# Patient Record
Sex: Male | Born: 1955 | Race: White | Hispanic: No | Marital: Single | Smoking: Current every day smoker
Health system: Southern US, Community
[De-identification: ages and names within clinical notes are randomized; demographics above are authoritative.]

## PROBLEM LIST (undated history)

## (undated) ENCOUNTER — Emergency Department: Discharge status: Absent without leave | Payer: Medicare Other

## (undated) DIAGNOSIS — I10 Essential (primary) hypertension: Secondary | ICD-10-CM

## (undated) DIAGNOSIS — Z22322 Carrier or suspected carrier of Methicillin resistant Staphylococcus aureus: Secondary | ICD-10-CM

## (undated) DIAGNOSIS — F32A Depression, unspecified: Secondary | ICD-10-CM

## (undated) DIAGNOSIS — M5030 Other cervical disc degeneration, unspecified cervical region: Secondary | ICD-10-CM

## (undated) DIAGNOSIS — K469 Unspecified abdominal hernia without obstruction or gangrene: Secondary | ICD-10-CM

## (undated) DIAGNOSIS — K219 Gastro-esophageal reflux disease without esophagitis: Secondary | ICD-10-CM

## (undated) DIAGNOSIS — E785 Hyperlipidemia, unspecified: Secondary | ICD-10-CM

## (undated) DIAGNOSIS — E119 Type 2 diabetes mellitus without complications: Secondary | ICD-10-CM

## (undated) DIAGNOSIS — R079 Chest pain, unspecified: Secondary | ICD-10-CM

## (undated) DIAGNOSIS — M6208 Separation of muscle (nontraumatic), other site: Secondary | ICD-10-CM

## (undated) DIAGNOSIS — K224 Dyskinesia of esophagus: Secondary | ICD-10-CM

## (undated) DIAGNOSIS — I2510 Atherosclerotic heart disease of native coronary artery without angina pectoris: Secondary | ICD-10-CM

## (undated) DIAGNOSIS — G629 Polyneuropathy, unspecified: Secondary | ICD-10-CM

## (undated) DIAGNOSIS — G894 Chronic pain syndrome: Secondary | ICD-10-CM

## (undated) DIAGNOSIS — I509 Heart failure, unspecified: Secondary | ICD-10-CM

## (undated) DIAGNOSIS — J449 Chronic obstructive pulmonary disease, unspecified: Secondary | ICD-10-CM

## (undated) HISTORY — DX: Heart failure, unspecified: I50.9

## (undated) HISTORY — DX: Carrier or suspected carrier of methicillin resistant Staphylococcus aureus: Z22.322

## (undated) HISTORY — DX: Polyneuropathy, unspecified: G62.9

## (undated) HISTORY — PX: TONSILLECTOMY: SUR1361

## (undated) HISTORY — DX: Other cervical disc degeneration, unspecified cervical region: M50.30

## (undated) HISTORY — DX: Gastro-esophageal reflux disease without esophagitis: K21.9

## (undated) HISTORY — DX: Chronic pain syndrome: G89.4

## (undated) HISTORY — DX: Separation of muscle (nontraumatic), other site: M62.08

## (undated) HISTORY — DX: Dyskinesia of esophagus: K22.4

## (undated) HISTORY — DX: Hyperlipidemia, unspecified: E78.5

## (undated) HISTORY — PX: FOOT SURGERY: SHX648

## (undated) HISTORY — PX: SPINE SURGERY: SHX786

## (undated) HISTORY — PX: CHOLECYSTECTOMY: SHX55

## (undated) HISTORY — PX: NECK SURGERY: SHX720

## (undated) HISTORY — DX: Unspecified abdominal hernia without obstruction or gangrene: K46.9

## (undated) HISTORY — DX: Essential (primary) hypertension: I10

## (undated) HISTORY — DX: Atherosclerotic heart disease of native coronary artery without angina pectoris: I25.10

## (undated) HISTORY — DX: Chest pain, unspecified: R07.9

---

## 2007-01-10 HISTORY — PX: CORONARY ANGIOPLASTY WITH STENT PLACEMENT: SHX49

## 2009-01-09 DIAGNOSIS — Z22322 Carrier or suspected carrier of Methicillin resistant Staphylococcus aureus: Secondary | ICD-10-CM

## 2009-01-09 HISTORY — DX: Carrier or suspected carrier of methicillin resistant Staphylococcus aureus: Z22.322

## 2009-03-16 ENCOUNTER — Inpatient Hospital Stay (HOSPITAL_COMMUNITY): Admission: EM | Admit: 2009-03-16 | Discharge: 2009-03-18 | Payer: Self-pay | Admitting: Emergency Medicine

## 2009-03-16 ENCOUNTER — Ambulatory Visit: Payer: Self-pay | Admitting: Internal Medicine

## 2009-03-17 ENCOUNTER — Ambulatory Visit: Payer: Self-pay | Admitting: Gastroenterology

## 2009-03-22 ENCOUNTER — Emergency Department (HOSPITAL_COMMUNITY): Admission: EM | Admit: 2009-03-22 | Discharge: 2009-03-23 | Payer: Self-pay | Admitting: Emergency Medicine

## 2009-03-23 ENCOUNTER — Inpatient Hospital Stay (HOSPITAL_COMMUNITY): Admission: EM | Admit: 2009-03-23 | Discharge: 2009-03-25 | Payer: Self-pay | Admitting: Emergency Medicine

## 2009-03-24 ENCOUNTER — Encounter (INDEPENDENT_AMBULATORY_CARE_PROVIDER_SITE_OTHER): Payer: Self-pay | Admitting: Internal Medicine

## 2009-03-24 ENCOUNTER — Ambulatory Visit: Payer: Self-pay | Admitting: Physical Medicine & Rehabilitation

## 2009-03-24 ENCOUNTER — Encounter: Payer: Self-pay | Admitting: Internal Medicine

## 2009-03-28 ENCOUNTER — Emergency Department (HOSPITAL_COMMUNITY): Admission: EM | Admit: 2009-03-28 | Discharge: 2009-03-29 | Payer: Self-pay | Admitting: Emergency Medicine

## 2009-03-29 ENCOUNTER — Emergency Department (HOSPITAL_COMMUNITY): Admission: EM | Admit: 2009-03-29 | Discharge: 2009-03-29 | Payer: Self-pay | Admitting: Emergency Medicine

## 2009-03-30 ENCOUNTER — Encounter: Payer: Self-pay | Admitting: Internal Medicine

## 2009-03-30 DIAGNOSIS — I2510 Atherosclerotic heart disease of native coronary artery without angina pectoris: Secondary | ICD-10-CM | POA: Insufficient documentation

## 2009-03-30 DIAGNOSIS — Z8679 Personal history of other diseases of the circulatory system: Secondary | ICD-10-CM | POA: Insufficient documentation

## 2009-03-30 DIAGNOSIS — E785 Hyperlipidemia, unspecified: Secondary | ICD-10-CM | POA: Insufficient documentation

## 2009-04-02 ENCOUNTER — Observation Stay (HOSPITAL_COMMUNITY): Admission: EM | Admit: 2009-04-02 | Discharge: 2009-04-03 | Payer: Self-pay | Admitting: Emergency Medicine

## 2009-04-08 ENCOUNTER — Encounter (INDEPENDENT_AMBULATORY_CARE_PROVIDER_SITE_OTHER): Payer: Self-pay | Admitting: *Deleted

## 2009-04-12 ENCOUNTER — Emergency Department (HOSPITAL_COMMUNITY): Admission: EM | Admit: 2009-04-12 | Discharge: 2009-04-12 | Payer: Self-pay | Admitting: Emergency Medicine

## 2009-04-13 ENCOUNTER — Emergency Department (HOSPITAL_COMMUNITY): Admission: EM | Admit: 2009-04-13 | Discharge: 2009-04-13 | Payer: Self-pay | Admitting: Emergency Medicine

## 2009-04-14 ENCOUNTER — Inpatient Hospital Stay (HOSPITAL_COMMUNITY): Admission: EM | Admit: 2009-04-14 | Discharge: 2009-04-16 | Payer: Self-pay | Admitting: Emergency Medicine

## 2009-04-16 ENCOUNTER — Emergency Department (HOSPITAL_COMMUNITY): Admission: EM | Admit: 2009-04-16 | Discharge: 2009-04-17 | Payer: Self-pay | Admitting: Emergency Medicine

## 2009-04-17 ENCOUNTER — Ambulatory Visit: Payer: Self-pay | Admitting: Psychiatry

## 2009-04-17 ENCOUNTER — Inpatient Hospital Stay (HOSPITAL_COMMUNITY)
Admission: RE | Admit: 2009-04-17 | Discharge: 2009-04-26 | Payer: Self-pay | Source: Home / Self Care | Admitting: Psychiatry

## 2009-04-17 ENCOUNTER — Emergency Department (HOSPITAL_COMMUNITY): Admission: EM | Admit: 2009-04-17 | Discharge: 2009-04-17 | Payer: Self-pay | Admitting: Emergency Medicine

## 2009-04-17 ENCOUNTER — Other Ambulatory Visit: Payer: Self-pay | Admitting: Emergency Medicine

## 2009-05-27 ENCOUNTER — Emergency Department: Payer: Self-pay | Admitting: Emergency Medicine

## 2009-08-27 ENCOUNTER — Emergency Department: Payer: Self-pay | Admitting: Emergency Medicine

## 2009-10-05 ENCOUNTER — Ambulatory Visit: Payer: Self-pay

## 2009-10-13 ENCOUNTER — Emergency Department: Payer: Self-pay | Admitting: Emergency Medicine

## 2009-10-15 ENCOUNTER — Emergency Department: Payer: Self-pay | Admitting: Emergency Medicine

## 2009-10-30 ENCOUNTER — Ambulatory Visit: Payer: Self-pay | Admitting: Cardiovascular Disease

## 2009-10-30 ENCOUNTER — Inpatient Hospital Stay: Payer: Self-pay | Admitting: Internal Medicine

## 2009-10-31 ENCOUNTER — Encounter: Payer: Self-pay | Admitting: Cardiology

## 2009-11-11 ENCOUNTER — Encounter: Payer: Self-pay | Admitting: Cardiovascular Disease

## 2009-11-24 ENCOUNTER — Ambulatory Visit: Payer: Self-pay | Admitting: Cardiovascular Disease

## 2009-11-24 DIAGNOSIS — R0989 Other specified symptoms and signs involving the circulatory and respiratory systems: Secondary | ICD-10-CM | POA: Insufficient documentation

## 2009-11-24 DIAGNOSIS — R079 Chest pain, unspecified: Secondary | ICD-10-CM | POA: Insufficient documentation

## 2009-11-25 DIAGNOSIS — F17200 Nicotine dependence, unspecified, uncomplicated: Secondary | ICD-10-CM | POA: Insufficient documentation

## 2009-12-09 ENCOUNTER — Ambulatory Visit: Payer: Self-pay

## 2009-12-09 ENCOUNTER — Encounter: Payer: Self-pay | Admitting: Cardiovascular Disease

## 2009-12-15 ENCOUNTER — Telehealth: Payer: Self-pay | Admitting: Cardiovascular Disease

## 2010-01-09 HISTORY — PX: BACK SURGERY: SHX140

## 2010-01-26 ENCOUNTER — Telehealth: Payer: Self-pay | Admitting: Cardiovascular Disease

## 2010-01-27 ENCOUNTER — Encounter: Payer: Self-pay | Admitting: Cardiovascular Disease

## 2010-02-08 NOTE — Consult Note (Signed)
Summary[facility]ist, [facility]   [facility]   Imported By: Roderic Ovens 11/05/2009 16:11:08  _____________________________________________________________________  External Attachment:    Type:   Image     Comment:   External Document

## 2010-02-08 NOTE — Letter (Signed)
Summary: Patient [name] Biopsy Results  [facility]  [address] [place], [place] [address]   Phone: [phone]  Fax: [phone]        March 30, 2009 MRN: [id number]    Gregory Hall [address] [place], [place]  [address]    Dear [name],  I am pleased to inform you that the biopsies taken during your recent endoscopic examination did not show any evidence of cancer upon pathologic examination.  No further action is needed at this time.  Please follow-up with      your primary care physician and Dr. Alycia Rossetti for your other healthcare needs.  Please call us if you are having persistent problems or have questions about your condition that have not been fully answered at this time.  Sincerely,  Iva Boop MD, [facility]  This letter has been electronically signed by your physician.  Appended Document: Patient Notice-Endo Biopsy Results letter mailed to patient's home    Appended Document: Patient Notice-Endo Biopsy Results LETTER MAILED AGAIN WITH CORRECT ZIP.

## 2010-02-08 NOTE — Procedures (Signed)
Summary: Upper Endoscopy  Patient: Gregory Hall Note: All result statuses are Final unless otherwise noted.  Tests: (1) Upper Endoscopy (EGD)   EGD Upper Endoscopy       DONE     [place] [facility]     [address]     [place], [place]  [address]           ENDOSCOPY PROCEDURE REPORT           PATIENT:  Gregory, Hall  MR#:  [id number]     BIRTHDATE:  [birth year], 54 yrs. old  GENDER:  male           ENDOSCOPIST:  Iva Boop, MD, [facility]     Referred by:  [facility]           PROCEDURE DATE:  03/24/2009     PROCEDURE:  EGD with biopsy     ASA CLASS:  Class III     INDICATIONS:  hematemesis           MEDICATIONS:   Benadryl 12.5 mg IV, Fentanyl 100 mcg IV, Versed 8     mg IV     TOPICAL ANESTHETIC:  Cetacaine Spray           DESCRIPTION OF PROCEDURE:   After the risks benefits and     alternatives of the procedure were thoroughly explained, informed     consent was obtained.  The EG-[id number] ([id number]) endoscope was     introduced through the mouth and advanced to the second portion of     the duodenum, without limitations.  The instrument was slowly     withdrawn as the mucosa was fully examined.     <<PROCEDUREIMAGES>>           Multiple erosions were found pyloric channel Erythema in pylorus     and pre-pyloric antrum also. Multiple biopsies were obtained and     sent to pathology.  Otherwise the examination was normal.     Retroflexed views revealed no abnormalities.    The scope was then     withdrawn from the patient and the procedure completed.           COMPLICATIONS:  None           ENDOSCOPIC IMPRESSION:     1) Erosions, multiple in the pyloric channel     2) Otherwise normal examination     RECOMMENDATIONS:     Would not change therapy at this time as far as erosions are     concerned.           He has 3 main pains:     1) Spastic chest pain that is wave like and could be from the     previously diagnosed nutcracker esophagus     2)  Chest, left neck and left shoulder pains associated with     upper extremity paresthesias that could be fro C-spine problems     (do not think this has been evaluated here and it is appropriate     to do so to help avoid repeated admissions)     3) abdomional wall pain from vomiting           He cannot take NSAIDs on Plavix though he has in past. ? if     topical NSAID therapy an option if he could afford.     A low-dose tricycli agent is also a possibility but follow-up     plans  would need to be in place before starting.           He is to see Dr. Alycia Rossetti at [facility] later this month and should     do so.           Iva Boop, MD, [facility]           CC:  The Patient     Beverly [name], MD [facility])           n.     Rosalie Doctor:   Iva Boop at 03/24/2009 12:19 PM           Glennon Mac, [id number]  Note: An exclamation mark (!) indicates a result that was not dispersed into the flowsheet. Document Creation Date: 03/24/2009 12:20 PM _______________________________________________________________________  (1) Order result status: Final Collection or observation date-time: 03/24/2009 11:57 Requested date-time:  Receipt date-time:  Reported date-time:  Referring Physician:   Ordering Physician: Stan Head [phone]) Specimen Source:  Source: Launa Grill Order Number: [id number] Lab site:   Appended Document: Upper Endoscopy no recall for this fax the EGD report, dc summary and path report to Dr. Alycia Rossetti at [facility]

## 2010-02-08 NOTE — Assessment & Plan Note (Signed)
Summary: [facility]   Visit Type:  Initial Consult Primary Provider:  Dr. Lacie Scotts  CC:  F/U [facility].  He went to the hospital with right leg pain with esophageal spasms and was admitted.  He has shortness of breath and  being treated for MRSA.Marland Kitchen  History of Present Illness: 55 yo male with CAD, PCI of the LAD in 2010, repeat cath in 03/2009, hyperlipidemia, HTN, psychiatric hx, long smoking hx, recent treatment for MRSA, presenting for new patient evaluation. He was seen in consultation [facility] for chest pain 10/30/1009.  His chest pain at [facility] was felt to be noncardiac. He has a h/o of significant esophageal pathology and spasm with erosive gastritis/GERD per his report. He has not had a GI workup since he moved to the area.   He denies any further chest pain. He has been having trouble with his rash and had recent biopsy.   EKG: NSR with rate of 84bpm, T wave ABN in anterolateral and inferior leads.    Current Medications (verified): 1)  Aspir-Low 81 Mg Tbec (Aspirin) .... One Tablet Once Daily 2)  Lisinopril 5 Mg Tabs (Lisinopril) .... One Tablet Q A.m. 3)  Metoprolol Tartrate 25 Mg Tabs (Metoprolol Tartrate) .... One Tablet Two Times A Day 4)  Valium 10 Mg Tabs (Diazepam) .... One Tablet Three Times A Day 5)  Zocor 20 Mg Tabs (Simvastatin) .... One Tablet At Bedtime 6)  Benadryl 25 Mg Tabs (Diphenhydramine Hcl) .... One Tablet Every 8 Hours As Needed 7)  Nexium 40 Mg Cpdr (Esomeprazole Magnesium) .... One Tablet Two Times A Day 8)  Nitrostat 0.4 Mg Subl (Nitroglycerin) .... As Needed 9)  Doxycycline Hyclate 100 Mg Tabs (Doxycycline Hyclate) .... One Tablet  Two Times A Day For 7 Days.  Was Given A Rx For 30 Day Supply. 10)  Flexeril 5 Mg Tabs (Cyclobenzaprine Hcl) .... One Tablet Two Times A Day 11)  Zyrtec Allergy 10 Mg Tabs (Cetirizine Hcl) .... One Tablet Once Daily 12)  Neurontin 300 Mg Caps (Gabapentin) .... One Tablet Three Times A Day 13)  Lasix 20 Mg Tabs (Furosemide) ....  One Tablet As Needed 14)  Trazodone Hcl 150 Mg Tabs (Trazodone Hcl) .... One Tablet At Bedtime 15)  Celexa 20 Mg Tabs (Citalopram Hydrobromide) .... One Tablet Once Daily 16)  Amitriptyline Hcl 75 Mg Tabs (Amitriptyline Hcl) .... One Tablet At Bedtime 17)  Hydroxyzine Hcl 25 Mg Tabs (Hydroxyzine Hcl) 18)  Ventolin Hfa 108 (90 Base) Mcg/act Aers (Albuterol Sulfate) .... Two Puffs Every 6 Hours As Needed 19)  Oxycodone Hcl 10 Mg Tabs (Oxycodone Hcl) .... Two Tablets At 8 A.m. One Tablet At 2 P.m. and One Tablet 8 P.m. 20)  Triamcinolone Acetonide 0.1 % Crea (Triamcinolone Acetonide) .... Two Times A Day 21)  Hydrocortisone 2.5 % Crea (Hydrocortisone) .... Two Times A Day  Allergies (verified): No Known Drug Allergies  Past History:  Family History: Last updated: 03/30/2009 Family History of Coronary Artery Disease:   Social History: Last updated: 11/24/2009 Tobacco Use - No.  Tobacco Use - Yes. 1/2 PPD. Alcohol Use - no Drug Use - no  Risk Factors: Smoking Status: quit (03/30/2009)  Past Medical History:  CONSULTATIONS:  The patient was seen in consult by Dr. Leone Payor from   [facility].  Also Ranelle Oyster, M.D. from [facility].   -------------------------------------------------------------------------------------------------------    History of coronary artery disease status post percutaneous      transluminal coronary angioplasty    History of hypertension  History of dyslipidemia    History of chronic pain syndrome    History of nutcracker esophagus    History of gastroesophageal reflux disease   History of degenerative cervical disk disease MRSA 2011. CAD; s/p stent x 2        Past Surgical History: CAD; s/p stents x 2 in 2009 in [place], [place]. Tonsillectomy Cholecystectomy Previous right foot surgery   Social History: Tobacco Use - No.  Tobacco Use - Yes. 1/2 PPD. Alcohol Use - no Drug Use - no  Review of Systems  The patient denies  fever, weight loss, weight gain, vision loss, decreased hearing, hoarseness, chest pain, syncope, dyspnea on exertion, peripheral edema, prolonged cough, abdominal pain, incontinence, muscle weakness, depression, and enlarged lymph nodes.         rash  Vital Signs:  Patient profile:   55 year old male Height:      73 inches Weight:      213 pounds BMI:     28.20 Pulse rate:   84 / minute BP sitting:   102 / 58  (left arm) Cuff size:   regular  Vitals Entered By: Bishop Dublin, CMA (November 24, 2009 11:06 AM)  Physical Exam  General:  Well developed, well nourished, in no acute distress. Head:  normocephalic and atraumatic Neck:  Neck supple, no JVD. No masses, thyromegaly or abnormal cervical nodes. Lungs:  Clear bilaterally to auscultation and percussion. Heart:  Non-displaced PMI, chest non-tender; regular rate and rhythm, S1, S2 without murmurs, rubs or gallops. Carotid upstroke normal, 1+ bruit on the right.  Pedals normal pulses. No edema, no varicosities. Abdomen:  Bowel sounds positive; abdomen soft and non-tender without masses Msk:  Back normal, normal gait. Muscle strength and tone normal. Pulses:  pulses normal in all 4 extremities Extremities:  No clubbing or cyanosis. Neurologic:  Alert and oriented x 3. Skin:  Macular diffuse rash Psych:  Normal affect.   Impression & Recommendations:  Problem # 1:  CHEST PAIN-UNSPECIFIED (ICD-786.50) Suspect his chest discomfort could be secondary to GERD/GI related.  Possible spasm as NTG seems to help. Can not exclude small vessel disease. will start low dose imdur 15 mg daily, possibly titrating to 30 mg if tolerated.    His updated medication list for this problem includes:    Aspir-low 81 Mg Tbec (Aspirin) ..... One tablet once daily    Lisinopril 5 Mg Tabs (Lisinopril) ..... One tablet q a.m.    Metoprolol Tartrate 25 Mg Tabs (Metoprolol tartrate) ..... One tablet two times a day    Nitrostat 0.4 Mg Subl  (Nitroglycerin) .Marland Kitchen... As needed    Isosorbide Mononitrate Cr 30 Mg Xr24h-tab (Isosorbide mononitrate) .Marland Kitchen... Take 1/2 tablet once daily  Problem # 2:  CAROTID BRUIT, RIGHT (ICD-785.9) Will order a carotid ultrasound for evaluation. Bruit on the right.  Orders: Carotid Duplex (Carotid Duplex)  Problem # 3:  DYSLIPIDEMIA (ICD-272.4) Continue zocor. Goal LDL 70.  His updated medication list for this problem includes:    Zocor 20 Mg Tabs (Simvastatin) ..... One tablet at bedtime  Problem # 4:  CORONARY ARTERY DISEASE (ICD-414.00) Patent stent by cardiac cath in 03/2009. medical magagement.  His updated medication list for this problem includes:    Aspir-low 81 Mg Tbec (Aspirin) ..... One tablet once daily    Lisinopril 5 Mg Tabs (Lisinopril) ..... One tablet q a.m.    Metoprolol Tartrate 25 Mg Tabs (Metoprolol tartrate) ..... One tablet two times a day    Nitrostat  0.4 Mg Subl (Nitroglycerin) .Marland Kitchen... As needed    Isosorbide Mononitrate Cr 30 Mg Xr24h-tab (Isosorbide mononitrate) .Marland Kitchen... Take 1/2 tablet once daily  Orders: EKG w/ Interpretation (93000)  Problem # 5:  SMOKER (ICD-305.1)  We have encouraged him to continue to wean down off the cigarettes.  Explained how this could exacerbate his underlying CAD.  Patient Instructions: 1)  Your physician recommends that you schedule a follow-up appointment in: 1 year 2)  Your physician has recommended you make the following change in your medication: Start taking Isosorbide Mononitrate 30mg  1/2 tablet daily. 3)  Your physician has requested that you have a carotid duplex. This test is an ultrasound of the carotid arteries in your neck. It looks at blood flow through these arteries that supply the brain with blood. Allow one hour for this exam. There are no restrictions or special instructions. In the next 1-2 months. Prescriptions: ISOSORBIDE MONONITRATE CR 30 MG XR24H-TAB (ISOSORBIDE MONONITRATE) Take 1/2 tablet once daily  #30 x 6    Entered by:   Cloyde Reams RN   Authorized by:   Dossie Arbour MD   Signed by:   Cloyde Reams RN on 11/24/2009   Method used:   Electronically to        [facility] (retail)       [address]       [place], [place]  [address]       Ph: [phone]       Fax: [phone]   RxID:   [id number]

## 2010-02-08 NOTE — Letter (Signed)
Summary: Appointment - Missed  [facility], Main Office  [address]   [place], [place] [address]   Phone: [phone]  Fax: [phone]     April 08, 2009 MRN: [id number]   Gregory Hall [address] [place], [place]  [address]   Dear Mr. [name],  Our records indicate you missed your appointment on 03/31/2009 with Dr.  Shirlee Latch. It is very important that we reach you to reschedule this appointment. We look forward to participating in your health care needs. Please contact us at the number listed above at your earliest convenience to reschedule this appointment.     Sincerely,   [facility]igdalia [facility] Scheduling Team

## 2010-02-10 NOTE — Letter (Signed)
Summary: Clearance for medication  Clearance for medication   Imported By: Harlon Flor 01/28/2010 16:22:41  _____________________________________________________________________  External Attachment:    Type:   Image     Comment:   External Document

## 2010-02-10 NOTE — Progress Notes (Signed)
Summary: pt has question re when he can have back surgery  Phone Note Call from Patient Call back at Home Phone [phone] Call back at [phone]   Caller: Patient Reason for Call: Talk to Nurse Summary of Call: pt was told by dr Eden Emms to wait 90 days before getting his back surgery done, and he forgot to ask 90 days from what date, mrsa better has one scab left, now has scabies and being treated for that now-pls advise Initial call taken by: Glynda Jaeger,  December 15, 2009 1:36 PM  Follow-up for Phone Call        spoke with pt, dr Eden Emms had seen the pt in consult at [facility] and then he followed up with dr Raynald Kemp. the pt states the last thing dr Eden Emms said before he left is the pt would need to wait 90 days before any surgery. the pt questioned if that was because of the MRSA or the plavix. will foward for dr Eden Emms review Deliah Goody, RN  December 15, 2009 3:59 PM   Additional Follow-up for Phone Call Additional follow up Details #1::        I suspect it was because of the MRSA but not sure Dr Mariah Milling follows this patient and will need to figure this out Additional Follow-up by: Colon Branch, MD, [facility],  December 16, 2009 10:42 AM    Additional Follow-up for Phone Call Additional follow up Details #2::    pt made aware Deliah Goody, RN  December 16, 2009 3:40 PM    Appended Document: pt has question re when he can have back surgery He can stop ASA 10 days before back surgery. Is he on plavix? Not on our list? If he is on plavix, this would also need to be stopped 10 day before surgery  Appended Document: pt has question re when he can have back surgery pt notified to stop ASA 10 days prior to back surgery. pt states he is not on plavix.

## 2010-02-10 NOTE — Progress Notes (Signed)
Summary: Surgical Clearance  Phone Note Call from Patient Call back at Home Phone [phone] Call back at [phone]   Caller: Self  Call For: Gregory Hall Summary of Call: Pt is having back surgery and needs surgical clearance and stating that the pt cannot have aspirin for 10 days prior to the surgery.  Please send to Dr. Gerrit Heck at [facility]. Initial call taken by: Harlon Flor,  January 26, 2010 10:29 AM  Follow-up for Phone Call        Please advise. Follow-up by: Lanny Hurst RN,  January 26, 2010 4:05 PM  Additional Follow-up for Phone Call Additional follow up Details #1::        Ok to hold asa for 10 days. Would restart when surgery complete. Can we get a clearance letter to Dr. Gerrit Heck     Appended Document: Surgical Clearance Clearance letter sent to Dr. Gerrit Heck. /MES

## 2010-02-10 NOTE — Letter (Signed)
Summary: Clearance Letter  Architectural technologist at [facility] [address]   [place], [place] [address]   Phone: [phone]  Fax: [phone]    January 27, 2010  Re:     Gregory Hall Address:   [address] [place], [place]  [address] DOB:     09-08-1955 MRN:     [id number]   Dear Dr. Gerrit Heck,    [facility] for patient to hold Aspirin for 10 days prior to surgery, and restart when surgery completed.           Sincerely,        Dossie Arbour, MD

## 2010-02-15 ENCOUNTER — Ambulatory Visit: Payer: Self-pay | Admitting: Unknown Physician Specialty

## 2010-02-22 ENCOUNTER — Ambulatory Visit: Payer: Self-pay | Admitting: Unknown Physician Specialty

## 2010-03-30 LAB — COMPREHENSIVE METABOLIC PANEL
ALT: 10 U/L (ref 0–53)
AST: 12 U/L (ref 0–37)
Albumin: 2.9 g/dL — ABNORMAL LOW (ref 3.5–5.2)
Alkaline Phosphatase: 58 U/L (ref 39–117)
BUN: 5 mg/dL — ABNORMAL LOW (ref 6–23)
CO2: 28 mEq/L (ref 19–32)
Calcium: 8.4 mg/dL (ref 8.4–10.5)
Chloride: 103 mEq/L (ref 96–112)
Creatinine, Ser: 0.84 mg/dL (ref 0.4–1.5)
GFR calc Af Amer: >60 mL/min (ref >60)
GFR calc non Af Amer: >60 mL/min (ref >60)
Glucose, Bld: 101 mg/dL — ABNORMAL HIGH (ref 70–99)
Potassium: 4 mEq/L (ref 3.5–5.1)
Sodium: 136 mEq/L (ref 135–145)
Total Bilirubin: 0.4 mg/dL (ref 0.3–1.2)
Total Protein: 5.3 g/dL — ABNORMAL LOW (ref 6.0–8.3)

## 2010-03-30 LAB — POCT I-STAT, CHEM 8
BUN: 4 mg/dL — ABNORMAL LOW (ref 6–23)
BUN: <3 mg/dL — ABNORMAL LOW (ref 6–23)
Calcium, Ion: 1.04 mmol/L — ABNORMAL LOW (ref 1.12–1.32)
Calcium, Ion: 1.14 mmol/L (ref 1.12–1.32)
Chloride: 101 mEq/L (ref 96–112)
Chloride: 106 mEq/L (ref 96–112)
Creatinine, Ser: 0.7 mg/dL (ref 0.4–1.5)
Creatinine, Ser: 0.7 mg/dL (ref 0.4–1.5)
Glucose, Bld: 126 mg/dL — ABNORMAL HIGH (ref 70–99)
Glucose, Bld: 168 mg/dL — ABNORMAL HIGH (ref 70–99)
HCT: 42 % (ref 39.0–52.0)
HCT: 45 % (ref 39.0–52.0)
Hemoglobin: 14.3 g/dL (ref 13.0–17.0)
Hemoglobin: 15.3 g/dL (ref 13.0–17.0)
Potassium: 2.9 mEq/L — ABNORMAL LOW (ref 3.5–5.1)
Potassium: 3.2 mEq/L — ABNORMAL LOW (ref 3.5–5.1)
Sodium: 140 mEq/L (ref 135–145)
Sodium: 143 mEq/L (ref 135–145)
TCO2: 26 mmol/L (ref 0–100)
TCO2: 29 mmol/L (ref 0–100)

## 2010-03-30 LAB — RAPID URINE DRUG SCREEN, HOSP PERFORMED
Amphetamines: NOT DETECTED
Barbiturates: NOT DETECTED
Benzodiazepines: POSITIVE — AB
Cocaine: NOT DETECTED
Opiates: POSITIVE — AB
Tetrahydrocannabinol: NOT DETECTED

## 2010-03-30 LAB — BASIC METABOLIC PANEL
BUN: 8 mg/dL (ref 6–23)
CO2: 28 mEq/L (ref 19–32)
Calcium: 9 mg/dL (ref 8.4–10.5)
Chloride: 100 mEq/L (ref 96–112)
Creatinine, Ser: 1.03 mg/dL (ref 0.4–1.5)
GFR calc Af Amer: >60 mL/min (ref >60)
GFR calc non Af Amer: >60 mL/min (ref >60)
Glucose, Bld: 178 mg/dL — ABNORMAL HIGH (ref 70–99)
Potassium: 4 mEq/L (ref 3.5–5.1)
Sodium: 135 mEq/L (ref 135–145)

## 2010-03-30 LAB — DIFFERENTIAL
Basophils Absolute: 0 10*3/uL (ref 0.0–0.1)
Basophils Absolute: 0.1 10*3/uL (ref 0.0–0.1)
Basophils Relative: 0 % (ref 0–1)
Basophils Relative: 1 % (ref 0–1)
Eosinophils Absolute: 0.1 10*3/uL (ref 0.0–0.7)
Eosinophils Absolute: 0.2 10*3/uL (ref 0.0–0.7)
Eosinophils Relative: 1 % (ref 0–5)
Eosinophils Relative: 2 % (ref 0–5)
Lymphocytes Relative: 14 % (ref 12–46)
Lymphocytes Relative: 21 % (ref 12–46)
Lymphs Abs: 1.2 10*3/uL (ref 0.7–4.0)
Lymphs Abs: 2.6 10*3/uL (ref 0.7–4.0)
Monocytes Absolute: 0.6 10*3/uL (ref 0.1–1.0)
Monocytes Absolute: 1.1 10*3/uL — ABNORMAL HIGH (ref 0.1–1.0)
Monocytes Relative: 8 % (ref 3–12)
Monocytes Relative: 9 % (ref 3–12)
Neutro Abs: 6.4 10*3/uL (ref 1.7–7.7)
Neutro Abs: 8.3 10*3/uL — ABNORMAL HIGH (ref 1.7–7.7)
Neutrophils Relative %: 68 % (ref 43–77)
Neutrophils Relative %: 77 % (ref 43–77)

## 2010-03-30 LAB — POCT CARDIAC MARKERS
CKMB, poc: 1.2 ng/mL (ref 1.0–8.0)
CKMB, poc: <1 ng/mL — ABNORMAL LOW (ref 1.0–8.0)
CKMB, poc: <1 ng/mL — ABNORMAL LOW (ref 1.0–8.0)
CKMB, poc: <1 ng/mL — ABNORMAL LOW (ref 1.0–8.0)
Myoglobin, poc: 40.5 ng/mL (ref 12–200)
Myoglobin, poc: 40.5 ng/mL (ref 12–200)
Myoglobin, poc: 55.2 ng/mL (ref 12–200)
Myoglobin, poc: 90.7 ng/mL (ref 12–200)
Troponin i, poc: <0.05 ng/mL (ref 0.00–0.09)
Troponin i, poc: <0.05 ng/mL (ref 0.00–0.09)
Troponin i, poc: <0.05 ng/mL (ref 0.00–0.09)
Troponin i, poc: <0.05 ng/mL (ref 0.00–0.09)

## 2010-03-30 LAB — CARDIAC PANEL(CRET KIN+CKTOT+MB+TROPI)
CK, MB: 1.3 ng/mL (ref 0.3–4.0)
CK, MB: 1.6 ng/mL (ref 0.3–4.0)
Relative Index: INVALID (ref 0.0–2.5)
Relative Index: INVALID (ref 0.0–2.5)
Total CK: 43 U/L (ref 7–232)
Total CK: 45 U/L (ref 7–232)
Troponin I: <0.01 ng/mL (ref 0.00–0.06)
Troponin I: <0.01 ng/mL (ref 0.00–0.06)

## 2010-03-30 LAB — LIPID PANEL
Cholesterol: 108 mg/dL (ref 0–200)
HDL: 25 mg/dL — ABNORMAL LOW (ref >39)
LDL Cholesterol: 65 mg/dL (ref 0–99)
Total CHOL/HDL Ratio: 4.3 RATIO
Triglycerides: 88 mg/dL (ref <150)
VLDL: 18 mg/dL (ref 0–40)

## 2010-03-30 LAB — CBC
HCT: 37.4 % — ABNORMAL LOW (ref 39.0–52.0)
HCT: 41.4 % (ref 39.0–52.0)
HCT: 42.7 % (ref 39.0–52.0)
Hemoglobin: 12.3 g/dL — ABNORMAL LOW (ref 13.0–17.0)
Hemoglobin: 13.9 g/dL (ref 13.0–17.0)
Hemoglobin: 14.6 g/dL (ref 13.0–17.0)
MCHC: 33 g/dL (ref 30.0–36.0)
MCHC: 33.6 g/dL (ref 30.0–36.0)
MCHC: 34.3 g/dL (ref 30.0–36.0)
MCV: 93 fL (ref 78.0–100.0)
MCV: 93.2 fL (ref 78.0–100.0)
MCV: 94 fL (ref 78.0–100.0)
Platelets: 201 10*3/uL (ref 150–400)
Platelets: 234 10*3/uL (ref 150–400)
Platelets: 259 10*3/uL (ref 150–400)
RBC: 3.98 MIL/uL — ABNORMAL LOW (ref 4.22–5.81)
RBC: 4.44 MIL/uL (ref 4.22–5.81)
RBC: 4.59 MIL/uL (ref 4.22–5.81)
RDW: 14 % (ref 11.5–15.5)
RDW: 14.1 % (ref 11.5–15.5)
RDW: 14.2 % (ref 11.5–15.5)
WBC: 12.3 10*3/uL — ABNORMAL HIGH (ref 4.0–10.5)
WBC: 5.3 10*3/uL (ref 4.0–10.5)
WBC: 8.3 10*3/uL (ref 4.0–10.5)

## 2010-03-30 LAB — ETHANOL: Alcohol, Ethyl (B): <5 mg/dL (ref 0–10)

## 2010-03-30 LAB — TSH: TSH: 1.855 u[IU]/mL (ref 0.350–4.500)

## 2010-03-30 LAB — T4, FREE: Free T4: 1.27 ng/dL (ref 0.80–1.80)

## 2010-04-04 LAB — BASIC METABOLIC PANEL
BUN: 5 mg/dL — ABNORMAL LOW (ref 6–23)
BUN: 5 mg/dL — ABNORMAL LOW (ref 6–23)
BUN: 5 mg/dL — ABNORMAL LOW (ref 6–23)
BUN: 8 mg/dL (ref 6–23)
CO2: 26 mEq/L (ref 19–32)
CO2: 26 mEq/L (ref 19–32)
CO2: 26 mEq/L (ref 19–32)
CO2: 29 mEq/L (ref 19–32)
Calcium: 7.9 mg/dL — ABNORMAL LOW (ref 8.4–10.5)
Calcium: 8.2 mg/dL — ABNORMAL LOW (ref 8.4–10.5)
Calcium: 8.8 mg/dL (ref 8.4–10.5)
Calcium: 9.3 mg/dL (ref 8.4–10.5)
Chloride: 104 mEq/L (ref 96–112)
Chloride: 105 mEq/L (ref 96–112)
Chloride: 107 mEq/L (ref 96–112)
Chloride: 108 mEq/L (ref 96–112)
Creatinine, Ser: 0.64 mg/dL (ref 0.4–1.5)
Creatinine, Ser: 0.68 mg/dL (ref 0.4–1.5)
Creatinine, Ser: 0.7 mg/dL (ref 0.4–1.5)
Creatinine, Ser: 0.84 mg/dL (ref 0.4–1.5)
GFR calc Af Amer: >60 mL/min (ref >60)
GFR calc Af Amer: >60 mL/min (ref >60)
GFR calc Af Amer: >60 mL/min (ref >60)
GFR calc Af Amer: >60 mL/min (ref >60)
GFR calc non Af Amer: >60 mL/min (ref >60)
GFR calc non Af Amer: >60 mL/min (ref >60)
GFR calc non Af Amer: >60 mL/min (ref >60)
GFR calc non Af Amer: >60 mL/min (ref >60)
Glucose, Bld: 130 mg/dL — ABNORMAL HIGH (ref 70–99)
Glucose, Bld: 132 mg/dL — ABNORMAL HIGH (ref 70–99)
Glucose, Bld: 144 mg/dL — ABNORMAL HIGH (ref 70–99)
Glucose, Bld: 93 mg/dL (ref 70–99)
Potassium: 3.2 mEq/L — ABNORMAL LOW (ref 3.5–5.1)
Potassium: 3.3 mEq/L — ABNORMAL LOW (ref 3.5–5.1)
Potassium: 3.4 mEq/L — ABNORMAL LOW (ref 3.5–5.1)
Potassium: 3.6 mEq/L (ref 3.5–5.1)
Sodium: 137 mEq/L (ref 135–145)
Sodium: 138 mEq/L (ref 135–145)
Sodium: 139 mEq/L (ref 135–145)
Sodium: 140 mEq/L (ref 135–145)

## 2010-04-04 LAB — CARDIAC PANEL(CRET KIN+CKTOT+MB+TROPI)
CK, MB: 2.3 ng/mL (ref 0.3–4.0)
CK, MB: 2.6 ng/mL (ref 0.3–4.0)
CK, MB: 3.1 ng/mL (ref 0.3–4.0)
CK, MB: 3.4 ng/mL (ref 0.3–4.0)
CK, MB: 4.9 ng/mL — ABNORMAL HIGH (ref 0.3–4.0)
Relative Index: 1.9 (ref 0.0–2.5)
Relative Index: 2 (ref 0.0–2.5)
Relative Index: 2.1 (ref 0.0–2.5)
Relative Index: 3.1 — ABNORMAL HIGH (ref 0.0–2.5)
Relative Index: INVALID (ref 0.0–2.5)
Total CK: 138 U/L (ref 7–232)
Total CK: 153 U/L (ref 7–232)
Total CK: 157 U/L (ref 7–232)
Total CK: 159 U/L (ref 7–232)
Total CK: 67 U/L (ref 7–232)
Troponin I: 0.01 ng/mL (ref 0.00–0.06)
Troponin I: 0.01 ng/mL (ref 0.00–0.06)
Troponin I: 0.02 ng/mL (ref 0.00–0.06)
Troponin I: 0.02 ng/mL (ref 0.00–0.06)
Troponin I: <0.01 ng/mL (ref 0.00–0.06)

## 2010-04-04 LAB — CBC
HCT: 36.4 % — ABNORMAL LOW (ref 39.0–52.0)
HCT: 37.1 % — ABNORMAL LOW (ref 39.0–52.0)
HCT: 37.8 % — ABNORMAL LOW (ref 39.0–52.0)
HCT: 38 % — ABNORMAL LOW (ref 39.0–52.0)
HCT: 39.1 % (ref 39.0–52.0)
HCT: 40 % (ref 39.0–52.0)
HCT: 40.7 % (ref 39.0–52.0)
HCT: 41 % (ref 39.0–52.0)
HCT: 42.5 % (ref 39.0–52.0)
HCT: 45.2 % (ref 39.0–52.0)
HCT: 46.4 % (ref 39.0–52.0)
HCT: 46.4 % (ref 39.0–52.0)
HCT: 49.3 % (ref 39.0–52.0)
Hemoglobin: 12.1 g/dL — ABNORMAL LOW (ref 13.0–17.0)
Hemoglobin: 12.7 g/dL — ABNORMAL LOW (ref 13.0–17.0)
Hemoglobin: 12.9 g/dL — ABNORMAL LOW (ref 13.0–17.0)
Hemoglobin: 12.9 g/dL — ABNORMAL LOW (ref 13.0–17.0)
Hemoglobin: 13.3 g/dL (ref 13.0–17.0)
Hemoglobin: 13.4 g/dL (ref 13.0–17.0)
Hemoglobin: 13.6 g/dL (ref 13.0–17.0)
Hemoglobin: 13.8 g/dL (ref 13.0–17.0)
Hemoglobin: 14.2 g/dL (ref 13.0–17.0)
Hemoglobin: 15.3 g/dL (ref 13.0–17.0)
Hemoglobin: 15.4 g/dL (ref 13.0–17.0)
Hemoglobin: 15.7 g/dL (ref 13.0–17.0)
Hemoglobin: 15.9 g/dL (ref 13.0–17.0)
MCHC: 32.1 g/dL (ref 30.0–36.0)
MCHC: 33.1 g/dL (ref 30.0–36.0)
MCHC: 33.2 g/dL (ref 30.0–36.0)
MCHC: 33.2 g/dL (ref 30.0–36.0)
MCHC: 33.3 g/dL (ref 30.0–36.0)
MCHC: 33.6 g/dL (ref 30.0–36.0)
MCHC: 33.8 g/dL (ref 30.0–36.0)
MCHC: 33.8 g/dL (ref 30.0–36.0)
MCHC: 33.9 g/dL (ref 30.0–36.0)
MCHC: 33.9 g/dL (ref 30.0–36.0)
MCHC: 34 g/dL (ref 30.0–36.0)
MCHC: 34.2 g/dL (ref 30.0–36.0)
MCHC: 34.3 g/dL (ref 30.0–36.0)
MCV: 94.3 fL (ref 78.0–100.0)
MCV: 94.6 fL (ref 78.0–100.0)
MCV: 94.9 fL (ref 78.0–100.0)
MCV: 95 fL (ref 78.0–100.0)
MCV: 95.1 fL (ref 78.0–100.0)
MCV: 95.2 fL (ref 78.0–100.0)
MCV: 95.3 fL (ref 78.0–100.0)
MCV: 95.4 fL (ref 78.0–100.0)
MCV: 95.5 fL (ref 78.0–100.0)
MCV: 95.5 fL (ref 78.0–100.0)
MCV: 95.8 fL (ref 78.0–100.0)
MCV: 95.9 fL (ref 78.0–100.0)
MCV: 96.2 fL (ref 78.0–100.0)
Platelets: 184 10*3/uL (ref 150–400)
Platelets: 211 10*3/uL (ref 150–400)
Platelets: 217 10*3/uL (ref 150–400)
Platelets: 220 10*3/uL (ref 150–400)
Platelets: 223 10*3/uL (ref 150–400)
Platelets: 226 10*3/uL (ref 150–400)
Platelets: 240 10*3/uL (ref 150–400)
Platelets: 250 10*3/uL (ref 150–400)
Platelets: 261 10*3/uL (ref 150–400)
Platelets: 266 10*3/uL (ref 150–400)
Platelets: 276 10*3/uL (ref 150–400)
Platelets: 294 10*3/uL (ref 150–400)
Platelets: 302 10*3/uL (ref 150–400)
RBC: 3.83 MIL/uL — ABNORMAL LOW (ref 4.22–5.81)
RBC: 3.9 MIL/uL — ABNORMAL LOW (ref 4.22–5.81)
RBC: 3.94 MIL/uL — ABNORMAL LOW (ref 4.22–5.81)
RBC: 3.98 MIL/uL — ABNORMAL LOW (ref 4.22–5.81)
RBC: 4.08 MIL/uL — ABNORMAL LOW (ref 4.22–5.81)
RBC: 4.16 MIL/uL — ABNORMAL LOW (ref 4.22–5.81)
RBC: 4.27 MIL/uL (ref 4.22–5.81)
RBC: 4.3 MIL/uL (ref 4.22–5.81)
RBC: 4.5 MIL/uL (ref 4.22–5.81)
RBC: 4.76 MIL/uL (ref 4.22–5.81)
RBC: 4.87 MIL/uL (ref 4.22–5.81)
RBC: 4.92 MIL/uL (ref 4.22–5.81)
RBC: 5.19 MIL/uL (ref 4.22–5.81)
RDW: 13.6 % (ref 11.5–15.5)
RDW: 13.6 % (ref 11.5–15.5)
RDW: 13.6 % (ref 11.5–15.5)
RDW: 13.6 % (ref 11.5–15.5)
RDW: 13.7 % (ref 11.5–15.5)
RDW: 13.7 % (ref 11.5–15.5)
RDW: 13.7 % (ref 11.5–15.5)
RDW: 13.7 % (ref 11.5–15.5)
RDW: 13.8 % (ref 11.5–15.5)
RDW: 13.8 % (ref 11.5–15.5)
RDW: 13.8 % (ref 11.5–15.5)
RDW: 14 % (ref 11.5–15.5)
RDW: 14.1 % (ref 11.5–15.5)
WBC: 10.1 10*3/uL (ref 4.0–10.5)
WBC: 5.9 10*3/uL (ref 4.0–10.5)
WBC: 6.4 10*3/uL (ref 4.0–10.5)
WBC: 6.4 10*3/uL (ref 4.0–10.5)
WBC: 7.4 10*3/uL (ref 4.0–10.5)
WBC: 7.4 10*3/uL (ref 4.0–10.5)
WBC: 7.5 10*3/uL (ref 4.0–10.5)
WBC: 8 10*3/uL (ref 4.0–10.5)
WBC: 8.1 10*3/uL (ref 4.0–10.5)
WBC: 8.6 10*3/uL (ref 4.0–10.5)
WBC: 9.3 10*3/uL (ref 4.0–10.5)
WBC: 9.8 10*3/uL (ref 4.0–10.5)
WBC: 9.9 10*3/uL (ref 4.0–10.5)

## 2010-04-04 LAB — LIPID PANEL
Cholesterol: 107 mg/dL (ref 0–200)
Cholesterol: 133 mg/dL (ref 0–200)
HDL: 18 mg/dL — ABNORMAL LOW (ref >39)
HDL: 23 mg/dL — ABNORMAL LOW (ref >39)
LDL Cholesterol: 75 mg/dL (ref 0–99)
LDL Cholesterol: 89 mg/dL (ref 0–99)
Total CHOL/HDL Ratio: 5.8 RATIO
Total CHOL/HDL Ratio: 5.9 RATIO
Triglycerides: 106 mg/dL (ref <150)
Triglycerides: 69 mg/dL (ref <150)
VLDL: 14 mg/dL (ref 0–40)
VLDL: 21 mg/dL (ref 0–40)

## 2010-04-04 LAB — URINALYSIS, ROUTINE W REFLEX MICROSCOPIC
Bilirubin Urine: NEGATIVE
Glucose, UA: NEGATIVE mg/dL
Hgb urine dipstick: NEGATIVE
Ketones, ur: NEGATIVE mg/dL
Nitrite: NEGATIVE
Protein, ur: NEGATIVE mg/dL
Specific Gravity, Urine: 1.003 — ABNORMAL LOW (ref 1.005–1.030)
Urobilinogen, UA: 1 mg/dL (ref 0.0–1.0)
pH: 8 (ref 5.0–8.0)

## 2010-04-04 LAB — POCT I-STAT, CHEM 8
BUN: 3 mg/dL — ABNORMAL LOW (ref 6–23)
BUN: 5 mg/dL — ABNORMAL LOW (ref 6–23)
BUN: <3 mg/dL — ABNORMAL LOW (ref 6–23)
Calcium, Ion: 1.09 mmol/L — ABNORMAL LOW (ref 1.12–1.32)
Calcium, Ion: 1.12 mmol/L (ref 1.12–1.32)
Calcium, Ion: 1.16 mmol/L (ref 1.12–1.32)
Chloride: 109 mEq/L (ref 96–112)
Chloride: 98 mEq/L (ref 96–112)
Chloride: 99 mEq/L (ref 96–112)
Creatinine, Ser: 0.9 mg/dL (ref 0.4–1.5)
Creatinine, Ser: 0.9 mg/dL (ref 0.4–1.5)
Creatinine, Ser: 0.9 mg/dL (ref 0.4–1.5)
Glucose, Bld: 102 mg/dL — ABNORMAL HIGH (ref 70–99)
Glucose, Bld: 117 mg/dL — ABNORMAL HIGH (ref 70–99)
Glucose, Bld: 159 mg/dL — ABNORMAL HIGH (ref 70–99)
HCT: 45 % (ref 39.0–52.0)
HCT: 46 % (ref 39.0–52.0)
HCT: 46 % (ref 39.0–52.0)
Hemoglobin: 15.3 g/dL (ref 13.0–17.0)
Hemoglobin: 15.6 g/dL (ref 13.0–17.0)
Hemoglobin: 15.6 g/dL (ref 13.0–17.0)
Potassium: 3.5 mEq/L (ref 3.5–5.1)
Potassium: 3.6 mEq/L (ref 3.5–5.1)
Potassium: 4.1 mEq/L (ref 3.5–5.1)
Sodium: 138 mEq/L (ref 135–145)
Sodium: 139 mEq/L (ref 135–145)
Sodium: 141 mEq/L (ref 135–145)
TCO2: 27 mmol/L (ref 0–100)
TCO2: 30 mmol/L (ref 0–100)
TCO2: 35 mmol/L (ref 0–100)

## 2010-04-04 LAB — POCT CARDIAC MARKERS
CKMB, poc: 1.2 ng/mL (ref 1.0–8.0)
CKMB, poc: 1.4 ng/mL (ref 1.0–8.0)
CKMB, poc: 1.6 ng/mL (ref 1.0–8.0)
CKMB, poc: <1 ng/mL — ABNORMAL LOW (ref 1.0–8.0)
CKMB, poc: <1 ng/mL — ABNORMAL LOW (ref 1.0–8.0)
Myoglobin, poc: 45.2 ng/mL (ref 12–200)
Myoglobin, poc: 47.5 ng/mL (ref 12–200)
Myoglobin, poc: 58.1 ng/mL (ref 12–200)
Myoglobin, poc: 78.5 ng/mL (ref 12–200)
Myoglobin, poc: 97.6 ng/mL (ref 12–200)
Troponin i, poc: <0.05 ng/mL (ref 0.00–0.09)
Troponin i, poc: <0.05 ng/mL (ref 0.00–0.09)
Troponin i, poc: <0.05 ng/mL (ref 0.00–0.09)
Troponin i, poc: <0.05 ng/mL (ref 0.00–0.09)
Troponin i, poc: <0.05 ng/mL (ref 0.00–0.09)

## 2010-04-04 LAB — URINE DRUGS OF ABUSE SCREEN W ALC, ROUTINE (REF LAB)
Amphetamine Screen, Ur: NEGATIVE
Barbiturate Quant, Ur: NEGATIVE
Benzodiazepines.: POSITIVE — AB
Cocaine Metabolites: NEGATIVE
Creatinine,U: 11.5 mg/dL
Ethyl Alcohol: <10 mg/dL (ref <10)
Marijuana Metabolite: NEGATIVE
Methadone: NEGATIVE
Opiate Screen, Urine: NEGATIVE
Phencyclidine (PCP): NEGATIVE
Propoxyphene: NEGATIVE

## 2010-04-04 LAB — COMPREHENSIVE METABOLIC PANEL
ALT: 11 U/L (ref 0–53)
ALT: 11 U/L (ref 0–53)
ALT: 12 U/L (ref 0–53)
ALT: 13 U/L (ref 0–53)
AST: 14 U/L (ref 0–37)
AST: 15 U/L (ref 0–37)
AST: 15 U/L (ref 0–37)
AST: 22 U/L (ref 0–37)
Albumin: 2.8 g/dL — ABNORMAL LOW (ref 3.5–5.2)
Albumin: 3.1 g/dL — ABNORMAL LOW (ref 3.5–5.2)
Albumin: 3.2 g/dL — ABNORMAL LOW (ref 3.5–5.2)
Albumin: 3.9 g/dL (ref 3.5–5.2)
Alkaline Phosphatase: 67 U/L (ref 39–117)
Alkaline Phosphatase: 70 U/L (ref 39–117)
Alkaline Phosphatase: 76 U/L (ref 39–117)
Alkaline Phosphatase: 93 U/L (ref 39–117)
BUN: 3 mg/dL — ABNORMAL LOW (ref 6–23)
BUN: 4 mg/dL — ABNORMAL LOW (ref 6–23)
BUN: 4 mg/dL — ABNORMAL LOW (ref 6–23)
BUN: 5 mg/dL — ABNORMAL LOW (ref 6–23)
CO2: 24 mEq/L (ref 19–32)
CO2: 26 mEq/L (ref 19–32)
CO2: 29 mEq/L (ref 19–32)
CO2: 30 mEq/L (ref 19–32)
Calcium: 8.3 mg/dL — ABNORMAL LOW (ref 8.4–10.5)
Calcium: 8.4 mg/dL (ref 8.4–10.5)
Calcium: 8.9 mg/dL (ref 8.4–10.5)
Calcium: 9.4 mg/dL (ref 8.4–10.5)
Chloride: 106 mEq/L (ref 96–112)
Chloride: 108 mEq/L (ref 96–112)
Chloride: 110 mEq/L (ref 96–112)
Chloride: 97 mEq/L (ref 96–112)
Creatinine, Ser: 0.59 mg/dL (ref 0.4–1.5)
Creatinine, Ser: 0.69 mg/dL (ref 0.4–1.5)
Creatinine, Ser: 0.72 mg/dL (ref 0.4–1.5)
Creatinine, Ser: 0.9 mg/dL (ref 0.4–1.5)
GFR calc Af Amer: >60 mL/min (ref >60)
GFR calc Af Amer: >60 mL/min (ref >60)
GFR calc Af Amer: >60 mL/min (ref >60)
GFR calc Af Amer: >60 mL/min (ref >60)
GFR calc non Af Amer: >60 mL/min (ref >60)
GFR calc non Af Amer: >60 mL/min (ref >60)
GFR calc non Af Amer: >60 mL/min (ref >60)
GFR calc non Af Amer: >60 mL/min (ref >60)
Glucose, Bld: 111 mg/dL — ABNORMAL HIGH (ref 70–99)
Glucose, Bld: 117 mg/dL — ABNORMAL HIGH (ref 70–99)
Glucose, Bld: 161 mg/dL — ABNORMAL HIGH (ref 70–99)
Glucose, Bld: 86 mg/dL (ref 70–99)
Potassium: 3.1 mEq/L — ABNORMAL LOW (ref 3.5–5.1)
Potassium: 3.2 mEq/L — ABNORMAL LOW (ref 3.5–5.1)
Potassium: 3.6 mEq/L (ref 3.5–5.1)
Potassium: 3.9 mEq/L (ref 3.5–5.1)
Sodium: 139 mEq/L (ref 135–145)
Sodium: 139 mEq/L (ref 135–145)
Sodium: 141 mEq/L (ref 135–145)
Sodium: 142 mEq/L (ref 135–145)
Total Bilirubin: 0.4 mg/dL (ref 0.3–1.2)
Total Bilirubin: 0.4 mg/dL (ref 0.3–1.2)
Total Bilirubin: 0.6 mg/dL (ref 0.3–1.2)
Total Bilirubin: 0.6 mg/dL (ref 0.3–1.2)
Total Protein: 5.5 g/dL — ABNORMAL LOW (ref 6.0–8.3)
Total Protein: 6.1 g/dL (ref 6.0–8.3)
Total Protein: 6.2 g/dL (ref 6.0–8.3)
Total Protein: 7.7 g/dL (ref 6.0–8.3)

## 2010-04-04 LAB — DIFFERENTIAL
Basophils Absolute: 0 10*3/uL (ref 0.0–0.1)
Basophils Absolute: 0 10*3/uL (ref 0.0–0.1)
Basophils Absolute: 0.1 10*3/uL (ref 0.0–0.1)
Basophils Absolute: 0.1 10*3/uL (ref 0.0–0.1)
Basophils Absolute: 0.1 10*3/uL (ref 0.0–0.1)
Basophils Relative: 0 % (ref 0–1)
Basophils Relative: 0 % (ref 0–1)
Basophils Relative: 1 % (ref 0–1)
Basophils Relative: 1 % (ref 0–1)
Basophils Relative: 1 % (ref 0–1)
Eosinophils Absolute: 0 10*3/uL (ref 0.0–0.7)
Eosinophils Absolute: 0.1 10*3/uL (ref 0.0–0.7)
Eosinophils Absolute: 0.1 10*3/uL (ref 0.0–0.7)
Eosinophils Absolute: 0.1 10*3/uL (ref 0.0–0.7)
Eosinophils Absolute: 0.2 10*3/uL (ref 0.0–0.7)
Eosinophils Relative: 1 % (ref 0–5)
Eosinophils Relative: 1 % (ref 0–5)
Eosinophils Relative: 1 % (ref 0–5)
Eosinophils Relative: 1 % (ref 0–5)
Eosinophils Relative: 3 % (ref 0–5)
Lymphocytes Relative: 15 % (ref 12–46)
Lymphocytes Relative: 15 % (ref 12–46)
Lymphocytes Relative: 21 % (ref 12–46)
Lymphocytes Relative: 24 % (ref 12–46)
Lymphocytes Relative: 25 % (ref 12–46)
Lymphs Abs: 1.2 10*3/uL (ref 0.7–4.0)
Lymphs Abs: 1.4 10*3/uL (ref 0.7–4.0)
Lymphs Abs: 1.8 10*3/uL (ref 0.7–4.0)
Lymphs Abs: 1.9 10*3/uL (ref 0.7–4.0)
Lymphs Abs: 2.3 10*3/uL (ref 0.7–4.0)
Monocytes Absolute: 0.5 10*3/uL (ref 0.1–1.0)
Monocytes Absolute: 0.5 10*3/uL (ref 0.1–1.0)
Monocytes Absolute: 0.6 10*3/uL (ref 0.1–1.0)
Monocytes Absolute: 0.6 10*3/uL (ref 0.1–1.0)
Monocytes Absolute: 0.7 10*3/uL (ref 0.1–1.0)
Monocytes Relative: 6 % (ref 3–12)
Monocytes Relative: 6 % (ref 3–12)
Monocytes Relative: 7 % (ref 3–12)
Monocytes Relative: 8 % (ref 3–12)
Monocytes Relative: 8 % (ref 3–12)
Neutro Abs: 4.7 10*3/uL (ref 1.7–7.7)
Neutro Abs: 6.1 10*3/uL (ref 1.7–7.7)
Neutro Abs: 6.2 10*3/uL (ref 1.7–7.7)
Neutro Abs: 6.8 10*3/uL (ref 1.7–7.7)
Neutro Abs: 7.5 10*3/uL (ref 1.7–7.7)
Neutrophils Relative %: 63 % (ref 43–77)
Neutrophils Relative %: 69 % (ref 43–77)
Neutrophils Relative %: 72 % (ref 43–77)
Neutrophils Relative %: 76 % (ref 43–77)
Neutrophils Relative %: 78 % — ABNORMAL HIGH (ref 43–77)

## 2010-04-04 LAB — LIPASE, BLOOD
Lipase: 17 U/L (ref 11–59)
Lipase: 30 U/L (ref 11–59)

## 2010-04-04 LAB — CK TOTAL AND CKMB (NOT AT ARMC)
CK, MB: 1.6 ng/mL (ref 0.3–4.0)
CK, MB: 2.3 ng/mL (ref 0.3–4.0)
Relative Index: INVALID (ref 0.0–2.5)
Relative Index: INVALID (ref 0.0–2.5)
Total CK: 56 U/L (ref 7–232)
Total CK: 88 U/L (ref 7–232)

## 2010-04-04 LAB — TROPONIN I
Troponin I: 0.01 ng/mL (ref 0.00–0.06)
Troponin I: 0.01 ng/mL (ref 0.00–0.06)
Troponin I: 0.02 ng/mL (ref 0.00–0.06)

## 2010-04-04 LAB — BRAIN NATRIURETIC PEPTIDE: Pro B Natriuretic peptide (BNP): <30 pg/mL (ref 0.0–100.0)

## 2010-04-04 LAB — BENZODIAZEPINE, QUANTITATIVE, URINE
Alprazolam (GC/LC/MS), ur confirm: NEGATIVE NG/ML
Nordiazepam GC/MS Conf: NEGATIVE NG/ML
Oxazepam GC/MS Conf: NEGATIVE NG/ML
Temazepam GC/MS Conf: NEGATIVE NG/ML

## 2010-04-04 LAB — MRSA PCR SCREENING: MRSA by PCR: NEGATIVE

## 2010-04-04 LAB — GLUCOSE, CAPILLARY: Glucose-Capillary: 153 mg/dL — ABNORMAL HIGH (ref 70–99)

## 2010-04-04 LAB — PROTIME-INR
INR: 1.05 (ref 0.00–1.49)
Prothrombin Time: 13.6 seconds (ref 11.6–15.2)

## 2010-04-04 LAB — TSH: TSH: 0.643 u[IU]/mL (ref 0.350–4.500)

## 2010-05-18 ENCOUNTER — Other Ambulatory Visit: Payer: Self-pay

## 2010-05-18 MED ORDER — ISOSORBIDE MONONITRATE ER 30 MG PO TB24: ORAL_TABLET | ORAL | Status: DC

## 2010-06-07 ENCOUNTER — Other Ambulatory Visit: Payer: Self-pay | Admitting: Emergency Medicine

## 2010-06-07 MED ORDER — FUROSEMIDE 20 MG PO TABS: 20.0000 mg | ORAL_TABLET | Freq: Every day | ORAL | Status: DC | PRN

## 2010-07-05 ENCOUNTER — Ambulatory Visit: Payer: Self-pay | Admitting: Unknown Physician Specialty

## 2010-07-20 ENCOUNTER — Encounter: Payer: Self-pay | Admitting: Cardiovascular Disease

## 2010-09-21 ENCOUNTER — Ambulatory Visit: Payer: Self-pay | Admitting: Unknown Physician Specialty

## 2010-09-21 DIAGNOSIS — I10 Essential (primary) hypertension: Secondary | ICD-10-CM

## 2010-10-06 ENCOUNTER — Ambulatory Visit (INDEPENDENT_AMBULATORY_CARE_PROVIDER_SITE_OTHER): Payer: Medicaid Other | Admitting: Cardiovascular Disease

## 2010-10-06 ENCOUNTER — Encounter: Payer: Self-pay | Admitting: Cardiovascular Disease

## 2010-10-06 DIAGNOSIS — R079 Chest pain, unspecified: Secondary | ICD-10-CM

## 2010-10-06 DIAGNOSIS — Z8679 Personal history of other diseases of the circulatory system: Secondary | ICD-10-CM

## 2010-10-06 DIAGNOSIS — R0989 Other specified symptoms and signs involving the circulatory and respiratory systems: Secondary | ICD-10-CM

## 2010-10-06 DIAGNOSIS — Z7189 Other specified counseling: Secondary | ICD-10-CM | POA: Insufficient documentation

## 2010-10-06 DIAGNOSIS — I2510 Atherosclerotic heart disease of native coronary artery without angina pectoris: Secondary | ICD-10-CM

## 2010-10-06 DIAGNOSIS — E785 Hyperlipidemia, unspecified: Secondary | ICD-10-CM

## 2010-10-06 DIAGNOSIS — Z01810 Encounter for preprocedural cardiovascular examination: Secondary | ICD-10-CM

## 2010-10-06 DIAGNOSIS — F17200 Nicotine dependence, unspecified, uncomplicated: Secondary | ICD-10-CM

## 2010-10-06 NOTE — Assessment & Plan Note (Signed)
Notes from Dr. Lacie Scotts indicates ejection fraction of 50%. Full echocardiogram report is not available. He denies any significant symptoms of chest pain. Lower extremity edema has improved on increased diuretic. I am concerned that some of his edema could be secondary to venous insufficiency and we will check a basic metabolic panel today to make sure that we are not over diuresing him.  He would be an acceptable risk for up coming back surgery. No further testing is needed

## 2010-10-06 NOTE — Assessment & Plan Note (Signed)
Currently with no symptoms of angina. No further workup at this time. Continue current medication regimen. 

## 2010-10-06 NOTE — Assessment & Plan Note (Signed)
Blood pressure is well controlled on today's visit. No changes made to the medications. 

## 2010-10-06 NOTE — Assessment & Plan Note (Signed)
He has started to smoke again. We have counseled him on smoking cessation.

## 2010-10-06 NOTE — Assessment & Plan Note (Signed)
We do not have his most recent lipid panel. Cholesterol from last year was well controlled.

## 2010-10-06 NOTE — Progress Notes (Signed)
Patient ID: Gregory Hall, male    DOB: 10/18/1955, 55 y.o.   MRN: [id number]  HPI Comments: 55 yo male with CAD, PCI of the LAD in 2010, repeat cath in 03/2009, hyperlipidemia, HTN, psychiatric hx, long smoking hx, recent treatment for MRSA,  seen in consultation [facility] for chest pain 10/30/1009, Who presents for routine followup and for preoperative clearance for back surgery.  He reports that he was seen by Dr. Lacie Scotts who felt he had an abnormal EKG. Echocardiogram was ordered for further cardiac evaluation. This report is not available to [place] at this time for review. He was having symptoms of worsening lower extremity edema. He was changed from Lasix to torsemide. In the last week, he reports his edema has improved significantly though he continues to have mild edema.  He has had worsening back pain. He has had workup including MRI and is scheduled for back surgery with Dr. Gerrit Heck.   He has a h/o of significant esophageal pathology and spasm with erosive gastritis/GERD per his report.   He denies any further chest pain.    EKG: NSR with rate of 83bpm, T wave ABN in anterolateral and inferior leads, IRBBB      Outpatient Encounter Prescriptions as of 10/06/2010  Medication Sig Dispense Refill  . albuterol (VENTOLIN HFA) 108 (90 BASE) MCG/ACT inhaler Inhale 2 puffs into the lungs every 6 (six) hours as needed.        Marland Kitchen amitriptyline (ELAVIL) 75 MG tablet Take 75 mg by mouth at bedtime.        Marland Kitchen aspirin 81 MG tablet Take 81 mg by mouth daily.        . cetirizine (ZYRTEC) 10 MG tablet Take 10 mg by mouth daily.        . chlorzoxazone (PARAFON) 500 MG tablet Take 500 mg by mouth 3 (three) times daily as needed.        . citalopram (CELEXA) 20 MG tablet Take 20 mg by mouth daily.        . cyclobenzaprine (FLEXERIL) 5 MG tablet Take 5 mg by mouth 2 (two) times daily.        . diazepam (VALIUM) 10 MG tablet Take 10 mg by mouth 3 (three) times daily.        . diphenhydrAMINE (SOMINEX) 25  MG tablet Take 25 mg by mouth every 8 (eight) hours as needed.        . doxycycline (VIBRAMYCIN) 100 MG capsule Take 100 mg by mouth 2 (two) times daily.        Marland Kitchen esomeprazole (NEXIUM) 40 MG capsule Take 40 mg by mouth 2 (two) times daily.        Marland Kitchen gabapentin (NEURONTIN) 300 MG capsule Take 300 mg by mouth 3 (three) times daily.        . hydrocortisone 2.5 % cream Apply 1 application topically 2 (two) times daily.        . hydrOXYzine (ATARAX) 25 MG tablet Take 25 mg by mouth.        . isosorbide mononitrate (IMDUR) 30 MG 24 hr tablet Take (1/2) tablet by mouth once daily.  15 tablet  3  . lisinopril (PRINIVIL,ZESTRIL) 5 MG tablet Take 5 mg by mouth every morning.        . metoprolol tartrate (LOPRESSOR) 25 MG tablet Take 25 mg by mouth 2 (two) times daily.        . nitroGLYCERIN (NITROSTAT) 0.4 MG SL tablet Place 0.4 mg under the tongue  as needed.        Marland Kitchen oxyCODONE (OXYCONTIN) 10 MG 12 hr tablet Take 10 mg by mouth 3 (three) times daily.        . simvastatin (ZOCOR) 20 MG tablet Take 20 mg by mouth at bedtime.        . torsemide (DEMADEX) 20 MG tablet Take 20 mg by mouth 2 (two) times daily.        . traZODone (DESYREL) 150 MG tablet Take 150 mg by mouth at bedtime.        . triamcinolone (KENALOG) 0.1 % cream Apply 1 application topically 2 (two) times daily.           Review of Systems  Constitutional: Negative.   HENT: Negative.   Eyes: Negative.   Respiratory: Negative.   Cardiovascular: Positive for leg swelling.  Gastrointestinal: Negative.   Musculoskeletal: Negative.   Skin: Negative.   Neurological: Negative.   Hematological: Negative.   Psychiatric/Behavioral: Negative.   All other systems reviewed and are negative.    BP 115/74  Pulse 85  Ht 6\' 2"  (1.88 m)  Wt 227 lb 8 oz (103.193 kg)  BMI 29.21 kg/m2  Physical Exam  Nursing note and vitals reviewed. Constitutional: He is oriented to person, place, and time. He appears well-developed and well-nourished.  HENT:    Head: Normocephalic.  Nose: Nose normal.  Mouth/Throat: Oropharynx is clear and moist.  Eyes: Conjunctivae are normal. Pupils are equal, round, and reactive to light.  Neck: Normal range of motion. Neck supple. No JVD present.  Cardiovascular: Normal rate, regular rhythm, S1 normal, S2 normal, normal heart sounds and intact distal pulses.  Exam reveals no gallop and no friction rub.   No murmur heard.      Trace to 1+ nonpitting edema bilaterally to the mid shins.  Pulmonary/Chest: Effort normal and breath sounds normal. No respiratory distress. He has no wheezes. He has no rales. He exhibits no tenderness.  Abdominal: Soft. Bowel sounds are normal. He exhibits no distension. There is no tenderness.  Musculoskeletal: Normal range of motion. He exhibits no edema and no tenderness.  Lymphadenopathy:    He has no cervical adenopathy.  Neurological: He is alert and oriented to person, place, and time. Coordination normal.  Skin: Skin is warm and dry. No rash noted. No erythema.  Psychiatric: He has a normal mood and affect. His behavior is normal. Judgment and thought content normal.           Assessment and Plan

## 2010-10-06 NOTE — Patient Instructions (Signed)
You are doing well. No medication changes were made. We will check a BMP today  Please call us if you have new issues that need to be addressed before your next appt.  We will call you for a follow up Appt. In 6 months

## 2010-10-07 LAB — BASIC METABOLIC PANEL
BUN: 11 mg/dL (ref 6–23)
CO2: 28 mEq/L (ref 19–32)
Calcium: 8.7 mg/dL (ref 8.4–10.5)
Chloride: 101 mEq/L (ref 96–112)
Creat: 1.25 mg/dL (ref 0.50–1.35)
Glucose, Bld: 161 mg/dL — ABNORMAL HIGH (ref 70–99)
Potassium: 4.6 mEq/L (ref 3.5–5.3)
Sodium: 139 mEq/L (ref 135–145)

## 2010-10-13 ENCOUNTER — Inpatient Hospital Stay: Payer: Self-pay | Admitting: Unknown Physician Specialty

## 2010-11-15 ENCOUNTER — Ambulatory Visit: Payer: Self-pay | Admitting: Emergency Medicine

## 2010-11-20 LAB — PATHOLOGY REPORT

## 2011-05-10 ENCOUNTER — Ambulatory Visit (INDEPENDENT_AMBULATORY_CARE_PROVIDER_SITE_OTHER): Payer: Medicaid Other | Admitting: Cardiovascular Disease

## 2011-05-10 ENCOUNTER — Encounter: Payer: Self-pay | Admitting: Cardiovascular Disease

## 2011-05-10 VITALS — BP 110/68 | HR 91 | Ht 74.0 in | Wt 237.5 lb

## 2011-05-10 DIAGNOSIS — R079 Chest pain, unspecified: Secondary | ICD-10-CM

## 2011-05-10 DIAGNOSIS — Z8679 Personal history of other diseases of the circulatory system: Secondary | ICD-10-CM

## 2011-05-10 DIAGNOSIS — I2510 Atherosclerotic heart disease of native coronary artery without angina pectoris: Secondary | ICD-10-CM

## 2011-05-10 DIAGNOSIS — E785 Hyperlipidemia, unspecified: Secondary | ICD-10-CM

## 2011-05-10 DIAGNOSIS — R0989 Other specified symptoms and signs involving the circulatory and respiratory systems: Secondary | ICD-10-CM

## 2011-05-10 DIAGNOSIS — R609 Edema, unspecified: Secondary | ICD-10-CM | POA: Insufficient documentation

## 2011-05-10 DIAGNOSIS — F17200 Nicotine dependence, unspecified, uncomplicated: Secondary | ICD-10-CM

## 2011-05-10 DIAGNOSIS — R0602 Shortness of breath: Secondary | ICD-10-CM

## 2011-05-10 NOTE — Progress Notes (Signed)
Patient ID: Gregory Hall, male    DOB: 11/15/1955, 56 y.o.   MRN: [id number]  HPI Comments: 56 yo male with CAD, PCI of the LAD in 2010, repeat cath in 03/2009, hyperlipidemia, HTN, psychiatric hx, long smoking hx, recent treatment for MRSA,  seen in consultation [facility] for chest pain 10/30/1009, Who presents for routine followup.  He reports that he has had 2 back surgeries since his last clinic visit. He continues to have back pain. His back surgeon has left [facility] ( Dr. Gerrit Heck). He would like pain medication. His biggest complaint is leg swelling. It was worse last week, better this week. He denies any significant abdominal swelling, chest pain, shortness of breath. He has had sores that are weeping on his legs. He spends much of his time with his legs down.    He has a h/o of significant esophageal pathology and spasm with erosive gastritis/GERD per his report.    EKG: NSR with rate of 91 bpm, T wave ABN in anterolateral and inferior leads, IRBBB      Outpatient Encounter Prescriptions as of 05/10/2011  Medication Sig Dispense Refill  . albuterol (VENTOLIN HFA) 108 (90 BASE) MCG/ACT inhaler Inhale 2 puffs into the lungs every 6 (six) hours as needed.        Marland Kitchen amitriptyline (ELAVIL) 75 MG tablet Take 75 mg by mouth at bedtime.        Marland Kitchen aspirin 81 MG tablet Take 81 mg by mouth daily.        . cetirizine (ZYRTEC) 10 MG tablet Take 10 mg by mouth daily.        . chlorzoxazone (PARAFON) 500 MG tablet Take 500 mg by mouth 3 (three) times daily as needed.        . citalopram (CELEXA) 20 MG tablet Take 40 mg by mouth daily.       . cyclobenzaprine (FLEXERIL) 5 MG tablet Take 5 mg by mouth 2 (two) times daily.        . diazepam (VALIUM) 10 MG tablet Take 10 mg by mouth 3 (three) times daily.        . diphenhydrAMINE (SOMINEX) 25 MG tablet Take 25 mg by mouth every 8 (eight) hours as needed.        Marland Kitchen esomeprazole (NEXIUM) 40 MG capsule Take 40 mg by mouth 2 (two) times daily.        .  furosemide (LASIX) 20 MG tablet Take 20 mg by mouth daily.      Marland Kitchen gabapentin (NEURONTIN) 300 MG capsule Take 300 mg by mouth 3 (three) times daily.        . hydrocortisone 2.5 % cream Apply 1 application topically 2 (two) times daily.        . hydrOXYzine (ATARAX) 25 MG tablet Take 25 mg by mouth.        . isosorbide mononitrate (IMDUR) 30 MG 24 hr tablet Take (1/2) tablet by mouth once daily.  15 tablet  3  . lubiprostone (AMITIZA) 24 MCG capsule Take 24 mcg by mouth 2 (two) times daily.      . metoprolol tartrate (LOPRESSOR) 25 MG tablet Take 25 mg by mouth 2 (two) times daily.        . mometasone (ELOCON) 0.1 % cream Apply topically daily.      . nitroGLYCERIN (NITROSTAT) 0.4 MG SL tablet Place 0.4 mg under the tongue as needed.        Marland Kitchen oxyCODONE (OXYCONTIN) 10 MG 12 hr tablet  Take 10 mg by mouth 3 (three) times daily.        . simvastatin (ZOCOR) 20 MG tablet Take 40 mg by mouth at bedtime.       . torsemide (DEMADEX) 20 MG tablet Take 20 mg by mouth 2 (two) times daily.        . traZODone (DESYREL) 150 MG tablet Take 150 mg by mouth at bedtime.        . triamcinolone (KENALOG) 0.1 % cream Apply 1 application topically 2 (two) times daily.          Review of Systems  Constitutional: Negative.   HENT: Negative.   Eyes: Negative.   Respiratory: Negative.   Cardiovascular: Positive for leg swelling.  Gastrointestinal: Negative.   Musculoskeletal: Negative.   Skin: Positive for color change.       Skin changes of the lower extremities with weeping sores  Neurological: Negative.   Hematological: Negative.   Psychiatric/Behavioral: Negative.   All other systems reviewed and are negative.    BP 110/68  Pulse 91  Ht 6\' 2"  (1.88 m)  Wt 237 lb 8 oz (107.729 kg)  BMI 30.49 kg/m2  Physical Exam  Nursing note and vitals reviewed. Constitutional: He is oriented to person, place, and time. He appears well-developed and well-nourished.  HENT:  Head: Normocephalic.  Nose: Nose normal.   Mouth/Throat: Oropharynx is clear and moist.  Eyes: Conjunctivae are normal. Pupils are equal, round, and reactive to light.  Neck: Normal range of motion. Neck supple. No JVD present.  Cardiovascular: Normal rate, regular rhythm, S1 normal, S2 normal, normal heart sounds and intact distal pulses.  Exam reveals no gallop and no friction rub.   No murmur heard.      Trace to 1+ nonpitting edema bilaterally to the mid shins.  Pulmonary/Chest: Effort normal and breath sounds normal. No respiratory distress. He has no wheezes. He has no rales. He exhibits no tenderness.  Abdominal: Soft. Bowel sounds are normal. He exhibits no distension. There is no tenderness.  Musculoskeletal: Normal range of motion. He exhibits no edema and no tenderness.  Lymphadenopathy:    He has no cervical adenopathy.  Neurological: He is alert and oriented to person, place, and time. Coordination normal.  Skin: Skin is warm and dry. No rash noted. No erythema.       Skin changes of the lower extremities noted to the mid shins or higher. Numerous sores noted.  Psychiatric: He has a normal mood and affect. His behavior is normal. Judgment and thought content normal.           Assessment and Plan

## 2011-05-10 NOTE — Assessment & Plan Note (Signed)
Currently with no symptoms of angina. No further workup at this time. Continue current medication regimen. 

## 2011-05-10 NOTE — Assessment & Plan Note (Signed)
We have encouraged him to continue to work on weaning his cigarettes and smoking cessation. He will continue to work on this and does not want any assistance with chantix.  

## 2011-05-10 NOTE — Assessment & Plan Note (Signed)
No changes to the medications were made. We suggested he stay on his statin. Cholesterol will be checked by primary care physician.

## 2011-05-10 NOTE — Assessment & Plan Note (Signed)
Carotid ultrasound in the past, 2 years ago, showing mild disease bilaterally.

## 2011-05-10 NOTE — Assessment & Plan Note (Signed)
Edema is likely secondary to chronic venous insufficiency. No signs of cellulitis. We have recommended leg elevation and compression hose. We have given him the phone number of vein and vascular.

## 2011-05-10 NOTE — Patient Instructions (Signed)
You are doing well. No medication changes were made.  Please call us if you have new issues that need to be addressed before your next appt.  Your physician wants you to follow-up in: 6 months.  You will receive a reminder letter in the mail two months in advance. If you don't receive a letter, please call our office to schedule the follow-up appointment.  The phone number for Dr. Wyn Quaker and Dr. Lorretta Harp is [phone]

## 2011-05-10 NOTE — Assessment & Plan Note (Signed)
Blood pressure is well controlled on today's visit. No changes made to the medications. 

## 2011-06-16 IMAGING — CR DG CHEST 1V PORT
2 series · 2 of 2 positions shown · non-contrast
Comparison: CT chest and chest x-ray [DATE]

CLINICAL DATA: Chest pain and shortness of breath.

PORTABLE CHEST - 1 VIEW

[view not recorded (1 of 2)]
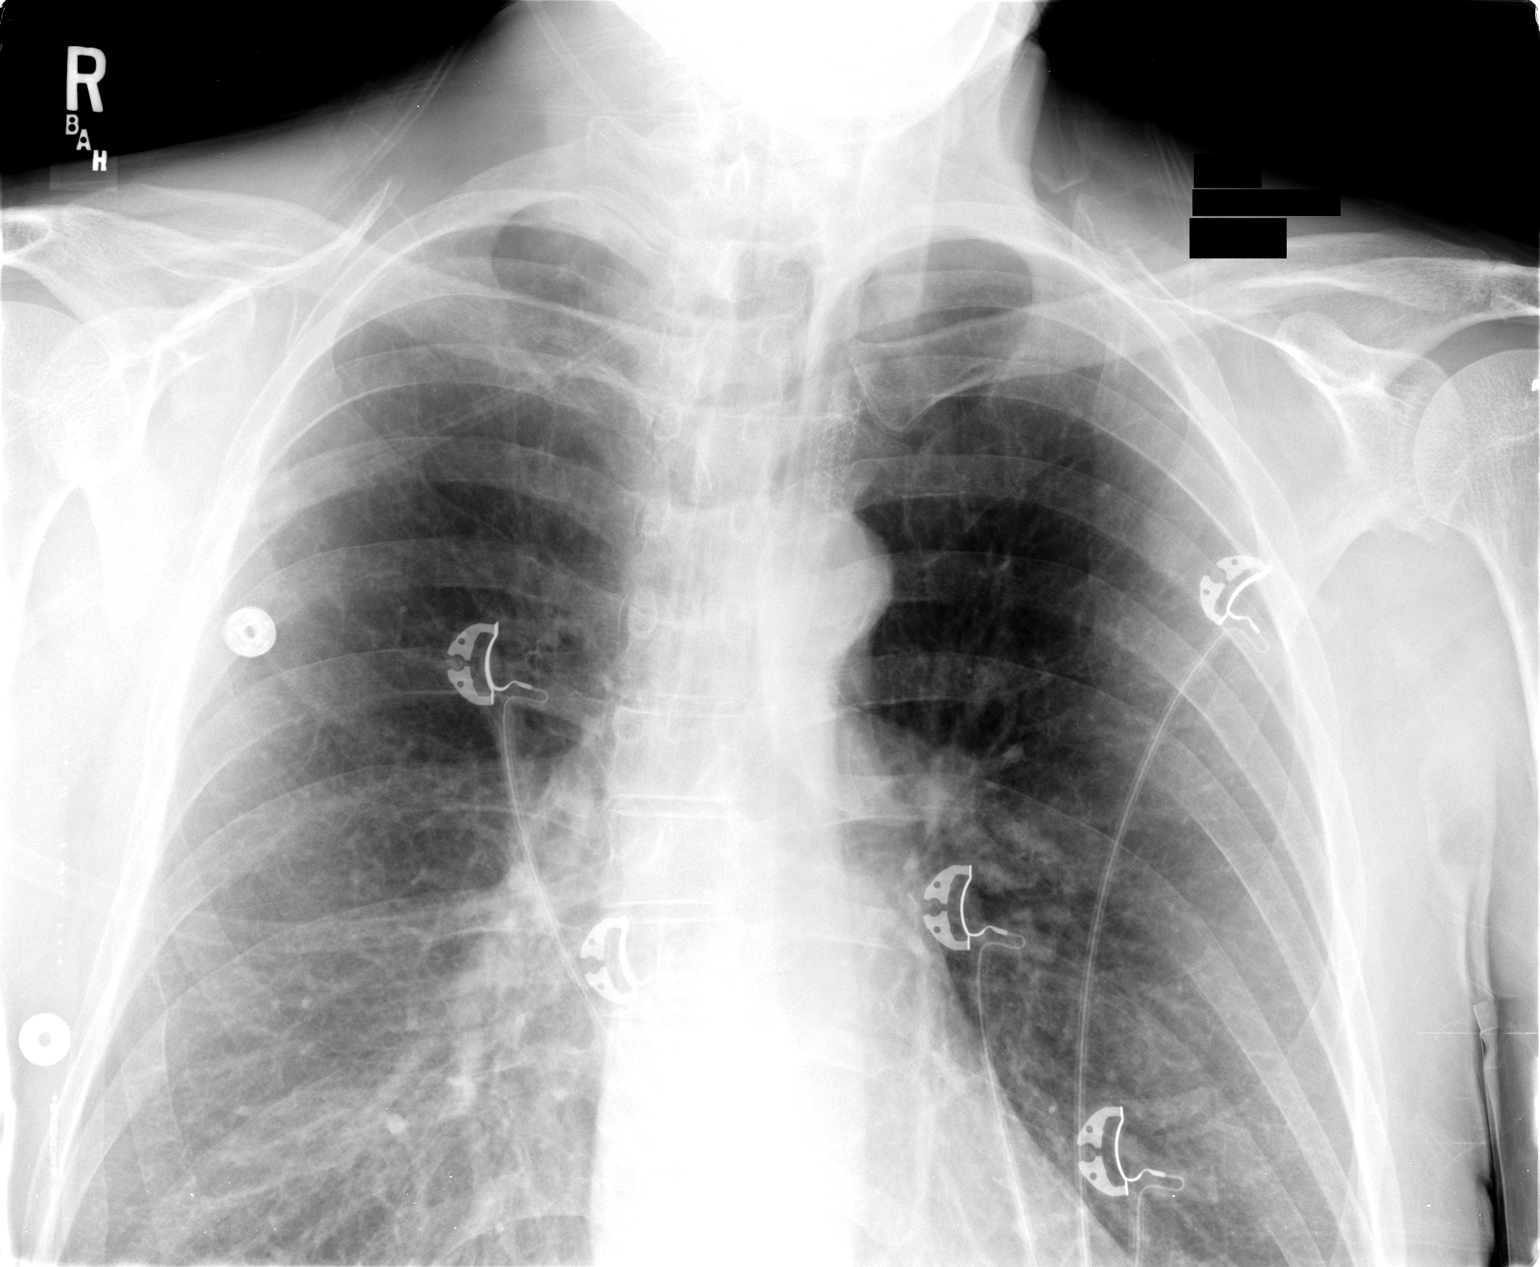

[view not recorded (2 of 2)]
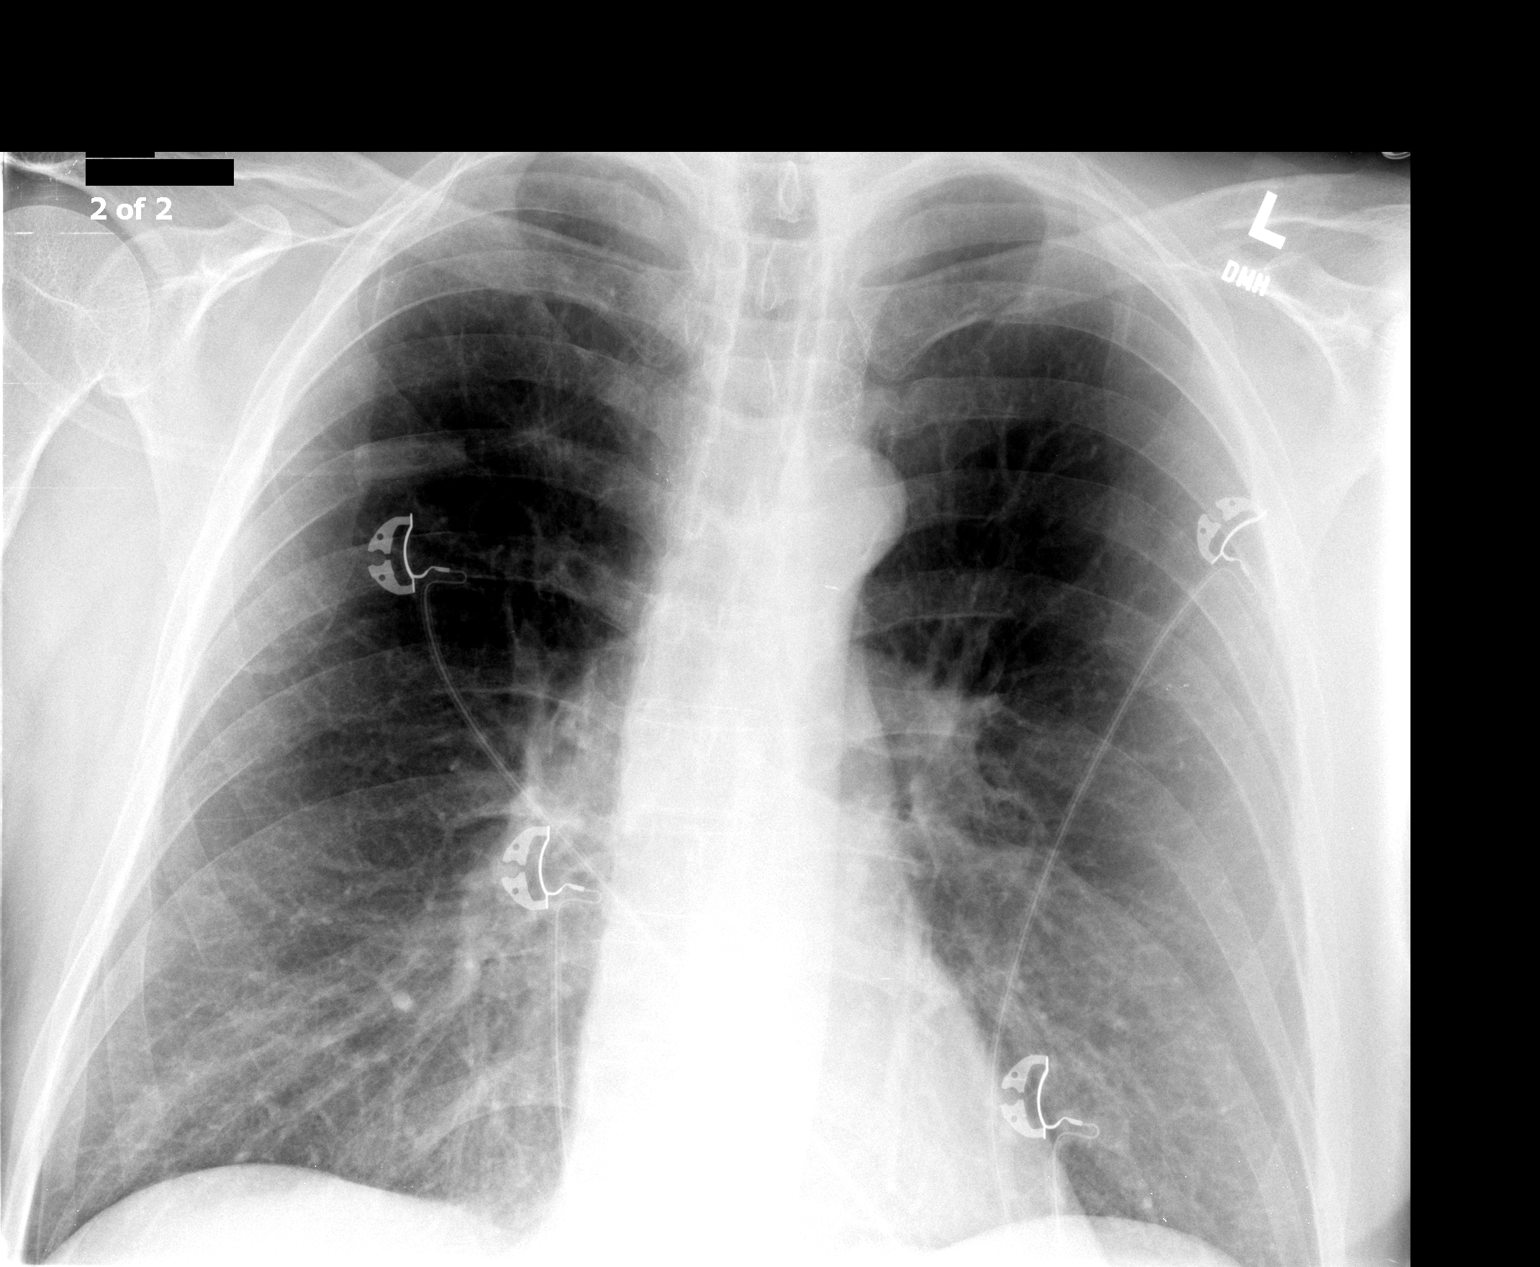

[2 of 2 positions shown; findings below may reference images not displayed]

FINDINGS: Trachea is midline.  Heart size normal.  Emphysema.
Lungs are clear.  No pleural fluid.
IMPRESSION: Emphysema.  No acute findings.

## 2011-06-22 ENCOUNTER — Ambulatory Visit: Payer: Self-pay | Admitting: Orthopedic Surgery

## 2011-06-26 IMAGING — CR DG CHEST 1V PORT
1 series · 1 of 1 positions shown · non-contrast
Comparison: 03/28/2009

CLINICAL DATA: Chest pain

PORTABLE CHEST - 1 VIEW

[AP]
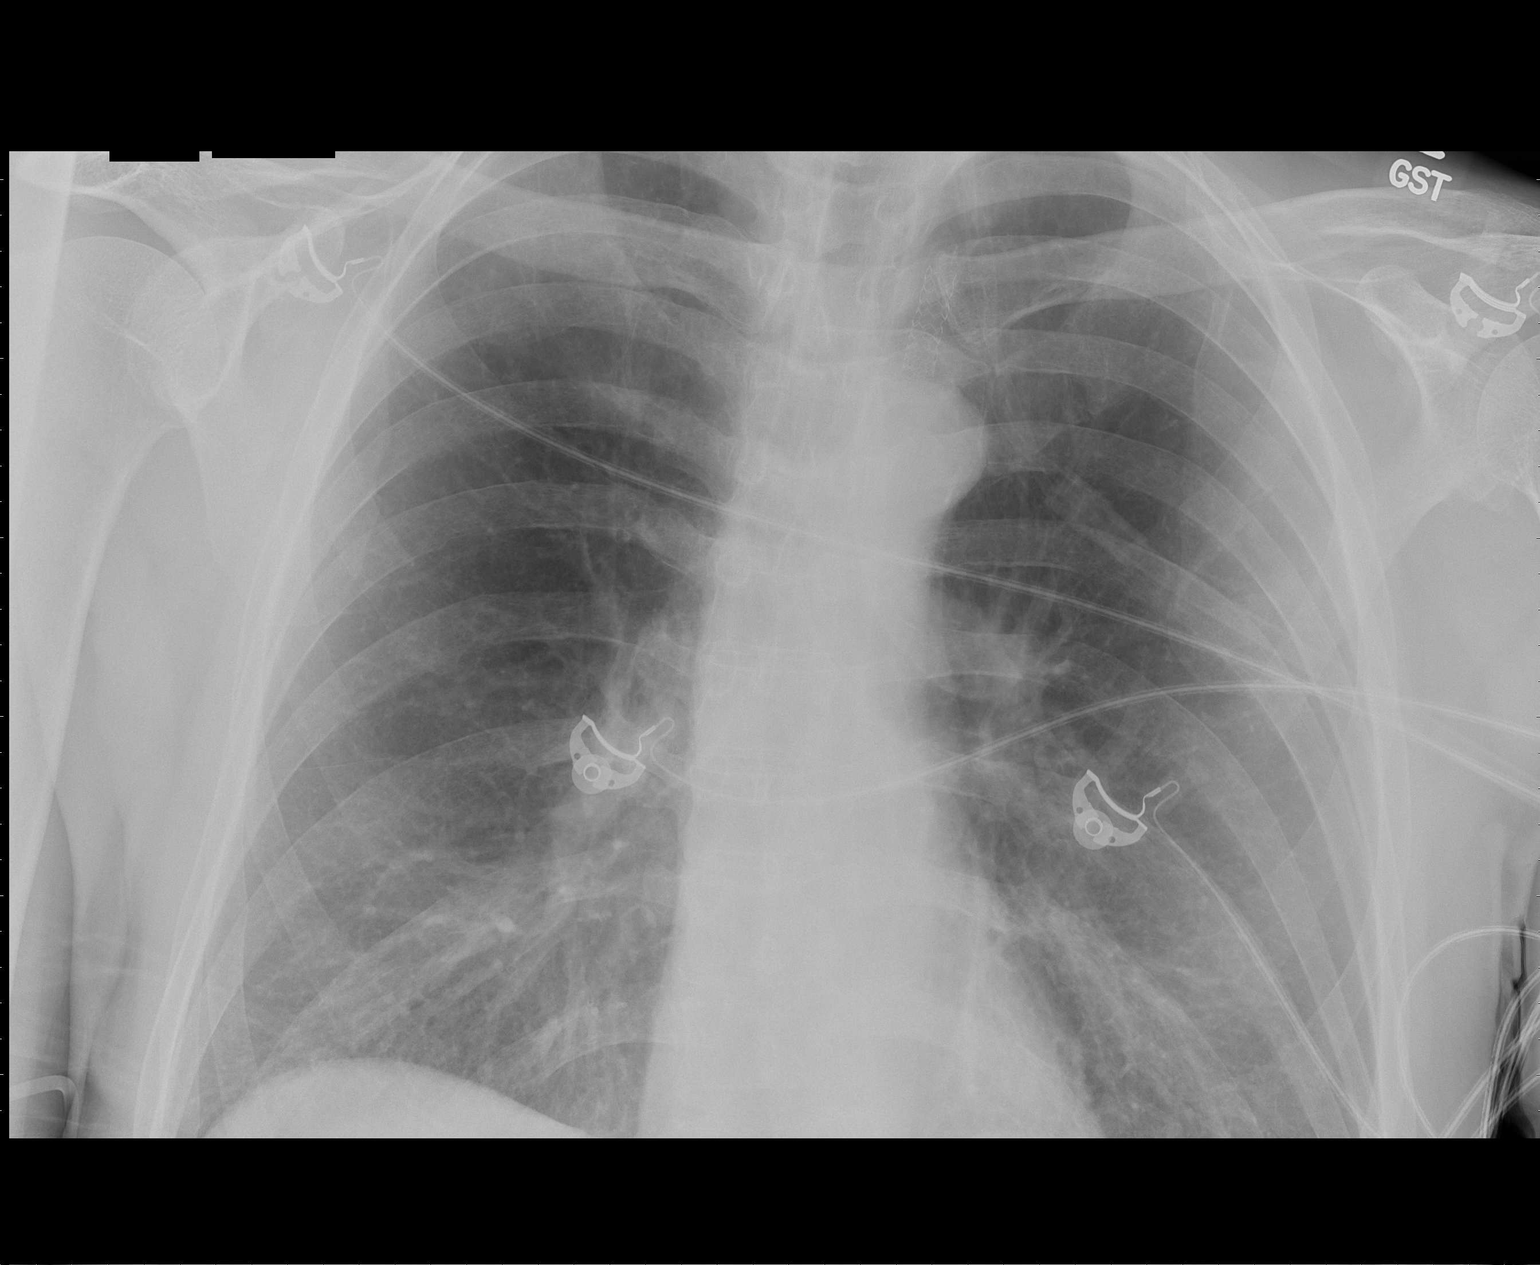

[1 of 1 positions shown; findings below may reference images not displayed]

FINDINGS: Left subclavian vascular stent noted.  Normal heart size
and vascularity.  Mild hyperinflation without focal pneumonia,
edema, effusion or pneumothorax.
IMPRESSION: Stable chest exam no acute disease.

## 2011-06-27 IMAGING — CT CT HEAD W/O CM
1 series · 16 of 30 positions shown, 20 images · non-contrast
Comparison: None.

CLINICAL DATA: Chest pain and left-sided headaches.  Dizziness.

CT HEAD WITHOUT CONTRAST
TECHNIQUE: Contiguous axial images were obtained from the base of
the skull through the vertex without contrast.

[Series 2: head routine 4.8 h37s · axial · 0.50mm/px · z∈[-140,+15]mm · 16 of 36 slices shown, 20 images]
[im 2/36  brain]
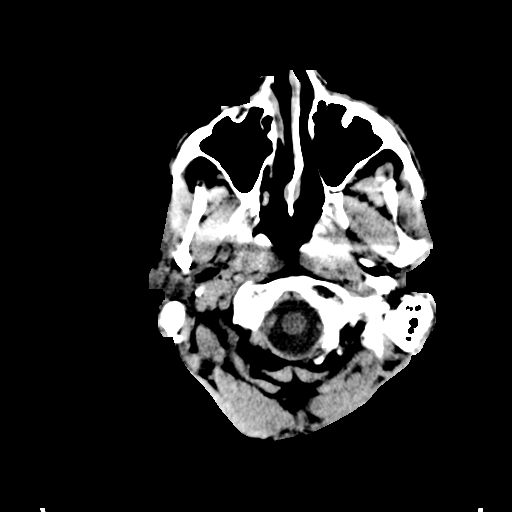
[im 2/36  bone]
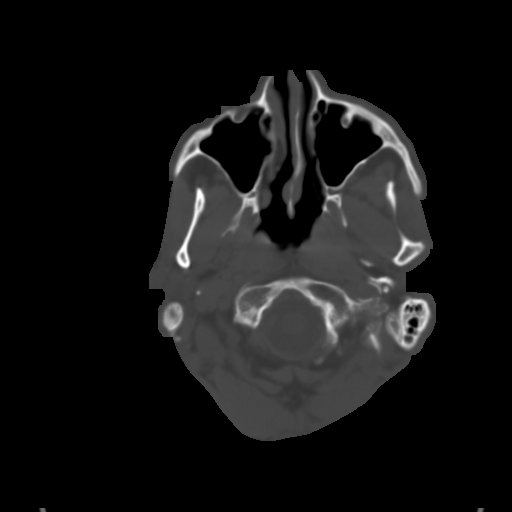
[im 4/36  brain]
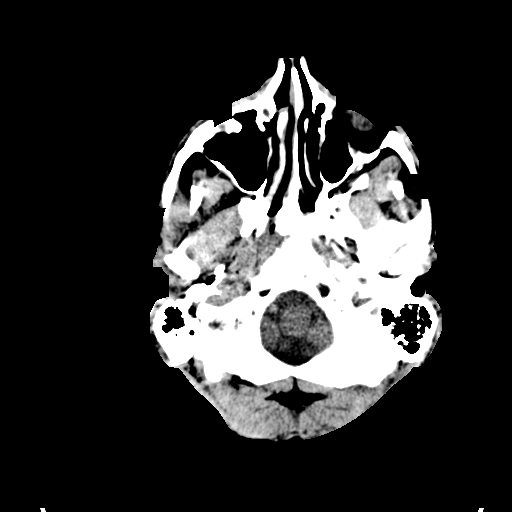
[im 7/36  brain]
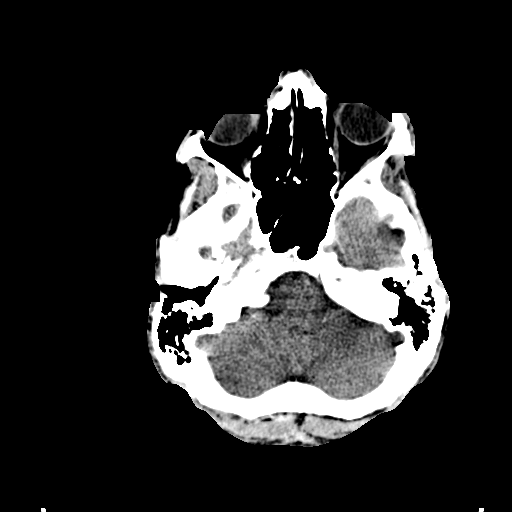
[im 9/36  brain]
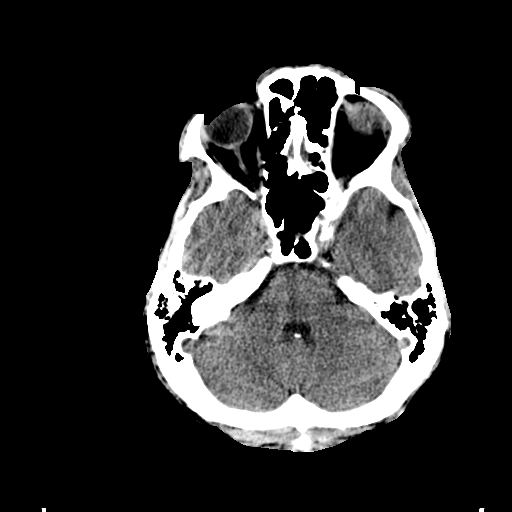
[im 10/36  brain]
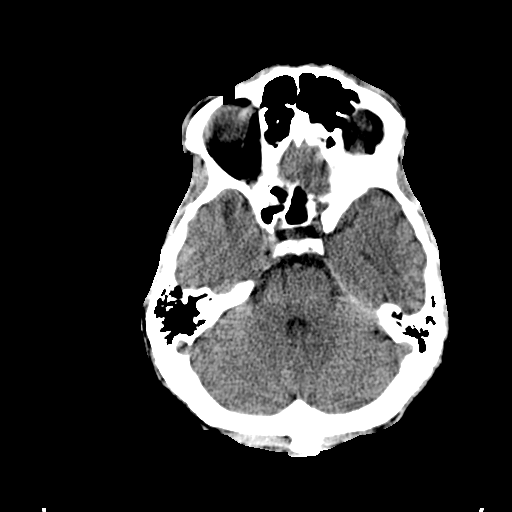
[im 10/36  bone]
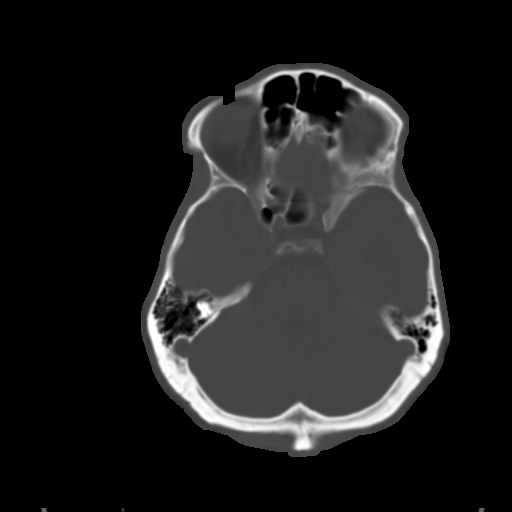
[im 13/36  brain]
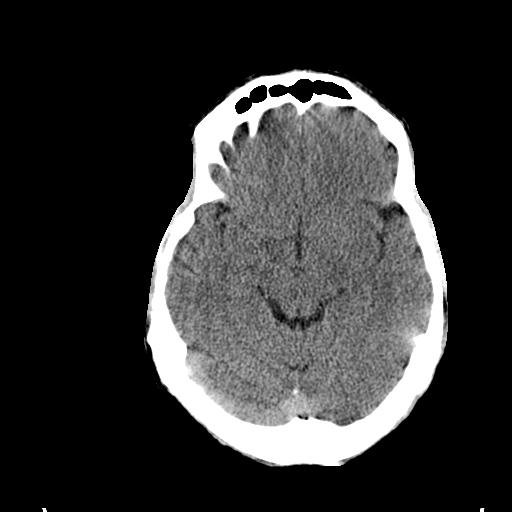
[im 15/36  brain]
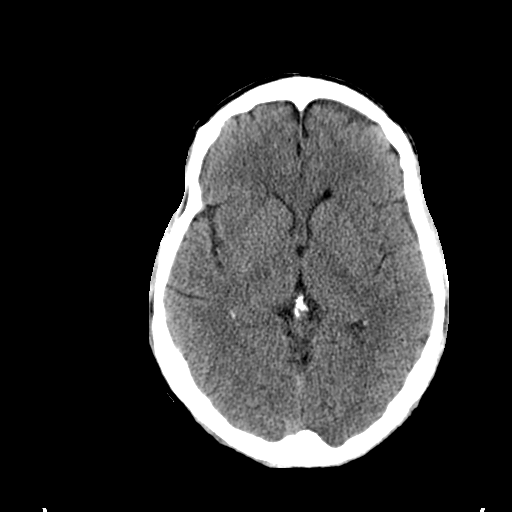
[im 17/36  brain]
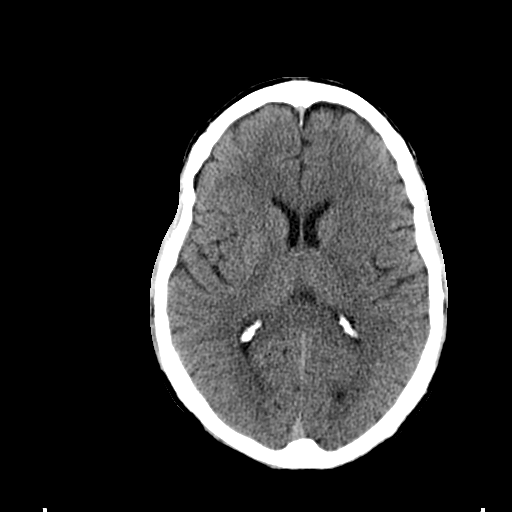
[im 19/36  brain]
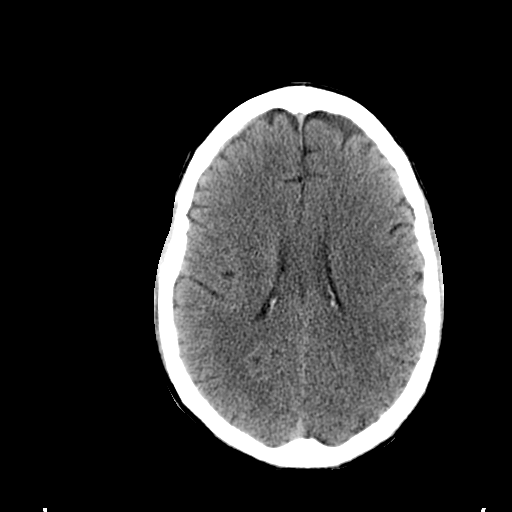
[im 19/36  bone]
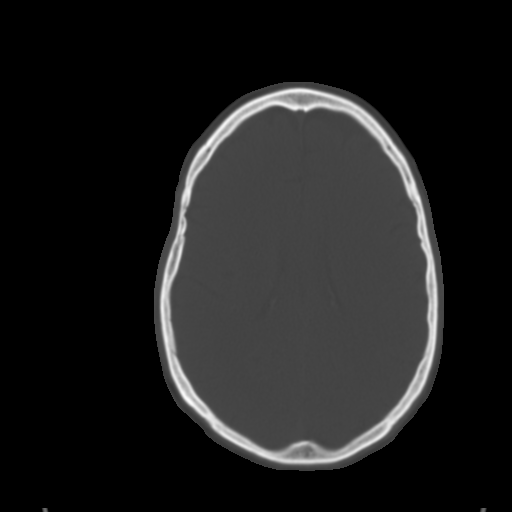
[im 21/36  brain]
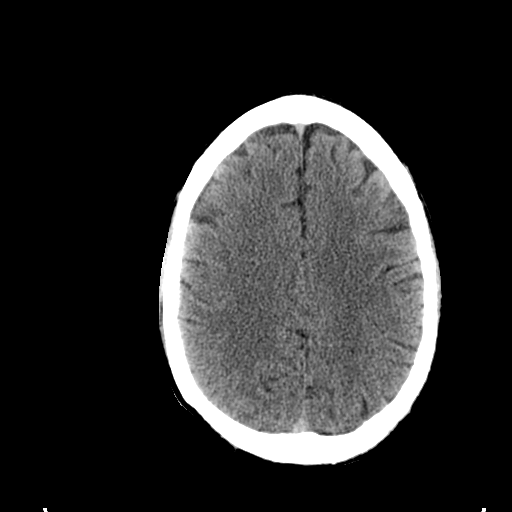
[im 23/36  brain]
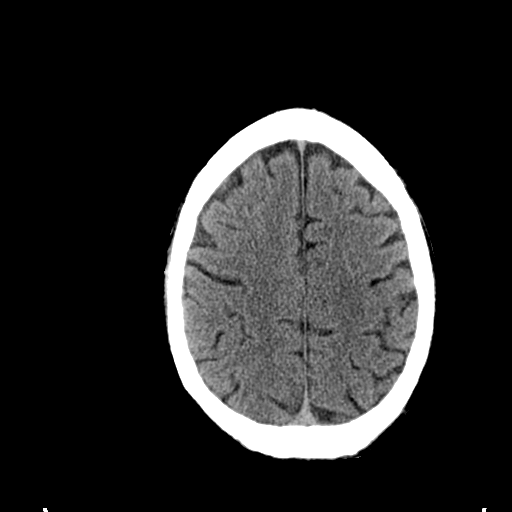
[im 26/36  brain]
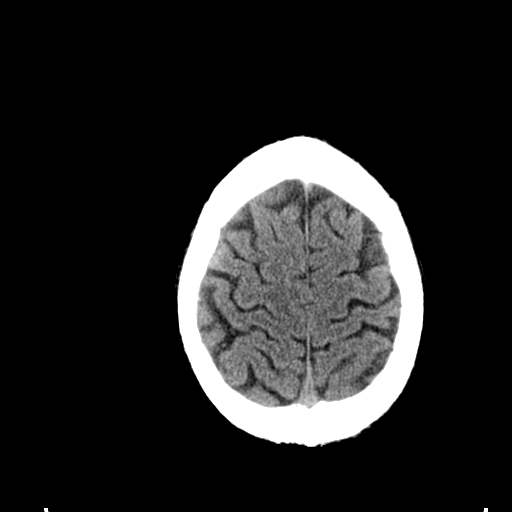
[im 27/36  brain]
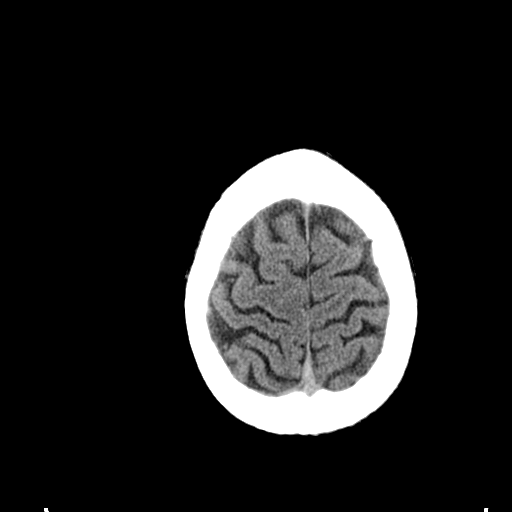
[im 27/36  bone]
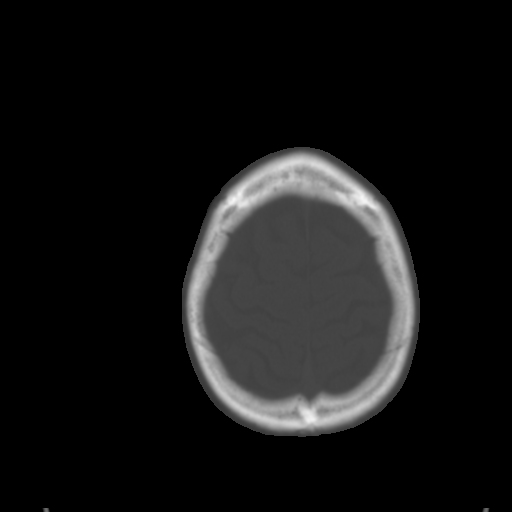
[im 29/36  brain]
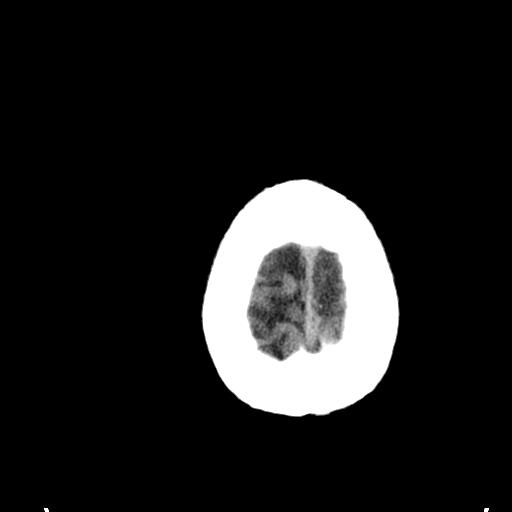
[im 32/36  brain]
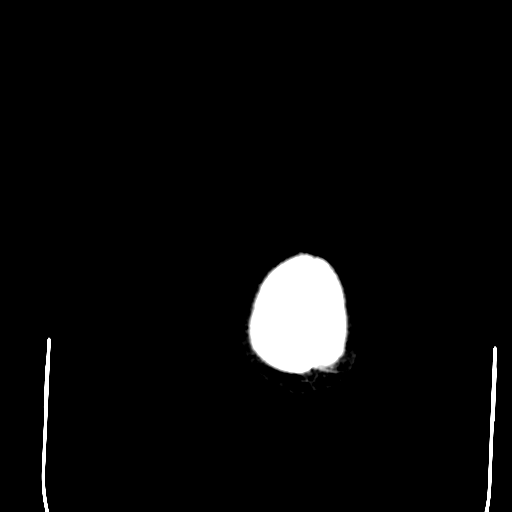
[im 34/36  brain]
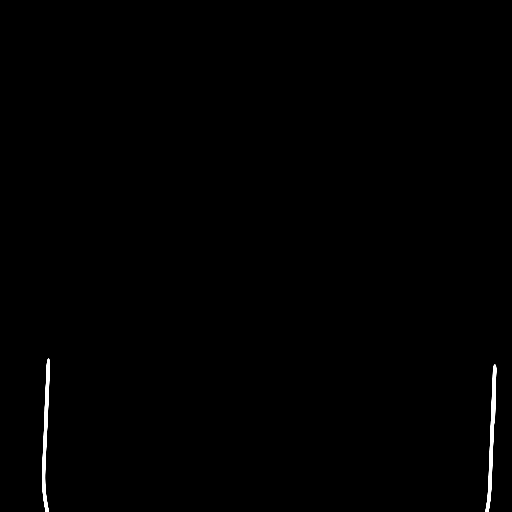

[16 of 30 positions shown; findings below may reference images not displayed]

FINDINGS: There is no evidence of acute intracranial hemorrhage,
mass lesion, brain edema or extra-axial fluid collection.  There is
a 5 mm density within the third ventricle on image 16 compatible
with a colloid cyst.  There is no associated lateral ventricular
dilatation.  There is no periventricular low density.  There is no
evidence of acute cortical infarction.

The visualized paranasal sinuses are clear.  The calvarium is
intact.
IMPRESSION: 1.  Third ventricular colloid cyst.  No associated hydrocephalus.
This is a potentially symptomatic finding, often associated with
headaches and dizziness.  Non emergent neurosurgical consultation
is recommended.
2.  No acute intracranial findings.

## 2011-07-13 LAB — COMPREHENSIVE METABOLIC PANEL
Albumin: 3.4 g/dL (ref 3.4–5.0)
Alkaline Phosphatase: 105 U/L (ref 50–136)
Anion Gap: 3 — ABNORMAL LOW (ref 7–16)
BUN: 17 mg/dL (ref 7–18)
Bilirubin,Total: 0.2 mg/dL (ref 0.2–1.0)
Calcium, Total: 8.1 mg/dL — ABNORMAL LOW (ref 8.5–10.1)
Chloride: 102 mmol/L (ref 98–107)
Co2: 33 mmol/L — ABNORMAL HIGH (ref 21–32)
Creatinine: 1.61 mg/dL — ABNORMAL HIGH (ref 0.60–1.30)
EGFR (African American): 54 — ABNORMAL LOW
EGFR (Non-African Amer.): 46 — ABNORMAL LOW
Glucose: 202 mg/dL — ABNORMAL HIGH (ref 65–99)
Osmolality: 283 (ref 275–301)
Potassium: 3.8 mmol/L (ref 3.5–5.1)
SGOT(AST): 28 U/L (ref 15–37)
SGPT (ALT): 26 U/L
Sodium: 138 mmol/L (ref 136–145)
Total Protein: 6.7 g/dL (ref 6.4–8.2)

## 2011-07-13 LAB — CBC WITH DIFFERENTIAL/PLATELET
Basophil #: 0.1 10*3/uL (ref 0.0–0.1)
Basophil %: 1.1 %
Eosinophil #: 0.2 10*3/uL (ref 0.0–0.7)
Eosinophil %: 3.6 %
HCT: 39.4 % — ABNORMAL LOW (ref 40.0–52.0)
HGB: 13.2 g/dL (ref 13.0–18.0)
Lymphocyte #: 2.2 10*3/uL (ref 1.0–3.6)
Lymphocyte %: 36.5 %
MCH: 31.9 pg (ref 26.0–34.0)
MCHC: 33.4 g/dL (ref 32.0–36.0)
MCV: 96 fL (ref 80–100)
Monocyte #: 0.7 x10 3/mm (ref 0.2–1.0)
Monocyte %: 11.3 %
Neutrophil #: 2.8 10*3/uL (ref 1.4–6.5)
Neutrophil %: 47.5 %
Platelet: 189 10*3/uL (ref 150–440)
RBC: 4.13 10*6/uL — ABNORMAL LOW (ref 4.40–5.90)
RDW: 14.7 % — ABNORMAL HIGH (ref 11.5–14.5)
WBC: 5.9 10*3/uL (ref 3.8–10.6)

## 2011-07-13 LAB — TROPONIN I: Troponin-I: <0.02 ng/mL

## 2011-07-13 LAB — URINALYSIS, COMPLETE
Bacteria: NONE SEEN
Bilirubin,UR: NEGATIVE
Blood: NEGATIVE
Glucose,UR: NEGATIVE mg/dL (ref 0–75)
Ketone: NEGATIVE
Leukocyte Esterase: NEGATIVE
Nitrite: NEGATIVE
Ph: 6 (ref 4.5–8.0)
Protein: NEGATIVE
RBC,UR: 2 /HPF (ref 0–5)
Specific Gravity: 1.013 (ref 1.003–1.030)
Squamous Epithelial: <1
WBC UR: <1 /HPF (ref 0–5)

## 2011-07-13 LAB — SEDIMENTATION RATE: Erythrocyte Sed Rate: 7 mm/hr (ref 0–20)

## 2011-07-13 LAB — CK TOTAL AND CKMB (NOT AT ARMC)
CK, Total: 200 U/L (ref 35–232)
CK-MB: 1.7 ng/mL (ref 0.5–3.6)

## 2011-07-14 ENCOUNTER — Inpatient Hospital Stay: Payer: Self-pay | Admitting: Internal Medicine

## 2011-07-14 LAB — CK TOTAL AND CKMB (NOT AT ARMC)
CK, Total: 137 U/L (ref 35–232)
CK, Total: 121 U/L (ref 35–232)
CK-MB: 1.4 ng/mL (ref 0.5–3.6)
CK-MB: 1.2 ng/mL (ref 0.5–3.6)

## 2011-07-14 LAB — TROPONIN I
Troponin-I: <0.02 ng/mL
Troponin-I: <0.02 ng/mL

## 2011-07-14 LAB — TSH: Thyroid Stimulating Horm: 1.79 u[IU]/mL

## 2011-07-15 LAB — BASIC METABOLIC PANEL
Anion Gap: 7 (ref 7–16)
BUN: 8 mg/dL (ref 7–18)
Calcium, Total: 8.2 mg/dL — ABNORMAL LOW (ref 8.5–10.1)
Chloride: 101 mmol/L (ref 98–107)
Co2: 29 mmol/L (ref 21–32)
Creatinine: 1.06 mg/dL (ref 0.60–1.30)
EGFR (African American): >60
EGFR (Non-African Amer.): >60
Glucose: 216 mg/dL — ABNORMAL HIGH (ref 65–99)
Osmolality: 279 (ref 275–301)
Potassium: 3.8 mmol/L (ref 3.5–5.1)
Sodium: 137 mmol/L (ref 136–145)

## 2011-07-15 LAB — LIPID PANEL
Cholesterol: 136 mg/dL (ref 0–200)
HDL Cholesterol: 22 mg/dL — ABNORMAL LOW (ref 40–60)
Ldl Cholesterol, Calc: 63 mg/dL (ref 0–100)
Triglycerides: 254 mg/dL — ABNORMAL HIGH (ref 0–200)
VLDL Cholesterol, Calc: 51 mg/dL — ABNORMAL HIGH (ref 5–40)

## 2011-07-15 LAB — CBC WITH DIFFERENTIAL/PLATELET
Basophil #: 0 10*3/uL (ref 0.0–0.1)
Basophil %: 1 %
Eosinophil #: 0.2 10*3/uL (ref 0.0–0.7)
Eosinophil %: 3.5 %
HCT: 37 % — ABNORMAL LOW (ref 40.0–52.0)
HGB: 12.7 g/dL — ABNORMAL LOW (ref 13.0–18.0)
Lymphocyte #: 1.7 10*3/uL (ref 1.0–3.6)
Lymphocyte %: 39 %
MCH: 32.6 pg (ref 26.0–34.0)
MCHC: 34.4 g/dL (ref 32.0–36.0)
MCV: 95 fL (ref 80–100)
Monocyte #: 0.4 x10 3/mm (ref 0.2–1.0)
Monocyte %: 9.3 %
Neutrophil #: 2.1 10*3/uL (ref 1.4–6.5)
Neutrophil %: 47.2 %
Platelet: 176 10*3/uL (ref 150–440)
RBC: 3.9 10*6/uL — ABNORMAL LOW (ref 4.40–5.90)
RDW: 15 % — ABNORMAL HIGH (ref 11.5–14.5)
WBC: 4.5 10*3/uL (ref 3.8–10.6)

## 2011-07-15 LAB — HEMOGLOBIN A1C: Hemoglobin A1C: 8.1 % — ABNORMAL HIGH (ref 4.2–6.3)

## 2011-07-19 LAB — CULTURE, BLOOD (SINGLE)

## 2011-07-28 ENCOUNTER — Telehealth: Payer: Self-pay

## 2011-07-28 NOTE — Telephone Encounter (Signed)
Pt called asking to speak with Dr. Mariah Milling.  I explained he is in with a patient at the moment. Pt says he lives in assisted living facility and is experiencing LE edema and weight gain. He says he used to weigh 215 pounds but is up to 260 pounds.  He says he would like to go to hospital and have Dr. Mariah Milling come check on him. I explained Dr. Piedad Climes not be consulted if pt gets admitted.  Instead of hospitalization, I advised pt to try taking lasix 20 mg x 2 tabs over the weekend to see if this helps edema.  He says he wants me to talk to Dr. Mariah Milling first and call him back. If we make med changes, it would need to be faxed to assisted living facility at [phone]. I will call pt back at [phone].

## 2011-07-28 NOTE — Telephone Encounter (Signed)
LMTCB

## 2011-07-28 NOTE — Telephone Encounter (Signed)
Pt called back. I told him to come in Monday 7/22 at 2 pm.  He says Lawanda handles his transportation and I would need to call her. I called Lawanda who asks that I call her back Monday am at [phone]

## 2011-07-28 NOTE — Telephone Encounter (Signed)
Discussed with Dr. Mariah Milling who advised to work pt in on Monday with Dr. Kirke Corin. He feels this is r/t venous insufficiency, therefore increasing diuretics may not help.  I will call pt to advise.

## 2011-07-31 ENCOUNTER — Ambulatory Visit: Payer: Medicaid Other | Admitting: Cardiovascular Disease

## 2011-07-31 NOTE — Telephone Encounter (Signed)
LMTCB

## 2011-07-31 NOTE — Telephone Encounter (Signed)
Pt's caregiver/driver, Rowan Blase, called to say they cannot bring pt to appt today d/t being overbooked.  She asks if this appt is necessary. I explained pt called [place] Friday with c/o worsening LE edema and weight gain and Dr. Mariah Milling felt pt should be seen.  She goes on to say pt was just seen for this same complaint in May and was brought to our office. She says MD tiold her and pt this was not "heart related".  I explained Dr. Mariah Milling is aware of the last visit and conversation held with pt but we have to listen to the pt and consider their symptoms.  She verb. Understanding and will call PCP today to see if pt needs to see us/them.  She will call me back with decision. Cancelled appt for today.

## 2011-08-02 ENCOUNTER — Telehealth: Payer: Self-pay

## 2011-08-02 NOTE — Telephone Encounter (Signed)
I received a call from pt who asks if we can refer to vein and vascular for legs.  He went to PCP for LE edema as planned and asked for referral from PCP. He says PCP refused. According to last Dr. Mariah Milling note, it appears Dr. Mariah Milling was ok with referral and gave pt Dr. Dew/Dr. Eustace Quail phone #. Pt asks that we call to make appt and then call his driver, lawanda, to confirm date/time ok. I told him I would do this.

## 2011-08-04 NOTE — Telephone Encounter (Signed)
Scheduled pt appt with [facility]. Has been scheduled with Dr. Lorretta Harp 08/14/11 at 3:15 pm. I called Lawanda at [phone] and [facility] Will fax notes to Dr. Eustace Quail office at [phone]

## 2011-08-07 NOTE — Telephone Encounter (Signed)
LMTCB

## 2011-08-08 NOTE — Telephone Encounter (Signed)
Pt calling to give another number for lawanda husband Loraine Leriche. [phone].

## 2011-08-08 NOTE — Telephone Encounter (Signed)
LMTCB on Gregory Hall's VM

## 2011-08-08 NOTE — Telephone Encounter (Signed)
LMTCB

## 2011-08-08 NOTE — Telephone Encounter (Signed)
I called pt to tell him about appt we have scheduled with Dr. Lorretta Harp. I also informed him that I have been trying to reach [place], that I have left 3 messages and cannot get her to return my calls. He gave phone to another worker at the group home and I left my name/# with her as well. She will try to get in touch with Lawanda and have her call me back

## 2011-08-09 NOTE — Telephone Encounter (Signed)
Gregory Hall left message on office VM stating she received my messages and pt's PCP did not refer pt to vein and vascular therefore she is not going to take pt to appt  I called her back and had to leave another message explaining Dr, Mariah Milling, not PCP, is referring and I need to speak with someone who can transport pt to appt.

## 2011-08-09 NOTE — Telephone Encounter (Signed)
I attempted to reach [place] again and it went to voice mail again

## 2011-08-09 NOTE — Telephone Encounter (Signed)
Lawanda called back. I explained pt needs to see vein specialist, per Dr. Windell Hummingbird request. She says PCP felt differently and they think pt is "just upset at primary care not referring him to vein specialist". I explained, per Dr. Windell Hummingbird last note, pt needs to be referred to vein specialist and she is standing in the way of this patient's care by refusing to take him to specialist. She says if we referred him in May we should have made appt. i explained Dr. Mariah Milling suggested he f/u with them in May and gave pt the #. She says her husband was with pt and this did not give him this info. I explained we gave someone the #, according to the note and Dr. Mariah Milling is now referring to vascular specialist and pt needs to go.  She says they cannot take him 8/5 so I gave her the # to call and schedule for better date that is best for transportation.She says she will do this.

## 2011-08-11 ENCOUNTER — Telehealth: Payer: Self-pay

## 2011-08-11 NOTE — Telephone Encounter (Signed)
Nikki with [facility] asks that we fax latest office note to [phone]

## 2011-08-23 ENCOUNTER — Observation Stay: Payer: Self-pay | Admitting: Internal Medicine

## 2011-08-23 LAB — COMPREHENSIVE METABOLIC PANEL
Albumin: 3.5 g/dL (ref 3.4–5.0)
Alkaline Phosphatase: 119 U/L (ref 50–136)
Anion Gap: 3 — ABNORMAL LOW (ref 7–16)
BUN: 12 mg/dL (ref 7–18)
Bilirubin,Total: 0.3 mg/dL (ref 0.2–1.0)
Calcium, Total: 8.4 mg/dL — ABNORMAL LOW (ref 8.5–10.1)
Chloride: 98 mmol/L (ref 98–107)
Co2: 35 mmol/L — ABNORMAL HIGH (ref 21–32)
Creatinine: 1.13 mg/dL (ref 0.60–1.30)
EGFR (African American): >60
EGFR (Non-African Amer.): >60
Glucose: 246 mg/dL — ABNORMAL HIGH (ref 65–99)
Osmolality: 280 (ref 275–301)
Potassium: 3.7 mmol/L (ref 3.5–5.1)
SGOT(AST): 28 U/L (ref 15–37)
SGPT (ALT): 28 U/L (ref 12–78)
Sodium: 136 mmol/L (ref 136–145)
Total Protein: 7.2 g/dL (ref 6.4–8.2)

## 2011-08-23 LAB — CBC WITH DIFFERENTIAL/PLATELET
Basophil #: 0 10*3/uL (ref 0.0–0.1)
Basophil %: 0.3 %
Eosinophil #: 0.2 10*3/uL (ref 0.0–0.7)
Eosinophil %: 3.5 %
HCT: 43.9 % (ref 40.0–52.0)
HGB: 14.9 g/dL (ref 13.0–18.0)
Lymphocyte #: 2.2 10*3/uL (ref 1.0–3.6)
Lymphocyte %: 34.7 %
MCH: 32.3 pg (ref 26.0–34.0)
MCHC: 34 g/dL (ref 32.0–36.0)
MCV: 95 fL (ref 80–100)
Monocyte #: 0.7 x10 3/mm (ref 0.2–1.0)
Monocyte %: 10.7 %
Neutrophil #: 3.3 10*3/uL (ref 1.4–6.5)
Neutrophil %: 50.8 %
Platelet: 229 10*3/uL (ref 150–440)
RBC: 4.63 10*6/uL (ref 4.40–5.90)
RDW: 13.9 % (ref 11.5–14.5)
WBC: 6.5 10*3/uL (ref 3.8–10.6)

## 2011-08-23 LAB — APTT: Activated PTT: 30.1 secs (ref 23.6–35.9)

## 2011-08-23 LAB — PROTIME-INR
INR: 0.9
Prothrombin Time: 12 secs (ref 11.5–14.7)

## 2011-08-23 LAB — CK TOTAL AND CKMB (NOT AT ARMC)
CK, Total: 151 U/L (ref 35–232)
CK-MB: 1.7 ng/mL (ref 0.5–3.6)

## 2011-08-23 LAB — TROPONIN I: Troponin-I: <0.02 ng/mL

## 2011-08-24 DIAGNOSIS — I200 Unstable angina: Secondary | ICD-10-CM

## 2011-08-24 LAB — LIPID PANEL
Cholesterol: 96 mg/dL (ref 0–200)
HDL Cholesterol: 19 mg/dL — ABNORMAL LOW (ref 40–60)
Ldl Cholesterol, Calc: 43 mg/dL (ref 0–100)
Triglycerides: 169 mg/dL (ref 0–200)
VLDL Cholesterol, Calc: 34 mg/dL (ref 5–40)

## 2011-08-24 LAB — CBC WITH DIFFERENTIAL/PLATELET
Basophil #: 0.1 10*3/uL (ref 0.0–0.1)
Basophil %: 1.2 %
Eosinophil #: 0.2 10*3/uL (ref 0.0–0.7)
Eosinophil %: 4.5 %
HCT: 40.2 % (ref 40.0–52.0)
HGB: 14.3 g/dL (ref 13.0–18.0)
Lymphocyte #: 2.5 10*3/uL (ref 1.0–3.6)
Lymphocyte %: 45.1 %
MCH: 33.2 pg (ref 26.0–34.0)
MCHC: 35.5 g/dL (ref 32.0–36.0)
MCV: 94 fL (ref 80–100)
Monocyte #: 0.6 x10 3/mm (ref 0.2–1.0)
Monocyte %: 10.6 %
Neutrophil #: 2.1 10*3/uL (ref 1.4–6.5)
Neutrophil %: 38.6 %
Platelet: 198 10*3/uL (ref 150–440)
RBC: 4.3 10*6/uL — ABNORMAL LOW (ref 4.40–5.90)
RDW: 13.6 % (ref 11.5–14.5)
WBC: 5.4 10*3/uL (ref 3.8–10.6)

## 2011-08-24 LAB — BASIC METABOLIC PANEL
Anion Gap: 5 — ABNORMAL LOW (ref 7–16)
BUN: 11 mg/dL (ref 7–18)
Calcium, Total: 8.2 mg/dL — ABNORMAL LOW (ref 8.5–10.1)
Chloride: 99 mmol/L (ref 98–107)
Co2: 34 mmol/L — ABNORMAL HIGH (ref 21–32)
Creatinine: 1.08 mg/dL (ref 0.60–1.30)
EGFR (African American): >60
EGFR (Non-African Amer.): >60
Glucose: 243 mg/dL — ABNORMAL HIGH (ref 65–99)
Osmolality: 283 (ref 275–301)
Potassium: 3.6 mmol/L (ref 3.5–5.1)
Sodium: 138 mmol/L (ref 136–145)

## 2011-08-24 LAB — CK TOTAL AND CKMB (NOT AT ARMC)
CK, Total: 118 U/L (ref 35–232)
CK, Total: 117 U/L (ref 35–232)
CK-MB: 1.3 ng/mL (ref 0.5–3.6)
CK-MB: 1.3 ng/mL (ref 0.5–3.6)

## 2011-08-24 LAB — MAGNESIUM: Magnesium: 1.8 mg/dL

## 2011-08-24 LAB — HEMOGLOBIN A1C: Hemoglobin A1C: 8.3 % — ABNORMAL HIGH (ref 4.2–6.3)

## 2011-08-24 LAB — TROPONIN I
Troponin-I: <0.02 ng/mL
Troponin-I: <0.02 ng/mL

## 2011-08-27 LAB — WOUND CULTURE

## 2011-09-17 ENCOUNTER — Emergency Department: Payer: Self-pay | Admitting: Emergency Medicine

## 2011-09-17 LAB — CBC
HCT: 43.4 % (ref 40.0–52.0)
HGB: 14.8 g/dL (ref 13.0–18.0)
MCH: 31.9 pg (ref 26.0–34.0)
MCHC: 34.1 g/dL (ref 32.0–36.0)
MCV: 94 fL (ref 80–100)
Platelet: 237 10*3/uL (ref 150–440)
RBC: 4.64 10*6/uL (ref 4.40–5.90)
RDW: 13.5 % (ref 11.5–14.5)
WBC: 7.1 10*3/uL (ref 3.8–10.6)

## 2011-09-17 LAB — BASIC METABOLIC PANEL
Anion Gap: 5 — ABNORMAL LOW (ref 7–16)
BUN: 10 mg/dL (ref 7–18)
Calcium, Total: 8.6 mg/dL (ref 8.5–10.1)
Chloride: 100 mmol/L (ref 98–107)
Co2: 33 mmol/L — ABNORMAL HIGH (ref 21–32)
Creatinine: 1.26 mg/dL (ref 0.60–1.30)
EGFR (African American): >60
EGFR (Non-African Amer.): >60
Glucose: 209 mg/dL — ABNORMAL HIGH (ref 65–99)
Osmolality: 281 (ref 275–301)
Potassium: 3.9 mmol/L (ref 3.5–5.1)
Sodium: 138 mmol/L (ref 136–145)

## 2011-09-17 LAB — TROPONIN I: Troponin-I: <0.02 ng/mL

## 2011-09-18 ENCOUNTER — Telehealth: Payer: Self-pay | Admitting: Physical Medicine & Rehabilitation

## 2011-09-18 NOTE — Telephone Encounter (Signed)
This name is not familiar.  Furthermore, why would I be seeing him for esophageal spasms?

## 2011-09-18 NOTE — Telephone Encounter (Signed)
Patient states Dr Riley Kill saw him in hospital as consult in 2012 for esophogeal spasms and  that he could call Dr for appointment if he ever needed to see him.  Patient states he has had 2 back surgeries and is in need of medication management.  Do you want to schedule?  Please advise.

## 2011-09-21 ENCOUNTER — Emergency Department: Payer: Self-pay | Admitting: Emergency Medicine

## 2011-09-27 NOTE — Telephone Encounter (Signed)
Unable to advise patient that Dr did not see in hospital x 2.  Phone account empty.

## 2012-01-10 DIAGNOSIS — K469 Unspecified abdominal hernia without obstruction or gangrene: Secondary | ICD-10-CM

## 2012-01-10 HISTORY — DX: Unspecified abdominal hernia without obstruction or gangrene: K46.9

## 2012-01-10 HISTORY — PX: COLONOSCOPY: SHX174

## 2012-01-16 ENCOUNTER — Ambulatory Visit: Payer: Self-pay | Admitting: Emergency Medicine

## 2012-01-18 LAB — PATHOLOGY REPORT

## 2012-03-20 LAB — URINALYSIS, COMPLETE
Bilirubin,UR: NEGATIVE
Glucose,UR: NEGATIVE mg/dL (ref 0–75)
Hyaline Cast: 47
Leukocyte Esterase: NEGATIVE
Nitrite: NEGATIVE
Ph: 5 (ref 4.5–8.0)
Protein: NEGATIVE
RBC,UR: 1 /HPF (ref 0–5)
Specific Gravity: 1.014 (ref 1.003–1.030)
Squamous Epithelial: 2
WBC UR: 1 /HPF (ref 0–5)

## 2012-03-20 LAB — CK TOTAL AND CKMB (NOT AT ARMC)
CK, Total: 847 U/L — ABNORMAL HIGH (ref 35–232)
CK-MB: 6.5 ng/mL — ABNORMAL HIGH (ref 0.5–3.6)

## 2012-03-20 LAB — COMPREHENSIVE METABOLIC PANEL
Albumin: 3.5 g/dL (ref 3.4–5.0)
Alkaline Phosphatase: 101 U/L (ref 50–136)
Anion Gap: 7 (ref 7–16)
BUN: 55 mg/dL — ABNORMAL HIGH (ref 7–18)
Bilirubin,Total: 0.7 mg/dL (ref 0.2–1.0)
Calcium, Total: 8.3 mg/dL — ABNORMAL LOW (ref 8.5–10.1)
Chloride: 102 mmol/L (ref 98–107)
Co2: 26 mmol/L (ref 21–32)
Creatinine: 2.88 mg/dL — ABNORMAL HIGH (ref 0.60–1.30)
EGFR (African American): 26 — ABNORMAL LOW
EGFR (Non-African Amer.): 23 — ABNORMAL LOW
Glucose: 132 mg/dL — ABNORMAL HIGH (ref 65–99)
Osmolality: 287 (ref 275–301)
Potassium: 4.9 mmol/L (ref 3.5–5.1)
SGOT(AST): 34 U/L (ref 15–37)
SGPT (ALT): 23 U/L (ref 12–78)
Sodium: 135 mmol/L — ABNORMAL LOW (ref 136–145)
Total Protein: 7.3 g/dL (ref 6.4–8.2)

## 2012-03-20 LAB — CBC
HCT: 45 % (ref 40.0–52.0)
HGB: 14.3 g/dL (ref 13.0–18.0)
MCH: 28.7 pg (ref 26.0–34.0)
MCHC: 31.7 g/dL — ABNORMAL LOW (ref 32.0–36.0)
MCV: 91 fL (ref 80–100)
Platelet: 199 10*3/uL (ref 150–440)
RBC: 4.97 10*6/uL (ref 4.40–5.90)
RDW: 18.7 % — ABNORMAL HIGH (ref 11.5–14.5)
WBC: 10.5 10*3/uL (ref 3.8–10.6)

## 2012-03-20 LAB — DRUG SCREEN, URINE
Amphetamines, Ur Screen: NEGATIVE (ref ?–1000)
Barbiturates, Ur Screen: NEGATIVE (ref ?–200)
Benzodiazepine, Ur Scrn: POSITIVE (ref ?–200)
Cannabinoid 50 Ng, Ur ~~LOC~~: NEGATIVE (ref ?–50)
Cocaine Metabolite,Ur ~~LOC~~: NEGATIVE (ref ?–300)
MDMA (Ecstasy)Ur Screen: NEGATIVE (ref ?–500)
Methadone, Ur Screen: NEGATIVE (ref ?–300)
Opiate, Ur Screen: POSITIVE (ref ?–300)
Phencyclidine (PCP) Ur S: NEGATIVE (ref ?–25)
Tricyclic, Ur Screen: NEGATIVE (ref ?–1000)

## 2012-03-20 LAB — ETHANOL
Ethanol %: <0.003 % (ref 0.000–0.080)
Ethanol: <3 mg/dL

## 2012-03-20 LAB — SALICYLATE LEVEL: Salicylates, Serum: 9.7 mg/dL — ABNORMAL HIGH

## 2012-03-20 LAB — PHOSPHORUS: Phosphorus: 3.4 mg/dL (ref 2.5–4.9)

## 2012-03-20 LAB — TROPONIN I: Troponin-I: <0.02 ng/mL

## 2012-03-20 LAB — PRO B NATRIURETIC PEPTIDE: B-Type Natriuretic Peptide: 1523 pg/mL — ABNORMAL HIGH (ref 0–125)

## 2012-03-20 LAB — ACETAMINOPHEN LEVEL: Acetaminophen: <2 ug/mL

## 2012-03-20 LAB — PROTIME-INR
INR: 1
Prothrombin Time: 13.2 secs (ref 11.5–14.7)

## 2012-03-20 LAB — MAGNESIUM: Magnesium: 2.4 mg/dL

## 2012-03-21 ENCOUNTER — Inpatient Hospital Stay: Payer: Self-pay | Admitting: Specialist

## 2012-03-21 LAB — BASIC METABOLIC PANEL
Anion Gap: 0 — ABNORMAL LOW (ref 7–16)
BUN: 45 mg/dL — ABNORMAL HIGH (ref 7–18)
Calcium, Total: 7.9 mg/dL — ABNORMAL LOW (ref 8.5–10.1)
Chloride: 106 mmol/L (ref 98–107)
Co2: 34 mmol/L — ABNORMAL HIGH (ref 21–32)
Creatinine: 2.05 mg/dL — ABNORMAL HIGH (ref 0.60–1.30)
EGFR (African American): 40 — ABNORMAL LOW
EGFR (Non-African Amer.): 34 — ABNORMAL LOW
Glucose: 124 mg/dL — ABNORMAL HIGH (ref 65–99)
Osmolality: 292 (ref 275–301)
Potassium: 5.1 mmol/L (ref 3.5–5.1)
Sodium: 140 mmol/L (ref 136–145)

## 2012-03-22 LAB — BASIC METABOLIC PANEL
Anion Gap: 1 — ABNORMAL LOW (ref 7–16)
BUN: 17 mg/dL (ref 7–18)
Calcium, Total: 7.9 mg/dL — ABNORMAL LOW (ref 8.5–10.1)
Chloride: 108 mmol/L — ABNORMAL HIGH (ref 98–107)
Co2: 32 mmol/L (ref 21–32)
Creatinine: 1.22 mg/dL (ref 0.60–1.30)
EGFR (African American): >60
EGFR (Non-African Amer.): >60
Glucose: 150 mg/dL — ABNORMAL HIGH (ref 65–99)
Osmolality: 286 (ref 275–301)
Potassium: 4.6 mmol/L (ref 3.5–5.1)
Sodium: 141 mmol/L (ref 136–145)

## 2012-03-25 LAB — CULTURE, BLOOD (SINGLE)

## 2012-03-26 LAB — CULTURE, BLOOD (SINGLE)

## 2012-03-29 ENCOUNTER — Inpatient Hospital Stay: Payer: Self-pay | Admitting: Internal Medicine

## 2012-03-29 LAB — BASIC METABOLIC PANEL
Anion Gap: 4 — ABNORMAL LOW (ref 7–16)
BUN: 51 mg/dL — ABNORMAL HIGH (ref 7–18)
Calcium, Total: 8 mg/dL — ABNORMAL LOW (ref 8.5–10.1)
Chloride: 98 mmol/L (ref 98–107)
Co2: 30 mmol/L (ref 21–32)
Creatinine: 4.13 mg/dL — ABNORMAL HIGH (ref 0.60–1.30)
EGFR (African American): 17 — ABNORMAL LOW
EGFR (Non-African Amer.): 15 — ABNORMAL LOW
Glucose: 109 mg/dL — ABNORMAL HIGH (ref 65–99)
Osmolality: 279 (ref 275–301)
Potassium: 5.6 mmol/L — ABNORMAL HIGH (ref 3.5–5.1)
Sodium: 132 mmol/L — ABNORMAL LOW (ref 136–145)

## 2012-03-29 LAB — URINALYSIS, COMPLETE
Bilirubin,UR: NEGATIVE
Glucose,UR: NEGATIVE mg/dL (ref 0–75)
Ketone: NEGATIVE
Leukocyte Esterase: NEGATIVE
Nitrite: NEGATIVE
Ph: 5 (ref 4.5–8.0)
Protein: NEGATIVE
RBC,UR: <1 /HPF (ref 0–5)
Specific Gravity: 1.012 (ref 1.003–1.030)
Squamous Epithelial: 1
WBC UR: NONE SEEN /HPF (ref 0–5)

## 2012-03-29 LAB — CBC WITH DIFFERENTIAL/PLATELET
Basophil #: 0 10*3/uL (ref 0.0–0.1)
Basophil %: 0.3 %
Eosinophil #: 0 10*3/uL (ref 0.0–0.7)
Eosinophil %: 0.2 %
HCT: 40.8 % (ref 40.0–52.0)
HGB: 13.6 g/dL (ref 13.0–18.0)
Lymphocyte #: 0.8 10*3/uL — ABNORMAL LOW (ref 1.0–3.6)
Lymphocyte %: 7.4 %
MCH: 30.7 pg (ref 26.0–34.0)
MCHC: 33.3 g/dL (ref 32.0–36.0)
MCV: 92 fL (ref 80–100)
Monocyte #: 1.3 x10 3/mm — ABNORMAL HIGH (ref 0.2–1.0)
Monocyte %: 12.2 %
Neutrophil #: 8.3 10*3/uL — ABNORMAL HIGH (ref 1.4–6.5)
Neutrophil %: 79.9 %
Platelet: 181 10*3/uL (ref 150–440)
RBC: 4.44 10*6/uL (ref 4.40–5.90)
RDW: 18.2 % — ABNORMAL HIGH (ref 11.5–14.5)
WBC: 10.4 10*3/uL (ref 3.8–10.6)

## 2012-03-29 LAB — POTASSIUM: Potassium: 4.4 mmol/L (ref 3.5–5.1)

## 2012-03-30 LAB — COMPREHENSIVE METABOLIC PANEL
Albumin: 2.5 g/dL — ABNORMAL LOW (ref 3.4–5.0)
Alkaline Phosphatase: 67 U/L (ref 50–136)
Anion Gap: 3 — ABNORMAL LOW (ref 7–16)
BUN: 29 mg/dL — ABNORMAL HIGH (ref 7–18)
Bilirubin,Total: 0.4 mg/dL (ref 0.2–1.0)
Calcium, Total: 7.8 mg/dL — ABNORMAL LOW (ref 8.5–10.1)
Chloride: 108 mmol/L — ABNORMAL HIGH (ref 98–107)
Co2: 31 mmol/L (ref 21–32)
Creatinine: 1.64 mg/dL — ABNORMAL HIGH (ref 0.60–1.30)
EGFR (African American): 52 — ABNORMAL LOW
EGFR (Non-African Amer.): 45 — ABNORMAL LOW
Glucose: 134 mg/dL — ABNORMAL HIGH (ref 65–99)
Osmolality: 291 (ref 275–301)
Potassium: 4.5 mmol/L (ref 3.5–5.1)
SGOT(AST): 28 U/L (ref 15–37)
SGPT (ALT): 14 U/L (ref 12–78)
Sodium: 142 mmol/L (ref 136–145)
Total Protein: 5.5 g/dL — ABNORMAL LOW (ref 6.4–8.2)

## 2012-03-30 LAB — CBC WITH DIFFERENTIAL/PLATELET
Basophil #: 0 10*3/uL (ref 0.0–0.1)
Basophil %: 0.5 %
Eosinophil #: 0.1 10*3/uL (ref 0.0–0.7)
Eosinophil %: 1.1 %
HCT: 36.2 % — ABNORMAL LOW (ref 40.0–52.0)
HGB: 12.7 g/dL — ABNORMAL LOW (ref 13.0–18.0)
Lymphocyte #: 1 10*3/uL (ref 1.0–3.6)
Lymphocyte %: 16 %
MCH: 32.1 pg (ref 26.0–34.0)
MCHC: 35.1 g/dL (ref 32.0–36.0)
MCV: 92 fL (ref 80–100)
Monocyte #: 0.9 x10 3/mm (ref 0.2–1.0)
Monocyte %: 14.9 %
Neutrophil #: 4.2 10*3/uL (ref 1.4–6.5)
Neutrophil %: 67.5 %
Platelet: 160 10*3/uL (ref 150–440)
RBC: 3.96 10*6/uL — ABNORMAL LOW (ref 4.40–5.90)
RDW: 18.8 % — ABNORMAL HIGH (ref 11.5–14.5)
WBC: 6.3 10*3/uL (ref 3.8–10.6)

## 2012-03-30 LAB — MAGNESIUM: Magnesium: 2.1 mg/dL

## 2012-03-31 LAB — BASIC METABOLIC PANEL
Anion Gap: 3 — ABNORMAL LOW (ref 7–16)
BUN: 14 mg/dL (ref 7–18)
Calcium, Total: 8.2 mg/dL — ABNORMAL LOW (ref 8.5–10.1)
Chloride: 108 mmol/L — ABNORMAL HIGH (ref 98–107)
Co2: 30 mmol/L (ref 21–32)
Creatinine: 1.09 mg/dL (ref 0.60–1.30)
EGFR (African American): >60
EGFR (Non-African Amer.): >60
Glucose: 120 mg/dL — ABNORMAL HIGH (ref 65–99)
Osmolality: 283 (ref 275–301)
Potassium: 4.2 mmol/L (ref 3.5–5.1)
Sodium: 141 mmol/L (ref 136–145)

## 2012-04-04 LAB — CULTURE, BLOOD (SINGLE)

## 2012-06-23 LAB — COMPREHENSIVE METABOLIC PANEL
Albumin: 3.7 g/dL (ref 3.4–5.0)
Alkaline Phosphatase: 111 U/L (ref 50–136)
Anion Gap: 3 — ABNORMAL LOW (ref 7–16)
BUN: 14 mg/dL (ref 7–18)
Bilirubin,Total: 0.3 mg/dL (ref 0.2–1.0)
Calcium, Total: 8.6 mg/dL (ref 8.5–10.1)
Chloride: 100 mmol/L (ref 98–107)
Co2: 34 mmol/L — ABNORMAL HIGH (ref 21–32)
Creatinine: 1.3 mg/dL (ref 0.60–1.30)
EGFR (African American): >60
EGFR (Non-African Amer.): 60 — ABNORMAL LOW
Glucose: 190 mg/dL — ABNORMAL HIGH (ref 65–99)
Osmolality: 279 (ref 275–301)
Potassium: 3.9 mmol/L (ref 3.5–5.1)
SGOT(AST): 18 U/L (ref 15–37)
SGPT (ALT): 18 U/L (ref 12–78)
Sodium: 137 mmol/L (ref 136–145)
Total Protein: 7.1 g/dL (ref 6.4–8.2)

## 2012-06-23 LAB — URINALYSIS, COMPLETE
Bacteria: NONE SEEN
Bilirubin,UR: NEGATIVE
Glucose,UR: NEGATIVE mg/dL (ref 0–75)
Ketone: NEGATIVE
Leukocyte Esterase: NEGATIVE
Nitrite: NEGATIVE
Ph: 7 (ref 4.5–8.0)
Protein: NEGATIVE
RBC,UR: <1 /HPF (ref 0–5)
Specific Gravity: 1.003 (ref 1.003–1.030)
Squamous Epithelial: <1
WBC UR: NONE SEEN /HPF (ref 0–5)

## 2012-06-23 LAB — CBC
HCT: 45.7 % (ref 40.0–52.0)
HGB: 15.4 g/dL (ref 13.0–18.0)
MCH: 31 pg (ref 26.0–34.0)
MCHC: 33.8 g/dL (ref 32.0–36.0)
MCV: 92 fL (ref 80–100)
Platelet: 198 10*3/uL (ref 150–440)
RBC: 4.98 10*6/uL (ref 4.40–5.90)
RDW: 15.5 % — ABNORMAL HIGH (ref 11.5–14.5)
WBC: 11.8 10*3/uL — ABNORMAL HIGH (ref 3.8–10.6)

## 2012-06-23 LAB — CK TOTAL AND CKMB (NOT AT ARMC)
CK, Total: 168 U/L (ref 35–232)
CK-MB: 2.9 ng/mL (ref 0.5–3.6)

## 2012-06-23 LAB — TROPONIN I: Troponin-I: <0.02 ng/mL

## 2012-06-23 LAB — LIPASE, BLOOD: Lipase: 149 U/L (ref 73–393)

## 2012-06-24 ENCOUNTER — Inpatient Hospital Stay: Payer: Self-pay | Admitting: Internal Medicine

## 2012-06-25 LAB — BASIC METABOLIC PANEL
Anion Gap: 4 — ABNORMAL LOW (ref 7–16)
BUN: 16 mg/dL (ref 7–18)
Calcium, Total: 9 mg/dL (ref 8.5–10.1)
Chloride: 100 mmol/L (ref 98–107)
Co2: 31 mmol/L (ref 21–32)
Creatinine: 1.11 mg/dL (ref 0.60–1.30)
EGFR (African American): >60
EGFR (Non-African Amer.): >60
Glucose: 204 mg/dL — ABNORMAL HIGH (ref 65–99)
Osmolality: 277 (ref 275–301)
Potassium: 4.6 mmol/L (ref 3.5–5.1)
Sodium: 135 mmol/L — ABNORMAL LOW (ref 136–145)

## 2012-06-25 LAB — CBC WITH DIFFERENTIAL/PLATELET
Basophil #: 0 10*3/uL (ref 0.0–0.1)
Basophil %: 0.2 %
Eosinophil #: 0 10*3/uL (ref 0.0–0.7)
Eosinophil %: 0 %
HCT: 40.9 % (ref 40.0–52.0)
HGB: 13.9 g/dL (ref 13.0–18.0)
Lymphocyte #: 1.1 10*3/uL (ref 1.0–3.6)
Lymphocyte %: 6.8 %
MCH: 31.1 pg (ref 26.0–34.0)
MCHC: 34 g/dL (ref 32.0–36.0)
MCV: 92 fL (ref 80–100)
Monocyte #: 0.9 x10 3/mm (ref 0.2–1.0)
Monocyte %: 5.2 %
Neutrophil #: 14.5 10*3/uL — ABNORMAL HIGH (ref 1.4–6.5)
Neutrophil %: 87.8 %
Platelet: 193 10*3/uL (ref 150–440)
RBC: 4.46 10*6/uL (ref 4.40–5.90)
RDW: 15.2 % — ABNORMAL HIGH (ref 11.5–14.5)
WBC: 16.5 10*3/uL — ABNORMAL HIGH (ref 3.8–10.6)

## 2012-06-25 LAB — MAGNESIUM: Magnesium: 2.4 mg/dL

## 2012-06-29 LAB — CULTURE, BLOOD (SINGLE)

## 2012-07-11 ENCOUNTER — Encounter: Payer: Self-pay | Admitting: *Deleted

## 2012-07-14 ENCOUNTER — Emergency Department: Payer: Self-pay | Admitting: Emergency Medicine

## 2012-07-14 LAB — COMPREHENSIVE METABOLIC PANEL
Albumin: 3.7 g/dL (ref 3.4–5.0)
Alkaline Phosphatase: 113 U/L (ref 50–136)
Anion Gap: 4 — ABNORMAL LOW (ref 7–16)
BUN: 10 mg/dL (ref 7–18)
Bilirubin,Total: 0.6 mg/dL (ref 0.2–1.0)
Calcium, Total: 8.6 mg/dL (ref 8.5–10.1)
Chloride: 100 mmol/L (ref 98–107)
Co2: 34 mmol/L — ABNORMAL HIGH (ref 21–32)
Creatinine: 1.46 mg/dL — ABNORMAL HIGH (ref 0.60–1.30)
EGFR (African American): >60
EGFR (Non-African Amer.): 52 — ABNORMAL LOW
Glucose: 163 mg/dL — ABNORMAL HIGH (ref 65–99)
Osmolality: 278 (ref 275–301)
Potassium: 4 mmol/L (ref 3.5–5.1)
SGOT(AST): 21 U/L (ref 15–37)
SGPT (ALT): 18 U/L (ref 12–78)
Sodium: 138 mmol/L (ref 136–145)
Total Protein: 7.1 g/dL (ref 6.4–8.2)

## 2012-07-14 LAB — CBC
HCT: 45.1 % (ref 40.0–52.0)
HGB: 15.6 g/dL (ref 13.0–18.0)
MCH: 31.7 pg (ref 26.0–34.0)
MCHC: 34.7 g/dL (ref 32.0–36.0)
MCV: 92 fL (ref 80–100)
Platelet: 210 10*3/uL (ref 150–440)
RBC: 4.92 10*6/uL (ref 4.40–5.90)
RDW: 15.9 % — ABNORMAL HIGH (ref 11.5–14.5)
WBC: 6.7 10*3/uL (ref 3.8–10.6)

## 2012-07-14 LAB — TROPONIN I: Troponin-I: <0.02 ng/mL

## 2012-07-18 ENCOUNTER — Ambulatory Visit (INDEPENDENT_AMBULATORY_CARE_PROVIDER_SITE_OTHER): Payer: Medicare Other | Admitting: General Surgery

## 2012-07-18 ENCOUNTER — Encounter: Payer: Self-pay | Admitting: General Surgery

## 2012-07-18 VITALS — BP 130/70 | HR 68 | Resp 14 | Ht 74.0 in | Wt 213.0 lb

## 2012-07-18 DIAGNOSIS — M6208 Separation of muscle (nontraumatic), other site: Secondary | ICD-10-CM

## 2012-07-18 DIAGNOSIS — M6200 Separation of muscle (nontraumatic), unspecified site: Secondary | ICD-10-CM

## 2012-07-18 DIAGNOSIS — R109 Unspecified abdominal pain: Secondary | ICD-10-CM

## 2012-07-18 DIAGNOSIS — K469 Unspecified abdominal hernia without obstruction or gangrene: Secondary | ICD-10-CM | POA: Insufficient documentation

## 2012-07-18 NOTE — Progress Notes (Signed)
Patient ID: Gregory Hall, male   DOB: [birth year], 57 y.o.   MRN: [id number]  Chief Complaint  Patient presents with  . Other    evaluation of abdominal hernia    HPI Gregory Hall is a 57 y.o. male.  Patient here today for hernia evaluation. States he has had a hernia for about 5-6 years.  But since last week he has been having abdominal pain, nausea, vomiting, and diarrhea.  States he use to use nexium but  For 9 months he has used [employer].  States he has lost 12 pounds in past week.  Just the thought or smell of food "makes me sick". [facility] resident at [facility]. A scan was done at [facility] visit June 2014. HPI  Past Medical History  Diagnosis Date  . Hypertension   . Dyslipidemia   . Chronic pain syndrome   . Nutcracker esophagus   . GERD (gastroesophageal reflux disease)   . Degenerative cervical disc   . MRSA (methicillin resistant staph aureus) culture positive 2011  . CAD (coronary artery disease)     s/p PTCA and stent x2  . Hernia 2014  . Chest pain   . Rectus diastasis 07/19/2012    Past Surgical History  Procedure Laterality Date  . Coronary angioplasty with stent placement  2009    stents x2, in [place]  . Tonsillectomy    . Cholecystectomy    . Foot surgery      Right  . Back surgery  2012  . Spine surgery  2012,2013  . Colonoscopy  Jan 2014    [name]    Family History  Problem Relation Age of Onset  . Coronary artery disease Other     Social History History  Substance Use Topics  . Smoking status: Current Every Day Smoker -- 0.50 packs/day for 30 years  . Smokeless tobacco: Not on file  . Alcohol Use: No    No Known Allergies  Current Outpatient Prescriptions  Medication Sig Dispense Refill  . aspirin 81 MG tablet Take 81 mg by mouth daily.        . benztropine (COGENTIN) 1 MG tablet Take 1 mg by mouth 2 (two) times daily as needed.      . Calcium Carbonate (CALCIUM 600 PO) Take 1 tablet by mouth 2 (two)  times daily.      . camphor-menthol (SARNA) lotion Apply topically as needed for itching.      . cetirizine (ZYRTEC) 10 MG tablet Take 10 mg by mouth daily.        . Cholecalciferol (VITAMIN D-3) 5000 UNITS TABS Take 1 tablet by mouth daily.      . citalopram (CELEXA) 20 MG tablet Take 40 mg by mouth daily.       . diazepam (VALIUM) 10 MG tablet Take 10 mg by mouth 3 (three) times daily.        Marland Kitchen docusate sodium (COLACE) 100 MG capsule Take 100 mg by mouth 2 (two) times daily.      . Fluticasone-Salmeterol (ADVAIR DISKUS) 500-50 MCG/DOSE AEPB Inhale 1 puff into the lungs every 12 (twelve) hours.      . furosemide (LASIX) 20 MG tablet Take 20 mg by mouth daily.      Marland Kitchen gabapentin (NEURONTIN) 600 MG tablet Take 600 mg by mouth 3 (three) times daily.      . hydrocortisone-pramoxine (ANALPRAM-HC) 2.5-1 % rectal cream Place 1 application rectally 4 (four) times daily.      Marland Kitchen  isosorbide mononitrate (IMDUR) 30 MG 24 hr tablet Take (1/2) tablet by mouth once daily.  15 tablet  3  . lisinopril (PRINIVIL,ZESTRIL) 5 MG tablet Take 5 mg by mouth daily.      Marland Kitchen LORazepam (ATIVAN) 0.5 MG tablet Take 0.5 mg by mouth 2 (two) times daily as needed for anxiety.      . metFORMIN (GLUCOPHAGE) 500 MG tablet Take 500 mg by mouth 2 (two) times daily with a meal.      . metoCLOPramide (REGLAN) 5 MG tablet Take 5 mg by mouth as needed.      . metoprolol succinate (TOPROL-XL) 25 MG 24 hr tablet Take 25 mg by mouth daily.      . mometasone (ELOCON) 0.1 % cream Apply topically daily.      Marland Kitchen nystatin (MYCOSTATIN/NYSTOP) 100000 UNIT/GM POWD Apply topically 3 (three) times daily.      Marland Kitchen nystatin 100000 UNITS vaginal tablet Place 1 tablet vaginally at bedtime.      . ondansetron (ZOFRAN) 4 MG tablet Take 4 mg by mouth every 12 (twelve) hours as needed for nausea.      Marland Kitchen oxyCODONE (OXYCONTIN) 10 MG 12 hr tablet Take 10 mg by mouth 3 (three) times daily.        . pantoprazole (PROTONIX) 40 MG tablet Take 40 mg by mouth daily.       . polyethylene glycol (MIRALAX / GLYCOLAX) packet Take 17 g by mouth daily.      . potassium chloride (K-DUR) 10 MEQ tablet Take 10 mEq by mouth 2 (two) times daily.      . pregabalin (LYRICA) 100 MG capsule Take 100 mg by mouth 3 (three) times daily.      . Sennosides (SENNA LAX PO) Take 1 tablet by mouth 2 (two) times daily.      . simvastatin (ZOCOR) 20 MG tablet Take 40 mg by mouth at bedtime.       Marland Kitchen tiotropium (SPIRIVA) 18 MCG inhalation capsule Place 18 mcg into inhaler and inhale daily.      Marland Kitchen torsemide (DEMADEX) 20 MG tablet Take 20 mg by mouth 2 (two) times daily.        . traZODone (DESYREL) 150 MG tablet Take 150 mg by mouth at bedtime.        . triamcinolone (KENALOG) 0.1 % cream Apply 1 application topically 2 (two) times daily.        . Vitamins A & D (VITAMIN A & D) ointment Apply topically as needed for dry skin.       No current facility-administered medications for this visit.    Review of Systems Review of Systems  Constitutional: Positive for chills.  Cardiovascular: Negative.   Gastrointestinal: Positive for nausea, abdominal pain and diarrhea.    Blood pressure 130/70, pulse 68, resp. rate 14, height 6\' 2"  (1.88 m), weight 213 lb (96.616 kg).  Physical Exam Physical Exam  Constitutional: He is oriented to person, place, and time. He appears well-developed and well-nourished.  Cardiovascular: Normal rate, regular rhythm and normal heart sounds.   Pulmonary/Chest: Breath sounds normal.  Abdominal: Soft. Bowel sounds are normal.  Neurological: He is alert and oriented to person, place, and time.  Skin: Skin is warm and dry.  No abdominal wall hernias are appreciated. No focal tenderness or mass effect. No hepatomegaly.  Data Reviewed Recent CT completed at [facility] was reviewed. No fascial defect is appreciated between the xiphoid and the umbilicus. A less than 1 cm fat field  defect is present at the umbilicus. No intra-abdominal pathology  identified.  Assessment    Diastasis recti.     Plan    There is no clear reason for the patient's report of abdominal pain or vomiting. No evidence of intestinal obstruction. The umbilical hernia reported on CT is not clinically evident with the patient examined in the standing position.        Earline Mayotte 07/19/2012, 10:21 PM

## 2012-07-18 NOTE — Patient Instructions (Signed)
Patient to return as needed. 

## 2012-07-19 ENCOUNTER — Encounter: Payer: Self-pay | Admitting: General Surgery

## 2012-07-19 DIAGNOSIS — M6208 Separation of muscle (nontraumatic), other site: Secondary | ICD-10-CM

## 2012-07-19 HISTORY — DX: Separation of muscle (nontraumatic), other site: M62.08

## 2012-11-22 ENCOUNTER — Emergency Department: Payer: Self-pay | Admitting: Emergency Medicine

## 2012-11-22 LAB — COMPREHENSIVE METABOLIC PANEL
Albumin: 4.1 g/dL (ref 3.4–5.0)
Alkaline Phosphatase: 111 U/L (ref 50–136)
Anion Gap: 5 — ABNORMAL LOW (ref 7–16)
BUN: 19 mg/dL — ABNORMAL HIGH (ref 7–18)
Bilirubin,Total: 0.4 mg/dL (ref 0.2–1.0)
Calcium, Total: 8.9 mg/dL (ref 8.5–10.1)
Chloride: 98 mmol/L (ref 98–107)
Co2: 30 mmol/L (ref 21–32)
Creatinine: 1.74 mg/dL — ABNORMAL HIGH (ref 0.60–1.30)
EGFR (African American): 49 — ABNORMAL LOW
EGFR (Non-African Amer.): 42 — ABNORMAL LOW
Glucose: 116 mg/dL — ABNORMAL HIGH (ref 65–99)
Osmolality: 270 (ref 275–301)
Potassium: 3.1 mmol/L — ABNORMAL LOW (ref 3.5–5.1)
SGOT(AST): 28 U/L (ref 15–37)
SGPT (ALT): 29 U/L (ref 12–78)
Sodium: 133 mmol/L — ABNORMAL LOW (ref 136–145)
Total Protein: 8 g/dL (ref 6.4–8.2)

## 2012-11-22 LAB — CBC
HCT: 48 % (ref 40.0–52.0)
HGB: 16.6 g/dL (ref 13.0–18.0)
MCH: 32.9 pg (ref 26.0–34.0)
MCHC: 34.6 g/dL (ref 32.0–36.0)
MCV: 95 fL (ref 80–100)
Platelet: 198 10*3/uL (ref 150–440)
RBC: 5.04 10*6/uL (ref 4.40–5.90)
RDW: 13.3 % (ref 11.5–14.5)
WBC: 9.4 10*3/uL (ref 3.8–10.6)

## 2012-11-22 LAB — TROPONIN I: Troponin-I: <0.02 ng/mL

## 2013-03-30 ENCOUNTER — Observation Stay: Payer: Self-pay | Admitting: Internal Medicine

## 2013-03-30 LAB — COMPREHENSIVE METABOLIC PANEL
Albumin: 3.2 g/dL — ABNORMAL LOW (ref 3.4–5.0)
Alkaline Phosphatase: 72 U/L
Anion Gap: 2 — ABNORMAL LOW (ref 7–16)
BUN: 8 mg/dL (ref 7–18)
Bilirubin,Total: 0.5 mg/dL (ref 0.2–1.0)
Calcium, Total: 8.2 mg/dL — ABNORMAL LOW (ref 8.5–10.1)
Chloride: 106 mmol/L (ref 98–107)
Co2: 28 mmol/L (ref 21–32)
Creatinine: 1.15 mg/dL (ref 0.60–1.30)
EGFR (African American): >60
EGFR (Non-African Amer.): >60
Glucose: 89 mg/dL (ref 65–99)
Osmolality: 270 (ref 275–301)
Potassium: 3.3 mmol/L — ABNORMAL LOW (ref 3.5–5.1)
SGOT(AST): 16 U/L (ref 15–37)
SGPT (ALT): 12 U/L (ref 12–78)
Sodium: 136 mmol/L (ref 136–145)
Total Protein: 6.5 g/dL (ref 6.4–8.2)

## 2013-03-30 LAB — APTT: Activated PTT: 31.7 secs (ref 23.6–35.9)

## 2013-03-30 LAB — CBC
HCT: 39.3 % — ABNORMAL LOW (ref 40.0–52.0)
HGB: 13.3 g/dL (ref 13.0–18.0)
MCH: 33 pg (ref 26.0–34.0)
MCHC: 34 g/dL (ref 32.0–36.0)
MCV: 97 fL (ref 80–100)
Platelet: 168 10*3/uL (ref 150–440)
RBC: 4.04 10*6/uL — ABNORMAL LOW (ref 4.40–5.90)
RDW: 13.9 % (ref 11.5–14.5)
WBC: 6.9 10*3/uL (ref 3.8–10.6)

## 2013-03-30 LAB — D-DIMER(ARMC): D-Dimer: 1141 ng/ml

## 2013-03-30 LAB — CK TOTAL AND CKMB (NOT AT ARMC)
CK, Total: 88 U/L
CK, Total: 59 U/L
CK-MB: 1.2 ng/mL (ref 0.5–3.6)
CK-MB: 1.1 ng/mL (ref 0.5–3.6)

## 2013-03-30 LAB — TROPONIN I
Troponin-I: <0.02 ng/mL
Troponin-I: <0.02 ng/mL

## 2013-03-30 LAB — PROTIME-INR
INR: 1
Prothrombin Time: 12.7 secs (ref 11.5–14.7)

## 2013-03-31 DIAGNOSIS — R079 Chest pain, unspecified: Secondary | ICD-10-CM

## 2013-03-31 DIAGNOSIS — I2510 Atherosclerotic heart disease of native coronary artery without angina pectoris: Secondary | ICD-10-CM

## 2013-03-31 DIAGNOSIS — I959 Hypotension, unspecified: Secondary | ICD-10-CM

## 2013-03-31 LAB — URINALYSIS, COMPLETE
Bacteria: NONE SEEN
Bilirubin,UR: NEGATIVE
Blood: NEGATIVE
Glucose,UR: NEGATIVE mg/dL (ref 0–75)
Ketone: NEGATIVE
Leukocyte Esterase: NEGATIVE
Nitrite: NEGATIVE
Ph: 6 (ref 4.5–8.0)
Protein: NEGATIVE
RBC,UR: <1 /HPF (ref 0–5)
Specific Gravity: 1.028 (ref 1.003–1.030)
Squamous Epithelial: 1
WBC UR: 3 /HPF (ref 0–5)

## 2013-03-31 LAB — BASIC METABOLIC PANEL
Anion Gap: 1 — ABNORMAL LOW (ref 7–16)
BUN: 7 mg/dL (ref 7–18)
Calcium, Total: 7.6 mg/dL — ABNORMAL LOW (ref 8.5–10.1)
Chloride: 111 mmol/L — ABNORMAL HIGH (ref 98–107)
Co2: 30 mmol/L (ref 21–32)
Creatinine: 1.11 mg/dL (ref 0.60–1.30)
EGFR (African American): >60
EGFR (Non-African Amer.): >60
Glucose: 72 mg/dL (ref 65–99)
Osmolality: 280 (ref 275–301)
Potassium: 3.6 mmol/L (ref 3.5–5.1)
Sodium: 142 mmol/L (ref 136–145)

## 2013-03-31 LAB — CBC WITH DIFFERENTIAL/PLATELET
Basophil #: 0.1 10*3/uL (ref 0.0–0.1)
Basophil %: 1.1 %
Eosinophil #: 0.3 10*3/uL (ref 0.0–0.7)
Eosinophil %: 4.8 %
HCT: 37.3 % — ABNORMAL LOW (ref 40.0–52.0)
HGB: 12.9 g/dL — ABNORMAL LOW (ref 13.0–18.0)
Lymphocyte #: 2.8 10*3/uL (ref 1.0–3.6)
Lymphocyte %: 48.6 %
MCH: 33.6 pg (ref 26.0–34.0)
MCHC: 34.5 g/dL (ref 32.0–36.0)
MCV: 97 fL (ref 80–100)
Monocyte #: 0.6 x10 3/mm (ref 0.2–1.0)
Monocyte %: 9.8 %
Neutrophil #: 2.1 10*3/uL (ref 1.4–6.5)
Neutrophil %: 35.7 %
Platelet: 156 10*3/uL (ref 150–440)
RBC: 3.83 10*6/uL — ABNORMAL LOW (ref 4.40–5.90)
RDW: 14 % (ref 11.5–14.5)
WBC: 5.8 10*3/uL (ref 3.8–10.6)

## 2013-03-31 LAB — CK TOTAL AND CKMB (NOT AT ARMC)
CK, Total: 58 U/L
CK-MB: 1 ng/mL (ref 0.5–3.6)

## 2013-03-31 LAB — TROPONIN I: Troponin-I: <0.02 ng/mL

## 2013-04-09 ENCOUNTER — Encounter: Payer: Self-pay | Admitting: Cardiovascular Disease

## 2013-04-09 ENCOUNTER — Encounter (INDEPENDENT_AMBULATORY_CARE_PROVIDER_SITE_OTHER): Payer: Self-pay

## 2013-04-09 ENCOUNTER — Ambulatory Visit (INDEPENDENT_AMBULATORY_CARE_PROVIDER_SITE_OTHER): Payer: Medicare Other | Admitting: Cardiovascular Disease

## 2013-04-09 VITALS — BP 108/58 | HR 95 | Ht 74.0 in | Wt 185.2 lb

## 2013-04-09 DIAGNOSIS — R609 Edema, unspecified: Secondary | ICD-10-CM

## 2013-04-09 DIAGNOSIS — Z8679 Personal history of other diseases of the circulatory system: Secondary | ICD-10-CM

## 2013-04-09 DIAGNOSIS — I2510 Atherosclerotic heart disease of native coronary artery without angina pectoris: Secondary | ICD-10-CM

## 2013-04-09 DIAGNOSIS — F17200 Nicotine dependence, unspecified, uncomplicated: Secondary | ICD-10-CM

## 2013-04-09 DIAGNOSIS — E785 Hyperlipidemia, unspecified: Secondary | ICD-10-CM

## 2013-04-09 DIAGNOSIS — R079 Chest pain, unspecified: Secondary | ICD-10-CM

## 2013-04-09 MED ORDER — NITROGLYCERIN 0.4 MG SL SUBL: 0.4000 mg | SUBLINGUAL_TABLET | SUBLINGUAL | Status: DC | PRN

## 2013-04-09 NOTE — Assessment & Plan Note (Signed)
Blood pressure is well controlled on today's visit. No changes made to the medications. 

## 2013-04-09 NOTE — Patient Instructions (Signed)
You are doing well. No medication changes were made.  Call 1-800 Quit Now for patches  Please call us if you have new issues that need to be addressed before your next appt.  Your physician wants you to follow-up in: 6 months.  You will receive a reminder letter in the mail two months in advance. If you don't receive a letter, please call our office to schedule the follow-up appointment.

## 2013-04-09 NOTE — Assessment & Plan Note (Signed)
Currently with no symptoms of angina. No further workup at this time. Continue current medication regimen. 

## 2013-04-09 NOTE — Assessment & Plan Note (Signed)
Recent chest pain and headache with hospitalization. Cardiac workup negative, atypical symptoms. No further testing at this time. He denies any further chest pain since discharge.

## 2013-04-09 NOTE — Progress Notes (Signed)
Patient ID: Gregory Hall, male    DOB: 1955/03/05, 58 y.o.   MRN: [id number]  HPI Comments: 58 yo male with CAD, PCI of the LAD in 2010, repeat cath in 03/2009, hyperlipidemia, HTN, psychiatric hx, long smoking hx who continues to smoke , previous  treatment for MRSA,  seen in consultation [facility] for chest pain 10/30/1009, recent hospital admission March 2015 for chest pain presents for post hospital followup. Prior stenting to his left subclavian artery    history  2 back surgeries. He continues to have back pain.  Previously had significant leg swelling with weeping sores.   He spends much of his time with his legs down.   Admitted to the hospital 03/30/2013 with chest pain, positive d-dimer. CT scan showed no PE, bilateral emphysema, 6 mm spiculated nodule in the right upper lobe Cardiac workup was negative with normal EKG, negative cardiac enzymes. On exam his symptoms were atypical in nature. Blood pressure was low after receiving pain medication in the hospital. This resolved by holding the pain medication. Discharged on 03/31/2013. Stress test not performed given atypical nature of his symptoms. Recommended that he stop smoking   He has a h/o of significant esophageal pathology and spasm with erosive gastritis/GERD per his report.  Recent lab work showing total cholesterol 124, LDL 65, hemoglobin A1c 6.3   EKG: NSR with rate of 95 bpm, T wave ABN in anterolateral leads, IRBBB      Outpatient Encounter Prescriptions as of 04/09/2013  Medication Sig  . aspirin 81 MG tablet Take 81 mg by mouth daily.    . benztropine (COGENTIN) 1 MG tablet Take 1 mg by mouth 2 (two) times daily as needed.  . Calcium Carbonate (CALCIUM 600 PO) Take 1 tablet by mouth 2 (two) times daily.  . camphor-menthol (SARNA) lotion Apply topically as needed for itching.  . cetirizine (ZYRTEC) 10 MG tablet Take 10 mg by mouth daily.    . Cholecalciferol (VITAMIN D-3) 5000 UNITS TABS Take 1 tablet by mouth daily.   . citalopram (CELEXA) 20 MG tablet Take 40 mg by mouth daily.   . diazepam (VALIUM) 10 MG tablet Take 10 mg by mouth 3 (three) times daily.    Marland Kitchen docusate sodium (COLACE) 100 MG capsule Take 100 mg by mouth 2 (two) times daily.  . Fluticasone-Salmeterol (ADVAIR DISKUS) 500-50 MCG/DOSE AEPB Inhale 1 puff into the lungs every 12 (twelve) hours.  . furosemide (LASIX) 20 MG tablet Take 20 mg by mouth daily.  Marland Kitchen gabapentin (NEURONTIN) 600 MG tablet Take 600 mg by mouth 3 (three) times daily.  . hydrocortisone-pramoxine (ANALPRAM-HC) 2.5-1 % rectal cream Place 1 application rectally 4 (four) times daily.  . isosorbide mononitrate (IMDUR) 30 MG 24 hr tablet Take (1/2) tablet by mouth once daily.  Marland Kitchen lisinopril (PRINIVIL,ZESTRIL) 5 MG tablet Take 5 mg by mouth daily.  . metFORMIN (GLUCOPHAGE) 500 MG tablet Take 500 mg by mouth 2 (two) times daily with a meal.  . metoCLOPramide (REGLAN) 5 MG tablet Take 5 mg by mouth as needed.  . metoprolol succinate (TOPROL-XL) 25 MG 24 hr tablet Take 25 mg by mouth daily.  . mometasone (ELOCON) 0.1 % cream Apply topically daily.  Marland Kitchen nystatin (MYCOSTATIN/NYSTOP) 100000 UNIT/GM POWD Apply topically 3 (three) times daily.  Marland Kitchen nystatin 100000 UNITS vaginal tablet Place 1 tablet vaginally at bedtime.  . ondansetron (ZOFRAN) 4 MG tablet Take 4 mg by mouth every 12 (twelve) hours as needed for nausea.  Marland Kitchen oxyCODONE (OXYCONTIN)  10 MG 12 hr tablet Take 10 mg by mouth 3 (three) times daily.    . pantoprazole (PROTONIX) 40 MG tablet Take 40 mg by mouth daily.  . polyethylene glycol (MIRALAX / GLYCOLAX) packet Take 17 g by mouth daily.  . potassium chloride (K-DUR) 10 MEQ tablet Take 10 mEq by mouth 2 (two) times daily.  . pregabalin (LYRICA) 100 MG capsule Take 100 mg by mouth 3 (three) times daily.  . Sennosides (SENNA LAX PO) Take 1 tablet by mouth 2 (two) times daily.  . simvastatin (ZOCOR) 20 MG tablet Take 40 mg by mouth at bedtime.   Marland Kitchen tiotropium (SPIRIVA) 18 MCG inhalation  capsule Place 18 mcg into inhaler and inhale daily.  Marland Kitchen torsemide (DEMADEX) 20 MG tablet Take 20 mg by mouth 2 (two) times daily.    . traZODone (DESYREL) 150 MG tablet Take 150 mg by mouth at bedtime.    . triamcinolone (KENALOG) 0.1 % cream Apply 1 application topically 2 (two) times daily.    . Vitamins A & D (VITAMIN A & D) ointment Apply topically as needed for dry skin.    Review of Systems  Constitutional: Negative.   HENT: Negative.   Eyes: Negative.   Respiratory: Negative.   Cardiovascular: Negative.   Gastrointestinal: Negative.   Endocrine: Negative.   Musculoskeletal: Negative.   Skin: Positive for color change.       Skin changes of the lower extremities with weeping sores  Allergic/Immunologic: Negative.   Neurological: Negative.   Hematological: Negative.   Psychiatric/Behavioral: Negative.   All other systems reviewed and are negative.    BP 108/58  Pulse 95  Ht 6\' 2"  (1.88 m)  Wt 185 lb 4 oz (84.029 kg)  BMI 23.77 kg/m2  Physical Exam  Nursing note and vitals reviewed. Constitutional: He is oriented to person, place, and time. He appears well-developed and well-nourished.  HENT:  Head: Normocephalic.  Nose: Nose normal.  Mouth/Throat: Oropharynx is clear and moist.  Eyes: Conjunctivae are normal. Pupils are equal, round, and reactive to light.  Neck: Normal range of motion. Neck supple. No JVD present.  Cardiovascular: Normal rate, regular rhythm, S1 normal, S2 normal, normal heart sounds and intact distal pulses.  Exam reveals no gallop and no friction rub.   No murmur heard. Trace to 1+ nonpitting edema bilaterally to the mid shins.  Pulmonary/Chest: Effort normal and breath sounds normal. No respiratory distress. He has no wheezes. He has no rales. He exhibits no tenderness.  Abdominal: Soft. Bowel sounds are normal. He exhibits no distension. There is no tenderness.  Musculoskeletal: Normal range of motion. He exhibits no edema and no tenderness.   Lymphadenopathy:    He has no cervical adenopathy.  Neurological: He is alert and oriented to person, place, and time. Coordination normal.  Skin: Skin is warm and dry. No rash noted. No erythema.  Skin changes of the lower extremities noted to the mid shins or higher. Numerous sores noted.  Psychiatric: He has a normal mood and affect. His behavior is normal. Judgment and thought content normal.      Assessment and Plan

## 2013-04-09 NOTE — Assessment & Plan Note (Signed)
We have encouraged him to continue to work on weaning his cigarettes and smoking cessation. He will continue to work on this and does not want any assistance with chantix.  

## 2013-04-09 NOTE — Assessment & Plan Note (Signed)
Edema seen previously in 2013, now resolved.

## 2013-04-22 ENCOUNTER — Ambulatory Visit: Payer: Self-pay | Admitting: Ophthalmology

## 2013-05-06 ENCOUNTER — Ambulatory Visit: Payer: Self-pay | Admitting: Ophthalmology

## 2013-10-19 ENCOUNTER — Emergency Department: Payer: Self-pay | Admitting: Emergency Medicine

## 2013-10-28 ENCOUNTER — Emergency Department: Payer: Self-pay | Admitting: Emergency Medicine

## 2013-10-28 LAB — URINALYSIS, COMPLETE
Bacteria: NONE SEEN
Bilirubin,UR: NEGATIVE
Blood: NEGATIVE
Glucose,UR: NEGATIVE mg/dL (ref 0–75)
Ketone: NEGATIVE
Leukocyte Esterase: NEGATIVE
Nitrite: NEGATIVE
Ph: 6 (ref 4.5–8.0)
Protein: NEGATIVE
RBC,UR: <1 /HPF (ref 0–5)
Specific Gravity: 1.002 (ref 1.003–1.030)
Squamous Epithelial: NONE SEEN
WBC UR: NONE SEEN /HPF (ref 0–5)

## 2014-04-28 NOTE — Discharge Summary (Signed)
PATIENT NAME:  Gregory Hall, Gregory Hall MR#:  [id number] DATE OF BIRTH:  06-02-1955  DATE OF ADMISSION:  08/23/2011 DATE OF DISCHARGE:  08/24/2011  PRIMARY CARE PHYSICIAN: Evelene Croon, MD   DISCHARGE DIAGNOSES:  1. Chest pain secondary to esophageal spasm.  2. Left forearm cellulitis secondary to tick bite.  3. Chronic pain syndrome.  4. Tobacco abuse.   CONSULTANTS: None.   LABORATORY, DIAGNOSTIC AND RADIOLOGICAL DATA: Chest x-ray showed hyperinflation consistent with chronic obstructive pulmonary disease. No acute abnormalities. Forearm x-ray showed no foreign body. A Myoview nuclear scan showed ejection fraction of 55% with no reversible ischemia.   ADMITTING HISTORY AND PHYSICAL: Please see detailed History and Physical dictated on 08/23/2011. In brief, the patient is a 59 year old Caucasian male with history of coronary artery disease, chronic obstructive pulmonary disease, esophageal spasms, presented to the Emergency Room complaining of some chest pain and left forearm tick bite which did not respond to Keflex treatment for four days. The patient was admitted for further work-up of his chest pain.   HOSPITAL COURSE:  1. Chest pain: The patient was admitted onto a telemetry floor, had three sets of cardiac enzymes were in the normal range. The patient had a nuclear scan done which showed ejection fraction of 55% with no ischemic changes. The patient did have history of esophageal spasms, and this was thought to be the cause of the patient's chest pain.  2. Tick bite: The patient did have a tick bite four weeks prior on his left forearm, was treated with Keflex for four days as outpatient but still had some erythema and tenderness and is being discharged home on Bactrim for a week for the mild cellulitis the patient has. He had an x-ray of the forearm which showed no foreign body.  3. Chronic pain syndrome: The patient was on oxycodone at home, did ask for Dilaudid during the hospital stay; but  the patient was continued on oxycodone with good control of the pain and is being discharged home without any change in his pain medications.   On the day of discharge, the patient was examined and showed vital signs with temperature of 97.6, pulse of 84, blood pressure 122/74, with cardiac examination being normal. He is being discharged to home in a stable condition.   DISCHARGE MEDICATIONS:  1. Citalopram 20 mg oral once a day.  2. Aspirin 81 mg oral once a day.  3. Simvastatin 40 mg oral once a day at bedtime.  4. Isosorbide mononitrate 30 mg oral, 1/2 tablet once a day.  5. Cymbalta 60 mg oral b.i.d.   6. Nexium 40 mg oral b.i.d. 7. Torsemide 20 mg oral b.i.d.  8. Gabapentin 600 mg oral t.i.d.  9. Diazepam 10 mg oral t.i.d.  10. Trazodone 150 mg oral once a day.  11. Benztropin 1 mg oral b.i.d.  12. Sarna lotion apply topically to affected area as needed.  13. Nitrostat 0.4 mg sublingual as needed for chest pain.  14. Ondansetron 4 mg oral tablet b.i.d. as needed.  15. Potassium chloride 10 mEq oral tablet b.i.d.  16. Lisinopril 5 mg oral once a day.  17. Metoprolol tartrate 25 mg oral once a day.  18. Zyrtec 10 mg oral once a day.  19. Bactrim DS 1 tablet orally b.i.d. for one week.   DISCHARGE INSTRUCTIONS: The patient is being discharged back to his group home. He will be on a cardiac diet with activity as tolerated with assistance. The patient is to return to the  Emergency Room or call his doctor if his cellulitis of the left forearm worsens in any way. He will follow up with his primary care physician in a week. No change in home medications except the new medication of Bactrim.   This plan was discussed with the patient, who verbalized understanding, and is okay with the plan.    TIME SPENT: Time spent on the day of discharge in coordination of care was 35 minutes.  ____________________________ Molinda Bailiff Jannie Doyle, MD srs:cbb D: 08/25/2011 12:55:18 ET T: 08/25/2011 15:06:40  ET JOB#: [id number]  cc: Wardell Heath R. Maedell Hedger, MD, <Dictator> Meindert A. Lacie Scotts, MD Orie Fisherman MD ELECTRONICALLY SIGNED 08/26/2011 12:29

## 2014-04-28 NOTE — H&P (Signed)
PATIENT NAME:  Gregory Hall, Gregory Hall MR#:  [id number] DATE OF BIRTH:  05-26-1955  DATE OF ADMISSION:  08/23/2011  ADMITTING PHYSICIAN: Dr. Enid Baas    PRIMARY MD: Dr. Lacie Scotts   CHIEF COMPLAINT: Left forearm pain and also chest pain.   HISTORY OF PRESENT ILLNESS: Gregory Hall is a 59 year old Caucasian male who is a resident of [facility] with past medical history significant for coronary artery disease, COPD, ongoing smoking, hypertension, back surgery with chronic back pain with radiculopathy on narcotic pain medications who presents with the above-mentioned complaints. The patient says he found an impacted tick on the left forearm about four or five days ago and tried to take it out with tweezers but had it broken and the other half is impacted within the hand. There is a healing ulcer with surrounding erythema on his forearm. He was treated with Keflex for four days but has increasing pain and slightly worsened swelling since yesterday so he presented to the ER. He also complains of on and off intermittent chest pains and had an episode while he was here. He says he's been seeing Dr. Mariah Milling for the same. He has not had recent stress test and he also has esophageal spasms so it really makes it hard for him to differentiate what kind of pain he has now. Each time the pain happens it lasts for 10 to 15 minutes and has been happening about four times per week. He takes nitroglycerin pills and that helps relieve the pain.   PAST MEDICAL HISTORY: 1. Hyperlipidemia.  2. Hypertension.  3. Coronary artery disease, status post two stents in 2010.  4. Chronic obstructive pulmonary disease.  5. Ongoing smoking.  6. Spinal stenosis, status post lumbar surgery. 7. Depression and anxiety.  8. Chronic pain, on narcotics.  9. Gastroesophageal reflux disease.  10. Esophageal spasms.   PAST SURGICAL HISTORY:  1. Tonsillectomy.  2. 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 3. Cholecystectomy.  4. Cardiac stents placement.  5. Lumbar surgery twice.  ALLERGIES TO MEDICATIONS: Intolerant to fentanyl as it causes nausea and vomiting.   CURRENT MEDICATIONS AT HOME:  1. A and D ointment as needed.  2. Aspirin 81 mg p.o. daily.  3. Benztropine 1 mg p.o. b.i.d.  4. Celexa 20 mg p.o. daily.  5. Cymbalta 60 mg p.o. b.i.d.  6. Diazepam 10 mg p.o. t.i.d.  7. Gabapentin 600 mg p.o. t.i.d.  8. Isosorbide mononitrate 15 mg p.o. daily.  9. Lisinopril 5 mg p.o. daily.  10. Metoprolol 25 mg p.o. daily.  11. Mometasone 0.1% topical cream as needed once a day.  12. Nexium 40 mg p.o. b.i.d.  13. Nitrostat 0.4 mg sublingual tablet as needed for chest pain.  14. Zofran 4 mg p.o. b.i.d. as needed for nausea and vomiting.  15. Potassium chloride 10 mEq p.o. b.i.d.  16. ProAir inhaler every six hours as needed.  17. Sarna lotion apply to affected area as needed for itching.  18. Simvastatin 20 mg p.o. daily.  19. Torsemide 20 p.o. b.i.d.  20. Trazodone 150 mg p.o. at bedtime.  21. Triamcinolone 0.1% topical cream as needed to back and legs twice a day.  22. Zyrtec 10 mg p.o. daily. 23. Oxycodone 20 mg in the morning and 10 mg at bedtime.   SOCIAL HISTORY: The patient is currently a resident of [facility]. Denies any alcohol use. Smokes about 5 to 6 cigarettes per day.  FAMILY HISTORY: Mom had heart disease and also used to smoke more than 5 packs per day and had emphysema. Father is 64 and has dementia.   REVIEW OF SYSTEMS: CONSTITUTIONAL: Positive for fever. No fatigue or weakness. EYES: Increased blurred vision. No glaucoma or cataracts. ENT: No tinnitus, ear pain, hearing loss, epistaxis, or discharge. RESPIRATORY: No cough. No wheezing. Positive for history of COPD. CARDIOVASCULAR: Positive for chest pain. No orthopnea, edema, arrhythmia, palpitations, or syncope. GI: No nausea, vomiting, diarrhea, abdominal pain. Positive  for constipation. GU: No dysuria, hematuria, renal calculus, frequency, or incontinence. ENDOCRINE: No polyuria, nocturia, thyroid problems, heat or cold intolerance. HEMATOLOGY: No anemia, easy bruising or bleeding. SKIN: No acne. Positive for the ulcer that I mentioned on the left forearm. MUSCULOSKELETAL: Positive for low back pain, neck pain, shoulder pain, and has arthritis. NEUROLOGIC: No numbness, weakness, CVA, TIA, or seizures. PSYCHOLOGICAL: Positive for anxiety. No insomnia or depression.   PHYSICAL EXAMINATION:   VITAL SIGNS: Temperature 97.9 degrees Fahrenheit, pulse 93, respirations 14, blood pressure 113/78, pulse oximetry 96% on room air.   GENERAL: Well built, well nourished male lying in bed not in any acute distress.   HEENT: Normocephalic, atraumatic. Pupils equal, round, reacting to light. Anicteric sclerae. Extraocular movements intact. Oropharynx clear without erythema, mass, or exudates.   NECK: Supple. No thyromegaly, JVD, or carotid bruits. No lymphadenopathy.   LUNGS: Moving air bilaterally. No wheeze or crackles. No use of accessory muscles for breathing.   CARDIOVASCULAR: S1, S2 regular rate and rhythm. No murmurs, rubs, or gallops.   ABDOMEN: Soft, nontender, nondistended. No hepatosplenomegaly. Normal bowel sounds.   EXTREMITIES: Has 3+ pitting edema which is chronic. No open ulcers seen.   NEUROLOGIC: Cranial nerves intact. No focal motor or sensory deficits.   SKIN: The patient has a half centimeter diameter punched hole in the left forearm which is healing with surrounding mild erythema and also tenderness on examination.   NEUROLOGIC: Cranial nerves intact. No focal motor or sensory deficits.   PSYCHOLOGICAL: The patient is awake, alert, oriented x3.   LABORATORY DATA: WBC 6.5, hemoglobin 14.9, hematocrit 43.9, platelet count 229, sodium 135, potassium 3.7, chloride 98, bicarb 35, BUN 12, creatinine 1.13, glucose 246, calcium 8.4, ALT 28, AST 28,  alkaline phosphatase 119, total bilirubin 0.3, albumin 3.5. Troponin less than 0.02.   Chest x-ray is pending at this time.   EKG is showing normal sinus rhythm, heart rate of 88, incomplete right bundle branch block. No acute ST-T wave abnormalities.   ASSESSMENT AND PLAN: This is a 59 year old male with history of chronic pain syndrome, CAD status post stents, hypertension, smoking, and COPD who was brought in for chest pain and also worsening left forearm pain and swelling after a tick bite.  1. Chest pain, could be angina versus esophageal spasms. We will also get a D-dimer. Will admit under Obs. Get Myoview in a.m. Dr. Mariah Milling is his regular cardiologist. Will check cardiac enzymes. Continue medications.  2. Tick bite on left forearm, partially taken out and feels like the other half is impacted inside. Was on Keflex for four days. Very concerned as had MRSA infection in the past. Will get imaging to rule out underlying soft tissue infection. Start doxycycline.  3. Chronic pain syndrome, on oxycodone. The patient is requesting Dilaudid for pain while in the hospital. Continue oxycodone for now and morphine p.r.n.  4. Coronary artery disease. Continue home medications.  5. Tobacco use disorder. The patient was counseled for about three  minutes to stop smoking. Placed on nicotine patch while in the hospital.  6. Bilateral lower extremity edema probably from venous insufficiency. Echo with normal EF per Dr. Mariah Milling as described by patient. Is on Torsemide. Will continue and also get Doppler of the lower extremities. He is following with Dr. Gilda Crease for his venous insufficiency as an outpatient.  7. GI and DVT prophylaxis. Nexium and sub-Q heparin.   CODE STATUS: FULL CODE.   TIME SPENT ON ADMISSION: 50 minutes.   ____________________________ Enid Baas, MD rk:drc D: 08/23/2011 18:55:31 ET T: 08/24/2011 05:52:26 ET JOB#: [id number]  cc: Enid Baas, MD, <Dictator> Meindert A.  Lacie Scotts, MD Antonieta Iba, MD Enid Baas MD ELECTRONICALLY SIGNED 08/27/2011 8:51

## 2014-05-01 NOTE — H&P (Signed)
PATIENT NAME:  Gregory Hall, Gregory Hall MR#:  [id number] DATE OF BIRTH:  Nov 23, 1955  DATE OF ADMISSION:  06/24/2012  PRIMARY CARE PHYSICIAN:  Dr. Evelene Croon.  REFERRING MD: Dr. Arnoldo Morale.   CHIEF COMPLAINT: Abdominal pain.   HISTORY OF PRESENT ILLNESS: The patient is a 59 year old male Caucasian male with a past medical history of esophageal spasms, hiatal hernia, coronary artery disease and multiple other medical problems, presenting to the ER with a chief complaint of epigastric abdominal pain radiating to the rest of the abdomen, associated with chills. Denies any fever. He has been experiencing this pain for the past few months intermittently. He is reporting that every month he gets this pain in the epigastrium at least 2 to 3 times. It was not investigated in the past as reported by the patient, and he thinks it needed to be investigated in the future. He also reported to the ER physician that he never had any CAT scan of the abdomen and pelvis done. The patient was hypoxemic and satting 88% on room air. On 2 liters of oxygen he is satting up to 95%. He has a chronic history of COPD, but does not leave on oxygen at home. During ER physician's examination the patient was moving air, but not wheezing, but during my examination patient was wheezing, but little air entry. CAT scan of the abdomen and pelvis has revealed possible airspace disease with some atelectasis versus a possible infiltrate. The patient has received IV Zosyn for possible developing pneumonia. He denies any chest pain. While resting he is quite comfortable with 2 liters of oxygen. Denies any dizziness or loss of consciousness.   PAST MEDICAL HISTORY: Chronic history of COPD, not oxygen-dependent; coronary artery disease, hypertension, hyperlipidemia, spinal stenosis, anxiety, depression, chronic narcotic use and benzodiazepine use, GERD, esophageal spasms, hiatal hernia, diabetes mellitus.   PAST SURGICAL HISTORY: Cardiac stent  placement, lumbar spine surgery, multiple surgeries for the foot and leg, cholecystectomy, tonsillectomy.   THE PATIENT IS ALLERGIC TO FENTANYL.   PSYCHOSOCIAL HISTORY: Resides in [facility] assisted [facility]. He used to smoke, but quit smoking suddenly since previous admission. Denies any alcohol or illicit drug usage.   FAMILY HISTORY: Mother had heart disease. Father had dementia.   REVIEW OF SYSTEMS:  CONSTITUTIONAL: Denies any fever, but complaining of hurting and weakness. Denies any weight loss or gain.  EYES: Denies any blurry vision, glaucoma, cataracts.  ENT: Denies any epistaxis, postnasal drip, difficulty in swallowing.  RESPIRATORY: Denies any cough. Has chronic history of COPD, wheezing during my examination. Denies any hemoptysis.  CARDIOVASCULAR: No chest pain, palpitations, syncope.  GASTROINTESTINAL: Denies nausea, vomiting, diarrhea. Complaining of epigastric abdominal pain radiating to the rest of the abdomen. Has a chronic history of hiatal hernia and esophageal spasms. GENITOURINARY: Denies any prostatitis, hernias.  ENDOCRINE: Denies polyuria, nocturia or thyroid problems.  HEMATOLIGIC: The patient denies anemia, easy bruising.  INTEGUMENTARY: No acne, rash, lesions.  MUSCULOSKELETAL: No joint pain in the neck, back, and shoulder. Complaining of pain in the lower extremities, in his knees and hips. Denies any gout.  NEUROLOGIC: Denies any vertigo, ataxia, weakness, dementia, dysarthria.  PSYCHIATRIC: No known history of insomnia, ADD, OCD, history of bipolar disorder.   PHYSICAL EXAMINATION: VITAL SIGNS: Temperature 98.1, pulse 72 to 95, respirations 18 to 20, blood pressure 110/60. Pulse ox is 93% on 2 liters.  GENERAL APPEARANCE: Not in acute distress. Moderately-built and moderately-nourished.  HEENT: Normocephalic, atraumatic. Pupils are equally reactive to light and accommodation. No scleral icterus. No  conjunctival injection. No sinus tenderness. No postnasal  drip. No pharyngeal exudates.  NECK: Supple. No JVD. No thyromegaly. No lymphadenopathy.  LUNGS: Minimal air entry is present, with diffuse wheezing but no accessory muscle usage. No anterior chest wall tenderness on palpation.  CARDIOVASCULAR: S1, S2 normal. Regular rate and rhythm. No murmurs. No gallop.  GASTROINTESTINAL: Soft. Bowel sounds are positive in all 4 quadrants. Epigastric tenderness is present and generalized tenderness is present, with no rebound tenderness.  NEUROLOGIC: The patient just received Dilaudid, a little lethargic, but answering all questions appropriately. Alert and oriented x 3. Cranial nerves II-XII are intact. Motor and sensory are intact. Reflexes are 2+.   EXTREMITIES: 1+ pitting edema is present. No cyanosis. No clubbing.  SKIN: With no rashes. No lesions. Normal turgor. Warm to touch.  MUSCULOSKELETAL: No joint effusion, tenderness or erythema.  INTEGUMENTARY: No acne, rash, lesions.   LABS AND IMAGING STUDIES: Glucose 190. BUN 14, creatinine 1.3, sodium 137, potassium 3.9, chloride 100, CO2 34, GFR greater than 60. Anion gap 3. Serum osmolality 279, calcium 8.6, lipase 149. LFTs are within normal range. CPK is 2.9. CK 168, troponin less than 0.02.   WBC 11.8, hemoglobin 15.4, hematocrit 45.7, platelets 198.   Urinalysis: Yellow in color, clear in appearance, nitrite- and leukocytes are negative   ABGs: PH is 7.36, pCO2 59, pO2 of 65, FiO2 21, base excess 6.0.   A 12-lead EKG has revealed a left axis deviation, normal sinus rhythm, incomplete right bundle branch block.   CAT scan of the abdomen and pelvis has revealed no acute abdominal or pelvic pathology,  bibasilar air space disease which may represent atelectasis versus pneumonia.   ASSESSMENT AND PLAN: A 59 year old male presenting to the ER with a chief complaint of epigastric abdominal pain which has been going on for the past few months intermittently and diagnosed with healthcare associated  pneumonia. Will be admitted with following assessment and plan.   1.  Pneumonia, most likely healthcare-associated: I will provide him IV Zosyn and Levaquin.  2.  Acute exacerbation of chronic obstructive pulmonary disease: Solu-Medrol will be provided. DuoNebs q. 6 hours and albuterol nebulizer treatments q.4h. p.r.n.  3.  Epigastric abdominal pain: Probably radiation from the pneumonia, with underlying history of gastroesophageal spasms and hiatal hernia: Will continue his home-dose pain medications and morphine as needed for severe pain.  4.  Coronary artery disease: Stable. Resume his home medication.  5.  Diabetes mellitus: I will start the patient on sliding-scale insulin. We will provide the patient GI prophylaxis and DVT prophylaxis.   HE IS FULL CODE.    Plan of care discussed with the patient. He is aware of the plan.   Total time spent on admission is 50 minutes.    ____________________________ Ramonita Lab, MD ag:dm D: 06/24/2012 07:05:07 ET T: 06/24/2012 07:23:58 ET JOB#: [id number]  cc: Ramonita Lab, MD, <Dictator> Ramonita Lab MD ELECTRONICALLY SIGNED 06/24/2012 23:03

## 2014-05-01 NOTE — H&P (Signed)
DATE OF BIRTH:  1955/11/09  DATE OF ADMISSION:  03/29/2012  PRIMARY CARE PHYSICIAN:  Dr. Lacie Scotts  CHIEF COMPLAINT:  Back pain, weakness.  HISTORY OF PRESENT ILLNESS: A 59 year old Caucasian male with a history of COPD, CAD, hypertension, hyperlipidemia, spinal stenosis, who was sent to ED from assisted living due to back pain and weakness. The patient is disoriented, unable to provide detailed information. According to ED physician and the nurse, the patient was sent from assisted living due to back pain and weakness. The patient was noted to have hypoxia, with O2 saturation at 70s. In addition, patient has low blood pressure in 70s. ABG showed a pO2 of 47. The patient's creatinine increased to 4.13, BUN 51. The patient has been treated with oxygen by nasal cannula and normal saline bolus. Blood pressure is stable, 80s. Actually patient was admitted for pneumonia recently, and was just discharged last week. The patient received Zosyn and vancomycin in ED.   PAST MEDICAL HISTORY: COPD, CAD, hypertension, hyperlipidemia, spinal stenosis,  anxiety, depression, chronic narcotic use and benzodiazepine use, GERD, esophageal spasm.   PAST SURGICAL HISTORY: Cardiac stent placement, lumbar spine surgery, multiple surgeries for foot and leg, cholecystectomy and tonsillectomy.   SOCIAL HISTORY: According to previous documents, patient is a resident of assisted living facility. Smokes about 5 to 6 cigarettes per day. No alcohol abuse.   FAMILY HISTORY:  Mother had heart disease. Father had dementia.   REVIEW OF SYSTEMS:  Unable to obtain due to the patient's mental status.   HOME MEDICATIONS: 1.  Aspirin 81 mg p.o. daily. 2.  Benztropine 1 mg p.o. b.i.d. p.r.n. for tremor.  3.  Cetirizine 10 mg p.o. daily. 4.  Citalopram 40 mg p.o. once a day.  5.  Diazepam 10 mg p.o. t.i.d.  6.  Diclofenac 75 mg p.o. b.i.d.  7. Gabapentin 600 mg p.o. t.i.d.  8. Imdur 30 mg p.o. tablets 0.5 tablets once a day.   9. Lisinopril 5 mg p.o. daily.  10.  Lopressor 25 mg p.o. daily.  11.  Mometasone topical 0.1% topical cream once a day.  12.  Nitrostat 0.4 mg 1 tablet sublingual every 5 minutes p.r.n. for chest pain.  13.  Omeprazole 40 mg p.o. daily.  14.  Zofran 4 mg p.o. b.i.d. p.r.n.  15.  Oxycodone 10 mg p.o. 2 tablets 4 times a day.  16.  Polyethylene glycol 3350 oral powder 17 grams p.o. daily.  17.  Potassium 10 mEq tablets b.i.d.  18.  ProAir HFA 90 mcg inhalations 2 puffs every 6 hours p.r.n. for shortness of breath.  19.  Sarna lotion apply topically to affected area once a day.  20.  Zocor 40 mg p.o. 0.5 tablets once a day at bedtime.  21.  Torsemide 20 mg p.o. b.i.d.  22.  Trazodone 150 mg p.o. at bedtime.  23.  Triamcinolone 0.1% topical cream, apply topically to affected area on legs and back twice a day p.r.n.   VITAL SIGNS: Temperature 99.5, blood pressure 71/46, pulse 82.   PHYSICAL EXAMINATION:  GENERAL: The patient is awake but confused, in no acute distress.  HEENT:  Pupils round, equal, react to light. No discharge from ear or nose. Moist oral mucosa. Clear pharynx.  NECK:  Supple. No JVD or carotid bruits. No lymphadenopathy. No thyromegaly.  CARDIOVASCULAR: S1, S2. Regular rate and rhythm. No murmurs or gallops.  PULMONARY:  Bilateral air entry. No wheezing or rales. No use of accessory muscles to breathe, but has very weak  breath sounds.  ABDOMEN:  Soft. No distention. No tenderness. No organomegaly. Bowel sounds present.  EXTREMITIES: No edema, clubbing or cyanosis. No calf tenderness. Bilateral pedal pulses present.  SKIN:  No rash or jaundice.  NEUROLOGIC: The patient is awake but confused. Follows commands. No focal deficits. DTRs 2+.   LABORATORY DATA:  ABG showed pH of 7.36, pCO2 of 53, pO2 of 43, FiO2 of 21%.  Lactic acid 0.7.   Chest x-ray:  Atelectasis versus pneumonia in the right middle and lower lobe, density in the left lower lobe has largely cleared.     Urinalysis is negative. Glucose 109, BUN 51, creatinine 4.13, sodium 132, potassium 5.6, chloride 98. WBC 10.4, hemoglobin 13.6, platelets 181.   IMPRESSIONS: 1.  Hypotension, possibly due to acute renal failure, dehydration and hypertension medication.  2.  Altered mental status, with possible metabolic encephalopathy due to hypoxia, hypotension, and acute renal failure.  3.  Acute renal failure.  4.  Hyperkalemia.  5.  Hyponatremia.  6.  Acute respiratory failure with hypoxia.  7.  Questionable residual pneumonia.  8.  Chronic obstructive pulmonary disease.  9.  Hyperlipidemia.  10.  Spinal stenosis.  11.  Chronic narcotic use.  12.  Tobacco abuse.   PLAN OF TREATMENT: 1.  The patient is admitted to CCU. We will continue O2 by nasal cannula, BiPAP p.r.n. In addition, will give nebulizer treatments.   2.  For hypotension, patient was treated with a normal saline bolus, about 2 liters n the ED. The patient received a third bag of normal saline bolus. If the patient's blood pressure is stable, we will change to normal saline 150 mL per hour. If his blood pressure is not stable, we will start Levophed drip.   3.  For acute renal failure and dehydration, we will continue normal saline IV fluid support. Hold hypertension medication, NSAIDs and narcotics. Will follow up BMP.   4.  For questionable residual pneumonia, we will continue Zosyn and vancomycin, and follow up blood culture and CBC.   5.  For hyperkalemia, patient was given Kayexalate. Follow up potassium level.   6.  GI and DVT prophylaxis.   7.  PT evaluation.  8.  Nicotine patch.   I discussed patient's critical condition and plan of treatment with the ED physician and nurse.   Time spent with critical care:   65 minutes.    ____________________________ Shaune Pollack, MD qc:mr D: 03/29/2012 18:29:21 ET T: 03/29/2012 19:46:24 ET JOB#: [id number]  cc: Shaune Pollack, MD, <Dictator> Shaune Pollack MD ELECTRONICALLY SIGNED  03/30/2012 18:12

## 2014-05-01 NOTE — Discharge Summary (Signed)
DATE OF BIRTH:  06/24/1955  DATE OF ADMISSION:  03/21/2012  DATE OF DISCHARGE:  03/22/2012  For a detailed note, please see the History and Physical done on admission by Dr. Rudene Re.  DIAGNOSES AT DISCHARGE ARE AS FOLLOWS:  Altered mental status, likely secondary to CO2 narcosis, metabolic encephalopathy secondary to CO2 narcosis, chronic pain syndrome,  chronic obstructive pulmonary disease, with ongoing tobacco abuse, hypertension, depression.   The patient is being discharged on a low sodium, low fat diet. Activity is as tolerated. Followup with Dr. Lacie Scotts in the next 1 to 2 weeks.   DISCHARGE MEDICATIONS ARE AS FOLLOWS:  Aspirin 81 mg daily, gabapentin 600 mg t.i.d., trazodone 150 mg at bedtime, triamcinolone cream to be applied to the legs and back twice daily as needed, benztropine 1 mg b.i.d. for tremors, sublingual nitroglycerin as needed, lisinopril 5 mg daily, metoprolol tartrate 25 mg daily, albuterol inhaler 2 puffs q. 6 hours as needed, Celexa 40 mg daily, Prilosec 40 mg daily, MiraLAX daily, Zyrtec 10 mg daily, Imdur 15 mg daily, Zocor 20 mg daily, Valium 10 mg t.i.d., Zofran 4 mg 1 to 2 tabs b.i.d. as needed, oxycodone 20 mg q.i.d. as needed, Levaquin 750 mg daily x 5 days.   PERTINENT STUDIES DONE DURING THE HOSPITAL COURSE ARE AS FOLLOWS:  A chest x-ray done on admission showing bibasilar atelectasis.   BRIEF HOSPITAL COURSE: This is a 59 year old male who presented to the hospital on 03/21/2012 secondary to altered mental status and lethargy. Noted to be in acute respiratory failure.   1.  Altered mental status/lethargy. The most likely cause of this was probably CO2 narcosis. The patient presented to the hospital, and was noted to have an arterial blood gas with CO2 as high as 65, with a pH of 7.29. The patient has a history of underlying COPD, plus was taking high-dose narcotics. The patient was given some Narcan and also put on BiPAP. The patient's mental status since then  has significantly improved, and currently back down to baseline.    2.  Metabolic encephalopathy. This was secondary to the CO2 narcosis, as the patient was noted to be acidotic, with an elevated CO2 on ABG. After getting some Narcan to reverse his high- dose narcotics and also on BiPAP, his encephalopathy since then has improved and now resolved.   3. Chronic pain syndrome. Due to his CO2 narcosis, patient's pain medications were held. Although since his mental status has improved, I resumed his oxycodone. I did tell him that he needs to discuss with his pain management doctor about reducing the dosage of his oxycodone, as it may be affecting his COPD.    4.  Chronic obstructive pulmonary disease, with acute on chronic respiratory failure. The patient was started empirically on some antibiotics for suspected pneumonia, based on chest x-ray findings. Although he has remained afebrile and his cultures have been negative, I am empirically discharging  him on a few more days of p.o. Levaquin. He will continue his inhalers as stated.   5. Hypertension. The patient remained hemodynamically stable. He will continue his Imdur and metoprolol as stated.   6.  Hyperlipidemia. The patient was maintained on his simvastatin, and he will resume that upon discharge, too.   7.  The patient is a FULL CODE.  He is being discharged back to his assisted living facility.   Time spent is 40 minutes.    ____________________________ Rolly Pancake. Cherlynn Kaiser, MD vjs:mr D: 03/22/2012 16:14:00 ET T: 03/23/2012 12:50:14 ET JOB#:  [id number]  cc: Rolly Pancake. Cherlynn Kaiser, MD, <Dictator> Meindert A. Lacie Scotts, MD  Houston Siren MD ELECTRONICALLY SIGNED 03/25/2012 8:16

## 2014-05-01 NOTE — Consult Note (Signed)
PATIENT NAME:  Gregory Hall, Gregory Hall MR#:  [id number] DATE OF BIRTH:  [birth year]  DATE OF CONSULTATION:  03/30/2012  REFERRING PHYSICIAN:  Dr. Shaune Pollack.  CONSULTING PHYSICIAN:  Ambera Fedele Lizabeth Leyden, MD  REASON FOR CONSULTATION:  Acute renal failure.   HISTORY OF PRESENT ILLNESS:  The patient is a 59 year old Caucasian male with past medical history of hypertension, hyperlipidemia, coronary artery disease, spinal stenosis, depression, chronic narcotic use, GERD, esophageal spasm, COPD, who presented to [facility] with generalized weakness and dizziness. He reports to me that he was found to have low blood pressure at his assisted living facility. He had altered mentation upon presentation; however, this appears to be improved at present. Upon arrival in the Emergency Department, systolic blood pressure was in the 70s. He also had hypoxemia. In addition, the patient was found to have acute renal failure with a creatinine of 4.13 and BUN of 51. The patient's baseline creatinine appears to be 1.22 from 03/22/2012. In addition, he was noted to be on several medicines that could be considered nephrotoxins in the setting of hypotension. The patient was on diclofenac, lisinopril, and torsemide. The patient was started on IV fluid hydration and has had some improvement in his renal function. Creatinine today is down to 1.64 and BUN is down to 29. He also had a renal ultrasound performed yesterday which showed a right kidney of 12.4 cm and the left kidney of 13.7 cm. There was no hydronephrosis noted.   PAST MEDICAL HISTORY: 1.  Hypertension.  2.  Hyperlipidemia.  3.  COPD.  4.  Coronary artery disease.  5.  Depression/anxiety.  6.  Chronic narcotic use.  7.  GERD.  8   Esophageal spasm.  9.  Spinal stenosis.   PAST SURGICAL HISTORY: 1.  A history of coronary disease with cardiac stent placement.  2.  Multiple lumbar spinal surgeries.  3.  Right foot surgery.  4.  Cholecystectomy.  5.   Tonsillectomy.   ALLERGIES:  INCLUDE FENTANYL.   MEDICATIONS INCLUDE:  Tylenol 650 mg p.o. every 4 hours p.r.n., albuterol 2 puffs inhaled q.4 hours while awake, aspirin 81 mg p.o. daily, benztropine 1 mg p.o. b.i.d.,  daily, citalopram 40 mg p.o. daily, Neurontin 600 mg p.o. t.i.d., heparin 5000 units subcutaneous every 12 hours, Zofran 4 mg IV every 4 hours p.r.n., pantoprazole 40 mg p.o. every 6:00 a.m., MiraLAX 17 grams p.o. daily p.r.n. constipation, Zocor 20 mg p.o. at bedtime, trazodone 150 mg p.o. at bedtime, isosorbide mononitrate 30 mg p.o. daily, metoprolol 25 mg p.o. b.i.d., nicotine patch 14 mg transdermal daily, oxycodone 10 mg p.o. q.4 to 6 hours p.r.n. pain, Zosyn 3.375 grams IV every 8 hours, tiotropium 1 capsule inhaled daily.  SOCIAL HISTORY:  The patient resides at [facility]. He states that he smokes at least 1 pack of cigarettes per day. He states he has not drank alcohol in 30 years. It appears he has history of narcotic dependency.   FAMILY HISTORY:  Mother is deceased from coronary disease. The patient states that his father is alive but has early dementia.  REVIEW OF SYSTEMS: CONSTITUTIONAL:  Denies fevers, chills, weight loss.  EYES:  Denies diplopia.  HEENT:  Denies headaches, hearing loss, tinnitus.  CARDIOVASCULAR:  Denies chest pain, palpitations.  RESPIRATORY:  Endorses cough and intermittent shortness of breath, has underlying COPD.  GASTROINTESTINAL:  Currently denies nausea, vomiting, dysphagia.  GENITOURINARY:  Denies frequency, urgency, dysuria.  MUSCULOSKELETAL:  Has chronic low back pain.  INTEGUMENTARY:  Denies skin rashes or lesions.  NEUROLOGIC:  Currently denies focal weakness or numbness though he does have some generalized weakness.  PSYCHIATRIC:  Has history of depression and anxiety.  ENDOCRINE:  Denies polyuria or polydipsia.  HEMATOLOGIC AND LYMPHATIC:  Denies easy bruisability, bleeding, or swollen lymph nodes.   ALLERGY AND IMMUNOLOGIC:  Denies seasonal allergies or history of immunodeficiency.   PHYSICAL EXAMINATION:  VITAL SIGNS:  Temperature 99.1, pulse 83, respirations 14, blood pressure 126/54, pulse ox 91% on 3 liters.  GENERAL:  A well-developed, well-nourished Caucasian male who appears older than his stated age, currently in no acute distress.  HEENT:  Normocephalic, atraumatic. Extraocular movements are intact. Pupils equal, round, and reactive to light. No scleral icterus. Conjunctivae are pink. No epistaxis noted. Gross hearing intact. Oral mucosa are dry.  NECK:  Supple without JVD or lymphadenopathy.  LUNGS:  Show a few scattered wheezes, otherwise normal respiratory effort.  HEART:  S1 and S2, regular rate and rhythm. No murmurs, rubs, or gallops appreciated.  ABDOMEN:  Soft, nontender, nondistended. Bowel sounds positive. No rebound or guarding. No gross organomegaly appreciated.  EXTREMITIES:  No clubbing, cyanosis, or edema.  NEUROLOGIC:  The patient is alert and oriented to time, person, and place. Strength is 5/5 in both upper and lower extremities. Gross sensation is intact.  MUSCULOSKELETAL:  No joint redness, swelling or tenderness appreciated.  SKIN:  Warm and dry. No rashes noted.  GENITOURINARY:  No suprapubic tenderness is noted at this time.  PSYCHIATRIC:  The patient has an appropriate affect and appears to have some insight into his current illness.   LABORATORY DATA:  CBC shows WBC 6.3, hemoglobin 12.7, hematocrit 36, platelets 160. CMP shows sodium 142, potassium 4.5, chloride 108, CO2 31, BUN 29, creatinine 1.6, glucose 134, magnesium 2.1. Renal ultrasound was normal. ABG showed pH of 7.36, pCO2 53, pO2 43, FiO2 21%. Lactic acid level was 0.7. Chest x-ray showed atelectasis versus pneumonia in the right middle lobe and lower lobes. Urinalysis was negative for protein, less than 1 RBC per high-power field, no WBCs noted. Blood cultures x 2 sets are negative.   IMPRESSION:   This is a 59 year old Caucasian male with the past medical history of hypertension, hyperlipidemia, spinal stenosis, chronic obstructive pulmonary disease, coronary artery disease, anxiety, depression, chronic narcotic and benzodiazepine dependence, gastroesophageal reflux disease, and esophageal spasm who presented to [facility] with weakness, altered mental status and hypotension. 1.  Acute renal failure. The etiology of the patient's underlying acute renal failure is likely acute tubular necrosis. The patient had hypotension and was also on lisinopril, torsemide, and diclofenac. These medications have been held. A renal ultrasound was negative for hydronephrosis. We agree with continued IV fluid hydration with 0.9 normal saline at 100 mL per hour. Would certainly avoid any further nephrotoxins and would avoid contrast at this time.  2.  Hypotension. Blood pressure was documented as low as being 78/42 in the Emergency Department. With hydration and holding several his antihypertensives, the blood pressure has risen now. Continue to monitor blood pressure closely and avoid hypotension in the recovery phase of acute tubular necrosis.  3.  Hyperkalemia: Serum potassium was high yesterday upon presentation at 5.6. With hydration, this has improved. Today's potassium is down to 4.5. We recommend continued monitoring of serum potassium levels. 4.  Altered mental status. Likely multifactorial with contributions from medications and concurrent illness. His mentation at present appears to be much improved. The patient will likely need  to be monitored for withdrawal.   ____________________________ Lennox Pippins, MD mnl:jm D: 03/30/2012 10:05:27 ET T: 03/30/2012 10:43:04 ET JOB#: [id number]  cc: Lennox Pippins, MD, <Dictator> Ria Comment Zanai Mallari MD ELECTRONICALLY SIGNED 04/12/2012 11:33

## 2014-05-01 NOTE — Discharge Summary (Signed)
PATIENT NAME:  Gregory Hall, TUCCILLO MR#:  [id number] DATE OF BIRTH:  1955/10/19  DATE OF ADMISSION:  03/29/2012 DATE OF DISCHARGE:  03/31/2012  PRIMARY CARE PHYSICIAN:  Dr. Lacie Scotts.   CONSULTATIONS: Nephrology, Dr. Cherylann Ratel.   DISCHARGE DIAGNOSES:  Altered mental status, metabolic encephalopathy, hypotension,  acute renal failure, hyperkalemia, acute respiratory failure, hypoxia, chronic obstructive pulmonary disease, spinal stenosis with back pain, chronic narcotic and alcohol abuse, hyperlipidemia   CODE STATUS: FULL CODE.   CONDITION: Stable.   HOME MEDICATIONS: Please refer to the [facility] physician discharge instructions, medication reconciliation list.   DIET: Low-sodium, low-fat, low-cholesterol diet.   ACTIVITY: As tolerated.   FOLLOWUP CARE: Follow up with PCP within 1 week. The patient also needs smoking cessation. The patient needs to avoid NSAIDs, narcotics. Hold hypertension medication for now. Monitor blood pressure. The patient may resume some hypertension medications depending on patient's blood pressure. The patient needs to follow up with PCP to adjust the hypertension medication dose.   REASON FOR ADMISSION: Back pain, weakness.   HOSPITAL COURSE: The patient is a 58 year old Caucasian male with a history of COPD, CAD, hypertension, hyperlipidemia, spinal stenosis with chronic back pain. The patient was sent from assisted living due to back pain, weakness, and disorientation. The patient was noted to have a low blood pressure in the 70s, and also saturation was at 70s. BUN was 51, creatinine 4.13. ABG showed PO2 of 47. Patient actually was just discharged from our hospital 10 days ago for pneumonia. The patient received normal saline bolus in the ED. For a detailed history and physical examination, please refer to the admission note dictated by me. On the admission date, the patient's urinalysis was negative, WBC 10.4, hemoglobin 13.6. Chest x-ray showed atelectasis versus pneumonia  in the right medial and lower lobe. The density in the left lower lobe has largely cleared. The patient was admitted for hypotension and acute respiratory failure. The patient was admitted to the CCU. After admission, the patient's hypertension medication was on hold. NSAID was on hold. The patient has been treated with IV fluid support as hypotension has improved. The patient's renal function has been improving. BUN and creatinine decreased to a normal range. Today, the patient is alert, awake, oriented. He has no complaints. The patient's Zosyn was discontinued due to no evidence of active pneumonia.   For hyperkalemia, the patient was treated Kayexalate and IV fluid support. Potassium is normal. For tobacco abuse, the patient was counseled for smoking cessation and was treated with a nicotine patch. The patient is clinically stable and will be discharged to assisted living today. I discussed the patient's discharge plan with the patient, case manager and nurse.   TIME SPENT: About 37 minutes.     ____________________________ Shaune Pollack, MD qc:dm D: 03/31/2012 13:38:00 ET T: 03/31/2012 14:58:38 ET JOB#: [id number]  cc: Shaune Pollack, MD, <Dictator> Shaune Pollack MD ELECTRONICALLY SIGNED 04/03/2012 18:15

## 2014-05-01 NOTE — H&P (Signed)
PATIENT NAME:  Gregory Hall, Gregory Hall MR#:  [id number] DATE OF BIRTH:  12/09/1955  DATE OF ADMISSION:  03/21/2012  PRIMARY CARE PHYSICIAN:  Dr. Lacie Scotts.   REFERRING PHYSICIAN:  Dr. Si Raider.   CHIEF COMPLAINT:  Decreased responsiveness and hypoxia.   HISTORY OF PRESENT ILLNESS:  The patient is a 59 year old Caucasian male with history of chronic smoking, chronic pain syndrome on chronic use of narcotics and benzodiazepines.  He is also on multiple antidepressants.  The patient indicates that he has some cough and sputum production for the last few days and increasing shortness of breath.  He became more drowsy and lethargic.  His friend or roommate called EMS and patient was transported here.  He was barely responsive at time of arrival.  He received intravenous Narcan and then he was placed on BiPAP treatment after which patient became arousable and communicating, although he remained slightly encephalopathic and does not give details or detailed history.  His arterial blood gas showed evidence of acute respiratory failure and also his blood work-up revealed rhabdomyolysis and evidence of acute renal failure.  The patient was admitted to the intensive care unit for further evaluation and treatment.   REVIEW OF SYSTEMS:  A 10 point system review is unobtainable due to patient's obtundation and encephalopathy even though he is now much better than when he arrived earlier, but remains sleepy.  He will not answer many of the questions, although he was able to recognize his name when I misspelled his name.  Again, he will answer a few of the questions, however most of the time he will stare on the ceiling and does not give adequate information.   PAST MEDICAL HISTORY:  Chronic smoker, chronic obstructive pulmonary disease, coronary artery disease, status post stent implant x 2 in 2010.  His last Myoview nuclear scan was in August 2013 showing ejection fraction of 55% and no evidence of reversible ischemia.   Systemic hypertension, hyperlipidemia, spinal stenosis status post lumbar surgery, anxiety, depression, chronic narcotic use and benzodiazepine use, gastroesophageal reflux disease, esophageal spasm.   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  Cholecystectomy and tonsillectomy.   FAMILY HISTORY:  Both parents were chronic smokers.  Old records indicates that his mother used to smoke more than 5 packs a day and she suffered from emphysema.  His father has dementia.   SOCIAL HABITS:  Chronic smoker, 1 pack per day.  He does not tell me what age he started smoking.  Denies alcohol abuse.   SOCIAL HISTORY:  He is divorced.  He apparently is living with assisted living facility with somebody else, but he does not give me any details.   ADMISSION MEDICATIONS:  Aspirin 81 mg a day, benztropine 1 mg twice a day as needed for tremors.  Citalopram 40 mg a day, gabapentin 600 mg 3 times a day, Imdur 30 mg 1/2 tablet once a day, lisinopril 5 mg a day, metoprolol 25 mg once a day, MiraLAX 17 grams once a day, Nitrostat 0.4 mg q. 5 minutes as needed, oxycodone 10 mg taking 2 tablets 4 times a day.  Prilosec 40 mg a day, ProAir HFA 2 inhalations q. 6 hours as needed, trazodone 150 mg at bedtime.  Triamcinolone 0.1% topical cream.  Valium 10 mg 3 times a day.  Zocor 40 mg 1/2 tablet at bedtime.  Zofran as needed and Zyrtec 10  mg a day.   ALLERGIES:  FENTANYL CAUSING NAUSEA, VOMITING.   PHYSICAL EXAMINATION: VITAL SIGNS:  Blood pressure 148/72, respiratory rate 20, pulse 100, temperature 99, oxygen saturation 95% while on oxygen.  His initial O2 saturation by ABG was 25%.  GENERAL APPEARANCE:  Middle-aged male lying in bed in no acute distress.  He is sleepy, but arousable and appears to be encephalopathic.  HEAD AND NECK:  No pallor.  No icterus.  No cyanosis.  EARS, NOSE, THROAT:  Ear examination  revealed normal hearing.  No discharge.  No lesions.  Nasal mucosa was normal without discharge, no bleeding.  Oropharyngeal area showed that he had lost most of his teeth.  No oral thrush.  No ulcers.  EYE:  Revealed normal eyelids and conjunctivae.  Pupils about 4 to 5 mm, round, equal and reactive to light.  NECK:  Supple.  Trachea at midline.  No thyromegaly.  No cervical lymphadenopathy, no masses.  HEART:  Normal S1, S2.  No S3, S4.  No murmur.  No gallop.  LUNGS:  Normal breathing pattern without use of accessory muscles.  No rales.  No wheezing.  ABDOMEN:  Soft without tenderness.  No hepatosplenomegaly.  No masses.  No hernias.  SKIN:  No ulcers.  No subcutaneous nodules.  MUSCULOSKELETAL:  No joint swelling.  No clubbing.  NEUROLOGIC:  Cranial nerves II through XII are intact.  No focal motor deficit.  The patient is sleepy, lethargic and encephalopathic, arousable.  He may give some of the answers accurately, but at times just stares and does not answer.  PSYCHIATRIC:  Again, patient is sleepy, drowsy and encephalopathic.   LABORATORY FINDINGS AND RADIOLOGIC DATA:  His chest x-ray showed bilateral lower lobe infiltrate versus atelectasis.  EKG showed normal sinus rhythm at rate of 100 per minute, nonspecific ST-T wave abnormalities in the lateral leads.  ABG showed a pH of 7.29, pCO2 65, pO2 was not reported.  His O2 saturation was 25% and that was on FiO2 of 36%.  Serum acetaminophen level was less than 2 and salicylate was 9.7.  Urinalysis was unremarkable.  Prothrombin time 13.  INR 1.  D-dimer was 0.8.  CBC showed white count of 10,000, hemoglobin 14, hematocrit 45, platelet count 199.  Urine drug screen was positive for benzodiazepines and opiates.  Total CPK was 847 with a troponin less than 0.02.  Normal liver function tests and liver transaminases.  Serum glucose 132.  B-type natriuretic peptide was 1523.  BUN 55, creatinine 2.8.  His baseline BUN was 10 with a creatinine of 1.2 in  September 2013.  Sodium 135, potassium 4.9.   ASSESSMENT: 1.  Acute respiratory failure and hypoventilation, likely from narcotics and benzodiazepines overdose.  2.  Bibasilar pulmonary infiltrates likely secondary to pneumonia.  3.  Encephalopathy and CO2 narcosis.  4.  Acute renal failure.  5.  Rhabdomyolysis.  6.  Chronic pain syndrome.  7.  Coronary artery disease.  8.  Chronic obstructive pulmonary disease.   9.  Depression.  10.  Systemic hypertension and hyperlipidemia.   PLAN:  We will admit the patient to the intensive care unit.  I will repeat the arterial blood gas in 30 minutes.  Continue treatment with DuoNebs.  Oxygen supplementation.  IV antibiotic using Levaquin.  IV fluids and follow up on the kidney function.  Hold oxycodone and Valium and all sedation including the Zyrtec and trazodone.  I will also hold lisinopril since he has acute renal failure.  For deep  vein thrombosis prophylaxis, I will place him on heparin subcutaneously.   TIME SPENT IN EVALUATING THIS PATIENT:  Took more than 1-1/2 hours including more than an hour on intensive care and critical care management.  Also, I reviewed all his medical records.     ____________________________ Carney Corners. Rudene Re, MD amd:ea D: 03/21/2012 01:05:04 ET T: 03/21/2012 01:56:25 ET JOB#: [id number]  cc: Carney Corners. Rudene Re, MD, <Dictator> Zollie Scale MD ELECTRONICALLY SIGNED 03/22/2012 6:17

## 2014-05-01 NOTE — Op Note (Signed)
PATIENT NAME:  Gregory Hall, Gregory Hall MR#:  [id number] DATE OF BIRTH:  10/22/1955  DATE OF PROCEDURE:  01/16/2012  CONTINUATION:  This patient had a colonoscopy performed in the past, about two years ago and had multiple polyps, for which I did a polypectomy and polyps were removed. The patient is on chronic pain medications also and he said he took the prep very well today.  During the colonoscopy he was very dirty and he was not clean all the way down to the cecum.  The small, as well as large polyps were not able to be seen because of the stool.  I was able to see one polyp in the sigmoid colon, which is removed with biopsy forceps. He was found to have multiple diverticulosis of colon. I think he will need another prep taken, so I am going to ask him if we can keep him on clear liquids for two days and then take a GoLYTELY prep so that he is clean before we can reattempt the colonoscopy.  ____________________________ Alton Revere. Cecelia Byars, MD msh:eg D: 01/16/2012 15:12:37 ET T: 01/16/2012 20:36:31 ET JOB#: [id number]  cc: Zakyra Kukuk S. Cecelia Byars, MD, <Dictator> Meindert A. Lacie Scotts, MD Meryle Ready MD ELECTRONICALLY SIGNED 01/23/2012 16:26

## 2014-05-01 NOTE — Discharge Summary (Signed)
PATIENT NAME:  Gregory Hall, Gregory Hall MR#:  [id number] DATE OF BIRTH:  [birth year]  DATE OF ADMISSION:  06/24/2012 DATE OF DISCHARGE:  06/25/2012  ADDENDUM  Just prior to discharge from the hospital, the patient was tested for oxygen need at home and he was noted to have oxygenation around 91% on room air on exertion. His oxygenation briefly went down as low as 86 just for a second or two when he sat down; however, it bounced back very quickly so we did not feel at this point that he needs any oxygen at home. However, it is recommended to follow his oxygenation outside from the hospital and make decisions about home oxygen management if needed. He is being discharged without oxygen. It is recommended also to follow the patient's chest x-ray as outpatient to insure that his pneumonia/atelectasis is resolving with current therapy.    ____________________________ Katharina Caper, MD rv:si D: 06/25/2012 16:16:14 ET T: 06/25/2012 20:13:04 ET JOB#: [id number]  cc: Katharina Caper, MD, <Dictator> Meindert A. Lacie Scotts, MD  Katharina Caper MD ELECTRONICALLY SIGNED 07/15/2012 12:32

## 2014-05-01 NOTE — Discharge Summary (Signed)
PATIENT NAME:  Gregory Hall, Gregory Hall MR#:  [id number] DATE OF BIRTH:  1955/12/22  DATE OF ADMISSION:  06/24/2012 DATE OF DISCHARGE:  06/25/2012  ADMITTING DIAGNOSIS: Pneumonia.   DISCHARGE DIAGNOSES: 1.  Epigastric abdominal pain, likely hiatal hernia-related, also constipation.  2.  Bacterial pneumonia, questionable aspiration.  3.  Chronic obstructive pulmonary disease exacerbation due to pneumonia.  4.  Diabetes mellitus, type 2. 5.  Severe constipation, opioid-related.  6.  History of coronary artery disease.  7.  Hypertension.  8.  Hyperlipidemia.  9.  Spinal stenosis.  10.  Opioid dependent.  11.  Anxiety, depression.  12.  Gastroesophageal reflux disease.  13.  Chronic obstructive pulmonary disease.   The patient was advised to elevate his head of bed by at least 30 degrees at night to prevent possible aspiration from the stomach.   DISCHARGE MEDICATIONS: As follows:  1.  Zocor 40 mg p.o. at bedtime. 2.  Advair Diskus 250/50, 1 puff twice daily.  3.  Metformin 500 mg p.o. twice daily. 4.  Demadex 20 mg p.o. once daily.  5.  Potassium chloride 10 mEq p.o. twice daily.  6.  Oxycodone 30 mg p.o. every 6 hours. 7.  Gabapentin 600 mg 3 times daily.  8.  Diazepam 10 mg p.o. 3 times daily.  9.  Trazodone 100 mg p.o. once daily in the evening.  10.  Ativan 0.5 mg twice daily as needed.  11.  Zofran 4 mg twice daily.  12.  ProAir HFA 2 puffs 4 times daily as needed.  13.  Lisinopril 5 mg p.o. once daily. 14.  New medication:  Prednisone 10 mg tablet, 50 mg once on 06/26/2012, then taper x 10 mg daily until stopped.  15.  Senna 1 tablet once daily.  16.  Docusate sodium 100 mg p.o. twice daily.  17.  MiraLax 17 grams daily as needed for constipation.  18.  Protonix 40 mg p.o. once daily.  19.  Levaquin 750 mg p.o. daily for 4 more days.  20.  GI cocktail with lidocaine suspension 55 mL every 6 hours as needed.   HOME HEALTH: The patient is being discharged with home health physical  therapy as well as nurse.   DIET: 2 grams salt, low fat, low cholesterol, carbohydrate-controlled diet, regular consistency.   ACTIVITY LIMITATIONS: As tolerated.   FOLLOW-UP APPOINTMENT:  With Dr. Lacie Scotts in 2 days after discharge.   CONSULTANTS: Care Management.   LABORATORY AND RADIOLOGICAL DATA:  Chest x-ray, portable single view 06/23/2012, showed chest radiograph without evidence of acute cardiopulmonary disease.  Abdominal x-ray, 3 views 06/24/2012, showed nonobstructive bowel gas pattern with moderate-to-large  amount of stool.  CT scan of abdomen and pelvis with contrast 06/24/2012 revealed no acute abdominal or pelvic pathology.  There is bibasilar airspace disease which may represent atelectasis versus pneumonia, according to radiologist.   The patient's lab data done on admission, 06/23/2012, revealed elevation of glucose of 190. A CO2 level was high at 34, otherwise BMP was unremarkable. Lipase level was normal at 149. Liver enzymes were normal. Cardiac enzymes, 1 set, was unremarkable. The patient's white blood cell count was mildly elevated at 11.8, hemoglobin was 15.4 and platelet count was 198.   HISTORY AND PHYSICAL: The patient is a 59 year old Caucasian male with past medical history significant for history of opioid use due to spinal stenosis who presents to the hospital with complaints of abdominal pains. Please refer to Dr. Rob Hickman admission note on 06/24/2012.  On arrival to the hospital,  temperature was normal at 98.1, pulse was 70s to 90s, respiratory rate was 18 to 20, blood pressure 110/60. Pulse oximetry was 93% on 2 liters of oxygen through nasal cannula. Physical exam revealed some epigastric tenderness and generalized tenderness, however, no rebound or guarding were noted on abdominal exam. The patient had some diffuse wheezing on lung exam.   HOSPITAL COURSE: The patient was admitted to the hospital for further evaluation and treatment for pneumonia. He was  initiated on broad-spectrum antibiotic therapy.   1.  First, in regards to abdominal pain, the patient was complaining of abdominal pain which was epigastric and very sharp in nature. It was felt it was likely hiatal hernia related; however, the patient was also noted to be constipated and possibly even had pseudo-obstruction. The patient was given medications, and his constipation was relieved with bowel movements.   His pain was also relieved whenever he had bowel movement.  It was felt that the patient should be continued on medications, stool softeners as well as stimulants, while he is on opioid medications for his back pains. That was discussed with him quite extensively, and he voiced agreement and understanding. We also started him on proton pump inhibitor, Protonix, which alleviated his discomfort. The patient was also given a GI cocktail with lidocaine which again was beneficial for his abdominal discomfort. He is to follow up with his primary care physician and make decisions about gastroenterology consultation if his pain is relentless.  As mentioned above, the patient's CT scan was unremarkable.   2.  In regards to pneumonia, it was felt that the patient could have also aspiration pneumonitis. He was advised to elevate his head when he is sleeping, and he is to continue antibiotic therapy with Levaquin for 4 more days to complete course. He also discussed an issue with poor gastric emptying in the setting of severe constipation and risks of aspiration, and he voiced understanding of that.  We were trying to get sputum cultures; however, he was not expectorating much, and since he was improving on Levaquin, we felt that Levaquin would be beneficial for him at this time to finish the course.   3.  The patient was treated with steroids as well as inhalation therapy for his chronic obstructive pulmonary disease exacerbation, which improved.  The patient is to continue inhalation therapy with inhalers  and follow up with his primary care physician, Dr. Lacie Scotts, for further recommendations.   4.  In regards to diabetes mellitus, the patient was continued on his usual medications as well as sliding scale insulin and diabetic diet.   5.  For constipation, the patient was advised to continue Colace as well as Senna and MiraLax. His constipation resolved.  With that, his abdominal pain also resolved.   The patient is being discharged in stable condition with the above-mentioned medications and followup. His vital signs on the day of discharge: Temperature was 98.1, pulse was 80s to 90s, respiratory rate was 18, blood pressure 113/67, saturation was 90% on room air on exertion but intermittently would go down as low as 86% on room air on exertion. It was felt that the patient may benefit from oxygen therapy as well.   ____________________________ Katharina Caper, MD rv:cb D: 06/25/2012 16:09:25 ET T: 06/25/2012 22:47:59 ET JOB#: [id number]  cc: Katharina Caper, MD, <Dictator> Meindert A. Lacie Scotts, MD Katharina Caper MD ELECTRONICALLY SIGNED 07/15/2012 12:32

## 2014-05-02 NOTE — Op Note (Signed)
PATIENT NAME:  Gregory Hall, Gregory Hall MR#:  [id number] DATE OF BIRTH:  [birth year]  DATE OF PROCEDURE:  05/06/2013  PREOPERATIVE DIAGNOSIS: Visually significant cataract of the left eye.   POSTOPERATIVE DIAGNOSIS: Visually significant cataract of the left eye.   OPERATIVE PROCEDURE: Cataract extraction by phacoemulsification with implant of intraocular lens to left eye.   SURGEON: Galen Manila, MD.   ANESTHESIA:  1. Managed anesthesia care.  2. Topical tetracaine drops followed by 2% Xylocaine jelly applied in the preoperative holding area.   COMPLICATIONS: None.   TECHNIQUE:  Stop and chop.  DESCRIPTION OF PROCEDURE: The patient was examined and consented in the preoperative holding area where the aforementioned topical anesthesia was applied to the left eye and then brought back to the Operating Room where the left eye was prepped and draped in the usual sterile ophthalmic fashion and a lid speculum was placed. A paracentesis was created with the side port blade and the anterior chamber was filled with viscoelastic. A near clear corneal incision was performed with the steel keratome. A continuous curvilinear capsulorrhexis was performed with a cystotome followed by the capsulorrhexis forceps. Hydrodissection and hydrodelineation were carried out with BSS on a blunt cannula. The lens was removed in a stop and chop  technique and the remaining cortical material was removed with the irrigation-aspiration handpiece. The capsular bag was inflated with viscoelastic and the Tecnis ZCB00 14.0-diopter lens, serial number [id number] was placed in the capsular bag without complication. The remaining viscoelastic was removed from the eye with the irrigation-aspiration handpiece. The wounds were hydrated. The anterior chamber was flushed with Miostat and the eye was inflated to physiologic pressure. 0.1 mL of cefuroxime concentration 10 mg/mL was placed in the anterior chamber. The wounds were found to be water  tight. The eye was dressed with Vigamox. The patient was given protective glasses to wear throughout the day and a shield with which to sleep tonight. The patient was also given drops with which to begin a drop regimen today and will follow-up with me in one day.     ____________________________ Gregory Field. Ayza Ripoll, MD wlp:dmm D: 05/06/2013 21:22:24 ET T: 05/06/2013 21:53:45 ET JOB#: [id number]  cc: Asal Teas L. Analea Muller, MD, <Dictator> Gregory Field Merrie Epler MD ELECTRONICALLY SIGNED 05/07/2013 14:06

## 2014-05-02 NOTE — H&P (Signed)
PATIENT NAME:  Gregory Hall, Gregory Hall MR#:  [id number] DATE OF BIRTH:  09-Oct-1955  DATE OF ADMISSION:  03/30/2013  REFERRING PHYSICIAN: Dr. Fanny Bien.   FAMILY PHYSICIAN: Dr. Lacie Scotts    CARDIOLOGIST: Dr. Mariah Milling.   REASON FOR ADMISSION: Chest pain.   HISTORY OF PRESENT ILLNESS: The patient is a 59 year old male with a history of multiple medical problems including coronary artery disease, status post multiple MIs and two stent placements. He also has a history of chronic pain, degenerative disk disease, esophageal spasm, and anxiety. Presents to the Emergency Room with a three day history of intermittent chest pain associated with headache. En route, the patient was given nitroglycerin, which caused hypotension. He continued to complain of chest pain in the Emergency Room. Initial cardiac enzymes were negative. EKG did show some nonspecific changes with lateral T wave inversion. D-dimer was elevated. He is now admitted for further evaluation.   PAST MEDICAL HISTORY: 1.  Chronic obstructive pulmonary disease.  2.  Atherosclerotic cardiovascular disease, status post myocardial infarction x3.  3.  Status post percutaneous transluminal coronary angioplasty with stent placement x2.  4.  GE reflux disease.  5.  History of MRSA.  6.  Esophageal spasm.  7.  Hiatal hernia.  8.  Bilateral hearing loss.  9.  Type 2 diabetes. 10.  Benign hypertension.  11.  Degenerative disk disease.  12.  Status post back surgery.  13.  Status post cholecystectomy.    MEDICATIONS: 1.  Vitamin D3, 5000 units p.o. daily.  2.  Trazodone 200 mg p.o. at bedtime.  3.  Demadex 20 mg p.o. b.i.d.  4.  Symbicort 2 puffs b.i.d.  5.  Spiriva 1 capsule inhaled daily.  6.  Zocor 40 mg p.o. daily.  7.  ProAir 2 puffs every four hours p.r.n. shortness of breath.  8.  Protonix 40 mg p.o. b.i.d.  9.  Oxycodone 30 mg p.o. q.i.d.  10.  Zofran 4 mg p.o. b.i.d.  11.  Nitrostat p.r.n. chest pain.  12.  Metformin 500 mg p.o. b.i.d.  13.   Ativan 0.5 mg p.o. q.6h. p.r.n.  14.  Zestril 5 mg p.o. daily.  15.  Valium 10 mg p.o. t.i.d.  16.  Celexa 40 mg p.o. daily.  17.  Aspirin 81 mg p.o. daily.    ALLERGIES: FENTANYL.   SOCIAL HISTORY: The patient does smoke. Denies alcohol abuse.   FAMILY HISTORY: Positive for coronary artery disease, diabetes, stroke, prostate cancer. Negative for colon cancer.   REVIEW OF SYSTEMS:  CONSTITUTIONAL: No fever or change in weight.  EYES: No blurred or double vision. No glaucoma.  ENT: No tinnitus or hearing loss. No nasal discharge or bleeding. No difficulty swallowing.  RESPIRATORY: The patient denies cough or wheezing. No hemoptysis. Does hurt to breathe.  CARDIOVASCULAR: No orthopnea or palpitations. No syncope.  GASTROINTESTINAL: No nausea, vomiting, or diarrhea. No abdominal pain or change in bowel habits.  GENITOURINARY: No dysuria or hematuria. No incontinence.  ENDOCRINE: No polyuria or polydipsia. No heat or cold intolerance.  HEMATOLOGIC: The patient denies anemia, easy bruising or bleeding.  LYMPHATIC: No swollen glands.  MUSCULOSKELETAL: The patient denies pain in his neck, shoulders, knees, or hips. No gout.  NEUROLOGIC: No numbness or migraines. Denies stroke or seizures.  PSYCHIATRIC: The patient denies anxiety, insomnia or depression.   PHYSICAL EXAMINATION: GENERAL: The patient is in no acute distress.  VITAL SIGNS: Remarkable for a blood pressure of 87/56, heart rate of 67, respiratory rate of 16, temperature of 98, sat  93% on room air.  HEENT: Normocephalic, atraumatic. Pupils equally round and reactive to light and accommodation. Extraocular movements are intact. Sclerae are anicteric. Conjunctivae are clear. Oropharynx is clear.  NECK: Supple, without JVD or bruits. No adenopathy or thyromegaly is noted.  LUNGS: Clear to auscultation and percussion, without wheezes, rales or rhonchi. No dullness. Respiratory effort is normal.  CARDIAC: Regular rate and rhythm with  normal S1, S2. No significant rubs, murmurs or gallops. PMI is nondisplaced. Chest wall is nontender.  ABDOMEN: Soft, nontender, with normoactive bowel sounds. No organomegaly or masses were appreciated. No hernias or bruits were noted.  EXTREMITIES: Without clubbing, cyanosis, edema. Pulses were 2+ bilaterally.  SKIN: Warm and dry without rash or lesions.  NEUROLOGIC: Cranial nerves II through XII grossly intact. Deep tendon reflexes were symmetric. Motor and sensory exam is nonfocal.  PSYCHIATRIC: Revealed a patient who is alert and oriented to person, place, and time. He was cooperative and used good judgment.   LABORATORY DATA: EKG revealed sinus rhythm at 85 beats per minute with T wave inversion laterally. Chest x-ray revealed chronic obstructive pulmonary disease but was otherwise unremarkable. Head CT done because of his headache was negative. His d-dimer was elevated at 1141. White count was 6.9 with a hemoglobin of 13.3. Glucose 89 with a BUN of 8, creatinine 1.15, with a sodium of 136 and a potassium of 3.3. Total CK was 88, with an MB of less than 0.02.   ASSESSMENT: 1.  Atypical chest pain.  2.  Known coronary artery disease.  3.  Abnormal EKG.  4.  Elevated d-dimer of unclear significance.  5.  Hypokalemia.  6.  Chronic pain syndrome.  7.  Gastroesophageal reflux disease.  8.  History of esophageal spasm.  9.  Chronic obstructive pulmonary disease.   PLAN: The patient will be observed on telemetry. We will begin Lovenox and aspirin. We will obtain a CT of the chest to rule out PE. Will follow serial cardiac enzymes and obtain a cardiology consult. We will follow his sugars and hold his metformin. Supplement potassium at this time. Follow up routine labs in the morning. Further treatment and evaluation will depend upon the patient's progress.   TOTAL TIME SPENT ON THIS PATIENT: 50 minutes.     ____________________________ Duane Lope Judithann Sheen, MD jds:cg D: 03/30/2013 20:02:25  ET T: 03/31/2013 00:23:09 ET JOB#: [id number]  cc: Duane Lope. Judithann Sheen, MD, <Dictator> Kaliope Quinonez Rodena Medin MD ELECTRONICALLY SIGNED 03/31/2013 8:08

## 2014-05-02 NOTE — Discharge Summary (Signed)
PATIENT NAME:  Gregory Hall, Gregory Hall MR#:  [id number] DATE OF BIRTH:  1955/02/05  DATE OF ADMISSION:  03/30/2013 DATE OF DISCHARGE:  03/31/2013  PRESENTING COMPLAINT: Chest pain.   DISCHARGE DIAGNOSES:  1. Chest pain, appears atypical.  2. Chronic anxiety.  3. Coronary artery disease status post stent x2 in the past.  4. Chronic obstructive pulmonary disease, with ongoing tobacco abuse.  5. Pulmonary nodule, 6 mm. Follow up as outpatient. CT scan in 6 to 12 months. Will defer to primary care physician, Dr. Lacie Scotts.   CODE STATUS: Full code.   MEDICATIONS:  1. Diazepam 10 mg t.i.d.  2. Lisinopril 5 mg daily.  3. ProAir HFA 2 puffs q.6 p.r.n.  4. Torsemide 20 mg b.i.d.  5. Triamcinolone 0.1% apply to affected area b.i.d.  6. Calcium carbonate 600 mg b.i.d.  7. Senna 1 tablet b.i.d.  8. Analpram HC 2.5 one applicator full rectal 4 times a day.  9. Nystatin apply to affected area 3 times a day as needed.  10. Oxycodone 30 mg 4 times a day.  11. Trazodone 100 mg 2 tablets daily at bedtime.  12. Ativan 1 mg 1/2 tablet 0.5 mg p.o. once a day at bedtime.  13. Simvastatin 40 mg 1/2 tablet p.o. at bedtime.  14. K-Dur 10 mEq b.i.d.  15. Promethazine 25 mg b.i.d.  16. Protonix 40 mg b.i.d.  17. Metformin 500 mg 1 tablet b.i.d.  18. Symbicort 160/4.5 two puffs b.i.d.  19. Vitamin D3 5000 units capsule p.o. daily.  20. Spiriva 18 mcg inhalation daily.  21. Aspirin enteric-coated 81 mg daily.  22. Polyethylene glycol p.o. daily.  23. A and D apply to affected area.  24. Sarna lotion apply to affected area.  25. Zofran 4 mg 1 tablet b.i.d. as needed.  26. Lorazepam 1 mg b.i.d. at 8:00 and 5:00 p.m.   DIET: Low sodium.   FOLLOWUP:  1. Follow up with Dr. Mariah Milling in 1 to 2 weeks.  2. Follow up with Dr. Lacie Scotts in 2 to 4 weeks.   DIAGNOSTIC STUDIES:  Cardiac enzymes x3 negative.  Basic metabolic panel within normal limits.  CT of the chest shows no PE, bilateral diffuse emphysematous  changes. There is a 6 mm spiculated pulmonary nodule in the anterior segment of right upper lobe. Followup CT scan in 6 to 12 months.   BRIEF SUMMARY OF HOSPITAL COURSE: Gregory Hall is a 59 year old gentleman with history of coronary artery disease, hypertension and chronic anxiety, who comes in with chest pain. He was admitted with:   1. Chest pain, which appeared atypical. The patient had, given the duration of pain, nature of pain and negative work-up, appears to be atypical/musculoskeletal. Dr. Mariah Milling agrees with above input. Recommended to continue all his cardiac meds. His 3 sets of cardiac enzymes and EKG were negative.  2. Known coronary artery disease with stents in the past. Continue aspirin, nitroglycerin p.r.n. and statins.  3. Hypotension. The patient was on IV dopamine temporally; however, blood pressure remained stable. His systolic stayed in the 110s. Dopamine was weaned off. The patient remained stable.  4. Chronic pain syndrome, follows with [facility] in [place]. Continued oxycodone.  5. History of esophageal spasm. Continue PPI.  6. Chronic obstructive pulmonary disease with ongoing tobacco abuse. The patient was advised on smoking cessation.  7. Incidental note of 6 mm spiculated nodule in the anterior segment of right upper lobe. A followup CT in 6 to 12 months is recommended. Will defer it to  Dr. Lacie Scotts to follow up on it.  8. Hospital stay otherwise remained stable.   CODE STATUS: The patient remained a full code.   TIME SPENT: 40 minutes.  ____________________________ Gregory Hail Allena Katz, MD sap:lb D: 04/03/2013 11:18:43 ET T: 04/03/2013 11:41:33 ET JOB#: [id number]  cc: Senai Kingsley A. Allena Katz, MD, <Dictator> Willow Ora MD ELECTRONICALLY SIGNED 04/17/2013 16:20

## 2014-05-02 NOTE — Consult Note (Signed)
General Aspect Gregory Hall is a 59yo Caucasian male w/ PMHx s/f CAD (s/p PCI), carotid artery disease, PVD, DDD, esophageal spasm, hiatal hernia, GERD, COPD, DM2, HLD, HTN, chronic pain and anxiety who was admitted to [facility] yesterday due to chest pain.   He has a history of PCI-LAD 2010. He has had prior stenting to his subclavian. Left subclavian artery stent.  He last followed up w/ 05/2011. No active CAD symptoms. Overall stable from a cardiac standpoint at that time.   He reports experiencing anginal type discomfort radiating to his neck and left arm ~ 1 year ago. He did not seek medical attention. No recurrence since.   On Thursday, he reported experiencing intermittent substernal/epigastric, sharp chest pain w/o radiation aggravated by deep inspiration and palpation. No relation to prior anginal pain. He did note mild nausea. Denied shortness of breath or diaphoresis. The discomfort persisted over the weekend, thus prompting his ED presentation. He continues to smoke 1/2 PPD (30 + pack-year history). He denies PND, orthopnea, DOE/SOB, LE edema, lightheadedness or syncope. He does report cough with occasional yellow sputum production. No fevers or chills. No weight loss.   Present Illness In the ED, EKG showed anterolateral TWIs (unchanged from prior tracings). Initial TnI WNL. CMP- K 3.3, albumin 3.2, otherwise unremarkable. D-dimer was elevated at 1141. CT-A showed no PE, patent L subclavian stent, no aortic dissection. There was a 57m spiculated nodule in the RUL. Bilateral emphysematous changes noted. CXR and noncontrast head CT showed no acute process.   He was given NTG SL in the ED, and became hypotensive. He was admitted by the medicine service for chest pain rule out. He was started on dopamine gtt for BP support. He was transported to CCU.   Two subsequent troponins returned WNL. He reports persistent chest pain. He has received significant pain medications.  PAST MEDICAL HISTORY: 1.   Chronic obstructive pulmonary disease.  2.  Atherosclerotic cardiovascular disease, status post myocardial infarction x3.  3.  Status post percutaneous transluminal coronary angioplasty with stent placement x2.  4.  GE reflux disease.  5.  History of MRSA.  6.  Esophageal spasm.  7.  Hiatal hernia.  8.  Bilateral hearing loss.  9.  Type 2 diabetes. 10.  Benign hypertension.  11.  Degenerative disk disease.  12.  Status post back surgery.  13.  Status post cholecystectomy.   SOCIAL HISTORY: The patient does smoke. Denies alcohol abuse.   FAMILY HISTORY: Positive for coronary artery disease, diabetes, stroke, prostate cancer. Negative for colon cancer.   Physical Exam:  GEN well developed, well nourished, no acute distress   HEENT hearing intact to voice, moist oral mucosa   NECK supple   RESP normal resp effort  clear BS   CARD Regular rate and rhythm  No murmur   ABD positive tenderness  soft  normal BS   LYMPH negative neck   EXTR negative edema   SKIN normal to palpation   NEURO motor/sensory function intact   PSYCH alert, A+O to time, place, person, good insight   Review of Systems:  Subjective/Chief Complaint upper epigastric pain, chronic back pain, headaches   General: Fatigue   Skin: No Complaints   ENT: No Complaints   Eyes: No Complaints   Neck: No Complaints   Respiratory: No Complaints   Cardiovascular: No Complaints   Gastrointestinal: upper epigastric pain   Genitourinary: No Complaints   Vascular: No Complaints   Musculoskeletal: No Complaints   Neurologic: No  Complaints   Hematologic: No Complaints   Endocrine: No Complaints   Psychiatric: No Complaints   Review of Systems: All other systems were reviewed and found to be negative   Medications/Allergies Reviewed Medications/Allergies reviewed     gerd:    MRSA Oct 13, 2009:    Esophageal Spasms:    Hiatal Hernia:    Myocardial Infarct X 3:    HOH:    COPD:     HTN:    Epidural: 21-Jun-2012   TLIF  L4-L5 WITH CAGES: 13-Oct-2010   lumbar fusion:    Lumbar Decompression L4-L5:    Gallbladder Surgery:    right foot surgery x 5:    cardiac stents x 2:        Admit Diagnosis:   CHEST PAIN: Onset Date: 31-Mar-2013, Status: Active, Description: CHEST PAIN  Home Medications: Medication Instructions Status  diazepam 10 mg oral tablet 1 tab(s) orally 3 times a day Active  lisinopril 5 mg oral tablet 1 tab(s) orally once a day Active  ProAir HFA CFC free 90 mcg/inh inhalation aerosol 2 puff(s) inhaled every 6 hours as needed for shortness of Breath/ wheezing. *self administered* Active  torsemide 20 mg oral tablet 1 tab(s) orally 2 times a day Active  triamcinolone topical 0.1% topical cream Apply a small amount topically to legs and back 2 times a day. *patient may administer* Active  calcium carbonate 600 mg oral tablet 1 tab(s) orally 2 times a day Active  Senna Lax 1 tab(s) orally 2 times a day Active  Analpram-HC 2.5%-1% rectal cream 1 applicatorful rectal 4 times a day Active  citalopram 40 mg oral tablet 1 tab(s) orally once a day Active  nystatin topical 100000 units/g topical powder Apply a small amount topically to affected area 3 times a day as needed Active  oxyCODONE 30 mg oral tablet 1 tab(s) orally 4 times a day Active  traZODone 100 mg oral tablet 2 tabs (247m) orally once a day (at bedtime) Active  LORazepam 1 mg oral tablet 0.5 tab (0.565m orally once a day (at bedtime) *note dose* Active  simvastatin 40 mg oral tablet 0.5 tab (2070morally once a day (at bedtime) Active  potassium chloride 10 mEq oral tablet, extended release 1 tab(s) orally 2 times a day Active  LORazepam 1 mg oral tablet 1 tab(s) orally 2 times a day (8am, 5pm). *note dose* Active  promethazine 25 mg oral tablet 1 tab(s) orally 2 times a day Active  pantoprazole 40 mg oral delayed release tablet 1 tab(s) orally 2 times a day Active  metFORMIN 500 mg oral  tablet 1 tab(s) orally 2 times a day before meals (breakfast and supper) Active  Symbicort 160 mcg-4.5 mcg/inh inhalation aerosol 2 puff(s) inhaled 2 times a day Active  furosemide 20 mg oral tablet 1 tab(s) orally once a day Active  Vitamin D3 5000 intl units oral capsule 1 cap(s) orally once a day Active  Spiriva 18 mcg inhalation capsule 1 cap(s) via handihaler once a day Active  Aspirin Enteric Coated 81 mg oral delayed release tablet 1 tab(s) orally once a day Active  polyethylene glycol 3350 - oral powder for reconstitution 1 cap (17 grams) in 8oz of water and drink orally once a day as needed for constipation Active  Nitrostat 0.4 mg sublingual tablet 1 tab(s) sublingual every 5 minutes, As Needed to relieve chest pain. *may repeat every 15 minutes for 3 doses* Active  A & D Apply topically to affected area as  directed. *self administered* Active  Sarna Lotion Apply topically to affected area as needed - for Itching. *keep at bedside* *patient self administered* Active  ondansetron 4 mg oral tablet 1 tab(s) orally 2 times a day, As Needed - for Nausea, Vomiting Active   Lab Results:  Routine Chem:  23-Mar-15 02:24   Glucose, Serum 72  BUN 7  Creatinine (comp) 1.11  Sodium, Serum 142  Potassium, Serum 3.6  Chloride, Serum  111  CO2, Serum 30  Calcium (Total), Serum  7.6  Anion Gap  1  Osmolality (calc) 280  eGFR (African American) >60  eGFR (Non-African American) >60 (eGFR values <61m/min/1.73 m2 may be an indication of chronic kidney disease (CKD). Calculated eGFR is useful in patients with stable renal function. The eGFR calculation will not be reliable in acutely ill patients when serum creatinine is changing rapidly. It is not useful in  patients on dialysis. The eGFR calculation may not be applicable to patients at the low and high extremes of body sizes, pregnant women, and vegetarians.)  Cardiac:  22-Mar-15 18:12   Troponin I < 0.02 (0.00-0.05 0.05 ng/mL or less:  NEGATIVE  Repeat testing in 3-6 hrs  if clinically indicated. >0.05 ng/mL: POTENTIAL  MYOCARDIAL INJURY. Repeat  testing in 3-6 hrs if  clinically indicated. NOTE: An increase or decrease  of 30% or more on serial  testing suggests a  clinically important change)    22:25   Troponin I < 0.02 (0.00-0.05 0.05 ng/mL or less: NEGATIVE  Repeat testing in 3-6 hrs  if clinically indicated. >0.05 ng/mL: POTENTIAL  MYOCARDIAL INJURY. Repeat  testing in 3-6 hrs if  clinically indicated. NOTE: An increase or decrease  of 30% or more on serial  testing suggests a  clinically important change)  23-Mar-15 02:24   CK, Total 58 (39-308 NOTE: NEW REFERENCE RANGE  02/10/2013)  CPK-MB, Serum 1.0 (Result(s) reported on 31 Mar 2013 at 03:17AM.)  Troponin I < 0.02 (0.00-0.05 0.05 ng/mL or less: NEGATIVE  Repeat testing in 3-6 hrs  if clinically indicated. >0.05 ng/mL: POTENTIAL  MYOCARDIAL INJURY. Repeat  testing in 3-6 hrs if  clinically indicated. NOTE: An increase or decrease  of 30% or more on serial  testing suggests a  clinically important change)  Routine Hem:  23-Mar-15 02:24   WBC (CBC) 5.8  RBC (CBC)  3.83  Hemoglobin (CBC)  12.9  Hematocrit (CBC)  37.3  Platelet Count (CBC) 156  MCV 97  MCH 33.6  MCHC 34.5  RDW 14.0  Neutrophil % 35.7  Lymphocyte % 48.6  Monocyte % 9.8  Eosinophil % 4.8  Basophil % 1.1  Neutrophil # 2.1  Lymphocyte # 2.8  Monocyte # 0.6  Eosinophil # 0.3  Basophil # 0.1 (Result(s) reported on 31 Mar 2013 at 03:07AM.)   EKG:  Interpretation EKG showing NSR with T wave ABN in the anterolateral leads, V4 to V6 (old)   Radiology Results: XRay:    22-Mar-15 18:31, Chest Portable Single View  Chest Portable Single View   REASON FOR EXAM:    Chest Pain  COMMENTS:       PROCEDURE: DXR - DXR PORTABLE CHEST SINGLE VIEW  - Mar 30 2013  6:31PM     CLINICAL DATA:  Chest pain, history MI    EXAM:  PORTABLE CHEST - 1 VIEW    COMPARISON:   Portable exam 1822 hr compared to 11/22/2012    FINDINGS:  Normal heart size, mediastinal contours, and pulmonary vascularity.  Vascular stent identified cranial to the aortic arch.    Lungs appear emphysematous with minimal bibasilar atelectasis.    No acute infiltrate, pleural effusion or pneumothorax.    Bones demineralized.     IMPRESSION:  Emphysematous changes with minimal bibasilar atelectasis.      Electronically Signed    By: Lavonia Dana M.D.    On: 03/30/2013 18:48     Verified By: Burnetta Sabin, M.D.,  CT:    22-Mar-15 18:55, CT Head Without Contrast  CT Head Without Contrast   REASON FOR EXAM:    headache for 3 days, on aspirin  COMMENTS:       PROCEDURE: CT  - CT HEAD WITHOUT CONTRAST  - Mar 30 2013  6:55PM     CLINICAL DATA:  Severe headache.    EXAM:  CT HEAD WITHOUT CONTRAST    TECHNIQUE:  Contiguous axial images were obtained from the base of the skull  through the vertex without intravenous contrast.    COMPARISON:  None.  FINDINGS:  No evidence of intracranial hemorrhage, brain edema, or other signs  of acute infarction. No evidence of intracranial mass lesion or mass  effect. No abnormal extraaxial fluid collections identified.  Ventricles are normal in size. No skull abnormality identified.     IMPRESSION:  Negative noncontrast head CT.      Electronically Signed    By: Earle Gell M.D.    On: 03/30/2013 19:03       Verified By: Marlaine Hind, M.D.,    Fentanyl: N/V/Diarrhea  Vital Signs/Nurse's Notes: **Vital Signs.:   23-Mar-15 07:00  Vital Signs Type Routine  Pulse Pulse 62  Respirations Respirations 14  Systolic BP Systolic BP 98  Diastolic BP (mmHg) Diastolic BP (mmHg) 42  Mean BP 60  Pulse Ox % Pulse Ox % 92  Oxygen Delivery 2L  Pulse Ox Heart Rate 62    Impression Gregory Hall is a 59yo Caucasian male w/ PMHx s/f CAD (s/p PCI), carotid artery disease, PVD, DDD, esophageal spasm, hiatal hernia, GERD, COPD, DM2, HLD,  HTN, chronic pain and anxiety who was admitted to [facility] yesterday due to chest pain.   1. chest pain in the setting of CAD -atypical, reproducible with palpation, neg CEx3  -appears musculoskeletal, pleuritic no further workup at this time  2.  Known coronary artery disease with stents x2 in the past -cont asa -statins  3. Hypotension secondary to pain meds. on morphine will d/c dopa, SBP 130 now he does not take lisinopril as he gets dizziness consider changing back to po pain meds, off IV morphine  4. Chronic pain syndrome.  -oxycodone q4 at home per the patient,  - followed by [facility] pain clinic in [place]  6. Gastroesophageal reflux disease.  -cont PPI  7.  History of esophageal spasm.   8.  Chronic obstructive pulmonary disease with ongoing tobacco abuse Nodule in the lung needs follow up for early neoplasm? -advised smopking cessation -Increased thick cough the past week. possible acute on chronic bronchitis   Electronic Signatures: Arguello, Roger A (PA-C)  (Signed 23-Mar-15 10:28)  Authored: General Aspect/Present Illness Ida Rogue (MD)  (Signed 23-Mar-15 12:23)  Authored: General Aspect/Present Illness, History and Physical Exam, Review of System, Past Medical History, Health Issues, Home Medications, Labs, EKG , Radiology, Allergies, Vital Signs/Nurse's Notes, Impression/Plan  Co-Signer: General Aspect/Present Illness   Last Updated: 23-Mar-15 12:23 by Ida Rogue (MD)

## 2014-05-03 NOTE — H&P (Signed)
PATIENT NAME:  Gregory Hall, Gregory Hall MR#:  [id number] DATE OF BIRTH:  Nov 05, 1955  DATE OF ADMISSION:  07/14/2011  REFERRING PHYSICIAN: Dr. Ulice Brilliant PRIMARY CARE PHYSICIAN: Dr. Brunetta Genera   PRESENTING COMPLAINT: Leg pain and redness and swelling with falls.   HISTORY OF PRESENT ILLNESS: Gregory Hall is a 59 year old gentleman with history of hypertension, hyperlipidemia, coronary artery disease, chronic obstructive pulmonary disease and ongoing tobacco use, depression and anxiety, questionable congestive heart failure with chronic pain on narcotics who presents from [facility] assisted [facility] facility with reports of leg pain and swelling and redness. He reports for the past nine months now he has been having issues with these symptoms and was recently treated with Bactrim with improvement, however, for the past two weeks has recurrence of his issues with increased swelling and redness and pain with poor ambulatory effort and also increased falls. He reports chills but no fevers, nausea, or vomiting. No loss of consciousness. He also reports about 60 pound weight gain within the past six months. During interview patient is slurring his speech and groggy but reports that this is his baseline. He is on multiple medications from antipsychotics to muscle relaxants to benzodiazepines and narcotics. Patient reports that also for the past two weeks he has had increased episodes of chest pain and shortness of breath where he has had to use his nitroglycerin pills two times per week where he had minimal use prior to that.   PAST MEDICAL HISTORY:  1. Hyperlipidemia.  2. Hypertension.  3. Coronary artery disease status post PCI to the LAD.  4. Chronic obstructive pulmonary disease and ongoing tobacco use.  5. Spinal stenosis status post lumbar surgery x2.  6. Depression/anxiety.  7. Chronic pain on chronic narcotics.  8. Gastroesophageal reflux disease.  9. EGD from November 2012 revealing for gastritis and colonoscopy  revealing for polyps.  10. Questionable congestive heart failure. Echocardiogram from October 2011 with ejection fraction greater than 55% and reports recent echocardiogram that was also normal per patient.   PAST SURGICAL HISTORY:  1. Tonsillectomy.  2. Right leg and foot surgery.  3. Cholecystectomy.  4. Cardiac stent.  5. Lumbar surgery x2.   ALLERGIES: Fentanyl causes nausea and vomiting.   MEDICATIONS:  1. Citalopram 20 mg daily.  2. Aspirin 81 mg daily.  3. Simvastatin 20 mg at bedtime.  4. Imdur 30 mg daily.  5. Mometasone 0.1% cream apply to affected area daily.  6. Furosemide 20 mg daily.  7. Metoprolol possibly XL 25 mg daily.  8. Lisinopril 5 mg daily.  9. Chlorpromazine 25 mg b.i.d.  10. Lyrica 75 mg b.i.d.  11. Flexeril 5 mg b.i.d.  12. Atarax 25 mg b.i.d.  13. Amitiza 24 mcg b.i.d.  14. Cymbalta 60 mg b.i.d.  15. Nexium 40 mg b.i.d.  16. Potassium chloride 10 mEq b.i.d.  17. Torsemide 20 mg b.i.d.  18. Gabapentin 600 mg t.i.d.  19. Chlorzoxazone 500 mg t.i.d.  20. Diazepam 10 mg t.i.d.  21. Amitriptyline 75 mg at bedtime.  22. Trazodone 150 mg at bedtime.  23. Triamcinolone 0.1% cream to affected area b.i.d. as needed.  24. Benztropine 1 mg b.i.d. as needed.  25. Oxycodone 20 mg at 8:00 a.m. and 10 mg at 8:00 p.m.  26. Sarna lotion to affected area as needed.  27. ProAir HFA 90 mcg 2 puffs every six hours as needed.  28. Nitrostat 0.4 mg sublingual as needed.  29. A and D ointment to affected area as directed.  30. Zofran 4  mg twice a day as needed.  31. Furosemide 20 mg daily as needed.   SOCIAL HISTORY: He lives at [facility] assisted [facility] facility. He denies any current alcohol use. No drug use. Reports 1/2 pack per day of tobacco use.   FAMILY HISTORY: Mother is deceased and sister and mother both had emphysema.    REVIEW OF SYSTEMS: CONSTITUTIONAL: No fevers, nausea, vomiting. Endorses chills. EYES: No visual disturbances. ENT: No epistaxis or  discharge. RESPIRATORY: No hemoptysis. Reports shortness of breath as per history of present illness. CARDIOVASCULAR: As per history of present illness. No orthopnea or palpitations or syncope. GASTROINTESTINAL: No nausea or vomiting. He has intermittent constipation and diarrhea. No hematemesis or melena. He reports abdominal pain mainly epigastric and right side. GENITOURINARY: No dysuria, hematuria. ENDO: No polyuria or polydipsia. SKIN: Reports erythema of bilateral lower extremity. MUSCULOSKELETAL: Reports back pain, leg pain, knee pain. NEUROLOGIC: No one-sided weakness or numbness. Reports falls due to bilateral weakness and pain. PSYCH: Denies any suicidal ideation.   PHYSICAL EXAMINATION:  VITAL SIGNS: Temperature 98.1, pulse 91, respiratory rate 17, blood pressure 137/62, sating at 96% on room air.   GENERAL: Lying in bed in no apparent distress.   HEENT: Normocephalic, atraumatic. Pupils are equal, symmetric, nonicteric. He has slightly dry mucous membrane.   NECK: Soft and supple. No adenopathy. Slightly elevated JVP of approximately 7 cm.   CARDIOVASCULAR: Non-tachy. No murmurs, rubs, or gallops.   LUNGS: Basilar crackles and faint expiratory wheezing. No use of accessory muscles or increased respiratory effort.   ABDOMEN: Soft. Tenderness on the right side and epigastric. No rebound or guarding.   EXTREMITIES: 3+ pitting edema with superficial ulcers and weeping drainage, some warmth and erythema. No knee effusion. Dorsal pedis pulses intact.   MUSCULOSKELETAL: Difficult manipulation of his lower extremities due to the pain.   NEUROLOGIC: He has symmetrical squeeze. No focal deficits.   PSYCH: He is alert and oriented but he is groggy with some slurring of his speech, I suspected due to his multiple medications.   LABORATORY, DIAGNOSTIC AND RADIOLOGICAL DATA: ESR 7, WBC 5.9, hemoglobin 13.2, hematocrit 39.4, platelets 189, MCV 96, glucose 202, BUN 17, creatinine 1.61, sodium  138, potassium 3.8, chloride 102, carbon dioxide 33, calcium 8.1. LFTs within normal limits. CK 200. MB 1.7, troponin less than 0.02. Urinalysis with specific gravity of 1.013, pH 6, 2 per high-power field RBC, 1 per high-power field WBC.   ASSESSMENT AND PLAN: Gregory Hall is a 59 year old gentleman with history of coronary artery disease, hypertension, hyperlipidemia, chronic pain, depression, anxiety, chronic obstructive pulmonary disease and ongoing tobacco use presenting with falls, lower extremity pain, edema, and erythema.  1. Bilateral lower extremity cellulitis/ambulatory dysfunction. His lower extremity Doppler's are negative for deep vein thrombosis bilaterally. Will start on vancomycin. He received Ancef in the ED. Continue pain control. He has history of MRSA infection of his lower extremity. Continue with isolation. Will obtain wound consultation to help with his edema and cellulitis. He reports 60 pound weight gain in the past six months, unsure of the validity, but may be in part due to fluid. Will send TSH. With reports of his abdominal pain and swelling as well as weight gain will obtain an abdominal ultrasound. Will also get a PT evaluation and care management consultation.  2. Acute renal failure questionable with recent Bactrim use and also with diuretics. Will hold his lisinopril, torsemide, furosemide and KCl.  3. Coronary artery disease status post PCI of LAD. Restart aspirin, metoprolol,  simvastatin, nitroglycerin sublingual as needed. Will continue on tele and cycle cardiac enzymes with reports of increased use of nitroglycerin.  4. Chronic obstructive pulmonary disease and ongoing tobacco use with expiratory wheezing on exam. Will use SVNs as needed. Hold off on Solu-Medrol. Continue on nicotine patch. Chest x-ray without infiltrates.  5. Questionable congestive heart failure. Lung exam with some basilar crackles. His last echo documented here was October 2011 with normal ejection  fraction. Reports recent echocardiogram this past year that was normal and documentation from January 2012 echo that also showed normal ejection fraction. As above, questionable if his weight gain is with fluid retention. Continue daily weights, ins and outs. Will stop his IV fluids for now. Hold off on echo unless not done recently.  6. Prophylaxis with Nexium b.i.d., aspirin and heparin sub-Q.    TIME SPENT: Approximately 55 minutes spent on patient care.   ____________________________ Rita Ohara, MD ap:cms D: 07/14/2011 02:10:44 ET T: 07/14/2011 09:53:38 ET JOB#: [id number]  cc: Lucrecia Mcphearson, MD, <Dictator> Meindert A. Brunetta Genera, MD  Rita Ohara MD ELECTRONICALLY SIGNED 07/29/2011 0:24

## 2014-05-03 NOTE — Discharge Summary (Signed)
PATIENT NAME:  Gregory Hall, Gregory Hall MR#:  [id number] DATE OF BIRTH:  1955-09-22  DATE OF ADMISSION:  07/14/2011 DATE OF DISCHARGE:  07/16/2011  PRESENTING COMPLAINT: Difficulty walking and some erythema, redness lower extremity.   DISCHARGE DIAGNOSES:  1. bilateral lower extremity redness with some old skin sores. There does not appear to be evidence of true cellulitis.   2. Bilateral chronic leg pain.  3. Chronic back pain with radicular symptoms.  4. History of coronary artery disease.  5. Chronic obstructive pulmonary disease with ongoing tobacco abuse.  6. Chronic narcotic dependence.   DISPOSITION: Patient will be discharged to family group home with home physical therapy.   FOLLOW UP: Follow up with Dr. Lacie Scotts in 1 to 2 weeks.   DISCHARGE MEDICATIONS:  1. Citalopram 20 mg daily.  2. Aspirin 81 mg daily.  3. Simvastatin 40 mg 1/2 tablet daily at bedtime.  4. Imdur 30 mg extended-release 1/2 tablet daily in the morning.  5. Mometasone 0.1 apply topically to affected area once a day as directed.  6. Lasix 20 mg daily.  7. Chlorpromazine 25 mg b.i.d.  8. Lyrica 75 mg b.i.d.   9. Cyclobenzaprine 5 mg b.i.d.  10. Hydroxyzine hydrochloride 25 mg 1 tablet b.i.d.  11. Amitiza 24 mcg p.o. 1 capsule b.i.d.  12. Cymbalta 60 mg delayed-release p.o. b.i.d.  13. Nexium 40 mg 1 capsule b.i.d.  14. Gabapentin 300 mg 3 times a day.  15. Gabapentin 900 mg total t.i.d.   16. Chlorzoxazone 500 mg 3 times a day.  17. Diazepam 10 mg 3 times a day.  18. Amitriptyline 75 mg at bedtime.  19. Trazodone 150 mg at bedtime.  20. Triamcinolone 0.1 topical cream apply to affected area on the leg.  21. Benztropine 1 mg 2 times a day as needed.  22. Sarna lotion affected area as needed.  23. ProAir HFA 90 mcg/inhalations 2 puffs every six hours.  24. Nitrostat 0.4 mg sublingual 1 every five.  25. A and D topically to affected area daily.  26. Lasix 20 mg daily.  27. Zofran 4 mg twice a day.   28. Oxycodone 10 mg every six hours.  29. Keflex 1 capsule 2 times a day.   LABORATORY, DIAGNOSTIC AND RADIOLOGICAL DATA:  CBC within normal limits.   Comprehensive metabolic panel within normal limits. Hemoglobin A1c 8.1. Cholesterol 136, LDL 63. Echo Doppler is within normal limits. Cardiac enzymes x3 negative. Ultrasound of the abdomen status post cholecystectomy, rest unremarkable. blood cultures no growth in 36 hours. White count 5.9. Urinalysis negative for urinary tract infection. EKG: Normal sinus rhythm.   BRIEF SUMMARY OF HOSPITAL COURSE: Mr. Werth is a 59 year old Caucasian gentleman with multiple medical problems on multiple polypharmacy comes in with:  1. Bilateral lower extremity erythema, patchy. Did not appear to be true cellulitis. Patient was started on some IV antibiotics. He has remained afebrile. White count is normal. Blood cultures are negative. His antibiotics changed to p.o. Keflex. He has ambulatory dysfunction, uses walker to get around. He likely has peripheral vascular disease given his long-standing history of smoking and some pain is likely due to his radicular pain from his chronic back problems.  2. Acute renal failure, resolved. His ACE inhibitor and Lasix were resumed.  3. Coronary artery disease, status post PCI of LAD in the past. He is on aspirin, beta blockers, and simvastatin.  4. Chronic obstructive pulmonary disease with ongoing tobacco abuse. Continued nebulizers.  5. Congestive heart failure, chronic diastolic, appears euvolemic.  Echocardiogram appeared normal with ejection fraction of 55%.  6. History of chronic pain due to multiple back surgeries on pain medications.  7. Depression, anxiety. Continued his Cymbalta, trazodone and Celexa.  8. Hospital stay otherwise remained stable. Patient will be sent to his family group home today. He will follow up with Dr. Lacie Scotts as outpatient.   TIME SPENT: 40 minutes.  ____________________________ Wylie Hail  Allena Katz, MD sap:cms D: 07/16/2011 11:48:39 ET T: 07/17/2011 13:50:23 ET JOB#: [id number]  cc: Vadie Principato A. Allena Katz, MD, <Dictator> Meindert A. Lacie Scotts, MD Willow Ora MD ELECTRONICALLY SIGNED 07/27/2011 13:46

## 2014-08-18 ENCOUNTER — Ambulatory Visit: Payer: Medicare Other | Admitting: Cardiovascular Disease

## 2014-08-20 ENCOUNTER — Encounter: Payer: Self-pay | Admitting: Cardiovascular Disease

## 2014-08-20 ENCOUNTER — Ambulatory Visit (INDEPENDENT_AMBULATORY_CARE_PROVIDER_SITE_OTHER): Payer: Medicare Other | Admitting: Cardiovascular Disease

## 2014-08-20 VITALS — BP 90/58 | HR 75 | Ht 74.0 in | Wt 180.8 lb

## 2014-08-20 DIAGNOSIS — I708 Atherosclerosis of other arteries: Secondary | ICD-10-CM

## 2014-08-20 DIAGNOSIS — Z01810 Encounter for preprocedural cardiovascular examination: Secondary | ICD-10-CM | POA: Diagnosis not present

## 2014-08-20 DIAGNOSIS — I25119 Atherosclerotic heart disease of native coronary artery with unspecified angina pectoris: Secondary | ICD-10-CM

## 2014-08-20 DIAGNOSIS — R0602 Shortness of breath: Secondary | ICD-10-CM | POA: Diagnosis not present

## 2014-08-20 DIAGNOSIS — I771 Stricture of artery: Secondary | ICD-10-CM | POA: Insufficient documentation

## 2014-08-20 NOTE — Patient Instructions (Signed)
Medication Instructions:  Your physician recommends that you continue on your current medications as directed. Please refer to the Current Medication list given to you today.   Labwork: none  Testing/Procedures: Your physician has requested that you have an echocardiogram. Echocardiography is a painless test that uses sound waves to create images of your heart. It provides your doctor with information about the size and shape of your heart and how well your heart's chambers and valves are working. This procedure takes approximately one hour. There are no restrictions for this procedure.  Your physician has requested that you have a carotid duplex. This test is an ultrasound of the carotid arteries in your neck. It looks at blood flow through these arteries that supply the brain with blood. Allow one hour for this exam. There are no restrictions or special instructions.    Follow-Up: Your physician recommends that you schedule a follow-up appointment in: three months with Dr. Kirke Corin.    Any Other Special Instructions Will Be Listed Below (If Applicable).  Echocardiogram An echocardiogram, or echocardiography, uses sound waves (ultrasound) to produce an image of your heart. The echocardiogram is simple, painless, obtained within a short period of time, and offers valuable information to your health care provider. The images from an echocardiogram can provide information such as:  Evidence of coronary artery disease (CAD).  Heart size.  Heart muscle function.  Heart valve function.  Aneurysm detection.  Evidence of a past heart attack.  Fluid buildup around the heart.  Heart muscle thickening.  Assess heart valve function. LET [facility] CARE PROVIDER KNOW ABOUT:  Any allergies you have.  All medicines you are taking, including vitamins, herbs, eye drops, creams, and over-the-counter medicines.  Previous problems you or members of your family have had with the use of  anesthetics.  Any blood disorders you have.  Previous surgeries you have had.  Medical conditions you have.  Possibility of pregnancy, if this applies. BEFORE THE PROCEDURE  No special preparation is needed. Eat and drink normally.  PROCEDURE   In order to produce an image of your heart, gel will be applied to your chest and a wand-like tool (transducer) will be moved over your chest. The gel will help transmit the sound waves from the transducer. The sound waves will harmlessly bounce off your heart to allow the heart images to be captured in real-time motion. These images will then be recorded.  You may need an IV to receive a medicine that improves the quality of the pictures. AFTER THE PROCEDURE You may return to your normal schedule including diet, activities, and medicines, unless your health care provider tells you otherwise. Document Released: 12/24/1999 Document Revised: 05/12/2013 Document Reviewed: 09/02/2012 [facility] Patient Information 2015 [place], [place]. This information is not intended to replace advice given to you by your health care provider. Make sure you discuss any questions you have with your health care provider.

## 2014-08-20 NOTE — Assessment & Plan Note (Signed)
Blood pressure in the right arm was 108/60 and in the left arm was 92/58. The patient had previous left subclavian artery stents and possibly there is restenosis. He does not seem to have significant left arm claudication. His blood pressure should always be checked in the right arm which more accurately reflects central aortic pressure. I requested carotid Doppler to evaluate left subclavian artery stent and vertebral flow.

## 2014-08-20 NOTE — Progress Notes (Signed)
HPI Comments: 59 yo male patient of Gregory Hall who is here today for preoperative cardiovascular evaluation. He has known history of CAD, PCI of the LAD in 2010, repeat cath in 03/2009, hyperlipidemia, HTN, psychiatric hx, long smoking hx who continues to smoke , previous  treatment for MRSA and prior stenting to his left subclavian artery .  History  2 back surgeries. He continues to have back pain.  Admitted to the hospital 03/30/2013 with chest pain, positive d-dimer. CT scan showed no PE, bilateral emphysema, 6 mm spiculated nodule in the right upper lobe Cardiac workup was negative with normal EKG, negative cardiac enzymes. Stress test not performed given atypical nature of his symptoms. 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   He was noted recently to be hypotensive but asymptomatic. Lisinopril was discontinued. His blood pressure has been checked in the left arm and not the right arm. He denies any chest pain. He has chronic exertional dyspnea. He needs to have back surgery.  He had recent preop labs performed and overall were unremarkable.    No Known Allergies   Current Outpatient Prescriptions on File Prior to Visit  Medication Sig Dispense Refill  . aspirin 81 MG tablet Take 81 mg by mouth daily.      . budesonide-formoterol (SYMBICORT) 160-4.5 MCG/ACT inhaler Inhale 2 puffs into the lungs 2 (two) times daily.    . Calcium Carbonate (CALCIUM 600 PO) Take 1 tablet by mouth 2 (two) times daily.    . Cholecalciferol (VITAMIN D-3) 5000 UNITS TABS Take 1 tablet by mouth daily.    . citalopram (CELEXA) 20 MG tablet Take 40 mg by mouth daily.     . diazepam (VALIUM) 10 MG tablet Take 10 mg by mouth 3 (three) times daily.      . metFORMIN (GLUCOPHAGE) 500 MG tablet Take 500 mg by mouth 2 (two) times daily with a meal.    . nitroGLYCERIN (NITROSTAT) 0.4 MG SL tablet Place 1 tablet (0.4 mg total) under the tongue every  5 (five) minutes as needed for chest pain. 25 tablet 3  . oxyCODONE (OXYCONTIN) 10 MG 12 hr tablet Take 30 mg by mouth 4 (four) times daily.     . pantoprazole (PROTONIX) 40 MG tablet Take 40 mg by mouth 2 (two) times daily.     . polyethylene glycol (MIRALAX / GLYCOLAX) packet Take 17 g by mouth daily.    . potassium chloride (K-DUR) 10 MEQ tablet Take 10 mEq by mouth 2 (two) times daily.    . promethazine (PHENERGAN) 25 MG tablet Take 25 mg by mouth 2 (two) times daily.    . Sennosides (SENNA LAX PO) Take 1 tablet by mouth 2 (two) times daily.    Marland Kitchen tiotropium (SPIRIVA) 18 MCG inhalation capsule Place 18 mcg into inhaler and inhale daily.    . traZODone (DESYREL) 100 MG tablet Take 200 mg by mouth at bedtime.    . triamcinolone (KENALOG) 0.1 % cream Apply 1 application topically 2 (two) times daily.       No current facility-administered medications on file prior to visit.     Past Medical History  Diagnosis Date  . Hypertension   . Dyslipidemia   . Chronic pain syndrome   . Nutcracker esophagus   . GERD (gastroesophageal reflux disease)   . Degenerative cervical disc   . MRSA (methicillin resistant staph aureus) culture positive 2011  . CAD (  coronary artery disease)     s/p PTCA and stent x2  . Hernia 2014  . Chest pain   . Rectus diastasis 07/19/2012  . Neuropathy      Past Surgical History  Procedure Laterality Date  . Coronary angioplasty with stent placement  2009    stents x2, in [place], [place]  . Tonsillectomy    . Cholecystectomy    . Foot surgery      Right  . Back surgery  2012  . Spine surgery  2012,2013  . Colonoscopy  Jan 2014    [name]     Family History  Problem Relation Age of Onset  . Family history unknown: Yes     Social History   Social History  . Marital Status: Single    Spouse Name: N/A  . Number of Children: N/A  . Years of Education: N/A   Occupational History  . Not on file.   Social History Main Topics  . Smoking status:  Current Every Day Smoker -- 0.50 packs/day for 30 years  . Smokeless tobacco: Not on file  . Alcohol Use: No  . Drug Use: No  . Sexual Activity: Not on file   Other Topics Concern  . Not on file   Social History Narrative      PHYSICAL EXAM   BP 90/58 mmHg  Pulse 75  Ht 6\' 2"  (1.88 m)  Wt 180 lb 12 oz (81.988 kg)  BMI 23.20 kg/m2 Constitutional: He is oriented to person, place, and time. He appears well-developed and well-nourished. No distress.  HENT: No nasal discharge.  Head: Normocephalic and atraumatic.  Eyes: Pupils are equal and round.  No discharge. Neck: Normal range of motion. Neck supple. No JVD present. No thyromegaly present.  Cardiovascular: Normal rate, regular rhythm, normal heart sounds. Exam reveals no gallop and no friction rub. No murmur heard.  Pulmonary/Chest: Effort normal and breath sounds normal. No stridor. No respiratory distress. He has no wheezes. He has no rales. He exhibits no tenderness.  Abdominal: Soft. Bowel sounds are normal. He exhibits no distension. There is no tenderness. There is no rebound and no guarding.  Musculoskeletal: Normal range of motion. He exhibits no edema and no tenderness.  Neurological: He is alert and oriented to person, place, and time. Coordination normal.  Skin: Skin is warm and dry. No rash noted. He is not diaphoretic. No erythema. No pallor.  Psychiatric: He has a normal mood and affect. His behavior is normal. Judgment and thought content normal.  Radial pulse is absent on the left side and normal on the right side      Rhythm  -  Nonspecific T-abnormality.   ABNORMAL    ASSESSMENT AND PLAN

## 2014-08-20 NOTE — Assessment & Plan Note (Signed)
He has no symptoms of angina. Continue medical therapy. He reports dyspnea with no recent evaluation for LV systolic function. I requested an echocardiogram.

## 2014-08-20 NOTE — Assessment & Plan Note (Signed)
The patient has no anginal symptoms and has reasonable functional capacity. His EKG is unremarkable. His blood pressure is falsely low on the left arm likely due to left subclavian stenosis. He can proceed with surgery and should be considered at low risk overall. I do not recommend ischemic workup. Aspirin can be held 5-7 days before surgery if needed.

## 2014-08-24 ENCOUNTER — Telehealth: Payer: Self-pay | Admitting: Cardiovascular Disease

## 2014-08-24 ENCOUNTER — Telehealth: Payer: Self-pay | Admitting: *Deleted

## 2014-08-24 NOTE — Telephone Encounter (Signed)
S/w pt regarding medication list. Will mail pt updated list. Pt agreeable and verbalized understanding with no further questions.

## 2014-08-24 NOTE — Telephone Encounter (Signed)
Patient calling to check status of original copy of MAR he left with nurse at appt.   Please call.

## 2014-08-24 NOTE — Telephone Encounter (Signed)
Pt is calling asking if we can find the "original medication list" he brought from nursing home.  States it is a blue paper with some red on it.  He is asking once we find it if we can mail it to him Once we find it please call patient.   [address]  [place] [address]

## 2014-08-27 ENCOUNTER — Telehealth: Payer: Self-pay | Admitting: Cardiovascular Disease

## 2014-08-27 NOTE — Telephone Encounter (Signed)
PA from [facility] calling for notes from visit.  She was able to access through CE .

## 2014-08-28 ENCOUNTER — Other Ambulatory Visit: Payer: Self-pay

## 2014-08-28 ENCOUNTER — Ambulatory Visit (INDEPENDENT_AMBULATORY_CARE_PROVIDER_SITE_OTHER): Payer: Medicare Other

## 2014-08-28 DIAGNOSIS — I25119 Atherosclerotic heart disease of native coronary artery with unspecified angina pectoris: Secondary | ICD-10-CM

## 2014-08-28 DIAGNOSIS — R0602 Shortness of breath: Secondary | ICD-10-CM

## 2014-09-07 ENCOUNTER — Emergency Department
Admission: EM | Admit: 2014-09-07 | Discharge: 2014-09-08 | Disposition: A | Discharge status: Home / Self Care | Payer: Medicare Other | Attending: Emergency Medicine | Admitting: Emergency Medicine

## 2014-09-07 ENCOUNTER — Emergency Department: Payer: Medicare Other

## 2014-09-07 ENCOUNTER — Encounter: Payer: Self-pay | Admitting: Emergency Medicine

## 2014-09-07 DIAGNOSIS — G8918 Other acute postprocedural pain: Secondary | ICD-10-CM | POA: Diagnosis not present

## 2014-09-07 DIAGNOSIS — M542 Cervicalgia: Secondary | ICD-10-CM | POA: Diagnosis present

## 2014-09-07 DIAGNOSIS — Z79899 Other long term (current) drug therapy: Secondary | ICD-10-CM | POA: Insufficient documentation

## 2014-09-07 DIAGNOSIS — I10 Essential (primary) hypertension: Secondary | ICD-10-CM | POA: Insufficient documentation

## 2014-09-07 DIAGNOSIS — Z720 Tobacco use: Secondary | ICD-10-CM | POA: Diagnosis not present

## 2014-09-07 DIAGNOSIS — Z7982 Long term (current) use of aspirin: Secondary | ICD-10-CM | POA: Diagnosis not present

## 2014-09-07 MED ORDER — MORPHINE SULFATE (PF) 4 MG/ML IV SOLN
4.0000 mg | Freq: Once | INTRAVENOUS | Status: AC
Start: 2014-09-07 — End: 2014-09-07
  Administered 2014-09-07: 4 mg via INTRAMUSCULAR
  Filled 2014-09-07: qty 1

## 2014-09-07 NOTE — ED Notes (Signed)
Patient transported to MRI by ED Tech.   

## 2014-09-07 NOTE — ED Notes (Signed)
MD at bedside hallway, updating pt on MRI results and current plan of care. No further needs at this time.

## 2014-09-07 NOTE — ED Provider Notes (Signed)
[facility] Emergency Department Provider Note  ____________________________________________  Time seen: Seen upon arrival to the emergency department  I have reviewed the triage vital signs and the nursing notes.   HISTORY  Chief Complaint Arm Pain    HPI Gregory Hall is a 59 y.o. male with a history of a cervical fusion 1 week ago who is presenting with bilateral upper extremity weakness as well as increased pain to his cervical spine as well as left shoulder. He was sent from his rehabilitation center after consultation with Dr. Fredia Beets, one of the [facility] cervical spine specialist. He was sent to the emergency department for an MRI for evaluation of the postoperative findings. The patient denies any loss of bowel or bladder continence. Denies any weakness to the legs.No respiratory distress or difficulty breathing.   Past Medical History  Diagnosis Date  . Hypertension   . Dyslipidemia   . Chronic pain syndrome   . Nutcracker esophagus   . GERD (gastroesophageal reflux disease)   . Degenerative cervical disc   . MRSA (methicillin resistant staph aureus) culture positive 2011  . CAD (coronary artery disease)     s/p PTCA and stent x2  . Hernia 2014  . Chest pain   . Rectus diastasis 07/19/2012  . Neuropathy     Patient Active Problem List   Diagnosis Date Noted  . Subclavian artery stenosis, left 08/20/2014  . Rectus diastasis 07/19/2012  . Hernia   . Edema 05/10/2011  . Preoperative cardiovascular examination 10/06/2010  . SMOKER 11/25/2009  . CAROTID BRUIT, RIGHT 11/24/2009  . CHEST PAIN-UNSPECIFIED 11/24/2009  . DYSLIPIDEMIA 03/30/2009  . Coronary atherosclerosis 03/30/2009  . HYPERTENSION, HX OF 03/30/2009    Past Surgical History  Procedure Laterality Date  . Coronary angioplasty with stent placement  2009    stents x2, in [place], [place]  . Tonsillectomy    . Cholecystectomy    . Foot surgery      Right  . Back surgery   2012  . Spine surgery  2012,2013  . Colonoscopy  Jan 2014    [name]    Current Outpatient Rx  Name  Route  Sig  Dispense  Refill  . aspirin 81 MG tablet   Oral   Take 81 mg by mouth daily.           . budesonide-formoterol (SYMBICORT) 160-4.5 MCG/ACT inhaler   Inhalation   Inhale 2 puffs into the lungs 2 (two) times daily.         . Calcium Carbonate (CALCIUM 600 PO)   Oral   Take 1 tablet by mouth 2 (two) times daily.         . Cholecalciferol (VITAMIN D-3) 5000 UNITS TABS   Oral   Take 1 tablet by mouth daily.         . citalopram (CELEXA) 20 MG tablet   Oral   Take 40 mg by mouth daily.          . diazepam (VALIUM) 10 MG tablet   Oral   Take 10 mg by mouth 3 (three) times daily.           . furosemide (LASIX) 20 MG tablet   Oral   Take 20 mg by mouth.         . Linaclotide (LINZESS) 145 MCG CAPS capsule   Oral   Take 145 mcg by mouth daily.         . metFORMIN (GLUCOPHAGE) 500 MG tablet  Oral   Take 500 mg by mouth 2 (two) times daily with a meal.         . nitroGLYCERIN (NITROSTAT) 0.4 MG SL tablet   Sublingual   Place 1 tablet (0.4 mg total) under the tongue every 5 (five) minutes as needed for chest pain.   25 tablet   3   . oxyCODONE (OXYCONTIN) 10 MG 12 hr tablet   Oral   Take 30 mg by mouth 4 (four) times daily.          . pantoprazole (PROTONIX) 40 MG tablet   Oral   Take 40 mg by mouth 2 (two) times daily.          . polyethylene glycol (MIRALAX / GLYCOLAX) packet   Oral   Take 17 g by mouth daily.         . potassium chloride (K-DUR) 10 MEQ tablet   Oral   Take 10 mEq by mouth 2 (two) times daily.         . promethazine (PHENERGAN) 25 MG tablet   Oral   Take 25 mg by mouth 2 (two) times daily.         . Sennosides (SENNA LAX PO)   Oral   Take 1 tablet by mouth 2 (two) times daily.         . tamsulosin (FLOMAX) 0.4 MG CAPS capsule   Oral   Take 0.4 mg by mouth.         . tiotropium (SPIRIVA) 18  MCG inhalation capsule   Inhalation   Place 18 mcg into inhaler and inhale daily.         . traZODone (DESYREL) 100 MG tablet   Oral   Take 200 mg by mouth at bedtime.         . triamcinolone (KENALOG) 0.1 % cream   Topical   Apply 1 application topically 2 (two) times daily.             Allergies Review of patient's allergies indicates no known allergies.  Family History  Problem Relation Age of Onset  . Family history unknown: Yes    Social History Social History  Substance Use Topics  . Smoking status: Current Every Day Smoker -- 0.50 packs/day for 30 years  . Smokeless tobacco: None  . Alcohol Use: No    Review of Systems Constitutional: No fever/chills Eyes: No visual changes. ENT: No sore throat. Cardiovascular: Denies chest pain. Respiratory: Denies shortness of breath. Gastrointestinal: No abdominal pain.  No nausea, no vomiting.  No diarrhea.  No constipation. Genitourinary: Negative for dysuria. Musculoskeletal: Negative for back pain. Skin: Negative for rash. Neurological: Negative for headaches, focal weakness or numbness.  10-point ROS otherwise negative.  ____________________________________________   PHYSICAL EXAM:  VITAL SIGNS: ED Triage Vitals  Enc Vitals Group     BP 09/07/14 1824 107/62 mmHg     Pulse Rate 09/07/14 1824 90     Resp 09/07/14 1821 16     Temp 09/07/14 1824 99.7 F (37.6 C)     Temp Source 09/07/14 1824 Oral     SpO2 09/07/14 1824 90 %     Weight 09/07/14 1824 178 lb (80.74 kg)     Height 09/07/14 1824 6\' 2"  (1.88 m)     Head Cir --      Peak Flow --      Pain Score 09/07/14 1825 9     Pain Loc --      Pain  Edu? --      Excl. in GC? --     Constitutional: Alert and oriented. Well appearing and in no acute distress. Eyes: Conjunctivae are normal. PERRL. EOMI. Head: Atraumatic. Nose: No congestion/rhinnorhea. Mouth/Throat: Mucous membranes are moist.  Oropharynx non-erythematous. Neck: No stridor.  Patient  wearing [place] collar. Anterior approach incisions with clean dry and intact dressings. No fullness anteriorly. No tenderness to palpation posteriorly to the cervical spine or deformity. Cardiovascular: Normal rate, regular rhythm. Grossly normal heart sounds.  Good peripheral circulation. Respiratory: Normal respiratory effort.  No retractions. Lungs CTAB. Gastrointestinal: Soft and nontender. No distention. No abdominal bruits. No CVA tenderness. Musculoskeletal: No lower extremity tenderness nor edema.  No joint effusions. Neurologic:  Normal speech and language.  No gait instability. Strong 4-5 strength of bilateral upper extremities. There is some reduced strength components secondary to pain on flexion of the bilateral extremities. The pain is to the left trapezius region. No sensory deficits. Bilateral radial pulses are present and equal. 5 out of 5 strength to bilateral lower extremities without any saddle anesthesia.  Skin:  Skin is warm, dry and intact. No rash noted. Psychiatric: Mood and affect are normal. Speech and behavior are normal.  ____________________________________________   LABS (all labs ordered are listed, but only abnormal results are displayed)  Labs Reviewed - No data to display ____________________________________________  EKG   ____________________________________________  RADIOLOGY  MRI with recent discectomy and fusion. No obvious epidural hematoma or abscess. No cord compression at any level. ____________________________________________   PROCEDURES   ____________________________________________   INITIAL IMPRESSION / ASSESSMENT AND PLAN / ED COURSE  Pertinent labs & imaging results that were available during my care of the patient were reviewed by me and considered in my medical decision making (see chart for details).  ----------------------------------------- 8:48 PM on 09/07/2014 -----------------------------------------  Discussed the  case with Dr.Grimm of [facility] who recommends a MRI without contrast of the cervical spine specifically looking for adequate placement of the hardware. He recommends that if the hardware is in place and there are no other acute findings of the patient may follow up in clinic at his scheduled follow-up appointment.  ----------------------------------------- 11:36 PM on 09/07/2014 -----------------------------------------  Patient resting comfortably throughout his emergency department stay. I did talk to Dr. Josetta Huddle of [facility] regarding the MRI. We reviewed the current image together and he compared it to the read of a previous MRI. There do not appear to be any acute or new findings requiring any intervention at this time. The patient will be discharged back to his rehabilitation. Will follow-up at his scheduled appointment on September 13 with orthopedics. Patient at this time had no worsening of his symptoms or weakness. ____________________________________________   FINAL CLINICAL IMPRESSION(S) / ED DIAGNOSES  Acute postoperative pain. Initial visit.    Myrna Blazer, MD 09/07/14 [phone]

## 2014-09-07 NOTE — ED Notes (Signed)
Patient back from MRI.

## 2014-09-07 NOTE — ED Notes (Signed)
Pt comes from [facility] group home. Had cervical fusion on Tuesday. Patient complains of left arm pain sent here for evaluation.

## 2014-09-07 NOTE — ED Notes (Signed)
Pt noted resting comfortably in hallway bed at this time.

## 2014-09-07 NOTE — ED Notes (Signed)
Pt changed out of personal clothing. Metal objects/personal items clear out of body.

## 2014-09-08 NOTE — ED Notes (Signed)
Patient with no complaints at this time. Respirations even and unlabored. Skin warm/dry. Discharge instructions reviewed with patient at this time. Patient given opportunity to voice concerns/ask questions. Patient discharged at this time and left Emergency Department, via wheelchair.   

## 2014-11-23 ENCOUNTER — Ambulatory Visit (INDEPENDENT_AMBULATORY_CARE_PROVIDER_SITE_OTHER): Payer: Medicare Other | Admitting: Cardiovascular Disease

## 2014-11-23 ENCOUNTER — Encounter: Payer: Self-pay | Admitting: Cardiovascular Disease

## 2014-11-23 VITALS — BP 101/71 | HR 110 | Ht 74.0 in | Wt 174.2 lb

## 2014-11-23 DIAGNOSIS — I25119 Atherosclerotic heart disease of native coronary artery with unspecified angina pectoris: Secondary | ICD-10-CM

## 2014-11-23 DIAGNOSIS — I771 Stricture of artery: Secondary | ICD-10-CM

## 2014-11-23 DIAGNOSIS — I708 Atherosclerosis of other arteries: Secondary | ICD-10-CM

## 2014-11-23 DIAGNOSIS — I25118 Atherosclerotic heart disease of native coronary artery with other forms of angina pectoris: Secondary | ICD-10-CM

## 2014-11-23 DIAGNOSIS — E785 Hyperlipidemia, unspecified: Secondary | ICD-10-CM

## 2014-11-23 DIAGNOSIS — R079 Chest pain, unspecified: Secondary | ICD-10-CM

## 2014-11-23 DIAGNOSIS — F17200 Nicotine dependence, unspecified, uncomplicated: Secondary | ICD-10-CM

## 2014-11-23 NOTE — Assessment & Plan Note (Signed)
The patient is not on a statin. The reason is not entirely clear and he mentions no previous side effects. Given that he has coronary artery disease and diabetes, there is a strong indication for treatment with a statin. I requested fasting lipid and liver profile and will likely start him once we get the results.

## 2014-11-23 NOTE — Assessment & Plan Note (Signed)
This was not confirmed by imaging. Nonetheless, his left radial pulse is weaker than the right with discrepancy in blood pressure between the 2 arms. He is not having any left arm claudication and thus I do not recommend any revascularization. Blood pressure should be checked in the right arm which reflects central aortic pressure better.

## 2014-11-23 NOTE — Assessment & Plan Note (Signed)
I discussed with him the importance of smoking cessation is not able to quit at the present time as he is under significant stress.

## 2014-11-23 NOTE — Patient Instructions (Signed)
Medication Instructions:  Your physician recommends that you continue on your current medications as directed. Please refer to the Current Medication list given to you today.   Labwork: Fasting lipid and liver profile. Nothing to eat or drink after midnight the evening before your labs.   Testing/Procedures: none  Follow-Up: Your physician recommends that you schedule a follow-up appointment in: three months with Dr. Mariah Milling   Any Other Special Instructions Will Be Listed Below (If Applicable).     If you need a refill on your cardiac medications before your next appointment, please call your pharmacy.

## 2014-11-23 NOTE — Assessment & Plan Note (Signed)
The patient had one episode of chest pain 2 weeks ago with no recurrent symptoms. His EKG does not show any acute changes. He has not had any recent ischemic cardiac evaluation and he has known history of coronary artery disease. I think a stress test as needed.However, he is still wearing a neck collar with significant back pain. He is still not able to lie flat for a nuclear stress test. I will have him follow-up with Dr. Mariah Milling in 3 months to reevaluate this and consider obtaining a stress test then once he is recovered from his neck surgery.

## 2014-11-23 NOTE — Progress Notes (Signed)
HPI Comments: 59 yo male patient of Dr. Mariah Milling who is here today for a follow-up visit.  He has known history of CAD, PCI of the LAD in 2010, repeat cath in 03/2009, hyperlipidemia, HTN, psychiatric hx, long smoking hx who continues to smoke , previous  treatment for MRSA and prior stenting to his left subclavian artery .  History  2 back surgeries. He continues to have back pain.  Admitted to the hospital 03/30/2013 with chest pain, positive d-dimer. CT scan showed no PE, bilateral emphysema, 6 mm spiculated nodule in the right upper lobe Cardiac workup was negative with normal EKG, negative cardiac enzymes. Stress test not performed given atypical nature of his symptoms. 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   I saw him a few months ago for preoperative cardiovascular evaluation prior to next surgery. His blood pressure was noted to be low in the left arm with discrepancy between the 2 arms. Lisinopril was discontinued. Left subclavian/axillary artery stenosis was suspected. He underwent carotid Doppler which showed mild nonsignificant bilateral carotid disease with no evidence of subclavian artery stenosis. Echocardiogram was done for a cardiac murmur which showed normal LV systolic function with no significant valvular abnormalities. The patient underwent surgery without complications. He continues to wear a collar and he still having significant neck pain. He had one episode of chest pain 2 weeks ago which responded to one nitroglycerin. No recurrent symptoms since then.    Allergies  Allergen Reactions  . Acetaminophen     Other reaction(s): Other (See Comments) Kidney failure     Current Outpatient Prescriptions on File Prior to Visit  Medication Sig Dispense Refill  . aspirin 81 MG tablet Take 81 mg by mouth daily.      . budesonide-formoterol (SYMBICORT) 160-4.5 MCG/ACT inhaler Inhale 2 puffs into the lungs 2  (two) times daily.    . Calcium Carbonate (CALCIUM 600 PO) Take 1 tablet by mouth 2 (two) times daily.    . Cholecalciferol (VITAMIN D-3) 5000 UNITS TABS Take 1 tablet by mouth daily.    . citalopram (CELEXA) 20 MG tablet Take 40 mg by mouth daily.     . diazepam (VALIUM) 10 MG tablet Take 10 mg by mouth 3 (three) times daily.      . furosemide (LASIX) 20 MG tablet Take 20 mg by mouth.    . Linaclotide (LINZESS) 145 MCG CAPS capsule Take 145 mcg by mouth daily.    . metFORMIN (GLUCOPHAGE) 500 MG tablet Take 500 mg by mouth 2 (two) times daily with a meal.    . nitroGLYCERIN (NITROSTAT) 0.4 MG SL tablet Place 1 tablet (0.4 mg total) under the tongue every 5 (five) minutes as needed for chest pain. 25 tablet 3  . oxycodone (ROXICODONE) 30 MG immediate release tablet Take 45 mg by mouth every 4 (four) hours as needed for pain. For 6 days, starting 09/04/14 no more than 5 times a day.    . pantoprazole (PROTONIX) 40 MG tablet Take 40 mg by mouth 2 (two) times daily.     . polyethylene glycol (MIRALAX / GLYCOLAX) packet Take 17 g by mouth daily.    . potassium chloride (K-DUR) 10 MEQ tablet Take 10 mEq by mouth 2 (two) times daily.    . promethazine (PHENERGAN) 25 MG tablet Take 25 mg by mouth 2 (two) times daily.    . Sennosides (SENNA LAX PO) Take 1 tablet by  mouth 2 (two) times daily.    . tamsulosin (FLOMAX) 0.4 MG CAPS capsule Take 0.4 mg by mouth.    . tiotropium (SPIRIVA) 18 MCG inhalation capsule Place 18 mcg into inhaler and inhale daily.    . traZODone (DESYREL) 100 MG tablet Take 200 mg by mouth at bedtime.     No current facility-administered medications on file prior to visit.     Past Medical History  Diagnosis Date  . Hypertension   . Dyslipidemia   . Chronic pain syndrome   . Nutcracker esophagus   . GERD (gastroesophageal reflux disease)   . Degenerative cervical disc   . MRSA (methicillin resistant staph aureus) culture positive 2011  . CAD (coronary artery disease)      s/p PTCA and stent x2  . Hernia 2014  . Chest pain   . Rectus diastasis 07/19/2012  . Neuropathy [facility])      Past Surgical History  Procedure Laterality Date  . Coronary angioplasty with stent placement  2009    stents x2, in [place], [place]  . Tonsillectomy    . Cholecystectomy    . Foot surgery      Right  . Back surgery  2012  . Spine surgery  2012,2013  . Colonoscopy  Jan 2014    [name]  . Neck surgery       Family History  Problem Relation Age of Onset  . Family history unknown: Yes     Social History   Social History  . Marital Status: Single    Spouse Name: N/A  . Number of Children: N/A  . Years of Education: N/A   Occupational History  . Not on file.   Social History Main Topics  . Smoking status: Current Every Day Smoker -- 0.50 packs/day for 30 years  . Smokeless tobacco: Not on file  . Alcohol Use: No  . Drug Use: No  . Sexual Activity: Not on file   Other Topics Concern  . Not on file   Social History Narrative      PHYSICAL EXAM   BP 101/71 mmHg  Pulse 110  Ht 6\' 2"  (1.88 m)  Wt 174 lb 4 oz (79.039 kg)  BMI 22.36 kg/m2 Constitutional: He is oriented to person, place, and time. He appears well-developed and well-nourished. No distress.  HENT: No nasal discharge.  Head: Normocephalic and atraumatic.  Eyes: Pupils are equal and round.  No discharge. Neck: Normal range of motion. Neck supple. No JVD present. No thyromegaly present.  Cardiovascular: Tachycardic, regular rhythm, normal heart sounds. Exam reveals no gallop and no friction rub. 2/6 holosystolic murmur at the left sternal border.  Pulmonary/Chest: Effort normal and breath sounds normal. No stridor. No respiratory distress. He has no wheezes. He has no rales. He exhibits no tenderness.  Abdominal: Soft. Bowel sounds are normal. He exhibits no distension. There is no tenderness. There is no rebound and no guarding.  Musculoskeletal: Normal range of motion. He exhibits no edema  and no tenderness.  Neurological: He is alert and oriented to person, place, and time. Coordination normal.  Skin: Skin is warm and dry. No rash noted. He is not diaphoretic. No erythema. No pallor.  Psychiatric: He has a normal mood and affect. His behavior is normal. Judgment and thought content normal.  Radial pulse is weak on the left side and normal on the right side     EKG: Sinus tachycardia -  Nonspecific T-abnormality.   ABNORMAL    ASSESSMENT AND  PLAN

## 2014-12-07 ENCOUNTER — Other Ambulatory Visit (INDEPENDENT_AMBULATORY_CARE_PROVIDER_SITE_OTHER): Payer: Medicare Other

## 2014-12-07 DIAGNOSIS — E785 Hyperlipidemia, unspecified: Secondary | ICD-10-CM | POA: Diagnosis not present

## 2014-12-08 LAB — LIPID PANEL
Chol/HDL Ratio: 5.3 ratio units — ABNORMAL HIGH (ref 0.0–5.0)
Cholesterol, Total: 164 mg/dL (ref 100–199)
HDL: 31 mg/dL — ABNORMAL LOW (ref >39)
LDL Calculated: 109 mg/dL — ABNORMAL HIGH (ref 0–99)
Triglycerides: 119 mg/dL (ref 0–149)
VLDL Cholesterol Cal: 24 mg/dL (ref 5–40)

## 2014-12-08 LAB — HEPATIC FUNCTION PANEL
ALT: 6 IU/L (ref 0–44)
AST: 13 IU/L (ref 0–40)
Albumin: 3.6 g/dL (ref 3.6–4.8)
Alkaline Phosphatase: 77 IU/L (ref 39–117)
Bilirubin Total: 0.3 mg/dL (ref 0.0–1.2)
Bilirubin, Direct: 0.11 mg/dL (ref 0.00–0.40)
Total Protein: 6 g/dL (ref 6.0–8.5)

## 2014-12-10 ENCOUNTER — Other Ambulatory Visit: Payer: Self-pay

## 2014-12-10 DIAGNOSIS — E785 Hyperlipidemia, unspecified: Secondary | ICD-10-CM

## 2014-12-10 MED ORDER — ATORVASTATIN CALCIUM 20 MG PO TABS: 20.0000 mg | ORAL_TABLET | Freq: Every day | ORAL | Status: DC

## 2014-12-14 ENCOUNTER — Telehealth: Payer: Self-pay | Admitting: *Deleted

## 2014-12-14 NOTE — Telephone Encounter (Signed)
Pt calling stating he needs [place] to fax over orders or a letter for Atorvastatin 20 mg for he lives in [facility].  Without it he can't start this medication. Fax: [phone]  Attn: [name]  Just needs to say what the medication is with the doctors signature on it.  Please send so he can start medication.

## 2014-12-15 NOTE — Telephone Encounter (Signed)
S/w pt who states he resides at [facility] and he needs letter from Dr. Kirke Corin stating he has been prescribed atorvastatin. Atorvastatin 20mg  qd prescribed after recent labs. Letter written and faxed per pt request to [phone] Attn: [phone]

## 2014-12-15 NOTE — Telephone Encounter (Signed)
S/w pt who states he resides at [facility] and he needs letter from Dr. Kirke Corin stating he has been prescribed atorvastatin. Atorvastatin 20mg  qd was pres

## 2015-01-28 ENCOUNTER — Other Ambulatory Visit: Payer: Medicare Other

## 2015-02-23 ENCOUNTER — Ambulatory Visit (INDEPENDENT_AMBULATORY_CARE_PROVIDER_SITE_OTHER): Payer: Medicare Other | Admitting: Cardiovascular Disease

## 2015-02-23 ENCOUNTER — Encounter: Payer: Self-pay | Admitting: Cardiovascular Disease

## 2015-02-23 VITALS — BP 100/58 | HR 108 | Ht 74.0 in | Wt 168.2 lb

## 2015-02-23 DIAGNOSIS — R000 Tachycardia, unspecified: Secondary | ICD-10-CM

## 2015-02-23 DIAGNOSIS — R0602 Shortness of breath: Secondary | ICD-10-CM | POA: Diagnosis not present

## 2015-02-23 DIAGNOSIS — F17200 Nicotine dependence, unspecified, uncomplicated: Secondary | ICD-10-CM

## 2015-02-23 DIAGNOSIS — E785 Hyperlipidemia, unspecified: Secondary | ICD-10-CM

## 2015-02-23 DIAGNOSIS — I25118 Atherosclerotic heart disease of native coronary artery with other forms of angina pectoris: Secondary | ICD-10-CM

## 2015-02-23 MED ORDER — METOPROLOL SUCCINATE ER 25 MG PO TB24: 25.0000 mg | ORAL_TABLET | Freq: Every day | ORAL | Status: DC

## 2015-02-23 NOTE — Progress Notes (Signed)
HPI Comments: 60 yo male patient of Dr. Mariah Milling who is here today for a follow-up visit.  He has known history of CAD, PCI of the LAD in 2010, repeat cath in 03/2009, hyperlipidemia, HTN, psychiatric hx, long smoking hx who continues to smoke , previous  treatment for MRSA and prior stenting to his left subclavian artery .  History  2 back surgeries. He continues to have back pain.  He has a h/o of significant esophageal pathology and spasm with erosive gastritis/GERD per his report.  He is known to have lower blood pressure in the left arm. Carotid Doppler in August 2016 showed mild nonsignificant bilateral carotid disease with no evidence of subclavian artery stenosis. Echocardiogram was done in 08/2014  for a cardiac murmur which showed normal LV systolic function with no significant valvular abnormalities. He reported some episodes of chest pain during his last visit. He was recovering from neck surgery and thus a stress test was not ordered.   he was started on atorvastatin for hyperlipidemia. He reports improvement in chest pain but his biggest issue seems to be palpitations and tachycardia. He used to be on a beta blocker in the past helped significantly. He is back to smoking one pack per day due to stress according to him.   Allergies  Allergen Reactions  . Acetaminophen     Other reaction(s): Other (See Comments) Kidney failure     Current Outpatient Prescriptions on File Prior to Visit  Medication Sig Dispense Refill  . aspirin 81 MG tablet Take 81 mg by mouth daily.      Marland Kitchen atorvastatin (LIPITOR) 20 MG tablet Take 1 tablet (20 mg total) by mouth daily. 30 tablet 5  . budesonide-formoterol (SYMBICORT) 160-4.5 MCG/ACT inhaler Inhale 2 puffs into the lungs 2 (two) times daily.    . Calcium Carbonate (CALCIUM 600 PO) Take 1 tablet by mouth 2 (two) times daily.    . Cholecalciferol (VITAMIN D-3) 5000 UNITS TABS Take 1 tablet by mouth daily.    . citalopram (CELEXA) 20 MG tablet  Take 40 mg by mouth daily.     . DULoxetine (CYMBALTA) 30 MG capsule Take 30 mg by mouth 2 (two) times daily.    . furosemide (LASIX) 20 MG tablet Take 20 mg by mouth.    . Linaclotide (LINZESS) 145 MCG CAPS capsule Take 145 mcg by mouth daily.    . metFORMIN (GLUCOPHAGE) 500 MG tablet Take 500 mg by mouth 2 (two) times daily with a meal.    . nitroGLYCERIN (NITROSTAT) 0.4 MG SL tablet Place 1 tablet (0.4 mg total) under the tongue every 5 (five) minutes as needed for chest pain. 25 tablet 3  . pantoprazole (PROTONIX) 40 MG tablet Take 40 mg by mouth 2 (two) times daily.     . polyethylene glycol (MIRALAX / GLYCOLAX) packet Take 17 g by mouth daily.    . potassium chloride (K-DUR) 10 MEQ tablet Take 10 mEq by mouth 2 (two) times daily.    . promethazine (PHENERGAN) 25 MG tablet Take 25 mg by mouth 2 (two) times daily.    . Sennosides (SENNA LAX PO) Take 1 tablet by mouth 2 (two) times daily.    . tamsulosin (FLOMAX) 0.4 MG CAPS capsule Take 0.4 mg by mouth.    . tiotropium (SPIRIVA) 18 MCG inhalation capsule Place 18 mcg into inhaler and inhale daily.    . traZODone (DESYREL) 100 MG tablet Take 200 mg by mouth at bedtime.  No current facility-administered medications on file prior to visit.     Past Medical History  Diagnosis Date  . Hypertension   . Dyslipidemia   . Chronic pain syndrome   . Nutcracker esophagus   . GERD (gastroesophageal reflux disease)   . Degenerative cervical disc   . MRSA (methicillin resistant staph aureus) culture positive 2011  . CAD (coronary artery disease)     s/p PTCA and stent x2  . Hernia 2014  . Chest pain   . Rectus diastasis 07/19/2012  . Neuropathy [facility])      Past Surgical History  Procedure Laterality Date  . Coronary angioplasty with stent placement  2009    stents x2, in [place], [place]  . Tonsillectomy    . Cholecystectomy    . Foot surgery      Right  . Back surgery  2012  . Spine surgery  2012,2013  . Colonoscopy  Jan 2014     [name]  . Neck surgery       Family History  Problem Relation Age of Onset  . Family history unknown: Yes     Social History   Social History  . Marital Status: Single    Spouse Name: N/A  . Number of Children: N/A  . Years of Education: N/A   Occupational History  . Not on file.   Social History Main Topics  . Smoking status: Current Every Day Smoker -- 0.50 packs/day for 30 years  . Smokeless tobacco: Not on file  . Alcohol Use: No  . Drug Use: No  . Sexual Activity: Not on file   Other Topics Concern  . Not on file   Social History Narrative      PHYSICAL EXAM   BP 100/58 mmHg  Pulse 108  Ht 6\' 2"  (1.88 m)  Wt 168 lb 4 oz (76.318 kg)  BMI 21.59 kg/m2 Constitutional: He is oriented to person, place, and time. He appears well-developed and well-nourished. No distress.  HENT: No nasal discharge.  Head: Normocephalic and atraumatic.  Eyes: Pupils are equal and round.  No discharge. Neck: Normal range of motion. Neck supple. No JVD present. No thyromegaly present.  Cardiovascular: Tachycardic, regular rhythm, normal heart sounds. Exam reveals no gallop and no friction rub. 2/6 holosystolic murmur at the left sternal border.  Pulmonary/Chest: Effort normal and breath sounds normal. No stridor. No respiratory distress. He has no wheezes. He has no rales. He exhibits no tenderness.  Abdominal: Soft. Bowel sounds are normal. He exhibits no distension. There is no tenderness. There is no rebound and no guarding.  Musculoskeletal: Normal range of motion. He exhibits no edema and no tenderness.  Neurological: He is alert and oriented to person, place, and time. Coordination normal.  Skin: Skin is warm and dry. No rash noted. He is not diaphoretic. No erythema. No pallor.  Psychiatric: He has a normal mood and affect. His behavior is normal. Judgment and thought content normal.  Radial pulse is weak on the left side and normal on the right side     EKG: Sinus  tachycardia - Lateral T wave changes suggestive of ischemia  ABNORMAL    ASSESSMENT AND PLAN

## 2015-02-23 NOTE — Assessment & Plan Note (Signed)
I again discussed with him the importance of smoking cessation. 

## 2015-02-23 NOTE — Assessment & Plan Note (Signed)
He has known history of coronary artery disease. His chest pain is overall atypical and some of his symptoms might be related to tachycardia. He used to feel better when he was on a small dose beta blocker. I elected to start him on small dose Toprol. We have to watch him carefully given that his blood pressure is somewhat on the low side. A stress test can be considered in the future but it seems that his chest pain has improved from before.

## 2015-02-23 NOTE — Patient Instructions (Signed)
Medication Instructions:  Your physician has recommended you make the following change in your medication:  START taking metoprolol 25mg  daily   Labwork: none  Testing/Procedures: none  Follow-Up: Your physician wants you to follow-up in: six months with Dr. .  You will receive a reminder letter in the mail two months in advance. If you don't receive a letter, please call our office to schedule the follow-up appointment.   Any Other Special Instructions Will Be Listed Below (If Applicable).     If you need a refill on your cardiac medications before your next appointment, please call your pharmacy.

## 2015-02-23 NOTE — Assessment & Plan Note (Signed)
Continue treatment with atorvastatin with a target LDL of less than 70. 

## 2015-03-04 ENCOUNTER — Emergency Department
Admission: EM | Admit: 2015-03-04 | Discharge: 2015-03-04 | Disposition: A | Discharge status: Home / Self Care | Payer: Medicare Other | Attending: Emergency Medicine | Admitting: Emergency Medicine

## 2015-03-04 ENCOUNTER — Emergency Department: Payer: Medicare Other

## 2015-03-04 ENCOUNTER — Encounter: Payer: Self-pay | Admitting: Emergency Medicine

## 2015-03-04 DIAGNOSIS — M25511 Pain in right shoulder: Secondary | ICD-10-CM | POA: Insufficient documentation

## 2015-03-04 DIAGNOSIS — Z79899 Other long term (current) drug therapy: Secondary | ICD-10-CM | POA: Insufficient documentation

## 2015-03-04 DIAGNOSIS — R51 Headache: Secondary | ICD-10-CM | POA: Diagnosis not present

## 2015-03-04 DIAGNOSIS — I10 Essential (primary) hypertension: Secondary | ICD-10-CM | POA: Insufficient documentation

## 2015-03-04 DIAGNOSIS — Z7951 Long term (current) use of inhaled steroids: Secondary | ICD-10-CM | POA: Insufficient documentation

## 2015-03-04 DIAGNOSIS — M436 Torticollis: Secondary | ICD-10-CM | POA: Insufficient documentation

## 2015-03-04 DIAGNOSIS — M542 Cervicalgia: Secondary | ICD-10-CM | POA: Diagnosis present

## 2015-03-04 DIAGNOSIS — Z7984 Long term (current) use of oral hypoglycemic drugs: Secondary | ICD-10-CM | POA: Diagnosis not present

## 2015-03-04 DIAGNOSIS — F17200 Nicotine dependence, unspecified, uncomplicated: Secondary | ICD-10-CM | POA: Insufficient documentation

## 2015-03-04 DIAGNOSIS — G8929 Other chronic pain: Secondary | ICD-10-CM | POA: Diagnosis not present

## 2015-03-04 DIAGNOSIS — Z79891 Long term (current) use of opiate analgesic: Secondary | ICD-10-CM | POA: Diagnosis not present

## 2015-03-04 DIAGNOSIS — Z7982 Long term (current) use of aspirin: Secondary | ICD-10-CM | POA: Diagnosis not present

## 2015-03-04 LAB — COMPREHENSIVE METABOLIC PANEL
ALT: 13 U/L — ABNORMAL LOW (ref 17–63)
AST: 16 U/L (ref 15–41)
Albumin: 4 g/dL (ref 3.5–5.0)
Alkaline Phosphatase: 70 U/L (ref 38–126)
Anion gap: 8 (ref 5–15)
BUN: 6 mg/dL (ref 6–20)
CO2: 32 mmol/L (ref 22–32)
Calcium: 9.4 mg/dL (ref 8.9–10.3)
Chloride: 98 mmol/L — ABNORMAL LOW (ref 101–111)
Creatinine, Ser: 1.25 mg/dL — ABNORMAL HIGH (ref 0.61–1.24)
GFR calc Af Amer: >60 mL/min (ref >60)
GFR calc non Af Amer: >60 mL/min (ref >60)
Glucose, Bld: 126 mg/dL — ABNORMAL HIGH (ref 65–99)
Potassium: 4.2 mmol/L (ref 3.5–5.1)
Sodium: 138 mmol/L (ref 135–145)
Total Bilirubin: 0.6 mg/dL (ref 0.3–1.2)
Total Protein: 6.9 g/dL (ref 6.5–8.1)

## 2015-03-04 LAB — CBC WITH DIFFERENTIAL/PLATELET
Basophils Absolute: 0.1 10*3/uL (ref 0–0.1)
Basophils Relative: 1 %
Eosinophils Absolute: 0.1 10*3/uL (ref 0–0.7)
Eosinophils Relative: 1 %
HCT: 43.4 % (ref 40.0–52.0)
Hemoglobin: 14.7 g/dL (ref 13.0–18.0)
Lymphocytes Relative: 20 %
Lymphs Abs: 1.8 10*3/uL (ref 1.0–3.6)
MCH: 32.1 pg (ref 26.0–34.0)
MCHC: 34 g/dL (ref 32.0–36.0)
MCV: 94.4 fL (ref 80.0–100.0)
Monocytes Absolute: 0.5 10*3/uL (ref 0.2–1.0)
Monocytes Relative: 5 %
Neutro Abs: 6.7 10*3/uL — ABNORMAL HIGH (ref 1.4–6.5)
Neutrophils Relative %: 73 %
Platelets: 196 10*3/uL (ref 150–440)
RBC: 4.6 MIL/uL (ref 4.40–5.90)
RDW: 13.5 % (ref 11.5–14.5)
WBC: 9.2 10*3/uL (ref 3.8–10.6)

## 2015-03-04 LAB — RAPID INFLUENZA A&B ANTIGENS
Influenza A (ARMC): NOT DETECTED
Influenza B (ARMC): NOT DETECTED

## 2015-03-04 LAB — PROTIME-INR
INR: 1
Prothrombin Time: 13.4 seconds (ref 11.4–15.0)

## 2015-03-04 MED ORDER — METHYLPREDNISOLONE SODIUM SUCC 125 MG IJ SOLR
125.0000 mg | Freq: Once | INTRAMUSCULAR | Status: AC
  Administered 2015-03-04: 125 mg via INTRAVENOUS
  Filled 2015-03-04: qty 2

## 2015-03-04 MED ORDER — ONDANSETRON HCL 4 MG/2ML IJ SOLN
4.0000 mg | Freq: Once | INTRAMUSCULAR | Status: AC
  Administered 2015-03-04: 4 mg via INTRAVENOUS
  Filled 2015-03-04: qty 2

## 2015-03-04 MED ORDER — SODIUM CHLORIDE 0.9 % IV BOLUS (SEPSIS)
1000.0000 mL | Freq: Once | INTRAVENOUS | Status: AC
  Administered 2015-03-04: 1000 mL via INTRAVENOUS

## 2015-03-04 MED ORDER — HYDROMORPHONE HCL 1 MG/ML IJ SOLN: 1.0000 mg | Freq: Once | INTRAMUSCULAR | Status: DC

## 2015-03-04 MED ORDER — MORPHINE SULFATE (PF) 4 MG/ML IV SOLN: 4.0000 mg | Freq: Once | INTRAVENOUS | Status: DC

## 2015-03-04 MED ORDER — HYDROMORPHONE HCL 1 MG/ML IJ SOLN
1.0000 mg | Freq: Once | INTRAMUSCULAR | Status: AC
  Administered 2015-03-04: 1 mg via INTRAVENOUS
  Filled 2015-03-04: qty 1

## 2015-03-04 NOTE — ED Notes (Signed)
MD Mcshane at bedside. 

## 2015-03-04 NOTE — ED Provider Notes (Addendum)
[facility] Emergency Department Provider Note  ____________________________________________   I have reviewed the triage vital signs and the nursing notes.   HISTORY  Chief Complaint Migraine    HPI Gregory Hall is a 60 y.o. male with a history of chronic pain, goes to a pain clinic and has a pain contract who states that he is having a flare of his chronic neck pain. Patient has had pain in his neck for years. He sometimes has muscle spasm in this area. He states that this is similar to a bursitis he once had but that was in the shoulder and this is in his trapezius muscle. States the pain is only up towards his head. He denies any fever or chills or change in neuro status. No focal numbness or weakness. Did not fall did not hit his head. States that the headache is mostly a muscle pain from his neck which is chronic. He states while I cannot give him a prescription for narcotic pain medication I can give him narcotic pain medication for this. He is allergic states, to NSAIDs.  Past Medical History  Diagnosis Date  . Hypertension   . Dyslipidemia   . Chronic pain syndrome   . Nutcracker esophagus   . GERD (gastroesophageal reflux disease)   . Degenerative cervical disc   . MRSA (methicillin resistant staph aureus) culture positive 2011  . CAD (coronary artery disease)     s/p PTCA and stent x2  . Hernia 2014  . Chest pain   . Rectus diastasis 07/19/2012  . Neuropathy [facility])     Patient Active Problem List   Diagnosis Date Noted  . Subclavian artery stenosis, left 08/20/2014  . Rectus diastasis 07/19/2012  . Hernia   . Edema 05/10/2011  . Preoperative cardiovascular examination 10/06/2010  . SMOKER 11/25/2009  . CAROTID BRUIT, RIGHT 11/24/2009  . CHEST PAIN-UNSPECIFIED 11/24/2009  . Hyperlipidemia 03/30/2009  . Coronary atherosclerosis 03/30/2009  . HYPERTENSION, HX OF 03/30/2009    Past Surgical History  Procedure Laterality Date  .  Coronary angioplasty with stent placement  2009    stents x2, in [place], [place]  . Tonsillectomy    . Cholecystectomy    . Foot surgery      Right  . Back surgery  2012  . Spine surgery  2012,2013  . Colonoscopy  Jan 2014    [name]  . Neck surgery      Current Outpatient Rx  Name  Route  Sig  Dispense  Refill  . aspirin 81 MG tablet   Oral   Take 81 mg by mouth daily.           Marland Kitchen atorvastatin (LIPITOR) 20 MG tablet   Oral   Take 1 tablet (20 mg total) by mouth daily.   30 tablet   5   . budesonide-formoterol (SYMBICORT) 160-4.5 MCG/ACT inhaler   Inhalation   Inhale 2 puffs into the lungs 2 (two) times daily.         . Calcium Carbonate (CALCIUM 600 PO)   Oral   Take 1 tablet by mouth 2 (two) times daily.         . Cholecalciferol (VITAMIN D-3) 5000 UNITS TABS   Oral   Take 1 tablet by mouth daily.         . citalopram (CELEXA) 20 MG tablet   Oral   Take 40 mg by mouth daily.          . diazepam (VALIUM)  5 MG tablet   Oral   Take 5 mg by mouth 2 (two) times daily.         . DULoxetine (CYMBALTA) 30 MG capsule   Oral   Take 30 mg by mouth 2 (two) times daily.         . furosemide (LASIX) 20 MG tablet   Oral   Take 20 mg by mouth.         . Linaclotide (LINZESS) 145 MCG CAPS capsule   Oral   Take 145 mcg by mouth daily.         . metFORMIN (GLUCOPHAGE) 500 MG tablet   Oral   Take 500 mg by mouth 2 (two) times daily with a meal.         . metoprolol succinate (TOPROL-XL) 25 MG 24 hr tablet   Oral   Take 1 tablet (25 mg total) by mouth daily. Take with or immediately following a meal.   30 tablet   5   . nitroGLYCERIN (NITROSTAT) 0.4 MG SL tablet   Sublingual   Place 1 tablet (0.4 mg total) under the tongue every 5 (five) minutes as needed for chest pain.   25 tablet   3   . oxycodone (ROXICODONE) 30 MG immediate release tablet   Oral   Take 30 mg by mouth 5 (five) times daily.         . pantoprazole (PROTONIX) 40 MG  tablet   Oral   Take 40 mg by mouth 2 (two) times daily.          . polyethylene glycol (MIRALAX / GLYCOLAX) packet   Oral   Take 17 g by mouth daily.         . potassium chloride (K-DUR) 10 MEQ tablet   Oral   Take 10 mEq by mouth 2 (two) times daily.         . promethazine (PHENERGAN) 25 MG tablet   Oral   Take 25 mg by mouth 2 (two) times daily.         . Sennosides (SENNA LAX PO)   Oral   Take 1 tablet by mouth 2 (two) times daily.         . tamsulosin (FLOMAX) 0.4 MG CAPS capsule   Oral   Take 0.4 mg by mouth.         . tiotropium (SPIRIVA) 18 MCG inhalation capsule   Inhalation   Place 18 mcg into inhaler and inhale daily.         . traZODone (DESYREL) 100 MG tablet   Oral   Take 200 mg by mouth at bedtime.           Allergies Acetaminophen and Nsaids  Family History  Problem Relation Age of Onset  . Family history unknown: Yes    Social History Social History  Substance Use Topics  . Smoking status: Current Every Day Smoker -- 0.50 packs/day for 30 years  . Smokeless tobacco: None  . Alcohol Use: No    Review of Systems Constitutional: No fever/chills Eyes: No visual changes. ENT: No sore throat. See history of present illness  Cardiovascular: Denies chest pain. Respiratory: Denies shortness of breath. Gastrointestinal:   no vomiting.  No diarrhea.  No constipation. Genitourinary: Negative for dysuria. Musculoskeletal: Negative lower extremity swelling Skin: Negative for rash. Neurological: See history of present illness regarding headache no, focal weakness or numbness. 10-point ROS otherwise negative.  ____________________________________________   PHYSICAL EXAM:  VITAL SIGNS: ED  Triage Vitals  Enc Vitals Group     BP 03/04/15 1452 113/61 mmHg     Pulse Rate 03/04/15 1452 75     Resp 03/04/15 1452 18     Temp 03/04/15 1452 99.8 F (37.7 C)     Temp Source 03/04/15 1452 Oral     SpO2 03/04/15 1452 92 %     Weight  03/04/15 1452 172 lb (78.019 kg)     Height 03/04/15 1452 6\' 1"  (1.854 m)     Head Cir --      Peak Flow --      Pain Score 03/04/15 1501 9     Pain Loc --      Pain Edu? --      Excl. in GC? --     Constitutional: Alert and oriented. Well appearing and in no acute distress. Eyes: Conjunctivae are normal. PERRL. EOMI. Head: Atraumatic. Nose: No congestion/rhinnorhea. Mouth/Throat: Mucous membranes are moist.  Oropharynx non-erythematous. Neck: No stridor.   There is tenderness to palpation in the right trapezius muscle up towards its insertion in the occipital region. Review outpatient of this muscle on the right reproduces his pain. Also it hurts if he looks to the right. Left trapezius muscles normal. No meningismus. No erythema or swelling noted. with no meningismus Cardiovascular: Normal rate, regular rhythm. Grossly normal heart sounds.  Good peripheral circulation. Respiratory: Normal respiratory effort.  No retractions. Lungs CTAB. Abdominal: Soft and nontender. No distention. No guarding no rebound Back:  There is no focal tenderness or step off there is no midline tenderness there are no lesions noted. there is no CVA tenderness Musculoskeletal: No lower extremity tenderness. No joint effusions, no DVT signs strong distal pulses no edema Neurologic:  Normal speech and language. No gross focal neurologic deficits are appreciated.  Skin:  Skin is warm, dry and intact. No rash noted. Psychiatric: Mood and affect are normal. Speech and behavior are normal.  ____________________________________________   LABS (all labs ordered are listed, but only abnormal results are displayed)  Labs Reviewed  CBC WITH DIFFERENTIAL/PLATELET - Abnormal; Notable for the following:    Neutro Abs 6.7 (*)    All other components within normal limits  RAPID INFLUENZA A&B ANTIGENS ([facility] ONLY)  PROTIME-INR  COMPREHENSIVE METABOLIC PANEL   ____________________________________________  EKG  I  personally interpreted any EKGs ordered by me or triage  ____________________________________________  RADIOLOGY  I reviewed any imaging ordered by me or triage that were performed during my shift ____________________________________________   PROCEDURES  Procedure(s) performed: None  Critical Care performed: None  ____________________________________________   INITIAL IMPRESSION / ASSESSMENT AND PLAN / ED COURSE  Pertinent labs & imaging results that were available during my care of the patient were reviewed by me and considered in my medical decision making (see chart for details).  Patient with trapezius muscle pain which he states is going up towards his head. He has chronic narcotic issues and is requesting stronger narcotics for this what appears to be chronic pain. As he is also complaining of some degree of headache and obtain a CT scan of the head although have low suspicion for bleed or or mass or meningitis. We will check basic blood work, and reassess. He is neurologically intact with an NIH stroke scale of 0.  ----------------------------------------- 7:36 PM on 03/04/2015 -----------------------------------------  H and states his pain is nearly gone. He is requesting steroids as this sometimes help him when he has inflammation of his neck. No evidence of  infection. We will give him a single dose of steroids here just a chronic steroids are indicated. He is watching TV using his right arm to change channels and is in no acute distress. He is comfortable with discharge this time. ____________________________________________   FINAL CLINICAL IMPRESSION(S) / ED DIAGNOSES  Final diagnoses:  None      This chart was dictated using voice recognition software.  Despite best efforts to proofread,  errors can occur which can change meaning.     Jeanmarie Plant, MD 03/04/15 3007  Jeanmarie Plant, MD 03/04/15 [id number]

## 2015-03-04 NOTE — ED Notes (Signed)
Report given to Thurston Hole, RN at [facility].  Copy of discharge instructions faxed to facility and [facility] Adas called for patient transportation.

## 2015-03-04 NOTE — ED Notes (Signed)
States he has in the past gotten bursitis attacks that cause him to have a back headache and he was given steriod shot

## 2015-03-04 NOTE — Discharge Instructions (Signed)
Acute Torticollis °Torticollis is a condition in which the muscles of the neck tighten (contract) abnormally, causing the neck to twist and the head to move into an unnatural position. Torticollis that develops suddenly is called acute torticollis. If torticollis becomes chronic and is left untreated, the face and neck can become deformed. °CAUSES °This condition may be caused by: °· Sleeping in an awkward position (common). °· Extending or twisting the neck muscles beyond their normal position. °· Infection. °In some cases, the cause may not be known. °SYMPTOMS °Symptoms of this condition include: °· An unnatural position of the head. °· Neck pain. °· A limited ability to move the neck. °· Twisting of the neck to one side. °DIAGNOSIS °This condition is diagnosed with a physical exam. You may also have imaging tests, such as an X-ray, CT scan, or MRI. °TREATMENT °Treatment for this condition involves trying to relax the neck muscles. It may include: °· Medicines or shots. °· Physical therapy. °· Surgery. This may be done in severe cases. °HOME CARE INSTRUCTIONS °· Take medicines only as directed by your health care provider. °· Do stretching exercises and massage your neck as directed by your health care provider. °· Keep all follow-up visits as directed by your health care provider. This is important. °SEEK MEDICAL CARE IF: °· You develop a fever. °SEEK IMMEDIATE MEDICAL CARE IF: °· You develop difficulty breathing. °· You develop noisy breathing (stridor). °· You start drooling. °· You have trouble swallowing or have pain with swallowing. °· You develop numbness or weakness in your hands or feet. °· You have changes in your speech, understanding, or vision. °· Your pain gets worse. °  °This information is not intended to replace advice given to you by your health care provider. Make sure you discuss any questions you have with your health care provider. °  °Document Released: 12/24/1999 Document Revised:  05/12/2014 Document Reviewed: 12/22/2013 °Elsevier Interactive Patient Education ©2016 [employer] ° °

## 2015-03-04 NOTE — ED Notes (Signed)
Patient transported to CT 

## 2015-05-04 ENCOUNTER — Emergency Department: Payer: Medicare Other

## 2015-05-04 ENCOUNTER — Encounter: Payer: Self-pay | Admitting: *Deleted

## 2015-05-04 ENCOUNTER — Other Ambulatory Visit: Payer: Self-pay

## 2015-05-04 ENCOUNTER — Emergency Department
Admission: EM | Admit: 2015-05-04 | Discharge: 2015-05-05 | Disposition: A | Discharge status: Home / Self Care | Payer: Medicare Other | Attending: Emergency Medicine | Admitting: Emergency Medicine

## 2015-05-04 DIAGNOSIS — I2510 Atherosclerotic heart disease of native coronary artery without angina pectoris: Secondary | ICD-10-CM | POA: Insufficient documentation

## 2015-05-04 DIAGNOSIS — Z79899 Other long term (current) drug therapy: Secondary | ICD-10-CM | POA: Diagnosis not present

## 2015-05-04 DIAGNOSIS — Z7982 Long term (current) use of aspirin: Secondary | ICD-10-CM | POA: Diagnosis not present

## 2015-05-04 DIAGNOSIS — Z7984 Long term (current) use of oral hypoglycemic drugs: Secondary | ICD-10-CM | POA: Diagnosis not present

## 2015-05-04 DIAGNOSIS — J441 Chronic obstructive pulmonary disease with (acute) exacerbation: Secondary | ICD-10-CM | POA: Insufficient documentation

## 2015-05-04 DIAGNOSIS — F17200 Nicotine dependence, unspecified, uncomplicated: Secondary | ICD-10-CM | POA: Insufficient documentation

## 2015-05-04 DIAGNOSIS — R05 Cough: Secondary | ICD-10-CM | POA: Diagnosis present

## 2015-05-04 DIAGNOSIS — I10 Essential (primary) hypertension: Secondary | ICD-10-CM | POA: Insufficient documentation

## 2015-05-04 DIAGNOSIS — R079 Chest pain, unspecified: Secondary | ICD-10-CM | POA: Diagnosis not present

## 2015-05-04 LAB — CBC WITH DIFFERENTIAL/PLATELET
Basophils Absolute: 0.1 10*3/uL (ref 0–0.1)
Basophils Relative: 1 %
Eosinophils Absolute: 0.2 10*3/uL (ref 0–0.7)
Eosinophils Relative: 3 %
HCT: 38.2 % — ABNORMAL LOW (ref 40.0–52.0)
Hemoglobin: 13.2 g/dL (ref 13.0–18.0)
Lymphocytes Relative: 17 %
Lymphs Abs: 1.4 10*3/uL (ref 1.0–3.6)
MCH: 32 pg (ref 26.0–34.0)
MCHC: 34.5 g/dL (ref 32.0–36.0)
MCV: 92.8 fL (ref 80.0–100.0)
Monocytes Absolute: 1.3 10*3/uL — ABNORMAL HIGH (ref 0.2–1.0)
Monocytes Relative: 16 %
Neutro Abs: 5.4 10*3/uL (ref 1.4–6.5)
Neutrophils Relative %: 63 %
Platelets: 217 10*3/uL (ref 150–440)
RBC: 4.11 MIL/uL — ABNORMAL LOW (ref 4.40–5.90)
RDW: 13.8 % (ref 11.5–14.5)
WBC: 8.4 10*3/uL (ref 3.8–10.6)

## 2015-05-04 LAB — BASIC METABOLIC PANEL
Anion gap: 6 (ref 5–15)
BUN: 8 mg/dL (ref 6–20)
CO2: 33 mmol/L — ABNORMAL HIGH (ref 22–32)
Calcium: 8.8 mg/dL — ABNORMAL LOW (ref 8.9–10.3)
Chloride: 98 mmol/L — ABNORMAL LOW (ref 101–111)
Creatinine, Ser: 1.09 mg/dL (ref 0.61–1.24)
GFR calc Af Amer: >60 mL/min (ref >60)
GFR calc non Af Amer: >60 mL/min (ref >60)
Glucose, Bld: 111 mg/dL — ABNORMAL HIGH (ref 65–99)
Potassium: 3.8 mmol/L (ref 3.5–5.1)
Sodium: 137 mmol/L (ref 135–145)

## 2015-05-04 LAB — TROPONIN I
Troponin I: <0.03 ng/mL (ref <0.031)
Troponin I: <0.03 ng/mL (ref <0.031)

## 2015-05-04 MED ORDER — NITROGLYCERIN 0.4 MG SL SUBL
0.4000 mg | SUBLINGUAL_TABLET | SUBLINGUAL | Status: DC | PRN
  Filled 2015-05-04: qty 1

## 2015-05-04 MED ORDER — DOXYCYCLINE HYCLATE 100 MG PO CAPS: 100.0000 mg | ORAL_CAPSULE | Freq: Two times a day (BID) | ORAL | Status: DC

## 2015-05-04 MED ORDER — ASPIRIN 81 MG PO CHEW
243.0000 mg | CHEWABLE_TABLET | Freq: Once | ORAL | Status: AC
  Administered 2015-05-04: 243 mg via ORAL
  Filled 2015-05-04: qty 3

## 2015-05-04 MED ORDER — DOXYCYCLINE HYCLATE 100 MG PO TABS
100.0000 mg | ORAL_TABLET | Freq: Once | ORAL | Status: AC
  Administered 2015-05-04: 100 mg via ORAL
  Filled 2015-05-04: qty 1

## 2015-05-04 MED ORDER — OXYCODONE HCL 5 MG PO TABS
30.0000 mg | ORAL_TABLET | Freq: Once | ORAL | Status: AC
  Administered 2015-05-04: 30 mg via ORAL
  Filled 2015-05-04: qty 6

## 2015-05-04 MED ORDER — METHYLPREDNISOLONE SODIUM SUCC 125 MG IJ SOLR
125.0000 mg | Freq: Once | INTRAMUSCULAR | Status: AC
  Administered 2015-05-04: 125 mg via INTRAVENOUS
  Filled 2015-05-04: qty 2

## 2015-05-04 MED ORDER — IPRATROPIUM-ALBUTEROL 0.5-2.5 (3) MG/3ML IN SOLN
9.0000 mL | Freq: Once | RESPIRATORY_TRACT | Status: AC
  Administered 2015-05-04: 9 mL via RESPIRATORY_TRACT
  Filled 2015-05-04: qty 9

## 2015-05-04 MED ORDER — PREDNISONE 20 MG PO TABS: 60.0000 mg | ORAL_TABLET | Freq: Every day | ORAL | Status: DC

## 2015-05-04 MED ORDER — ONDANSETRON HCL 4 MG/2ML IJ SOLN
4.0000 mg | Freq: Once | INTRAMUSCULAR | Status: AC
  Administered 2015-05-04: 4 mg via INTRAVENOUS
  Filled 2015-05-04: qty 2

## 2015-05-04 MED ORDER — MORPHINE SULFATE (PF) 4 MG/ML IV SOLN
4.0000 mg | Freq: Once | INTRAVENOUS | Status: AC
  Administered 2015-05-04: 4 mg via INTRAVENOUS
  Filled 2015-05-04: qty 1

## 2015-05-04 MED ORDER — ALBUTEROL SULFATE (2.5 MG/3ML) 0.083% IN NEBU
7.5000 mg | INHALATION_SOLUTION | Freq: Once | RESPIRATORY_TRACT | Status: DC
  Filled 2015-05-04: qty 9

## 2015-05-04 NOTE — ED Provider Notes (Signed)
[facility] Emergency Department Provider Note  ____________________________________________  Time seen: Seen upon arrival to the emergency department  I have reviewed the triage vital signs and the nursing notes.   HISTORY  Chief Complaint Chest Pain and Cough   HPI Gregory Hall is a 60 y.o. male with a history of COPD as well as CAD status post stenting on a baby aspirin per day who is presenting to the emergency department with 5 days of productive cough and 1 day of chest pain. He says that the cough has been increasingly frequent and productive of green sputum. He says that he is also having left-sided chest pain at this time which feels like pressure and an 8 out of 10 which has been ongoing for about the past 2 hours. He is also feeling nauseous. Says that the pain radiates into his left arm. Says that the pain is also worsened with coughing and deep breathing. Patient does not wear home oxygen. Does smoke.   Past Medical History  Diagnosis Date  . Hypertension   . Dyslipidemia   . Chronic pain syndrome   . Nutcracker esophagus   . GERD (gastroesophageal reflux disease)   . Degenerative cervical disc   . MRSA (methicillin resistant staph aureus) culture positive 2011  . CAD (coronary artery disease)     s/p PTCA and stent x2  . Hernia 2014  . Chest pain   . Rectus diastasis 07/19/2012  . Neuropathy [facility])     Patient Active Problem List   Diagnosis Date Noted  . Subclavian artery stenosis, left 08/20/2014  . Rectus diastasis 07/19/2012  . Hernia   . Edema 05/10/2011  . Preoperative cardiovascular examination 10/06/2010  . SMOKER 11/25/2009  . CAROTID BRUIT, RIGHT 11/24/2009  . CHEST PAIN-UNSPECIFIED 11/24/2009  . Hyperlipidemia 03/30/2009  . Coronary atherosclerosis 03/30/2009  . HYPERTENSION, HX OF 03/30/2009    Past Surgical History  Procedure Laterality Date  . Coronary angioplasty with stent placement  2009    stents x2,  in [place], [place]  . Tonsillectomy    . Cholecystectomy    . Foot surgery      Right  . Back surgery  2012  . Spine surgery  2012,2013  . Colonoscopy  Jan 2014    [name]  . Neck surgery      Current Outpatient Rx  Name  Route  Sig  Dispense  Refill  . aspirin EC 81 MG tablet   Oral   Take 81 mg by mouth daily.         Marland Kitchen atorvastatin (LIPITOR) 20 MG tablet   Oral   Take 1 tablet (20 mg total) by mouth daily.   30 tablet   5   . budesonide-formoterol (SYMBICORT) 160-4.5 MCG/ACT inhaler   Inhalation   Inhale 2 puffs into the lungs 2 (two) times daily.         . busPIRone (BUSPAR) 10 MG tablet   Oral   Take 10 mg by mouth 2 (two) times daily.         . calcium carbonate (OSCAL) 1500 (600 Ca) MG TABS tablet   Oral   Take 600 mg of elemental calcium by mouth 2 (two) times daily with a meal.         . Cholecalciferol (VITAMIN D3) 5000 units CAPS   Oral   Take 5,000 Units by mouth daily.         . citalopram (CELEXA) 40 MG tablet   Oral  Take 40 mg by mouth daily.         . diazepam (VALIUM) 5 MG tablet   Oral   Take 5 mg by mouth 2 (two) times daily.         . furosemide (LASIX) 20 MG tablet   Oral   Take 20 mg by mouth daily.          . Linaclotide (LINZESS) 145 MCG CAPS capsule   Oral   Take 145 mcg by mouth daily.         . metFORMIN (GLUCOPHAGE) 500 MG tablet   Oral   Take 500 mg by mouth 2 (two) times daily before a meal.          . metoprolol succinate (TOPROL-XL) 25 MG 24 hr tablet   Oral   Take 1 tablet (25 mg total) by mouth daily. Take with or immediately following a meal.   30 tablet   5   . Naloxone HCl (EVZIO) 0.4 MG/0.4ML SOAJ   Injection   Inject 1 Dose as directed once as needed (for opioid overdose).         Marland Kitchen oxycodone (ROXICODONE) 30 MG immediate release tablet   Oral   Take 30 mg by mouth every 4 (four) hours as needed for pain.          . pantoprazole (PROTONIX) 40 MG tablet   Oral   Take 40 mg by mouth  2 (two) times daily.          . potassium chloride (K-DUR) 10 MEQ tablet   Oral   Take 10 mEq by mouth 2 (two) times daily.         Marland Kitchen senna-docusate (SENOKOT-S) 8.6-50 MG tablet   Oral   Take 1 tablet by mouth 2 (two) times daily.         . tamsulosin (FLOMAX) 0.4 MG CAPS capsule   Oral   Take 0.4 mg by mouth daily.          Marland Kitchen tiotropium (SPIRIVA) 18 MCG inhalation capsule   Inhalation   Place 18 mcg into inhaler and inhale daily.         . traZODone (DESYREL) 100 MG tablet   Oral   Take 200 mg by mouth at bedtime.         . nitroGLYCERIN (NITROSTAT) 0.4 MG SL tablet   Sublingual   Place 1 tablet (0.4 mg total) under the tongue every 5 (five) minutes as needed for chest pain.   25 tablet   3   . polyethylene glycol (MIRALAX / GLYCOLAX) packet   Oral   Take 17 g by mouth daily.         . promethazine (PHENERGAN) 25 MG tablet   Oral   Take 25 mg by mouth 2 (two) times daily.           Allergies Acetaminophen and Nsaids  Family History  Problem Relation Age of Onset  . Family history unknown: Yes    Social History Social History  Substance Use Topics  . Smoking status: Current Every Day Smoker -- 0.50 packs/day for 30 years  . Smokeless tobacco: None  . Alcohol Use: No    Review of Systems Constitutional: No fever/chills Eyes: No visual changes. ENT: No sore throat. Cardiovascular:As above Respiratory: As above Gastrointestinal: No abdominal pain.  no vomiting.  No diarrhea.  No constipation. Genitourinary: Negative for dysuria. Musculoskeletal: Negative for back pain. Skin: Negative for rash. Neurological: Negative for headaches,  focal weakness or numbness.  10-point ROS otherwise negative.  ____________________________________________   PHYSICAL EXAM:  VITAL SIGNS: ED Triage Vitals  Enc Vitals Group     BP 05/04/15 1929 144/73 mmHg     Pulse Rate 05/04/15 1929 80     Resp 05/04/15 1929 18     Temp 05/04/15 1929 98.3 F  (36.8 C)     Temp src --      SpO2 05/04/15 1929 87 %     Weight 05/04/15 1929 168 lb 4 oz (76.318 kg)     Height 05/04/15 1929 6\' 2"  (1.88 m)     Head Cir --      Peak Flow --      Pain Score 05/04/15 1929 8     Pain Loc --      Pain Edu? --      Excl. in GC? --     Constitutional: Alert and oriented. Well appearing and in no acute distress. Eyes: Conjunctivae are normal. PERRL. EOMI. Head: Atraumatic. Nose: No congestion/rhinnorhea. Mouth/Throat: Mucous membranes are moist.   Neck: No stridor.   Cardiovascular: Normal rate, regular rhythm. Grossly normal heart sounds.   Respiratory: Normal respiratory effort.  No retractions. Wheezing throughout with prolonged extra phase. No tenderness palpation over the left chest. Gastrointestinal: Soft and nontender. No distention.  Musculoskeletal: No lower extremity tenderness nor edema.  No joint effusions. Neurologic:  Normal speech and language. No gross focal neurologic deficits are appreciated.  Skin:  Skin is warm, dry and intact. No rash noted. Psychiatric: Mood and affect are normal. Speech and behavior are normal.  ____________________________________________   LABS (all labs ordered are listed, but only abnormal results are displayed)  Labs Reviewed  CBC WITH DIFFERENTIAL/PLATELET - Abnormal; Notable for the following:    RBC 4.11 (*)    HCT 38.2 (*)    Monocytes Absolute 1.3 (*)    All other components within normal limits  BASIC METABOLIC PANEL - Abnormal; Notable for the following:    Chloride 98 (*)    CO2 33 (*)    Glucose, Bld 111 (*)    Calcium 8.8 (*)    All other components within normal limits  TROPONIN I  TROPONIN I   ____________________________________________  EKG  ED ECG REPORT I, Arelia Longest, the attending physician, personally viewed and interpreted this ECG.   Date: 05/04/2015  EKG Time: 1933  Rate: 79  Rhythm: normal sinus rhythm  Axis: Normal axis  Intervals:none  ST&T Change:  T wave inversions in 23 and aVF. T-wave inversions in V4 through 6. No ST elevation or depression. EKG is unchanged from 02/23/2015. ____________________________________________  RADIOLOGY  DG Chest 1 View (Final result) Result time: 05/04/15 20:02:56   Final result by Rad Results In Interface (05/04/15 20:02:56)   Narrative:   CLINICAL DATA: Patient with sudden onset of left-sided chest pressure radiating to the left arm. Productive cough and shortness of breath.  EXAM: CHEST 1 VIEW  COMPARISON: Chest radiograph 10/28/2013  FINDINGS: Monitoring leads overlie the patient. Stent material projecting over the superior mediastinum. Anterior cervical spinal fusion hardware. Stable cardiac and mediastinal contours. No consolidative pulmonary opacities. No pleural effusion or pneumothorax. Regional skeleton is unremarkable.  IMPRESSION: No acute cardiopulmonary process.   Electronically Signed By: Annia Belt M.D. On: 05/04/2015 20:02       ____________________________________________   PROCEDURES   ____________________________________________   INITIAL IMPRESSION / ASSESSMENT AND PLAN / ED COURSE  Pertinent labs & imaging results  that were available during my care of the patient were reviewed by me and considered in my medical decision making (see chart for details).  ----------------------------------------- 9:10 PM on 05/04/2015 -----------------------------------------  Patient says that his breathing feels improved. I re-auscultated his lungs and he still has wheezing which is worse to the bases at this time with a prolonged respiratory phase. He is moving better air to the upper fields. He still says he is 8 out of 10 chest pain. He had refused nitroglycerin before because he says that it drops his blood pressure too low. We'll give him a dose of morphine and Zofran. We'll also give him doxycycline. He has normal QT interval give doxycycline because he  appears to be on promethazine and I do not worse the daily prolongation. Patient with an unchanged EKG and a normal first troponin. We'll repeat the troponin. If the patient is feeling better after his second round of nebs, is not desaturating and his troponin is normal at feeling he'll be safe for discharge to home. Signed out to Dr. Mayford Knife.  FINAL CLINICAL IMPRESSION(S) / ED DIAGNOSES  COPD exacerbation. Chest pain.    Myrna Blazer, MD 05/04/15 2112

## 2015-05-04 NOTE — ED Notes (Signed)
Pt arrived to ED from [facility] after onset of left sided chest pressure that is radiating to left arm at 1630 today. Hx of 3 Heart attacks and three stints.  Pt reports taking 81 mg ASA at home today. Pt reports pain is constant but increases when coughing. Pt reports he has also had a productive cough and SOB for the past 5 days with fevers on and off as well as chills. Pt arrived stating 87% on RA. Pt does not wear home oxygen.

## 2015-05-04 NOTE — ED Notes (Signed)
Pt given can of Sprite and graham crackers to eat, per Dr Mayford Knife.

## 2015-05-05 NOTE — ED Notes (Signed)
Pt signed e signature.  D/c inst to pt.  Pt alert.  Iv d'ced.

## 2015-05-05 NOTE — ED Provider Notes (Signed)
Repeat troponin is negative, patient is stable for discharge as per previous plan of Dr. Langston Masker.  Emily Filbert, MD 05/05/15 0001

## 2015-05-06 ENCOUNTER — Telehealth: Payer: Self-pay | Admitting: Cardiovascular Disease

## 2015-05-06 NOTE — Telephone Encounter (Signed)
lmov for patient to call back and schedule Fu apt from ED  Seen for CP and cough.

## 2015-05-11 ENCOUNTER — Encounter: Admission: RE | Payer: Self-pay | Source: Ambulatory Visit

## 2015-05-11 ENCOUNTER — Ambulatory Visit: Admission: RE | Admit: 2015-05-11 | Payer: Medicare Other | Source: Ambulatory Visit | Admitting: Gastroenterology

## 2015-05-11 SURGERY — COLONOSCOPY WITH PROPOFOL
Anesthesia: General

## 2015-06-15 ENCOUNTER — Ambulatory Visit: Payer: Medicare Other | Admitting: Cardiovascular Disease

## 2015-07-26 ENCOUNTER — Ambulatory Visit (INDEPENDENT_AMBULATORY_CARE_PROVIDER_SITE_OTHER): Payer: Medicare Other | Admitting: Cardiovascular Disease

## 2015-07-26 ENCOUNTER — Encounter: Payer: Self-pay | Admitting: Cardiovascular Disease

## 2015-07-26 VITALS — BP 80/54 | HR 75 | Ht 74.0 in | Wt 156.1 lb

## 2015-07-26 DIAGNOSIS — R079 Chest pain, unspecified: Secondary | ICD-10-CM | POA: Diagnosis not present

## 2015-07-26 DIAGNOSIS — R0602 Shortness of breath: Secondary | ICD-10-CM | POA: Diagnosis not present

## 2015-07-26 DIAGNOSIS — Z01818 Encounter for other preprocedural examination: Secondary | ICD-10-CM | POA: Diagnosis not present

## 2015-07-26 MED ORDER — PREDNISONE 20 MG PO TABS: ORAL_TABLET | ORAL | Status: DC

## 2015-07-26 NOTE — Progress Notes (Signed)
Cardiology Office Note   Date:  07/26/2015   ID:  Gregory Hall, DOB 11-28-1955, MRN [id number]  PCP:  Domenic Schwab, FNP  Cardiologist:   Lorine Bears, MD   Chief Complaint  Patient presents with  . surgical clearance      History of Present Illness: Gregory Hall is a 60 y.o. male who presents for preoperative cardiovascular evaluation before colonoscopy.  He has known history of CAD, PCI of the LAD in 2010, repeat cath in 03/2009, hyperlipidemia, HTN, psychiatric hx, long smoking hx who continues to smoke , previous  treatment for MRSA and prior stenting to his left subclavian artery . History  2 back surgeries. He continues to have back pain. He has a h/o of significant esophageal pathology and spasm with erosive gastritis/GERD per his report.  He is known to have lower blood pressure in the left arm. Carotid Doppler in August 2016 showed mild nonsignificant bilateral carotid disease with no evidence of subclavian artery stenosis. Echocardiogram was done in 08/2014  for a cardiac murmur which showed normal LV systolic function with no significant valvular abnormalities. He is known to have sinus tachycardia and during his last visit in February, I resumed treatment with small dose metoprolol. He reports improvement in symptoms since then. He had to use nitroglycerin only one time. He did go to the emergency room in April for COPD exacerbation. He reported some chest pain at that time but basic cardiac workup was negative. He needs to have colonoscopy done and he was sent back for preoperative cardiovascular evaluation. He reports chronic exertional dyspnea. He continues to smoke. He also reports some wheezing recently and cough.   Past Medical History  Diagnosis Date  . Hypertension   . Dyslipidemia   . Chronic pain syndrome   . Nutcracker esophagus   . GERD (gastroesophageal reflux disease)   . Degenerative cervical disc   . MRSA (methicillin  resistant staph aureus) culture positive 2011  . CAD (coronary artery disease)     s/p PTCA and stent x2  . Hernia 2014  . Chest pain   . Rectus diastasis 07/19/2012  . Neuropathy [facility])     Past Surgical History  Procedure Laterality Date  . Coronary angioplasty with stent placement  2009    stents x2, in [place], [place]  . Tonsillectomy    . Cholecystectomy    . Foot surgery      Right  . Back surgery  2012  . Spine surgery  2012,2013  . Colonoscopy  Jan 2014    [name]  . Neck surgery       Current Outpatient Prescriptions  Medication Sig Dispense Refill  . albuterol (PROVENTIL HFA;VENTOLIN HFA) 108 (90 Base) MCG/ACT inhaler Inhale 2 puffs into the lungs every 6 (six) hours as needed for wheezing or shortness of breath.    Marland Kitchen aspirin EC 81 MG tablet Take 81 mg by mouth daily.    Marland Kitchen atorvastatin (LIPITOR) 20 MG tablet Take 1 tablet (20 mg total) by mouth daily. 30 tablet 5  . budesonide-formoterol (SYMBICORT) 160-4.5 MCG/ACT inhaler Inhale 2 puffs into the lungs 2 (two) times daily.    . busPIRone (BUSPAR) 10 MG tablet Take 10 mg by mouth 2 (two) times daily.    . calcium carbonate (OSCAL) 1500 (600 Ca) MG TABS tablet Take 600 mg of elemental calcium by mouth 2 (two) times daily with a meal.    . Cholecalciferol (VITAMIN D3) 5000 units CAPS Take 5,000 Units  by mouth daily.    . citalopram (CELEXA) 40 MG tablet Take 40 mg by mouth daily.    . diazepam (VALIUM) 5 MG tablet Take 5 mg by mouth 2 (two) times daily.    Marland Kitchen doxycycline (VIBRAMYCIN) 100 MG capsule Take 1 capsule (100 mg total) by mouth 2 (two) times daily. 14 capsule 0  . furosemide (LASIX) 20 MG tablet Take 20 mg by mouth daily.     . hydroxypropyl methylcellulose / hypromellose (ISOPTO TEARS / GONIOVISC) 2.5 % ophthalmic solution Place 1-2 drops into both eyes every 6 (six) hours as needed for dry eyes.    . Linaclotide (LINZESS) 145 MCG CAPS capsule Take 145 mcg by mouth daily.    . magnesium hydroxide (MILK OF MAGNESIA)  400 MG/5ML suspension Take 30 mLs by mouth daily as needed for mild constipation.    . metFORMIN (GLUCOPHAGE) 500 MG tablet Take 500 mg by mouth 2 (two) times daily before a meal.     . metoprolol succinate (TOPROL-XL) 25 MG 24 hr tablet Take 1 tablet (25 mg total) by mouth daily. Take with or immediately following a meal. 30 tablet 5  . Naloxone HCl (EVZIO) 0.4 MG/0.4ML SOAJ Inject 1 Dose as directed once as needed (for opioid overdose).    . nitroGLYCERIN (NITROSTAT) 0.4 MG SL tablet Place 1 tablet (0.4 mg total) under the tongue every 5 (five) minutes as needed for chest pain. 25 tablet 3  . oxycodone (ROXICODONE) 30 MG immediate release tablet Take 30 mg by mouth every 4 (four) hours as needed for pain.     . pantoprazole (PROTONIX) 40 MG tablet Take 40 mg by mouth 2 (two) times daily.     . polyethylene glycol (MIRALAX / GLYCOLAX) packet Take 17 g by mouth daily as needed for mild constipation.     . potassium chloride (K-DUR) 10 MEQ tablet Take 10 mEq by mouth 2 (two) times daily.    . predniSONE (DELTASONE) 20 MG tablet Take 40mg  daily for 3 days then take 20mg  daily for 3 days 10 tablet 0  . promethazine (PHENERGAN) 25 MG tablet Take 25 mg by mouth 2 (two) times daily as needed for nausea or vomiting.     . senna-docusate (SENOKOT-S) 8.6-50 MG tablet Take 1 tablet by mouth 2 (two) times daily.    . tamsulosin (FLOMAX) 0.4 MG CAPS capsule Take 0.4 mg by mouth daily.     tiotropium (SPIRIVA) 18 MCG inhalation capsule Place 18 mcg into inhaler and inhale daily.    . traZODone (DESYREL) 100 MG tablet Take 200 mg by mouth at bedtime.     No current facility-administered medications for this visit.    Allergies:   Acetaminophen and Nsaids    Social History:  The patient  reports that he has been smoking.  He does not have any smokeless tobacco history on file. He reports that he does not drink alcohol or use illicit drugs.   Family History:  The patient's Family history is unknown by  patient.    ROS:  Please see the history of present illness.   Otherwise, review of systems are positive for none.   All other systems are reviewed and negative.    PHYSICAL EXAM: VS:  BP 80/54 mmHg  Pulse 75  Ht 6\' 2"  (1.88 m)  Wt 156 lb 1.9 oz (70.816 kg)  BMI 20.04 kg/m2  SpO2 94% , BMI Body mass index is 20.04 kg/(m^2). GEN: Well nourished, well developed, in no  acute distress HEENT: normal Neck: no JVD, carotid bruits, or masses Cardiac: RRR; no murmurs, rubs, or gallops,no edema  Respiratory:  clear to auscultation bilaterally with diminished breath sounds bilaterally, normal work of breathing GI: soft, nontender, nondistended, + BS MS: no deformity or atrophy Skin: warm and dry, no rash Neuro:  Strength and sensation are intact Psych: euthymic mood, full affect   EKG:  EKG is ordered today. The ekg ordered today demonstrates normal sinus rhythm with T-wave changes in the anterolateral and inferior leads suggestive of ischemia.   Recent Labs: 03/04/2015: ALT 13* 05/04/2015: BUN 8; Creatinine, Ser 1.09; Hemoglobin 13.2; Platelets 217; Potassium 3.8; Sodium 137    Lipid Panel    Component Value Date/Time   CHOL 164 12/07/2014 0839   CHOL 96 08/24/2011 0110   CHOL  04/15/2009 0541    108        ATP III CLASSIFICATION:  <200     mg/dL   Desirable  829-937  mg/dL   Borderline High  >=169    mg/dL   High          TRIG 678 12/07/2014 0839   TRIG 169 08/24/2011 0110   HDL 31* 12/07/2014 0839   HDL 19* 08/24/2011 0110   HDL 25* 04/15/2009 0541   CHOLHDL 5.3* 12/07/2014 0839   CHOLHDL 4.3 04/15/2009 0541   VLDL 34 08/24/2011 0110   VLDL 18 04/15/2009 0541   LDLCALC 109* 12/07/2014 0839   LDLCALC 43 08/24/2011 0110   LDLCALC  04/15/2009 0541    65        Total Cholesterol/HDL:CHD Risk Coronary Heart Disease Risk Table                     Men   Women  1/2 Average Risk   3.4   3.3  Average Risk       5.0   4.4  2 X Average Risk   9.6   7.1  3 X Average Risk   23.4   11.0        Use the calculated Patient Ratio above and the CHD Risk Table to determine the patient's CHD Risk.        ATP III CLASSIFICATION (LDL):  <100     mg/dL   Optimal  938-101  mg/dL   Near or Above                    Optimal  130-159  mg/dL   Borderline  751-025  mg/dL   High  >852     mg/dL   Very High      Wt Readings from Last 3 Encounters:  07/26/15 156 lb 1.9 oz (70.816 kg)  05/04/15 168 lb 4 oz (76.318 kg)  03/04/15 172 lb (78.019 kg)        ASSESSMENT AND PLAN:  1.   Preoperative cardiovascular evaluation for colonoscopy: The patient has atypical chest pain and chronic exertional dyspnea with known history of coronary artery disease and no recent ischemic cardiac evaluation. Thus, I recommend a pharmacologic nuclear stress test before receding with colonoscopy. The patient is not able to exercise on a treadmill due to chronic back pain requiring narcotics.  2. Coronary artery disease involving native coronary arteries with other forms of angina: Stress test as outlined above. Symptoms overall improved with small dose Toprol.  3. Hyperlipidemia: He was started on atorvastatin last year. He gets his labs on with his primary care physician.  4. Tobacco use:: I again discussed with him the importance of smoking cessation and he reports that he is going to start using the patches in the near future.  5. COPD exacerbation: This appears to be mild but I decided to give him a short course of prednisone.  6. Left subclavian artery stenosis: Blood pressure is always lower in the left arm. I repeated his blood pressure in the right arm and it was 90/60. Thus, I made no changes in the dose of Toprol.  Disposition:   FU with me in 6 months  Signed,  Lorine Bears, MD  07/26/2015 2:49 PM    [facility]

## 2015-07-26 NOTE — Patient Instructions (Addendum)
Medication Instructions:  Your physician has recommended you make the following change in your medication:  START taking prednisone 40mg  daily for three days then 20mg  once daily for 3 days   Labwork: none  Testing/Procedures: Your physician has requested that you have a lexiscan myoview. For further information please visit . Please follow instruction sheet, as given.  [facility] MYOVIEW  Your caregiver has ordered a Stress Test with nuclear imaging. The purpose of this test is to evaluate the blood supply to your heart muscle. This procedure is referred to as a "Non-Invasive Stress Test." This is because other than having an IV started in your vein, nothing is inserted or "invades" your body. Cardiac stress tests are done to find areas of poor blood flow to the heart by determining the extent of coronary artery disease (CAD). Some patients exercise on a treadmill, which naturally increases the blood flow to your heart, while others who are  unable to walk on a treadmill due to physical limitations have a pharmacologic/chemical stress agent called Lexiscan . This medicine will mimic walking on a treadmill by temporarily increasing your coronary blood flow.   Please note: these test may take anywhere between 2-4 hours to complete  PLEASE REPORT TO [facility] MEDICAL MALL ENTRANCE  THE VOLUNTEERS AT THE FIRST DESK WILL DIRECT YOU WHERE TO GO  Date of Procedure: Thursday, July 27  Arrival Time for Procedure:___8:15am______  Instructions regarding medication:   __xx__ : Hold metformin medication morning of procedure  __x_x_:  Hold metoprolol night before procedure and morning of procedure  _xx___:  Hold other medications as follows: Lasix the morning of your test PLEASE NOTIFY THE OFFICE AT LEAST 24 HOURS IN ADVANCE IF YOU ARE UNABLE TO KEEP YOUR APPOINTMENT.  [phone] AND  PLEASE NOTIFY NUCLEAR MEDICINE AT [facility] AT LEAST 24 HOURS IN ADVANCE IF YOU ARE UNABLE TO KEEP YOUR  APPOINTMENT. [phone]  How to prepare for your Myoview test:   Do not eat or drink after midnight  No caffeine for 24 hours prior to test  No smoking 24 hours prior to test.  Your medication may be taken with water.  If your doctor stopped a medication because of this test, do not take that medication.  Ladies, please do not wear dresses.  Skirts or pants are appropriate. Please wear a short sleeve shirt.  No perfume, cologne or lotion.  Wear comfortable walking shoes. No heels!            Follow-Up: Your physician wants you to follow-up in: 6 months with Dr. [facility].  You will receive a reminder letter in the mail two months in advance. If you don't receive a letter, please call our office to schedule the follow-up appointment.   Any Other Special Instructions Will Be Listed Below (If Applicable).     If you need a refill on your cardiac medications before your next appointment, please call your pharmacy.  Cardiac Nuclear Scanning A cardiac nuclear scan is used to check your heart for problems, such as the following:  A portion of the heart is not getting enough blood.  Part of the heart muscle has died, which happens with a heart attack.  The heart wall is not working normally.  In this test, a radioactive dye (tracer) is injected into your bloodstream. After the tracer has traveled to your heart, a scanning device is used to measure how much of the tracer is absorbed by or distributed to various areas of your heart. LET YOUR  HEALTH CARE PROVIDER KNOW ABOUT:  Any allergies you have.  All medicines you are taking, including vitamins, herbs, eye drops, creams, and over-the-counter medicines.  Previous problems you or members of your family have had with the use of anesthetics.  Any blood disorders you have.  Previous surgeries you have had.  Medical conditions you have.  RISKS AND COMPLICATIONS Generally, this is a safe procedure. However, as with any  procedure, problems can occur. Possible problems include:   Serious chest pain.  Rapid heartbeat.  Sensation of warmth in your chest. This usually passes quickly. BEFORE THE PROCEDURE Ask your health care provider about changing or stopping your regular medicines. PROCEDURE This procedure is usually done at a hospital and takes 2-4 hours.  An IV tube is inserted into one of your veins.  Your health care provider will inject a small amount of radioactive tracer through the tube.  You will then wait for 20-40 minutes while the tracer travels through your bloodstream.  You will lie down on an exam table so images of your heart can be taken. Images will be taken for about 15-20 minutes.  You will exercise on a treadmill or stationary bike. While you exercise, your heart activity will be monitored with an electrocardiogram (ECG), and your blood pressure will be checked.  If you are unable to exercise, you may be given a medicine to make your heart beat faster.  When blood flow to your heart has peaked, tracer will again be injected through the IV tube.  After 20-40 minutes, you will get back on the exam table and have more images taken of your heart.  When the procedure is over, your IV tube will be removed. AFTER THE PROCEDURE  You will likely be able to leave shortly after the test. Unless your health care provider tells you otherwise, you may return to your normal schedule, including diet, activities, and medicines.  Make sure you find out how and when you will get your test results.   This information is not intended to replace advice given to you by your health care provider. Make sure you discuss any questions you have with your health care provider.   Document Released: 01/21/2004 Document Revised: 12/31/2012 Document Reviewed: 12/04/2012 Elsevier Interactive Patient Education [employer]

## 2015-07-30 ENCOUNTER — Telehealth: Payer: Self-pay | Admitting: *Deleted

## 2015-07-30 NOTE — Telephone Encounter (Signed)
Letter typed and routed to # provided.

## 2015-07-30 NOTE — Telephone Encounter (Signed)
Patient called and needs a letter stating that he will not get cardiac clearance for his colonoscopy until after his stress test. This for social services and the assisted living records. Sent to attn: Thurston Hole    Fax# [phone]

## 2015-08-02 ENCOUNTER — Telehealth: Payer: Self-pay | Admitting: Cardiovascular Disease

## 2015-08-02 NOTE — Telephone Encounter (Signed)
Pt calling stating he is having Myoview on Thursday  But is worried since he has some back issues.  He won't be able to lay down but for so long  He is not sure he is able to lay down without a pain shot. Would like some advise on this. He thinks if he gets the pain shot it might help But it will still hurt. States he did that shot last time we asked him to do this test.   Please advise.

## 2015-08-02 NOTE — Telephone Encounter (Signed)
Spoke w/ pt.  Advised him of Brittany's recommendation.  He verbalizes disappointment, as he states that he cannot take Motrin or Tylenol, though he does not state a reason for this.  He is appreciative of the call and states that he will do the best he can.

## 2015-08-02 NOTE — Telephone Encounter (Signed)
  For the [facility], he has to lay flat for 8 minutes. I spoke to Nuclear Medicine and they prefer for him to be fully cognitive aware during the stress test, which may be hindered by IV/IM pain medications. Also, this could interfere with his BP/HR response during the stress portion. Nuclear Medicine will provide pillow support during the imaging portion to make this more bearable for the patient. I recommend he take 400 mg Motrin or 650 mg Tylenol one hour prior to the testing.

## 2015-08-05 ENCOUNTER — Encounter
Admission: RE | Admit: 2015-08-05 | Discharge: 2015-08-05 | Disposition: A | Discharge status: Home / Self Care | Payer: Medicare Other | Source: Ambulatory Visit | Attending: Cardiovascular Disease | Admitting: Cardiovascular Disease

## 2015-08-05 DIAGNOSIS — R0602 Shortness of breath: Secondary | ICD-10-CM | POA: Insufficient documentation

## 2015-08-05 DIAGNOSIS — R079 Chest pain, unspecified: Secondary | ICD-10-CM | POA: Diagnosis present

## 2015-08-05 MED ORDER — TECHNETIUM TC 99M TETROFOSMIN IV KIT
30.0000 | PACK | Freq: Once | INTRAVENOUS | Status: AC | PRN
Start: 2015-08-05 — End: 2015-08-05
  Administered 2015-08-05: 31.48 via INTRAVENOUS

## 2015-08-05 MED ORDER — REGADENOSON 0.4 MG/5ML IV SOLN
0.4000 mg | Freq: Once | INTRAVENOUS | Status: AC
  Administered 2015-08-05: 0.4 mg via INTRAVENOUS
  Filled 2015-08-05: qty 5

## 2015-08-05 MED ORDER — TECHNETIUM TC 99M TETROFOSMIN IV KIT
13.6100 | PACK | Freq: Once | INTRAVENOUS | Status: AC | PRN
  Administered 2015-08-05: 13.61 via INTRAVENOUS

## 2015-08-06 ENCOUNTER — Telehealth: Payer: Self-pay | Admitting: Cardiovascular Disease

## 2015-08-06 LAB — NM MYOCAR MULTI W/SPECT W/WALL MOTION / EF
LV dias vol: 56 mL (ref 62–150)
LV sys vol: 29 mL
Peak HR: 82 {beats}/min
Percent HR: 51 %
Rest HR: 58 {beats}/min
SDS: 0
SRS: 1
SSS: 0
TID: 0.81

## 2015-08-06 NOTE — Telephone Encounter (Signed)
Called and left message that test results are not available at this time.

## 2015-08-06 NOTE — Telephone Encounter (Signed)
Patient wants results from [facility] nm test yesterday. Please call when available .

## 2015-08-11 NOTE — Telephone Encounter (Signed)
Reviewed results w/pt. See results note 

## 2015-08-19 ENCOUNTER — Telehealth: Payer: Self-pay | Admitting: Cardiovascular Disease

## 2015-08-19 NOTE — Telephone Encounter (Signed)
Gregory Hall at [facility] requests H&P and last OV notes. Faxed to [phone]

## 2015-08-19 NOTE — Telephone Encounter (Signed)
Burna Mortimer from home health calling asking if we can send over H/P last office note and a face to face   For they admitted patient but have no records on him  Please send to  [phone]

## 2015-08-24 ENCOUNTER — Telehealth: Payer: Self-pay | Admitting: Cardiovascular Disease

## 2015-08-24 NOTE — Telephone Encounter (Addendum)
Gregory Hall, nurse at [facility], called to ask for specific parameters for giving metoprolol. Pt takes metoprolol 25mg  qd for tachycardia.  At 7/17 OV w/Dr. 8/17, BP 90/60, HR 75. Pt instructed to continue taking medication.  Forward to Kirke Corin, NP, to review and advise.

## 2015-08-24 NOTE — Telephone Encounter (Addendum)
Left detailed message on Gregory Hall's, [facility] nurse, voice mail.  Pt pressure is lower in left arm than right. Suggested she check BP in right arm.  Right arm BP at 7/17 OV was 90/60. No medication changes were made.  Pt was instructed at that time to continue metoprolol 25mg  qd. Provided CB number if has any further questions.

## 2015-08-24 NOTE — Telephone Encounter (Signed)
Nurse from [facility] calling us back Also wanted to give [place] a BP reading  80/48 is the reading in patient right arm

## 2015-08-24 NOTE — Telephone Encounter (Signed)
S/w Lawanna Kobus, nurse at [facility], who requests parameters for which staff where patient resides can give metoprolol. Per Dr. Jari Sportsman 7/17 OV notes: "Blood pressure is always lower in the left arm. I repeated his blood pressure in the right arm and it was 90/60. Thus, I made no changes in the dose of Toprol.: Pt takes 25mg  qd.  Advised Angel to hold if BP < 90/60, HR <60, or if pt symptomatic. She understands if metoprolol is held,  have staff recheck vitals later in the day and give if w/in parameters. She will contact if metoprolol is consistently needing to be held. She is appreciative of the call w/no further questions at this time.

## 2015-08-24 NOTE — Telephone Encounter (Signed)
Nurse with [facility] asks if pt needs parameters for Metoprolol. Please call and advise.  Ok to leave detailed msg if she does not answer.

## 2015-10-06 ENCOUNTER — Other Ambulatory Visit: Payer: Self-pay | Admitting: Cardiovascular Disease

## 2015-10-07 ENCOUNTER — Emergency Department: Payer: Medicare Other

## 2015-10-07 ENCOUNTER — Encounter: Payer: Self-pay | Admitting: Emergency Medicine

## 2015-10-07 ENCOUNTER — Emergency Department
Admission: EM | Admit: 2015-10-07 | Discharge: 2015-10-07 | Disposition: A | Discharge status: Home / Self Care | Payer: Medicare Other | Attending: Emergency Medicine | Admitting: Emergency Medicine

## 2015-10-07 DIAGNOSIS — Z79899 Other long term (current) drug therapy: Secondary | ICD-10-CM | POA: Diagnosis not present

## 2015-10-07 DIAGNOSIS — F17200 Nicotine dependence, unspecified, uncomplicated: Secondary | ICD-10-CM | POA: Insufficient documentation

## 2015-10-07 DIAGNOSIS — J340 Abscess, furuncle and carbuncle of nose: Secondary | ICD-10-CM | POA: Diagnosis not present

## 2015-10-07 DIAGNOSIS — Z7982 Long term (current) use of aspirin: Secondary | ICD-10-CM | POA: Insufficient documentation

## 2015-10-07 DIAGNOSIS — R05 Cough: Secondary | ICD-10-CM | POA: Diagnosis present

## 2015-10-07 DIAGNOSIS — Z7984 Long term (current) use of oral hypoglycemic drugs: Secondary | ICD-10-CM | POA: Diagnosis not present

## 2015-10-07 DIAGNOSIS — Z955 Presence of coronary angioplasty implant and graft: Secondary | ICD-10-CM | POA: Insufficient documentation

## 2015-10-07 DIAGNOSIS — I10 Essential (primary) hypertension: Secondary | ICD-10-CM | POA: Insufficient documentation

## 2015-10-07 DIAGNOSIS — J189 Pneumonia, unspecified organism: Secondary | ICD-10-CM

## 2015-10-07 DIAGNOSIS — I2510 Atherosclerotic heart disease of native coronary artery without angina pectoris: Secondary | ICD-10-CM | POA: Insufficient documentation

## 2015-10-07 DIAGNOSIS — J449 Chronic obstructive pulmonary disease, unspecified: Secondary | ICD-10-CM | POA: Diagnosis not present

## 2015-10-07 MED ORDER — MUPIROCIN CALCIUM 2 % EX CREA: TOPICAL_CREAM | CUTANEOUS | 0 refills | Status: DC

## 2015-10-07 MED ORDER — OXYCODONE HCL 5 MG PO TABS
30.0000 mg | ORAL_TABLET | Freq: Once | ORAL | Status: AC
  Administered 2015-10-07: 30 mg via ORAL
  Filled 2015-10-07: qty 6

## 2015-10-07 MED ORDER — PREDNISONE 50 MG PO TABS: 50.0000 mg | ORAL_TABLET | Freq: Every day | ORAL | 0 refills | Status: DC

## 2015-10-07 MED ORDER — DOXYCYCLINE HYCLATE 100 MG PO TABS: 100.0000 mg | ORAL_TABLET | Freq: Two times a day (BID) | ORAL | 0 refills | Status: DC

## 2015-10-07 NOTE — ED Notes (Signed)
Called in waiting room with no answer 

## 2015-10-07 NOTE — ED Triage Notes (Signed)
Patient presents to ED via EMS for an abcess in his right nostril that has been swelling and draining for the last 6 days. Patient also reports a productive cough with yellow sputum. Respirations even and not labored. Afebrile. No obvious distress.

## 2015-10-07 NOTE — ED Notes (Signed)
Pt presents with abscess to right side nostril for six days with drainage and swelling, pt with hx of MRSA. Also wants to be seen for cough that started last night.

## 2015-10-07 NOTE — ED Provider Notes (Signed)
[facility] Emergency Department Provider Note  ____________________________________________  Time seen: Approximately 3:45 PM  I have reviewed the triage vital signs and the nursing notes.   HISTORY  Chief Complaint Abscess    HPI Gregory Hall is a 60 y.o. male who presents to emergency department via EMS for complaint of abscess and nose and productive cough. Patient has a long-standing history of multiple medical complaints. Patient reports that he has had MRSA in the past but it is been several years in the past. Patient reports that he developed the abscess to his nose approximately 6 days ago. Area is very painful, draining, located at the distal aspect of the right nares. Patient denies any other previous history of bacterial abscesses to this region.  Patient is also endorsing coughing, chest congestion, with productive yellow/green phlegm. Patient states that he has a history of COPD and has bouts with mild pneumonia every 3-4 months. Patient states that he is typically placed on doxycycline and steroids with complete resolution of all symptoms. Patient denies any fevers or chills, difficulty breathing, nausea or vomiting. No other complaints at this time.   Past Medical History:  Diagnosis Date  . CAD (coronary artery disease)    s/p PTCA and stent x2  . Chest pain   . Chronic pain syndrome   . Degenerative cervical disc   . Dyslipidemia   . GERD (gastroesophageal reflux disease)   . Hernia 2014  . Hypertension   . MRSA (methicillin resistant staph aureus) culture positive 2011  . Neuropathy (HCC)   . Nutcracker esophagus   . Rectus diastasis 07/19/2012    Patient Active Problem List   Diagnosis Date Noted  . Subclavian artery stenosis, left 08/20/2014  . Rectus diastasis 07/19/2012  . Hernia   . Edema 05/10/2011  . Preoperative cardiovascular examination 10/06/2010  . SMOKER 11/25/2009  . CAROTID BRUIT, RIGHT 11/24/2009  .  CHEST PAIN-UNSPECIFIED 11/24/2009  . Hyperlipidemia 03/30/2009  . Coronary atherosclerosis 03/30/2009  . HYPERTENSION, HX OF 03/30/2009    Past Surgical History:  Procedure Laterality Date  . BACK SURGERY  2012  . CHOLECYSTECTOMY    . COLONOSCOPY  Jan 2014   [name]  . CORONARY ANGIOPLASTY WITH STENT PLACEMENT  2009   stents x2, in [place], [place]  . FOOT SURGERY     Right  . NECK SURGERY    . SPINE SURGERY  2012,2013  . TONSILLECTOMY      Prior to Admission medications   Medication Sig Start Date End Date Taking? Authorizing Provider  albuterol (PROVENTIL HFA;VENTOLIN HFA) 108 (90 Base) MCG/ACT inhaler Inhale 2 puffs into the lungs every 6 (six) hours as needed for wheezing or shortness of breath.    Historical Provider, MD  aspirin EC 81 MG tablet Take 81 mg by mouth daily.    Historical Provider, MD  atorvastatin (LIPITOR) 20 MG tablet Take 1 tablet (20 mg total) by mouth daily. 12/10/14   Iran Ouch, MD  budesonide-formoterol (SYMBICORT) 160-4.5 MCG/ACT inhaler Inhale 2 puffs into the lungs 2 (two) times daily.    Historical Provider, MD  busPIRone (BUSPAR) 10 MG tablet Take 10 mg by mouth 2 (two) times daily.    Historical Provider, MD  calcium carbonate (OSCAL) 1500 (600 Ca) MG TABS tablet Take 600 mg of elemental calcium by mouth 2 (two) times daily with a meal.    Historical Provider, MD  Cholecalciferol (VITAMIN D3) 5000 units CAPS Take 5,000 Units by mouth daily.  Historical Provider, MD  citalopram (CELEXA) 40 MG tablet Take 40 mg by mouth daily.    Historical Provider, MD  diazepam (VALIUM) 5 MG tablet Take 5 mg by mouth 2 (two) times daily.    Historical Provider, MD  doxycycline (VIBRA-TABS) 100 MG tablet Take 1 tablet (100 mg total) by mouth 2 (two) times daily. 10/07/15   Delorise Royals Lyrika Souders, PA-C  furosemide (LASIX) 20 MG tablet Take 20 mg by mouth daily.     Historical Provider, MD  hydroxypropyl methylcellulose / hypromellose (ISOPTO TEARS / GONIOVISC) 2.5 %  ophthalmic solution Place 1-2 drops into both eyes every 6 (six) hours as needed for dry eyes.    Historical Provider, MD  Linaclotide Karlene Einstein) 145 MCG CAPS capsule Take 145 mcg by mouth daily.    Historical Provider, MD  magnesium hydroxide (MILK OF MAGNESIA) 400 MG/5ML suspension Take 30 mLs by mouth daily as needed for mild constipation.    Historical Provider, MD  metFORMIN (GLUCOPHAGE) 500 MG tablet Take 500 mg by mouth 2 (two) times daily before a meal.     Historical Provider, MD  metoprolol succinate (TOPROL-XL) 25 MG 24 hr tablet Take 1 tablet (25 mg total) by mouth daily. Take with or immediately following a meal. 02/23/15   Iran Ouch, MD  mupirocin cream (BACTROBAN) 2 % Apply to affected area 3 times daily 10/07/15   Delorise Royals Hellen Shanley, PA-C  Naloxone HCl (EVZIO) 0.4 MG/0.4ML SOAJ Inject 1 Dose as directed once as needed (for opioid overdose).    Historical Provider, MD  NITROSTAT 0.4 MG SL tablet DISSOLVE (1) TABLET UNDER TONGUE AS NEEDED TO RELIEVE CHEST PAIN. MAYREPEAT EVERY 5 MINUTES. 10/06/15   Iran Ouch, MD  oxycodone (ROXICODONE) 30 MG immediate release tablet Take 30 mg by mouth every 4 (four) hours as needed for pain.     Historical Provider, MD  pantoprazole (PROTONIX) 40 MG tablet Take 40 mg by mouth 2 (two) times daily.     Historical Provider, MD  polyethylene glycol (MIRALAX / GLYCOLAX) packet Take 17 g by mouth daily as needed for mild constipation.     Historical Provider, MD  potassium chloride (K-DUR) 10 MEQ tablet Take 10 mEq by mouth 2 (two) times daily.    Historical Provider, MD  predniSONE (DELTASONE) 50 MG tablet Take 1 tablet (50 mg total) by mouth daily with breakfast. 10/07/15   Delorise Royals Quinnten Calvin, PA-C  promethazine (PHENERGAN) 25 MG tablet Take 25 mg by mouth 2 (two) times daily as needed for nausea or vomiting.     Historical Provider, MD  senna-docusate (SENOKOT-S) 8.6-50 MG tablet Take 1 tablet by mouth 2 (two) times daily.    Historical  Provider, MD  tamsulosin (FLOMAX) 0.4 MG CAPS capsule Take 0.4 mg by mouth daily.     Historical Provider, MD  tiotropium (SPIRIVA) 18 MCG inhalation capsule Place 18 mcg into inhaler and inhale daily.    Historical Provider, MD  traZODone (DESYREL) 100 MG tablet Take 200 mg by mouth at bedtime.    Historical Provider, MD    Allergies Acetaminophen and Nsaids  Family History  Problem Relation Age of Onset  . Family history unknown: Yes    Social History Social History  Substance Use Topics  . Smoking status: Current Every Day Smoker    Packs/day: 0.50    Years: 30.00  . Smokeless tobacco: Never Used  . Alcohol use No     Review of Systems  Constitutional: No fever/chills Eyes:  No visual changes. No discharge ENT: Positive for "abscess" to right nares Cardiovascular: no chest pain. Respiratory: Positive for productive cough. No SOB. Gastrointestinal: No abdominal pain.  No nausea, no vomiting.  No diarrhea.  No constipation. Musculoskeletal: Negative for musculoskeletal pain. Skin: Negative for rash, abrasions, lacerations, ecchymosis. Neurological: Negative for headaches, focal weakness or numbness. 10-point ROS otherwise negative.  ____________________________________________   PHYSICAL EXAM:  VITAL SIGNS: ED Triage Vitals [10/07/15 1521]  Enc Vitals Group     BP (!) 104/50     Pulse Rate 76     Resp 16     Temp 98.1 F (36.7 C)     Temp Source Oral     SpO2 99 %     Weight 164 lb (74.4 kg)     Height 6\' 2"  (1.88 m)     Head Circumference      Peak Flow      Pain Score 9     Pain Loc      Pain Edu?      Excl. in GC?      Constitutional: Alert and oriented. Well appearing and in no acute distress. Eyes: Conjunctivae are normal. PERRL. EOMI. Head: Atraumatic. ENT:      Ears:       Nose: No congestion/rhinnorhea.Erythematous, edematous, scabbed lesion noted to the distal aspect of the right nares. No drainage at this time. Palpation reveals extreme  tenderness but no fluctuance or induration. No express drainage with palpation.      Mouth/Throat: Mucous membranes are moist.  Neck: No stridor.    Cardiovascular: Normal rate, regular rhythm. Normal S1 and S2.  Good peripheral circulation. Respiratory: Normal respiratory effort without tachypnea or retractions. Lungs with crackles, wheezing, mild rales bilaterally. air entry to the bases with no decreased or absent breath sounds. Musculoskeletal: Full range of motion to all extremities. No gross deformities appreciated. Neurologic:  Normal speech and language. No gross focal neurologic deficits are appreciated.  Skin:  Skin is warm, dry and intact. No rash noted. Psychiatric: Mood and affect are normal. Speech and behavior are normal. Patient exhibits appropriate insight and judgement.   ____________________________________________   LABS (all labs ordered are listed, but only abnormal results are displayed)  Labs Reviewed - No data to display ____________________________________________  EKG   ____________________________________________  RADIOLOGY Peri Jefferson Jaris Kohles, personally viewed and evaluated these images (plain radiographs) as part of my medical decision making, as well as reviewing the written report by the radiologist.  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 Acute onset of productive cough yesterday. EXAM: CHEST  2 VIEW COMPARISON:  05/04/2015, 10/28/2013 and earlier, including CTA chest 03/30/2013. FINDINGS: Cardiac silhouette normal in size, unchanged. Thoracic aorta mildly atherosclerotic, unchanged. Left subclavian artery stent. Hilar and mediastinal contours otherwise unremarkable. Lungs hyperinflated with emphysematous changes throughout, unchanged. Mildly prominent bronchovascular markings diffusely and  mild central peribronchial thickening, unchanged. Lungs otherwise clear. No localized airspace consolidation. No pleural effusions. No pneumothorax. Normal pulmonary vascularity. Visualized bony thorax intact. IMPRESSION: 1. Stable COPD/emphysema.  No acute cardiopulmonary disease. 2. Thoracic aortic atherosclerosis. Electronically Signed   By: 04/01/2013 M.D.   On: 10/07/2015 16:31    ____________________________________________    PROCEDURES  Procedure(s) performed:    Procedures    Medications  oxyCODONE (Oxy IR/ROXICODONE) immediate release tablet 30 mg (30 mg Oral Given 10/07/15  1616)     ____________________________________________   INITIAL IMPRESSION / ASSESSMENT AND PLAN / ED COURSE  Pertinent labs & imaging results that were available during my care of the patient were reviewed by me and considered in my medical decision making (see chart for details).  Review of the [place] CSRS was performed in accordance of the [facility] prior to dispensing any controlled drugs.  Clinical Course    Patient's diagnosis is consistent with Superficial nasal abscess to the right nares, COPD completed by mild community-acquired pneumonia. Patient reports that he typically receives doxycycline for his repeated mild pneumonia cases. At this time, doxycycline will be prescribed as the antibiotic as it will cover both bacterial skin infection in the nares as well as pneumonia. Patient is given a short course of steroids to reduce inflammation in the lungs. He is also given topical antibiotic for bacterial skin infection. Patient will follow up with primary care as needed..  Patient is given ED precautions to return to the ED for any worsening or new symptoms.     ____________________________________________  FINAL CLINICAL IMPRESSION(S) / ED DIAGNOSES  Final diagnoses:  Nasal abscess  Chronic obstructive pulmonary disease, unspecified COPD type (HCC)  Community acquired pneumonia       NEW MEDICATIONS STARTED DURING THIS VISIT:  New Prescriptions   DOXYCYCLINE (VIBRA-TABS) 100 MG TABLET    Take 1 tablet (100 mg total) by mouth 2 (two) times daily.   MUPIROCIN CREAM (BACTROBAN) 2 %    Apply to affected area 3 times daily   PREDNISONE (DELTASONE) 50 MG TABLET    Take 1 tablet (50 mg total) by mouth daily with breakfast.        This chart was dictated using voice recognition software/Dragon. Despite best efforts to proofread, errors can occur which can change the meaning. Any change was purely unintentional.    Racheal Patches, PA-C 10/07/15 1648    Minna Antis, MD 10/07/15 2258

## 2015-11-10 ENCOUNTER — Emergency Department
Admission: EM | Admit: 2015-11-10 | Discharge: 2015-11-10 | Disposition: A | Discharge status: Home / Self Care | Payer: Medicare Other | Attending: Emergency Medicine | Admitting: Emergency Medicine

## 2015-11-10 DIAGNOSIS — E860 Dehydration: Secondary | ICD-10-CM | POA: Diagnosis not present

## 2015-11-10 DIAGNOSIS — F17200 Nicotine dependence, unspecified, uncomplicated: Secondary | ICD-10-CM | POA: Insufficient documentation

## 2015-11-10 DIAGNOSIS — R531 Weakness: Secondary | ICD-10-CM | POA: Diagnosis present

## 2015-11-10 DIAGNOSIS — R062 Wheezing: Secondary | ICD-10-CM | POA: Insufficient documentation

## 2015-11-10 DIAGNOSIS — I10 Essential (primary) hypertension: Secondary | ICD-10-CM | POA: Diagnosis not present

## 2015-11-10 DIAGNOSIS — Z7982 Long term (current) use of aspirin: Secondary | ICD-10-CM | POA: Diagnosis not present

## 2015-11-10 DIAGNOSIS — I2510 Atherosclerotic heart disease of native coronary artery without angina pectoris: Secondary | ICD-10-CM | POA: Diagnosis not present

## 2015-11-10 DIAGNOSIS — Z79899 Other long term (current) drug therapy: Secondary | ICD-10-CM | POA: Diagnosis not present

## 2015-11-10 DIAGNOSIS — Z7984 Long term (current) use of oral hypoglycemic drugs: Secondary | ICD-10-CM | POA: Insufficient documentation

## 2015-11-10 LAB — URINALYSIS COMPLETE WITH MICROSCOPIC (ARMC ONLY)
Bacteria, UA: NONE SEEN
Bilirubin Urine: NEGATIVE
Glucose, UA: NEGATIVE mg/dL
Hgb urine dipstick: NEGATIVE
Leukocytes, UA: NEGATIVE
Nitrite: NEGATIVE
Protein, ur: NEGATIVE mg/dL
Specific Gravity, Urine: 1.016 (ref 1.005–1.030)
pH: 6 (ref 5.0–8.0)

## 2015-11-10 LAB — CBC WITH DIFFERENTIAL/PLATELET
Basophils Absolute: 0.1 10*3/uL (ref 0–0.1)
Basophils Relative: 1 %
Eosinophils Absolute: 0.3 10*3/uL (ref 0–0.7)
Eosinophils Relative: 3 %
HCT: 41.3 % (ref 40.0–52.0)
Hemoglobin: 14.4 g/dL (ref 13.0–18.0)
Lymphocytes Relative: 24 %
Lymphs Abs: 2.1 10*3/uL (ref 1.0–3.6)
MCH: 32.6 pg (ref 26.0–34.0)
MCHC: 34.8 g/dL (ref 32.0–36.0)
MCV: 93.6 fL (ref 80.0–100.0)
Monocytes Absolute: 0.7 10*3/uL (ref 0.2–1.0)
Monocytes Relative: 8 %
Neutro Abs: 5.6 10*3/uL (ref 1.4–6.5)
Neutrophils Relative %: 64 %
Platelets: 238 10*3/uL (ref 150–440)
RBC: 4.41 MIL/uL (ref 4.40–5.90)
RDW: 14.6 % — ABNORMAL HIGH (ref 11.5–14.5)
WBC: 8.7 10*3/uL (ref 3.8–10.6)

## 2015-11-10 LAB — COMPREHENSIVE METABOLIC PANEL
ALT: 10 U/L — ABNORMAL LOW (ref 17–63)
AST: 16 U/L (ref 15–41)
Albumin: 3.6 g/dL (ref 3.5–5.0)
Alkaline Phosphatase: 74 U/L (ref 38–126)
Anion gap: 8 (ref 5–15)
BUN: 8 mg/dL (ref 6–20)
CO2: 34 mmol/L — ABNORMAL HIGH (ref 22–32)
Calcium: 9.4 mg/dL (ref 8.9–10.3)
Chloride: 95 mmol/L — ABNORMAL LOW (ref 101–111)
Creatinine, Ser: 1.36 mg/dL — ABNORMAL HIGH (ref 0.61–1.24)
GFR calc Af Amer: >60 mL/min (ref >60)
GFR calc non Af Amer: 54 mL/min — ABNORMAL LOW (ref >60)
Glucose, Bld: 120 mg/dL — ABNORMAL HIGH (ref 65–99)
Potassium: 3.8 mmol/L (ref 3.5–5.1)
Sodium: 137 mmol/L (ref 135–145)
Total Bilirubin: 0.3 mg/dL (ref 0.3–1.2)
Total Protein: 6.6 g/dL (ref 6.5–8.1)

## 2015-11-10 LAB — TROPONIN I: Troponin I: <0.03 ng/mL (ref <0.03)

## 2015-11-10 LAB — LACTIC ACID, PLASMA: Lactic Acid, Venous: 1.6 mmol/L (ref 0.5–1.9)

## 2015-11-10 MED ORDER — OXYCODONE HCL 5 MG PO TABS
15.0000 mg | ORAL_TABLET | Freq: Once | ORAL | Status: AC
  Administered 2015-11-10: 15 mg via ORAL
  Filled 2015-11-10: qty 3

## 2015-11-10 MED ORDER — SODIUM CHLORIDE 0.9 % IV BOLUS (SEPSIS)
1000.0000 mL | Freq: Once | INTRAVENOUS | Status: AC
  Administered 2015-11-10: 1000 mL via INTRAVENOUS

## 2015-11-10 MED ORDER — ONDANSETRON HCL 4 MG/2ML IJ SOLN
4.0000 mg | Freq: Once | INTRAMUSCULAR | Status: AC
  Administered 2015-11-10: 4 mg via INTRAVENOUS

## 2015-11-10 MED ORDER — OXYCODONE HCL 5 MG PO TABS: 30.0000 mg | ORAL_TABLET | Freq: Once | ORAL | Status: DC

## 2015-11-10 MED ORDER — ONDANSETRON HCL 4 MG/2ML IJ SOLN
INTRAMUSCULAR | Status: DC
Start: 2015-11-10 — End: 2015-11-11
  Filled 2015-11-10: qty 2

## 2015-11-10 NOTE — ED Triage Notes (Addendum)
Pt sent from PCP to ER due to low blood pressure. Reports systolic BP in the 70's X 2. Pt was at office due to being scheduled for an echo, which he had today. Pt denies CP. States he was treated for Community Acquired pneumonia over 1 week ago with doxycycline, feeling better since. Spoke with Derrill Kay, MD regarding patient sx. Verbal orders received.

## 2015-11-10 NOTE — Discharge Instructions (Signed)
Please seek medical attention for any high fevers, chest pain, shortness of breath, change in behavior, persistent vomiting, bloody stool or any other new or concerning symptoms.  

## 2015-11-10 NOTE — ED Provider Notes (Signed)
[facility] Emergency Department Provider Note   ____________________________________________   I have reviewed the triage vital signs and the nursing notes.   HISTORY  Chief Complaint Hypotension   History limited by: Not Limited   HPI Gregory Hall is a 60 y.o. male who presents to the emergency department today from clinic office (had echocardiogram done) because of findings for low blood pressure. The patient states that he has been feeling weak and unwell the whole day. He states that he felt fine yesterday when he went to bed. He has had this happen to him in the past. Does not think that he was drinking or eating his normal amount today. He denied any chest pain, shortness of breath or fevers. No vomiting or diarrhea.   Past Medical History:  Diagnosis Date  . CAD (coronary artery disease)    s/p PTCA and stent x2  . Chest pain   . Chronic pain syndrome   . Degenerative cervical disc   . Dyslipidemia   . GERD (gastroesophageal reflux disease)   . Hernia 2014  . Hypertension   . MRSA (methicillin resistant staph aureus) culture positive 2011  . Neuropathy (HCC)   . Nutcracker esophagus   . Rectus diastasis 07/19/2012    Patient Active Problem List   Diagnosis Date Noted  . Subclavian artery stenosis, left (HCC) 08/20/2014  . Rectus diastasis 07/19/2012  . Hernia   . Edema 05/10/2011  . Preoperative cardiovascular examination 10/06/2010  . SMOKER 11/25/2009  . CAROTID BRUIT, RIGHT 11/24/2009  . CHEST PAIN-UNSPECIFIED 11/24/2009  . Hyperlipidemia 03/30/2009  . Coronary atherosclerosis 03/30/2009  . HYPERTENSION, HX OF 03/30/2009    Past Surgical History:  Procedure Laterality Date  . BACK SURGERY  2012  . CHOLECYSTECTOMY    . COLONOSCOPY  Jan 2014   [name]  . CORONARY ANGIOPLASTY WITH STENT PLACEMENT  2009   stents x2, in [place], [place]  . FOOT SURGERY     Right  . NECK SURGERY    . SPINE SURGERY  2012,2013  .  TONSILLECTOMY      Prior to Admission medications   Medication Sig Start Date End Date Taking? Authorizing Provider  albuterol (PROVENTIL HFA;VENTOLIN HFA) 108 (90 Base) MCG/ACT inhaler Inhale 2 puffs into the lungs every 6 (six) hours as needed for wheezing or shortness of breath.    Historical Provider, MD  aspirin EC 81 MG tablet Take 81 mg by mouth daily.    Historical Provider, MD  atorvastatin (LIPITOR) 20 MG tablet Take 1 tablet (20 mg total) by mouth daily. 12/10/14   Iran Ouch, MD  budesonide-formoterol (SYMBICORT) 160-4.5 MCG/ACT inhaler Inhale 2 puffs into the lungs 2 (two) times daily.    Historical Provider, MD  busPIRone (BUSPAR) 10 MG tablet Take 10 mg by mouth 2 (two) times daily.    Historical Provider, MD  calcium carbonate (OSCAL) 1500 (600 Ca) MG TABS tablet Take 600 mg of elemental calcium by mouth 2 (two) times daily with a meal.    Historical Provider, MD  Cholecalciferol (VITAMIN D3) 5000 units CAPS Take 5,000 Units by mouth daily.    Historical Provider, MD  citalopram (CELEXA) 40 MG tablet Take 40 mg by mouth daily.    Historical Provider, MD  diazepam (VALIUM) 5 MG tablet Take 5 mg by mouth 2 (two) times daily.    Historical Provider, MD  doxycycline (VIBRA-TABS) 100 MG tablet Take 1 tablet (100 mg total) by mouth 2 (two) times daily. 10/07/15  Delorise Royals Cuthriell, PA-C  furosemide (LASIX) 20 MG tablet Take 20 mg by mouth daily.     Historical Provider, MD  hydroxypropyl methylcellulose / hypromellose (ISOPTO TEARS / GONIOVISC) 2.5 % ophthalmic solution Place 1-2 drops into both eyes every 6 (six) hours as needed for dry eyes.    Historical Provider, MD  Linaclotide Karlene Einstein) 145 MCG CAPS capsule Take 145 mcg by mouth daily.    Historical Provider, MD  magnesium hydroxide (MILK OF MAGNESIA) 400 MG/5ML suspension Take 30 mLs by mouth daily as needed for mild constipation.    Historical Provider, MD  metFORMIN (GLUCOPHAGE) 500 MG tablet Take 500 mg by mouth 2 (two)  times daily before a meal.     Historical Provider, MD  metoprolol succinate (TOPROL-XL) 25 MG 24 hr tablet Take 1 tablet (25 mg total) by mouth daily. Take with or immediately following a meal. 02/23/15   Iran Ouch, MD  mupirocin cream (BACTROBAN) 2 % Apply to affected area 3 times daily 10/07/15   Delorise Royals Cuthriell, PA-C  Naloxone HCl (EVZIO) 0.4 MG/0.4ML SOAJ Inject 1 Dose as directed once as needed (for opioid overdose).    Historical Provider, MD  NITROSTAT 0.4 MG SL tablet DISSOLVE (1) TABLET UNDER TONGUE AS NEEDED TO RELIEVE CHEST PAIN. MAYREPEAT EVERY 5 MINUTES. 10/06/15   Iran Ouch, MD  oxycodone (ROXICODONE) 30 MG immediate release tablet Take 30 mg by mouth every 4 (four) hours as needed for pain.     Historical Provider, MD  pantoprazole (PROTONIX) 40 MG tablet Take 40 mg by mouth 2 (two) times daily.     Historical Provider, MD  polyethylene glycol (MIRALAX / GLYCOLAX) packet Take 17 g by mouth daily as needed for mild constipation.     Historical Provider, MD  potassium chloride (K-DUR) 10 MEQ tablet Take 10 mEq by mouth 2 (two) times daily.    Historical Provider, MD  predniSONE (DELTASONE) 50 MG tablet Take 1 tablet (50 mg total) by mouth daily with breakfast. 10/07/15   Delorise Royals Cuthriell, PA-C  promethazine (PHENERGAN) 25 MG tablet Take 25 mg by mouth 2 (two) times daily as needed for nausea or vomiting.     Historical Provider, MD  senna-docusate (SENOKOT-S) 8.6-50 MG tablet Take 1 tablet by mouth 2 (two) times daily.    Historical Provider, MD  tamsulosin (FLOMAX) 0.4 MG CAPS capsule Take 0.4 mg by mouth daily.     Historical Provider, MD  tiotropium (SPIRIVA) 18 MCG inhalation capsule Place 18 mcg into inhaler and inhale daily.    Historical Provider, MD  traZODone (DESYREL) 100 MG tablet Take 200 mg by mouth at bedtime.    Historical Provider, MD    Allergies Acetaminophen and Nsaids  Family History  Problem Relation Age of Onset  . Family history unknown:  Yes    Social History Social History  Substance Use Topics  . Smoking status: Current Every Day Smoker    Packs/day: 0.50    Years: 30.00  . Smokeless tobacco: Never Used  . Alcohol use No    Review of Systems  Constitutional: Negative for fever. Cardiovascular: Negative for chest pain. Respiratory: Negative for shortness of breath. Gastrointestinal: Negative for abdominal pain, vomiting and diarrhea. Positive for some nausea.  Genitourinary: Negative for dysuria. Musculoskeletal: Negative for back pain. Skin: Negative for rash. Neurological: Negative for headaches, focal weakness or numbness.  10-point ROS otherwise negative.  ____________________________________________   PHYSICAL EXAM:  VITAL SIGNS: ED Triage Vitals  Enc  Vitals Group     BP 11/10/15 1649 (!) 95/54     Pulse Rate 11/10/15 1649 68     Resp 11/10/15 1649 18     Temp 11/10/15 1649 98 F (36.7 C)     Temp Source 11/10/15 1649 Oral     SpO2 11/10/15 1649 95 %     Weight 11/10/15 1650 158 lb (71.7 kg)     Height 11/10/15 1650 6\' 2"  (1.88 m)     Head Circumference --      Peak Flow --      Pain Score 11/10/15 1651 8   Constitutional: Alert and oriented. Well appearing and in no distress. Eyes: Conjunctivae are normal. Normal extraocular movements. ENT   Head: Normocephalic and atraumatic.   Nose: No congestion/rhinnorhea.   Mouth/Throat: Mucous membranes are moist.   Neck: No stridor. Hematological/Lymphatic/Immunilogical: No cervical lymphadenopathy. Cardiovascular: Normal rate, regular rhythm.  No murmurs, rubs, or gallops.  Respiratory: Normal respiratory effort without tachypnea nor retractions. Breath sounds are clear and equal bilaterally. Diffuse minimal wheezing. Gastrointestinal: Soft and nontender. No distention.  Genitourinary: Deferred Musculoskeletal: Normal range of motion in all extremities. No lower extremity edema. Neurologic:  Normal speech and language. No gross  focal neurologic deficits are appreciated.  Skin:  Skin is warm, dry and intact. No rash noted. Psychiatric: Mood and affect are normal. Speech and behavior are normal. Patient exhibits appropriate insight and judgment.  ____________________________________________    LABS (pertinent positives/negatives)  Labs Reviewed  URINALYSIS COMPLETEWITH MICROSCOPIC ([facility] ONLY) - Abnormal; Notable for the following:       Result Value   Color, Urine YELLOW (*)    APPearance CLEAR (*)    Ketones, ur TRACE (*)    Squamous Epithelial / LPF 0-5 (*)    All other components within normal limits  COMPREHENSIVE METABOLIC PANEL - Abnormal; Notable for the following:    Chloride 95 (*)    CO2 34 (*)    Glucose, Bld 120 (*)    Creatinine, Ser 1.36 (*)    ALT 10 (*)    GFR calc non Af Amer 54 (*)    All other components within normal limits  CBC WITH DIFFERENTIAL/PLATELET - Abnormal; Notable for the following:    RDW 14.6 (*)    All other components within normal limits  LACTIC ACID, PLASMA  TROPONIN I  LACTIC ACID, PLASMA     ____________________________________________   EKG  None  ____________________________________________    RADIOLOGY  None  ____________________________________________   PROCEDURES  Procedures  ____________________________________________   INITIAL IMPRESSION / ASSESSMENT AND PLAN / ED COURSE  Pertinent labs & imaging results that were available during my care of the patient were reviewed by me and considered in my medical decision making (see chart for details).  Patient here because of concern for hypotension. Blood work shows slightly elevated creatinine. Given IV fluids here. Blood pressure improved.  ____________________________________________   FINAL CLINICAL IMPRESSION(S) / ED DIAGNOSES  Final diagnoses:  Dehydration     Note: This dictation was prepared with Dragon dictation. Any transcriptional errors that result from this process  are unintentional    13/01/17, MD 11/10/15 2132

## 2015-11-10 NOTE — ED Notes (Signed)
Pt in via triage; pt reports being at doctors office today for a scheduled echocardiogram, pt hypotensive while at office, reports 70's systolic.  Pt reports feeling week with episodes of dizziness at that time, states, "I feel much better now."  Pt A/Ox4, ambulatory to room, vitals WDL, no immediate distress at this time.

## 2015-11-10 NOTE — ED Notes (Signed)
1 set of blood cultures sent after initiation of PIV.

## 2015-11-10 NOTE — ED Notes (Signed)
[facility] [employer] called per pt request.  Pt ambulatory to lobby.

## 2015-11-10 NOTE — ED Notes (Signed)
Pt requesting nausea medication

## 2015-11-22 ENCOUNTER — Telehealth: Payer: Self-pay | Admitting: Cardiovascular Disease

## 2015-11-22 NOTE — Telephone Encounter (Signed)
Pt c/o BP issue: STAT if pt c/o blurred vision, one-sided weakness or slurred speech  1. What are your last 5 BP readings?   11/01 patient was at [facility] ed bp was 95/54 and today at pcp office 90/41 sitting then a 20 pt drop when standing per patient   2. Are you having any other symptoms (ex. Dizziness, headache, blurred vision, passed out)?  Dizzy shaky wobbly when standing vision changes sometimes blackness in Periferal has to grab something to hold onto   3. What is your BP issue? Patient wants to be seen asap bo low and sx are concerning to patient

## 2015-11-22 NOTE — Telephone Encounter (Signed)
S/w gentleman who answered phone who states he is the owner of the assisted living facility where pt resides. He took pt to PCP appt today and reports BP was low. Pt was instructed to contact cardiology office. Driver states pt was taken back home and can reach him at his home number.  I called pt at home. Gentleman who answered states he does not know when patient will be home.  Will call again.

## 2015-11-23 NOTE — Telephone Encounter (Signed)
S/w pt who reports 90/41 at PCP (Dr. Lacie Scotts)  yesterday. Orthostatic positive.  Pressure was taken in the right arm. He was instructed to f/u w/cardiology. His BP is taken BID by staff at assisted home where he resides. He requests I s/w Onalee Hua [phone]) for a list of BP readings.  Left message for Johnny Bridge to call back.

## 2015-11-24 NOTE — Telephone Encounter (Signed)
S/w Johnny Bridge, supervisor-in-charge at his assisted home. Reports BP today 78/41, HR 82; BP 65/31, HR 74  States "he is doing alright. Walking around and talking and just finished eating".  She administers medications and confirmed she gives him metoprolol 25mg  BID.  to have pt seen today in an ER setting now as I am concerned of the low BP. Also advised her to hold metoprolol until he is seen by a physician.  She verbalized understanding and states she will call the nurse for the facility.  I provided my direct number for call back from the nurse.

## 2015-11-24 NOTE — Telephone Encounter (Signed)
S/w Gregory Hall again who reports she s/w her nurse at [facility] who agreed that pt needs to be seen in an ER setting. Pt told Gregory Hall he does not want to go to the ER but confirms being dizzy when he stands up.  I have advised they call 911 and a paramedic can check his BP and further advise.  She states she will hang up now and call 911.

## 2015-12-06 ENCOUNTER — Telehealth: Payer: Self-pay | Admitting: Cardiovascular Disease

## 2015-12-06 NOTE — Telephone Encounter (Signed)
Pt c/o swelling: STAT is pt has developed SOB within 24 hours  1. How long have you been experiencing swelling? Couple days  2. Where is the swelling located? Both feet, ankles and lower legs  3.  Are you currently taking a "fluid pill"? Yes, Lasix  4.  Are you currently SOB? no  5.  Have you traveled recently? Yes, Monday, last week from [place], to [employer] [place], [place]  States his legs have "stuff seeping out"

## 2015-12-06 NOTE — Telephone Encounter (Signed)
Pt reports bilateral feet, ankle, and lower legs swelling for two days. His feet are "weeping sticky, watery fluid" with some of the fluid having a yellow tint. He has been elevating his legs throughout the day and follows a low sodium diet. He takes lasix 20mg  qd as prescribed by , NP. He has not missed any doses.  He had a normal myoview in July w/normal EF. He is concerned of the extra fluid and would like advice on increasing lasix.  Advised pt to continue meds as prescribed and elevate legs. Will forward to MD.   Pt moved to [place], [place] last week into a transitional house and administers his own medications. BP runs low and he will purchase his own BP cuff to monitor pressures as staff at his last residence did not track it well. Reviewed parameters for metoprolol. Pt verbalized understanding.

## 2015-12-06 NOTE — Telephone Encounter (Signed)
I don't recommend increasing Lasix without being seen and examined given that his BP tends to run low.

## 2015-12-07 NOTE — Telephone Encounter (Signed)
S/w pt regarding MD recommendations. He has 11/30 appt w/Ryan [place], PA-C but is unsure if he will be able to come d/t transportation issues as he now lives in [place], [place]. States he may proceed to [facility] in [place] for an evaluation. Advised pt to be seen this week for sx. He verbalized understanding and will call back tomorrow w/update.

## 2015-12-09 ENCOUNTER — Ambulatory Visit: Payer: Medicare Other | Admitting: Physician Assistant

## 2016-03-09 ENCOUNTER — Telehealth: Payer: Self-pay | Admitting: Cardiovascular Disease

## 2016-03-09 NOTE — Telephone Encounter (Signed)
Attempted to schedule fu from recall list. 6 month fu per ckout 07/26/15   Patient moved to [facility] and is seeing a new cardiologist.  Deleting recall.

## 2016-08-24 ENCOUNTER — Telehealth: Payer: Self-pay | Admitting: Cardiovascular Disease

## 2016-08-24 NOTE — Telephone Encounter (Signed)
lmov to schedule 1 yr fu carotid  °

## 2016-09-25 ENCOUNTER — Encounter: Payer: Self-pay | Admitting: Cardiovascular Disease

## 2016-09-25 NOTE — Telephone Encounter (Signed)
3 attempts to schedule appt for fu  lmov  Sent Letter

## 2016-11-03 ENCOUNTER — Other Ambulatory Visit: Payer: Self-pay | Admitting: *Deleted

## 2016-11-03 DIAGNOSIS — I6523 Occlusion and stenosis of bilateral carotid arteries: Secondary | ICD-10-CM

## 2016-11-24 ENCOUNTER — Encounter: Payer: Self-pay | Admitting: Cardiovascular Disease

## 2016-11-28 ENCOUNTER — Encounter: Payer: Self-pay | Admitting: Cardiovascular Disease

## 2019-04-07 ENCOUNTER — Emergency Department: Payer: Medicare Other

## 2019-04-07 ENCOUNTER — Encounter: Payer: Self-pay | Admitting: Emergency Medicine

## 2019-04-07 ENCOUNTER — Emergency Department
Admission: EM | Admit: 2019-04-07 | Discharge: 2019-04-07 | Disposition: A | Discharge status: Home / Self Care | Payer: Medicare Other | Attending: Emergency Medicine | Admitting: Emergency Medicine

## 2019-04-07 ENCOUNTER — Other Ambulatory Visit: Payer: Self-pay

## 2019-04-07 DIAGNOSIS — F17210 Nicotine dependence, cigarettes, uncomplicated: Secondary | ICD-10-CM | POA: Diagnosis not present

## 2019-04-07 DIAGNOSIS — I10 Essential (primary) hypertension: Secondary | ICD-10-CM | POA: Diagnosis not present

## 2019-04-07 DIAGNOSIS — Y939 Activity, unspecified: Secondary | ICD-10-CM | POA: Insufficient documentation

## 2019-04-07 DIAGNOSIS — Y929 Unspecified place or not applicable: Secondary | ICD-10-CM | POA: Insufficient documentation

## 2019-04-07 DIAGNOSIS — M545 Low back pain: Secondary | ICD-10-CM | POA: Diagnosis not present

## 2019-04-07 DIAGNOSIS — W06XXXA Fall from bed, initial encounter: Secondary | ICD-10-CM | POA: Insufficient documentation

## 2019-04-07 DIAGNOSIS — Z7982 Long term (current) use of aspirin: Secondary | ICD-10-CM | POA: Diagnosis not present

## 2019-04-07 DIAGNOSIS — Y999 Unspecified external cause status: Secondary | ICD-10-CM | POA: Diagnosis not present

## 2019-04-07 DIAGNOSIS — Z79899 Other long term (current) drug therapy: Secondary | ICD-10-CM | POA: Insufficient documentation

## 2019-04-07 DIAGNOSIS — I2510 Atherosclerotic heart disease of native coronary artery without angina pectoris: Secondary | ICD-10-CM | POA: Diagnosis not present

## 2019-04-07 DIAGNOSIS — W19XXXA Unspecified fall, initial encounter: Secondary | ICD-10-CM

## 2019-04-07 DIAGNOSIS — G8929 Other chronic pain: Secondary | ICD-10-CM | POA: Diagnosis not present

## 2019-04-07 DIAGNOSIS — Z955 Presence of coronary angioplasty implant and graft: Secondary | ICD-10-CM | POA: Insufficient documentation

## 2019-04-07 MED ORDER — METHOCARBAMOL 500 MG PO TABS: 500.0000 mg | ORAL_TABLET | Freq: Three times a day (TID) | ORAL | 0 refills | Status: AC | PRN

## 2019-04-07 MED ORDER — HYDROMORPHONE HCL 1 MG/ML IJ SOLN
1.0000 mg | Freq: Once | INTRAMUSCULAR | Status: AC
  Administered 2019-04-07: 1 mg via INTRAMUSCULAR
  Filled 2019-04-07: qty 1

## 2019-04-07 MED ORDER — OXYCODONE-ACETAMINOPHEN 5-325 MG PO TABS
2.0000 | ORAL_TABLET | Freq: Once | ORAL | Status: AC
  Administered 2019-04-07: 2 via ORAL
  Filled 2019-04-07: qty 2

## 2019-04-07 NOTE — ED Notes (Signed)
See triage note  Presents s/p fall  States  Slipped onto floor and landing on buttocks   States he was trying to get to his w/c from chair

## 2019-04-07 NOTE — ED Notes (Signed)
Pt waiting to be traiged. Came EMS. Asked for O2 tank. EMS said he was stating 98% room air. Pt said he is always on 3 liters so tech connected him with O2. Pt also said he fell right on tail bone and wanted to know if he could have a softer place to sit. I told him after he was triaged maybe he could sit in a recliner.   lw edt

## 2019-04-07 NOTE — ED Triage Notes (Signed)
Pt here for back pain that radiates down right leg after falling to floor on buttocks while transferring from chair to wheelchair.  Alert and oriented. Pt reports own guardian.  Pt denies being unconscious with fall.  He felt dazed, "starry headed" after fall. Did not hit head.

## 2019-04-07 NOTE — ED Triage Notes (Signed)
Pt comes into the ED via EMS from [facility] with c/o syncope while transferring from his wheelchair to another chair and is c/o tailbone pain. Caregiver reports the pt did not have a syncople episode and is always seeking pain meds.

## 2019-04-07 NOTE — ED Notes (Signed)
Gave pt a sandwich tray and soda with ice.

## 2019-04-07 NOTE — ED Provider Notes (Signed)
Emergency Department Provider Note  ____________________________________________  Time seen: Approximately 8:41 PM  I have reviewed the triage vital signs and the nursing notes.   HISTORY  Chief Complaint Fall   Historian Patient     HPI Gregory Hall is a 64 y.o. male presents to the emergency department with acute on chronic low back pain.  Patient states that he takes oxycodone 10 mg tablets daily for his chronic pain and has a pain management contract at the facility in [facility].  Patient reports that she recently moved to the area and has not established care with a new pain management facility.  1 day ago, patient states that he fell while transitioning from his bed to his wheelchair.  Patient states that he has been using a wheelchair for the past 2 to 3 weeks after he pulled a muscle in his low back.  Patient states that he can ambulate but states that he has pain so he prefers to use the wheelchair.  He denies bowel or bladder incontinence or saddle anesthesia.  Patient states that he feels knee pain along the sacral region and along the lumbar spine.  Patient states that he has here for pain medicine stronger than oxycodone 10 but states that he only wants to be discharged with a muscle relaxer.  Patient related that he had an MRI of his lumbar spine 6 months ago with no acute abnormality visualized.   Past Medical History:  Diagnosis Date  . CAD (coronary artery disease)    s/p PTCA and stent x2  . Chest pain   . Chronic pain syndrome   . Degenerative cervical disc   . Dyslipidemia   . GERD (gastroesophageal reflux disease)   . Hernia 2014  . Hypertension   . MRSA (methicillin resistant staph aureus) culture positive 2011  . Neuropathy   . Nutcracker esophagus   . Rectus diastasis 07/19/2012     Immunizations up to date:  Yes.     Past Medical History:  Diagnosis Date  . CAD (coronary artery disease)    s/p PTCA and stent x2  .  Chest pain   . Chronic pain syndrome   . Degenerative cervical disc   . Dyslipidemia   . GERD (gastroesophageal reflux disease)   . Hernia 2014  . Hypertension   . MRSA (methicillin resistant staph aureus) culture positive 2011  . Neuropathy   . Nutcracker esophagus   . Rectus diastasis 07/19/2012    Patient Active Problem List   Diagnosis Date Noted  . Subclavian artery stenosis, left (HCC) 08/20/2014  . Rectus diastasis 07/19/2012  . Hernia   . Edema 05/10/2011  . Preoperative cardiovascular examination 10/06/2010  . SMOKER 11/25/2009  . CAROTID BRUIT, RIGHT 11/24/2009  . CHEST PAIN-UNSPECIFIED 11/24/2009  . Hyperlipidemia 03/30/2009  . Coronary atherosclerosis 03/30/2009  . HYPERTENSION, HX OF 03/30/2009    Past Surgical History:  Procedure Laterality Date  . BACK SURGERY  2012  . CHOLECYSTECTOMY    . COLONOSCOPY  Jan 2014   [name]  . CORONARY ANGIOPLASTY WITH STENT PLACEMENT  2009   stents x2, in [place], [place]  . FOOT SURGERY     Right  . NECK SURGERY    . SPINE SURGERY  2012,2013  . TONSILLECTOMY      Prior to Admission medications   Medication Sig Start Date End Date Taking? Authorizing Provider  albuterol (PROVENTIL HFA;VENTOLIN HFA) 108 (90 Base) MCG/ACT inhaler Inhale 2 puffs into the lungs every 6 (  six) hours as needed for wheezing or shortness of breath.    [provider]  aspirin EC 81 MG tablet Take 81 mg by mouth daily.    [provider]  atorvastatin (LIPITOR) 20 MG tablet Take 1 tablet (20 mg total) by mouth daily. 12/10/14   Wellington Hampshire, MD  budesonide-formoterol (SYMBICORT) 160-4.5 MCG/ACT inhaler Inhale 2 puffs into the lungs 2 (two) times daily.    [provider]  busPIRone (BUSPAR) 10 MG tablet Take 10 mg by mouth 2 (two) times daily.    [provider]  calcium carbonate (OSCAL) 1500 (600 Ca) MG TABS tablet Take 600 mg of elemental calcium by mouth 2 (two) times daily with a meal.    [provider]  Cholecalciferol (VITAMIN D3) 5000 units CAPS Take 5,000 Units by mouth daily.    [provider]  citalopram (CELEXA) 40 MG tablet Take 40 mg by mouth daily.    [provider]  diazepam (VALIUM) 5 MG tablet Take 5 mg by mouth 2 (two) times daily.    [provider]  doxycycline (VIBRA-TABS) 100 MG tablet Take 1 tablet (100 mg total) by mouth 2 (two) times daily. 10/07/15   Cuthriell, Charline Bills, PA-C  furosemide (LASIX) 20 MG tablet Take 20 mg by mouth daily.     [provider]  hydroxypropyl methylcellulose / hypromellose (ISOPTO TEARS / GONIOVISC) 2.5 % ophthalmic solution Place 1-2 drops into both eyes every 6 (six) hours as needed for dry eyes.    [provider]  Linaclotide Rolan Lipa) 145 MCG CAPS capsule Take 145 mcg by mouth daily.    [provider]  magnesium hydroxide (MILK OF MAGNESIA) 400 MG/5ML suspension Take 30 mLs by mouth daily as needed for mild constipation.    [provider]  metFORMIN (GLUCOPHAGE) 500 MG tablet Take 500 mg by mouth 2 (two) times daily before a meal.     [provider]  methocarbamol (ROBAXIN) 500 MG tablet Take 1 tablet (500 mg total) by mouth every 8 (eight) hours as needed for up to 5 days. 04/07/19 04/12/19  Lannie Fields, PA-C  metoprolol succinate (TOPROL-XL) 25 MG 24 hr tablet Take 1 tablet (25 mg total) by mouth daily. Take with or immediately following a meal. 02/23/15   Wellington Hampshire, MD  mupirocin cream (BACTROBAN) 2 % Apply to affected area 3 times daily 10/07/15   Cuthriell, Charline Bills, PA-C  Naloxone HCl (EVZIO) 0.4 MG/0.4ML SOAJ Inject 1 Dose as directed once as needed (for opioid overdose).    [provider]  NITROSTAT 0.4 MG SL tablet DISSOLVE (1) TABLET UNDER TONGUE AS NEEDED TO RELIEVE CHEST PAIN. MAYREPEAT EVERY 5 MINUTES. 10/06/15   Wellington Hampshire, MD  oxycodone (ROXICODONE) 30 MG immediate release tablet Take 30 mg by mouth every 4 (four)  hours as needed for pain.     [provider]  pantoprazole (PROTONIX) 40 MG tablet Take 40 mg by mouth 2 (two) times daily.     [provider]  polyethylene glycol (MIRALAX / GLYCOLAX) packet Take 17 g by mouth daily as needed for mild constipation.     [provider]  potassium chloride (K-DUR) 10 MEQ tablet Take 10 mEq by mouth 2 (two) times daily.    [provider]  predniSONE (DELTASONE) 50 MG tablet Take 1 tablet (50 mg total) by mouth daily with breakfast. 10/07/15   Cuthriell, Charline Bills, PA-C  promethazine (PHENERGAN) 25 MG  tablet Take 25 mg by mouth 2 (two) times daily as needed for nausea or vomiting.     [provider]  senna-docusate (SENOKOT-S) 8.6-50 MG tablet Take 1 tablet by mouth 2 (two) times daily.    [provider]  tamsulosin (FLOMAX) 0.4 MG CAPS capsule Take 0.4 mg by mouth daily.     [provider]  tiotropium (SPIRIVA) 18 MCG inhalation capsule Place 18 mcg into inhaler and inhale daily.    [provider]  traZODone (DESYREL) 100 MG tablet Take 200 mg by mouth at bedtime.    [provider]    Allergies Acetaminophen and Nsaids  Family History  Family history unknown: Yes    Social History Social History   Tobacco Use  . Smoking status: Current Every Day Smoker    Packs/day: 0.50    Years: 30.00    Pack years: 15.00  . Smokeless tobacco: Never Used  Substance Use Topics  . Alcohol use: No  . Drug use: No     Review of Systems  Constitutional: No fever/chills Eyes:  No discharge ENT: No upper respiratory complaints. Respiratory: no cough. No SOB/ use of accessory muscles to breath Gastrointestinal:   No nausea, no vomiting.  No diarrhea.  No constipation. Musculoskeletal: Patient has low back pain.  Skin: Negative for rash, abrasions, lacerations, ecchymosis.    ____________________________________________   PHYSICAL EXAM:  VITAL SIGNS: ED Triage Vitals   Enc Vitals Group     BP 04/07/19 1740 (!) 147/75     Pulse Rate 04/07/19 1740 73     Resp 04/07/19 1740 16     Temp 04/07/19 1740 98.5 F (36.9 C)     Temp Source 04/07/19 1740 Oral     SpO2 04/07/19 1740 96 %     Weight 04/07/19 1737 205 lb (93 kg)     Height 04/07/19 1737 6' (1.829 m)     Head Circumference --      Peak Flow --      Pain Score 04/07/19 1737 9     Pain Loc --      Pain Edu? --      Excl. in GC? --      Constitutional: Alert and oriented. Well appearing and in no acute distress. Eyes: Conjunctivae are normal. PERRL. EOMI. Head: Atraumatic. Cardiovascular: Normal rate, regular rhythm. Normal S1 and S2.  Good peripheral circulation. Respiratory: Normal respiratory effort without tachypnea or retractions. Lungs CTAB. Good air entry to the bases with no decreased or absent breath sounds Gastrointestinal: Bowel sounds x 4 quadrants. Soft and nontender to palpation. No guarding or rigidity. No distention. Musculoskeletal: Full range of motion to all extremities. No obvious deformities noted.  Patient has midline lumbar spine tenderness along L1 and along sacrum. Neurologic:  Normal for age. No gross focal neurologic deficits are appreciated.  Skin:  Skin is warm, dry and intact. No rash noted. Psychiatric: Mood and affect are normal for age. Speech and behavior are normal.   ____________________________________________   LABS (all labs ordered are listed, but only abnormal results are displayed)  Labs Reviewed - No data to display ____________________________________________  EKG   ____________________________________________  RADIOLOGY Geraldo Pitter, personally viewed and evaluated these images (plain radiographs) as part of my medical decision making, as well as reviewing the written report by the radiologist.  DG Lumbar Spine 2-3 Views  Result Date: 04/07/2019 CLINICAL DATA:  Low back pain EXAM: LUMBAR SPINE - 2-3 VIEW COMPARISON:  09/21/2011, CT  06/14/2012 FINDINGS: Right posterior spinal stimulator generator. Posterior rods and fixating screws at L4 on L5 with interbody device. Stable lumbar alignment. Possible age indeterminate superior endplate deformity at L1. Remaining vertebral bodies demonstrate normal stature. Aortic atherosclerosis. IMPRESSION: 1. Hardware at L4-L5 appears grossly intact 2. Possible age indeterminate superior endplate deformity at L1. Otherwise no acute osseous abnormality. Electronically Signed   By: Jasmine Pang M.D.   On: 04/07/2019 18:59   DG Sacrum/Coccyx  Result Date: 04/07/2019 CLINICAL DATA:  Back pain after fall EXAM: SACRUM AND COCCYX - 2+ VIEW COMPARISON:  CT 06/24/2012 FINDINGS: There is no evidence of fracture or other focal bone lesions. Hardware at L4-L5. Aortic atherosclerosis. IMPRESSION: Negative. Electronically Signed   By: Jasmine Pang M.D.   On: 04/07/2019 19:00   CT Head Wo Contrast  Result Date: 04/07/2019 CLINICAL DATA:  Head injury. Fall with back pain extending into the right buttocks. Intracranial venous injury suspected. EXAM: CT HEAD WITHOUT CONTRAST CT CERVICAL SPINE WITHOUT CONTRAST TECHNIQUE: Multidetector CT imaging of the head and cervical spine was performed following the standard protocol without intravenous contrast. Multiplanar CT image reconstructions of the cervical spine were also generated. COMPARISON:  CT head 03/04/2015. Cervical MRI 09/07/2014 and cervical spine CT 09/21/2011 FINDINGS: CT HEAD FINDINGS Brain: There is no evidence of acute intracranial hemorrhage, mass lesion, brain edema or extra-axial fluid collection. Mild atrophy with mild prominence of the ventricles and subarachnoid spaces. There is no CT evidence of acute cortical infarction. Vascular: Intracranial vascular calcifications. No hyperdense vessel identified. Skull: Negative for fracture or focal lesion. Sinuses/Orbits: The visualized paranasal sinuses and mastoid air cells are clear. No orbital abnormalities  are seen. Other: None. CT CERVICAL SPINE FINDINGS Alignment: Similar to previous studies with a mild anterolisthesis at C4-5. Skull base and vertebrae: No evidence of acute fracture or traumatic subluxation. Status post anterior discectomy and fusion from C4 through C6. The hardware is intact without loosening. Evidence of some solid interbody fusion at both levels. Soft tissues and spinal canal: No prevertebral fluid or swelling. No visible canal hematoma. Disc levels: No large disc herniation or high-grade spinal stenosis identified. There is bilateral facet hypertrophy at C2-3 and C3-4 without resulting significant foraminal compromise. The right facet joint appears fused at C4-5. Residual uncinate spurring at C5-6 contributes to mild foraminal narrowing bilaterally. There is also mild foraminal narrowing at C6-7 secondary to uncinate spurring. There is moderate facet hypertrophy bilaterally at C7-T1. Upper chest: Mild emphysema and scarring at both lung apices. Other: Bilateral carotid atherosclerosis. IMPRESSION: 1. No acute intracranial or calvarial findings. 2. No evidence of acute cervical spine fracture, traumatic subluxation or static signs of instability. 3. Previous C4 through C6 ACDF without complication. Spondylosis as described. Electronically Signed   By: Carey Bullocks M.D.   On: 04/07/2019 18:49   CT Cervical Spine Wo Contrast  Result Date: 04/07/2019 CLINICAL DATA:  Head injury. Fall with back pain extending into the right buttocks. Intracranial venous injury suspected. EXAM: CT HEAD WITHOUT CONTRAST CT CERVICAL SPINE WITHOUT CONTRAST TECHNIQUE: Multidetector CT imaging of the head and cervical spine was performed following the standard protocol without intravenous contrast. Multiplanar CT image reconstructions of the cervical spine were also generated. COMPARISON:  CT head 03/04/2015. Cervical MRI 09/07/2014 and cervical spine CT 09/21/2011 FINDINGS: CT HEAD FINDINGS Brain: There is no  evidence of acute intracranial hemorrhage, mass lesion, brain edema or extra-axial fluid collection. Mild atrophy with mild prominence of the ventricles and subarachnoid spaces. There  is no CT evidence of acute cortical infarction. Vascular: Intracranial vascular calcifications. No hyperdense vessel identified. Skull: Negative for fracture or focal lesion. Sinuses/Orbits: The visualized paranasal sinuses and mastoid air cells are clear. No orbital abnormalities are seen. Other: None. CT CERVICAL SPINE FINDINGS Alignment: Similar to previous studies with a mild anterolisthesis at C4-5. Skull base and vertebrae: No evidence of acute fracture or traumatic subluxation. Status post anterior discectomy and fusion from C4 through C6. The hardware is intact without loosening. Evidence of some solid interbody fusion at both levels. Soft tissues and spinal canal: No prevertebral fluid or swelling. No visible canal hematoma. Disc levels: No large disc herniation or high-grade spinal stenosis identified. There is bilateral facet hypertrophy at C2-3 and C3-4 without resulting significant foraminal compromise. The right facet joint appears fused at C4-5. Residual uncinate spurring at C5-6 contributes to mild foraminal narrowing bilaterally. There is also mild foraminal narrowing at C6-7 secondary to uncinate spurring. There is moderate facet hypertrophy bilaterally at C7-T1. Upper chest: Mild emphysema and scarring at both lung apices. Other: Bilateral carotid atherosclerosis. IMPRESSION: 1. No acute intracranial or calvarial findings. 2. No evidence of acute cervical spine fracture, traumatic subluxation or static signs of instability. 3. Previous C4 through C6 ACDF without complication. Spondylosis as described. Electronically Signed   By: Carey Bullocks M.D.   On: 04/07/2019 18:49   CT Lumbar Spine Wo Contrast  Result Date: 04/07/2019 CLINICAL DATA:  Back pain radiating into the right leg after falling from a chair today.  EXAM: CT LUMBAR SPINE WITHOUT CONTRAST TECHNIQUE: Multidetector CT imaging of the lumbar spine was performed without intravenous contrast administration. Multiplanar CT image reconstructions were also generated. COMPARISON:  Lumbar spine radiographs 04/07/2019. Abdominopelvic CT 06/24/2012. FINDINGS: Segmentation: There are 5 lumbar type vertebral bodies. Alignment: Slight anterolisthesis at L4-5 appears chronic status post laminectomy and PLIF. Otherwise normal. Vertebrae: There is a mild superior endplate compression fracture at L1 which is off midline to the right and appears acute. This is associated with up to 6 mm of depression of the superior endplate. The posterior elements are intact. No other fractures are seen within the lumbar spine, although there are mildly displaced bilateral sacral fractures which are incompletely visualized. Associated mild angulation on the sagittal images. The visualized sacroiliac joints are intact. Intact hardware without loosening status post laminectomy and PLIF at L4-5. Paraspinal and other soft tissues: Mild paraspinal edema anteriorly at T12-L1, supporting an acute nature of the L1 compression deformity. Aortic and branch vessel atherosclerosis. There is a thoracic spinal stimulator with generator right lower back. Disc levels: No significant disc space findings from T12-L1 through L2-3. L3-4: Loss of disc height with annular disc bulging, facet and ligamentous hypertrophy contributing to mild-to-moderate spinal stenosis. The foramina appear sufficiently patent. L4-5: Postsurgical changes post laminectomy and interbody fusion. The spinal canal and neural foramina are widely patent. L5-S1: Advanced facet disease, worse on the right. No significant foraminal narrowing. IMPRESSION: 1. Acute mild superior endplate compression deformity at L1. 2. Mildly displaced bilateral sacral fractures, incompletely visualized. 3. Previous L4-5 laminectomy and PLIF. 4. Adjacent segment  disease with mild to moderate multifactorial spinal stenosis at L3-4. 5. Aortic Atherosclerosis (ICD10-I70.0). Electronically Signed   By: Carey Bullocks M.D.   On: 04/07/2019 19:49    ____________________________________________    PROCEDURES  Procedure(s) performed:     Procedures     Medications  oxyCODONE-acetaminophen (PERCOCET/ROXICET) 5-325 MG per tablet 2 tablet (2 tablets Oral Given 04/07/19 1842)  HYDROmorphone (DILAUDID) injection  1 mg (1 mg Intramuscular Given 04/07/19 2026)     ____________________________________________   INITIAL IMPRESSION / ASSESSMENT AND PLAN / ED COURSE  Pertinent labs & imaging results that were available during my care of the patient were reviewed by me and considered in my medical decision making (see chart for details).      Assessment and Plan:  Low back pain 64 year old male presents to the emergency department with acute on chronic low back pain.  Vital signs are reassuring in the emergency department.  Work-up conducted in the emergency department revealed bilateral mildly displaced sacral fractures and an L1 superior endplate deformity, age-indeterminate.  Patient requested stronger pain medication and oxycodone 10.  I agreed to give him Dilaudid in the emergency department once.  I cautioned patient that if he would return to the emergency department with an exacerbation of chronic low back pain, Dilaudid would not be given again.  Patient assured me that he still had a clinical supply of oxycodone tens at home.  He was discharged home with Robaxin.  Referral to neurosurgery was given.  All patient questions were answered.   ____________________________________________  FINAL CLINICAL IMPRESSION(S) / ED DIAGNOSES  Final diagnoses:  Fall, initial encounter      NEW MEDICATIONS STARTED DURING THIS VISIT:  ED Discharge Orders         Ordered    methocarbamol (ROBAXIN) 500 MG tablet  Every 8 hours PRN     04/07/19  2038              This chart was dictated using voice recognition software/Dragon. Despite best efforts to proofread, errors can occur which can change the meaning. Any change was purely unintentional.     Gasper Lloyd 04/07/19 2054    Phineas Semen, MD 04/07/19 2107

## 2019-04-07 NOTE — ED Notes (Signed)
This RN called pt cargiver Jimmye Norman and informed her of pt discharge, states that her group home has an account with [employer] and the pt can take the taxi back to the home.

## 2019-04-07 NOTE — ED Notes (Signed)
Gave pt saltine crackers and soda.

## 2019-04-07 NOTE — ED Notes (Signed)
This RN called [facility] [employer] and they state that they will arrive in the next 30 minutes and take pt back to his group home.

## 2019-05-25 ENCOUNTER — Emergency Department
Admission: EM | Admit: 2019-05-25 | Discharge: 2019-05-25 | Disposition: A | Discharge status: Home / Self Care | Payer: Medicare Other | Attending: Emergency Medicine | Admitting: Emergency Medicine

## 2019-05-25 ENCOUNTER — Other Ambulatory Visit: Payer: Self-pay

## 2019-05-25 ENCOUNTER — Emergency Department: Payer: Medicare Other

## 2019-05-25 ENCOUNTER — Encounter: Payer: Self-pay | Admitting: Emergency Medicine

## 2019-05-25 DIAGNOSIS — M533 Sacrococcygeal disorders, not elsewhere classified: Secondary | ICD-10-CM | POA: Diagnosis not present

## 2019-05-25 DIAGNOSIS — Y9389 Activity, other specified: Secondary | ICD-10-CM | POA: Insufficient documentation

## 2019-05-25 DIAGNOSIS — Y999 Unspecified external cause status: Secondary | ICD-10-CM | POA: Diagnosis not present

## 2019-05-25 DIAGNOSIS — Z7982 Long term (current) use of aspirin: Secondary | ICD-10-CM | POA: Insufficient documentation

## 2019-05-25 DIAGNOSIS — Z79899 Other long term (current) drug therapy: Secondary | ICD-10-CM | POA: Diagnosis not present

## 2019-05-25 DIAGNOSIS — Z7984 Long term (current) use of oral hypoglycemic drugs: Secondary | ICD-10-CM | POA: Insufficient documentation

## 2019-05-25 DIAGNOSIS — I2510 Atherosclerotic heart disease of native coronary artery without angina pectoris: Secondary | ICD-10-CM | POA: Insufficient documentation

## 2019-05-25 DIAGNOSIS — Y92129 Unspecified place in nursing home as the place of occurrence of the external cause: Secondary | ICD-10-CM | POA: Insufficient documentation

## 2019-05-25 DIAGNOSIS — Y92009 Unspecified place in unspecified non-institutional (private) residence as the place of occurrence of the external cause: Secondary | ICD-10-CM

## 2019-05-25 DIAGNOSIS — F17200 Nicotine dependence, unspecified, uncomplicated: Secondary | ICD-10-CM | POA: Diagnosis not present

## 2019-05-25 DIAGNOSIS — W19XXXA Unspecified fall, initial encounter: Secondary | ICD-10-CM

## 2019-05-25 DIAGNOSIS — M545 Low back pain, unspecified: Secondary | ICD-10-CM

## 2019-05-25 DIAGNOSIS — W050XXA Fall from non-moving wheelchair, initial encounter: Secondary | ICD-10-CM | POA: Insufficient documentation

## 2019-05-25 DIAGNOSIS — G8929 Other chronic pain: Secondary | ICD-10-CM

## 2019-05-25 DIAGNOSIS — Z955 Presence of coronary angioplasty implant and graft: Secondary | ICD-10-CM | POA: Diagnosis not present

## 2019-05-25 DIAGNOSIS — I10 Essential (primary) hypertension: Secondary | ICD-10-CM | POA: Diagnosis not present

## 2019-05-25 LAB — BASIC METABOLIC PANEL
Anion gap: 11 (ref 5–15)
BUN: 14 mg/dL (ref 8–23)
CO2: 32 mmol/L (ref 22–32)
Calcium: 8.9 mg/dL (ref 8.9–10.3)
Chloride: 96 mmol/L — ABNORMAL LOW (ref 98–111)
Creatinine, Ser: 1.11 mg/dL (ref 0.61–1.24)
GFR calc Af Amer: >60 mL/min (ref >60)
GFR calc non Af Amer: >60 mL/min (ref >60)
Glucose, Bld: 212 mg/dL — ABNORMAL HIGH (ref 70–99)
Potassium: 5 mmol/L (ref 3.5–5.1)
Sodium: 139 mmol/L (ref 135–145)

## 2019-05-25 LAB — CBC
HCT: 48.9 % (ref 39.0–52.0)
Hemoglobin: 15.4 g/dL (ref 13.0–17.0)
MCH: 28.6 pg (ref 26.0–34.0)
MCHC: 31.5 g/dL (ref 30.0–36.0)
MCV: 90.7 fL (ref 80.0–100.0)
Platelets: 236 10*3/uL (ref 150–400)
RBC: 5.39 MIL/uL (ref 4.22–5.81)
RDW: 17.2 % — ABNORMAL HIGH (ref 11.5–15.5)
WBC: 10.3 10*3/uL (ref 4.0–10.5)
nRBC: 0 % (ref 0.0–0.2)

## 2019-05-25 LAB — GLUCOSE, CAPILLARY: Glucose-Capillary: 205 mg/dL — ABNORMAL HIGH (ref 70–99)

## 2019-05-25 MED ORDER — CYCLOBENZAPRINE HCL 5 MG PO TABS
5.0000 mg | ORAL_TABLET | Freq: Three times a day (TID) | ORAL | 0 refills | Status: DC | PRN
Start: 2019-05-25 — End: 2020-02-06

## 2019-05-25 MED ORDER — OXYCODONE HCL 5 MG PO TABS
10.0000 mg | ORAL_TABLET | Freq: Once | ORAL | Status: AC
  Administered 2019-05-25: 10 mg via ORAL
  Filled 2019-05-25: qty 2

## 2019-05-25 MED ORDER — OXYCODONE-ACETAMINOPHEN 5-325 MG PO TABS: 2.0000 | ORAL_TABLET | Freq: Once | ORAL | Status: DC

## 2019-05-25 MED ORDER — HYDROMORPHONE HCL 1 MG/ML IJ SOLN
1.0000 mg | Freq: Once | INTRAMUSCULAR | Status: AC
  Administered 2019-05-25: 1 mg via INTRAMUSCULAR
  Filled 2019-05-25: qty 1

## 2019-05-25 NOTE — ED Provider Notes (Signed)
[facility] Emergency Department Provider Note ____________________________________________  Time seen: 1546  I have reviewed the triage vital signs and the nursing notes.  HISTORY  Chief Complaint  Fall  HPI Gregory Hall is a 64 y.o. male presents to the ED via EMS, from his long-term care facility.   Patient describes a fall after he did to the to transfer himself from his wheelchair to a standard chair.  He is wheelchair-bound he does not ambulate.  He apparently fell landing on his buttocks, but denies any head injury or LOC.  Patient complains primarily of pain to the buttocks from the fall.  He reports he is also out of his chronic pain medicine which is oxycodone 10 mg q. 6 hours.  Patient has a history of type 2 diabetes, coronary artery disease, hypertension, COPD, chronic heart failure, and chronic opioid use.  Past Medical History:  Diagnosis Date  . CAD (coronary artery disease)    s/p PTCA and stent x2  . Chest pain   . Chronic pain syndrome   . Degenerative cervical disc   . Dyslipidemia   . GERD (gastroesophageal reflux disease)   . Hernia 2014  . Hypertension   . MRSA (methicillin resistant staph aureus) culture positive 2011  . Neuropathy   . Nutcracker esophagus   . Rectus diastasis 07/19/2012    Patient Active Problem List   Diagnosis Date Noted  . Subclavian artery stenosis, left (HCC) 08/20/2014  . Rectus diastasis 07/19/2012  . Hernia   . Edema 05/10/2011  . Preoperative cardiovascular examination 10/06/2010  . SMOKER 11/25/2009  . CAROTID BRUIT, RIGHT 11/24/2009  . CHEST PAIN-UNSPECIFIED 11/24/2009  . Hyperlipidemia 03/30/2009  . Coronary atherosclerosis 03/30/2009  . HYPERTENSION, HX OF 03/30/2009    Past Surgical History:  Procedure Laterality Date  . BACK SURGERY  2012  . CHOLECYSTECTOMY    . COLONOSCOPY  Jan 2014   [name]  . CORONARY ANGIOPLASTY WITH STENT PLACEMENT  2009   stents x2, in [place], [place]  .  FOOT SURGERY     Right  . NECK SURGERY    . SPINE SURGERY  2012,2013  . TONSILLECTOMY      Prior to Admission medications   Medication Sig Start Date End Date Taking? Authorizing Provider  albuterol (PROVENTIL HFA;VENTOLIN HFA) 108 (90 Base) MCG/ACT inhaler Inhale 2 puffs into the lungs every 6 (six) hours as needed for wheezing or shortness of breath.    [provider]  aspirin EC 81 MG tablet Take 81 mg by mouth daily.    [provider]  atorvastatin (LIPITOR) 20 MG tablet Take 1 tablet (20 mg total) by mouth daily. 12/10/14   Iran Ouch, MD  budesonide-formoterol (SYMBICORT) 160-4.5 MCG/ACT inhaler Inhale 2 puffs into the lungs 2 (two) times daily.    [provider]  busPIRone (BUSPAR) 10 MG tablet Take 10 mg by mouth 2 (two) times daily.    [provider]  calcium carbonate (OSCAL) 1500 (600 Ca) MG TABS tablet Take 600 mg of elemental calcium by mouth 2 (two) times daily with a meal.    [provider]  Cholecalciferol (VITAMIN D3) 5000 units CAPS Take 5,000 Units by mouth daily.    [provider]  citalopram (CELEXA) 40 MG tablet Take 40 mg by mouth daily.    [provider]  cyclobenzaprine (FLEXERIL) 5 MG tablet Take 1 tablet (5 mg total) by mouth 3 (three) times daily as needed. 05/25/19   Witten Certain,  Dannielle Karvonen, PA-C  diazepam (VALIUM) 5 MG tablet Take 5 mg by mouth 2 (two) times daily.    [provider]  doxycycline (VIBRA-TABS) 100 MG tablet Take 1 tablet (100 mg total) by mouth 2 (two) times daily. 10/07/15   Cuthriell, Charline Bills, PA-C  furosemide (LASIX) 20 MG tablet Take 20 mg by mouth daily.     [provider]  hydroxypropyl methylcellulose / hypromellose (ISOPTO TEARS / GONIOVISC) 2.5 % ophthalmic solution Place 1-2 drops into both eyes every 6 (six) hours as needed for dry eyes.    [provider]  Linaclotide Rolan Lipa) 145 MCG CAPS capsule Take 145 mcg by mouth daily.     [provider]  magnesium hydroxide (MILK OF MAGNESIA) 400 MG/5ML suspension Take 30 mLs by mouth daily as needed for mild constipation.    [provider]  metFORMIN (GLUCOPHAGE) 500 MG tablet Take 500 mg by mouth 2 (two) times daily before a meal.     [provider]  metoprolol succinate (TOPROL-XL) 25 MG 24 hr tablet Take 1 tablet (25 mg total) by mouth daily. Take with or immediately following a meal. 02/23/15   Wellington Hampshire, MD  mupirocin cream (BACTROBAN) 2 % Apply to affected area 3 times daily 10/07/15   Cuthriell, Charline Bills, PA-C  Naloxone HCl (EVZIO) 0.4 MG/0.4ML SOAJ Inject 1 Dose as directed once as needed (for opioid overdose).    [provider]  NITROSTAT 0.4 MG SL tablet DISSOLVE (1) TABLET UNDER TONGUE AS NEEDED TO RELIEVE CHEST PAIN. MAYREPEAT EVERY 5 MINUTES. 10/06/15   Wellington Hampshire, MD  oxycodone (ROXICODONE) 30 MG immediate release tablet Take 30 mg by mouth every 4 (four) hours as needed for pain.     [provider]  pantoprazole (PROTONIX) 40 MG tablet Take 40 mg by mouth 2 (two) times daily.     [provider]  polyethylene glycol (MIRALAX / GLYCOLAX) packet Take 17 g by mouth daily as needed for mild constipation.     [provider]  potassium chloride (K-DUR) 10 MEQ tablet Take 10 mEq by mouth 2 (two) times daily.    [provider]  predniSONE (DELTASONE) 50 MG tablet Take 1 tablet (50 mg total) by mouth daily with breakfast. 10/07/15   Cuthriell, Charline Bills, PA-C  promethazine (PHENERGAN) 25 MG tablet Take 25 mg by mouth 2 (two) times daily as needed for nausea or vomiting.     [provider]  senna-docusate (SENOKOT-S) 8.6-50 MG tablet Take 1 tablet by mouth 2 (two) times daily.    [provider]  tamsulosin (FLOMAX) 0.4 MG CAPS capsule Take 0.4 mg by mouth daily.     [provider]  tiotropium (SPIRIVA) 18 MCG inhalation capsule Place 18 mcg into inhaler and  inhale daily.    [provider]  traZODone (DESYREL) 100 MG tablet Take 200 mg by mouth at bedtime.    [provider]    Allergies Acetaminophen and Nsaids  Family History  Family history unknown: Yes    Social History Social History   Tobacco Use  . Smoking status: Current Every Day Smoker    Packs/day: 0.50    Years: 30.00    Pack years: 15.00  . Smokeless tobacco: Never Used  Substance Use Topics  . Alcohol use: No  . Drug use: No    Review of Systems  Constitutional: Negative for fever. Cardiovascular: Negative for chest pain. Respiratory: Negative for shortness of  breath. Gastrointestinal: Negative for abdominal pain, vomiting and diarrhea. Genitourinary: Negative for dysuria. Musculoskeletal: Positive for back pain. Skin: Negative for rash. Neurological: Negative for headaches, focal weakness or numbness. ____________________________________________  PHYSICAL EXAM:  VITAL SIGNS: ED Triage Vitals  Enc Vitals Group     BP 05/25/19 1404 (!) 147/78     Pulse Rate 05/25/19 1404 (!) 110     Resp 05/25/19 1404 18     Temp 05/25/19 1404 (!) 97.5 F (36.4 C)     Temp Source 05/25/19 1404 Oral     SpO2 05/25/19 1404 95 %     Weight 05/25/19 1404 212 lb (96.2 kg)     Height 05/25/19 1404 6' (1.829 m)     Head Circumference --      Peak Flow --      Pain Score 05/25/19 1403 9     Pain Loc --      Pain Edu? --      Excl. in GC? --     Constitutional: Alert and oriented. Well appearing and in no distress. Head: Normocephalic and atraumatic. Eyes: Conjunctivae are normal. Normal extraocular movements Cardiovascular: Normal rate, regular rhythm. Normal distal pulses. Respiratory: Normal respiratory effort. No wheezes/rales/rhonchi. Gastrointestinal: Soft and nontender. No distention. Musculoskeletal: Mildly tender to palpation to the lumbar sacral spine.  Nontender with normal range of motion in all extremities.  Neurologic:  Normal gait  without ataxia. Normal speech and language. No gross focal neurologic deficits are appreciated. Skin:  Skin is warm, dry and intact. No rash noted. Psychiatric: Mood and affect are normal. Patient exhibits appropriate insight and judgment. ____________________________________________   LABS (pertinent positives/negatives)  Labs Reviewed  BASIC METABOLIC PANEL - Abnormal; Notable for the following components:      Result Value   Chloride 96 (*)    Glucose, Bld 212 (*)    All other components within normal limits  CBC - Abnormal; Notable for the following components:   RDW 17.2 (*)    All other components within normal limits  GLUCOSE, CAPILLARY - Abnormal; Notable for the following components:   Glucose-Capillary 205 (*)    All other components within normal limits  URINALYSIS, COMPLETE (UACMP) WITH MICROSCOPIC  CBG MONITORING, ED  ___________________________________________  EKG  ____________________________________________   RADIOLOGY  CXR  IMPRESSION: No active cardiopulmonary disease. Signs of COPD.  CT Lumbar Spine  IMPRESSION: 1. Subacute L1 compression fracture with mildly progressive height loss. 2. Healing bilateral sacral fractures. 3. No new fracture identified. 4. Unchanged disc and facet degeneration at L3-4 with mild-to-moderate spinal stenosis. 5. Aortic Atherosclerosis (ICD10-I70.0). ____________________________________________  PROCEDURES  Oxycodone IR 10 mg PO Dilaudid 1 mg IM  Procedures ____________________________________________  INITIAL IMPRESSION / ASSESSMENT AND PLAN / ED COURSE  Patient with ED evaluation of acute on chronic low back pain following a mechanical fall.  Patient describes slipping as he tried to transition from his wheelchair to a standard chair.  He denies hitting his head, describing 9 out of his buttocks, with his back up against a wall.  He presents via EMS for symptoms related to his fall.  His primary complaints  are lumbar sacral pain.  He was concerned because he had a recent fall which showed an age-indeterminate fracture.  CT imaging today reveals no acute fractures and no hardware disruption.  Patient is reassured by his exam.  He reports improved pain after initial dose of oral medication.  He is discharged to return to his facility.  A single dose  of Dilaudid is provided just prior to discharge.  Return precautions have again been reviewed.  April Carlyon was evaluated in Emergency Department on 05/25/2019 for the symptoms described in the history of present illness. He was evaluated in the context of the global COVID-19 pandemic, which necessitated consideration that the patient might be at risk for infection with the SARS-CoV-2 virus that causes COVID-19. Institutional protocols and algorithms that pertain to the evaluation of patients at risk for COVID-19 are in a state of rapid change based on information released by regulatory bodies including the CDC and federal and state organizations. These policies and algorithms were followed during the patient's care in the ED.  I reviewed the patient's prescription history over the last 12 months in the multi-state controlled substances database(s) that includes [place], [place], [place], [place], [place], [place], [place], [place], [place], [place], [place], [place], [place], and [place].  Results were notable for monthly RXs noted.  ____________________________________________  FINAL CLINICAL IMPRESSION(S) / ED DIAGNOSES  Final diagnoses:  Fall in home, initial encounter  Acute exacerbation of chronic low back pain      Karmen Stabs, Charlesetta Ivory, PA-C 05/25/19 1907    Jene Every, MD 05/25/19 Windell Moment

## 2019-05-25 NOTE — Discharge Instructions (Addendum)
Your exam and CT scans are normal at this time. There is no evidence of a new fracture or hardware disruption.

## 2019-05-25 NOTE — ED Triage Notes (Addendum)
Pt arrived via [facility] from [facility], pt had a fall going from wheelchair to regular chair. Pt reports he is wheelchair bound and does not walk. Pt landed on buttocks, did not hit head.  Pt c/o buttocks pain from fall.   Pt wears 2L [place] for COPD was not on oxygen on arrival. Sats 89% on RA, pt placed on 2L.   Pt reports he is out of pain medication as well.  States the Assisted Living Facility let him run out of medication Oxycodone 10mg  every 6 hours.  Last dose was at 2am, Pt states he is unable to get any medication until tomorrow when the pharmacy opens.   pt states he fell at 530am today. Pt states his insulin dosage has been adjusted recently and thinks he fell because his sugar dropped.

## 2019-05-25 NOTE — ED Triage Notes (Signed)
First nurse note Ems reports that patient was transferring himself from his wheelchair to another chair and dropped down. Pt is c/o lower back pain, care home also reported that the patient is out of his oxycodone, was supposed  to get a dose at 0800, can't get his prescription refilled until tomorrow

## 2019-06-09 ENCOUNTER — Other Ambulatory Visit: Payer: Self-pay

## 2019-06-09 ENCOUNTER — Emergency Department: Payer: Medicare Other

## 2019-06-09 ENCOUNTER — Encounter: Payer: Self-pay | Admitting: Emergency Medicine

## 2019-06-09 ENCOUNTER — Inpatient Hospital Stay
Admission: EM | Admit: 2019-06-09 | Discharge: 2019-06-14 | DRG: 190 | Disposition: A | Discharge status: Home / Self Care | Payer: Medicare Other | Attending: Internal Medicine | Admitting: Internal Medicine

## 2019-06-09 DIAGNOSIS — Z9981 Dependence on supplemental oxygen: Secondary | ICD-10-CM

## 2019-06-09 DIAGNOSIS — G894 Chronic pain syndrome: Secondary | ICD-10-CM | POA: Diagnosis present

## 2019-06-09 DIAGNOSIS — R739 Hyperglycemia, unspecified: Secondary | ICD-10-CM

## 2019-06-09 DIAGNOSIS — Z8614 Personal history of Methicillin resistant Staphylococcus aureus infection: Secondary | ICD-10-CM

## 2019-06-09 DIAGNOSIS — E785 Hyperlipidemia, unspecified: Secondary | ICD-10-CM | POA: Diagnosis present

## 2019-06-09 DIAGNOSIS — I2510 Atherosclerotic heart disease of native coronary artery without angina pectoris: Secondary | ICD-10-CM | POA: Diagnosis present

## 2019-06-09 DIAGNOSIS — F17210 Nicotine dependence, cigarettes, uncomplicated: Secondary | ICD-10-CM | POA: Diagnosis present

## 2019-06-09 DIAGNOSIS — R0602 Shortness of breath: Secondary | ICD-10-CM

## 2019-06-09 DIAGNOSIS — G4700 Insomnia, unspecified: Secondary | ICD-10-CM | POA: Diagnosis present

## 2019-06-09 DIAGNOSIS — E1165 Type 2 diabetes mellitus with hyperglycemia: Secondary | ICD-10-CM | POA: Diagnosis present

## 2019-06-09 DIAGNOSIS — Z955 Presence of coronary angioplasty implant and graft: Secondary | ICD-10-CM

## 2019-06-09 DIAGNOSIS — J9621 Acute and chronic respiratory failure with hypoxia: Secondary | ICD-10-CM | POA: Diagnosis not present

## 2019-06-09 DIAGNOSIS — Z79891 Long term (current) use of opiate analgesic: Secondary | ICD-10-CM

## 2019-06-09 DIAGNOSIS — I10 Essential (primary) hypertension: Secondary | ICD-10-CM | POA: Diagnosis present

## 2019-06-09 DIAGNOSIS — Z888 Allergy status to other drugs, medicaments and biological substances status: Secondary | ICD-10-CM

## 2019-06-09 DIAGNOSIS — K224 Dyskinesia of esophagus: Secondary | ICD-10-CM | POA: Diagnosis present

## 2019-06-09 DIAGNOSIS — Z20822 Contact with and (suspected) exposure to covid-19: Secondary | ICD-10-CM | POA: Diagnosis present

## 2019-06-09 DIAGNOSIS — K219 Gastro-esophageal reflux disease without esophagitis: Secondary | ICD-10-CM | POA: Diagnosis present

## 2019-06-09 DIAGNOSIS — Z7952 Long term (current) use of systemic steroids: Secondary | ICD-10-CM

## 2019-06-09 DIAGNOSIS — F419 Anxiety disorder, unspecified: Secondary | ICD-10-CM | POA: Diagnosis present

## 2019-06-09 DIAGNOSIS — Z7982 Long term (current) use of aspirin: Secondary | ICD-10-CM

## 2019-06-09 DIAGNOSIS — Z79899 Other long term (current) drug therapy: Secondary | ICD-10-CM

## 2019-06-09 DIAGNOSIS — J441 Chronic obstructive pulmonary disease with (acute) exacerbation: Principal | ICD-10-CM | POA: Diagnosis present

## 2019-06-09 DIAGNOSIS — G629 Polyneuropathy, unspecified: Secondary | ICD-10-CM | POA: Diagnosis present

## 2019-06-09 DIAGNOSIS — IMO0002 Reserved for concepts with insufficient information to code with codable children: Secondary | ICD-10-CM | POA: Diagnosis present

## 2019-06-09 DIAGNOSIS — R079 Chest pain, unspecified: Secondary | ICD-10-CM

## 2019-06-09 DIAGNOSIS — R0600 Dyspnea, unspecified: Secondary | ICD-10-CM

## 2019-06-09 DIAGNOSIS — J9622 Acute and chronic respiratory failure with hypercapnia: Secondary | ICD-10-CM | POA: Diagnosis not present

## 2019-06-09 DIAGNOSIS — Z7951 Long term (current) use of inhaled steroids: Secondary | ICD-10-CM

## 2019-06-09 DIAGNOSIS — J9811 Atelectasis: Secondary | ICD-10-CM | POA: Diagnosis present

## 2019-06-09 LAB — CBC WITH DIFFERENTIAL/PLATELET
Abs Immature Granulocytes: 0.03 10*3/uL (ref 0.00–0.07)
Basophils Absolute: 0 10*3/uL (ref 0.0–0.1)
Basophils Relative: 1 %
Eosinophils Absolute: 0.2 10*3/uL (ref 0.0–0.5)
Eosinophils Relative: 2 %
HCT: 44.1 % (ref 39.0–52.0)
Hemoglobin: 14 g/dL (ref 13.0–17.0)
Immature Granulocytes: 0 %
Lymphocytes Relative: 31 %
Lymphs Abs: 2.7 10*3/uL (ref 0.7–4.0)
MCH: 29.4 pg (ref 26.0–34.0)
MCHC: 31.7 g/dL (ref 30.0–36.0)
MCV: 92.5 fL (ref 80.0–100.0)
Monocytes Absolute: 0.8 10*3/uL (ref 0.1–1.0)
Monocytes Relative: 9 %
Neutro Abs: 4.9 10*3/uL (ref 1.7–7.7)
Neutrophils Relative %: 57 %
Platelets: 216 10*3/uL (ref 150–400)
RBC: 4.77 MIL/uL (ref 4.22–5.81)
RDW: 16.1 % — ABNORMAL HIGH (ref 11.5–15.5)
WBC: 8.6 10*3/uL (ref 4.0–10.5)
nRBC: 0 % (ref 0.0–0.2)

## 2019-06-09 LAB — BLOOD GAS, VENOUS
Acid-Base Excess: 9.2 mmol/L — ABNORMAL HIGH (ref 0.0–2.0)
Bicarbonate: 37.5 mmol/L — ABNORMAL HIGH (ref 20.0–28.0)
FIO2: 0.28
O2 Saturation: 89.2 %
Patient temperature: 37
pCO2, Ven: 68 mmHg — ABNORMAL HIGH (ref 44.0–60.0)
pH, Ven: 7.35 (ref 7.250–7.430)
pO2, Ven: 60 mmHg — ABNORMAL HIGH (ref 32.0–45.0)

## 2019-06-09 LAB — BASIC METABOLIC PANEL
Anion gap: 6 (ref 5–15)
BUN: 10 mg/dL (ref 8–23)
CO2: 35 mmol/L — ABNORMAL HIGH (ref 22–32)
Calcium: 8.5 mg/dL — ABNORMAL LOW (ref 8.9–10.3)
Chloride: 98 mmol/L (ref 98–111)
Creatinine, Ser: 1.08 mg/dL (ref 0.61–1.24)
GFR calc Af Amer: >60 mL/min (ref >60)
GFR calc non Af Amer: >60 mL/min (ref >60)
Glucose, Bld: 270 mg/dL — ABNORMAL HIGH (ref 70–99)
Potassium: 4.9 mmol/L (ref 3.5–5.1)
Sodium: 139 mmol/L (ref 135–145)

## 2019-06-09 LAB — BRAIN NATRIURETIC PEPTIDE: B Natriuretic Peptide: 51.5 pg/mL (ref 0.0–100.0)

## 2019-06-09 LAB — TROPONIN I (HIGH SENSITIVITY): Troponin I (High Sensitivity): 7 ng/L (ref <18)

## 2019-06-09 LAB — SARS CORONAVIRUS 2 BY RT PCR (HOSPITAL ORDER, PERFORMED IN ~~LOC~~ HOSPITAL LAB): SARS Coronavirus 2: NEGATIVE

## 2019-06-09 MED ORDER — IPRATROPIUM-ALBUTEROL 0.5-2.5 (3) MG/3ML IN SOLN
3.0000 mL | Freq: Once | RESPIRATORY_TRACT | Status: AC
  Administered 2019-06-09: 3 mL via RESPIRATORY_TRACT
  Filled 2019-06-09: qty 3

## 2019-06-09 MED ORDER — ALBUTEROL SULFATE (2.5 MG/3ML) 0.083% IN NEBU: 2.5000 mg | INHALATION_SOLUTION | RESPIRATORY_TRACT | Status: DC | PRN

## 2019-06-09 MED ORDER — ALBUTEROL SULFATE (2.5 MG/3ML) 0.083% IN NEBU
INHALATION_SOLUTION | RESPIRATORY_TRACT | Status: AC
  Filled 2019-06-09: qty 12

## 2019-06-09 MED ORDER — ATORVASTATIN CALCIUM 20 MG PO TABS
20.0000 mg | ORAL_TABLET | Freq: Every day | ORAL | Status: DC
  Administered 2019-06-10: 20 mg via ORAL
  Filled 2019-06-09: qty 1

## 2019-06-09 MED ORDER — PREDNISONE 20 MG PO TABS: 40.0000 mg | ORAL_TABLET | Freq: Every day | ORAL | Status: DC

## 2019-06-09 MED ORDER — ENOXAPARIN SODIUM 40 MG/0.4ML ~~LOC~~ SOLN
40.0000 mg | SUBCUTANEOUS | Status: DC
  Administered 2019-06-10: 40 mg via SUBCUTANEOUS
  Filled 2019-06-09: qty 0.4

## 2019-06-09 MED ORDER — OXYCODONE-ACETAMINOPHEN 5-325 MG PO TABS
2.0000 | ORAL_TABLET | Freq: Once | ORAL | Status: AC
  Administered 2019-06-09: 2 via ORAL
  Filled 2019-06-09: qty 2

## 2019-06-09 MED ORDER — METHYLPREDNISOLONE SODIUM SUCC 40 MG IJ SOLR
40.0000 mg | Freq: Four times a day (QID) | INTRAMUSCULAR | Status: DC
  Administered 2019-06-10 (×3): 40 mg via INTRAVENOUS
  Filled 2019-06-09 (×3): qty 1

## 2019-06-09 MED ORDER — OXYCODONE HCL 5 MG PO TABS
5.0000 mg | ORAL_TABLET | ORAL | Status: DC | PRN
  Administered 2019-06-10: 5 mg via ORAL
  Filled 2019-06-09: qty 1

## 2019-06-09 MED ORDER — INSULIN ASPART 100 UNIT/ML ~~LOC~~ SOLN
0.0000 [IU] | Freq: Three times a day (TID) | SUBCUTANEOUS | Status: DC
  Administered 2019-06-10: 9 [IU] via SUBCUTANEOUS
  Administered 2019-06-10: 3 [IU] via SUBCUTANEOUS
  Administered 2019-06-10: 9 [IU] via SUBCUTANEOUS
  Filled 2019-06-09 (×3): qty 1

## 2019-06-09 MED ORDER — NITROGLYCERIN 0.4 MG SL SUBL: 0.4000 mg | SUBLINGUAL_TABLET | SUBLINGUAL | Status: DC | PRN

## 2019-06-09 MED ORDER — ALBUTEROL SULFATE (2.5 MG/3ML) 0.083% IN NEBU
10.0000 mg/h | INHALATION_SOLUTION | Freq: Once | RESPIRATORY_TRACT | Status: AC
  Administered 2019-06-09: 10 mg/h via RESPIRATORY_TRACT

## 2019-06-09 MED ORDER — IPRATROPIUM-ALBUTEROL 0.5-2.5 (3) MG/3ML IN SOLN
3.0000 mL | Freq: Four times a day (QID) | RESPIRATORY_TRACT | Status: DC
  Administered 2019-06-10 – 2019-06-14 (×17): 3 mL via RESPIRATORY_TRACT
  Filled 2019-06-09 (×18): qty 3

## 2019-06-09 MED ORDER — METOPROLOL SUCCINATE ER 25 MG PO TB24
25.0000 mg | ORAL_TABLET | Freq: Every day | ORAL | Status: DC
  Administered 2019-06-10 – 2019-06-14 (×5): 25 mg via ORAL
  Filled 2019-06-09 (×5): qty 1

## 2019-06-09 MED ORDER — BUSPIRONE HCL 10 MG PO TABS
10.0000 mg | ORAL_TABLET | Freq: Two times a day (BID) | ORAL | Status: DC
  Administered 2019-06-10 – 2019-06-14 (×10): 10 mg via ORAL
  Filled 2019-06-09 (×4): qty 1
  Filled 2019-06-09: qty 2
  Filled 2019-06-09 (×5): qty 1

## 2019-06-09 NOTE — ED Provider Notes (Signed)
ER Provider Note       Time seen: 9:14 PM    I have reviewed the vital signs and the nursing notes.  HISTORY   Chief Complaint No chief complaint on file.    HPI Gregory Hall is a 64 y.o. male with a history of coronary artery disease, chronic pain, GERD, COPD, hypertension who presents today for COPD exacerbation.  Patient was given Solu-Medrol in route and was having chest pain that radiated down his left arm.  He is also requesting pain medicine for his chronic pain.  Past Medical History:  Diagnosis Date  . CAD (coronary artery disease)    s/p PTCA and stent x2  . Chest pain   . Chronic pain syndrome   . Degenerative cervical disc   . Dyslipidemia   . GERD (gastroesophageal reflux disease)   . Hernia 2014  . Hypertension   . MRSA (methicillin resistant staph aureus) culture positive 2011  . Neuropathy   . Nutcracker esophagus   . Rectus diastasis 07/19/2012    Past Surgical History:  Procedure Laterality Date  . BACK SURGERY  2012  . CHOLECYSTECTOMY    . COLONOSCOPY  Jan 2014   [name]  . CORONARY ANGIOPLASTY WITH STENT PLACEMENT  2009   stents x2, in [place], [place]  . FOOT SURGERY     Right  . NECK SURGERY    . SPINE SURGERY  2012,2013  . TONSILLECTOMY      Allergies Acetaminophen and Nsaids  Review of Systems Constitutional: Negative for fever. Cardiovascular: Positive for chest pain Respiratory: Positive for shortness of breath and cough Gastrointestinal: Negative for abdominal pain, vomiting and diarrhea. Musculoskeletal: Negative for back pain. Skin: Negative for rash. Neurological: Negative for headaches, focal weakness or numbness.  All systems negative/normal/unremarkable except as stated in the HPI  ____________________________________________   PHYSICAL EXAM:  VITAL SIGNS: There were no vitals filed for this visit.  Constitutional: Alert and oriented. Well appearing and in no distress. Eyes: Conjunctivae are normal.  Normal extraocular movements. ENT      Head: Normocephalic and atraumatic.      Nose: No congestion/rhinnorhea.      Mouth/Throat: Mucous membranes are moist.      Neck: No stridor. Cardiovascular: Normal rate, regular rhythm. No murmurs, rubs, or gallops. Respiratory: Normal respiratory effort without tachypnea nor retractions. Breath sounds are clear and equal bilaterally. No wheezes/rales/rhonchi. Gastrointestinal: Soft and nontender. Normal bowel sounds Musculoskeletal: Nontender with normal range of motion in extremities. No lower extremity tenderness nor edema. Neurologic:  Normal speech and language. No gross focal neurologic deficits are appreciated.  Skin:  Skin is warm, dry and intact. No rash noted. Psychiatric: Speech and behavior are normal.  ____________________________________________  EKG: Interpreted by me.  Sinus rhythm with rate of 93 bpm, normal PR interval, leftward axis, normal QT  ____________________________________________   LABS (pertinent positives/negatives)  Labs Reviewed  CBC WITH DIFFERENTIAL/PLATELET - Abnormal; Notable for the following components:      Result Value   RDW 16.1 (*)    All other components within normal limits  BASIC METABOLIC PANEL - Abnormal; Notable for the following components:   CO2 35 (*)    Glucose, Bld 270 (*)    Calcium 8.5 (*)    All other components within normal limits  SARS CORONAVIRUS 2 BY RT PCR (HOSPITAL ORDER, [facility])  BRAIN NATRIURETIC PEPTIDE  BLOOD GAS, VENOUS  TROPONIN I (HIGH SENSITIVITY)   CRITICAL CARE Performed by:  Johnathan E Shiva Karis   Total critical care time: 30 minutes  Critical care time was exclusive of separately billable procedures and treating other patients.  Critical care was necessary to treat or prevent imminent or life-threatening deterioration.  Critical care was time spent personally by me on the following activities: development of treatment plan with  patient and/or surrogate as well as nursing, discussions with consultants, evaluation of patient's response to treatment, examination of patient, obtaining history from patient or surrogate, ordering and performing treatments and interventions, ordering and review of laboratory studies, ordering and review of radiographic studies, pulse oximetry and re-evaluation of patient's condition.  RADIOLOGY  Images were viewed by me Chest x-ray IMPRESSION: COPD and bibasilar scarring.  No other focal abnormality is noted.  DIFFERENTIAL DIAGNOSIS  CHF, COPD, pneumonia, COVID-19, unstable angina, MI  ASSESSMENT AND PLAN  COPD exacerbation   Plan: The patient had presented for difficulty breathing but also chest pain.  He was given duo nebs and had already received Solu-Medrol.  He received Percocet for his chronic pain.  Patient's labs did not reveal any acute process.  DuoNebs did not seem to significantly improve his breathing, we placed him on a continuous nebulizer treatment and he did require some additional nasal cannula oxygen.  Overall he has not improved enough to be discharged home.  I will discuss with the hospitalist for admission.  Daryel November MD    Note: This note was generated in part or whole with voice recognition software. Voice recognition is usually quite accurate but there are transcription errors that can and very often do occur. I apologize for any typographical errors that were not detected and corrected.     Emily Filbert, MD 06/09/19 2206

## 2019-06-09 NOTE — ED Triage Notes (Signed)
Pt presents from [facility] via [facility] with c/o difficulty breathing and chest pain. Pt with dry cough at this time. Pt has hx of copd, on 2L chronically. 125 mg solumedrol given by ems. 85% on room air, 96% on 6L. Pt currently alert at this time. CBG 333 for ems.

## 2019-06-09 NOTE — H&P (Signed)
History and Physical    Gregory Hall [id number] DOB: 02/23/1955 DOA: 06/09/2019  PCP: Armando Gang, FNP   Patient coming from: Home  I have personally briefly reviewed patient's old medical records in [facility]  Chief Complaint: Shortness of breath, wheezing, chest pain  HPI: Gregory Hall is a 64 y.o. male with medical history significant for COPD on home O2 at 2 L, CAD with history of PCI of the LAD in 2010, last stress test 2017 low risk, chronic pain as well as history of anxiety on anxiolytics, who presents to the emergency room with a 1-day history of wheezing, not improving with home bronchodilator therapy.  He has associated chest congestion and initially on retrosternal chest tightness of moderate intensity, nonradiating.  Has no associated nausea vomiting diaphoresis, lightheadedness or palpitations.  Denies cough fever or chills.  ED Course: On arrival, patient was afebrile, tachycardic to 24 with O2 sat 88% on home flow rate of 2 L improving to the mid 90s on 4 L.  Vitals were otherwise unremarkable.  Troponin was 5 with a BNP of 51.  Venous blood gas showed PCO2 of 68.[name] cell count normal at 8600.  Noted to have an elevated blood sugar of 272 with no known history of diabetes.  Chest x-ray showed no acute disease.  Patient was treated with several rounds of DuoNeb but continued to have increased work of breathing.  Hospitalist consulted for admission.  Review of Systems: As per HPI otherwise 10 point review of systems negative.    Past Medical History:  Diagnosis Date  . CAD (coronary artery disease)    s/p PTCA and stent x2  . Chest pain   . Chronic pain syndrome   . Degenerative cervical disc   . Dyslipidemia   . GERD (gastroesophageal reflux disease)   . Hernia 2014  . Hypertension   . MRSA (methicillin resistant staph aureus) culture positive 2011  . Neuropathy   . Nutcracker esophagus   . Rectus diastasis 07/19/2012    Past  Surgical History:  Procedure Laterality Date  . BACK SURGERY  2012  . CHOLECYSTECTOMY    . COLONOSCOPY  Jan 2014   [name]  . CORONARY ANGIOPLASTY WITH STENT PLACEMENT  2009   stents x2, in [place], [place]  . FOOT SURGERY     Right  . NECK SURGERY    . SPINE SURGERY  2012,2013  . TONSILLECTOMY       reports that he has been smoking. He has a 15.00 pack-year smoking history. He has never used smokeless tobacco. He reports that he does not drink alcohol or use drugs.  Allergies  Allergen Reactions  . Acetaminophen Other (See Comments)    Reaction:  Unknown  Other reaction(s): Other (See Comments), Other (See Comments) Reaction: Unknown  Kidney failure Told not to take from home M.D. Related to kidney and renal failure   . Nsaids Other (See Comments)    Reaction:  Unknown  Other reaction(s): Other (See Comments), Other (See Comments) Reaction: Unknown  Kidney failure Patient states not to take from home M.D. Related to kidney and renal failure.  Other reaction(s): Unknown    Family History  Family history unknown: Yes     Prior to Admission medications   Medication Sig Start Date End Date Taking? Authorizing Provider  albuterol (PROVENTIL HFA;VENTOLIN HFA) 108 (90 Base) MCG/ACT inhaler Inhale 2 puffs into the lungs every 6 (six) hours as needed for wheezing or shortness of  breath.    [provider]  aspirin EC 81 MG tablet Take 81 mg by mouth daily.    [provider]  atorvastatin (LIPITOR) 20 MG tablet Take 1 tablet (20 mg total) by mouth daily. 12/10/14   Iran Ouch, MD  budesonide-formoterol (SYMBICORT) 160-4.5 MCG/ACT inhaler Inhale 2 puffs into the lungs 2 (two) times daily.    [provider]  busPIRone (BUSPAR) 10 MG tablet Take 10 mg by mouth 2 (two) times daily.    [provider]  calcium carbonate (OSCAL) 1500 (600 Ca) MG TABS tablet Take 600 mg of elemental calcium by mouth 2 (two) times daily with a meal.     [provider]  Cholecalciferol (VITAMIN D3) 5000 units CAPS Take 5,000 Units by mouth daily.    [provider]  citalopram (CELEXA) 40 MG tablet Take 40 mg by mouth daily.    [provider]  cyclobenzaprine (FLEXERIL) 5 MG tablet Take 1 tablet (5 mg total) by mouth 3 (three) times daily as needed. 05/25/19   Menshew, Charlesetta Ivory, PA-C  diazepam (VALIUM) 5 MG tablet Take 5 mg by mouth 2 (two) times daily.    [provider]  doxycycline (VIBRA-TABS) 100 MG tablet Take 1 tablet (100 mg total) by mouth 2 (two) times daily. 10/07/15   Cuthriell, Delorise Royals, PA-C  furosemide (LASIX) 20 MG tablet Take 20 mg by mouth daily.     [provider]  hydroxypropyl methylcellulose / hypromellose (ISOPTO TEARS / GONIOVISC) 2.5 % ophthalmic solution Place 1-2 drops into both eyes every 6 (six) hours as needed for dry eyes.    [provider]  Linaclotide Karlene Einstein) 145 MCG CAPS capsule Take 145 mcg by mouth daily.    [provider]  magnesium hydroxide (MILK OF MAGNESIA) 400 MG/5ML suspension Take 30 mLs by mouth daily as needed for mild constipation.    [provider]  metFORMIN (GLUCOPHAGE) 500 MG tablet Take 500 mg by mouth 2 (two) times daily before a meal.     [provider]  metoprolol succinate (TOPROL-XL) 25 MG 24 hr tablet Take 1 tablet (25 mg total) by mouth daily. Take with or immediately following a meal. 02/23/15   Iran Ouch, MD  mupirocin cream (BACTROBAN) 2 % Apply to affected area 3 times daily 10/07/15   Cuthriell, Delorise Royals, PA-C  Naloxone HCl (EVZIO) 0.4 MG/0.4ML SOAJ Inject 1 Dose as directed once as needed (for opioid overdose).    [provider]  NITROSTAT 0.4 MG SL tablet DISSOLVE (1) TABLET UNDER TONGUE AS NEEDED TO RELIEVE CHEST PAIN. MAYREPEAT EVERY 5 MINUTES. 10/06/15   Iran Ouch, MD  oxycodone (ROXICODONE) 30 MG immediate release tablet Take 30 mg by mouth every 4 (four) hours as  needed for pain.     [provider]  pantoprazole (PROTONIX) 40 MG tablet Take 40 mg by mouth 2 (two) times daily.     [provider]  polyethylene glycol (MIRALAX / GLYCOLAX) packet Take 17 g by mouth daily as needed for mild constipation.     [provider]  potassium chloride (K-DUR) 10 MEQ tablet Take 10 mEq by mouth 2 (two) times daily.    [provider]  predniSONE (DELTASONE) 50 MG tablet Take 1 tablet (50 mg total) by mouth daily with breakfast. 10/07/15   Cuthriell, Delorise Royals, PA-C  promethazine (PHENERGAN) 25 MG tablet Take 25 mg by mouth 2 (two) times daily as needed for  nausea or vomiting.     [provider]  senna-docusate (SENOKOT-S) 8.6-50 MG tablet Take 1 tablet by mouth 2 (two) times daily.    [provider]  tamsulosin (FLOMAX) 0.4 MG CAPS capsule Take 0.4 mg by mouth daily.     [provider]  tiotropium (SPIRIVA) 18 MCG inhalation capsule Place 18 mcg into inhaler and inhale daily.    [provider]  traZODone (DESYREL) 100 MG tablet Take 200 mg by mouth at bedtime.    [provider]    Physical Exam: Vitals:   06/09/19 2118 06/09/19 2130 06/09/19 2200 06/09/19 2215  BP: (!) 182/76 (!) 141/78 (!) 144/68   Pulse: 97 89 94   Resp: (!) 24 (!) 21 (!) 24   Temp: 98 F (36.7 C)     TempSrc: Oral     SpO2: 93% 92% (!) 88% 90%  Weight:      Height:         Vitals:   06/09/19 2118 06/09/19 2130 06/09/19 2200 06/09/19 2215  BP: (!) 182/76 (!) 141/78 (!) 144/68   Pulse: 97 89 94   Resp: (!) 24 (!) 21 (!) 24   Temp: 98 F (36.7 C)     TempSrc: Oral     SpO2: 93% 92% (!) 88% 90%  Weight:      Height:        Constitutional: Alert and oriented x 3 . Mild conversational dyspnea but no acute distress HEENT:      Head: Normocephalic and atraumatic.         Eyes: PERLA, EOMI, Conjunctivae are normal. Sclera is non-icteric.       Mouth/Throat: Mucous membranes are moist.       Neck:  Supple with no signs of meningismus. Cardiovascular: Regular rate and rhythm. No murmurs, gallops, or rubs. 2+ symmetrical distal pulses are present . No JVD. NoLE edema Respiratory: Respiratory effort slightly increased.Lungs sounds diminished bilaterally. Few scattered rhonchi.  Gastrointestinal: Soft, non tender, and non distended with positive bowel sounds. No rebound or guarding. Genitourinary: No CVA tenderness. Musculoskeletal: Nontender with normal range of motion in all extremities. No edema, cyanosis, or erythema of extremities. Neurologic: Normal speech and language. Face is symmetric. Moving all extremities. No gross focal neurologic deficits . Skin: Skin is warm, dry.  No rash or ulcers Psychiatric: Mood and affect are normal Speech and behavior are normal  Labs on Admission: I have personally reviewed following labs and imaging studies  CBC: Recent Labs  Lab 06/09/19 2119  WBC 8.6  NEUTROABS 4.9  HGB 14.0  HCT 44.1  MCV 92.5  PLT 216   Basic Metabolic Panel: Recent Labs  Lab 06/09/19 2119  NA 139  K 4.9  CL 98  CO2 35*  GLUCOSE 270*  BUN 10  CREATININE 1.08  CALCIUM 8.5*   GFR: Estimated Creatinine Clearance: 80.9 mL/min (by C-G formula based on SCr of 1.08 mg/dL). Liver Function Tests: No results for input(s): AST, ALT, ALKPHOS, BILITOT, PROT, ALBUMIN in the last 168 hours. No results for input(s): LIPASE, AMYLASE in the last 168 hours. No results for input(s): AMMONIA in the last 168 hours. Coagulation Profile: No results for input(s): INR, PROTIME in the last 168 hours. Cardiac Enzymes: No results for input(s): CKTOTAL, CKMB, CKMBINDEX, TROPONINI in the last 168 hours. BNP (last 3 results) No results for input(s): PROBNP in the last 8760 hours. HbA1C: No results for input(s): HGBA1C in the last 72 hours. CBG: No results  for input(s): GLUCAP in the last 168 hours. Lipid Profile: No results for input(s): CHOL, HDL, LDLCALC, TRIG, CHOLHDL, LDLDIRECT  in the last 72 hours. Thyroid Function Tests: No results for input(s): TSH, T4TOTAL, FREET4, T3FREE, THYROIDAB in the last 72 hours. Anemia Panel: No results for input(s): VITAMINB12, FOLATE, FERRITIN, TIBC, IRON, RETICCTPCT in the last 72 hours. Urine analysis:    Component Value Date/Time   COLORURINE YELLOW (A) 11/10/2015 1709   APPEARANCEUR CLEAR (A) 11/10/2015 1709   APPEARANCEUR Clear 10/28/2013 2215   LABSPEC 1.016 11/10/2015 1709   LABSPEC 1.002 10/28/2013 2215   PHURINE 6.0 11/10/2015 1709   GLUCOSEU NEGATIVE 11/10/2015 1709   GLUCOSEU Negative 10/28/2013 2215   HGBUR NEGATIVE 11/10/2015 1709   BILIRUBINUR NEGATIVE 11/10/2015 1709   BILIRUBINUR Negative 10/28/2013 2215   KETONESUR TRACE (A) 11/10/2015 1709   PROTEINUR NEGATIVE 11/10/2015 1709   UROBILINOGEN 1.0 03/29/2009 0840   NITRITE NEGATIVE 11/10/2015 1709   LEUKOCYTESUR NEGATIVE 11/10/2015 1709   LEUKOCYTESUR Negative 10/28/2013 2215    Radiological Exams on Admission: DG Chest Port 1 View  Result Date: 06/09/2019 CLINICAL DATA:  Dyspnea EXAM: PORTABLE CHEST 1 VIEW COMPARISON:  05/25/2019 FINDINGS: Cardiac shadow is within normal limits. Aortic calcifications are noted. Spinal stimulator and left subclavian arterial stent are noted. Lungs are hyperinflated consistent with COPD. Bibasilar scarring is noted. No acute infiltrate is noted. IMPRESSION: COPD and bibasilar scarring.  No other focal abnormality is noted. Electronically Signed   By: Inez Catalina M.D.   On: 06/09/2019 21:37    EKG: Independently reviewed.   Assessment/Plan Principal Problem:  # Acute on chronic respiratory failure with hypoxia and hypercapnia (HCC)  # COPD with acute exacerbation ([place]) -Patient presents with several day history of wheezing not responding to home bronchodilator therapy.  O2 sats 88 on home flow rate of 2 L.  Venous PCO2 68 mmHg, venous pH normal.  -Scheduled and as needed bronchodilator -IV steroids to taper to  oral -Supplemental O2 to keep sats over 90%    #Chest pain   # CAD with history of PCI to LAD in 2010 -Suspect noncardiac given normal troponin of 7.  BNP was 51 -Last stress test was 2017 which was low risk.  Was followed by Dr. Fletcher Anon but had moved out of the area for a while -Nitroglycerin sublingual as needed chest pain with morphine for breakthrough -Continue aspirin, atorvastatin and Toprol.    Hyperglycemia -Blood sugar 270 with no history of diabetes(had A1c of 8.3 in 2013) -Follow A1c - insulin sliding scale coverage     Anxiety -Patient noted to be on diazepam, citalopram, BuSpar -.  To resume pending med rec  Chronic pain on chronic opiates -As needed oxycodone  DVT prophylaxis: Lovenox  Code Status: full code  Family Communication:  none  Disposition Plan: Back to previous home environment Consults called: none  Status:obs    Athena Masse MD [facility]     06/09/2019, 10:51 PM

## 2019-06-09 NOTE — ED Notes (Signed)
Pt O2 sats are reading at 88% on 6LPM. Pt in no resp distress. Will call RT at this time

## 2019-06-09 NOTE — ED Notes (Signed)
RT contacted at this time. Pt sats at 90% on 6LPM. Stats to keep him at this for time being due to COPD

## 2019-06-09 NOTE — ED Notes (Signed)
Pt still reporting tightness in chest after completion of breathing tx. Pt respirations still appear to be labored at this time. Pt saturation 88% on 2L. MD Mayford Knife to bedside at this time. Pt oxygen increased to 4L and respiratory paged at this time.

## 2019-06-10 ENCOUNTER — Other Ambulatory Visit: Payer: Self-pay

## 2019-06-10 ENCOUNTER — Encounter: Payer: Self-pay | Admitting: Internal Medicine

## 2019-06-10 DIAGNOSIS — Z8614 Personal history of Methicillin resistant Staphylococcus aureus infection: Secondary | ICD-10-CM | POA: Diagnosis not present

## 2019-06-10 DIAGNOSIS — I2510 Atherosclerotic heart disease of native coronary artery without angina pectoris: Secondary | ICD-10-CM | POA: Diagnosis present

## 2019-06-10 DIAGNOSIS — K224 Dyskinesia of esophagus: Secondary | ICD-10-CM | POA: Diagnosis present

## 2019-06-10 DIAGNOSIS — G894 Chronic pain syndrome: Secondary | ICD-10-CM | POA: Diagnosis present

## 2019-06-10 DIAGNOSIS — Z79899 Other long term (current) drug therapy: Secondary | ICD-10-CM | POA: Diagnosis not present

## 2019-06-10 DIAGNOSIS — E785 Hyperlipidemia, unspecified: Secondary | ICD-10-CM | POA: Diagnosis present

## 2019-06-10 DIAGNOSIS — Z9981 Dependence on supplemental oxygen: Secondary | ICD-10-CM | POA: Diagnosis not present

## 2019-06-10 DIAGNOSIS — G629 Polyneuropathy, unspecified: Secondary | ICD-10-CM | POA: Diagnosis present

## 2019-06-10 DIAGNOSIS — Z955 Presence of coronary angioplasty implant and graft: Secondary | ICD-10-CM | POA: Diagnosis not present

## 2019-06-10 DIAGNOSIS — Z20822 Contact with and (suspected) exposure to covid-19: Secondary | ICD-10-CM | POA: Diagnosis present

## 2019-06-10 DIAGNOSIS — Z7952 Long term (current) use of systemic steroids: Secondary | ICD-10-CM | POA: Diagnosis not present

## 2019-06-10 DIAGNOSIS — F17210 Nicotine dependence, cigarettes, uncomplicated: Secondary | ICD-10-CM | POA: Diagnosis present

## 2019-06-10 DIAGNOSIS — J9621 Acute and chronic respiratory failure with hypoxia: Secondary | ICD-10-CM | POA: Diagnosis present

## 2019-06-10 DIAGNOSIS — E1165 Type 2 diabetes mellitus with hyperglycemia: Secondary | ICD-10-CM | POA: Diagnosis present

## 2019-06-10 DIAGNOSIS — IMO0002 Reserved for concepts with insufficient information to code with codable children: Secondary | ICD-10-CM | POA: Diagnosis present

## 2019-06-10 DIAGNOSIS — G4700 Insomnia, unspecified: Secondary | ICD-10-CM | POA: Diagnosis present

## 2019-06-10 DIAGNOSIS — I10 Essential (primary) hypertension: Secondary | ICD-10-CM | POA: Diagnosis present

## 2019-06-10 DIAGNOSIS — E118 Type 2 diabetes mellitus with unspecified complications: Secondary | ICD-10-CM

## 2019-06-10 DIAGNOSIS — F419 Anxiety disorder, unspecified: Secondary | ICD-10-CM

## 2019-06-10 DIAGNOSIS — Z7951 Long term (current) use of inhaled steroids: Secondary | ICD-10-CM | POA: Diagnosis not present

## 2019-06-10 DIAGNOSIS — J9811 Atelectasis: Secondary | ICD-10-CM | POA: Diagnosis present

## 2019-06-10 DIAGNOSIS — Z7982 Long term (current) use of aspirin: Secondary | ICD-10-CM | POA: Diagnosis not present

## 2019-06-10 DIAGNOSIS — J9622 Acute and chronic respiratory failure with hypercapnia: Secondary | ICD-10-CM | POA: Diagnosis present

## 2019-06-10 DIAGNOSIS — J441 Chronic obstructive pulmonary disease with (acute) exacerbation: Principal | ICD-10-CM | POA: Diagnosis present

## 2019-06-10 DIAGNOSIS — Z79891 Long term (current) use of opiate analgesic: Secondary | ICD-10-CM | POA: Diagnosis not present

## 2019-06-10 DIAGNOSIS — K219 Gastro-esophageal reflux disease without esophagitis: Secondary | ICD-10-CM | POA: Diagnosis present

## 2019-06-10 LAB — CBC WITH DIFFERENTIAL/PLATELET
Abs Immature Granulocytes: 0.04 10*3/uL (ref 0.00–0.07)
Basophils Absolute: 0 10*3/uL (ref 0.0–0.1)
Basophils Relative: 0 %
Eosinophils Absolute: 0 10*3/uL (ref 0.0–0.5)
Eosinophils Relative: 0 %
HCT: 44 % (ref 39.0–52.0)
Hemoglobin: 14 g/dL (ref 13.0–17.0)
Immature Granulocytes: 0 %
Lymphocytes Relative: 4 %
Lymphs Abs: 0.4 10*3/uL — ABNORMAL LOW (ref 0.7–4.0)
MCH: 29.1 pg (ref 26.0–34.0)
MCHC: 31.8 g/dL (ref 30.0–36.0)
MCV: 91.5 fL (ref 80.0–100.0)
Monocytes Absolute: 0.1 10*3/uL (ref 0.1–1.0)
Monocytes Relative: 1 %
Neutro Abs: 8.8 10*3/uL — ABNORMAL HIGH (ref 1.7–7.7)
Neutrophils Relative %: 95 %
Platelets: 220 10*3/uL (ref 150–400)
RBC: 4.81 MIL/uL (ref 4.22–5.81)
RDW: 15.9 % — ABNORMAL HIGH (ref 11.5–15.5)
WBC: 9.3 10*3/uL (ref 4.0–10.5)
nRBC: 0 % (ref 0.0–0.2)

## 2019-06-10 LAB — COMPREHENSIVE METABOLIC PANEL
ALT: 12 U/L (ref 0–44)
AST: 17 U/L (ref 15–41)
Albumin: 3.1 g/dL — ABNORMAL LOW (ref 3.5–5.0)
Alkaline Phosphatase: 80 U/L (ref 38–126)
Anion gap: 9 (ref 5–15)
BUN: 14 mg/dL (ref 8–23)
CO2: 33 mmol/L — ABNORMAL HIGH (ref 22–32)
Calcium: 8.6 mg/dL — ABNORMAL LOW (ref 8.9–10.3)
Chloride: 94 mmol/L — ABNORMAL LOW (ref 98–111)
Creatinine, Ser: 1.04 mg/dL (ref 0.61–1.24)
GFR calc Af Amer: >60 mL/min (ref >60)
GFR calc non Af Amer: >60 mL/min (ref >60)
Glucose, Bld: 370 mg/dL — ABNORMAL HIGH (ref 70–99)
Potassium: 4.8 mmol/L (ref 3.5–5.1)
Sodium: 136 mmol/L (ref 135–145)
Total Bilirubin: 0.5 mg/dL (ref 0.3–1.2)
Total Protein: 6 g/dL — ABNORMAL LOW (ref 6.5–8.1)

## 2019-06-10 LAB — HEMOGLOBIN A1C
Hgb A1c MFr Bld: 7.9 % — ABNORMAL HIGH (ref 4.8–5.6)
Mean Plasma Glucose: 180.03 mg/dL

## 2019-06-10 LAB — PROCALCITONIN: Procalcitonin: <0.1 ng/mL

## 2019-06-10 LAB — LACTIC ACID, PLASMA
Lactic Acid, Venous: 1.8 mmol/L (ref 0.5–1.9)
Lactic Acid, Venous: 1.5 mmol/L (ref 0.5–1.9)

## 2019-06-10 LAB — TROPONIN I (HIGH SENSITIVITY)
Troponin I (High Sensitivity): 13 ng/L (ref <18)
Troponin I (High Sensitivity): 11 ng/L (ref <18)

## 2019-06-10 LAB — LIPID PANEL
Cholesterol: 110 mg/dL (ref 0–200)
HDL: 44 mg/dL (ref >40)
LDL Cholesterol: 53 mg/dL (ref 0–99)
Total CHOL/HDL Ratio: 2.5 RATIO
Triglycerides: 65 mg/dL (ref <150)
VLDL: 13 mg/dL (ref 0–40)

## 2019-06-10 LAB — HIV ANTIBODY (ROUTINE TESTING W REFLEX): HIV Screen 4th Generation wRfx: NONREACTIVE

## 2019-06-10 LAB — GLUCOSE, CAPILLARY
Glucose-Capillary: 355 mg/dL — ABNORMAL HIGH (ref 70–99)
Glucose-Capillary: 359 mg/dL — ABNORMAL HIGH (ref 70–99)
Glucose-Capillary: 239 mg/dL — ABNORMAL HIGH (ref 70–99)
Glucose-Capillary: 283 mg/dL — ABNORMAL HIGH (ref 70–99)

## 2019-06-10 LAB — MAGNESIUM: Magnesium: 1.9 mg/dL (ref 1.7–2.4)

## 2019-06-10 LAB — PHOSPHORUS: Phosphorus: 3.8 mg/dL (ref 2.5–4.6)

## 2019-06-10 LAB — MRSA PCR SCREENING: MRSA by PCR: POSITIVE — AB

## 2019-06-10 MED ORDER — INSULIN ASPART 100 UNIT/ML ~~LOC~~ SOLN
4.0000 [IU] | Freq: Three times a day (TID) | SUBCUTANEOUS | Status: DC
  Administered 2019-06-10 – 2019-06-12 (×6): 4 [IU] via SUBCUTANEOUS
  Filled 2019-06-10 (×6): qty 1

## 2019-06-10 MED ORDER — LAMOTRIGINE 25 MG PO TABS
100.0000 mg | ORAL_TABLET | Freq: Every day | ORAL | Status: DC
  Administered 2019-06-10 – 2019-06-14 (×5): 100 mg via ORAL
  Filled 2019-06-10 (×5): qty 4

## 2019-06-10 MED ORDER — NICOTINE 21 MG/24HR TD PT24
21.0000 mg | MEDICATED_PATCH | Freq: Every day | TRANSDERMAL | Status: DC
  Administered 2019-06-10 – 2019-06-13 (×4): 21 mg via TRANSDERMAL
  Filled 2019-06-10 (×4): qty 1

## 2019-06-10 MED ORDER — OXYCODONE HCL 5 MG PO TABS
5.0000 mg | ORAL_TABLET | Freq: Once | ORAL | Status: AC
  Administered 2019-06-10: 5 mg via ORAL
  Filled 2019-06-10: qty 1

## 2019-06-10 MED ORDER — INSULIN GLARGINE 100 UNIT/ML ~~LOC~~ SOLN
26.0000 [IU] | Freq: Every day | SUBCUTANEOUS | Status: DC
  Administered 2019-06-10 – 2019-06-13 (×4): 26 [IU] via SUBCUTANEOUS
  Filled 2019-06-10 (×5): qty 0.26

## 2019-06-10 MED ORDER — DULOXETINE HCL 60 MG PO CPEP
60.0000 mg | ORAL_CAPSULE | Freq: Every day | ORAL | Status: DC
  Administered 2019-06-11 – 2019-06-14 (×4): 60 mg via ORAL
  Filled 2019-06-10 (×4): qty 1

## 2019-06-10 MED ORDER — ONDANSETRON HCL 4 MG/2ML IJ SOLN
4.0000 mg | Freq: Four times a day (QID) | INTRAMUSCULAR | Status: DC | PRN
  Administered 2019-06-10 – 2019-06-11 (×3): 4 mg via INTRAVENOUS
  Filled 2019-06-10 (×3): qty 2

## 2019-06-10 MED ORDER — METHYLPREDNISOLONE SODIUM SUCC 125 MG IJ SOLR
60.0000 mg | Freq: Two times a day (BID) | INTRAMUSCULAR | Status: DC
  Administered 2019-06-11: 60 mg via INTRAVENOUS
  Filled 2019-06-10: qty 2

## 2019-06-10 MED ORDER — SODIUM CHLORIDE 0.9 % IV SOLN
2.0000 g | INTRAVENOUS | Status: DC
  Administered 2019-06-10 – 2019-06-12 (×3): 2 g via INTRAVENOUS
  Filled 2019-06-10: qty 20
  Filled 2019-06-10: qty 2
  Filled 2019-06-10 (×2): qty 20

## 2019-06-10 MED ORDER — OXYCODONE HCL 5 MG PO TABS
10.0000 mg | ORAL_TABLET | Freq: Four times a day (QID) | ORAL | Status: DC | PRN
  Administered 2019-06-10 – 2019-06-11 (×4): 10 mg via ORAL
  Filled 2019-06-10 (×4): qty 2

## 2019-06-10 MED ORDER — CHLORHEXIDINE GLUCONATE CLOTH 2 % EX PADS
6.0000 | MEDICATED_PAD | Freq: Every day | CUTANEOUS | Status: DC
  Administered 2019-06-10 – 2019-06-13 (×3): 6 via TOPICAL

## 2019-06-10 MED ORDER — MUPIROCIN 2 % EX OINT
1.0000 "application " | TOPICAL_OINTMENT | Freq: Two times a day (BID) | CUTANEOUS | Status: DC
  Administered 2019-06-10 – 2019-06-14 (×9): 1 via NASAL
  Filled 2019-06-10 (×2): qty 22

## 2019-06-10 MED ORDER — ASPIRIN EC 81 MG PO TBEC
81.0000 mg | DELAYED_RELEASE_TABLET | Freq: Every day | ORAL | Status: DC
  Administered 2019-06-10 – 2019-06-14 (×5): 81 mg via ORAL
  Filled 2019-06-10 (×5): qty 1

## 2019-06-10 MED ORDER — PREGABALIN 75 MG PO CAPS
200.0000 mg | ORAL_CAPSULE | Freq: Three times a day (TID) | ORAL | Status: DC
  Administered 2019-06-10 – 2019-06-14 (×12): 200 mg via ORAL
  Filled 2019-06-10 (×12): qty 2

## 2019-06-10 MED ORDER — SODIUM CHLORIDE 0.9 % IV SOLN
INTRAVENOUS | Status: DC | PRN
  Administered 2019-06-10: 1000 mL via INTRAVENOUS

## 2019-06-10 MED ORDER — INSULIN ASPART 100 UNIT/ML ~~LOC~~ SOLN
0.0000 [IU] | SUBCUTANEOUS | Status: DC
  Administered 2019-06-10: 8 [IU] via SUBCUTANEOUS
  Administered 2019-06-11 (×2): 5 [IU] via SUBCUTANEOUS
  Administered 2019-06-11: 3 [IU] via SUBCUTANEOUS
  Administered 2019-06-11 (×2): 5 [IU] via SUBCUTANEOUS
  Administered 2019-06-11: 11 [IU] via SUBCUTANEOUS
  Administered 2019-06-12: 3 [IU] via SUBCUTANEOUS
  Administered 2019-06-12: 8 [IU] via SUBCUTANEOUS
  Administered 2019-06-12: 5 [IU] via SUBCUTANEOUS
  Administered 2019-06-12: 11 [IU] via SUBCUTANEOUS
  Filled 2019-06-10 (×10): qty 1

## 2019-06-10 MED ORDER — GUAIFENESIN ER 600 MG PO TB12
600.0000 mg | ORAL_TABLET | Freq: Two times a day (BID) | ORAL | Status: DC
  Administered 2019-06-10: 600 mg via ORAL
  Filled 2019-06-10: qty 1

## 2019-06-10 MED ORDER — SODIUM CHLORIDE 0.9 % IV SOLN
500.0000 mg | INTRAVENOUS | Status: DC
  Administered 2019-06-10 – 2019-06-12 (×3): 500 mg via INTRAVENOUS
  Filled 2019-06-10 (×4): qty 500

## 2019-06-10 MED ORDER — ATORVASTATIN CALCIUM 20 MG PO TABS
80.0000 mg | ORAL_TABLET | Freq: Every day | ORAL | Status: DC
  Administered 2019-06-10 – 2019-06-13 (×4): 80 mg via ORAL
  Filled 2019-06-10 (×4): qty 4

## 2019-06-10 MED ORDER — RIVAROXABAN 20 MG PO TABS
20.0000 mg | ORAL_TABLET | Freq: Every day | ORAL | Status: DC
  Administered 2019-06-10 – 2019-06-13 (×4): 20 mg via ORAL
  Filled 2019-06-10 (×5): qty 1

## 2019-06-10 NOTE — Care Management Obs Status (Signed)
MEDICARE OBSERVATION STATUS NOTIFICATION   Patient Details  Name: Glendal Cassaday MRN: [id number] Date of Birth: August 22, 1955   Medicare Observation Status Notification Given:  Yes    Jaelan Rasheed Clementeen Hoof, RN 06/10/2019, 3:12 PM

## 2019-06-10 NOTE — TOC Initial Note (Signed)
Transition of Care [facility]) - Initial/Assessment Note    Patient Details  Name: Gregory Hall MRN: [id number] Date of Birth: Dec 11, 1955  [facility] CM/SW Contact:    Su Hilt, RN Phone Number: 06/10/2019, 3:14 PM  Clinical Narrative:                  Met with the patient to discuss DC plan and needs [facility], The plan is for him to return He has a wheelchair and 3 in 1 at the home and he has oxygen that he uses chronically at 2 liters He asked about a shower chair, I called Zack with [employer] to check on it and the patient's insurance does not cover it, I asked the patient if he would be interested in private paying for it and he said not to order it, He does not need Home health services and stated that he gets up from the bed to the Wheelchair He has no additional needs at this time Expected Discharge Plan: Group Home Barriers to Discharge: Continued Medical Work up   Patient Goals and CMS Choice Patient states their goals for this hospitalization and ongoing recovery are:: go back to [facility] Group home      Expected Discharge Plan and Services Expected Discharge Plan: Group Home   Discharge Planning Services: CM Consult   Living arrangements for the past 2 months: Group Home                 DME Arranged: N/A         HH Arranged: NA          Prior Living Arrangements/Services Living arrangements for the past 2 months: Group Home Lives with:: Facility Resident Patient language and need for interpreter reviewed:: Yes        Need for Family Participation in Patient Care: No (Comment) Care giver support system in place?: Yes (comment) Current home services: DME(wheelchair, Bedside commode 3 in 1, Oxygen) Criminal Activity/Legal Involvement Pertinent to Current Situation/Hospitalization: No - Comment as needed  Activities of Daily Living Home Assistive Devices/Equipment: Walker (specify type), Oxygen ADL  Screening (condition at time of admission) Patient's cognitive ability adequate to safely complete daily activities?: Yes Is the patient deaf or have difficulty hearing?: No Does the patient have difficulty seeing, even when wearing glasses/contacts?: No Does the patient have difficulty concentrating, remembering, or making decisions?: No Patient able to express need for assistance with ADLs?: Yes Does the patient have difficulty dressing or bathing?: Yes Independently performs ADLs?: No Communication: Independent Dressing (OT): Needs assistance Is this a change from baseline?: Change from baseline, expected to last <3days Grooming: Needs assistance Is this a change from baseline?: Change from baseline, expected to last <3 days Feeding: Independent Bathing: Needs assistance Is this a change from baseline?: Change from baseline, expected to last <3 days Toileting: Needs assistance Is this a change from baseline?: Change from baseline, expected to last <3 days In/Out Bed: Needs assistance Is this a change from baseline?: Change from baseline, expected to last <3 days Walks in Home: Needs assistance Is this a change from baseline?: Change from baseline, expected to last <3 days Does the patient have difficulty walking or climbing stairs?: Yes Weakness of Legs: Both Weakness of Arms/Hands: None  Permission Sought/Granted                  Emotional Assessment Appearance:: Appears stated age Attitude/Demeanor/Rapport: Engaged Affect (  typically observed): Appropriate Orientation: : Oriented to  Time, Oriented to Situation, Oriented to Place, Oriented to Self Alcohol / Substance Use: Not Applicable Psych Involvement: No (comment)  Admission diagnosis:  COPD exacerbation ([place]) [J44.1] COPD with acute exacerbation ([place]) [J44.1] Patient Active Problem List   Diagnosis Date Noted  . Diabetes mellitus type 2, uncontrolled, with complications ([place]) 85/02/7739  . Chronic pain syndrome  06/10/2019  . COPD with acute exacerbation ([place]) 06/09/2019  . CAD (coronary artery disease) 06/09/2019  . HTN (hypertension) 06/09/2019  . Acute on chronic respiratory failure with hypoxia and hypercapnia ([place]) 06/09/2019  . Anxiety 06/09/2019  . Chronic prescription opiate use 06/09/2019  . Hyperglycemia 06/09/2019  . Subclavian artery stenosis, left ([place]) 08/20/2014  . Rectus diastasis 07/19/2012  . Hernia   . Edema 05/10/2011  . Preoperative cardiovascular examination 10/06/2010  . SMOKER 11/25/2009  . CAROTID BRUIT, RIGHT 11/24/2009  . Chest pain 11/24/2009  . Hyperlipidemia 03/30/2009  . Coronary atherosclerosis 03/30/2009  . HYPERTENSION, HX OF 03/30/2009   PCP:  Remi Haggard, FNP Pharmacy:   [place], [place] - [address] [place] [address] Phone: [phone] Fax: [phone]     Social Determinants of Health (SDOH) Interventions    Readmission Risk Interventions No flowsheet data found.

## 2019-06-10 NOTE — Evaluation (Signed)
Occupational Therapy Evaluation Patient Details Name: Gregory Hall MRN: [id number] DOB: [birth year] Today's Date: 06/10/2019    History of Present Illness Gregory Hall is a 64 y.o. male with medical history significant for COPD on home O2 at 2 L, CAD, chronic pain as well as history of anxiety on anxiolytics, who presents to the emergency room with a 1-day history of wheezing, not improving with home bronchodilator therapy.   Clinical Impression   Mr. Gregory Hall was seen for OT evaluation this date. Prior to admission, pt resided in an assisted living facility where he reports staff generally assist him with IADL management including medication management and meal prep. He is generally independent with BADL management. At baseline, pt reports he performs stand pivot transfers to his [facility] which he typically propels with his feet to move throughout his environment. Pt on 2 liters of O2 at home. Pt reports becoming easily fatigued or out of breath with minimal exertion recently. Pt currently requires supervision for safety and MIN A for LB ADL management including bathing and dressing due to current functional impairments (See OT Problem List below). Pt educated in energy conservation strategies including pursed lip breathing, activity pacing, home/routines modifications, work simplification, AE/DME, prioritizing of meaningful occupations, and falls prevention. Handout provided. Pt verbalized understanding and would benefit from additional skilled OT services to maximize recall and carryover of learned techniques and facilitate implementation of learned techniques into daily routines. Upon discharge, recommend [place] services.       Follow Up Recommendations  Home health OT    Equipment Recommendations  Tub/shower bench    Recommendations for Other Services       Precautions / Restrictions Precautions Precautions: Fall Restrictions Weight Bearing Restrictions: No      Mobility Bed  Mobility Overal bed mobility: Needs Assistance             General bed mobility comments: Deferred for pt comfort. Per physical therapist who saw pt just prior to OT eval, pt performs bed mobility and stand privot transfer to/from room recliner with supervision for safety.  Transfers                      Balance Overall balance assessment: Mild deficits observed, not formally tested                                         ADL either performed or assessed with clinical judgement   ADL Overall ADL's : Needs assistance/impaired                                       General ADL Comments: Pt functionally limited by generalized weakness, decreased activity tolerance and cardiopulmonary status. Requires supervision for safety during functional transfers MIN A for LB ADL management including bathing and dressing. Pt would benefit from further education in energy conservation strategies for improved safety and functional independence upon hospital DC.     Vision Baseline Vision/History: Wears glasses Wears Glasses: At all times Patient Visual Report: No change from baseline       Perception     Praxis      Pertinent Vitals/Pain Pain Assessment: 0-10 Pain Score: 8  Pain Location: Generalized but most severe in neck/back Pain Descriptors / Indicators: Constant Pain Intervention(s): Limited activity within patient's  tolerance;Monitored during session;Premedicated before session;Utilized relaxation techniques     Hand Dominance Right   Extremity/Trunk Assessment Upper Extremity Assessment Upper Extremity Assessment: Generalized weakness   Lower Extremity Assessment Lower Extremity Assessment: Generalized weakness   Cervical / Trunk Assessment Cervical / Trunk Assessment: Normal   Communication Communication Communication: No difficulties   Cognition Arousal/Alertness: Awake/alert Behavior During Therapy: WFL for tasks  assessed/performed Overall Cognitive Status: Within Functional Limits for tasks assessed                                     General Comments       Exercises Other Exercises Other Exercises: Pt educated on role of OT in acute setting, & energy conservation strategies including safe use of AE/DME for ADL management, pursed lip breathing (PLB) techniques, prioritizing meaningful occupations, and routines modifications to support safety and functional independence upon hospital DC.   Shoulder Instructions      Home Living Family/patient expects to be discharged to:: Assisted living                             Home Equipment: Bedside commode;Wheelchair - manual;Hand held shower head;Grab bars - tub/shower          Prior Functioning/Environment Level of Independence: Independent with assistive device(s)        Comments: Pt reports he is generally independent with BADL management. He is able to stand pivot to/from his WC w/o assist. Manages dressing and bathing tasks independently. ALF staff support meal prep, medication management, and can provide physical assist PRN.        OT Problem List: Decreased strength;Decreased coordination;Decreased activity tolerance;Decreased safety awareness;Impaired balance (sitting and/or standing);Decreased knowledge of use of DME or AE;Impaired UE functional use;Cardiopulmonary status limiting activity      OT Treatment/Interventions: Self-care/ADL training;Therapeutic exercise;Therapeutic activities;DME and/or AE instruction;Patient/family education;Balance training;Energy conservation    OT Goals(Current goals can be found in the care plan section) Acute Rehab OT Goals Patient Stated Goal: To have less pain OT Goal Formulation: With patient Time For Goal Achievement: 06/24/19 Potential to Achieve Goals: Good ADL Goals Pt Will Perform Grooming: sitting;with modified independence(c LRAD PRN for improved safety and fxl  independence.) Pt Will Transfer to Toilet: bedside commode;with set-up;with supervision(c LRAD PRN for improved safety and fxl independence.) Pt Will Perform Toileting - Clothing Manipulation and hygiene: sitting/lateral leans;with set-up;with supervision(c LRAD PRN for improved safety and fxl independence.) Additional ADL Goal #1: Pt will independently verbalize a plan to implement at least 3 learned energy conservation strategies into his daily routines/home environment for improved safety and functional independence upon hospital DC.  OT Frequency: Min 1X/week   Barriers to D/C:            Co-evaluation              AM-PAC OT "6 Clicks" Daily Activity     Outcome Measure Help from another person eating meals?: A Little Help from another person taking care of personal grooming?: A Little Help from another person toileting, which includes using toliet, bedpan, or urinal?: A Little Help from another person bathing (including washing, rinsing, drying)?: A Little Help from another person to put on and taking off regular upper body clothing?: A Little Help from another person to put on and taking off regular lower body clothing?: A Little 6 Click Score: 18   End  of Session    Activity Tolerance: Patient limited by pain Patient left: in bed;with call bell/phone within reach;with bed alarm set  OT Visit Diagnosis: Other abnormalities of gait and mobility (R26.89);Muscle weakness (generalized) (M62.81)                Time: 0347-4259 OT Time Calculation (min): 20 min Charges:  OT General Charges $OT Visit: 1 Visit OT Evaluation $OT Eval Moderate Complexity: 1 Mod OT Treatments $Self Care/Home Management : 8-22 mins  Rockney Ghee, M.S., OTR/L Ascom: [phone] 06/10/19, 4:07 PM

## 2019-06-10 NOTE — ED Notes (Signed)
Pt in bed at this time watching TV, milk and crackers provided per request

## 2019-06-10 NOTE — Plan of Care (Signed)
  Problem: Health Behavior/Discharge Planning: Goal: Ability to manage health-related needs will improve Outcome: Progressing   Problem: Clinical Measurements: Goal: Ability to maintain clinical measurements within normal limits will improve Outcome: Progressing Goal: Will remain free from infection Outcome: Progressing Goal: Diagnostic test results will improve Outcome: Progressing Goal: Cardiovascular complication will be avoided Outcome: Progressing   Problem: Activity: Goal: Risk for activity intolerance will decrease Outcome: Progressing   Problem: Nutrition: Goal: Adequate nutrition will be maintained Outcome: Progressing   Problem: Elimination: Goal: Will not experience complications related to urinary retention Outcome: Progressing   Problem: Pain Managment: Goal: General experience of comfort will improve Outcome: Progressing   Problem: Safety: Goal: Ability to remain free from injury will improve Outcome: Progressing   Problem: Skin Integrity: Goal: Risk for impaired skin integrity will decrease Outcome: Progressing   Problem: Education: Goal: Knowledge of disease or condition will improve Outcome: Progressing   Problem: Activity: Goal: Ability to tolerate increased activity will improve Outcome: Progressing Goal: Will verbalize the importance of balancing activity with adequate rest periods Outcome: Progressing   Problem: Respiratory: Goal: Ability to maintain a clear airway will improve Outcome: Progressing Goal: Levels of oxygenation will improve Outcome: Progressing

## 2019-06-10 NOTE — Plan of Care (Signed)
  Problem: Health Behavior/Discharge Planning: Goal: Ability to manage health-related needs will improve Outcome: Progressing   

## 2019-06-10 NOTE — Progress Notes (Signed)
OT Cancellation Note  Patient Details Name: Gregory Hall MRN: [id number] DOB: 12/17/1955   Cancelled Treatment:    Reason Eval/Treat Not Completed: Patient declined, no reason specified;Other (comment). Thank you for the OT consult. Order received and chart reviewed. Upon arrival to pt room, pt sleeping, wakes to VCs. Pt endorses fatigue, states he was not able to get to his room until "2:30 last night" politely declines therapy at this time. Agreeable to attempting later this date. Will re-attempt as able and pt medically appropriate for OT services.   Rockney Ghee, M.S., OTR/L Ascom: [phone] 06/10/19, 9:43 AM

## 2019-06-10 NOTE — Progress Notes (Addendum)
PROGRESS NOTE    Gregory Hall  ZOX:[id number] DOB: [birth year] DOA: 06/09/2019 PCP: Remi Haggard, FNP     Brief Narrative:  Gregory Hall is a 64 y.o. WM PMHx  Anxiety on anxiolytics, COPD, Chronic Respiratory Failure with Hypoxia on home O2 at 3 L, ongoing tobacco abuse (currently smokes 1/2 PPD; has been smoking for 40 years), CAD with history of PCI of the LAD in 2010, last stress test 2017 low risk, chronic pain syndrome  who presents to the emergency room with a 1-day history of wheezing, not improving with home bronchodilator therapy.  He has associated chest congestion and initially on retrosternal chest tightness of moderate intensity, nonradiating.  Has no associated nausea vomiting diaphoresis, lightheadedness or palpitations.  Denies cough fever or chills.  ED Course: On arrival, patient was afebrile, tachycardic to 24 with O2 sat 88% on home flow rate of 2 L improving to the mid 90s on 4 L.  Vitals were otherwise unremarkable.  Troponin was 5 with a BNP of 51.  Venous blood gas showed PCO2 of 68.Dema Severin cell count normal at 8600.  Noted to have an elevated blood sugar of 272 with no known history of diabetes.  Chest x-ray showed no acute disease.  Patient was treated with several rounds of DuoNeb but continued to have increased work of breathing.  Hospitalist consulted for admission.   Subjective: A/O x4, positive S OB, negative CP, negative abdominal pain.  Initially stated did not know what set off his acute on chronic respiratory failure, later admitted he understood it was his continued smoking.   Assessment & Plan:   Principal Problem:   Acute on chronic respiratory failure with hypoxia and hypercapnia (HCC) Active Problems:   Chest pain   COPD with acute exacerbation (HCC)   CAD (coronary artery disease)   HTN (hypertension)   Anxiety   Chronic prescription opiate use   Hyperglycemia   Diabetes mellitus type 2, uncontrolled, with complications  (HCC)   Chronic pain syndrome  Acute on chronic respiratory failure with hypoxia and hypercapnia/COPD exacerbation -5-day course antibiotics -Solu-Medrol 60 mg BID -DuoNeb -Flutter valve -Incentive spirometer -Titrate O2 to maintain SPO2> 94% -6/2 lactic acid/procalcitonin pending  Chest pain -Trend troponin most likely secondary to COPD exacerbation but given his cardiac history we will check -Hx PCI to LAD 2010 -NTG sublingual PRN, morphine for breakthrough    DM type II uncontrolled with complication -08/19/9145 hemoglobin A1c= 8.3 -Hemoglobin A1c pending -Lipid panel pending -Lantus 26 units daily -NovoLog 4 unitsqac -Moderate SSI  Anxiety/bipolar? -Buspirone 10 mg BID -Cymbalta 60 mg daily -Continue Lamictal 100 mg daily -Patient noted to be on diazepam, citalopram, BuSpar   Chronic pain on chronic opiates -As needed oxycodone -Lyrica 200 mg TID  Tobacco abuse -Nicotine patch -Discussed at length reason patient need to discontinue smoking to include DEATH   DVT prophylaxis: Xarelto Code Status:  Family Communication:  Disposition Plan:  Status is: Inpatient    Dispo: The patient is from: Assisted living facility              Anticipated d/c is to: Assisted living facility              Anticipated d/c date is: 8 June              Patient currently unstable      Consultants:    Procedures/Significant Events:    I have personally reviewed and interpreted all radiology studies and my  findings are as above.  VENTILATOR SETTINGS: Nasal cannula 6/1 Flow 3 L/min SPO2; 92%   Cultures 6/1 respiratory virus panel 6/1 sputum culture 6/1 strep pneumo/Legionella urine antigen pending    Antimicrobials: Anti-infectives (From admission, onward)   Start     Dose/Rate Stop   06/10/19 1830  cefTRIAXone (ROCEPHIN) 2 g in sodium chloride 0.9 % 100 mL IVPB     2 g 200 mL/hr over 30 Minutes 06/15/19 1829   06/10/19 1830  azithromycin (ZITHROMAX)  500 mg in sodium chloride 0.9 % 250 mL IVPB     500 mg 250 mL/hr over 60 Minutes 06/15/19 1829       Devices    LINES / TUBES:      Continuous Infusions:   Objective: Vitals:   06/10/19 0330 06/10/19 0400 06/10/19 0433 06/10/19 0812  BP: 135/71 135/64 140/72 (!) 148/73  Pulse: 98 (!) 101 (!) 101 (!) 105  Resp: 15 15 20 16   Temp:   (!) 97.5 F (36.4 C) 98.1 F (36.7 C)  TempSrc:   Oral Oral  SpO2: 92% 93% 94% 95%  Weight:   92.9 kg   Height:   6' (1.829 m)     Intake/Output Summary (Last 24 hours) at 06/10/2019 0845 Last data filed at 06/10/2019 0550 Gross per 24 hour  Intake --  Output 250 ml  Net -250 ml   Filed Weights   06/09/19 2117 06/10/19 0433  Weight: 96.2 kg 92.9 kg    Examination:  General: A/O x4, positive acute on chronic respiratory distress Eyes: negative scleral hemorrhage, negative anisocoria, negative icterus ENT: Negative Runny nose, negative gingival bleeding, Neck:  Negative scars, masses, torticollis, lymphadenopathy, JVD Lungs: Tachypneic, diffuse decreased air movement, positive wheezes, negative crackles Cardiovascular: Regular rate and rhythm without murmur gallop or rub normal S1 and S2 Abdomen: negative abdominal pain, nondistended, positive soft, bowel sounds, no rebound, no ascites, no appreciable mass Extremities: No significant cyanosis, clubbing, or edema bilateral lower extremities Skin: Negative rashes, lesions, ulcers Psychiatric:  Negative depression, negative anxiety, negative fatigue, negative mania  Central nervous system:  Cranial nerves II through XII intact, tongue/uvula midline, all extremities muscle strength 5/5, sensation intact throughout,  negative dysarthria, negative expressive aphasia, negative receptive aphasia.  .     Data Reviewed: Care during the described time interval was provided by me .  I have reviewed this patient's available data, including medical history, events of note, physical examination,  and all test results as part of my evaluation.  CBC: Recent Labs  Lab 06/09/19 2119  WBC 8.6  NEUTROABS 4.9  HGB 14.0  HCT 44.1  MCV 92.5  PLT 216   Basic Metabolic Panel: Recent Labs  Lab 06/09/19 2119  NA 139  K 4.9  CL 98  CO2 35*  GLUCOSE 270*  BUN 10  CREATININE 1.08  CALCIUM 8.5*   GFR: Estimated Creatinine Clearance: 73.8 mL/min (by C-G formula based on SCr of 1.08 mg/dL). Liver Function Tests: No results for input(s): AST, ALT, ALKPHOS, BILITOT, PROT, ALBUMIN in the last 168 hours. No results for input(s): LIPASE, AMYLASE in the last 168 hours. No results for input(s): AMMONIA in the last 168 hours. Coagulation Profile: No results for input(s): INR, PROTIME in the last 168 hours. Cardiac Enzymes: No results for input(s): CKTOTAL, CKMB, CKMBINDEX, TROPONINI in the last 168 hours. BNP (last 3 results) No results for input(s): PROBNP in the last 8760 hours. HbA1C: No results for input(s): HGBA1C in the last 72 hours.  CBG: No results for input(s): GLUCAP in the last 168 hours. Lipid Profile: No results for input(s): CHOL, HDL, LDLCALC, TRIG, CHOLHDL, LDLDIRECT in the last 72 hours. Thyroid Function Tests: No results for input(s): TSH, T4TOTAL, FREET4, T3FREE, THYROIDAB in the last 72 hours. Anemia Panel: No results for input(s): VITAMINB12, FOLATE, FERRITIN, TIBC, IRON, RETICCTPCT in the last 72 hours. Sepsis Labs: No results for input(s): PROCALCITON, LATICACIDVEN in the last 168 hours.  Recent Results (from the past 240 hour(s))  SARS Coronavirus 2 by RT PCR (hospital order, performed in [facility] hospital lab) Nasopharyngeal Nasopharyngeal Swab     Status: None   Collection Time: 06/09/19 10:09 PM   Specimen: Nasopharyngeal Swab  Result Value Ref Range Status   SARS Coronavirus 2 NEGATIVE NEGATIVE Final    Comment: (NOTE) SARS-CoV-2 target nucleic acids are NOT DETECTED. The SARS-CoV-2 RNA is generally detectable in upper and lower respiratory  specimens during the acute phase of infection. The lowest concentration of SARS-CoV-2 viral copies this assay can detect is 250 copies / mL. A negative result does not preclude SARS-CoV-2 infection and should not be used as the sole basis for treatment or other patient management decisions.  A negative result may occur with improper specimen collection / handling, submission of specimen other than nasopharyngeal swab, presence of viral mutation(s) within the areas targeted by this assay, and inadequate number of viral copies (<250 copies / mL). A negative result must be combined with clinical observations, patient history, and epidemiological information. Fact Sheet for Patients:   BoilerBrush.com.cy Fact Sheet for Healthcare Providers: https://pope.com/ This test is not yet approved or cleared  by the [place] FDA and has been authorized for detection and/or diagnosis of SARS-CoV-2 by FDA under an Emergency Use Authorization (EUA).  This EUA will remain in effect (meaning this test can be used) for the duration of the COVID-19 declaration under Section 564(b)(1) of the Act, 21 U.S.C. section 360bbb-3(b)(1), unless the authorization is terminated or revoked sooner. Performed at [facility], [address] [place], [place] [address]   MRSA PCR Screening     Status: Abnormal   Collection Time: 06/10/19  5:48 AM   Specimen: Nasal Mucosa; Nasopharyngeal  Result Value Ref Range Status   MRSA by PCR POSITIVE (A) NEGATIVE Final    Comment:        The GeneXpert MRSA Assay (FDA approved for NASAL specimens only), is one component of a comprehensive MRSA colonization surveillance program. It is not intended to diagnose MRSA infection nor to guide or monitor treatment for MRSA infections. RESULT CALLED TO, READ BACK BY AND VERIFIED WITHKaris Juba RN AT [phone] ON 06/10/19 [facility] Performed at [facility], [address], [place], [place] [address]          Radiology Studies: [facility] Chest [facility] 1 View  Result Date: 06/09/2019 CLINICAL DATA:  Dyspnea EXAM: PORTABLE CHEST 1 VIEW COMPARISON:  05/25/2019 FINDINGS: Cardiac shadow is within normal limits. Aortic calcifications are noted. Spinal stimulator and left subclavian arterial stent are noted. Lungs are hyperinflated consistent with COPD. Bibasilar scarring is noted. No acute infiltrate is noted. IMPRESSION: COPD and bibasilar scarring.  No other focal abnormality is noted. Electronically Signed   By: Alcide Clever M.D.   On: 06/09/2019 21:37        Scheduled Meds: . atorvastatin  20 mg Oral Daily  . busPIRone  10 mg Oral BID  . Chlorhexidine Gluconate Cloth  6 each Topical Q0600  .  enoxaparin (LOVENOX) injection  40 mg Subcutaneous Q24H  . insulin aspart  0-9 Units Subcutaneous TID WC  . ipratropium-albuterol  3 mL Nebulization Q6H  . methylPREDNISolone (SOLU-MEDROL) injection  40 mg Intravenous Q6H   Followed by  . [START ON 06/11/2019] predniSONE  40 mg Oral Q breakfast  . metoprolol succinate  25 mg Oral Daily  . mupirocin ointment  1 application Nasal BID   Continuous Infusions:   LOS: 0 days    Time spent:40 min    Quinci Gavidia, Roselind Messier, MD [facility] Pager [phone]  If 7PM-7AM, please contact night-coverage [email or web address] Password [facility] 06/10/2019, 8:45 AM

## 2019-06-10 NOTE — Progress Notes (Signed)
PT Cancellation Note  Patient Details Name: Gregory Hall MRN: [id number] DOB: Apr 04, 1955   Cancelled Treatment:    Reason Eval/Treat Not Completed: Patient declined, no reason specified Attempted to see pt multiple times this AM.  First he was wanting to get breakfast before doing anything, next time he was sleeping soundly and on 3rd AM attempt he reports he had just gotten pain under control and asked to hold until afternoon.  Will try back this afternoon as time allows and pt is willing to participate.  Malachi Pro, DPT 06/10/2019, 12:20 PM

## 2019-06-10 NOTE — Evaluation (Addendum)
Physical Therapy Evaluation Patient Details Name: Gregory Hall MRN: [id number] DOB: Nov 27, 1955 Today's Date: 06/10/2019   History of Present Illness  64 y.o. male with medical history significant for COPD on home O2 at 2 L, CAD, chronic pain as well as history of anxiety on anxiolytics, who presents to the emergency room with a 1-day history of wheezing, not improving with home bronchodilator therapy.  Clinical Impression  Pt not interested in trying any walking today, willing to perform some bed mobility and transition to/from recliner.  Pt spends most of his time in the w/c and reports being able to manage most of what he needs with the normal amount of assist he gets from ALF for meals, laundry, etc.  He was confident with transition to recliner, but c/o pain with most acts (and this is why he did not wish to do any ambulation).  Pt would benefit from HHPT to work on pain management, increased activity tolerance and general strength and mobility work, he indicates he would be interested.     Follow Up Recommendations Home health PT    Equipment Recommendations  None recommended by PT    Recommendations for Other Services       Precautions / Restrictions Precautions Precautions: Fall Restrictions Weight Bearing Restrictions: No      Mobility  Bed Mobility Overal bed mobility: Needs Assistance Bed Mobility: Supine to Sit;Sit to Supine     Supine to sit: Supervision Sit to supine: Supervision   General bed mobility comments: Pt able to transtion to/from supine w/o need for direct assist  Transfers Overall transfer level: Needs assistance Equipment used: None Transfers: Stand Pivot Transfers   Stand pivot transfers: Min guard       General transfer comment: Pt able to transition bed to recliner and back to bed with good confidence, no overt safety issues.  No c/o subjective fatigue, reports feeling confident.  Ambulation/Gait             General Gait  Details: Pt reports that he is able to do some walking if needed, but generally it causes too much pain and he does not much.  Pt deferred this date.  Stairs            Wheelchair Mobility    Modified Rankin (Stroke Patients Only)       Balance Overall balance assessment: Mild deficits observed, not formally tested                                           Pertinent Vitals/Pain Pain Assessment: 0-10 Pain Score: 8  Pain Location: chronic back pain Pain Descriptors / Indicators: Constant Pain Intervention(s): Limited activity within patient's tolerance;Monitored during session;Premedicated before session;Utilized relaxation techniques    Home Living Family/patient expects to be discharged to:: Assisted living               Home Equipment: Bedside commode;Wheelchair - manual;Hand held shower head;Grab bars - tub/shower      Prior Function Level of Independence: Independent with assistive device(s)         Comments: Pt reports he is generally independent with BADL management. He is able to stand pivot to/from his WC w/o assist. Manages dressing and bathing tasks independently. ALF staff support meal prep, medication management, and can provide physical assist PRN.     Hand Dominance   Dominant Hand: Right  Extremity/Trunk Assessment   Upper Extremity Assessment Upper Extremity Assessment: Defer to OT evaluation    Lower Extremity Assessment Lower Extremity Assessment: Overall WFL for tasks assessed;Generalized weakness    Cervical / Trunk Assessment Cervical / Trunk Assessment: Normal  Communication   Communication: No difficulties  Cognition Arousal/Alertness: Awake/alert Behavior During Therapy: WFL for tasks assessed/performed Overall Cognitive Status: Within Functional Limits for tasks assessed                                        General Comments General comments (skin integrity, edema, etc.): per pt  doffed O2 during transfers to/from recliner, sats dropped to high 80s. w/o subjective c/o fatigue    Exercises Other Exercises    Assessment/Plan    PT Assessment Patient needs continued PT services  PT Problem List Decreased strength;Decreased range of motion;Decreased activity tolerance;Decreased safety awareness;Decreased knowledge of use of DME;Decreased balance;Decreased mobility;Pain       PT Treatment Interventions DME instruction;Gait training;Functional mobility training;Therapeutic activities;Therapeutic exercise;Balance training;Neuromuscular re-education;Patient/family education    PT Goals (Current goals can be found in the Care Plan section)  Acute Rehab PT Goals Patient Stated Goal: To have less pain PT Goal Formulation: With patient Time For Goal Achievement: 06/24/19 Potential to Achieve Goals: Good    Frequency Min 2X/week   Barriers to discharge        Co-evaluation               AM-PAC PT "6 Clicks" Mobility  Outcome Measure Help needed turning from your back to your side while in a flat bed without using bedrails?: None Help needed moving from lying on your back to sitting on the side of a flat bed without using bedrails?: None Help needed moving to and from a bed to a chair (including a wheelchair)?: None Help needed standing up from a chair using your arms (e.g., wheelchair or bedside chair)?: A Little Help needed to walk in hospital room?: A Lot Help needed climbing 3-5 steps with a railing? : A Lot 6 Click Score: 19    End of Session Equipment Utilized During Treatment: Gait belt;Oxygen(3L) Activity Tolerance: Patient tolerated treatment well Patient left: with bed alarm set;with call bell/phone within reach Nurse Communication: Mobility status PT Visit Diagnosis: Muscle weakness (generalized) (M62.81);Difficulty in walking, not elsewhere classified (R26.2)    Time: 7824-2353 PT Time Calculation (min) (ACUTE ONLY): 15 min   Charges:    PT Evaluation $PT Eval Low Complexity: 1 Low          Malachi Pro, DPT 06/10/2019, 4:38 PM

## 2019-06-10 NOTE — ED Notes (Signed)
Pt complains of chronic pain in the neck, back and knees. 7/10. PRN meds to follow

## 2019-06-10 NOTE — Progress Notes (Signed)
Inpatient Diabetes Program Recommendations  AACE/ADA: New Consensus Statement on Inpatient Glycemic Control (2015)  Target Ranges:  Prepandial:   less than 140 mg/dL      Peak postprandial:   less than 180 mg/dL (1-2 hours)      Critically ill patients:  140 - 180 mg/dL   Lab Results  Component Value Date   GLUCAP 359 (H) 06/10/2019   HGBA1C 7.9 (H) 06/10/2019    Review of Glycemic Control Results for OCTAVIA, MOTTOLA (MRN [id number]) as of 06/10/2019 11:31  Ref. Range 05/25/2019 14:26 06/10/2019 08:45 06/10/2019 11:22  Glucose-Capillary Latest Ref Range: 70 - 99 mg/dL 343 (H) 735 (H) 789 (H)   Diabetes history: DM2 Outpatient Diabetes medications: Lantus 26 units + Humalog 4 units tid meal coverage + Metformin 500 mg qd Current orders for Inpatient glycemic control: Novolog sensitive correction tid  Inpatient Diabetes Program Recommendations:   -Lantus 26 units qd -Humalog 3 units tid meal coverage if eats 50% -Add Novolog 0-5 units hs correction Secure chat to Dr. Joseph Art.  Thank you, Billy Fischer. Claxton Levitz, RN, MSN, CDE  Diabetes Coordinator Inpatient Glycemic Control Team Team Pager [phone] (8am-5pm) 06/10/2019 11:39 AM

## 2019-06-10 NOTE — NC FL2 (Signed)
[place] MEDICAID FL2 LEVEL OF CARE SCREENING TOOL     IDENTIFICATION  Patient Name: Gregory Hall Birthdate: [birth year] Sex: male Admission Date (Current Location): 06/09/2019  [place] and [place] Number:  Chiropodist and Address:  [facility], [address], [place], [place] [address]      Provider Number: [id number]  Attending Physician Name and Address:  Drema Dallas, MD  Relative Name and Phone Number:  Jimmye Norman [phone]    Current Level of Care: Hospital Recommended Level of Care: [facility] Prior Approval Number:    Date Approved/Denied:   PASRR Number: [id number]  Discharge Plan: Other (Comment)(Family Home Care)    Current Diagnoses: Patient Active Problem List   Diagnosis Date Noted  . Diabetes mellitus type 2, uncontrolled, with complications (HCC) 06/10/2019  . Chronic pain syndrome 06/10/2019  . COPD with acute exacerbation (HCC) 06/09/2019  . CAD (coronary artery disease) 06/09/2019  . HTN (hypertension) 06/09/2019  . Acute on chronic respiratory failure with hypoxia and hypercapnia (HCC) 06/09/2019  . Anxiety 06/09/2019  . Chronic prescription opiate use 06/09/2019  . Hyperglycemia 06/09/2019  . Subclavian artery stenosis, left (HCC) 08/20/2014  . Rectus diastasis 07/19/2012  . Hernia   . Edema 05/10/2011  . Preoperative cardiovascular examination 10/06/2010  . SMOKER 11/25/2009  . CAROTID BRUIT, RIGHT 11/24/2009  . Chest pain 11/24/2009  . Hyperlipidemia 03/30/2009  . Coronary atherosclerosis 03/30/2009  . HYPERTENSION, HX OF 03/30/2009    Orientation RESPIRATION BLADDER Height & Weight     Self, Time, Situation, Place  O2(2-4 lters) Continent Weight: 92.9 kg Height:  6' (182.9 cm)  BEHAVIORAL SYMPTOMS/MOOD NEUROLOGICAL BOWEL NUTRITION STATUS      Continent Diet(Heart Healthy)  AMBULATORY STATUS COMMUNICATION OF NEEDS Skin   (P) Independent (P) Verbally                          Personal Care Assistance Level of Assistance              Functional Limitations Info             SPECIAL CARE FACTORS FREQUENCY                       Contractures Contractures Info: Not present    Additional Factors Info  Code Status, Allergies Code Status Info: Full code Allergies Info: tylenol, NSAIDS           Current Medications (06/10/2019):  This is the current hospital active medication list Current Facility-Administered Medications  Medication Dose Route Frequency Provider Last Rate Last Admin  . albuterol (PROVENTIL) (2.5 MG/3ML) 0.083% nebulizer solution 2.5 mg  2.5 mg Nebulization Q2H PRN Andris Baumann, MD      . atorvastatin (LIPITOR) tablet 20 mg  20 mg Oral Daily Lindajo Royal V, MD      . busPIRone (BUSPAR) tablet 10 mg  10 mg Oral BID Andris Baumann, MD   10 mg at 06/10/19 1015  . Chlorhexidine Gluconate Cloth 2 % PADS 6 each  6 each Topical Q0600 Drema Dallas, MD      . enoxaparin (LOVENOX) injection 40 mg  40 mg Subcutaneous Q24H Andris Baumann, MD   40 mg at 06/10/19 0227  . insulin aspart (novoLOG) injection 0-9 Units  0-9 Units Subcutaneous TID WC Andris Baumann, MD   9 Units at 06/10/19 1015  . ipratropium-albuterol (DUONEB) 0.5-2.5 (3) MG/3ML  nebulizer solution 3 mL  3 mL Nebulization Q6H Athena Masse, MD   3 mL at 06/10/19 0741  . methylPREDNISolone sodium succinate (SOLU-MEDROL) 40 mg/mL injection 40 mg  40 mg Intravenous Q6H Athena Masse, MD   40 mg at 06/10/19 1015   Followed by  . [START ON 06/11/2019] predniSONE (DELTASONE) tablet 40 mg  40 mg Oral Q breakfast Judd Gaudier V, MD      . metoprolol succinate (TOPROL-XL) 24 hr tablet 25 mg  25 mg Oral Daily Athena Masse, MD   25 mg at 06/10/19 1014  . mupirocin ointment (BACTROBAN) 2 % 1 application  1 application Nasal BID Allie Bossier, MD   1 application at 16/10/96 1015  . nitroGLYCERIN (NITROSTAT) SL tablet 0.4 mg  0.4 mg Sublingual Q5 min PRN Athena Masse, MD       . oxyCODONE (Oxy IR/ROXICODONE) immediate release tablet 10 mg  10 mg Oral Q6H PRN Lang Snow, NP   10 mg at 06/10/19 1014     Discharge Medications: Please see discharge summary for a list of discharge medications.  Relevant Imaging Results:  Relevant Lab Results:   Additional Information SS# [id number]  Su Hilt, RN

## 2019-06-11 DIAGNOSIS — R739 Hyperglycemia, unspecified: Secondary | ICD-10-CM

## 2019-06-11 DIAGNOSIS — R079 Chest pain, unspecified: Secondary | ICD-10-CM

## 2019-06-11 DIAGNOSIS — Z79891 Long term (current) use of opiate analgesic: Secondary | ICD-10-CM

## 2019-06-11 LAB — GLUCOSE, CAPILLARY
Glucose-Capillary: 137 mg/dL — ABNORMAL HIGH (ref 70–99)
Glucose-Capillary: 176 mg/dL — ABNORMAL HIGH (ref 70–99)
Glucose-Capillary: 216 mg/dL — ABNORMAL HIGH (ref 70–99)
Glucose-Capillary: 230 mg/dL — ABNORMAL HIGH (ref 70–99)
Glucose-Capillary: 236 mg/dL — ABNORMAL HIGH (ref 70–99)
Glucose-Capillary: 308 mg/dL — ABNORMAL HIGH (ref 70–99)

## 2019-06-11 LAB — CBC WITH DIFFERENTIAL/PLATELET
Abs Immature Granulocytes: 0.08 10*3/uL — ABNORMAL HIGH (ref 0.00–0.07)
Basophils Absolute: 0 10*3/uL (ref 0.0–0.1)
Basophils Relative: 0 %
Eosinophils Absolute: 0 10*3/uL (ref 0.0–0.5)
Eosinophils Relative: 0 %
HCT: 43 % (ref 39.0–52.0)
Hemoglobin: 13.7 g/dL (ref 13.0–17.0)
Immature Granulocytes: 1 %
Lymphocytes Relative: 7 %
Lymphs Abs: 1.2 10*3/uL (ref 0.7–4.0)
MCH: 29 pg (ref 26.0–34.0)
MCHC: 31.9 g/dL (ref 30.0–36.0)
MCV: 91.1 fL (ref 80.0–100.0)
Monocytes Absolute: 1.3 10*3/uL — ABNORMAL HIGH (ref 0.1–1.0)
Monocytes Relative: 8 %
Neutro Abs: 13.1 10*3/uL — ABNORMAL HIGH (ref 1.7–7.7)
Neutrophils Relative %: 84 %
Platelets: 237 10*3/uL (ref 150–400)
RBC: 4.72 MIL/uL (ref 4.22–5.81)
RDW: 15.8 % — ABNORMAL HIGH (ref 11.5–15.5)
WBC: 15.6 10*3/uL — ABNORMAL HIGH (ref 4.0–10.5)
nRBC: 0 % (ref 0.0–0.2)

## 2019-06-11 LAB — COMPREHENSIVE METABOLIC PANEL
ALT: 9 U/L (ref 0–44)
AST: 11 U/L — ABNORMAL LOW (ref 15–41)
Albumin: 2.9 g/dL — ABNORMAL LOW (ref 3.5–5.0)
Alkaline Phosphatase: 73 U/L (ref 38–126)
Anion gap: 9 (ref 5–15)
BUN: 17 mg/dL (ref 8–23)
CO2: 34 mmol/L — ABNORMAL HIGH (ref 22–32)
Calcium: 9.1 mg/dL (ref 8.9–10.3)
Chloride: 97 mmol/L — ABNORMAL LOW (ref 98–111)
Creatinine, Ser: 0.92 mg/dL (ref 0.61–1.24)
GFR calc Af Amer: >60 mL/min (ref >60)
GFR calc non Af Amer: >60 mL/min (ref >60)
Glucose, Bld: 161 mg/dL — ABNORMAL HIGH (ref 70–99)
Potassium: 4.5 mmol/L (ref 3.5–5.1)
Sodium: 140 mmol/L (ref 135–145)
Total Bilirubin: 0.5 mg/dL (ref 0.3–1.2)
Total Protein: 5.8 g/dL — ABNORMAL LOW (ref 6.5–8.1)

## 2019-06-11 LAB — STREP PNEUMONIAE URINARY ANTIGEN: Strep Pneumo Urinary Antigen: NEGATIVE

## 2019-06-11 LAB — EXPECTORATED SPUTUM ASSESSMENT W GRAM STAIN, RFLX TO RESP C

## 2019-06-11 LAB — MAGNESIUM: Magnesium: 1.9 mg/dL (ref 1.7–2.4)

## 2019-06-11 LAB — RESP PANEL BY RT PCR (RSV, FLU A&B, COVID)
Influenza A by PCR: NEGATIVE
Influenza B by PCR: NEGATIVE
Respiratory Syncytial Virus by PCR: NEGATIVE
SARS Coronavirus 2 by RT PCR: NEGATIVE

## 2019-06-11 LAB — PROCALCITONIN: Procalcitonin: <0.1 ng/mL

## 2019-06-11 LAB — PHOSPHORUS: Phosphorus: 4.1 mg/dL (ref 2.5–4.6)

## 2019-06-11 MED ORDER — OXYCODONE HCL 5 MG PO TABS
10.0000 mg | ORAL_TABLET | ORAL | Status: DC | PRN
  Administered 2019-06-11 – 2019-06-14 (×18): 10 mg via ORAL
  Filled 2019-06-11 (×18): qty 2

## 2019-06-11 MED ORDER — METHYLPREDNISOLONE SODIUM SUCC 125 MG IJ SOLR
60.0000 mg | Freq: Three times a day (TID) | INTRAMUSCULAR | Status: DC
  Administered 2019-06-11 – 2019-06-12 (×4): 60 mg via INTRAVENOUS
  Filled 2019-06-11 (×4): qty 2

## 2019-06-11 MED ORDER — OXYMETAZOLINE HCL 0.05 % NA SOLN
2.0000 | Freq: Two times a day (BID) | NASAL | Status: DC | PRN
  Filled 2019-06-11: qty 15

## 2019-06-11 MED ORDER — VITAMIN D 25 MCG (1000 UNIT) PO TABS
2000.0000 [IU] | ORAL_TABLET | Freq: Every day | ORAL | Status: DC
  Administered 2019-06-11 – 2019-06-14 (×4): 2000 [IU] via ORAL
  Filled 2019-06-11 (×4): qty 2

## 2019-06-11 MED ORDER — ONDANSETRON HCL 4 MG PO TABS: 4.0000 mg | ORAL_TABLET | Freq: Every day | ORAL | Status: DC | PRN

## 2019-06-11 MED ORDER — ENSURE MAX PROTEIN PO LIQD
11.0000 [oz_av] | Freq: Two times a day (BID) | ORAL | Status: DC
  Administered 2019-06-12 – 2019-06-14 (×5): 11 [oz_av] via ORAL
  Filled 2019-06-11: qty 330

## 2019-06-11 MED ORDER — GUAIFENESIN ER 600 MG PO TB12
1200.0000 mg | ORAL_TABLET | Freq: Two times a day (BID) | ORAL | Status: DC
  Administered 2019-06-11 – 2019-06-14 (×7): 1200 mg via ORAL
  Filled 2019-06-11 (×7): qty 2

## 2019-06-11 MED ORDER — FUROSEMIDE 20 MG PO TABS: 20.0000 mg | ORAL_TABLET | Freq: Every day | ORAL | Status: DC | PRN

## 2019-06-11 MED ORDER — BUDESONIDE 0.25 MG/2ML IN SUSP
0.2500 mg | Freq: Two times a day (BID) | RESPIRATORY_TRACT | Status: DC
  Administered 2019-06-11 – 2019-06-14 (×7): 0.25 mg via RESPIRATORY_TRACT
  Filled 2019-06-11 (×7): qty 2

## 2019-06-11 MED ORDER — PANTOPRAZOLE SODIUM 40 MG PO TBEC
40.0000 mg | DELAYED_RELEASE_TABLET | Freq: Every day | ORAL | Status: DC
  Administered 2019-06-11 – 2019-06-14 (×4): 40 mg via ORAL
  Filled 2019-06-11 (×4): qty 1

## 2019-06-11 MED ORDER — ARFORMOTEROL TARTRATE 15 MCG/2ML IN NEBU
15.0000 ug | INHALATION_SOLUTION | Freq: Two times a day (BID) | RESPIRATORY_TRACT | Status: DC
  Administered 2019-06-11 – 2019-06-14 (×7): 15 ug via RESPIRATORY_TRACT
  Filled 2019-06-11 (×9): qty 2

## 2019-06-11 NOTE — Progress Notes (Signed)
PROGRESS NOTE    Gregory Hall  TKP:[id number] DOB: 02-May-1955 DOA: 06/09/2019 PCP: Armando Gang, FNP   Brief Narrative:  HPI per Dr. Lindajo Royal on 06/09/19 Gregory Hall is a 64 y.o. male with medical history significant for COPD on home O2 at 2 L, CAD with history of PCI of the LAD in 2010, last stress test 2017 low risk, chronic pain as well as history of anxiety on anxiolytics, who presents to the emergency room with a 1-day history of wheezing, not improving with home bronchodilator therapy.  He has associated chest congestion and initially on retrosternal chest tightness of moderate intensity, nonradiating.  Has no associated nausea vomiting diaphoresis, lightheadedness or palpitations.  Denies cough fever or chills.  ED Course: On arrival, patient was afebrile, tachycardic to 24 with O2 sat 88% on home flow rate of 2 L improving to the mid 90s on 4 L.  Vitals were otherwise unremarkable.  Troponin was 5 with a BNP of 51.  Venous blood gas showed PCO2 of 68.[name] cell count normal at 8600.  Noted to have an elevated blood sugar of 272 with no known history of diabetes.  Chest x-ray showed no acute disease.  Patient was treated with several rounds of DuoNeb but continued to have increased work of breathing.  Hospitalist consulted for admission.  **Interim History Continues to be dyspneic and when to use supplemental oxygen via nasal cannula.  Has minimal improvement.  We will adjust his medications further and if not improving will obtain a pulmonary consult and a CT of the chest to rule out PE.  Assessment & Plan:   Principal Problem:   Acute on chronic respiratory failure with hypoxia and hypercapnia (HCC) Active Problems:   Chest pain   COPD with acute exacerbation (HCC)   CAD (coronary artery disease)   HTN (hypertension)   Anxiety   Chronic prescription opiate use   Hyperglycemia   Diabetes mellitus type 2, uncontrolled, with complications (HCC)    Chronic pain syndrome   COPD exacerbation (HCC)  Acute on chronic respiratory failure with hypoxia and hypercapnia in the setting of Acute COPD exacerbation -5-day course antibiotics with Ceftriaxone and Azithromycin  -Solu-Medrol 60 mg BID increased to TID given persistent wheezing  -DuoNeb scheduled q6h and C/w Albuterol 2.5 mg Neb q2hprn Wheezing and SOB -Add Budesonide 0.25 mg BID and Arformoterol 15 mcg Neb BID  -Takes Trelegy Ellipta 1 puff into the lungs daily at home -Flutter valve -Incentive spirometer -Increase Guaifenesin to 1200 mg po BID  -Restater Home Lasix 20 mg po Daily PRN -6/2 lactic acid/procalcitonin Negative -SpO2: 94 % O2 Flow Rate (L/min): 3 L/min; however wears 3 L at home -C/w Droplet Precautions and will get Respiratory Virus Panel   -We will need an ambulatory home O2 screen prior to discharge -Continuous pulse oximetry and maintain O2 saturations greater than 90% -If he is not improving will consider a CTA of the chest to rule out PE and obtain a pulmonary consult  Chest pain -Trend troponin most likely secondary to COPD exacerbation but given his cardiac history we will check -Intial Troponin was 13 and repeat was 11 -Hx PCI to LAD 2010 -NTG sublingual PRN, morphine for breakthrough  DM type II uncontrolled with complication -08/24/2011 hemoglobin A1c= 8.3 -Hemoglobin A1c 7.9 this visit -Hold home Metformin 500 g p.o. daily with breakfast -Lipid panel done and unremarkable  -Lantus 26 units daily -NovoLog 4 unitsqac -Moderate SSI  -We will consult diabetes  education coordinator for further evaluation recommendations given that we are increasing his steroids from 60 mg IV every 12 to every 8 hours -CBGs have been ranging from 137-308  Anxiety/Depression/Bipolar? -Continue with buspirone 10 mg BID -Continue with duloxetine 60 mg daily -Continue Lamictal 100 mg daily -Continue with pregabalin 201 p.o. 3 times daily along   Dyslipidemia -Lipid  panel was normal and showed total cholesterol/HDL ratio 2.5, cholesterol level of 110, HDL 44, LDL of 53, triglycerides of 65, VLDL 13 -Continue atorvastatin 80 mg p.o. nightly  GERD -Continue PPI with Pantoprazole 40 g daily  Leukocytosis -The setting of steroid demargination -Patient CBC went from 9.3 and is now 15.6 -Continue to monitor for signs and symptoms of infection  -Continue with antibiotics as above  -Repeat CBC in the a.m.  Chronic pain on Chronic Opiates -Continue with as needed oxycodone and I have increased the dose from 10 mg every 6 hours as needed to every 4 hours as needed given that this is his home dose -Lyrica 200 mg TID -Holding his home cyclobenzaprine 5 mg p.o. 3 times daily as needed for muscle spasms for now  Tobacco Abuse -C/w Nicotine patch -Dr. Joseph Art discussed at length about the reason patient need to discontinue smoking to include DEATH    DVT prophylaxis: Anticoagulated with Rivaroxaban daily with supper Code Status: FULL CODE Family Communication: No family present at bedside  Disposition Plan: Remain inpatient given his continued dyspnea shortness of breath not back to baseline  Status is: Inpatient  Remains inpatient appropriate because:IV treatments appropriate due to intensity of illness or inability to take PO and Inpatient level of care appropriate due to severity of illness   Dispo: The patient is from: Home              Anticipated d/c is to: Home              Anticipated d/c date is: 2 days              Patient currently is not medically stable to d/c.  Consultants:   None   Procedures: None   Antimicrobials:  Anti-infectives (From admission, onward)   Start     Dose/Rate Route Frequency Ordered Stop   06/10/19 1830  cefTRIAXone (ROCEPHIN) 2 g in sodium chloride 0.9 % 100 mL IVPB     2 g 200 mL/hr over 30 Minutes Intravenous Every 24 hours 06/10/19 1826 06/15/19 1829   06/10/19 1830  azithromycin (ZITHROMAX) 500 mg in  sodium chloride 0.9 % 250 mL IVPB     500 mg 250 mL/hr over 60 Minutes Intravenous Every 24 hours 06/10/19 1826 06/15/19 1829     Subjective: Seen and examined at bedside he is still very dyspneic.  No nausea or vomiting.  Still not back to baseline and has continued wheezing.  Chest pain has improved however.  No other concerns or complaints at this time.  Objective: Vitals:   06/10/19 1126 06/10/19 1544 06/10/19 2101 06/10/19 2203  BP: 140/81 (!) 129/59  128/72  Pulse: 96 89  79  Resp: 18 18    Temp: 98.4 F (36.9 C) 97.9 F (36.6 C)  98.5 F (36.9 C)  TempSrc: Oral Oral  Oral  SpO2: 93% 92% 93% 91%  Weight:      Height:        Intake/Output Summary (Last 24 hours) at 06/11/2019 0737 Last data filed at 06/11/2019 0600 Gross per 24 hour  Intake 1762.99 ml  Output 2200  ml  Net -437.01 ml   Filed Weights   06/09/19 2117 06/10/19 0433  Weight: 96.2 kg 92.9 kg   Examination: Physical Exam:  Constitutional: WN/WD overweight Caucasian male currently in NAD and appears mildly dyspneic and slightly anxious Eyes: Lids and conjunctivae normal, sclerae anicteric  ENMT: External Ears, Nose appear normal. Grossly normal hearing.  Neck: Appears normal, supple, no cervical masses, normal ROM, no appreciable thyromegaly; no JVD Respiratory: Diminished to auscultation bilaterally with coarse breath sounds and expiratory wheezing diffusely scattered.  Has a mildly increased respiratory rate and effort is wearing 3 L of supplemental oxygen via nasal cannula. Cardiovascular: RRR, no murmurs / rubs / gallops. S1 and S2 auscultated. Trace extremity edema.  Abdomen: Soft, non-tender, distended secondary body habitus.  Bowel sounds present GU: Deferred. Musculoskeletal: No clubbing / cyanosis of digits/nails. No joint deformity upper and lower extremities.  Skin: No rashes, lesions, ulcers on limited skin evaluation. No induration; Warm and dry.  Neurologic: CN 2-12 grossly intact with no focal  deficits. Romberg sign and cerebellar reflexes not assessed.  Psychiatric: Normal judgment and insight. Alert and oriented x 3.  Mildly anxious mood and appropriate affect.   Data Reviewed: I have personally reviewed following labs and imaging studies  CBC: Recent Labs  Lab 06/09/19 2119 06/10/19 0859 06/11/19 0527  WBC 8.6 9.3 15.6*  NEUTROABS 4.9 8.8* 13.1*  HGB 14.0 14.0 13.7  HCT 44.1 44.0 43.0  MCV 92.5 91.5 91.1  PLT 216 220 237   Basic Metabolic Panel: Recent Labs  Lab 06/09/19 2119 06/10/19 0719 06/11/19 0527  NA 139 136 140  K 4.9 4.8 4.5  CL 98 94* 97*  CO2 35* 33* 34*  GLUCOSE 270* 370* 161*  BUN CREATININE 1.08 1.04 0.92  CALCIUM 8.5* 8.6* 9.1  MG  --  1.9 1.9  PHOS  --  3.8 4.1   GFR: Estimated Creatinine Clearance: 86.7 mL/min (by C-G formula based on SCr of 0.92 mg/dL). Liver Function Tests: Recent Labs  Lab 06/10/19 0719 06/11/19 0527  AST 17 11*  ALT 12 9  ALKPHOS 80 73  BILITOT 0.5 0.5  PROT 6.0* 5.8*  ALBUMIN 3.1* 2.9*   No results for input(s): LIPASE, AMYLASE in the last 168 hours. No results for input(s): AMMONIA in the last 168 hours. Coagulation Profile: No results for input(s): INR, PROTIME in the last 168 hours. Cardiac Enzymes: No results for input(s): CKTOTAL, CKMB, CKMBINDEX, TROPONINI in the last 168 hours. BNP (last 3 results) No results for input(s): PROBNP in the last 8760 hours. HbA1C: Recent Labs    06/10/19 0714  HGBA1C 7.9*   CBG: Recent Labs  Lab 06/10/19 0845 06/10/19 1122 06/10/19 1624 06/10/19 2053 06/11/19 0057  GLUCAP 355* 359* 239* 283* 236*   Lipid Profile: Recent Labs    06/10/19 0719  CHOL 110  HDL 44  LDLCALC 53  TRIG 65  CHOLHDL 2.5   Thyroid Function Tests: No results for input(s): TSH, T4TOTAL, FREET4, T3FREE, THYROIDAB in the last 72 hours. Anemia Panel: No results for input(s): VITAMINB12, FOLATE, FERRITIN, TIBC, IRON, RETICCTPCT in the last 72 hours. Sepsis  Labs: Recent Labs  Lab 06/10/19 1848 06/10/19 2029  PROCALCITON <0.10  --   LATICACIDVEN 1.8 1.5    Recent Results (from the past 240 hour(s))  SARS Coronavirus 2 by RT PCR (hospital order, performed in [facility] hospital lab) Nasopharyngeal Nasopharyngeal Swab     Status: None   Collection Time: 06/09/19 10:09  PM   Specimen: Nasopharyngeal Swab  Result Value Ref Range Status   SARS Coronavirus 2 NEGATIVE NEGATIVE Final    Comment: (NOTE) SARS-CoV-2 target nucleic acids are NOT DETECTED. The SARS-CoV-2 RNA is generally detectable in upper and lower respiratory specimens during the acute phase of infection. The lowest concentration of SARS-CoV-2 viral copies this assay can detect is 250 copies / mL. A negative result does not preclude SARS-CoV-2 infection and should not be used as the sole basis for treatment or other patient management decisions.  A negative result may occur with improper specimen collection / handling, submission of specimen other than nasopharyngeal swab, presence of viral mutation(s) within the areas targeted by this assay, and inadequate number of viral copies (<250 copies / mL). A negative result must be combined with clinical observations, patient history, and epidemiological information. Fact Sheet for Patients:   BoilerBrush.com.cy Fact Sheet for Healthcare Providers: https://pope.com/ This test is not yet approved or cleared  by the [place] FDA and has been authorized for detection and/or diagnosis of SARS-CoV-2 by FDA under an Emergency Use Authorization (EUA).  This EUA will remain in effect (meaning this test can be used) for the duration of the COVID-19 declaration under Section 564(b)(1) of the Act, 21 U.S.C. section 360bbb-3(b)(1), unless the authorization is terminated or revoked sooner. Performed at [facility], [address] [place], [place] [address]   MRSA PCR Screening      Status: Abnormal   Collection Time: 06/10/19  5:48 AM   Specimen: Nasal Mucosa; Nasopharyngeal  Result Value Ref Range Status   MRSA by PCR POSITIVE (A) NEGATIVE Final    Comment:        The GeneXpert MRSA Assay (FDA approved for NASAL specimens only), is one component of a comprehensive MRSA colonization surveillance program. It is not intended to diagnose MRSA infection nor to guide or monitor treatment for MRSA infections. RESULT CALLED TO, READ BACK BY AND VERIFIED WITHKaris Juba RN AT [phone] ON 06/10/19 [facility] Performed at [facility], [address] [place], [place] [address]   Expectorated sputum assessment w rflx to resp cult     Status: None   Collection Time: 06/11/19  5:20 AM   Specimen: Expectorated Sputum  Result Value Ref Range Status   Specimen Description EXPECTORATED SPUTUM  Final   Special Requests NONE  Final   Sputum evaluation   Final    THIS SPECIMEN IS ACCEPTABLE FOR SPUTUM CULTURE Performed at [facility], [address] [place], [place] [address]    Report Status 06/11/2019 FINAL  Final     RN Pressure Injury Documentation:     Estimated body mass index is 27.78 kg/m as calculated from the following:   Height as of this encounter: 6' (1.829 m).   Weight as of this encounter: 92.9 kg.  Malnutrition Type:      Malnutrition Characteristics:      Nutrition Interventions:    Radiology Studies: DG Chest Port 1 View  Result Date: 06/09/2019 CLINICAL DATA:  Dyspnea EXAM: PORTABLE CHEST 1 VIEW COMPARISON:  05/25/2019 FINDINGS: Cardiac shadow is within normal limits. Aortic calcifications are noted. Spinal stimulator and left subclavian arterial stent are noted. Lungs are hyperinflated consistent with COPD. Bibasilar scarring is noted. No acute infiltrate is noted. IMPRESSION: COPD and bibasilar scarring.  No other focal abnormality is noted. Electronically Signed   By: Alcide Clever M.D.   On: 06/09/2019 21:37     Scheduled Meds: . aspirin  EC  81 mg Oral Daily  . atorvastatin  80 mg Oral QHS  . busPIRone  10 mg Oral BID  . Chlorhexidine Gluconate Cloth  6 each Topical Q0600  . DULoxetine  60 mg Oral Daily  . guaiFENesin  600 mg Oral BID  . insulin aspart  0-15 Units Subcutaneous Q4H  . insulin aspart  4 Units Subcutaneous TID WC  . insulin glargine  26 Units Subcutaneous QHS  . ipratropium-albuterol  3 mL Nebulization Q6H  . lamoTRIgine  100 mg Oral Daily  . methylPREDNISolone (SOLU-MEDROL) injection  60 mg Intravenous Q12H  . metoprolol succinate  25 mg Oral Daily  . mupirocin ointment  1 application Nasal BID  . nicotine  21 mg Transdermal Daily  . pregabalin  200 mg Oral TID  . rivaroxaban  20 mg Oral Q supper   Continuous Infusions: . sodium chloride 1,000 mL (06/10/19 2208)  . azithromycin 500 mg (06/10/19 1858)  . cefTRIAXone (ROCEPHIN)  IV 2 g (06/10/19 2223)    LOS: 1 day   Kerney Elbe, DO [facility] PAGER is on Eugenio Saenz  If 7PM-7AM, please contact night-coverage [email or web address]

## 2019-06-11 NOTE — Progress Notes (Signed)
Initial Nutrition Assessment  DOCUMENTATION CODES:   Not applicable  INTERVENTION:  Provide Ensure Max Protein po BID, each supplement provides 150 kcal and 30 grams of protein. Patient prefers vanilla.  NUTRITION DIAGNOSIS:   Increased nutrient needs related to catabolic illness(COPD) as evidenced by estimated needs.  GOAL:   Patient will meet greater than or equal to 90% of their needs  MONITOR:   PO intake, Supplement acceptance, Labs, Weight trends, I & O's  REASON FOR ASSESSMENT:   Consult Assessment of nutrition requirement/status  ASSESSMENT:   64 year old male with PMHx of HTN, DM, chronic pain syndrome, GERD, CAD, nutcracker esophagus admitted with acute exacerbation of COPD.   Met with patient at bedside. He reports his appetite has been decreased in the past 3 days due to difficulty breathing. He is eating less at meals but reports intake is variable. According to review of chart patient ate 100% of breakfast yesterday, 80% of lunch yesterday, and then 25% of dinner last night. He reports he has a good appetite and intake at baseline and that he typically eats 3 good meals per day. He typically follows a low sodium, heart healthy diet at home. Patient reports he does not have a hx of DM. He reports his CBGs are only high now because of the steroids. Patient is amenable to drinking an oral nutrition supplement to help meet calorie/protein needs. He prefers vanilla flavor. Patient reports he uses a wheelchair at baseline.  Patient reports he is weight-stable and denies any recent weight loss. He reports his UBW is 210-212 lbs. Of note patient is currently 92.9 kg (204.81 lbs) according to chart. He is unsure when this weight loss may have occurred. Bed scale could have also been a little off with zeroing.  Medications reviewed and include: vitamin D3 2000 units daily, Novolog 0-15 units Q4hrs, Novolog 4 units TID, Lantus 26 units QHS, Solu-Medrol 60 mg Q8hrs IV, nicotine  patch, Protonix, azithromycin, ceftriaxone.  Labs reviewed: CBG 176-308, Chloride 97, CO2 34.  Patient is at risk for malnutrition. Muscle wasting noted in bilateral lower extremities can be attributed to wheelchair use and limited ambulation.  NUTRITION - FOCUSED PHYSICAL EXAM:    Most Recent Value  Orbital Region  No depletion  Upper Arm Region  Mild depletion  Thoracic and Lumbar Region  No depletion  Buccal Region  No depletion  Temple Region  Mild depletion  Clavicle Bone Region  No depletion  Clavicle and Acromion Bone Region  No depletion  Scapular Bone Region  No depletion  Dorsal Hand  Mild depletion  Patellar Region  Moderate depletion  Anterior Thigh Region  Moderate depletion  Posterior Calf Region  Severe depletion  Edema (RD Assessment)  None  Hair  Reviewed  Eyes  Reviewed  Mouth  Reviewed  Skin  Reviewed [ecchymosis]  Nails  Reviewed     Diet Order:   Diet Order            Diet Heart Room service appropriate? Yes; Fluid consistency: Thin  Diet effective now             EDUCATION NEEDS:   No education needs have been identified at this time  Skin:  Skin Assessment: Reviewed RN Assessment(ecchymosis)  Last BM:  Unknown  Height:   Ht Readings from Last 1 Encounters:  06/10/19 6' (1.829 m)   Weight:   Wt Readings from Last 1 Encounters:  06/10/19 92.9 kg   BMI:  Body mass index is  27.78 kg/m.  Estimated Nutritional Needs:   Kcal:  2300-2500  Protein:  115-125 grams  Fluid:  >/= 2.3 L/day  Jacklynn Barnacle, MS, RD, LDN Pager number available on Amion

## 2019-06-12 ENCOUNTER — Encounter: Payer: Self-pay | Admitting: Internal Medicine

## 2019-06-12 ENCOUNTER — Inpatient Hospital Stay: Payer: Medicare Other

## 2019-06-12 LAB — CBC WITH DIFFERENTIAL/PLATELET
Abs Immature Granulocytes: 0.13 10*3/uL — ABNORMAL HIGH (ref 0.00–0.07)
Basophils Absolute: 0 10*3/uL (ref 0.0–0.1)
Basophils Relative: 0 %
Eosinophils Absolute: 0 10*3/uL (ref 0.0–0.5)
Eosinophils Relative: 0 %
HCT: 43.1 % (ref 39.0–52.0)
Hemoglobin: 14 g/dL (ref 13.0–17.0)
Immature Granulocytes: 1 %
Lymphocytes Relative: 5 %
Lymphs Abs: 0.7 10*3/uL (ref 0.7–4.0)
MCH: 29.2 pg (ref 26.0–34.0)
MCHC: 32.5 g/dL (ref 30.0–36.0)
MCV: 90 fL (ref 80.0–100.0)
Monocytes Absolute: 0.6 10*3/uL (ref 0.1–1.0)
Monocytes Relative: 4 %
Neutro Abs: 12.8 10*3/uL — ABNORMAL HIGH (ref 1.7–7.7)
Neutrophils Relative %: 90 %
Platelets: 232 10*3/uL (ref 150–400)
RBC: 4.79 MIL/uL (ref 4.22–5.81)
RDW: 15.5 % (ref 11.5–15.5)
WBC: 14.2 10*3/uL — ABNORMAL HIGH (ref 4.0–10.5)
nRBC: 0 % (ref 0.0–0.2)

## 2019-06-12 LAB — COMPREHENSIVE METABOLIC PANEL
ALT: 11 U/L (ref 0–44)
AST: 12 U/L — ABNORMAL LOW (ref 15–41)
Albumin: 3 g/dL — ABNORMAL LOW (ref 3.5–5.0)
Alkaline Phosphatase: 68 U/L (ref 38–126)
Anion gap: 10 (ref 5–15)
BUN: 22 mg/dL (ref 8–23)
CO2: 34 mmol/L — ABNORMAL HIGH (ref 22–32)
Calcium: 9.1 mg/dL (ref 8.9–10.3)
Chloride: 93 mmol/L — ABNORMAL LOW (ref 98–111)
Creatinine, Ser: 0.94 mg/dL (ref 0.61–1.24)
GFR calc Af Amer: >60 mL/min (ref >60)
GFR calc non Af Amer: >60 mL/min (ref >60)
Glucose, Bld: 214 mg/dL — ABNORMAL HIGH (ref 70–99)
Potassium: 4.9 mmol/L (ref 3.5–5.1)
Sodium: 137 mmol/L (ref 135–145)
Total Bilirubin: 0.5 mg/dL (ref 0.3–1.2)
Total Protein: 5.8 g/dL — ABNORMAL LOW (ref 6.5–8.1)

## 2019-06-12 LAB — GLUCOSE, CAPILLARY
Glucose-Capillary: 135 mg/dL — ABNORMAL HIGH (ref 70–99)
Glucose-Capillary: 182 mg/dL — ABNORMAL HIGH (ref 70–99)
Glucose-Capillary: 216 mg/dL — ABNORMAL HIGH (ref 70–99)
Glucose-Capillary: 219 mg/dL — ABNORMAL HIGH (ref 70–99)
Glucose-Capillary: 222 mg/dL — ABNORMAL HIGH (ref 70–99)
Glucose-Capillary: 279 mg/dL — ABNORMAL HIGH (ref 70–99)
Glucose-Capillary: 320 mg/dL — ABNORMAL HIGH (ref 70–99)

## 2019-06-12 LAB — PROCALCITONIN: Procalcitonin: <0.1 ng/mL

## 2019-06-12 LAB — LEGIONELLA PNEUMOPHILA SEROGP 1 UR AG: L. pneumophila Serogp 1 Ur Ag: NEGATIVE

## 2019-06-12 LAB — PHOSPHORUS: Phosphorus: 4.2 mg/dL (ref 2.5–4.6)

## 2019-06-12 LAB — MAGNESIUM: Magnesium: 2.1 mg/dL (ref 1.7–2.4)

## 2019-06-12 MED ORDER — METHYLPREDNISOLONE SODIUM SUCC 40 MG IJ SOLR
40.0000 mg | Freq: Three times a day (TID) | INTRAMUSCULAR | Status: DC
  Administered 2019-06-12 – 2019-06-13 (×3): 40 mg via INTRAVENOUS
  Filled 2019-06-12 (×3): qty 1

## 2019-06-12 MED ORDER — IOHEXOL 350 MG/ML SOLN
75.0000 mL | Freq: Once | INTRAVENOUS | Status: AC | PRN
  Administered 2019-06-12: 75 mL via INTRAVENOUS

## 2019-06-12 MED ORDER — INSULIN ASPART 100 UNIT/ML ~~LOC~~ SOLN
0.0000 [IU] | Freq: Three times a day (TID) | SUBCUTANEOUS | Status: DC
  Administered 2019-06-12: 2 [IU] via SUBCUTANEOUS
  Administered 2019-06-13: 5 [IU] via SUBCUTANEOUS
  Administered 2019-06-13: 8 [IU] via SUBCUTANEOUS
  Administered 2019-06-13: 5 [IU] via SUBCUTANEOUS
  Administered 2019-06-14: 11 [IU] via SUBCUTANEOUS
  Administered 2019-06-14: 5 [IU] via SUBCUTANEOUS
  Filled 2019-06-12 (×6): qty 1

## 2019-06-12 MED ORDER — TRAZODONE HCL 100 MG PO TABS
100.0000 mg | ORAL_TABLET | Freq: Every evening | ORAL | Status: DC | PRN
  Administered 2019-06-13 – 2019-06-14 (×2): 100 mg via ORAL
  Filled 2019-06-12 (×2): qty 1

## 2019-06-12 MED ORDER — INSULIN ASPART 100 UNIT/ML ~~LOC~~ SOLN
0.0000 [IU] | Freq: Every day | SUBCUTANEOUS | Status: DC
  Administered 2019-06-12: 2 [IU] via SUBCUTANEOUS
  Administered 2019-06-13: 3 [IU] via SUBCUTANEOUS
  Filled 2019-06-12 (×2): qty 1

## 2019-06-12 MED ORDER — ROFLUMILAST 500 MCG PO TABS
250.0000 ug | ORAL_TABLET | Freq: Every day | ORAL | Status: DC
  Administered 2019-06-12 – 2019-06-14 (×3): 250 ug via ORAL
  Filled 2019-06-12 (×4): qty 1

## 2019-06-12 MED ORDER — INSULIN ASPART 100 UNIT/ML ~~LOC~~ SOLN
5.0000 [IU] | Freq: Three times a day (TID) | SUBCUTANEOUS | Status: DC
  Administered 2019-06-12 – 2019-06-13 (×2): 5 [IU] via SUBCUTANEOUS
  Filled 2019-06-12 (×3): qty 1

## 2019-06-12 NOTE — Progress Notes (Signed)
PROGRESS NOTE    Gregory Hall  VOZ:[id number] DOB: [birth year] DOA: 06/09/2019 PCP: Armando Gang, FNP   Brief Narrative:  HPI per Dr. Lindajo Hall on 06/09/19 Gregory Hall is a 64 y.o. male with medical history significant for COPD on home O2 at 2 L, CAD with history of PCI of the LAD in 2010, last stress test 2017 low risk, chronic pain as well as history of anxiety on anxiolytics, who presents to the emergency room with a 1-day history of wheezing, not improving with home bronchodilator therapy.  He has associated chest congestion and initially on retrosternal chest tightness of moderate intensity, nonradiating.  Has no associated nausea vomiting diaphoresis, lightheadedness or palpitations.  Denies cough fever or chills.  ED Course: On arrival, patient was afebrile, tachycardic to 24 with O2 sat 88% on home flow rate of 2 L improving to the mid 90s on 4 L.  Vitals were otherwise unremarkable.  Troponin was 5 with a BNP of 51.  Venous blood gas showed PCO2 of 68.[name] cell count normal at 8600.  Noted to have an elevated blood sugar of 272 with no known history of diabetes.  Chest x-ray showed no acute disease.  Patient was treated with several rounds of DuoNeb but continued to have increased work of breathing.  Hospitalist consulted for admission.  **Interim History Continues to be dyspneic and when to use supplemental oxygen via nasal cannula.  Has minimal improvement.  We adjusted his medications further and since he is not improving significantly will obtain a pulmonary consult and a CT of the chest to rule out PE.  Assessment & Plan:   Principal Problem:   Acute on chronic respiratory failure with hypoxia and hypercapnia (HCC) Active Problems:   Chest pain   COPD with acute exacerbation (HCC)   CAD (coronary artery disease)   HTN (hypertension)   Anxiety   Chronic prescription opiate use   Hyperglycemia   Diabetes mellitus type 2, uncontrolled, with  complications (HCC)   Chronic pain syndrome   COPD exacerbation (HCC)  Acute on chronic respiratory failure with hypoxia and hypercapnia in the setting of Acute COPD exacerbation -5-day course antibiotics with Ceftriaxone and Azithromycin  -Solu-Medrol 60 mg BID increased to TID given persistent wheezing yesterday  -DuoNeb scheduled q6h and C/w Albuterol 2.5 mg Neb q2hprn Wheezing and SOB -Add Budesonide 0.25 mg BID and Arformoterol 15 mcg Neb BID  -Takes Trelegy Ellipta 1 puff into the lungs daily at home -Flutter valve -Incentive spirometer -Increase Guaifenesin to 1200 mg po BID; States he is expectorating more now -Restarted Home Lasix 20 mg po Daily PRN -6/2 lactic acid/procalcitonin Negative -SpO2: 94 % O2 Flow Rate (L/min): 4 L/min; however wears 3 L at home -C/w Droplet Precautions and will get Respiratory Virus Panel   -We will need an ambulatory home O2 screen prior to discharge -Continuous pulse oximetry and maintain O2 saturations greater than 90% -Since he is not improving will obtain a CTA of the chest to rule out PE and obtain a pulmonary consult -CTA of the Chest showed "Negative examination for pulmonary embolism.  Generally bandlike dependent bibasilar atelectasis or consolidations, which may reflect bland atelectasis or alternately infection or aspiration. Diffuse bilateral bronchial wall thickening, consistent with nonspecific infectious or inflammatory bronchitis. Emphysema. Coronary artery disease. Aortic Atherosclerosis." -Repeat CXR this AM showed "Stable bibasilar scarring.  No other focal abnormality is noted." -Follow up on Pulmonary Recommendations   Chest Pain -Trend troponin most likely secondary  to COPD exacerbation but given his cardiac history we will check -Intial Troponin was 13 and repeat was 11 -Hx PCI to LAD 2010 -NTG sublingual PRN, morphine for breakthrough  DM type II uncontrolled with complication -08/24/2011 hemoglobin A1c= 8.3 -Hemoglobin  A1c 7.9 this visit -Hold home Metformin 500 g p.o. daily with breakfast -Lipid panel done and unremarkable  -Lantus 26 units daily -NovoLog 4 Units TID will be increased to 5 units TID if patient eats >50% of his meals -Change Moderate SSI q4h to AC/HS -We will consult diabetes education coordinator for further evaluation recommendations given that we are increasing his steroids from 60 mg IV every 12 to every 8 hours -CBGs have been ranging from 137-308 but the last 24 hours have been ranging from 182-320  Anxiety/Depression/Bipolar? -Continue with buspirone 10 mg BID -Continue with duloxetine 60 mg daily -Continue Lamictal 100 mg daily -Continue with pregabalin 201 p.o. 3 times daily along   Dyslipidemia -Lipid panel was normal and showed total cholesterol/HDL ratio 2.5, cholesterol level of 110, HDL 44, LDL of 53, triglycerides of 65, VLDL 13 -Continue atorvastatin 80 mg p.o. nightly  GERD -Continue PPI with Pantoprazole 40 g daily  Leukocytosis -The setting of steroid demargination -Patient CBC went from 9.3 peaked at 15.6 and is now improing and is 14.2 -PCT has been <0.10 x2 -Continue to monitor for signs and symptoms of infection  -Continue with antibiotics as above  -Repeat CBC in the a.m.  Chronic pain on Chronic Opiates -Continue with as needed oxycodone and I have increased the dose from 10 mg every 6 hours as needed to every 4 hours as needed given that this is his home dose -Lyrica 200 mg TID -Holding his home cyclobenzaprine 5 mg p.o. 3 times daily as needed for muscle spasms for now  Tobacco Abuse -C/w Nicotine patch -Dr. Joseph Art discussed at length about the reason patient need to discontinue smoking to include DEATH  Insomnia -Resume his home trazodone 100 mg p.o. nightly as needed   DVT prophylaxis: Anticoagulated with Rivaroxaban daily with supper Code Status: FULL CODE Family Communication: No family present at bedside  Disposition Plan: Remain  inpatient given his continued dyspnea shortness of breath not back to baseline; Will need Home Health PT/OT at D/C  Status is: Inpatient  Remains inpatient appropriate because:IV treatments appropriate due to intensity of illness or inability to take PO and Inpatient level of care appropriate due to severity of illness   Dispo: The patient is from: Home              Anticipated d/c is to: Home              Anticipated d/c date is: 1 day              Patient currently is not medically stable to d/c.  Consultants:   Pulmonary Dr. Vida Rigger  Procedures: CTA    Antimicrobials:  Anti-infectives (From admission, onward)   Start     Dose/Rate Route Frequency Ordered Stop   06/10/19 1830  cefTRIAXone (ROCEPHIN) 2 g in sodium chloride 0.9 % 100 mL IVPB     2 g 200 mL/hr over 30 Minutes Intravenous Every 24 hours 06/10/19 1826 06/15/19 1829   06/10/19 1830  azithromycin (ZITHROMAX) 500 mg in sodium chloride 0.9 % 250 mL IVPB     500 mg 250 mL/hr over 60 Minutes Intravenous Every 24 hours 06/10/19 1826 06/15/19 1829     Subjective: Seen and examined at  bedside and he feels a little bit better but still states that he is short of breath and still having a hard time coughing up his sputum.  States that he was successful last night and coughed up some but still states that he is still having some difficulty coughing up today.  Continues to wheeze and continues to be short of breath.  No nausea or vomiting.  No other concerns or complaints at this time.  Objective: Vitals:   06/11/19 1423 06/11/19 1543 06/11/19 2105 06/12/19 0052  BP:  140/68  (!) 144/70  Pulse:  72  79  Resp:  18  16  Temp:  98.9 F (37.2 C)  98.2 F (36.8 C)  TempSrc:  Oral  Oral  SpO2: 95% 93% 92% 94%  Weight:      Height:        Intake/Output Summary (Last 24 hours) at 06/12/2019 0749 Last data filed at 06/12/2019 6948 Gross per 24 hour  Intake 325.99 ml  Output 2125 ml  Net -1799.01 ml   Filed Weights    06/09/19 2117 06/10/19 0433  Weight: 96.2 kg 92.9 kg   Examination: Physical Exam:  Constitutional: WN/WD overweight Caucasian male currently no acute distress appears somewhat dyspneic still and anxious as well Eyes: Lids and conjunctivae normal, sclerae anicteric  ENMT: External Ears, Nose appear normal. Grossly normal hearing. Neck: Appears normal, supple, no cervical masses, normal ROM, no appreciable thyromegaly; no JVD Respiratory: Diminished to auscultation bilaterally with coarse breath sounds and some bilateral expiratory wheezing.  He is wearing supplemental oxygen via nasal cannula and appears to be slightly tachypneic. Cardiovascular: RRR, no murmurs / rubs / gallops. S1 and S2 auscultated.  Minimal extremity edema.  Abdomen: Soft, non-tender, distended secondary to body habitus. Bowel sounds positive.  GU: Deferred. Musculoskeletal: No clubbing / cyanosis of digits/nails. No joint deformity upper and lower extremities.  Skin: No rashes, lesions, ulcers on limited skin evaluation. No induration; Warm and dry.  Neurologic: CN 2-12 grossly intact with no focal deficits. Romberg sign and cerebellar reflexes not assessed.  Psychiatric: Normal judgment and insight. Alert and oriented x 3.  Minimally anxious mood and appropriate affect.   Data Reviewed: I have personally reviewed following labs and imaging studies  CBC: Recent Labs  Lab 06/09/19 2119 06/10/19 0859 06/11/19 0527 06/12/19 0532  WBC 8.6 9.3 15.6* 14.2*  NEUTROABS 4.9 8.8* 13.1* 12.8*  HGB 14.0 14.0 13.7 14.0  HCT 44.1 44.0 43.0 43.1  MCV 92.5 91.5 91.1 90.0  PLT 216 220 237 546   Basic Metabolic Panel: Recent Labs  Lab 06/09/19 2119 06/10/19 0719 06/11/19 0527 06/12/19 0532  NA 139 136 140 137  K 4.9 4.8 4.5 4.9  CL 98 94* 97* 93*  CO2 35* 33* 34* 34*  GLUCOSE 270* 370* 161* 214*  BUN 10 14 17 22   CREATININE 1.08 1.04 0.92 0.94  CALCIUM 8.5* 8.6* 9.1 9.1  MG  --  1.9 1.9 2.1  PHOS  --  3.8 4.1  4.2   GFR: Estimated Creatinine Clearance: 84.8 mL/min (by C-G formula based on SCr of 0.94 mg/dL). Liver Function Tests: Recent Labs  Lab 06/10/19 0719 06/11/19 0527 06/12/19 0532  AST 17 11* 12*  ALT 12 9 11   ALKPHOS 80 73 68  BILITOT 0.5 0.5 0.5  PROT 6.0* 5.8* 5.8*  ALBUMIN 3.1* 2.9* 3.0*   No results for input(s): LIPASE, AMYLASE in the last 168 hours. No results for input(s): AMMONIA in the last 168  hours. Coagulation Profile: No results for input(s): INR, PROTIME in the last 168 hours. Cardiac Enzymes: No results for input(s): CKTOTAL, CKMB, CKMBINDEX, TROPONINI in the last 168 hours. BNP (last 3 results) No results for input(s): PROBNP in the last 8760 hours. HbA1C: Recent Labs    06/10/19 0714  HGBA1C 7.9*   CBG: Recent Labs  Lab 06/11/19 1139 06/11/19 1543 06/11/19 2129 06/12/19 0049 06/12/19 0453  GLUCAP 308* 230* 216* 219* 182*   Lipid Profile: Recent Labs    06/10/19 0719  CHOL 110  HDL 44  LDLCALC 53  TRIG 65  CHOLHDL 2.5   Thyroid Function Tests: No results for input(s): TSH, T4TOTAL, FREET4, T3FREE, THYROIDAB in the last 72 hours. Anemia Panel: No results for input(s): VITAMINB12, FOLATE, FERRITIN, TIBC, IRON, RETICCTPCT in the last 72 hours. Sepsis Labs: Recent Labs  Lab 06/10/19 1848 06/10/19 2029 06/11/19 0527 06/12/19 0532  PROCALCITON <0.10  --  <0.10 <0.10  LATICACIDVEN 1.8 1.5  --   --     Recent Results (from the past 240 hour(s))  SARS Coronavirus 2 by RT PCR (hospital order, performed in [facility] hospital lab) Nasopharyngeal Nasopharyngeal Swab     Status: None   Collection Time: 06/09/19 10:09 PM   Specimen: Nasopharyngeal Swab  Result Value Ref Range Status   SARS Coronavirus 2 NEGATIVE NEGATIVE Final    Comment: (NOTE) SARS-CoV-2 target nucleic acids are NOT DETECTED. The SARS-CoV-2 RNA is generally detectable in upper and lower respiratory specimens during the acute phase of infection. The  lowest concentration of SARS-CoV-2 viral copies this assay can detect is 250 copies / mL. A negative result does not preclude SARS-CoV-2 infection and should not be used as the sole basis for treatment or other patient management decisions.  A negative result may occur with improper specimen collection / handling, submission of specimen other than nasopharyngeal swab, presence of viral mutation(s) within the areas targeted by this assay, and inadequate number of viral copies (<250 copies / mL). A negative result must be combined with clinical observations, patient history, and epidemiological information. Fact Sheet for Patients:   BoilerBrush.com.cy Fact Sheet for Healthcare Providers: https://pope.com/ This test is not yet approved or cleared  by the [place] FDA and has been authorized for detection and/or diagnosis of SARS-CoV-2 by FDA under an Emergency Use Authorization (EUA).  This EUA will remain in effect (meaning this test can be used) for the duration of the COVID-19 declaration under Section 564(b)(1) of the Act, 21 U.S.C. section 360bbb-3(b)(1), unless the authorization is terminated or revoked sooner. Performed at [facility], [address] [place], [place] [address]   MRSA PCR Screening     Status: Abnormal   Collection Time: 06/10/19  5:48 AM   Specimen: Nasal Mucosa; Nasopharyngeal  Result Value Ref Range Status   MRSA by PCR POSITIVE (A) NEGATIVE Final    Comment:        The GeneXpert MRSA Assay (FDA approved for NASAL specimens only), is one component of a comprehensive MRSA colonization surveillance program. It is not intended to diagnose MRSA infection nor to guide or monitor treatment for MRSA infections. RESULT CALLED TO, READ BACK BY AND VERIFIED WITHKaris Juba RN AT [phone] ON 06/10/19 [employer] Performed at [facility], [address] [place], [place] [address]   Expectorated  sputum assessment w rflx to resp cult     Status: None   Collection Time: 06/11/19  5:20 AM   Specimen: Expectorated Sputum  Result Value Ref Range Status   Specimen Description EXPECTORATED SPUTUM  Final   Special Requests NONE  Final   Sputum evaluation   Final    THIS SPECIMEN IS ACCEPTABLE FOR SPUTUM CULTURE Performed at [facility], [address] [place], [place] [address]    Report Status 06/11/2019 FINAL  Final  Resp Panel by RT PCR (RSV, Flu A&B, Covid) - Urine, Clean Catch     Status: None   Collection Time: 06/11/19  9:03 AM   Specimen: Urine, Clean Catch  Result Value Ref Range Status   SARS Coronavirus 2 by RT PCR NEGATIVE NEGATIVE Final    Comment: (NOTE) SARS-CoV-2 target nucleic acids are NOT DETECTED. The SARS-CoV-2 RNA is generally detectable in upper respiratoy specimens during the acute phase of infection. The lowest concentration of SARS-CoV-2 viral copies this assay can detect is 131 copies/mL. A negative result does not preclude SARS-Cov-2 infection and should not be used as the sole basis for treatment or other patient management decisions. A negative result may occur with  improper specimen collection/handling, submission of specimen other than nasopharyngeal swab, presence of viral mutation(s) within the areas targeted by this assay, and inadequate number of viral copies (<131 copies/mL). A negative result must be combined with clinical observations, patient history, and epidemiological information. The expected result is Negative. Fact Sheet for Patients:  https://www.moore.com/ Fact Sheet for Healthcare Providers:  https://www.young.biz/ This test is not yet ap proved or cleared by the [place] FDA and  has been authorized for detection and/or diagnosis of SARS-CoV-2 by FDA under an Emergency Use Authorization (EUA). This EUA will remain  in effect (meaning this test can be used) for the duration  of the COVID-19 declaration under Section 564(b)(1) of the Act, 21 U.S.C. section 360bbb-3(b)(1), unless the authorization is terminated or revoked sooner.    Influenza A by PCR NEGATIVE NEGATIVE Final   Influenza B by PCR NEGATIVE NEGATIVE Final    Comment: (NOTE) The Xpert Xpress SARS-CoV-2/FLU/RSV assay is intended as an aid in  the diagnosis of influenza from Nasopharyngeal swab specimens and  should not be used as a sole basis for treatment. Nasal washings and  aspirates are unacceptable for Xpert Xpress SARS-CoV-2/FLU/RSV  testing. Fact Sheet for Patients: https://www.moore.com/ Fact Sheet for Healthcare Providers: https://www.young.biz/ This test is not yet approved or cleared by the [place] FDA and  has been authorized for detection and/or diagnosis of SARS-CoV-2 by  FDA under an Emergency Use Authorization (EUA). This EUA will remain  in effect (meaning this test can be used) for the duration of the  Covid-19 declaration under Section 564(b)(1) of the Act, 21  U.S.C. section 360bbb-3(b)(1), unless the authorization is  terminated or revoked.    Respiratory Syncytial Virus by PCR NEGATIVE NEGATIVE Final    Comment: (NOTE) Fact Sheet for Patients: https://www.moore.com/ Fact Sheet for Healthcare Providers: https://www.young.biz/ This test is not yet approved or cleared by the [place] FDA and  has been authorized for detection and/or diagnosis of SARS-CoV-2 by  FDA under an Emergency Use Authorization (EUA). This EUA will remain  in effect (meaning this test can be used) for the duration of the  COVID-19 declaration under Section 564(b)(1) of the Act, 21 U.S.C.  section 360bbb-3(b)(1), unless the authorization is terminated or  revoked. Performed at [facility], [address], [place], [place] [address]      RN Pressure Injury Documentation:     Estimated body mass  index is 27.78  kg/m as calculated from the following:   Height as of this encounter: 6' (1.829 m).   Weight as of this encounter: 92.9 kg.  Malnutrition Type:  Nutrition Problem: Increased nutrient needs Etiology: catabolic illness(COPD)   Malnutrition Characteristics:  Signs/Symptoms: estimated needs   Nutrition Interventions:  Interventions: Refer to RD note for recommendations Radiology Studies: DG Chest Port 1 View  Result Date: 06/12/2019 CLINICAL DATA:  Dyspnea EXAM: PORTABLE CHEST 1 VIEW COMPARISON:  06/09/2019 FINDINGS: Cardiac shadows within normal limits. Aortic calcifications are noted. Stimulator is noted over the midthoracic spine. Stable bibasilar scarring is noted. No new focal infiltrate is seen. Postsurgical changes in the cervical spine are noted. IMPRESSION: Stable bibasilar scarring.  No other focal abnormality is noted. Electronically Signed   By: Alcide Clever M.D.   On: 06/12/2019 01:26    Scheduled Meds: . arformoterol  15 mcg Nebulization BID  . aspirin EC  81 mg Oral Daily  . atorvastatin  80 mg Oral QHS  . budesonide (PULMICORT) nebulizer solution  0.25 mg Nebulization BID  . busPIRone  10 mg Oral BID  . Chlorhexidine Gluconate Cloth  6 each Topical Q0600  . cholecalciferol  2,000 Units Oral Daily  . DULoxetine  60 mg Oral Daily  . guaiFENesin  1,200 mg Oral BID  . insulin aspart  0-15 Units Subcutaneous Q4H  . insulin aspart  4 Units Subcutaneous TID WC  . insulin glargine  26 Units Subcutaneous QHS  . ipratropium-albuterol  3 mL Nebulization Q6H  . lamoTRIgine  100 mg Oral Daily  . methylPREDNISolone (SOLU-MEDROL) injection  60 mg Intravenous Q8H  . metoprolol succinate  25 mg Oral Daily  . mupirocin ointment  1 application Nasal BID  . nicotine  21 mg Transdermal Daily  . pantoprazole  40 mg Oral Daily  . pregabalin  200 mg Oral TID  . Ensure Max Protein  11 oz Oral BID BM  . rivaroxaban  20 mg Oral Q supper   Continuous Infusions: .  sodium chloride 1,000 mL (06/10/19 2208)  . azithromycin 500 mg (06/11/19 1825)  . cefTRIAXone (ROCEPHIN)  IV 2 g (06/11/19 1700)    LOS: 2 days   Merlene Laughter, DO [facility] PAGER is on AMION  If 7PM-7AM, please contact night-coverage [email or web address]

## 2019-06-12 NOTE — Progress Notes (Signed)
Inpatient Diabetes Program Recommendations  AACE/ADA: New Consensus Statement on Inpatient Glycemic Control (2015)  Target Ranges:  Prepandial:   less than 140 mg/dL      Peak postprandial:   less than 180 mg/dL (1-2 hours)      Critically ill patients:  140 - 180 mg/dL   Lab Results  Component Value Date   GLUCAP 320 (H) 06/12/2019   HGBA1C 7.9 (H) 06/10/2019    Review of Glycemic Control Results for Gregory Hall, Gregory Hall (MRN [id number]) as of 06/12/2019 10:34  Ref. Range 06/11/2019 21:29 06/12/2019 00:49 06/12/2019 04:53 06/12/2019 08:08 06/12/2019 09:24  Glucose-Capillary Latest Ref Range: 70 - 99 mg/dL 956 (H) 213 (H) 086 (H) 216 (H) 320 (H)   Diabetes history: DM2 Outpatient Diabetes medications: Lantus 26 units + Humalog 4 units tid meal coverage + Metformin 500 mg qd Current orders for Inpatient glycemic control: Lantus 26 units + Novolog 3 units tid + Novolog sensitive correction tid  Inpatient Diabetes Program Recommendations:    While on steroids: -Consider increase in Novolog meal coverage to 5 units tid if eats 50%  Thank you, Billy Fischer. Atalaya Zappia, RN, MSN, CDE  Diabetes Coordinator Inpatient Glycemic Control Team Team Pager [phone] (8am-5pm) 06/12/2019 10:45 AM

## 2019-06-12 NOTE — Consult Note (Signed)
Pulmonary Medicine          Date: 06/12/2019,   MRN# [id number] Gregory Hall [facility] 1955/10/18     AdmissionWeight: 96.2 kg                 CurrentWeight: 92.9 kg  Referring physician: Dr Marland Mcalpine    CHIEF COMPLAINT:   Acute exacerbation of COPD   HISTORY OF PRESENT ILLNESS   This is 64 year old pleasant male with a history of CAD status post stenting, chronic pain syndrome, cervicalgia, GERD, essential hypertension, neuropathy, dysphagia history of cholecystectomy who came in for worsening respiratory symptoms including wheezing refractory to inhaler therapy at home.  Additionally patient reports chest discomfort and cough however denies fever chills diaphoresis nausea or vomiting.  He was found to be hypoxemic in the 80s which improved with supplemental oxygen.  In the ED he was noted to be significantly hyperglycemic, chest x-ray did not show infiltrates masses or effusions.  CT chest was done with PE protocol, which was positive for severe bullous and centrilobular emphysema with chronic bronchitic phenotype of COPD as well as bibasilar atelectasis.   PAST MEDICAL HISTORY   Past Medical History:  Diagnosis Date  . CAD (coronary artery disease)    s/p PTCA and stent x2  . Chest pain   . Chronic pain syndrome   . Degenerative cervical disc   . Dyslipidemia   . GERD (gastroesophageal reflux disease)   . Hernia 2014  . Hypertension   . MRSA (methicillin resistant staph aureus) culture positive 2011  . Neuropathy   . Nutcracker esophagus   . Rectus diastasis 07/19/2012     SURGICAL HISTORY   Past Surgical History:  Procedure Laterality Date  . BACK SURGERY  2012  . CHOLECYSTECTOMY    . COLONOSCOPY  Jan 2014   [name]  . CORONARY ANGIOPLASTY WITH STENT PLACEMENT  2009   stents x2, in [place], [place]  . FOOT SURGERY     Right  . NECK SURGERY    . SPINE SURGERY  2012,2013  . TONSILLECTOMY       FAMILY HISTORY   Family History  Family history  unknown: Yes     SOCIAL HISTORY   Social History   Tobacco Use  . Smoking status: Current Every Day Smoker    Packs/day: 0.50    Years: 30.00    Pack years: 15.00  . Smokeless tobacco: Never Used  Substance Use Topics  . Alcohol use: No  . Drug use: No     MEDICATIONS    Home Medication:    Current Medication:  Current Facility-Administered Medications:  .  0.9 %  sodium chloride infusion, , Intravenous, PRN, Drema Dallas, MD, Last Rate: 10 mL/hr at 06/10/19 2208, 1,000 mL at 06/10/19 2208 .  albuterol (PROVENTIL) (2.5 MG/3ML) 0.083% nebulizer solution 2.5 mg, 2.5 mg, Nebulization, Q2H PRN, Andris Baumann, MD .  arformoterol [facility]) nebulizer solution 15 mcg, 15 mcg, Nebulization, BID, Marguerita Merles [place], [place], 15 mcg at 06/12/19 0846 .  aspirin EC tablet 81 mg, 81 mg, Oral, Daily, Drema Dallas, MD, 81 mg at 06/12/19 0939 .  atorvastatin (LIPITOR) tablet 80 mg, 80 mg, Oral, QHS, Drema Dallas, MD, 80 mg at 06/11/19 2151 .  azithromycin (ZITHROMAX) 500 mg in sodium chloride 0.9 % 250 mL IVPB, 500 mg, Intravenous, Q24H, Drema Dallas, MD, Last Rate: 250 mL/hr at 06/11/19 1825, 500 mg at 06/11/19 1825 .  budesonide (PULMICORT) nebulizer solution 0.25  mg, 0.25 mg, Nebulization, BID, Marguerita Merles [place], DO, 0.25 mg at 06/12/19 0846 .  busPIRone (BUSPAR) tablet 10 mg, 10 mg, Oral, BID, Andris Baumann, MD, 10 mg at 06/12/19 0940 .  cefTRIAXone (ROCEPHIN) 2 g in sodium chloride 0.9 % 100 mL IVPB, 2 g, Intravenous, Q24H, Drema Dallas, MD, Last Rate: 200 mL/hr at 06/11/19 1700, 2 g at 06/11/19 1700 .  Chlorhexidine Gluconate Cloth 2 % PADS 6 each, 6 each, Topical, Q0600, Drema Dallas, MD, 6 each at 06/12/19 0543 .  cholecalciferol (VITAMIN D3) tablet 2,000 Units, 2,000 Units, Oral, Daily, Marguerita Merles [place], [place], 2,000 Units at 06/12/19 0940 .  DULoxetine (CYMBALTA) DR capsule 60 mg, 60 mg, Oral, Daily, Drema Dallas, MD, 60 mg at 06/12/19 0945 .  furosemide  (LASIX) tablet 20 mg, 20 mg, Oral, Daily PRN, Sheikh, Omair Latif, DO .  guaiFENesin [facility]) 12 hr tablet 1,200 mg, 1,200 mg, Oral, BID, Marguerita Merles Latif, DO, 1,200 mg at 06/12/19 0941 .  insulin aspart (novoLOG) injection 0-15 Units, 0-15 Units, Subcutaneous, TID WC, Sheikh, Omair Latif, DO .  insulin aspart (novoLOG) injection 0-5 Units, 0-5 Units, Subcutaneous, QHS, Sheikh, Omair Latif, DO .  insulin aspart (novoLOG) injection 5 Units, 5 Units, Subcutaneous, TID WC, Sheikh, Omair Latif, DO .  insulin glargine (LANTUS) injection 26 Units, 26 Units, Subcutaneous, QHS, Drema Dallas, MD, 26 Units at 06/11/19 2148 .  ipratropium-albuterol (DUONEB) 0.5-2.5 (3) MG/3ML nebulizer solution 3 mL, 3 mL, Nebulization, Q6H, Lindajo Royal V, MD, 3 mL at 06/12/19 1410 .  lamoTRIgine (LAMICTAL) tablet 100 mg, 100 mg, Oral, Daily, Drema Dallas, MD, 100 mg at 06/12/19 0940 .  methylPREDNISolone sodium succinate (SOLU-MEDROL) 125 mg/2 mL injection 60 mg, 60 mg, Intravenous, Q8H, Sheikh, Omair Latif, DO, 60 mg at 06/12/19 1308 .  metoprolol succinate (TOPROL-XL) 24 hr tablet 25 mg, 25 mg, Oral, Daily, Lindajo Royal V, MD, 25 mg at 06/12/19 0941 .  mupirocin ointment (BACTROBAN) 2 % 1 application, 1 application, Nasal, BID, Drema Dallas, MD, 1 application at 06/12/19 [phone] .  nicotine (NICODERM CQ - dosed in mg/24 hours) patch 21 mg, 21 mg, Transdermal, Daily, Drema Dallas, MD, 21 mg at 06/11/19 2156 .  nitroGLYCERIN (NITROSTAT) SL tablet 0.4 mg, 0.4 mg, Sublingual, Q5 min PRN, Andris Baumann, MD .  ondansetron [facility]) injection 4 mg, 4 mg, Intravenous, Q6H PRN, Drema Dallas, MD, 4 mg at 06/11/19 0856 .  ondansetron (ZOFRAN) tablet 4 mg, 4 mg, Oral, Daily PRN, Marland Mcalpine, Omair Latif, DO .  oxyCODONE (Oxy IR/ROXICODONE) immediate release tablet 10 mg, 10 mg, Oral, Q4H PRN, Marguerita Merles Latif, DO, 10 mg at 06/12/19 1315 .  oxymetazoline (AFRIN) 0.05 % nasal spray 2 spray, 2 spray, Each Nare, BID PRN,  Sheikh, Omair Latif, DO .  pantoprazole (PROTONIX) EC tablet 40 mg, 40 mg, Oral, Daily, Marguerita Merles Latif, DO, 40 mg at 06/12/19 0940 .  pregabalin (LYRICA) capsule 200 mg, 200 mg, Oral, TID, Drema Dallas, MD, 200 mg at 06/12/19 0939 .  protein supplement (ENSURE MAX) liquid, 11 oz, Oral, BID BM, Marguerita Merles [place], DO, 11 oz at 06/12/19 0949 .  rivaroxaban (XARELTO) tablet 20 mg, 20 mg, Oral, Q supper, Drema Dallas, MD, 20 mg at 06/11/19 1656 .  traZODone (DESYREL) tablet 100 mg, 100 mg, Oral, QHS PRN, Sheikh, Omair Latif, DO    ALLERGIES   Acetaminophen and Nsaids     REVIEW OF SYSTEMS  Review of Systems:  Gen:  Denies  fever, sweats, chills weigh loss  HEENT: Denies blurred vision, double vision, ear pain, eye pain, hearing loss, nose bleeds, sore throat Cardiac:  No dizziness, chest pain or heaviness, chest tightness,edema Resp: Reports shortness of breath,wheezing, denies hemoptysis,  Gi: Denies swallowing difficulty, stomach pain, nausea or vomiting, diarrhea, constipation, bowel incontinence Gu:  Denies bladder incontinence, burning urine Ext:   Denies Joint pain, stiffness or swelling Skin: Denies  skin rash, easy bruising or bleeding or hives Endoc:  Denies polyuria, polydipsia , polyphagia or weight change Psych:   Denies depression, insomnia or hallucinations   Other:  All other systems negative   VS: BP (!) 149/85 (BP Location: Right Arm)   Pulse 76   Temp 98.6 F (37 C) (Oral)   Resp 15   Ht 6' (1.829 m)   Wt 92.9 kg   SpO2 92%   BMI 27.78 kg/m      PHYSICAL EXAM    GENERAL:NAD, no fevers, chills, no weakness no fatigue HEAD: Normocephalic, atraumatic.  EYES: Pupils equal, round, reactive to light. Extraocular muscles intact. No scleral icterus.  MOUTH: Moist mucosal membrane. Dentition intact. No abscess noted.  EAR, NOSE, THROAT: Clear without exudates. No external lesions.  NECK: Supple. No thyromegaly. No nodules. No JVD.   PULMONARY: Mildly rhonchorous breath sounds bilaterally CARDIOVASCULAR: S1 and S2. Regular rate and rhythm. No murmurs, rubs, or gallops. No edema. Pedal pulses 2+ bilaterally.  GASTROINTESTINAL: Soft, nontender, nondistended. No masses. Positive bowel sounds. No hepatosplenomegaly.  MUSCULOSKELETAL: No swelling, clubbing, or edema. Range of motion full in all extremities.  NEUROLOGIC: Cranial nerves II through XII are intact. No gross focal neurological deficits. Sensation intact. Reflexes intact.  SKIN: No ulceration, lesions, rashes, or cyanosis. Skin warm and dry. Turgor intact.  PSYCHIATRIC: Mood, affect within normal limits. The patient is awake, alert and oriented x 3. Insight, judgment intact.       IMAGING    DG Chest 2 View  Result Date: 05/25/2019 CLINICAL DATA:  Wheezing. Status post fall EXAM: CHEST - 2 VIEW COMPARISON:  10/07/2015 FINDINGS: Signs of stenting along the LEFT superior mediastinal border, evidence of cervical spinal fusion and with nerve stimulator in place projecting over midthoracic spine. Cardiomediastinal contours are normal. Hilar structures with stable appearance. Hyperinflation without consolidation or sign of pleural effusion. Visualized skeletal structures are unremarkable. IMPRESSION: No active cardiopulmonary disease. Signs of COPD. Electronically Signed   By: Donzetta Kohut M.D.   On: 05/25/2019 17:06   CT ANGIO CHEST PE W OR WO CONTRAST  Result Date: 06/12/2019 CLINICAL DATA:  COPD exacerbation, wheezing EXAM: CT ANGIOGRAPHY CHEST WITH CONTRAST TECHNIQUE: Multidetector CT imaging of the chest was performed using the standard protocol during bolus administration of intravenous contrast. Multiplanar CT image reconstructions and MIPs were obtained to evaluate the vascular anatomy. CONTRAST:  38mL OMNIPAQUE IOHEXOL 350 MG/ML SOLN COMPARISON:  03/30/2013 FINDINGS: Cardiovascular: Satisfactory opacification of the pulmonary arteries to the segmental level. No  evidence of pulmonary embolism. Normal heart size. Three-vessel coronary artery calcifications. No pericardial effusion. Aortic atherosclerosis. Left subclavian artery stent. Mediastinum/Nodes: No enlarged mediastinal, hilar, or axillary lymph nodes. Thyroid gland, trachea, and esophagus demonstrate no significant findings. Lungs/Pleura: Moderate predominantly centrilobular emphysema. Diffuse bilateral bronchial wall thickening. Generally bandlike dependent bibasilar atelectasis or consolidations. Upper Abdomen: No acute abnormality. Musculoskeletal: No chest wall abnormality. No acute or significant osseous findings. Review of the MIP images confirms the above findings. IMPRESSION: 1. Negative examination for pulmonary embolism.  2. Generally bandlike dependent bibasilar atelectasis or consolidations, which may reflect bland atelectasis or alternately infection or aspiration. 3. Diffuse bilateral bronchial wall thickening, consistent with nonspecific infectious or inflammatory bronchitis. 4. Emphysema (ICD10-J43.9). 5. Coronary artery disease. Aortic Atherosclerosis (ICD10-I70.0). Electronically Signed   By: Eddie Candle M.D.   On: 06/12/2019 11:19   CT Lumbar Spine Wo Contrast  Result Date: 05/25/2019 CLINICAL DATA:  Fall. Low back pain. EXAM: CT LUMBAR SPINE WITHOUT CONTRAST TECHNIQUE: Multidetector CT imaging of the lumbar spine was performed without intravenous contrast administration. Multiplanar CT image reconstructions were also generated. COMPARISON:  04/07/2019 FINDINGS: Segmentation: 5 lumbar type vertebrae. Alignment: Unchanged grade 1 anterolisthesis of L4 on L5. Vertebrae: The L1 superior endplate compression fracture on the prior CT demonstrates progressive, mild vertebral body height loss and sclerosis along the fracture without retropulsion or posterior element involvement. No acute lumbar spine fracture is identified. Nondisplaced bilateral sacral fractures also demonstrate new sclerosis/healing  and are again noted to extend transversely across the anterior S2 segment. Sequelae of L4-5 posterior and interbody fusion are again identified with unchanged hardware positioning and no evidence of screw loosening. Paraspinal and other soft tissues: Abdominal aortic atherosclerosis without aneurysm. Postsurgical changes in the posterior lumbar soft tissues. Partially visualized thoracic spinal cord stimulator. Disc levels: T12-L1 through L2-3: No significant findings. L3-4: Mild disc space narrowing. Right eccentric disc bulging and facet and ligamentum flavum hypertrophy result in mild-to-moderate spinal stenosis and mild-to-moderate right neural foraminal stenosis, unchanged. L4-5: Previous wide posterior decompression and fusion. Limited assessment of the spinal canal due to streak artifact. No significant stenosis identified. L5-S1: Moderate right greater than left facet arthrosis without stenosis, unchanged. IMPRESSION: 1. Subacute L1 compression fracture with mildly progressive height loss. 2. Healing bilateral sacral fractures. 3. No new fracture identified. 4. Unchanged disc and facet degeneration at L3-4 with mild-to-moderate spinal stenosis. 5. Aortic Atherosclerosis (ICD10-I70.0). Electronically Signed   By: Logan Bores M.D.   On: 05/25/2019 17:25   DG Chest Port 1 View  Result Date: 06/12/2019 CLINICAL DATA:  Dyspnea EXAM: PORTABLE CHEST 1 VIEW COMPARISON:  06/09/2019 FINDINGS: Cardiac shadows within normal limits. Aortic calcifications are noted. Stimulator is noted over the midthoracic spine. Stable bibasilar scarring is noted. No new focal infiltrate is seen. Postsurgical changes in the cervical spine are noted. IMPRESSION: Stable bibasilar scarring.  No other focal abnormality is noted. Electronically Signed   By: Inez Catalina M.D.   On: 06/12/2019 01:26   DG Chest Port 1 View  Result Date: 06/09/2019 CLINICAL DATA:  Dyspnea EXAM: PORTABLE CHEST 1 VIEW COMPARISON:  05/25/2019 FINDINGS:  Cardiac shadow is within normal limits. Aortic calcifications are noted. Spinal stimulator and left subclavian arterial stent are noted. Lungs are hyperinflated consistent with COPD. Bibasilar scarring is noted. No acute infiltrate is noted. IMPRESSION: COPD and bibasilar scarring.  No other focal abnormality is noted. Electronically Signed   By: Inez Catalina M.D.   On: 06/09/2019 21:37      ASSESSMENT/PLAN   Acute on chronic hypoxemic respiratory failure -Due to moderate acute exacerbation of COPD -Complicated by bibasilar atelectasis -Agree with zithromax and rocephin for empiric CAP coverage -will decrease Solu-Medrol from 60-40 3 times daily -Agree with Pulmicort and Brovana -We will add chest physiotherapy with MetaNeb utilizing saline every 4 hours -Mucomyst 4 mL 20% twice daily -PT OT when able -I-S at bedside encouraged to use multiple times each hour -Acapella device use as able several times daily -Roflumilast 250 MCG p.o. daily  Thank you for allowing me to participate in the care of this patient.   Patient/Family are satisfied with care plan and all questions have been answered.   This document was prepared using Dragon voice recognition software and may include unintentional dictation errors.     Ottie Glazier, M.D.  Division of [facility]

## 2019-06-12 NOTE — Progress Notes (Signed)
PT Cancellation Note  Patient Details Name: Gregory Hall MRN: [id number] DOB: December 16, 1955   Cancelled Treatment:    Reason Eval/Treat Not Completed: Patient declined, no reason specified. Reports he does not feel well, has not slept in two days. Declines all PT. Will re-attempt later today if time allows.    [name] 06/12/2019, 10:58 AM

## 2019-06-13 ENCOUNTER — Inpatient Hospital Stay: Payer: Medicare Other

## 2019-06-13 LAB — COMPREHENSIVE METABOLIC PANEL
ALT: 15 U/L (ref 0–44)
AST: 15 U/L (ref 15–41)
Albumin: 2.8 g/dL — ABNORMAL LOW (ref 3.5–5.0)
Alkaline Phosphatase: 66 U/L (ref 38–126)
Anion gap: 8 (ref 5–15)
BUN: 26 mg/dL — ABNORMAL HIGH (ref 8–23)
CO2: 34 mmol/L — ABNORMAL HIGH (ref 22–32)
Calcium: 8.5 mg/dL — ABNORMAL LOW (ref 8.9–10.3)
Chloride: 95 mmol/L — ABNORMAL LOW (ref 98–111)
Creatinine, Ser: 1 mg/dL (ref 0.61–1.24)
GFR calc Af Amer: >60 mL/min (ref >60)
GFR calc non Af Amer: >60 mL/min (ref >60)
Glucose, Bld: 255 mg/dL — ABNORMAL HIGH (ref 70–99)
Potassium: 4.8 mmol/L (ref 3.5–5.1)
Sodium: 137 mmol/L (ref 135–145)
Total Bilirubin: 0.4 mg/dL (ref 0.3–1.2)
Total Protein: 5.4 g/dL — ABNORMAL LOW (ref 6.5–8.1)

## 2019-06-13 LAB — CULTURE, RESPIRATORY W GRAM STAIN

## 2019-06-13 LAB — MAGNESIUM: Magnesium: 2.1 mg/dL (ref 1.7–2.4)

## 2019-06-13 LAB — CBC WITH DIFFERENTIAL/PLATELET
Abs Immature Granulocytes: 0.07 10*3/uL (ref 0.00–0.07)
Basophils Absolute: 0 10*3/uL (ref 0.0–0.1)
Basophils Relative: 0 %
Eosinophils Absolute: 0 10*3/uL (ref 0.0–0.5)
Eosinophils Relative: 0 %
HCT: 42.1 % (ref 39.0–52.0)
Hemoglobin: 13.8 g/dL (ref 13.0–17.0)
Immature Granulocytes: 1 %
Lymphocytes Relative: 7 %
Lymphs Abs: 0.8 10*3/uL (ref 0.7–4.0)
MCH: 29.1 pg (ref 26.0–34.0)
MCHC: 32.8 g/dL (ref 30.0–36.0)
MCV: 88.8 fL (ref 80.0–100.0)
Monocytes Absolute: 0.9 10*3/uL (ref 0.1–1.0)
Monocytes Relative: 8 %
Neutro Abs: 9.9 10*3/uL — ABNORMAL HIGH (ref 1.7–7.7)
Neutrophils Relative %: 84 %
Platelets: 208 10*3/uL (ref 150–400)
RBC: 4.74 MIL/uL (ref 4.22–5.81)
RDW: 15.6 % — ABNORMAL HIGH (ref 11.5–15.5)
WBC: 11.7 10*3/uL — ABNORMAL HIGH (ref 4.0–10.5)
nRBC: 0 % (ref 0.0–0.2)

## 2019-06-13 LAB — GLUCOSE, CAPILLARY
Glucose-Capillary: 218 mg/dL — ABNORMAL HIGH (ref 70–99)
Glucose-Capillary: 267 mg/dL — ABNORMAL HIGH (ref 70–99)
Glucose-Capillary: 223 mg/dL — ABNORMAL HIGH (ref 70–99)
Glucose-Capillary: 264 mg/dL — ABNORMAL HIGH (ref 70–99)

## 2019-06-13 LAB — PHOSPHORUS: Phosphorus: 4.4 mg/dL (ref 2.5–4.6)

## 2019-06-13 MED ORDER — SULFAMETHOXAZOLE-TRIMETHOPRIM 400-80 MG/5ML IV SOLN
15.0000 mg/kg/d | Freq: Three times a day (TID) | INTRAVENOUS | Status: DC
  Administered 2019-06-13 – 2019-06-14 (×3): 464.48 mg via INTRAVENOUS
  Filled 2019-06-13 (×7): qty 29.03

## 2019-06-13 MED ORDER — INSULIN ASPART 100 UNIT/ML ~~LOC~~ SOLN
7.0000 [IU] | Freq: Three times a day (TID) | SUBCUTANEOUS | Status: DC
  Administered 2019-06-13 – 2019-06-14 (×3): 7 [IU] via SUBCUTANEOUS
  Filled 2019-06-13 (×3): qty 1

## 2019-06-13 MED ORDER — METHYLPREDNISOLONE SODIUM SUCC 40 MG IJ SOLR
40.0000 mg | Freq: Two times a day (BID) | INTRAMUSCULAR | Status: DC
  Administered 2019-06-14 (×2): 40 mg via INTRAVENOUS
  Filled 2019-06-13 (×2): qty 1

## 2019-06-13 MED ORDER — ACETYLCYSTEINE 20 % IN SOLN
4.0000 mL | Freq: Two times a day (BID) | RESPIRATORY_TRACT | Status: DC
  Administered 2019-06-13 – 2019-06-14 (×2): 4 mL via RESPIRATORY_TRACT
  Filled 2019-06-13 (×4): qty 4

## 2019-06-13 MED ORDER — AZITHROMYCIN 500 MG PO TABS
500.0000 mg | ORAL_TABLET | Freq: Every day | ORAL | Status: DC
  Filled 2019-06-13: qty 1

## 2019-06-13 NOTE — Progress Notes (Signed)
PHARMACIST - PHYSICIAN COMMUNICATION DR:   Marland Mcalpine CONCERNING: Antibiotic IV to Oral Route Change Policy  RECOMMENDATION: This patient is receiving Azithromycin by the intravenous route.  Based on criteria approved by the Pharmacy and Therapeutics Committee, the antibiotic(s) is/are being converted to the equivalent oral dose form(s).   DESCRIPTION: These criteria include:  Patient being treated for a respiratory tract infection, urinary tract infection, cellulitis or clostridium difficile associated diarrhea if on metronidazole  The patient is not neutropenic and does not exhibit a GI malabsorption state  The patient is eating (either orally or via tube) and/or has been taking other orally administered medications for a least 24 hours  The patient is improving clinically and has a Tmax < 100.5  If you have questions about this conversion, please contact the Pharmacy Department   Albina Billet, PharmD, BCPS Clinical Pharmacist 06/13/2019 10:05 AM

## 2019-06-13 NOTE — Progress Notes (Signed)
Pulmonary Medicine          Date: 06/13/2019,   MRN# [id number] Gregory Hall [facility] December 07, 1955     AdmissionWeight: 96.2 kg                 CurrentWeight: 92.9 kg  Referring physician: Dr Alfredia Ferguson    CHIEF COMPLAINT:   Acute exacerbation of COPD   HISTORY OF PRESENT ILLNESS   Patient reports feeling better. He has CXR today with persistent platelike atelectasis.  This is complicated by lumbar pain with deep breathing due to spinal stimulator. Discussed with patient today. Reviewed Care plan for bronchopulmonary regimen with RT today .    PAST MEDICAL HISTORY   Past Medical History:  Diagnosis Date  . CAD (coronary artery disease)    s/p PTCA and stent x2  . Chest pain   . Chronic pain syndrome   . Degenerative cervical disc   . Dyslipidemia   . GERD (gastroesophageal reflux disease)   . Hernia 2014  . Hypertension   . MRSA (methicillin resistant staph aureus) culture positive 2011  . Neuropathy   . Nutcracker esophagus   . Rectus diastasis 07/19/2012     SURGICAL HISTORY   Past Surgical History:  Procedure Laterality Date  . BACK SURGERY  2012  . CHOLECYSTECTOMY    . COLONOSCOPY  Jan 2014   [name]  . CORONARY ANGIOPLASTY WITH STENT PLACEMENT  2009   stents x2, in [place], [place]  . FOOT SURGERY     Right  . NECK SURGERY    . SPINE SURGERY  2012,2013  . TONSILLECTOMY       FAMILY HISTORY   Family History  Family history unknown: Yes     SOCIAL HISTORY   Social History   Tobacco Use  . Smoking status: Current Every Day Smoker    Packs/day: 0.50    Years: 30.00    Pack years: 15.00  . Smokeless tobacco: Never Used  Substance Use Topics  . Alcohol use: No  . Drug use: No     MEDICATIONS    Home Medication:    Current Medication:  Current Facility-Administered Medications:  .  0.9 %  sodium chloride infusion, , Intravenous, PRN, Allie Bossier, MD, Last Rate: 10 mL/hr at 06/10/19 2208, 1,000 mL at 06/10/19 2208 .   albuterol (PROVENTIL) (2.5 MG/3ML) 0.083% nebulizer solution 2.5 mg, 2.5 mg, Nebulization, Q2H PRN, Athena Masse, MD .  arformoterol [facility]) nebulizer solution 15 mcg, 15 mcg, Nebulization, BID, Raiford Noble [place], [place], 15 mcg at 06/13/19 0719 .  aspirin EC tablet 81 mg, 81 mg, Oral, Daily, Allie Bossier, MD, 81 mg at 06/13/19 0816 .  atorvastatin (LIPITOR) tablet 80 mg, 80 mg, Oral, QHS, Allie Bossier, MD, 80 mg at 06/12/19 2141 .  azithromycin (ZITHROMAX) tablet 500 mg, 500 mg, Oral, Daily, Shanlever, Charles M, RPH .  budesonide (PULMICORT) nebulizer solution 0.25 mg, 0.25 mg, Nebulization, BID, Sheikh, Omair Latif, DO, 0.25 mg at 06/13/19 0719 .  busPIRone (BUSPAR) tablet 10 mg, 10 mg, Oral, BID, Athena Masse, MD, 10 mg at 06/13/19 0818 .  cefTRIAXone (ROCEPHIN) 2 g in sodium chloride 0.9 % 100 mL IVPB, 2 g, Intravenous, Q24H, Allie Bossier, MD, Last Rate: 200 mL/hr at 06/12/19 1704, 2 g at 06/12/19 1704 .  Chlorhexidine Gluconate Cloth 2 % PADS 6 each, 6 each, Topical, Q0600, Allie Bossier, MD, 6 each at 06/13/19 [phone]  cholecalciferol (VITAMIN D3) tablet  2,000 Units, 2,000 Units, Oral, Daily, Marguerita Merles [place], [place], 2,000 Units at 06/13/19 0815 .  DULoxetine (CYMBALTA) DR capsule 60 mg, 60 mg, Oral, Daily, Drema Dallas, MD, 60 mg at 06/13/19 0818 .  furosemide (LASIX) tablet 20 mg, 20 mg, Oral, Daily PRN, Sheikh, Omair Latif, DO .  guaiFENesin [facility]) 12 hr tablet 1,200 mg, 1,200 mg, Oral, BID, Marguerita Merles [place], DO, 1,200 mg at 06/13/19 0816 .  insulin aspart (novoLOG) injection 0-15 Units, 0-15 Units, Subcutaneous, TID WC, Marguerita Merles [place], [place], 8 Units at 06/13/19 1232 .  insulin aspart (novoLOG) injection 0-5 Units, 0-5 Units, Subcutaneous, QHS, Marguerita Merles [place], [place], 2 Units at 06/12/19 2202 .  insulin aspart (novoLOG) injection 7 Units, 7 Units, Subcutaneous, TID WC, Sheikh, Omair Latif, DO .  insulin glargine (LANTUS) injection 26 Units, 26 Units,  Subcutaneous, QHS, Drema Dallas, MD, 26 Units at 06/12/19 2200 .  ipratropium-albuterol (DUONEB) 0.5-2.5 (3) MG/3ML nebulizer solution 3 mL, 3 mL, Nebulization, Q6H, Andris Baumann, MD, 3 mL at 06/13/19 0720 .  lamoTRIgine (LAMICTAL) tablet 100 mg, 100 mg, Oral, Daily, Drema Dallas, MD, 100 mg at 06/13/19 0816 .  [START ON 06/14/2019] methylPREDNISolone sodium succinate (SOLU-MEDROL) 40 mg/mL injection 40 mg, 40 mg, Intravenous, Q12H, Sheikh, Omair Latif, DO .  metoprolol succinate (TOPROL-XL) 24 hr tablet 25 mg, 25 mg, Oral, Daily, Lindajo Royal V, MD, 25 mg at 06/13/19 0818 .  mupirocin ointment (BACTROBAN) 2 % 1 application, 1 application, Nasal, BID, Drema Dallas, MD, 1 application at 06/13/19 0820 .  nicotine (NICODERM CQ - dosed in mg/24 hours) patch 21 mg, 21 mg, Transdermal, Daily, Drema Dallas, MD, 21 mg at 06/12/19 2053 .  nitroGLYCERIN (NITROSTAT) SL tablet 0.4 mg, 0.4 mg, Sublingual, Q5 min PRN, Lindajo Royal V, MD .  ondansetron [facility]) injection 4 mg, 4 mg, Intravenous, Q6H PRN, Drema Dallas, MD, 4 mg at 06/11/19 0856 .  ondansetron (ZOFRAN) tablet 4 mg, 4 mg, Oral, Daily PRN, Marland Mcalpine, Omair Latif, DO .  oxyCODONE (Oxy IR/ROXICODONE) immediate release tablet 10 mg, 10 mg, Oral, Q4H PRN, Marguerita Merles Latif, DO, 10 mg at 06/13/19 1230 .  oxymetazoline (AFRIN) 0.05 % nasal spray 2 spray, 2 spray, Each Nare, BID PRN, Sheikh, Omair Latif, DO .  pantoprazole (PROTONIX) EC tablet 40 mg, 40 mg, Oral, Daily, Marguerita Merles [place], DO, 40 mg at 06/13/19 0817 .  pregabalin (LYRICA) capsule 200 mg, 200 mg, Oral, TID, Drema Dallas, MD, 200 mg at 06/13/19 0817 .  protein supplement (ENSURE MAX) liquid, 11 oz, Oral, BID BM, Marguerita Merles [place], DO, 11 oz at 06/13/19 1233 .  rivaroxaban (XARELTO) tablet 20 mg, 20 mg, Oral, Q supper, Drema Dallas, MD, 20 mg at 06/12/19 1758 .  roflumilast (DALIRESP) tablet 250 mcg, 250 mcg, Oral, Daily, Vida Rigger, MD, 250 mcg at 06/13/19 0819 .   traZODone (DESYREL) tablet 100 mg, 100 mg, Oral, QHS PRN, Marguerita Merles Latif, DO, 100 mg at 06/13/19 0241    ALLERGIES   Acetaminophen and Nsaids     REVIEW OF SYSTEMS    Review of Systems:  Gen:  Denies  fever, sweats, chills weigh loss  HEENT: Denies blurred vision, double vision, ear pain, eye pain, hearing loss, nose bleeds, sore throat Cardiac:  No dizziness, chest pain or heaviness, chest tightness,edema Resp: Reports shortness of breath,wheezing, denies hemoptysis,  Gi: Denies swallowing difficulty, stomach pain, nausea or vomiting, diarrhea, constipation, bowel incontinence Gu:  Denies bladder  incontinence, burning urine Ext:   Denies Joint pain, stiffness or swelling Skin: Denies  skin rash, easy bruising or bleeding or hives Endoc:  Denies polyuria, polydipsia , polyphagia or weight change Psych:   Denies depression, insomnia or hallucinations   Other:  All other systems negative   VS: BP (!) 149/78 (BP Location: Right Arm)   Pulse 67   Temp 98.6 F (37 C)   Resp 16   Ht 6' (1.829 m)   Wt 92.9 kg   SpO2 92%   BMI 27.78 kg/m      PHYSICAL EXAM    GENERAL:NAD, no fevers, chills, no weakness no fatigue HEAD: Normocephalic, atraumatic.  EYES: Pupils equal, round, reactive to light. Extraocular muscles intact. No scleral icterus.  MOUTH: Moist mucosal membrane. Dentition intact. No abscess noted.  EAR, NOSE, THROAT: Clear without exudates. No external lesions.  NECK: Supple. No thyromegaly. No nodules. No JVD.  PULMONARY: Mildly rhonchorous breath sounds bilaterally CARDIOVASCULAR: S1 and S2. Regular rate and rhythm. No murmurs, rubs, or gallops. No edema. Pedal pulses 2+ bilaterally.  GASTROINTESTINAL: Soft, nontender, nondistended. No masses. Positive bowel sounds. No hepatosplenomegaly.  MUSCULOSKELETAL: No swelling, clubbing, or edema. Range of motion full in all extremities.  NEUROLOGIC: Cranial nerves II through XII are intact. No gross focal  neurological deficits. Sensation intact. Reflexes intact.  SKIN: No ulceration, lesions, rashes, or cyanosis. Skin warm and dry. Turgor intact.  PSYCHIATRIC: Mood, affect within normal limits. The patient is awake, alert and oriented x 3. Insight, judgment intact.       IMAGING    DG Chest 1 View  Result Date: 06/13/2019 CLINICAL DATA:  Shortness of breath EXAM: CHEST  1 VIEW COMPARISON:  06/12/2019 FINDINGS: Heart size within normal limits. Atherosclerotic calcification of the aortic knob. Vascular stent again noted at the superior mediastinum. Stable bandlike opacities within the bilateral lung bases. No new focal airspace consolidation. No pleural effusion or pneumothorax. Partially visualized lower cervical ACDF hardware. Spinal stimulator leads within the lower thoracic spine. IMPRESSION: Stable bandlike opacities within the bilateral lung bases. No new focal airspace consolidation. Electronically Signed   By: Duanne Guess D.O.   On: 06/13/2019 08:07   DG Chest 2 View  Result Date: 05/25/2019 CLINICAL DATA:  Wheezing. Status post fall EXAM: CHEST - 2 VIEW COMPARISON:  10/07/2015 FINDINGS: Signs of stenting along the LEFT superior mediastinal border, evidence of cervical spinal fusion and with nerve stimulator in place projecting over midthoracic spine. Cardiomediastinal contours are normal. Hilar structures with stable appearance. Hyperinflation without consolidation or sign of pleural effusion. Visualized skeletal structures are unremarkable. IMPRESSION: No active cardiopulmonary disease. Signs of COPD. Electronically Signed   By: Donzetta Kohut M.D.   On: 05/25/2019 17:06   CT ANGIO CHEST PE W OR WO CONTRAST  Result Date: 06/12/2019 CLINICAL DATA:  COPD exacerbation, wheezing EXAM: CT ANGIOGRAPHY CHEST WITH CONTRAST TECHNIQUE: Multidetector CT imaging of the chest was performed using the standard protocol during bolus administration of intravenous contrast. Multiplanar CT image  reconstructions and MIPs were obtained to evaluate the vascular anatomy. CONTRAST:  63mL OMNIPAQUE IOHEXOL 350 MG/ML SOLN COMPARISON:  03/30/2013 FINDINGS: Cardiovascular: Satisfactory opacification of the pulmonary arteries to the segmental level. No evidence of pulmonary embolism. Normal heart size. Three-vessel coronary artery calcifications. No pericardial effusion. Aortic atherosclerosis. Left subclavian artery stent. Mediastinum/Nodes: No enlarged mediastinal, hilar, or axillary lymph nodes. Thyroid gland, trachea, and esophagus demonstrate no significant findings. Lungs/Pleura: Moderate predominantly centrilobular emphysema. Diffuse bilateral bronchial wall thickening. Generally  bandlike dependent bibasilar atelectasis or consolidations. Upper Abdomen: No acute abnormality. Musculoskeletal: No chest wall abnormality. No acute or significant osseous findings. Review of the MIP images confirms the above findings. IMPRESSION: 1. Negative examination for pulmonary embolism. 2. Generally bandlike dependent bibasilar atelectasis or consolidations, which may reflect bland atelectasis or alternately infection or aspiration. 3. Diffuse bilateral bronchial wall thickening, consistent with nonspecific infectious or inflammatory bronchitis. 4. Emphysema (ICD10-J43.9). 5. Coronary artery disease. Aortic Atherosclerosis (ICD10-I70.0). Electronically Signed   By: Lauralyn Primes M.D.   On: 06/12/2019 11:19   CT Lumbar Spine Wo Contrast  Result Date: 05/25/2019 CLINICAL DATA:  Fall. Low back pain. EXAM: CT LUMBAR SPINE WITHOUT CONTRAST TECHNIQUE: Multidetector CT imaging of the lumbar spine was performed without intravenous contrast administration. Multiplanar CT image reconstructions were also generated. COMPARISON:  04/07/2019 FINDINGS: Segmentation: 5 lumbar type vertebrae. Alignment: Unchanged grade 1 anterolisthesis of L4 on L5. Vertebrae: The L1 superior endplate compression fracture on the prior CT demonstrates  progressive, mild vertebral body height loss and sclerosis along the fracture without retropulsion or posterior element involvement. No acute lumbar spine fracture is identified. Nondisplaced bilateral sacral fractures also demonstrate new sclerosis/healing and are again noted to extend transversely across the anterior S2 segment. Sequelae of L4-5 posterior and interbody fusion are again identified with unchanged hardware positioning and no evidence of screw loosening. Paraspinal and other soft tissues: Abdominal aortic atherosclerosis without aneurysm. Postsurgical changes in the posterior lumbar soft tissues. Partially visualized thoracic spinal cord stimulator. Disc levels: T12-L1 through L2-3: No significant findings. L3-4: Mild disc space narrowing. Right eccentric disc bulging and facet and ligamentum flavum hypertrophy result in mild-to-moderate spinal stenosis and mild-to-moderate right neural foraminal stenosis, unchanged. L4-5: Previous wide posterior decompression and fusion. Limited assessment of the spinal canal due to streak artifact. No significant stenosis identified. L5-S1: Moderate right greater than left facet arthrosis without stenosis, unchanged. IMPRESSION: 1. Subacute L1 compression fracture with mildly progressive height loss. 2. Healing bilateral sacral fractures. 3. No new fracture identified. 4. Unchanged disc and facet degeneration at L3-4 with mild-to-moderate spinal stenosis. 5. Aortic Atherosclerosis (ICD10-I70.0). Electronically Signed   By: Sebastian Ache M.D.   On: 05/25/2019 17:25   DG Chest Port 1 View  Result Date: 06/12/2019 CLINICAL DATA:  Dyspnea EXAM: PORTABLE CHEST 1 VIEW COMPARISON:  06/09/2019 FINDINGS: Cardiac shadows within normal limits. Aortic calcifications are noted. Stimulator is noted over the midthoracic spine. Stable bibasilar scarring is noted. No new focal infiltrate is seen. Postsurgical changes in the cervical spine are noted. IMPRESSION: Stable bibasilar  scarring.  No other focal abnormality is noted. Electronically Signed   By: Alcide Clever M.D.   On: 06/12/2019 01:26   DG Chest Port 1 View  Result Date: 06/09/2019 CLINICAL DATA:  Dyspnea EXAM: PORTABLE CHEST 1 VIEW COMPARISON:  05/25/2019 FINDINGS: Cardiac shadow is within normal limits. Aortic calcifications are noted. Spinal stimulator and left subclavian arterial stent are noted. Lungs are hyperinflated consistent with COPD. Bibasilar scarring is noted. No acute infiltrate is noted. IMPRESSION: COPD and bibasilar scarring.  No other focal abnormality is noted. Electronically Signed   By: Alcide Clever M.D.   On: 06/09/2019 21:37     ASSESSMENT/PLAN   Acute on chronic hypoxemic respiratory failure -Due to moderate acute exacerbation of COPD -Complicated by bibasilar atelectasis -Agree with zithromax and rocephin for empiric CAP coverage -will decrease Solu-Medrol from 60-40 3 times daily -Agree with Pulmicort and Brovana -We will add chest physiotherapy with MetaNeb utilizing saline every  4 hours -Mucomyst 4 mL 20% twice daily -PT OT when able -I-S at bedside encouraged to use multiple times each hour -Acapella device use as able several times daily -Roflumilast 250 MCG p.o. daily   Thank you for allowing me to participate in the care of this patient.   Patient/Family are satisfied with care plan and all questions have been answered.   This document was prepared using Dragon voice recognition software and may include unintentional dictation errors.     Vida Rigger, M.D.  Division of Pulmonary & Critical Care Medicine  [facility]

## 2019-06-13 NOTE — Progress Notes (Signed)
PT Cancellation Note  Patient Details Name: Gregory Hall MRN: [id number] DOB: September 21, 1955   Cancelled Treatment:     Pt was supine in bed with SOB noted. HR 89 bpm and Sao2 88% with O2 reapplied now that he is back in bed. He states he just ambulated from BR to bed without assistance using RW without O2 donned. He refused working with therapist 2/2 to fatigue." MD told me not to over do it." Acute PT will continue to follow per POC progressing as able.    Rushie Chestnut 06/13/2019, 3:52 PM

## 2019-06-13 NOTE — Plan of Care (Signed)
  Problem: Health Behavior/Discharge Planning: Goal: Ability to manage health-related needs will improve Outcome: Progressing   Problem: Clinical Measurements: Goal: Ability to maintain clinical measurements within normal limits will improve Outcome: Progressing Goal: Will remain free from infection Outcome: Progressing Goal: Diagnostic test results will improve Outcome: Progressing Goal: Cardiovascular complication will be avoided Outcome: Progressing   Problem: Activity: Goal: Risk for activity intolerance will decrease Outcome: Progressing   

## 2019-06-13 NOTE — Progress Notes (Signed)
Inpatient Diabetes Program Recommendations  AACE/ADA: New Consensus Statement on Inpatient Glycemic Control (2015)  Target Ranges:  Prepandial:   less than 140 mg/dL      Peak postprandial:   less than 180 mg/dL (1-2 hours)      Critically ill patients:  140 - 180 mg/dL   Lab Results  Component Value Date   GLUCAP 267 (H) 06/13/2019   HGBA1C 7.9 (H) 06/10/2019    Review of Glycemic Control  Diabetes history:DM2 Outpatient Diabetes medications:Lantus 26 units + Humalog 4 units tid meal coverage + Metformin 500 mg qd Current orders for Inpatient glycemic control:Lantus 26 units + Novolog 5 units tid + Novolog sensitive correction tid + hs 0-5 units  Inpatient Diabetes Program Recommendations:   -Increase Novolog meal coverage to 7 units tid  Thank you, Gregory Hall. Gregory Eagleton, RN, MSN, CDE  Diabetes Coordinator Inpatient Glycemic Control Team Team Pager [phone] (8am-5pm) 06/13/2019 12:25 PM

## 2019-06-13 NOTE — Care Management Important Message (Signed)
Important Message  Patient Details  Name: Gregory Hall MRN: [id number] Date of Birth: Oct 02, 1955   Medicare Important Message Given:  Yes     [name] 06/13/2019, 10:53 AM

## 2019-06-13 NOTE — Progress Notes (Signed)
PROGRESS NOTE    Gregory Hall  ZOX:[id number] DOB: 1955-05-29 DOA: 06/09/2019 PCP: Remi Haggard, FNP   Brief Narrative:  HPI per Dr. Judd Gaudier on 06/09/19 Gregory Hall is a 64 y.o. male with medical history significant for COPD on home O2 at 2 L, CAD with history of PCI of the LAD in 2010, last stress test 2017 low risk, chronic pain as well as history of anxiety on anxiolytics, who presents to the emergency room with a 1-day history of wheezing, not improving with home bronchodilator therapy.  He has associated chest congestion and initially on retrosternal chest tightness of moderate intensity, nonradiating.  Has no associated nausea vomiting diaphoresis, lightheadedness or palpitations.  Denies cough fever or chills.  ED Course: On arrival, patient was afebrile, tachycardic to 24 with O2 sat 88% on home flow rate of 2 L improving to the mid 90s on 4 L.  Vitals were otherwise unremarkable.  Troponin was 5 with a BNP of 51.  Venous blood gas showed PCO2 of 68.Dema Severin cell count normal at 8600.  Noted to have an elevated blood sugar of 272 with no known history of diabetes.  Chest x-ray showed no acute disease.  Patient was treated with several rounds of DuoNeb but continued to have increased work of breathing.  Hospitalist consulted for admission.  **Interim History Continues to be dyspneic and when to use supplemental oxygen via nasal cannula.  Has minimal improvement.  We adjusted his medications further and since he is not improving significantly will obtain a pulmonary consult and a CT of the chest to rule out PE.  Assessment & Plan:   Principal Problem:   Acute on chronic respiratory failure with hypoxia and hypercapnia (HCC) Active Problems:   Chest pain   COPD with acute exacerbation (HCC)   CAD (coronary artery disease)   HTN (hypertension)   Anxiety   Chronic prescription opiate use   Hyperglycemia   Diabetes mellitus type 2, uncontrolled, with  complications (HCC)   Chronic pain syndrome   COPD exacerbation (HCC)  Acute on chronic respiratory failure with hypoxia and hypercapnia in the setting of Acute COPD exacerbation -5-day course antibiotics with Ceftriaxone and Azithromycin will be changed to Bactrim due to his Sputum Cx findings  -Solu-Medrol 60 mg BID increased to TID given persistent wheezing the day before yesterday but has now been reduced to 40 mg TID by Pulmonary and will reduce to 40 mg q12h -DuoNeb scheduled q6h and C/w Albuterol 2.5 mg Neb q2hprn Wheezing and SOB -Added Budesonide 0.25 mg BID and Arformoterol 15 mcg Neb BID  -Takes Trelegy Ellipta 1 puff into the lungs daily at home and can resume when closer to D/C -Flutter valve -Incentive spirometer -Increase Guaifenesin to 1200 mg po BID; States he is expectorating more now -Restarted Home Lasix 20 mg po Daily PRN -6/2 lactic acid/Procalcitonin Negative -Sputum Gram Stain and Cx showed: Gram Stain FEW WBC PRESENT, PREDOMINANTLY PMN  MODERATE GRAM POSITIVE COCCI  FEW GRAM NEGATIVE RODS  Performed at [facility], [address], [place], [place] [address]   Culture FEW STENOTROPHOMONAS MALTOPHILIA   -STENOTROPHOMONAS MALTOPHILIA is sensitive to Levofloxacin and TMP/SMX and will change to Bactrim for coverage  -SpO2: 90 % O2 Flow Rate (L/min): 3 L/min FiO2 (%): 32 %; however wears 3 L at home -C/w Droplet Precautions and will get Respiratory Virus Panel and negative for RSV, Infulenza A/B and SARS-CoV-2 -We will need an ambulatory home O2 screen  prior to discharge -Continuous pulse oximetry and maintain O2 saturations greater than 90% -Since he is not improving will obtain a CTA of the chest to rule out PE and obtain a pulmonary consult -CTA of the Chest showed "Negative examination for pulmonary embolism.  Generally bandlike dependent bibasilar atelectasis or consolidations, which may reflect bland atelectasis or alternately infection or aspiration.  Diffuse bilateral bronchial wall thickening, consistent with nonspecific infectious or inflammatory bronchitis. Emphysema. Coronary artery disease. Aortic Atherosclerosis." -Follow up on Pulmonary Recommendations: Dr. Karna Christmas recommending decreasing the Solu-Medrol from 60 mg to 40 mg 3 times daily (will go to 40 mg q12h today), adding chest physiotherapy with MetaNeb utilizing saline every 4 hours as well as Mucomyst for mL 20% twice daily -Pulmonary also recommending Acapella device is using as able several times daily as well as Roflumilast 250 mcg po Daily  -Repeat CXR this AM showed "Stable bandlike opacities within the bilateral lung bases. No new focal airspace consolidation" -C/w PT/OT Efforts  Chest Pain -Trend troponin most likely secondary to COPD exacerbation but given his cardiac history we will check -Intial Troponin was 13 and repeat was 11 -Hx PCI to LAD 2010 -NTG sublingual PRN, morphine for breakthrough  DM type II uncontrolled with complication -08/24/2011 hemoglobin A1c= 8.3 -Hemoglobin A1c 7.9 this visit -Hold home Metformin 500 g p.o. daily with breakfast -Lipid panel done and unremarkable  -Lantus 26 units daily -NovoLog 4 Units TID will be increased to 5 units TID if patient eats >50% of his meals and will further increase to 7 units per Diabetes Education Coordinator Recc's -Change Moderate SSI q4h to AC/HS -We will consult diabetes education coordinator for further evaluation recommendations given that we are increasing his steroids from 60 mg IV every 12 to every 8 hours -CBGs have been ranging from 135-279 on the last 3 checks and Blood Sugar was 255 this AM on CMP  Anxiety/Depression/Bipolar? -Continue with buspirone 10 mg BID -Continue with duloxetine 60 mg daily -Continue Lamictal 100 mg daily -Continue with Pregabalin 201 p.o. 3 times daily along   Dyslipidemia -Lipid panel was normal and showed total cholesterol/HDL ratio 2.5, cholesterol level of  110, HDL 44, LDL of 53, triglycerides of 65, VLDL 13 -Continue atorvastatin 80 mg p.o. nightly  GERD -Continue PPI with Pantoprazole 40 g daily  Leukocytosis -The setting of steroid demargination -Patient CBC went from 9.3 peaked at 15.6 and is now improing and is 11.7 -PCT has been <0.10 x3 -Continue to monitor for signs and symptoms of infection  -Continue with antibiotics as above  -Repeat CBC in the a.m.  Chronic pain on Chronic Opiates -Continue with as needed oxycodone and I have increased the dose from 10 mg every 6 hours as needed to every 4 hours as needed given that this is his home dose -Lyrica 200 mg TID -Holding his home cyclobenzaprine 5 mg p.o. 3 times daily as needed for muscle spasms for now  Tobacco Abuse -C/w Nicotine patch -Dr. Joseph Art discussed at length about the reason patient need to discontinue smoking to include DEATH  Insomnia -Resume his home Trazodone 100 mg p.o. nightly as needed   DVT prophylaxis: Anticoagulated with Rivaroxaban daily with supper Code Status: FULL CODE Family Communication: No family present at bedside  Disposition Plan: Remain inpatient given his continued dyspnea shortness of breath not back to baseline; Will need Home Health PT/OT at D/C  Status is: Inpatient  Remains inpatient appropriate because:IV treatments appropriate due to intensity of illness or inability to  take PO and Inpatient level of care appropriate due to severity of illness   Dispo: The patient is from: Home              Anticipated d/c is to: Home              Anticipated d/c date is: 1-2 days              Patient currently is not medically stable to d/c.  Consultants:   Pulmonary Dr. Vida Rigger  Procedures: CTA    Antimicrobials:  Anti-infectives (From admission, onward)   Start     Dose/Rate Route Frequency Ordered Stop   06/10/19 1830  cefTRIAXone (ROCEPHIN) 2 g in sodium chloride 0.9 % 100 mL IVPB     2 g 200 mL/hr over 30 Minutes  Intravenous Every 24 hours 06/10/19 1826 06/15/19 1829   06/10/19 1830  azithromycin (ZITHROMAX) 500 mg in sodium chloride 0.9 % 250 mL IVPB     500 mg 250 mL/hr over 60 Minutes Intravenous Every 24 hours 06/10/19 1826 06/15/19 1829     Subjective: Seen and examined at bedside and states that he is feeling a little bit better today than he was yesterday but still not back to his baseline.  Thinks his wheezing has improved somewhat.  States the MetaNeb has been helping.  No chest pain, lightheadedness or dizziness.  No other concerns or complaints at this time.  Objective: Vitals:   06/12/19 1410 06/12/19 2019 06/12/19 2351 06/13/19 0720  BP:   (!) 159/76   Pulse:   69   Resp:   19   Temp:   98.4 F (36.9 C)   TempSrc:      SpO2: 92% 93% 91% 90%  Weight:      Height:        Intake/Output Summary (Last 24 hours) at 06/13/2019 0735 Last data filed at 06/13/2019 0330 Gross per 24 hour  Intake 1286 ml  Output 2350 ml  Net -1064 ml   Filed Weights   06/09/19 2117 06/10/19 0433  Weight: 96.2 kg 92.9 kg   Examination: Physical Exam:  Constitutional: WN/WD overweight Caucasian male currently in no acute distress and is improved from his dyspnea standpoint but still remains a little anxious Eyes: Lids and conjunctivae normal, sclerae anicteric  ENMT: External Ears, Nose appear normal. Grossly normal hearing. Neck: Appears normal, supple, no cervical masses, normal ROM, no appreciable thyromegaly; no JVD Respiratory: Diminished to auscultation bilaterally with coarse breath sounds and he does have some expiratory wheezing but is not as bad as yesterday.  No appreciable rhonchi are noted.  His unlabored breathing but he is wearing supplemental oxygen via nasal cannula.  Cardiovascular: RRR, no murmurs / rubs / gallops. S1 and S2 auscultated.  Minimal extremity edema.  Abdomen: Soft, non-tender, distended secondary body habitus. Bowel sounds positive.  GU: Deferred. Musculoskeletal: No  clubbing / cyanosis of digits/nails. No joint deformity upper and lower extremities.  Skin: No rashes, lesions, ulcers on limited skin evaluation. No induration; Warm and dry.  Neurologic: CN 2-12 grossly intact with no focal deficits. Romberg sign and cerebellar reflexes not assessed.  Psychiatric: Normal judgment and insight. Alert and oriented x 3.  Very slightly anxious mood and appropriate affect.   Data Reviewed: I have personally reviewed following labs and imaging studies  CBC: Recent Labs  Lab 06/09/19 2119 06/10/19 0859 06/11/19 0527 06/12/19 0532 06/13/19 0627  WBC 8.6 9.3 15.6* 14.2* 11.7*  NEUTROABS 4.9 8.8* 13.1*  12.8* 9.9*  HGB 14.0 14.0 13.7 14.0 13.8  HCT 44.1 44.0 43.0 43.1 42.1  MCV 92.5 91.5 91.1 90.0 88.8  PLT 216 220 237 232 208   Basic Metabolic Panel: Recent Labs  Lab 06/09/19 2119 06/10/19 0719 06/11/19 0527 06/12/19 0532 06/13/19 0627  NA 139 136 140 137 137  K 4.9 4.8 4.5 4.9 4.8  CL 98 94* 97* 93* 95*  CO2 35* 33* 34* 34* 34*  GLUCOSE 270* 370* 161* 214* 255*  BUN 10 14 17 22  26*  CREATININE 1.08 1.04 0.92 0.94 1.00  CALCIUM 8.5* 8.6* 9.1 9.1 8.5*  MG  --  1.9 1.9 2.1 2.1  PHOS  --  3.8 4.1 4.2 4.4   GFR: Estimated Creatinine Clearance: 79.8 mL/min (by C-G formula based on SCr of 1 mg/dL). Liver Function Tests: Recent Labs  Lab 06/10/19 0719 06/11/19 0527 06/12/19 0532 06/13/19 0627  AST 17 11* 12* 15  ALT 12 9 11 15   ALKPHOS 80 73 68 66  BILITOT 0.5 0.5 0.5 0.4  PROT 6.0* 5.8* 5.8* 5.4*  ALBUMIN 3.1* 2.9* 3.0* 2.8*   No results for input(s): LIPASE, AMYLASE in the last 168 hours. No results for input(s): AMMONIA in the last 168 hours. Coagulation Profile: No results for input(s): INR, PROTIME in the last 168 hours. Cardiac Enzymes: No results for input(s): CKTOTAL, CKMB, CKMBINDEX, TROPONINI in the last 168 hours. BNP (last 3 results) No results for input(s): PROBNP in the last 8760 hours. HbA1C: No results for input(s):  HGBA1C in the last 72 hours. CBG: Recent Labs  Lab 06/12/19 0808 06/12/19 0924 06/12/19 1135 06/12/19 1626 06/12/19 2132  GLUCAP 216* 320* 279* 135* 222*   Lipid Profile: No results for input(s): CHOL, HDL, LDLCALC, TRIG, CHOLHDL, LDLDIRECT in the last 72 hours. Thyroid Function Tests: No results for input(s): TSH, T4TOTAL, FREET4, T3FREE, THYROIDAB in the last 72 hours. Anemia Panel: No results for input(s): VITAMINB12, FOLATE, FERRITIN, TIBC, IRON, RETICCTPCT in the last 72 hours. Sepsis Labs: Recent Labs  Lab 06/10/19 1848 06/10/19 2029 06/11/19 0527 06/12/19 0532  PROCALCITON <0.10  --  <0.10 <0.10  LATICACIDVEN 1.8 1.5  --   --     Recent Results (from the past 240 hour(s))  SARS Coronavirus 2 by RT PCR (hospital order, performed in [facility] hospital lab) Nasopharyngeal Nasopharyngeal Swab     Status: None   Collection Time: 06/09/19 10:09 PM   Specimen: Nasopharyngeal Swab  Result Value Ref Range Status   SARS Coronavirus 2 NEGATIVE NEGATIVE Final    Comment: (NOTE) SARS-CoV-2 target nucleic acids are NOT DETECTED. The SARS-CoV-2 RNA is generally detectable in upper and lower respiratory specimens during the acute phase of infection. The lowest concentration of SARS-CoV-2 viral copies this assay can detect is 250 copies / mL. A negative result does not preclude SARS-CoV-2 infection and should not be used as the sole basis for treatment or other patient management decisions.  A negative result may occur with improper specimen collection / handling, submission of specimen other than nasopharyngeal swab, presence of viral mutation(s) within the areas targeted by this assay, and inadequate number of viral copies (<250 copies / mL). A negative result must be combined with clinical observations, patient history, and epidemiological information. Fact Sheet for Patients:   [facility] Fact Sheet for Healthcare  Providers: 06/11/19 This test is not yet approved or cleared  by the [employer] FDA and has been authorized for detection and/or diagnosis of SARS-CoV-2 by FDA under  an Emergency Use Authorization (EUA).  This EUA will remain in effect (meaning this test can be used) for the duration of the COVID-19 declaration under Section 564(b)(1) of the Act, 21 U.S.C. section 360bbb-3(b)(1), unless the authorization is terminated or revoked sooner. Performed at [facility], [address] [place], [place] [address]   MRSA PCR Screening     Status: Abnormal   Collection Time: 06/10/19  5:48 AM   Specimen: Nasal Mucosa; Nasopharyngeal  Result Value Ref Range Status   MRSA by PCR POSITIVE (A) NEGATIVE Final    Comment:        The GeneXpert MRSA Assay (FDA approved for NASAL specimens only), is one component of a comprehensive MRSA colonization surveillance program. It is not intended to diagnose MRSA infection nor to guide or monitor treatment for MRSA infections. RESULT CALLED TO, READ BACK BY AND VERIFIED WITH: Karis Juba RN AT [phone] ON 06/10/19 [facility] Performed at [facility], [address] [place], [place] [address]   Expectorated sputum assessment w rflx to resp cult     Status: None   Collection Time: 06/11/19  5:20 AM   Specimen: Expectorated Sputum  Result Value Ref Range Status   Specimen Description EXPECTORATED SPUTUM  Final   Special Requests NONE  Final   Sputum evaluation   Final    THIS SPECIMEN IS ACCEPTABLE FOR SPUTUM CULTURE Performed at [facility], [address], [place], [place] [address]    Report Status 06/11/2019 FINAL  Final  Culture, respiratory     Status: None (Preliminary result)   Collection Time: 06/11/19  5:20 AM  Result Value Ref Range Status   Specimen Description   Final    EXPECTORATED SPUTUM Performed at [facility], [address], [place], [place] [address]     Special Requests   Final    NONE Reflexed from [id number] Performed at [facility], [address] [place], [place] [address]    Gram Stain   Final    FEW WBC PRESENT, PREDOMINANTLY PMN MODERATE GRAM POSITIVE COCCI FEW GRAM NEGATIVE RODS    Culture   Final    FEW STENOTROPHOMONAS MALTOPHILIA SUSCEPTIBILITIES TO FOLLOW Performed at [facility], [address], [place], [place] [address]    Report Status PENDING  Incomplete  Resp Panel by RT PCR (RSV, Flu A&B, Covid) - Urine, Clean Catch     Status: None   Collection Time: 06/11/19  9:03 AM   Specimen: Urine, Clean Catch  Result Value Ref Range Status   SARS Coronavirus 2 by RT PCR NEGATIVE NEGATIVE Final    Comment: (NOTE) SARS-CoV-2 target nucleic acids are NOT DETECTED. The SARS-CoV-2 RNA is generally detectable in upper respiratoy specimens during the acute phase of infection. The lowest concentration of SARS-CoV-2 viral copies this assay can detect is 131 copies/mL. A negative result does not preclude SARS-Cov-2 infection and should not be used as the sole basis for treatment or other patient management decisions. A negative result may occur with  improper specimen collection/handling, submission of specimen other than nasopharyngeal swab, presence of viral mutation(s) within the areas targeted by this assay, and inadequate number of viral copies (<131 copies/mL). A negative result must be combined with clinical observations, patient history, and epidemiological information. The expected result is Negative. Fact Sheet for Patients:  https://www.moore.com/ Fact Sheet for Healthcare Providers:  https://www.young.biz/ This test is not yet ap proved or cleared by the [place] FDA and  has  been authorized for detection and/or diagnosis of SARS-CoV-2 by FDA under an Emergency Use Authorization (EUA). This EUA will remain  in effect (meaning this test can be used) for the  duration of the COVID-19 declaration under Section 564(b)(1) of the Act, 21 U.S.C. section 360bbb-3(b)(1), unless the authorization is terminated or revoked sooner.    Influenza A by PCR NEGATIVE NEGATIVE Final   Influenza B by PCR NEGATIVE NEGATIVE Final    Comment: (NOTE) The Xpert Xpress SARS-CoV-2/FLU/RSV assay is intended as an aid in  the diagnosis of influenza from Nasopharyngeal swab specimens and  should not be used as a sole basis for treatment. Nasal washings and  aspirates are unacceptable for Xpert Xpress SARS-CoV-2/FLU/RSV  testing. Fact Sheet for Patients: https://www.moore.com/ Fact Sheet for Healthcare Providers: https://www.young.biz/ This test is not yet approved or cleared by the [place] FDA and  has been authorized for detection and/or diagnosis of SARS-CoV-2 by  FDA under an Emergency Use Authorization (EUA). This EUA will remain  in effect (meaning this test can be used) for the duration of the  Covid-19 declaration under Section 564(b)(1) of the Act, 21  U.S.C. section 360bbb-3(b)(1), unless the authorization is  terminated or revoked.    Respiratory Syncytial Virus by PCR NEGATIVE NEGATIVE Final    Comment: (NOTE) Fact Sheet for Patients: https://www.moore.com/ Fact Sheet for Healthcare Providers: https://www.young.biz/ This test is not yet approved or cleared by the [place] FDA and  has been authorized for detection and/or diagnosis of SARS-CoV-2 by  FDA under an Emergency Use Authorization (EUA). This EUA will remain  in effect (meaning this test can be used) for the duration of the  COVID-19 declaration under Section 564(b)(1) of the Act, 21 U.S.C.  section 360bbb-3(b)(1), unless the authorization is terminated or  revoked. Performed at [facility], [address] [place], [place] [address]      RN Pressure Injury Documentation:     Estimated  body mass index is 27.78 kg/m as calculated from the following:   Height as of this encounter: 6' (1.829 m).   Weight as of this encounter: 92.9 kg.  Malnutrition Type:  Nutrition Problem: Increased nutrient needs Etiology: catabolic illness(COPD)   Malnutrition Characteristics:  Signs/Symptoms: estimated needs   Nutrition Interventions:  Interventions: Refer to RD note for recommendations Radiology Studies: CT ANGIO CHEST PE W OR WO CONTRAST  Result Date: 06/12/2019 CLINICAL DATA:  COPD exacerbation, wheezing EXAM: CT ANGIOGRAPHY CHEST WITH CONTRAST TECHNIQUE: Multidetector CT imaging of the chest was performed using the standard protocol during bolus administration of intravenous contrast. Multiplanar CT image reconstructions and MIPs were obtained to evaluate the vascular anatomy. CONTRAST:  75mL OMNIPAQUE IOHEXOL 350 MG/ML SOLN COMPARISON:  03/30/2013 FINDINGS: Cardiovascular: Satisfactory opacification of the pulmonary arteries to the segmental level. No evidence of pulmonary embolism. Normal heart size. Three-vessel coronary artery calcifications. No pericardial effusion. Aortic atherosclerosis. Left subclavian artery stent. Mediastinum/Nodes: No enlarged mediastinal, hilar, or axillary lymph nodes. Thyroid gland, trachea, and esophagus demonstrate no significant findings. Lungs/Pleura: Moderate predominantly centrilobular emphysema. Diffuse bilateral bronchial wall thickening. Generally bandlike dependent bibasilar atelectasis or consolidations. Upper Abdomen: No acute abnormality. Musculoskeletal: No chest wall abnormality. No acute or significant osseous findings. Review of the MIP images confirms the above findings. IMPRESSION: 1. Negative examination for pulmonary embolism. 2. Generally bandlike dependent bibasilar atelectasis or consolidations, which may reflect bland atelectasis or alternately infection or aspiration. 3. Diffuse bilateral bronchial wall thickening, consistent with  nonspecific infectious or inflammatory bronchitis. 4. Emphysema (  ICD10-J43.9). 5. Coronary artery disease. Aortic Atherosclerosis (ICD10-I70.0). Electronically Signed   By: Lauralyn Primes M.D.   On: 06/12/2019 11:19   DG Chest Port 1 View  Result Date: 06/12/2019 CLINICAL DATA:  Dyspnea EXAM: PORTABLE CHEST 1 VIEW COMPARISON:  06/09/2019 FINDINGS: Cardiac shadows within normal limits. Aortic calcifications are noted. Stimulator is noted over the midthoracic spine. Stable bibasilar scarring is noted. No new focal infiltrate is seen. Postsurgical changes in the cervical spine are noted. IMPRESSION: Stable bibasilar scarring.  No other focal abnormality is noted. Electronically Signed   By: Alcide Clever M.D.   On: 06/12/2019 01:26    Scheduled Meds: . arformoterol  15 mcg Nebulization BID  . aspirin EC  81 mg Oral Daily  . atorvastatin  80 mg Oral QHS  . budesonide (PULMICORT) nebulizer solution  0.25 mg Nebulization BID  . busPIRone  10 mg Oral BID  . Chlorhexidine Gluconate Cloth  6 each Topical Q0600  . cholecalciferol  2,000 Units Oral Daily  . DULoxetine  60 mg Oral Daily  . guaiFENesin  1,200 mg Oral BID  . insulin aspart  0-15 Units Subcutaneous TID WC  . insulin aspart  0-5 Units Subcutaneous QHS  . insulin aspart  5 Units Subcutaneous TID WC  . insulin glargine  26 Units Subcutaneous QHS  . ipratropium-albuterol  3 mL Nebulization Q6H  . lamoTRIgine  100 mg Oral Daily  . methylPREDNISolone (SOLU-MEDROL) injection  40 mg Intravenous Q8H  . metoprolol succinate  25 mg Oral Daily  . mupirocin ointment  1 application Nasal BID  . nicotine  21 mg Transdermal Daily  . pantoprazole  40 mg Oral Daily  . pregabalin  200 mg Oral TID  . Ensure Max Protein  11 oz Oral BID BM  . rivaroxaban  20 mg Oral Q supper  . roflumilast  250 mcg Oral Daily   Continuous Infusions: . sodium chloride 1,000 mL (06/10/19 2208)  . azithromycin 500 mg (06/12/19 1800)  . cefTRIAXone (ROCEPHIN)  IV 2 g  (06/12/19 1704)    LOS: 3 days   Merlene Laughter, DO [facility] PAGER is on AMION  If 7PM-7AM, please contact night-coverage [email or web address]

## 2019-06-14 ENCOUNTER — Inpatient Hospital Stay: Payer: Medicare Other

## 2019-06-14 LAB — CBC WITH DIFFERENTIAL/PLATELET
Abs Immature Granulocytes: 0.11 10*3/uL — ABNORMAL HIGH (ref 0.00–0.07)
Basophils Absolute: 0 10*3/uL (ref 0.0–0.1)
Basophils Relative: 0 %
Eosinophils Absolute: 0 10*3/uL (ref 0.0–0.5)
Eosinophils Relative: 0 %
HCT: 44.8 % (ref 39.0–52.0)
Hemoglobin: 14.6 g/dL (ref 13.0–17.0)
Immature Granulocytes: 1 %
Lymphocytes Relative: 7 %
Lymphs Abs: 0.7 10*3/uL (ref 0.7–4.0)
MCH: 28.7 pg (ref 26.0–34.0)
MCHC: 32.6 g/dL (ref 30.0–36.0)
MCV: 88.2 fL (ref 80.0–100.0)
Monocytes Absolute: 0.7 10*3/uL (ref 0.1–1.0)
Monocytes Relative: 6 %
Neutro Abs: 9.3 10*3/uL — ABNORMAL HIGH (ref 1.7–7.7)
Neutrophils Relative %: 86 %
Platelets: 218 10*3/uL (ref 150–400)
RBC: 5.08 MIL/uL (ref 4.22–5.81)
RDW: 15.5 % (ref 11.5–15.5)
WBC: 10.9 10*3/uL — ABNORMAL HIGH (ref 4.0–10.5)
nRBC: 0 % (ref 0.0–0.2)

## 2019-06-14 LAB — COMPREHENSIVE METABOLIC PANEL
ALT: 19 U/L (ref 0–44)
AST: 17 U/L (ref 15–41)
Albumin: 2.9 g/dL — ABNORMAL LOW (ref 3.5–5.0)
Alkaline Phosphatase: 65 U/L (ref 38–126)
Anion gap: 8 (ref 5–15)
BUN: 27 mg/dL — ABNORMAL HIGH (ref 8–23)
CO2: 34 mmol/L — ABNORMAL HIGH (ref 22–32)
Calcium: 8.4 mg/dL — ABNORMAL LOW (ref 8.9–10.3)
Chloride: 94 mmol/L — ABNORMAL LOW (ref 98–111)
Creatinine, Ser: 1 mg/dL (ref 0.61–1.24)
GFR calc Af Amer: >60 mL/min (ref >60)
GFR calc non Af Amer: >60 mL/min (ref >60)
Glucose, Bld: 282 mg/dL — ABNORMAL HIGH (ref 70–99)
Potassium: 4.9 mmol/L (ref 3.5–5.1)
Sodium: 136 mmol/L (ref 135–145)
Total Bilirubin: 0.3 mg/dL (ref 0.3–1.2)
Total Protein: 5.4 g/dL — ABNORMAL LOW (ref 6.5–8.1)

## 2019-06-14 LAB — PHOSPHORUS: Phosphorus: 4.3 mg/dL (ref 2.5–4.6)

## 2019-06-14 LAB — GLUCOSE, CAPILLARY
Glucose-Capillary: 328 mg/dL — ABNORMAL HIGH (ref 70–99)
Glucose-Capillary: 97 mg/dL (ref 70–99)

## 2019-06-14 LAB — MAGNESIUM: Magnesium: 2.2 mg/dL (ref 1.7–2.4)

## 2019-06-14 MED ORDER — PREDNISOLONE 5 MG PO TABS: 5.0000 mg | ORAL_TABLET | Freq: Every day | ORAL | 0 refills | Status: DC

## 2019-06-14 MED ORDER — SULFAMETHOXAZOLE-TRIMETHOPRIM 800-160 MG PO TABS
1.0000 | ORAL_TABLET | Freq: Two times a day (BID) | ORAL | 0 refills | Status: AC
Start: 2019-06-14 — End: 2019-06-19

## 2019-06-14 MED ORDER — ENSURE MAX PROTEIN PO LIQD: 11.0000 [oz_av] | Freq: Two times a day (BID) | ORAL | 0 refills | Status: AC

## 2019-06-14 MED ORDER — NICOTINE 21 MG/24HR TD PT24: 21.0000 mg | MEDICATED_PATCH | Freq: Every day | TRANSDERMAL | 0 refills | Status: DC

## 2019-06-14 MED ORDER — IPRATROPIUM-ALBUTEROL 0.5-2.5 (3) MG/3ML IN SOLN: 3.0000 mL | Freq: Four times a day (QID) | RESPIRATORY_TRACT | 0 refills | Status: AC | PRN

## 2019-06-14 MED ORDER — ROFLUMILAST 500 MCG PO TABS: 250.0000 ug | ORAL_TABLET | Freq: Every day | ORAL | 0 refills | Status: DC

## 2019-06-14 MED ORDER — PREDNISONE 10 MG (21) PO TBPK
ORAL_TABLET | ORAL | 0 refills | Status: DC
Start: 2019-06-14 — End: 2019-09-13

## 2019-06-14 NOTE — Discharge Summary (Signed)
Physician Discharge Summary  Gregory Hall ONG:[id number] DOB: 18-Nov-1955 DOA: 06/09/2019  PCP: Armando Gang, FNP  Admit date: 06/09/2019 Discharge date: 06/14/2019  Admitted From: Home Disposition: Home with Home Health PT/OT  Recommendations for Outpatient Follow-up:  1. Follow up with PCP in 1-2 weeks 2. Follow-up with pulmonary in 1 to 2 weeks  3. Follow-up with pain clinic in outpatient setting 4. Please obtain CMP/CBC, Mag, Phos in one week 5. Please follow up on the following pending results:  Home Health: YES Equipment/Devices: RW, BSC    Discharge Condition: Stable  CODE STATUS: FULL CODE Diet recommendation: Heart Healthy Carb Modified Diet  Brief/Interim Summary: HPI per Dr. Lindajo Royal on 06/09/19 Gregory Hall a 64 y.o.malewith medical history significant forCOPD on home O2 at 2 L, CAD with history of PCI of the LAD in 2010, last stress test 2017 low risk, chronic pain as well as history of anxiety on anxiolytics, who presents to the emergency room with a1-day history of wheezing, not improving with home bronchodilator therapy. He has associatedchest congestion and initially onretrosternal chest tightnessof moderate intensity, nonradiating. Has no associated nausea vomiting diaphoresis, lightheadedness or palpitations. Denies cough fever or chills.  ED Course:On arrival, patient was afebrile, tachycardic to 24 with O2 sat 88% on home flow rate of 2 L improving to the mid 90s on 4 L. Vitals were otherwise unremarkable. Troponin was 5 with a BNP of 51. Venous blood gas showed PCO2 of 68.[name] cell count normal at 8600. Noted to have an elevated blood sugar of 272 with no known history of diabetes. Chest x-ray showed no acute disease. Patient was treated with several rounds of DuoNeb but continued to have increased work of breathing. Hospitalist consulted for admission.  **Interim History Continues to be dyspneic and when to use  supplemental oxygen via nasal cannula.  Has minimal improvement.  We adjusted his medications further and since he is not improving significantly will obtain a pulmonary consult and a CT of the chest to rule out PE.  Pulmonary evaluated and CT scan was done and pulmonary adjusted some of his medications.  Patient improved significantly and was close to his baseline so is deemed stable to be discharged will need to follow-up with PCP, pulmonary and pain clinic in outpatient setting.  He will be discharged on antibiotics with Bactrim and given a prednisone taper until he resumes his home prednisolone.  Discharge Diagnoses:  Principal Problem:   Acute on chronic respiratory failure with hypoxia and hypercapnia (HCC) Active Problems:   Chest pain   COPD with acute exacerbation (HCC)   CAD (coronary artery disease)   HTN (hypertension)   Anxiety   Chronic prescription opiate use   Hyperglycemia   Diabetes mellitus type 2, uncontrolled, with complications (HCC)   Chronic pain syndrome   COPD exacerbation (HCC)  Acute on chronic respiratory failure with hypoxia and hypercapnia in the setting of Acute COPD exacerbation, improved -5-day course antibiotics with Ceftriaxone and Azithromycin will be changed to Bactrim due to his Sputum Cx findings  -Solu-Medrol 60 mgBID increased to TID given persistent wheezing the day before yesterday but has now been reduced to 40 mg TID by Pulmonary and will reduce to 40 mg q12h -DuoNeb scheduled q6h and C/w Albuterol 2.5 mg Neb q2hprn Wheezing and SOB -Added Budesonide 0.25 mg BID and Arformoterol 15 mcg Neb BID  -Takes Trelegy Ellipta 1 puff into the lungs daily at home and can resume when closer to D/C -Flutter valve -  Incentive spirometer -Increase Guaifenesin to 1200 mg po BID; States he is expectorating more now -Restarted Home Lasix 20 mg po Daily PRN -6/2 lactic acid/Procalcitonin Negative -Sputum Gram Stain and Cx showed: Gram Stain FEW WBC PRESENT,  PREDOMINANTLY PMN  MODERATE GRAM POSITIVE COCCI  FEW GRAM NEGATIVE RODS  Performed at [facility], [address], [place], [place] [address]   Culture FEW STENOTROPHOMONAS MALTOPHILIA   -STENOTROPHOMONAS MALTOPHILIA is sensitive to Levofloxacin and TMP/SMX and will change to Bactrim for coverage  for 5 more days -SpO2: 94 % O2 Flow Rate (L/min): 2 L/min FiO2 (%): 32 % -C/w Droplet Precautions and will get Respiratory Virus Panel and negative for RSV, Infulenza A/B and SARS-CoV-2 -We will need an ambulatory home O2 screen prior to discharge -Continuous pulse oximetry and maintain O2 saturations greater than 90% -Since he is not improving will obtain a CTA of the chest to rule out PE and obtain a pulmonary consult -CTA of the Chest showed "Negative examination for pulmonary embolism.  Generally bandlike dependent bibasilar atelectasis or consolidations, which may reflect bland atelectasis or alternately infection or aspiration. Diffuse bilateral bronchial wall thickening, consistent with nonspecific infectious or inflammatory bronchitis. Emphysema. Coronary artery disease. Aortic Atherosclerosis." -Follow up on Pulmonary Recommendations: Dr. Karna Christmas recommending decreasing the Solu-Medrol from 60 mg to 40 mg 3 times daily (will go to 40 mg q12h yesterday and 40 daily today and changed to p.o. prednisone taper), adding chest physiotherapy with MetaNeb utilizing saline every 4 hours as well as Mucomyst for mL 20% twice daily -Pulmonary also recommending Acapella device is using as able several times daily as well as Roflumilast 250 mcg po Daily  -Repeat CXR this AM showed improvement -C/w PT/OT Efforts and recommending home health -Only cleared the patient for discharge and agrees with sending patient home on Bactrim.  He will go home on Roflumilast and Trelegy.  He will need to follow-up with PCP and pulmonary in outpatient setting  Chest Pain -Trend troponin most likely secondary to  COPD exacerbation but given his cardiac history we will check -Intial Troponin was 13 and repeat was 11 -Hx PCI to LAD 2010 -NTG sublingualPRN, morphine for breakthrough  DM type II uncontrolled with complication -08/24/2011 hemoglobin A1c=8.3 -Hemoglobin A1c 7.9 this visit -Hold home Metformin 500 g p.o. daily with breakfast; resume home Metformin -Lipid panel done and unremarkable  -Lantus 26 units daily -NovoLog 4 Units TID will be increased to 5 units TID if patient eats >50% of his meals and will further increase to 7 units per Diabetes Education Coordinator Recc's -Change Moderate SSI q4h to AC/HS -We will consult diabetes education coordinator for further evaluation recommendations given that we are increasing his steroids from 60 mg IV every 12 to every 8 hours -CBGs have been ranging from  97-3 28 on the last 3 checks and Blood Sugar was 255 this AM on CMP  Anxiety/Depression/Bipolar? -Continue with buspirone 10 mgBID -Continue with duloxetine 60 mg daily -Continue Lamictal 100 mg daily -Continue with Pregabalin follow-up in outpatient setting  Dyslipidemia -Lipid panel was normal and showed total cholesterol/HDL ratio 2.5, cholesterol level of 110, HDL 44, LDL of 53, triglycerides of 65, VLDL 13 -Continue atorvastatin 80 mg p.o. nightly  GERD -Continue PPI with Pantoprazole 40 g daily  Leukocytosis -The setting of steroid demargination -Patient CBC went from 9.3 peaked at 15.6 and is now improing and is 10.9 -PCT has been <0.10 x3 -Continue to monitor for signs and symptoms of infection  -  Continue with antibiotics as above  -Repeat CBC within 1-2 weeks  Chronic pain on Chronic Opiates -Continue with as needed oxycodone and I have increased the dose from 10 mg every 6 hours as needed to every 4 hours as needed given that this is his home dose -Lyrica 200 mgTID -Holding his home cyclobenzaprine 5 mg p.o. 3 times daily as needed for muscle spasms for  now -Write for pain prescription at discharge given that the review of his PDMP shows that he has gotten pain prescription from 10 different providers.  He will need to follow-up with the pain clinic  Tobacco Abuse -C/w Nicotine patch -Dr. Joseph Art discussed at length about the reason patient need to discontinue smoking to include DEATH  Insomnia -Resume his home Trazodone 100 mg p.o. nightly as needed  Discharge Instructions  Discharge Instructions    Call MD for:  difficulty breathing, headache or visual disturbances   Complete by: As directed    Call MD for:  extreme fatigue   Complete by: As directed    Call MD for:  hives   Complete by: As directed    Call MD for:  persistant dizziness or light-headedness   Complete by: As directed    Call MD for:  persistant nausea and vomiting   Complete by: As directed    Call MD for:  redness, tenderness, or signs of infection (pain, swelling, redness, odor or green/yellow discharge around incision site)   Complete by: As directed    Call MD for:  severe uncontrolled pain   Complete by: As directed    Call MD for:  temperature >100.4   Complete by: As directed    Diet - low sodium heart healthy   Complete by: As directed    Discharge instructions   Complete by: As directed    You were cared for by a hospitalist during your hospital stay. If you have any questions about your discharge medications or the care you received while you were in the hospital after you are discharged, you can call the unit and ask to speak with the hospitalist on call if the hospitalist that took care of you is not available. Once you are discharged, your primary care physician will handle any further medical issues. Please note that NO REFILLS for any discharge medications will be authorized once you are discharged, as it is imperative that you return to your primary care physician (or establish a relationship with a primary care physician if you do not have one) for  your aftercare needs so that they can reassess your need for medications and monitor your lab values.  Follow up with PCP, Pulmonary, and Pain Clinc. Take all medications as prescribed. If symptoms change or worsen please return to the ED for evaluation   Increase activity slowly   Complete by: As directed      Allergies as of 06/14/2019      Reactions   Acetaminophen Other (See Comments)   Reaction:  Unknown  Kidney failure Told not to take from home M.D. Related to kidney and renal failure   Nsaids Other (See Comments)   Reaction:  Unknown  Kidney failure Patient states not to take from home M.D. Related to kidney and renal failure. Other reaction(s): Unknown      Medication List    TAKE these medications   aspirin EC 81 MG tablet Take 81 mg by mouth daily.   atorvastatin 80 MG tablet Commonly known as: LIPITOR Take 80 mg  by mouth at bedtime.   cyclobenzaprine 5 MG tablet Commonly known as: FLEXERIL Take 1 tablet (5 mg total) by mouth 3 (three) times daily as needed. What changed: reasons to take this   DULoxetine 60 MG capsule Commonly known as: CYMBALTA Take 60 mg by mouth daily.   Ensure Max Protein Liqd Take 330 mLs (11 oz total) by mouth 2 (two) times daily between meals.   furosemide 20 MG tablet Commonly known as: LASIX Take 20 mg by mouth daily as needed for fluid.   guaiFENesin 600 MG 12 hr tablet Commonly known as: MUCINEX Take 600 mg by mouth 2 (two) times daily.   insulin glargine 100 UNIT/ML injection Commonly known as: LANTUS Inject 26 Units into the skin at bedtime.   insulin lispro 100 UNIT/ML injection Commonly known as: HUMALOG Inject 4 Units into the skin 3 (three) times daily before meals. Hold for FSBG <100   ipratropium-albuterol 0.5-2.5 (3) MG/3ML Soln Commonly known as: DUONEB Take 3 mLs by nebulization every 6 (six) hours as needed.   lactulose 10 GM/15ML solution Commonly known as: CHRONULAC Take 20 g by mouth daily as needed  for mild constipation.   lamoTRIgine 100 MG tablet Commonly known as: LAMICTAL Take 100 mg by mouth daily.   metFORMIN 500 MG 24 hr tablet Commonly known as: GLUCOPHAGE-XR Take 500 mg by mouth daily with breakfast.   metoprolol tartrate 25 MG tablet Commonly known as: LOPRESSOR Take 12.5 mg by mouth 2 (two) times daily.   mupirocin cream 2 % Commonly known as: Bactroban Apply to affected area 3 times daily   nicotine 21 mg/24hr patch Commonly known as: NICODERM CQ - dosed in mg/24 hours Place 1 patch (21 mg total) onto the skin daily.   Nitrostat 0.4 MG SL tablet Generic drug: nitroGLYCERIN DISSOLVE (1) TABLET UNDER TONGUE AS NEEDED TO RELIEVE CHEST PAIN. MAYREPEAT EVERY 5 MINUTES. What changed: See the new instructions.   ondansetron 4 MG tablet Commonly known as: ZOFRAN Take 4 mg by mouth daily as needed for nausea or vomiting.   oxymetazoline 0.05 % nasal spray Commonly known as: AFRIN Place 2 sprays into both nostrils 2 (two) times daily as needed for congestion.   pantoprazole 40 MG tablet Commonly known as: PROTONIX Take 40 mg by mouth daily.   prednisoLONE 5 MG Tabs tablet Take 1 tablet (5 mg total) by mouth daily. Start taking on: June 21, 2019 What changed: These instructions start on June 21, 2019. If you are unsure what to do until then, ask your doctor or other care provider.   predniSONE 10 MG (21) Tbpk tablet Commonly known as: STERAPRED UNI-PAK 21 TAB Take 6 tablets on day 1, 5 tablets on day 2, 4 tablets on day 3, 3 tablets on day 4, 2 tablets on day 5, 1 tablet on day 6 and then resume home prednisone 5 mg p.o. daily from then on   pregabalin 200 MG capsule Commonly known as: LYRICA Take 200 mg by mouth 3 (three) times daily.   rivaroxaban 20 MG Tabs tablet Commonly known as: XARELTO Take 20 mg by mouth daily with supper.   roflumilast 500 MCG Tabs tablet Commonly known as: DALIRESP Take 0.5 tablets (250 mcg total) by mouth daily. Start  taking on: June 15, 2019   Salonpas 3.01-15-08 % Ptch Generic drug: Camphor-Menthol-Methyl Sal Apply 1 patch topically every 12 (twelve) hours as needed (pain).   senna-docusate 8.6-50 MG tablet Commonly known as: Senokot-S Take 1 tablet by mouth  daily.   sulfamethoxazole-trimethoprim 800-160 MG tablet Commonly known as: BACTRIM DS Take 1 tablet by mouth 2 (two) times daily for 5 days.   Trelegy Ellipta 100-62.5-25 MCG/INH Aepb Generic drug: Fluticasone-Umeclidin-Vilant Inhale 1 puff into the lungs daily.   Vitamin D3 50 MCG (2000 UT) Tabs Take 2,000 Units by mouth daily.            Durable Medical Equipment  (From admission, onward)         Start     Ordered   06/14/19 1229  DME Tub Bench  Once     06/14/19 1231         Follow-up Information    Armando Gang, FNP. Call.   Specialty: Family Medicine Why: Follow up within 1 week Contact information: [address] [place] [address] [phone]        Vida Rigger, MD. Call.   Specialty: Pulmonary Disease Why: Follow up within 1-2 weeks Contact information: [address] [place] [address] [phone]          Allergies  Allergen Reactions  . Acetaminophen Other (See Comments)    Reaction:  Unknown  Kidney failure Told not to take from home M.D. Related to kidney and renal failure   . Nsaids Other (See Comments)    Reaction:  Unknown  Kidney failure Patient states not to take from home M.D. Related to kidney and renal failure.  Other reaction(s): Unknown   Consultations:  Pulmonary  Procedures/Studies: DG Chest 1 View  Result Date: 06/14/2019 CLINICAL DATA:  Shortness of breath EXAM: CHEST  1 VIEW COMPARISON:  Yesterday FINDINGS: Streaky opacity at both lung bases, mildly improved on the right. Hyperinflation and apical emphysema. Normal heart size and mediastinal contours. ACDF, left subclavian stenting, and spinal stimulators. IMPRESSION: 1. Mild improvement in  atelectasis. 2. COPD. Electronically Signed   By: Marnee Spring M.D.   On: 06/14/2019 07:50   DG Chest 1 View  Result Date: 06/13/2019 CLINICAL DATA:  Shortness of breath EXAM: CHEST  1 VIEW COMPARISON:  06/12/2019 FINDINGS: Heart size within normal limits. Atherosclerotic calcification of the aortic knob. Vascular stent again noted at the superior mediastinum. Stable bandlike opacities within the bilateral lung bases. No new focal airspace consolidation. No pleural effusion or pneumothorax. Partially visualized lower cervical ACDF hardware. Spinal stimulator leads within the lower thoracic spine. IMPRESSION: Stable bandlike opacities within the bilateral lung bases. No new focal airspace consolidation. Electronically Signed   By: Duanne Guess D.O.   On: 06/13/2019 08:07   DG Chest 2 View  Result Date: 05/25/2019 CLINICAL DATA:  Wheezing. Status post fall EXAM: CHEST - 2 VIEW COMPARISON:  10/07/2015 FINDINGS: Signs of stenting along the LEFT superior mediastinal border, evidence of cervical spinal fusion and with nerve stimulator in place projecting over midthoracic spine. Cardiomediastinal contours are normal. Hilar structures with stable appearance. Hyperinflation without consolidation or sign of pleural effusion. Visualized skeletal structures are unremarkable. IMPRESSION: No active cardiopulmonary disease. Signs of COPD. Electronically Signed   By: Donzetta Kohut M.D.   On: 05/25/2019 17:06   CT ANGIO CHEST PE W OR WO CONTRAST  Result Date: 06/12/2019 CLINICAL DATA:  COPD exacerbation, wheezing EXAM: CT ANGIOGRAPHY CHEST WITH CONTRAST TECHNIQUE: Multidetector CT imaging of the chest was performed using the standard protocol during bolus administration of intravenous contrast. Multiplanar CT image reconstructions and MIPs were obtained to evaluate the vascular anatomy. CONTRAST:  62mL OMNIPAQUE IOHEXOL 350 MG/ML SOLN COMPARISON:  03/30/2013 FINDINGS: Cardiovascular:  Satisfactory opacification of  the pulmonary arteries to the segmental level. No evidence of pulmonary embolism. Normal heart size. Three-vessel coronary artery calcifications. No pericardial effusion. Aortic atherosclerosis. Left subclavian artery stent. Mediastinum/Nodes: No enlarged mediastinal, hilar, or axillary lymph nodes. Thyroid gland, trachea, and esophagus demonstrate no significant findings. Lungs/Pleura: Moderate predominantly centrilobular emphysema. Diffuse bilateral bronchial wall thickening. Generally bandlike dependent bibasilar atelectasis or consolidations. Upper Abdomen: No acute abnormality. Musculoskeletal: No chest wall abnormality. No acute or significant osseous findings. Review of the MIP images confirms the above findings. IMPRESSION: 1. Negative examination for pulmonary embolism. 2. Generally bandlike dependent bibasilar atelectasis or consolidations, which may reflect bland atelectasis or alternately infection or aspiration. 3. Diffuse bilateral bronchial wall thickening, consistent with nonspecific infectious or inflammatory bronchitis. 4. Emphysema (ICD10-J43.9). 5. Coronary artery disease. Aortic Atherosclerosis (ICD10-I70.0). Electronically Signed   By: Lauralyn Primes M.D.   On: 06/12/2019 11:19   CT Lumbar Spine Wo Contrast  Result Date: 05/25/2019 CLINICAL DATA:  Fall. Low back pain. EXAM: CT LUMBAR SPINE WITHOUT CONTRAST TECHNIQUE: Multidetector CT imaging of the lumbar spine was performed without intravenous contrast administration. Multiplanar CT image reconstructions were also generated. COMPARISON:  04/07/2019 FINDINGS: Segmentation: 5 lumbar type vertebrae. Alignment: Unchanged grade 1 anterolisthesis of L4 on L5. Vertebrae: The L1 superior endplate compression fracture on the prior CT demonstrates progressive, mild vertebral body height loss and sclerosis along the fracture without retropulsion or posterior element involvement. No acute lumbar spine fracture is identified. Nondisplaced bilateral  sacral fractures also demonstrate new sclerosis/healing and are again noted to extend transversely across the anterior S2 segment. Sequelae of L4-5 posterior and interbody fusion are again identified with unchanged hardware positioning and no evidence of screw loosening. Paraspinal and other soft tissues: Abdominal aortic atherosclerosis without aneurysm. Postsurgical changes in the posterior lumbar soft tissues. Partially visualized thoracic spinal cord stimulator. Disc levels: T12-L1 through L2-3: No significant findings. L3-4: Mild disc space narrowing. Right eccentric disc bulging and facet and ligamentum flavum hypertrophy result in mild-to-moderate spinal stenosis and mild-to-moderate right neural foraminal stenosis, unchanged. L4-5: Previous wide posterior decompression and fusion. Limited assessment of the spinal canal due to streak artifact. No significant stenosis identified. L5-S1: Moderate right greater than left facet arthrosis without stenosis, unchanged. IMPRESSION: 1. Subacute L1 compression fracture with mildly progressive height loss. 2. Healing bilateral sacral fractures. 3. No new fracture identified. 4. Unchanged disc and facet degeneration at L3-4 with mild-to-moderate spinal stenosis. 5. Aortic Atherosclerosis (ICD10-I70.0). Electronically Signed   By: Sebastian Ache M.D.   On: 05/25/2019 17:25   DG Chest Port 1 View  Result Date: 06/12/2019 CLINICAL DATA:  Dyspnea EXAM: PORTABLE CHEST 1 VIEW COMPARISON:  06/09/2019 FINDINGS: Cardiac shadows within normal limits. Aortic calcifications are noted. Stimulator is noted over the midthoracic spine. Stable bibasilar scarring is noted. No new focal infiltrate is seen. Postsurgical changes in the cervical spine are noted. IMPRESSION: Stable bibasilar scarring.  No other focal abnormality is noted. Electronically Signed   By: Alcide Clever M.D.   On: 06/12/2019 01:26   DG Chest Port 1 View  Result Date: 06/09/2019 CLINICAL DATA:  Dyspnea EXAM:  PORTABLE CHEST 1 VIEW COMPARISON:  05/25/2019 FINDINGS: Cardiac shadow is within normal limits. Aortic calcifications are noted. Spinal stimulator and left subclavian arterial stent are noted. Lungs are hyperinflated consistent with COPD. Bibasilar scarring is noted. No acute infiltrate is noted. IMPRESSION: COPD and bibasilar scarring.  No other focal abnormality is noted. Electronically Signed   By: Eulah Pont.D.  On: 06/09/2019 21:37     Subjective: Seen and examined at bedside and is feeling much better.  Close to his baseline.  No nausea or vomiting.  Denies any lightheadedness or dizziness.  No other concerns or complaints at this time.  Continues to wheeze but states he is feeling much better  Discharge Exam: Vitals:   06/14/19 1148 06/14/19 1418  BP:    Pulse:    Resp:    Temp:    SpO2: 95% 94%   Vitals:   06/14/19 0823 06/14/19 0922 06/14/19 1148 06/14/19 1418  BP:      Pulse:      Resp:      Temp:      TempSrc:      SpO2: 93% 92% 95% 94%  Weight:      Height:       General: Pt is alert, awake, not in acute distress Cardiovascular: RRR, S1/S2 +, no rubs, no gallops Respiratory: Diminished bilaterally with mild wheezing, no rhonchi; wearing supplemental oxygen via nasal cannula Abdominal: Soft, NT, distended secondary body habitus, bowel sounds + Extremities: Trace edema, no cyanosis  The results of significant diagnostics from this hospitalization (including imaging, microbiology, ancillary and laboratory) are listed below for reference.    Microbiology: Recent Results (from the past 240 hour(s))  SARS Coronavirus 2 by RT PCR (hospital order, performed in [facility] hospital lab) Nasopharyngeal Nasopharyngeal Swab     Status: None   Collection Time: 06/09/19 10:09 PM   Specimen: Nasopharyngeal Swab  Result Value Ref Range Status   SARS Coronavirus 2 NEGATIVE NEGATIVE Final    Comment: (NOTE) SARS-CoV-2 target nucleic acids are NOT DETECTED. The SARS-CoV-2 RNA  is generally detectable in upper and lower respiratory specimens during the acute phase of infection. The lowest concentration of SARS-CoV-2 viral copies this assay can detect is 250 copies / mL. A negative result does not preclude SARS-CoV-2 infection and should not be used as the sole basis for treatment or other patient management decisions.  A negative result may occur with improper specimen collection / handling, submission of specimen other than nasopharyngeal swab, presence of viral mutation(s) within the areas targeted by this assay, and inadequate number of viral copies (<250 copies / mL). A negative result must be combined with clinical observations, patient history, and epidemiological information. Fact Sheet for Patients:   BoilerBrush.com.cy Fact Sheet for Healthcare Providers: https://pope.com/ This test is not yet approved or cleared  by the [place] FDA and has been authorized for detection and/or diagnosis of SARS-CoV-2 by FDA under an Emergency Use Authorization (EUA).  This EUA will remain in effect (meaning this test can be used) for the duration of the COVID-19 declaration under Section 564(b)(1) of the Act, 21 U.S.C. section 360bbb-3(b)(1), unless the authorization is terminated or revoked sooner. Performed at [facility], [address] [place], [place] [address]   MRSA PCR Screening     Status: Abnormal   Collection Time: 06/10/19  5:48 AM   Specimen: Nasal Mucosa; Nasopharyngeal  Result Value Ref Range Status   MRSA by PCR POSITIVE (A) NEGATIVE Final    Comment:        The GeneXpert MRSA Assay (FDA approved for NASAL specimens only), is one component of a comprehensive MRSA colonization surveillance program. It is not intended to diagnose MRSA infection nor to guide or monitor treatment for MRSA infections. RESULT CALLED TO, READ BACK BY AND VERIFIED WITH: CANDACE SUMMERS RN AT [phone] ON  06/10/19 SNG  Performed at [facility], [address] [place], [place] [address]   Expectorated sputum assessment w rflx to resp cult     Status: None   Collection Time: 06/11/19  5:20 AM   Specimen: Expectorated Sputum  Result Value Ref Range Status   Specimen Description EXPECTORATED SPUTUM  Final   Special Requests NONE  Final   Sputum evaluation   Final    THIS SPECIMEN IS ACCEPTABLE FOR SPUTUM CULTURE Performed at [facility], [address], [place], [place] [address]    Report Status 06/11/2019 FINAL  Final  Culture, respiratory     Status: None   Collection Time: 06/11/19  5:20 AM  Result Value Ref Range Status   Specimen Description   Final    EXPECTORATED SPUTUM Performed at [facility], [address] [place], [place] [address]    Special Requests   Final    NONE Reflexed from [phone] Performed at [facility], [address] [place], [place] [address]    Gram Stain   Final    FEW WBC PRESENT, PREDOMINANTLY PMN MODERATE GRAM POSITIVE COCCI FEW GRAM NEGATIVE RODS Performed at [facility], [address] [place], [place] [address]    Culture FEW STENOTROPHOMONAS MALTOPHILIA  Final   Report Status 06/13/2019 FINAL  Final   Organism ID, Bacteria STENOTROPHOMONAS MALTOPHILIA  Final      Susceptibility   Stenotrophomonas maltophilia - MIC*    LEVOFLOXACIN 2 SENSITIVE Sensitive     TRIMETH/SULFA <=20 SENSITIVE Sensitive     * FEW STENOTROPHOMONAS MALTOPHILIA  Resp Panel by RT PCR (RSV, Flu A&B, Covid) - Urine, Clean Catch     Status: None   Collection Time: 06/11/19  9:03 AM   Specimen: Urine, Clean Catch  Result Value Ref Range Status   SARS Coronavirus 2 by RT PCR NEGATIVE NEGATIVE Final    Comment: (NOTE) SARS-CoV-2 target nucleic acids are NOT DETECTED. The SARS-CoV-2 RNA is generally detectable in upper respiratoy specimens during the acute phase of infection. The lowest concentration of SARS-CoV-2 viral  copies this assay can detect is 131 copies/mL. A negative result does not preclude SARS-Cov-2 infection and should not be used as the sole basis for treatment or other patient management decisions. A negative result may occur with  improper specimen collection/handling, submission of specimen other than nasopharyngeal swab, presence of viral mutation(s) within the areas targeted by this assay, and inadequate number of viral copies (<131 copies/mL). A negative result must be combined with clinical observations, patient history, and epidemiological information. The expected result is Negative. Fact Sheet for Patients:  https://www.moore.com/ Fact Sheet for Healthcare Providers:  https://www.young.biz/ This test is not yet ap proved or cleared by the [place] FDA and  has been authorized for detection and/or diagnosis of SARS-CoV-2 by FDA under an Emergency Use Authorization (EUA). This EUA will remain  in effect (meaning this test can be used) for the duration of the COVID-19 declaration under Section 564(b)(1) of the Act, 21 U.S.C. section 360bbb-3(b)(1), unless the authorization is terminated or revoked sooner.    Influenza A by PCR NEGATIVE NEGATIVE Final   Influenza B by PCR NEGATIVE NEGATIVE Final    Comment: (NOTE) The Xpert Xpress SARS-CoV-2/FLU/RSV assay is intended as an aid in  the diagnosis of influenza from Nasopharyngeal swab specimens and  should not be used as a sole basis for treatment. Nasal washings and  aspirates are unacceptable for Xpert Xpress SARS-CoV-2/FLU/RSV  testing. Fact Sheet  for Patients: https://www.moore.com/ Fact Sheet for Healthcare Providers: https://www.young.biz/ This test is not yet approved or cleared by the [place] FDA and  has been authorized for detection and/or diagnosis of SARS-CoV-2 by  FDA under an Emergency Use Authorization (EUA). This EUA will  remain  in effect (meaning this test can be used) for the duration of the  Covid-19 declaration under Section 564(b)(1) of the Act, 21  U.S.C. section 360bbb-3(b)(1), unless the authorization is  terminated or revoked.    Respiratory Syncytial Virus by PCR NEGATIVE NEGATIVE Final    Comment: (NOTE) Fact Sheet for Patients: https://www.moore.com/ Fact Sheet for Healthcare Providers: https://www.young.biz/ This test is not yet approved or cleared by the [place] FDA and  has been authorized for detection and/or diagnosis of SARS-CoV-2 by  FDA under an Emergency Use Authorization (EUA). This EUA will remain  in effect (meaning this test can be used) for the duration of the  COVID-19 declaration under Section 564(b)(1) of the Act, 21 U.S.C.  section 360bbb-3(b)(1), unless the authorization is terminated or  revoked. Performed at [facility], [address] [place], [place] [address]     Labs: BNP (last 3 results) Recent Labs    06/09/19 2119  BNP 51.5   Basic Metabolic Panel: Recent Labs  Lab 06/10/19 0719 06/11/19 0527 06/12/19 0532 06/13/19 0627 06/14/19 0555  NA 136 140 137 137 136  K 4.8 4.5 4.9 4.8 4.9  CL 94* 97* 93* 95* 94*  CO2 33* 34* 34* 34* 34*  GLUCOSE 370* 161* 214* 255* 282*  BUN 14 17 22  26* 27*  CREATININE 1.04 0.92 0.94 1.00 1.00  CALCIUM 8.6* 9.1 9.1 8.5* 8.4*  MG 1.9 1.9 2.1 2.1 2.2  PHOS 3.8 4.1 4.2 4.4 4.3   Liver Function Tests: Recent Labs  Lab 06/10/19 0719 06/11/19 0527 06/12/19 0532 06/13/19 0627 06/14/19 0555  AST 17 11* 12* 15 17  ALT 12 9 11 15 19   ALKPHOS 80 73 68 66 65  BILITOT 0.5 0.5 0.5 0.4 0.3  PROT 6.0* 5.8* 5.8* 5.4* 5.4*  ALBUMIN 3.1* 2.9* 3.0* 2.8* 2.9*   No results for input(s): LIPASE, AMYLASE in the last 168 hours. No results for input(s): AMMONIA in the last 168 hours. CBC: Recent Labs  Lab 06/10/19 0859 06/11/19 0527 06/12/19 0532 06/13/19 0627  06/14/19 0555  WBC 9.3 15.6* 14.2* 11.7* 10.9*  NEUTROABS 8.8* 13.1* 12.8* 9.9* 9.3*  HGB 14.0 13.7 14.0 13.8 14.6  HCT 44.0 43.0 43.1 42.1 44.8  MCV 91.5 91.1 90.0 88.8 88.2  PLT 220 237 232 208 218   Cardiac Enzymes: No results for input(s): CKTOTAL, CKMB, CKMBINDEX, TROPONINI in the last 168 hours. BNP: Invalid input(s): POCBNP CBG: Recent Labs  Lab 06/13/19 1150 06/13/19 1556 06/13/19 2024 06/14/19 0814 06/14/19 1546  GLUCAP 267* 223* 264* 328* 97   D-Dimer No results for input(s): DDIMER in the last 72 hours. Hgb A1c No results for input(s): HGBA1C in the last 72 hours. Lipid Profile No results for input(s): CHOL, HDL, LDLCALC, TRIG, CHOLHDL, LDLDIRECT in the last 72 hours. Thyroid function studies No results for input(s): TSH, T4TOTAL, T3FREE, THYROIDAB in the last 72 hours.  Invalid input(s): FREET3 Anemia work up No results for input(s): VITAMINB12, FOLATE, FERRITIN, TIBC, IRON, RETICCTPCT in the last 72 hours. Urinalysis    Component Value Date/Time   COLORURINE YELLOW (A) 11/10/2015 1709   APPEARANCEUR CLEAR (A) 11/10/2015 1709   APPEARANCEUR Clear 10/28/2013 2215   LABSPEC 1.016 11/10/2015  1709   LABSPEC 1.002 10/28/2013 2215   PHURINE 6.0 11/10/2015 1709   GLUCOSEU NEGATIVE 11/10/2015 1709   GLUCOSEU Negative 10/28/2013 2215   HGBUR NEGATIVE 11/10/2015 1709   BILIRUBINUR NEGATIVE 11/10/2015 1709   BILIRUBINUR Negative 10/28/2013 2215   KETONESUR TRACE (A) 11/10/2015 1709   PROTEINUR NEGATIVE 11/10/2015 1709   UROBILINOGEN 1.0 03/29/2009 0840   NITRITE NEGATIVE 11/10/2015 1709   LEUKOCYTESUR NEGATIVE 11/10/2015 1709   LEUKOCYTESUR Negative 10/28/2013 2215   Sepsis Labs Invalid input(s): PROCALCITONIN,  WBC,  LACTICIDVEN Microbiology Recent Results (from the past 240 hour(s))  SARS Coronavirus 2 by RT PCR (hospital order, performed in [place] hospital lab) Nasopharyngeal Nasopharyngeal Swab     Status: None   Collection Time: 06/09/19 10:09  PM   Specimen: Nasopharyngeal Swab  Result Value Ref Range Status   SARS Coronavirus 2 NEGATIVE NEGATIVE Final    Comment: (NOTE) SARS-CoV-2 target nucleic acids are NOT DETECTED. The SARS-CoV-2 RNA is generally detectable in upper and lower respiratory specimens during the acute phase of infection. The lowest concentration of SARS-CoV-2 viral copies this assay can detect is 250 copies / mL. A negative result does not preclude SARS-CoV-2 infection and should not be used as the sole basis for treatment or other patient management decisions.  A negative result may occur with improper specimen collection / handling, submission of specimen other than nasopharyngeal swab, presence of viral mutation(s) within the areas targeted by this assay, and inadequate number of viral copies (<250 copies / mL). A negative result must be combined with clinical observations, patient history, and epidemiological information. Fact Sheet for Patients:   StrictlyIdeas.no Fact Sheet for Healthcare Providers: BankingDealers.co.za This test is not yet approved or cleared  by the [place] FDA and has been authorized for detection and/or diagnosis of SARS-CoV-2 by FDA under an Emergency Use Authorization (EUA).  This EUA will remain in effect (meaning this test can be used) for the duration of the COVID-19 declaration under Section 564(b)(1) of the Act, 21 U.S.C. section 360bbb-3(b)(1), unless the authorization is terminated or revoked sooner. Performed at [facility], [address] [place], [place] [address]   MRSA PCR Screening     Status: Abnormal   Collection Time: 06/10/19  5:48 AM   Specimen: Nasal Mucosa; Nasopharyngeal  Result Value Ref Range Status   MRSA by PCR POSITIVE (A) NEGATIVE Final    Comment:        The GeneXpert MRSA Assay (FDA approved for NASAL specimens only), is one component of a comprehensive MRSA  colonization surveillance program. It is not intended to diagnose MRSA infection nor to guide or monitor treatment for MRSA infections. RESULT CALLED TO, READ BACK BY AND VERIFIED WITH: Burnice Logan RN AT [phone] ON 06/10/19 [facility] Performed at [facility], [address] [place], [place] [address]   Expectorated sputum assessment w rflx to resp cult     Status: None   Collection Time: 06/11/19  5:20 AM   Specimen: Expectorated Sputum  Result Value Ref Range Status   Specimen Description EXPECTORATED SPUTUM  Final   Special Requests NONE  Final   Sputum evaluation   Final    THIS SPECIMEN IS ACCEPTABLE FOR SPUTUM CULTURE Performed at [facility], [address] [place], [place] [address]    Report Status 06/11/2019 FINAL  Final  Culture, respiratory     Status: None   Collection Time: 06/11/19  5:20 AM  Result Value Ref Range Status  Specimen Description   Final    EXPECTORATED SPUTUM Performed at [facility], [address] [place], [place] [address]    Special Requests   Final    NONE Reflexed from [phone] Performed at [facility], [address] [place], [place] [address]    Gram Stain   Final    FEW WBC PRESENT, PREDOMINANTLY PMN MODERATE GRAM POSITIVE COCCI FEW GRAM NEGATIVE RODS Performed at [facility], [address], [place], [place] [address]    Culture FEW STENOTROPHOMONAS MALTOPHILIA  Final   Report Status 06/13/2019 FINAL  Final   Organism ID, Bacteria STENOTROPHOMONAS MALTOPHILIA  Final      Susceptibility   Stenotrophomonas maltophilia - MIC*    LEVOFLOXACIN 2 SENSITIVE Sensitive     TRIMETH/SULFA <=20 SENSITIVE Sensitive     * FEW STENOTROPHOMONAS MALTOPHILIA  Resp Panel by RT PCR (RSV, Flu A&B, Covid) - Urine, Clean Catch     Status: None   Collection Time: 06/11/19  9:03 AM   Specimen: Urine, Clean Catch  Result Value Ref Range Status   SARS Coronavirus 2 by RT PCR NEGATIVE NEGATIVE Final     Comment: (NOTE) SARS-CoV-2 target nucleic acids are NOT DETECTED. The SARS-CoV-2 RNA is generally detectable in upper respiratoy specimens during the acute phase of infection. The lowest concentration of SARS-CoV-2 viral copies this assay can detect is 131 copies/mL. A negative result does not preclude SARS-Cov-2 infection and should not be used as the sole basis for treatment or other patient management decisions. A negative result may occur with  improper specimen collection/handling, submission of specimen other than nasopharyngeal swab, presence of viral mutation(s) within the areas targeted by this assay, and inadequate number of viral copies (<131 copies/mL). A negative result must be combined with clinical observations, patient history, and epidemiological information. The expected result is Negative. Fact Sheet for Patients:  https://www.moore.com/ Fact Sheet for Healthcare Providers:  https://www.young.biz/ This test is not yet ap proved or cleared by the [place] FDA and  has been authorized for detection and/or diagnosis of SARS-CoV-2 by FDA under an Emergency Use Authorization (EUA). This EUA will remain  in effect (meaning this test can be used) for the duration of the COVID-19 declaration under Section 564(b)(1) of the Act, 21 U.S.C. section 360bbb-3(b)(1), unless the authorization is terminated or revoked sooner.    Influenza A by PCR NEGATIVE NEGATIVE Final   Influenza B by PCR NEGATIVE NEGATIVE Final    Comment: (NOTE) The Xpert Xpress SARS-CoV-2/FLU/RSV assay is intended as an aid in  the diagnosis of influenza from Nasopharyngeal swab specimens and  should not be used as a sole basis for treatment. Nasal washings and  aspirates are unacceptable for Xpert Xpress SARS-CoV-2/FLU/RSV  testing. Fact Sheet for Patients: https://www.moore.com/ Fact Sheet for Healthcare  Providers: https://www.young.biz/ This test is not yet approved or cleared by the [place] FDA and  has been authorized for detection and/or diagnosis of SARS-CoV-2 by  FDA under an Emergency Use Authorization (EUA). This EUA will remain  in effect (meaning this test can be used) for the duration of the  Covid-19 declaration under Section 564(b)(1) of the Act, 21  U.S.C. section 360bbb-3(b)(1), unless the authorization is  terminated or revoked.    Respiratory Syncytial Virus by PCR NEGATIVE NEGATIVE Final    Comment: (NOTE) Fact Sheet for Patients: https://www.moore.com/ Fact Sheet for Healthcare Providers: https://www.young.biz/ This test is not yet approved or cleared by the [place] and  has been authorized for detection and/or diagnosis of SARS-CoV-2 by  FDA under an Emergency Use Authorization (EUA). This EUA will remain  in effect (meaning this test can be used) for the duration of the  COVID-19 declaration under Section 564(b)(1) of the Act, 21 U.S.C.  section 360bbb-3(b)(1), unless the authorization is terminated or  revoked. Performed at [facility], [address], [place], [place] [address]    Time coordinating discharge: 35 minutes  SIGNED:  Merlene Laughter, DO [facility] 06/14/2019, 7:50 PM Pager is on AMION  If 7PM-7AM, please contact night-coverage [email or web address]

## 2019-06-14 NOTE — Plan of Care (Signed)
  Problem: Activity: Goal: Risk for activity intolerance will decrease Outcome: Progressing   Problem: Pain Managment: Goal: General experience of comfort will improve Outcome: Progressing   Problem: Skin Integrity: Goal: Risk for impaired skin integrity will decrease Outcome: Progressing   Problem: Activity: Goal: Ability to tolerate increased activity will improve Outcome: Progressing   Problem: Respiratory: Goal: Ability to maintain a clear airway will improve Outcome: Progressing Goal: Levels of oxygenation will improve Outcome: Progressing

## 2019-06-14 NOTE — Discharge Instructions (Signed)
Chronic Obstructive Pulmonary Disease Chronic obstructive pulmonary disease (COPD) is a long-term (chronic) lung problem. When you have COPD, it is hard for air to get in and out of your lungs. Usually the condition gets worse over time, and your lungs will never return to normal. There are things you can do to keep yourself as healthy as possible.  Your doctor may treat your condition with: ? Medicines. ? Oxygen. ? Lung surgery.  Your doctor may also recommend: ? Rehabilitation. This includes steps to make your body work better. It may involve a team of specialists. ? Quitting smoking, if you smoke. ? Exercise and changes to your diet. ? Comfort measures (palliative care). Follow these instructions at home: Medicines  Take over-the-counter and prescription medicines only as told by your doctor.  Talk to your doctor before taking any cough or allergy medicines. You may need to avoid medicines that cause your lungs to be dry. Lifestyle  If you smoke, stop. Smoking makes the problem worse. If you need help quitting, ask your doctor.  Avoid being around things that make your breathing worse. This may include smoke, chemicals, and fumes.  Stay active, but remember to rest as well.  Learn and use tips on how to relax.  Make sure you get enough sleep. Most adults need at least 7 hours of sleep every night.  Eat healthy foods. Eat smaller meals more often. Rest before meals. Controlled breathing Learn and use tips on how to control your breathing as told by your doctor. Try:  Breathing in (inhaling) through your nose for 1 second. Then, pucker your lips and breath out (exhale) through your lips for 2 seconds.  Putting one hand on your belly (abdomen). Breathe in slowly through your nose for 1 second. Your hand on your belly should move out. Pucker your lips and breathe out slowly through your lips. Your hand on your belly should move in as you breathe out.  Controlled coughing Learn  and use controlled coughing to clear mucus from your lungs. Follow these steps: 1. Lean your head a little forward. 2. Breathe in deeply. 3. Try to hold your breath for 3 seconds. 4. Keep your mouth slightly open while coughing 2 times. 5. Spit any mucus out into a tissue. 6. Rest and do the steps again 1 or 2 times as needed. General instructions  Make sure you get all the shots (vaccines) that your doctor recommends. Ask your doctor about a flu shot and a pneumonia shot.  Use oxygen therapy and pulmonary rehabilitation if told by your doctor. If you need home oxygen therapy, ask your doctor if you should buy a tool to measure your oxygen level (oximeter).  Make a COPD action plan with your doctor. This helps you to know what to do if you feel worse than usual.  Manage any other conditions you have as told by your doctor.  Avoid going outside when it is very hot, cold, or humid.  Avoid people who have a sickness you can catch (contagious).  Keep all follow-up visits as told by your doctor. This is important. Contact a doctor if:  You cough up more mucus than usual.  There is a change in the color or thickness of the mucus.  It is harder to breathe than usual.  Your breathing is faster than usual.  You have trouble sleeping.  You need to use your medicines more often than usual.  You have trouble doing your normal activities such as getting dressed   or walking around the house. Get help right away if:  You have shortness of breath while resting.  You have shortness of breath that stops you from: ? Being able to talk. ? Doing normal activities.  Your chest hurts for longer than 5 minutes.  Your skin color is more blue than usual.  Your pulse oximeter shows that you have low oxygen for longer than 5 minutes.  You have a fever.  You feel too tired to breathe normally. Summary  Chronic obstructive pulmonary disease (COPD) is a long-term lung problem.  The way your  lungs work will never return to normal. Usually the condition gets worse over time. There are things you can do to keep yourself as healthy as possible.  Take over-the-counter and prescription medicines only as told by your doctor.  If you smoke, stop. Smoking makes the problem worse. This information is not intended to replace advice given to you by your health care provider. Make sure you discuss any questions you have with your health care provider. Document Revised: 12/08/2016 Document Reviewed: 01/31/2016 Elsevier Patient Education  2020 [employer]  

## 2019-06-14 NOTE — Progress Notes (Signed)
Pulmonary Medicine          Date: 06/14/2019,   MRN# [id number] Gregory Hall [facility] [birth year]     AdmissionWeight: 96.2 kg                 CurrentWeight: 92.9 kg  Referring physician: Dr Marland Mcalpine    CHIEF COMPLAINT:   Acute exacerbation of COPD   HISTORY OF PRESENT ILLNESS   Patient clinically improved, he is working with RT.  CXR improved.  Patient is walking around hallway.  Weaning O2 to home setting.     PAST MEDICAL HISTORY   Past Medical History:  Diagnosis Date  . CAD (coronary artery disease)    s/p PTCA and stent x2  . Chest pain   . Chronic pain syndrome   . Degenerative cervical disc   . Dyslipidemia   . GERD (gastroesophageal reflux disease)   . Hernia 2014  . Hypertension   . MRSA (methicillin resistant staph aureus) culture positive 2011  . Neuropathy   . Nutcracker esophagus   . Rectus diastasis 07/19/2012     SURGICAL HISTORY   Past Surgical History:  Procedure Laterality Date  . BACK SURGERY  2012  . CHOLECYSTECTOMY    . COLONOSCOPY  Jan 2014   [name]  . CORONARY ANGIOPLASTY WITH STENT PLACEMENT  2009   stents x2, in [place], [place]  . FOOT SURGERY     Right  . NECK SURGERY    . SPINE SURGERY  2012,2013  . TONSILLECTOMY       FAMILY HISTORY   Family History  Family history unknown: Yes     SOCIAL HISTORY   Social History   Tobacco Use  . Smoking status: Current Every Day Smoker    Packs/day: 0.50    Years: 30.00    Pack years: 15.00  . Smokeless tobacco: Never Used  Substance Use Topics  . Alcohol use: No  . Drug use: No     MEDICATIONS    Home Medication:    Current Medication:  Current Facility-Administered Medications:  .  0.9 %  sodium chloride infusion, , Intravenous, PRN, Drema Dallas, MD, Stopped at 06/12/19 1704 .  acetylcysteine (MUCOMYST) 20 % nebulizer / oral solution 4 mL, 4 mL, Nebulization, BID, Karna Christmas, Merlin Golden, MD, 4 mL at 06/13/19 2126 .  albuterol (PROVENTIL) (2.5 MG/3ML)  0.083% nebulizer solution 2.5 mg, 2.5 mg, Nebulization, Q2H PRN, Andris Baumann, MD .  arformoterol [facility]) nebulizer solution 15 mcg, 15 mcg, Nebulization, BID, Marguerita Merles [place], [place], 15 mcg at 06/13/19 2127 .  aspirin EC tablet 81 mg, 81 mg, Oral, Daily, Drema Dallas, MD, 81 mg at 06/13/19 0816 .  atorvastatin (LIPITOR) tablet 80 mg, 80 mg, Oral, QHS, Drema Dallas, MD, 80 mg at 06/13/19 2016 .  budesonide (PULMICORT) nebulizer solution 0.25 mg, 0.25 mg, Nebulization, BID, Marguerita Merles Latif, DO, 0.25 mg at 06/14/19 2229 .  busPIRone (BUSPAR) tablet 10 mg, 10 mg, Oral, BID, Andris Baumann, MD, 10 mg at 06/13/19 2016 .  Chlorhexidine Gluconate Cloth 2 % PADS 6 each, 6 each, Topical, Q0600, Drema Dallas, MD, 6 each at 06/13/19 [phone] .  cholecalciferol (VITAMIN D3) tablet 2,000 Units, 2,000 Units, Oral, Daily, Marguerita Merles [place], [place], 2,000 Units at 06/13/19 0815 .  DULoxetine (CYMBALTA) DR capsule 60 mg, 60 mg, Oral, Daily, Drema Dallas, MD, 60 mg at 06/13/19 0818 .  furosemide (LASIX) tablet 20 mg, 20 mg, Oral, Daily PRN, Marland Mcalpine,  Heloise [place]am, DO .  guaiFENesin (MUCINEX) 12 hr tablet 1,200 mg, 1,200 mg, Oral, BID, Marguerita Merles [place], DO, 1,200 mg at 06/13/19 2016 .  insulin aspart (novoLOG) injection 0-15 Units, 0-15 Units, Subcutaneous, TID WC, Marguerita Merles [place], [place], 5 Units at 06/13/19 1732 .  insulin aspart (novoLOG) injection 0-5 Units, 0-5 Units, Subcutaneous, QHS, Marguerita Merles [place], [place], 3 Units at 06/13/19 2027 .  insulin aspart (novoLOG) injection 7 Units, 7 Units, Subcutaneous, TID WC, Marguerita Merles [place], [place], 7 Units at 06/13/19 1732 .  insulin glargine (LANTUS) injection 26 Units, 26 Units, Subcutaneous, QHS, Drema Dallas, MD, 26 Units at 06/13/19 2027 .  ipratropium-albuterol (DUONEB) 0.5-2.5 (3) MG/3ML nebulizer solution 3 mL, 3 mL, Nebulization, Q6H, Andris Baumann, MD, 3 mL at 06/14/19 0821 .  lamoTRIgine (LAMICTAL) tablet 100 mg, 100 mg, Oral, Daily, Drema Dallas, MD, 100 mg at 06/13/19 0816 .  methylPREDNISolone sodium succinate (SOLU-MEDROL) 40 mg/mL injection 40 mg, 40 mg, Intravenous, Q12H, Marguerita Merles [place], DO, 40 mg at 06/14/19 0014 .  metoprolol succinate (TOPROL-XL) 24 hr tablet 25 mg, 25 mg, Oral, Daily, Lindajo Royal V, MD, 25 mg at 06/13/19 0818 .  mupirocin ointment (BACTROBAN) 2 % 1 application, 1 application, Nasal, BID, Drema Dallas, MD, 1 application at 06/13/19 2022 .  nicotine (NICODERM CQ - dosed in mg/24 hours) patch 21 mg, 21 mg, Transdermal, Daily, Drema Dallas, MD, 21 mg at 06/13/19 2018 .  nitroGLYCERIN (NITROSTAT) SL tablet 0.4 mg, 0.4 mg, Sublingual, Q5 min PRN, Lindajo Royal V, MD .  ondansetron [facility]) injection 4 mg, 4 mg, Intravenous, Q6H PRN, Drema Dallas, MD, 4 mg at 06/11/19 0856 .  ondansetron (ZOFRAN) tablet 4 mg, 4 mg, Oral, Daily PRN, Marland Mcalpine, Omair Latif, DO .  oxyCODONE (Oxy IR/ROXICODONE) immediate release tablet 10 mg, 10 mg, Oral, Q4H PRN, Marguerita Merles Latif, DO, 10 mg at 06/14/19 0932 .  oxymetazoline (AFRIN) 0.05 % nasal spray 2 spray, 2 spray, Each Nare, BID PRN, Sheikh, Omair Latif, DO .  pantoprazole (PROTONIX) EC tablet 40 mg, 40 mg, Oral, Daily, Marguerita Merles [place], DO, 40 mg at 06/13/19 0817 .  pregabalin (LYRICA) capsule 200 mg, 200 mg, Oral, TID, Drema Dallas, MD, 200 mg at 06/13/19 2015 .  protein supplement (ENSURE MAX) liquid, 11 oz, Oral, BID BM, Marguerita Merles [place], DO, 11 oz at 06/13/19 1233 .  rivaroxaban (XARELTO) tablet 20 mg, 20 mg, Oral, Q supper, Drema Dallas, MD, 20 mg at 06/13/19 1616 .  roflumilast (DALIRESP) tablet 250 mcg, 250 mcg, Oral, Daily, Vida Rigger, MD, 250 mcg at 06/13/19 0819 .  sulfamethoxazole-trimethoprim (BACTRIM) 464.48 mg in dextrose 5 % 500 mL IVPB, 15 mg/kg/day, Intravenous, Q8H, Sheikh, Omair [place], DO, Last Rate: 352.7 mL/hr at 06/14/19 0524, 464.48 mg at 06/14/19 0524 .  traZODone (DESYREL) tablet 100 mg, 100 mg, Oral, QHS PRN, Marguerita Merles Latif, DO, 100 mg at 06/14/19 0203    ALLERGIES   Acetaminophen and Nsaids     REVIEW OF SYSTEMS    Review of Systems:  Gen:  Denies  fever, sweats, chills weigh loss  HEENT: Denies blurred vision, double vision, ear pain, eye pain, hearing loss, nose bleeds, sore throat Cardiac:  No dizziness, chest pain or heaviness, chest tightness,edema Resp: Reports shortness of breath,wheezing, denies hemoptysis,  Gi: Denies swallowing difficulty, stomach pain, nausea or vomiting, diarrhea, constipation, bowel incontinence Gu:  Denies bladder incontinence, burning urine Ext:   Denies Joint pain,  stiffness or swelling Skin: Denies  skin rash, easy bruising or bleeding or hives Endoc:  Denies polyuria, polydipsia , polyphagia or weight change Psych:   Denies depression, insomnia or hallucinations   Other:  All other systems negative   VS: BP (!) 147/79 (BP Location: Right Arm)   Pulse 69   Temp 98.4 F (36.9 C) (Oral)   Resp 16   Ht 6' (1.829 m)   Wt 92.9 kg   SpO2 93%   BMI 27.78 kg/m      PHYSICAL EXAM    GENERAL:NAD, no fevers, chills, no weakness no fatigue HEAD: Normocephalic, atraumatic.  EYES: Pupils equal, round, reactive to light. Extraocular muscles intact. No scleral icterus.  MOUTH: Moist mucosal membrane. Dentition intact. No abscess noted.  EAR, NOSE, THROAT: Clear without exudates. No external lesions.  NECK: Supple. No thyromegaly. No nodules. No JVD.  PULMONARY: Mildly rhonchorous breath sounds bilaterally CARDIOVASCULAR: S1 and S2. Regular rate and rhythm. No murmurs, rubs, or gallops. No edema. Pedal pulses 2+ bilaterally.  GASTROINTESTINAL: Soft, nontender, nondistended. No masses. Positive bowel sounds. No hepatosplenomegaly.  MUSCULOSKELETAL: No swelling, clubbing, or edema. Range of motion full in all extremities.  NEUROLOGIC: Cranial nerves II through XII are intact. No gross focal neurological deficits. Sensation intact. Reflexes intact.   SKIN: No ulceration, lesions, rashes, or cyanosis. Skin warm and dry. Turgor intact.  PSYCHIATRIC: Mood, affect within normal limits. The patient is awake, alert and oriented x 3. Insight, judgment intact.       IMAGING    DG Chest 1 View  Result Date: 06/14/2019 CLINICAL DATA:  Shortness of breath EXAM: CHEST  1 VIEW COMPARISON:  Yesterday FINDINGS: Streaky opacity at both lung bases, mildly improved on the right. Hyperinflation and apical emphysema. Normal heart size and mediastinal contours. ACDF, left subclavian stenting, and spinal stimulators. IMPRESSION: 1. Mild improvement in atelectasis. 2. COPD. Electronically Signed   By: Marnee Spring M.D.   On: 06/14/2019 07:50   DG Chest 1 View  Result Date: 06/13/2019 CLINICAL DATA:  Shortness of breath EXAM: CHEST  1 VIEW COMPARISON:  06/12/2019 FINDINGS: Heart size within normal limits. Atherosclerotic calcification of the aortic knob. Vascular stent again noted at the superior mediastinum. Stable bandlike opacities within the bilateral lung bases. No new focal airspace consolidation. No pleural effusion or pneumothorax. Partially visualized lower cervical ACDF hardware. Spinal stimulator leads within the lower thoracic spine. IMPRESSION: Stable bandlike opacities within the bilateral lung bases. No new focal airspace consolidation. Electronically Signed   By: Duanne Guess D.O.   On: 06/13/2019 08:07   DG Chest 2 View  Result Date: 05/25/2019 CLINICAL DATA:  Wheezing. Status post fall EXAM: CHEST - 2 VIEW COMPARISON:  10/07/2015 FINDINGS: Signs of stenting along the LEFT superior mediastinal border, evidence of cervical spinal fusion and with nerve stimulator in place projecting over midthoracic spine. Cardiomediastinal contours are normal. Hilar structures with stable appearance. Hyperinflation without consolidation or sign of pleural effusion. Visualized skeletal structures are unremarkable. IMPRESSION: No active cardiopulmonary disease.  Signs of COPD. Electronically Signed   By: Donzetta Kohut M.D.   On: 05/25/2019 17:06   CT ANGIO CHEST PE W OR WO CONTRAST  Result Date: 06/12/2019 CLINICAL DATA:  COPD exacerbation, wheezing EXAM: CT ANGIOGRAPHY CHEST WITH CONTRAST TECHNIQUE: Multidetector CT imaging of the chest was performed using the standard protocol during bolus administration of intravenous contrast. Multiplanar CT image reconstructions and MIPs were obtained to evaluate the vascular anatomy. CONTRAST:  71mL OMNIPAQUE IOHEXOL 350  MG/ML SOLN COMPARISON:  03/30/2013 FINDINGS: Cardiovascular: Satisfactory opacification of the pulmonary arteries to the segmental level. No evidence of pulmonary embolism. Normal heart size. Three-vessel coronary artery calcifications. No pericardial effusion. Aortic atherosclerosis. Left subclavian artery stent. Mediastinum/Nodes: No enlarged mediastinal, hilar, or axillary lymph nodes. Thyroid gland, trachea, and esophagus demonstrate no significant findings. Lungs/Pleura: Moderate predominantly centrilobular emphysema. Diffuse bilateral bronchial wall thickening. Generally bandlike dependent bibasilar atelectasis or consolidations. Upper Abdomen: No acute abnormality. Musculoskeletal: No chest wall abnormality. No acute or significant osseous findings. Review of the MIP images confirms the above findings. IMPRESSION: 1. Negative examination for pulmonary embolism. 2. Generally bandlike dependent bibasilar atelectasis or consolidations, which may reflect bland atelectasis or alternately infection or aspiration. 3. Diffuse bilateral bronchial wall thickening, consistent with nonspecific infectious or inflammatory bronchitis. 4. Emphysema (ICD10-J43.9). 5. Coronary artery disease. Aortic Atherosclerosis (ICD10-I70.0). Electronically Signed   By: Lauralyn Primes M.D.   On: 06/12/2019 11:19   CT Lumbar Spine Wo Contrast  Result Date: 05/25/2019 CLINICAL DATA:  Fall. Low back pain. EXAM: CT LUMBAR SPINE WITHOUT  CONTRAST TECHNIQUE: Multidetector CT imaging of the lumbar spine was performed without intravenous contrast administration. Multiplanar CT image reconstructions were also generated. COMPARISON:  04/07/2019 FINDINGS: Segmentation: 5 lumbar type vertebrae. Alignment: Unchanged grade 1 anterolisthesis of L4 on L5. Vertebrae: The L1 superior endplate compression fracture on the prior CT demonstrates progressive, mild vertebral body height loss and sclerosis along the fracture without retropulsion or posterior element involvement. No acute lumbar spine fracture is identified. Nondisplaced bilateral sacral fractures also demonstrate new sclerosis/healing and are again noted to extend transversely across the anterior S2 segment. Sequelae of L4-5 posterior and interbody fusion are again identified with unchanged hardware positioning and no evidence of screw loosening. Paraspinal and other soft tissues: Abdominal aortic atherosclerosis without aneurysm. Postsurgical changes in the posterior lumbar soft tissues. Partially visualized thoracic spinal cord stimulator. Disc levels: T12-L1 through L2-3: No significant findings. L3-4: Mild disc space narrowing. Right eccentric disc bulging and facet and ligamentum flavum hypertrophy result in mild-to-moderate spinal stenosis and mild-to-moderate right neural foraminal stenosis, unchanged. L4-5: Previous wide posterior decompression and fusion. Limited assessment of the spinal canal due to streak artifact. No significant stenosis identified. L5-S1: Moderate right greater than left facet arthrosis without stenosis, unchanged. IMPRESSION: 1. Subacute L1 compression fracture with mildly progressive height loss. 2. Healing bilateral sacral fractures. 3. No new fracture identified. 4. Unchanged disc and facet degeneration at L3-4 with mild-to-moderate spinal stenosis. 5. Aortic Atherosclerosis (ICD10-I70.0). Electronically Signed   By: Sebastian Ache M.D.   On: 05/25/2019 17:25   DG  Chest Port 1 View  Result Date: 06/12/2019 CLINICAL DATA:  Dyspnea EXAM: PORTABLE CHEST 1 VIEW COMPARISON:  06/09/2019 FINDINGS: Cardiac shadows within normal limits. Aortic calcifications are noted. Stimulator is noted over the midthoracic spine. Stable bibasilar scarring is noted. No new focal infiltrate is seen. Postsurgical changes in the cervical spine are noted. IMPRESSION: Stable bibasilar scarring.  No other focal abnormality is noted. Electronically Signed   By: Alcide Clever M.D.   On: 06/12/2019 01:26   DG Chest Port 1 View  Result Date: 06/09/2019 CLINICAL DATA:  Dyspnea EXAM: PORTABLE CHEST 1 VIEW COMPARISON:  05/25/2019 FINDINGS: Cardiac shadow is within normal limits. Aortic calcifications are noted. Spinal stimulator and left subclavian arterial stent are noted. Lungs are hyperinflated consistent with COPD. Bibasilar scarring is noted. No acute infiltrate is noted. IMPRESSION: COPD and bibasilar scarring.  No other focal abnormality is noted. Electronically Signed  By: Inez Catalina M.D.   On: 06/09/2019 21:37         ASSESSMENT/PLAN   Acute on chronic hypoxemic respiratory failure -Due to moderate acute exacerbation of COPD -Complicated by bibasilar atelectasis-Improved bilaterally  -Agree with zithromax and rocephin for empiric CAP coverage-agree with change to bactrim due to stenotrophomonas + resp cx- mild leukocytosis from demargination and PCT is normal so this may just be colonizer but since patient is symptomatic we can continue 5-7d course.  -will decrease Solu-Medrol from 60-40 3 times daily>>40q12h>>40 daily >>pred burst with taper -Agree with Pulmicort and Brovana -continue MetaNeb utilizing saline every 4 hours -Mucomyst 4 mL 20% twice daily -PT OT when able -I-S at bedside encouraged to use multiple times each hour -Acapella device use as able several times daily -Roflumilast 250 MCG p.o. daily   Thank you for allowing me to participate in the care of this  patient.   Patient/Family are satisfied with care plan and all questions have been answered.   This document was prepared using Dragon voice recognition software and may include unintentional dictation errors.     Ottie Glazier, M.D.  Division of [place]

## 2019-06-14 NOTE — Progress Notes (Signed)
Patient is being discharged to home with [facility] this afternoon. DC & Rx instructions given and patient acknowledged understanding. IV removed and belongings packed. Cab called to pick up patient.

## 2019-06-14 NOTE — TOC Transition Note (Signed)
Transition of Care [facility]) - CM/SW Discharge Note   Patient Details  Name: Gregory Hall MRN: [id number] Date of Birth: 01/21/1955  Transition of Care [facility] CM/SW Contact:  Maud Deed, LCSW Phone Number: 06/14/2019, 2:23 PM   Clinical Narrative:    Pt medically stable for discharge per MD. CSW was made aware that pt needed portable oxygen tank to go home with. Pt is already established with [facility]. CSW contacted Morrie Sheldon with [employer] to arrange Oxygen delivery. Pt will be transported back to [facility] via [facility]. Taxi voucher proivided.   Final next level of care: Assisted Living Barriers to Discharge: No Barriers Identified   Patient Goals and CMS Choice Patient states their goals for this hospitalization and ongoing recovery are:: go back to [facility] home      Discharge Placement                Patient to be transferred to facility by: Taxi Name of family member notified: Terri Patient and family notified of of transfer: 06/14/19  Discharge Plan and Services   Discharge Planning Services: CM Consult            DME Arranged: N/A DME Agency: Patsy Lager Date DME Agency Contacted: 06/14/19 Time DME Agency Contacted: 0130 Representative spoke with at DME Agency: Morrie Sheldon HH Arranged: Refused HH          Social Determinants of Health (SDOH) Interventions     Readmission Risk Interventions No flowsheet data found.

## 2019-06-17 LAB — GLUCOSE, CAPILLARY: Glucose-Capillary: 219 mg/dL — ABNORMAL HIGH (ref 70–99)

## 2019-09-12 ENCOUNTER — Inpatient Hospital Stay
Admission: EM | Admit: 2019-09-12 | Discharge: 2019-09-14 | DRG: 280 | Disposition: A | Discharge status: Home / Self Care | Payer: Medicare Other | Attending: Family Medicine | Admitting: Family Medicine

## 2019-09-12 ENCOUNTER — Emergency Department: Payer: Medicare Other

## 2019-09-12 ENCOUNTER — Other Ambulatory Visit: Payer: Self-pay

## 2019-09-12 DIAGNOSIS — I110 Hypertensive heart disease with heart failure: Principal | ICD-10-CM | POA: Diagnosis present

## 2019-09-12 DIAGNOSIS — K219 Gastro-esophageal reflux disease without esophagitis: Secondary | ICD-10-CM | POA: Diagnosis present

## 2019-09-12 DIAGNOSIS — I5031 Acute diastolic (congestive) heart failure: Secondary | ICD-10-CM

## 2019-09-12 DIAGNOSIS — Z9981 Dependence on supplemental oxygen: Secondary | ICD-10-CM

## 2019-09-12 DIAGNOSIS — E785 Hyperlipidemia, unspecified: Secondary | ICD-10-CM | POA: Diagnosis present

## 2019-09-12 DIAGNOSIS — I2510 Atherosclerotic heart disease of native coronary artery without angina pectoris: Secondary | ICD-10-CM | POA: Diagnosis present

## 2019-09-12 DIAGNOSIS — J9622 Acute and chronic respiratory failure with hypercapnia: Secondary | ICD-10-CM | POA: Diagnosis present

## 2019-09-12 DIAGNOSIS — N179 Acute kidney failure, unspecified: Secondary | ICD-10-CM | POA: Diagnosis present

## 2019-09-12 DIAGNOSIS — Z79899 Other long term (current) drug therapy: Secondary | ICD-10-CM

## 2019-09-12 DIAGNOSIS — D6859 Other primary thrombophilia: Secondary | ICD-10-CM | POA: Diagnosis present

## 2019-09-12 DIAGNOSIS — Z9049 Acquired absence of other specified parts of digestive tract: Secondary | ICD-10-CM | POA: Diagnosis not present

## 2019-09-12 DIAGNOSIS — Z7982 Long term (current) use of aspirin: Secondary | ICD-10-CM

## 2019-09-12 DIAGNOSIS — Z955 Presence of coronary angioplasty implant and graft: Secondary | ICD-10-CM

## 2019-09-12 DIAGNOSIS — F17210 Nicotine dependence, cigarettes, uncomplicated: Secondary | ICD-10-CM | POA: Diagnosis present

## 2019-09-12 DIAGNOSIS — J441 Chronic obstructive pulmonary disease with (acute) exacerbation: Secondary | ICD-10-CM | POA: Diagnosis present

## 2019-09-12 DIAGNOSIS — I214 Non-ST elevation (NSTEMI) myocardial infarction: Secondary | ICD-10-CM

## 2019-09-12 DIAGNOSIS — Z20822 Contact with and (suspected) exposure to covid-19: Secondary | ICD-10-CM | POA: Diagnosis present

## 2019-09-12 DIAGNOSIS — I5033 Acute on chronic diastolic (congestive) heart failure: Secondary | ICD-10-CM | POA: Diagnosis present

## 2019-09-12 DIAGNOSIS — G894 Chronic pain syndrome: Secondary | ICD-10-CM | POA: Diagnosis present

## 2019-09-12 DIAGNOSIS — J9602 Acute respiratory failure with hypercapnia: Secondary | ICD-10-CM

## 2019-09-12 DIAGNOSIS — F329 Major depressive disorder, single episode, unspecified: Secondary | ICD-10-CM | POA: Diagnosis present

## 2019-09-12 DIAGNOSIS — Z7901 Long term (current) use of anticoagulants: Secondary | ICD-10-CM

## 2019-09-12 DIAGNOSIS — J9621 Acute and chronic respiratory failure with hypoxia: Secondary | ICD-10-CM | POA: Diagnosis present

## 2019-09-12 DIAGNOSIS — E1165 Type 2 diabetes mellitus with hyperglycemia: Secondary | ICD-10-CM | POA: Diagnosis present

## 2019-09-12 DIAGNOSIS — Z794 Long term (current) use of insulin: Secondary | ICD-10-CM

## 2019-09-12 DIAGNOSIS — Z8614 Personal history of Methicillin resistant Staphylococcus aureus infection: Secondary | ICD-10-CM | POA: Diagnosis not present

## 2019-09-12 DIAGNOSIS — E872 Acidosis: Secondary | ICD-10-CM | POA: Diagnosis present

## 2019-09-12 DIAGNOSIS — I10 Essential (primary) hypertension: Secondary | ICD-10-CM | POA: Diagnosis present

## 2019-09-12 DIAGNOSIS — IMO0002 Reserved for concepts with insufficient information to code with codable children: Secondary | ICD-10-CM | POA: Diagnosis present

## 2019-09-12 DIAGNOSIS — R0603 Acute respiratory distress: Secondary | ICD-10-CM

## 2019-09-12 DIAGNOSIS — R609 Edema, unspecified: Secondary | ICD-10-CM | POA: Diagnosis present

## 2019-09-12 DIAGNOSIS — Z7952 Long term (current) use of systemic steroids: Secondary | ICD-10-CM

## 2019-09-12 HISTORY — DX: Depression, unspecified: F32.A

## 2019-09-12 HISTORY — DX: Chronic obstructive pulmonary disease, unspecified: J44.9

## 2019-09-12 HISTORY — DX: Type 2 diabetes mellitus without complications: E11.9

## 2019-09-12 HISTORY — DX: Acute kidney failure, unspecified: N17.9

## 2019-09-12 LAB — BLOOD GAS, ARTERIAL
Acid-Base Excess: 7.4 mmol/L — ABNORMAL HIGH (ref 0.0–2.0)
Bicarbonate: 36.5 mmol/L — ABNORMAL HIGH (ref 20.0–28.0)
FIO2: 0.4
O2 Saturation: 98.3 %
Patient temperature: 37
pCO2 arterial: 76 mmHg (ref 32.0–48.0)
pH, Arterial: 7.29 — ABNORMAL LOW (ref 7.350–7.450)
pO2, Arterial: 121 mmHg — ABNORMAL HIGH (ref 83.0–108.0)

## 2019-09-12 LAB — CBC WITH DIFFERENTIAL/PLATELET
Abs Immature Granulocytes: 0.02 10*3/uL (ref 0.00–0.07)
Basophils Absolute: 0 10*3/uL (ref 0.0–0.1)
Basophils Relative: 0 %
Eosinophils Absolute: 0.1 10*3/uL (ref 0.0–0.5)
Eosinophils Relative: 1 %
HCT: 38.2 % — ABNORMAL LOW (ref 39.0–52.0)
Hemoglobin: 12.3 g/dL — ABNORMAL LOW (ref 13.0–17.0)
Immature Granulocytes: 0 %
Lymphocytes Relative: 18 %
Lymphs Abs: 1.5 10*3/uL (ref 0.7–4.0)
MCH: 28.9 pg (ref 26.0–34.0)
MCHC: 32.2 g/dL (ref 30.0–36.0)
MCV: 89.9 fL (ref 80.0–100.0)
Monocytes Absolute: 0.9 10*3/uL (ref 0.1–1.0)
Monocytes Relative: 11 %
Neutro Abs: 5.6 10*3/uL (ref 1.7–7.7)
Neutrophils Relative %: 70 %
Platelets: 258 10*3/uL (ref 150–400)
RBC: 4.25 MIL/uL (ref 4.22–5.81)
RDW: 16.6 % — ABNORMAL HIGH (ref 11.5–15.5)
WBC: 8.1 10*3/uL (ref 4.0–10.5)
nRBC: 0 % (ref 0.0–0.2)

## 2019-09-12 LAB — COMPREHENSIVE METABOLIC PANEL
ALT: 16 U/L (ref 0–44)
AST: 18 U/L (ref 15–41)
Albumin: 3.5 g/dL (ref 3.5–5.0)
Alkaline Phosphatase: 92 U/L (ref 38–126)
Anion gap: 13 (ref 5–15)
BUN: 25 mg/dL — ABNORMAL HIGH (ref 8–23)
CO2: 33 mmol/L — ABNORMAL HIGH (ref 22–32)
Calcium: 8.4 mg/dL — ABNORMAL LOW (ref 8.9–10.3)
Chloride: 87 mmol/L — ABNORMAL LOW (ref 98–111)
Creatinine, Ser: 1.61 mg/dL — ABNORMAL HIGH (ref 0.61–1.24)
GFR calc Af Amer: 51 mL/min — ABNORMAL LOW (ref >60)
GFR calc non Af Amer: 44 mL/min — ABNORMAL LOW (ref >60)
Glucose, Bld: 103 mg/dL — ABNORMAL HIGH (ref 70–99)
Potassium: 4.8 mmol/L (ref 3.5–5.1)
Sodium: 133 mmol/L — ABNORMAL LOW (ref 135–145)
Total Bilirubin: 0.7 mg/dL (ref 0.3–1.2)
Total Protein: 6.5 g/dL (ref 6.5–8.1)

## 2019-09-12 LAB — MAGNESIUM: Magnesium: 1.6 mg/dL — ABNORMAL LOW (ref 1.7–2.4)

## 2019-09-12 LAB — TROPONIN I (HIGH SENSITIVITY): Troponin I (High Sensitivity): 78 ng/L — ABNORMAL HIGH (ref <18)

## 2019-09-12 LAB — LACTIC ACID, PLASMA: Lactic Acid, Venous: 1.3 mmol/L (ref 0.5–1.9)

## 2019-09-12 LAB — SARS CORONAVIRUS 2 BY RT PCR (HOSPITAL ORDER, PERFORMED IN ~~LOC~~ HOSPITAL LAB): SARS Coronavirus 2: NEGATIVE

## 2019-09-12 LAB — BRAIN NATRIURETIC PEPTIDE: B Natriuretic Peptide: 458.1 pg/mL — ABNORMAL HIGH (ref 0.0–100.0)

## 2019-09-12 MED ORDER — SODIUM CHLORIDE 0.9% FLUSH: 3.0000 mL | INTRAVENOUS | Status: DC | PRN

## 2019-09-12 MED ORDER — IPRATROPIUM BROMIDE 0.02 % IN SOLN
0.5000 mg | Freq: Once | RESPIRATORY_TRACT | Status: AC
  Administered 2019-09-12: 0.5 mg via RESPIRATORY_TRACT
  Filled 2019-09-12: qty 2.5

## 2019-09-12 MED ORDER — ONDANSETRON HCL 4 MG/2ML IJ SOLN: 4.0000 mg | Freq: Four times a day (QID) | INTRAMUSCULAR | Status: DC | PRN

## 2019-09-12 MED ORDER — ASPIRIN EC 81 MG PO TBEC
81.0000 mg | DELAYED_RELEASE_TABLET | Freq: Every day | ORAL | Status: DC
  Administered 2019-09-13 – 2019-09-14 (×2): 81 mg via ORAL
  Filled 2019-09-12 (×2): qty 1

## 2019-09-12 MED ORDER — ROFLUMILAST 500 MCG PO TABS
250.0000 ug | ORAL_TABLET | Freq: Every day | ORAL | Status: DC
  Administered 2019-09-13 – 2019-09-14 (×2): 250 ug via ORAL
  Filled 2019-09-12 (×2): qty 1

## 2019-09-12 MED ORDER — FLUTICASONE-UMECLIDIN-VILANT 100-62.5-25 MCG/INH IN AEPB: 1.0000 | INHALATION_SPRAY | Freq: Every day | RESPIRATORY_TRACT | Status: DC

## 2019-09-12 MED ORDER — ATORVASTATIN CALCIUM 20 MG PO TABS
80.0000 mg | ORAL_TABLET | Freq: Every day | ORAL | Status: DC
  Administered 2019-09-13: 80 mg via ORAL
  Filled 2019-09-12: qty 4

## 2019-09-12 MED ORDER — METOPROLOL TARTRATE 25 MG PO TABS
12.5000 mg | ORAL_TABLET | Freq: Two times a day (BID) | ORAL | Status: DC
  Administered 2019-09-13 – 2019-09-14 (×3): 12.5 mg via ORAL
  Filled 2019-09-12 (×3): qty 1

## 2019-09-12 MED ORDER — INSULIN ASPART 100 UNIT/ML ~~LOC~~ SOLN
0.0000 [IU] | Freq: Three times a day (TID) | SUBCUTANEOUS | Status: DC
  Administered 2019-09-13 – 2019-09-14 (×3): 2 [IU] via SUBCUTANEOUS
  Filled 2019-09-12 (×3): qty 1

## 2019-09-12 MED ORDER — SODIUM CHLORIDE 0.9 % IV SOLN
100.0000 mg | Freq: Two times a day (BID) | INTRAVENOUS | Status: DC
  Administered 2019-09-13: 100 mg via INTRAVENOUS
  Filled 2019-09-12 (×3): qty 100

## 2019-09-12 MED ORDER — SODIUM CHLORIDE 0.9 % IV SOLN: 250.0000 mL | INTRAVENOUS | Status: DC | PRN

## 2019-09-12 MED ORDER — RIVAROXABAN 20 MG PO TABS
20.0000 mg | ORAL_TABLET | Freq: Every day | ORAL | Status: DC
  Filled 2019-09-12 (×2): qty 1

## 2019-09-12 MED ORDER — SACUBITRIL-VALSARTAN 24-26 MG PO TABS
1.0000 | ORAL_TABLET | Freq: Two times a day (BID) | ORAL | Status: DC
  Administered 2019-09-13 – 2019-09-14 (×2): 1 via ORAL
  Filled 2019-09-12 (×3): qty 1

## 2019-09-12 MED ORDER — POTASSIUM CHLORIDE CRYS ER 20 MEQ PO TBCR
20.0000 meq | EXTENDED_RELEASE_TABLET | Freq: Every day | ORAL | Status: DC
  Administered 2019-09-13 – 2019-09-14 (×2): 20 meq via ORAL
  Filled 2019-09-12 (×2): qty 1

## 2019-09-12 MED ORDER — LAMOTRIGINE 100 MG PO TABS
100.0000 mg | ORAL_TABLET | Freq: Every day | ORAL | Status: DC
  Administered 2019-09-13 – 2019-09-14 (×2): 100 mg via ORAL
  Filled 2019-09-12 (×2): qty 1

## 2019-09-12 MED ORDER — IPRATROPIUM-ALBUTEROL 0.5-2.5 (3) MG/3ML IN SOLN: 3.0000 mL | Freq: Four times a day (QID) | RESPIRATORY_TRACT | Status: DC | PRN

## 2019-09-12 MED ORDER — SODIUM CHLORIDE 0.9% FLUSH
3.0000 mL | Freq: Two times a day (BID) | INTRAVENOUS | Status: DC
  Administered 2019-09-13 – 2019-09-14 (×3): 3 mL via INTRAVENOUS

## 2019-09-12 MED ORDER — INSULIN ASPART 100 UNIT/ML ~~LOC~~ SOLN: 0.0000 [IU] | Freq: Every day | SUBCUTANEOUS | Status: DC

## 2019-09-12 MED ORDER — ALBUTEROL SULFATE (2.5 MG/3ML) 0.083% IN NEBU
10.0000 mg | INHALATION_SOLUTION | Freq: Once | RESPIRATORY_TRACT | Status: AC
  Administered 2019-09-12: 10 mg via RESPIRATORY_TRACT
  Filled 2019-09-12: qty 12

## 2019-09-12 MED ORDER — FUROSEMIDE 10 MG/ML IJ SOLN
40.0000 mg | Freq: Two times a day (BID) | INTRAMUSCULAR | Status: DC
  Administered 2019-09-13 – 2019-09-14 (×3): 40 mg via INTRAVENOUS
  Filled 2019-09-12 (×3): qty 4

## 2019-09-12 MED ORDER — FUROSEMIDE 10 MG/ML IJ SOLN
40.0000 mg | Freq: Once | INTRAMUSCULAR | Status: AC
  Administered 2019-09-12: 40 mg via INTRAVENOUS
  Filled 2019-09-12: qty 4

## 2019-09-12 NOTE — ED Notes (Signed)
Pt placed on bipap  

## 2019-09-12 NOTE — H&P (Signed)
History and Physical    Gregory Hall NOM:[id number] DOB: June 23, 1955 DOA: 09/12/2019  PCP: Armando Gang, FNP   Patient coming from:  SNF  Chief Complaint: SOB  HPI: Chamberlain Markson is a 64 y.o. male with medical history significant for CAD status post stenting, HTN, HLD, DM on insulin, COPD on 2 L home O2. He presents by EMS from a local SNF due to respiratory distress and decreased responsiveness.  Reportedly patient was found to be somnolent at the skilled facility so EMS was called and he was found to be hypoxic with O2 sats in the 50s on room air when EMS initially evaluated him.  He was placed on some oxygen and given 2 DuoNeb treatments in route to the hospital.  Patient required 6 L of oxygen during transport from his baseline of 2 L/min.  In the emergency room patient was placed on BiPAP.  Patient is somnolent and will only mumble incoherently when aroused.  Patient unable to give any history himself.  There is no report of any fever.  When patient is aroused he is asked if he has any chest pain he shakes his head no.  There is no report of any recent illness.  Reportedly he is fully vaccinated against COVID-19 per SNF paperwork.   ED Course: In the emergency room ABG was obtained which showed mild acidosis and hypoxia with hypercapnia.  Placed on BiPAP which he is tolerating well.  Is still somnolent but more arousable.  Will only stay awake for a few seconds before closing his eyes again after being aroused and asked a question.  He will give answers to simple questions that are yes or no answers.  He has significant edema of his legs.  When he is asked that he normally is this edematous he says no.   Review of Systems:  Patient is unable to give accurate review of system secondary to acuity of medical condition  Past Medical History:  Diagnosis Date  . AKI (acute kidney injury) (HCC) 09/12/2019  . CAD (coronary artery disease)    s/p PTCA and stent x2  . Chest pain    . Chronic pain syndrome   . COPD (chronic obstructive pulmonary disease) (HCC)   . Degenerative cervical disc   . Depression   . Diabetes mellitus without complication (HCC)   . Dyslipidemia   . GERD (gastroesophageal reflux disease)   . Hernia 2014  . Hypertension   . MRSA (methicillin resistant staph aureus) culture positive 2011  . Neuropathy   . Nutcracker esophagus   . Rectus diastasis 07/19/2012    Past Surgical History:  Procedure Laterality Date  . BACK SURGERY  2012  . CHOLECYSTECTOMY    . COLONOSCOPY  Jan 2014   [name]  . CORONARY ANGIOPLASTY WITH STENT PLACEMENT  2009   stents x2, in [place], [place]  . FOOT SURGERY     Right  . NECK SURGERY    . SPINE SURGERY  2012,2013  . TONSILLECTOMY      Social History  reports that he has been smoking. He has a 15.00 pack-year smoking history. He has never used smokeless tobacco. He reports that he does not drink alcohol and does not use drugs.  Allergies  Allergen Reactions  . Acetaminophen Other (See Comments)    Reaction:  Unknown  Kidney failure Told not to take from home M.D. Related to kidney and renal failure   . Nsaids Other (See Comments)    Reaction:  Unknown  Kidney failure Patient states not to take from home M.D. Related to kidney and renal failure.  Other reaction(s): Unknown    Family History  Family history unknown: Yes     Prior to Admission medications   Medication Sig Start Date End Date Taking? Authorizing Provider  aspirin EC 81 MG tablet Take 81 mg by mouth daily.    [provider]  atorvastatin (LIPITOR) 80 MG tablet Take 80 mg by mouth at bedtime.    [provider]  Camphor-Menthol-Methyl Sal (SALONPAS) 3.01-15-08 % PTCH Apply 1 patch topically every 12 (twelve) hours as needed (pain).    [provider]  Cholecalciferol (VITAMIN D3) 50 MCG (2000 UT) TABS Take 2,000 Units by mouth daily.     [provider]  cyclobenzaprine (FLEXERIL) 5 MG tablet Take  1 tablet (5 mg total) by mouth 3 (three) times daily as needed. Patient taking differently: Take 5 mg by mouth 3 (three) times daily as needed for muscle spasms.  05/25/19   Menshew, Charlesetta Ivory, PA-C  DULoxetine (CYMBALTA) 60 MG capsule Take 60 mg by mouth daily.    [provider]  Ensure Max Protein (ENSURE MAX PROTEIN) LIQD Take 330 mLs (11 oz total) by mouth 2 (two) times daily between meals. 06/14/19   Sheikh, Omair Latif, DO  Fluticasone-Umeclidin-Vilant (TRELEGY ELLIPTA) 100-62.5-25 MCG/INH AEPB Inhale 1 puff into the lungs daily.    [provider]  furosemide (LASIX) 20 MG tablet Take 20 mg by mouth daily as needed for fluid.     [provider]  guaiFENesin (MUCINEX) 600 MG 12 hr tablet Take 600 mg by mouth 2 (two) times daily.    [provider]  insulin glargine (LANTUS) 100 UNIT/ML injection Inject 26 Units into the skin at bedtime.    [provider]  insulin lispro (HUMALOG) 100 UNIT/ML injection Inject 4 Units into the skin 3 (three) times daily before meals. Hold for FSBG <100    [provider]  ipratropium-albuterol (DUONEB) 0.5-2.5 (3) MG/3ML SOLN Take 3 mLs by nebulization every 6 (six) hours as needed. 06/14/19   Marguerita Merles Latif, DO  lactulose (CHRONULAC) 10 GM/15ML solution Take 20 g by mouth daily as needed for mild constipation.    [provider]  lamoTRIgine (LAMICTAL) 100 MG tablet Take 100 mg by mouth daily.    [provider]  metFORMIN (GLUCOPHAGE-XR) 500 MG 24 hr tablet Take 500 mg by mouth daily with breakfast.    [provider]  metoprolol tartrate (LOPRESSOR) 25 MG tablet Take 12.5 mg by mouth 2 (two) times daily.    [provider]  mupirocin cream (BACTROBAN) 2 % Apply to affected area 3 times daily Patient not taking: Reported on 06/10/2019 10/07/15   Cuthriell, Delorise Royals, PA-C  nicotine (NICODERM CQ - DOSED IN MG/24 HOURS) 21 mg/24hr patch Place 1 patch (21 mg total) onto  the skin daily. 06/14/19   Sheikh, Omair Latif, DO  NITROSTAT 0.4 MG SL tablet DISSOLVE (1) TABLET UNDER TONGUE AS NEEDED TO RELIEVE CHEST PAIN. MAYREPEAT EVERY 5 MINUTES. Patient taking differently: Place 0.4 mg under the tongue every 5 (five) minutes as needed for chest pain.  10/06/15   Iran Ouch, MD  ondansetron (ZOFRAN) 4 MG tablet Take 4 mg by mouth daily as needed for nausea or vomiting.    [provider]  oxymetazoline (AFRIN) 0.05 % nasal spray Place 2 sprays into both nostrils 2 (two) times daily as needed  for congestion.    [provider]  pantoprazole (PROTONIX) 40 MG tablet Take 40 mg by mouth daily.     [provider]  prednisoLONE 5 MG TABS tablet Take 1 tablet (5 mg total) by mouth daily. 06/21/19   Sheikh, Omair Latif, DO  predniSONE (STERAPRED UNI-PAK 21 TAB) 10 MG (21) TBPK tablet Take 6 tablets on day 1, 5 tablets on day 2, 4 tablets on day 3, 3 tablets on day 4, 2 tablets on day 5, 1 tablet on day 6 and then resume home prednisone 5 mg p.o. daily from then on 06/14/19   Marguerita Merles Latif, DO  pregabalin (LYRICA) 200 MG capsule Take 200 mg by mouth 3 (three) times daily.    [provider]  rivaroxaban (XARELTO) 20 MG TABS tablet Take 20 mg by mouth daily with supper.    [provider]  roflumilast (DALIRESP) 500 MCG TABS tablet Take 0.5 tablets (250 mcg total) by mouth daily. 06/15/19   Sheikh, Omair Latif, DO  senna-docusate (SENOKOT-S) 8.6-50 MG tablet Take 1 tablet by mouth daily.     [provider]    Physical Exam: Vitals:   09/12/19 2029 09/12/19 2032 09/12/19 2035 09/12/19 2103  BP:  (!) 101/55  (!) 100/56  Pulse:  79  71  Resp:  14  12  Temp:  98.8 F (37.1 C)    TempSrc:  Oral    SpO2: 99% 96%  94%  Weight:   99.8 kg   Height:   6\' 2"  (1.88 m)     Constitutional: NAD, calm, comfortable Vitals:   09/12/19 2029 09/12/19 2032 09/12/19 2035 09/12/19 2103  BP:  (!) 101/55  (!) 100/56  Pulse:  79  71   Resp:  14  12  Temp:  98.8 F (37.1 C)    TempSrc:  Oral    SpO2: 99% 96%  94%  Weight:   99.8 kg   Height:   6\' 2"  (1.88 m)    General: WDWN, drowsy but will wake up to voice and mumble.  Will answer simple questions but cannot provide extensive history Eyes: EOMI, PERRL, lids and conjunctivae normal.  Sclera nonicteric HENT:  [place]/AT, BiPAP mask is in place.  External ears normal.  Nares patent without epistasis.  Mucous membranes are moist.  No lesions of the lips or mouth. Neck: Soft, normal passive range of motion, supple, no masses, no thyromegaly.  Trachea midline Respiratory: Diminished but equal breath sounds.  Diffuse expiratory wheezing.  Bibasilar crackles noted. Normal respiratory effort on BiPAP. No accessory muscle use.  Cardiovascular: Regular rate and rhythm, no murmurs / rubs / gallops. +2 pitting edema of legs bilaterally.  No carotid bruits.  Abdomen: Soft, no tenderness, nondistended, no rebound or guarding.  No masses palpated. Bowel sounds normoactive Musculoskeletal: No tenderness palpation of muscle groups of trunk, upper extremities and lower extremities.  No clubbing / cyanosis. No joint deformity upper and lower extremities. no contractures. Normal muscle tone.  Skin: Warm, dry, intact no rashes, lesions, ulcers. No induration Neurologic: CN 2-12 grossly intact.  Speech is mumbled.  Withdraws extremities to painful stimuli.  Grip strength 3/5 in all extremities.     Labs on Admission: I have personally reviewed following labs and imaging studies  CBC: Recent Labs  Lab 09/12/19 2043  WBC 8.1  NEUTROABS 5.6  HGB 12.3*  HCT 38.2*  MCV 89.9  PLT 258    Basic Metabolic Panel: Recent Labs  Lab 09/12/19  2043  NA 133*  K 4.8  CL 87*  CO2 33*  GLUCOSE 103*  BUN 25*  CREATININE 1.61*  CALCIUM 8.4*  MG 1.6*    GFR: Estimated Creatinine Clearance: 56.9 mL/min (A) (by C-G formula based on SCr of 1.61 mg/dL (H)).  Liver Function Tests: Recent Labs   Lab 09/12/19 2043  AST 18  ALT 16  ALKPHOS 92  BILITOT 0.7  PROT 6.5  ALBUMIN 3.5    Urine analysis:    Component Value Date/Time   COLORURINE YELLOW (A) 11/10/2015 1709   APPEARANCEUR CLEAR (A) 11/10/2015 1709   APPEARANCEUR Clear 10/28/2013 2215   LABSPEC 1.016 11/10/2015 1709   LABSPEC 1.002 10/28/2013 2215   PHURINE 6.0 11/10/2015 1709   GLUCOSEU NEGATIVE 11/10/2015 1709   GLUCOSEU Negative 10/28/2013 2215   HGBUR NEGATIVE 11/10/2015 1709   BILIRUBINUR NEGATIVE 11/10/2015 1709   BILIRUBINUR Negative 10/28/2013 2215   KETONESUR TRACE (A) 11/10/2015 1709   PROTEINUR NEGATIVE 11/10/2015 1709   UROBILINOGEN 1.0 03/29/2009 0840   NITRITE NEGATIVE 11/10/2015 1709   LEUKOCYTESUR NEGATIVE 11/10/2015 1709   LEUKOCYTESUR Negative 10/28/2013 2215    Radiological Exams on Admission: DG Chest Portable 1 View  Result Date: 09/12/2019 CLINICAL DATA:  Shortness of breath and hypoxia. COPD. "Evaluate infiltrate " EXAM: PORTABLE CHEST 1 VIEW COMPARISON:  Most recent radiograph 06/14/2019 FINDINGS: Patchy opacity at both lung bases. Background bronchial thickening and interstitial coarsening. Stable heart size and mediastinal contours. Aortic atherosclerosis. Vascular stent in the region of the left subclavian. No pleural fluid or pneumothorax. Surgical hardware in the lower cervical spine partially included. Spinal stimulator in place. The bones appear under mineralized. IMPRESSION: Patchy opacity at both lung bases, may represent atelectasis, pneumonia, or aspiration. This has increased from imaging obtained 3 months ago. Electronically Signed   By: Narda Rutherford M.D.   On: 09/12/2019 21:28    EKG: Independently reviewed.  Normal sinus rhythm with nonspecific ST changes but no acute ST elevation or depression.  QTc 427  Assessment/Plan Principal Problem:   Acute on chronic respiratory failure with hypoxia and hypercapnia [facility])  Mr. Matney is admitted to progressive cardiac floor.  He is on Bipap and tolerating well at this time.  Will work to wean from BiPAP as tolerated tomorrow.  Recheck ABG in the morning.  Active Problems:   COPD with acute exacerbation (HCC) Patient being treated with BiPAP as above.  We will continue home trelegy inhaler and Daliresp.  DuoNeb provided as needed for shortness of breath, cough, wheeze. Patient is placed on doxycycline for antibiotic coverage with COPD exacerbation requiring hospitalization.  Will be given IV tonight and will convert to p.o. dosing once BiPAP is weaned.    Acute CHF (congestive heart failure) (HCC) Diurese with Lasix 40 mg IV twice daily for the next 2 days.  Monitor INO's.  Monitor daily weight. Check echocardiogram in the morning to evaluate wall motion, valvular function and EF. Continue beta-blocker therapy.  Start Entresto at low dose and monitor.  Patient is not on ACEI or ARB therapy.    NSTEMI (non-ST elevated myocardial infarction) [facility]) Patient with elevated troponin level with no acute ST changes on EKG.  Will monitor serial troponin levels. Patient is given aspirin daily.  Continue metoprolol Continue Xarelto    Edema Lasix for diuresis.  Monitor INO's and daily weight.    CAD (coronary artery disease) Continue home medications as above.    HTN (hypertension) On metoprolol and Entresto added.  Monitor blood  pressure.    Diabetes mellitus type 2, uncontrolled, with complications (HCC) Monitor FSBS qac, qhs. SSI as needed for glycemic control. Check hemoglobin A1c. Patient would benefit from SLGT2 inhibitor therapy in light of CHF with DM. Initiate jardiance.  Hold metformin with AKI.  Hold basal insulin until diet is resumed when Bipap is weaned.     AKI (acute kidney injury) [facility]) Patient with acute kidney injury on labs.  Likely secondary to acute CHF and NSTEMI. Will monitor electrolytes and renal function closely and repeat labs in the morning IV fluid hydration is held in light of CHF  exacerbation    DVT prophylaxis: Patient is anticoagulated on Xarelto daily. Code Status:   Full code Family Communication:  No family is at bedside.  Disposition Plan:   Patient is from:  SNF  Anticipated DC to:  SNF  Anticipated DC date:  Anticipate greater than 2 midnight stay in the hospital to treat acute medical condition  Anticipated DC barriers: No barriers to discharge identified at this time  Admission status:  Inpatient  Severity of Illness: The appropriate patient status for this patient is INPATIENT. Inpatient status is judged to be reasonable and necessary in order to provide the required intensity of service to ensure the patient's safety. The patient's presenting symptoms, physical exam findings, and initial radiographic and laboratory data in the context of their chronic comorbidities is felt to place them at high risk for further clinical deterioration. Furthermore, it is not anticipated that the patient will be medically stable for discharge from the hospital within 2 midnights of admission. The following factors support the patient status of inpatient.   * I certify that at the point of admission it is my clinical judgment that the patient will require inpatient hospital care spanning beyond 2 midnights from the point of admission due to high intensity of service, high risk for further deterioration and high frequency of surveillance required.Claudean Severance Mohamad Bruso MD [facility]  How to contact the [facility] Attending or Consulting provider 7A - 7P or covering provider during after hours 7P -7A, for this patient?   1. Check the care team in [facility] and look for a) attending/consulting [facility] provider listed and b) the [facility] team listed 2. Log into [email or web address] and use [facility] universal password to access. If you do not have the password, please contact the hospital operator. 3. Locate the [facility] provider you are looking for under [facility] and page to a number  that you can be directly reached. 4. If you still have difficulty reaching the provider, please page the [facility] (Director on Call) for the Hospitalists listed on amion for assistance.  09/12/2019, 11:27 PM

## 2019-09-12 NOTE — ED Provider Notes (Addendum)
[facility] Emergency Department Provider Note ____________________________________________   First MD Initiated Contact with Patient 09/12/19 2019     (approximate)  I have reviewed the triage vital signs and the nursing notes.  HISTORY  Chief Complaint Respiratory Distress   HPI Gregory Hall is a 64 y.o. malewho presents to the ED for evaluation of shortness of breath.   Chart review indicates history of CAD status post stenting, HTN, HLD, DM on insulin, COPD on 2 L home O2.  Patient missed to the ED from a local group home via EMS due to respiratory distress.  EMS reports finding the patient somnolent and hypoxic to the 50s on room air, providing him with 2 duo nebs and supplemental oxygen with improving clinical status.  Patient settled out on 6 L O2 with EMS.  Here in the ED, patient denies any recent illnesses.  Reports feeling short of breath and cannot provide additional history. History is limited due to his respiratory distress and altered mental status.  Past Medical History:  Diagnosis Date  . AKI (acute kidney injury) (HCC) 09/12/2019  . CAD (coronary artery disease)    s/p PTCA and stent x2  . Chest pain   . Chronic pain syndrome   . COPD (chronic obstructive pulmonary disease) (HCC)   . Degenerative cervical disc   . Depression   . Diabetes mellitus without complication (HCC)   . Dyslipidemia   . GERD (gastroesophageal reflux disease)   . Hernia 2014  . Hypertension   . MRSA (methicillin resistant staph aureus) culture positive 2011  . Neuropathy   . Nutcracker esophagus   . Rectus diastasis 07/19/2012    Patient Active Problem List   Diagnosis Date Noted  . Acute CHF (congestive heart failure) (HCC) 09/12/2019  . NSTEMI (non-ST elevated myocardial infarction) (HCC) 09/12/2019  . AKI (acute kidney injury) (HCC) 09/12/2019  . Diabetes mellitus type 2, uncontrolled, with complications (HCC) 06/10/2019  . Chronic pain  syndrome 06/10/2019  . COPD exacerbation (HCC) 06/10/2019  . COPD with acute exacerbation (HCC) 06/09/2019  . CAD (coronary artery disease) 06/09/2019  . HTN (hypertension) 06/09/2019  . Acute on chronic respiratory failure with hypoxia and hypercapnia (HCC) 06/09/2019  . Anxiety 06/09/2019  . Chronic prescription opiate use 06/09/2019  . Hyperglycemia 06/09/2019  . Subclavian artery stenosis, left (HCC) 08/20/2014  . Rectus diastasis 07/19/2012  . Hernia   . Edema 05/10/2011  . Preoperative cardiovascular examination 10/06/2010  . SMOKER 11/25/2009  . CAROTID BRUIT, RIGHT 11/24/2009  . Chest pain 11/24/2009  . Hyperlipidemia 03/30/2009  . Coronary atherosclerosis 03/30/2009  . HYPERTENSION, HX OF 03/30/2009    Past Surgical History:  Procedure Laterality Date  . BACK SURGERY  2012  . CHOLECYSTECTOMY    . COLONOSCOPY  Jan 2014   [name]  . CORONARY ANGIOPLASTY WITH STENT PLACEMENT  2009   stents x2, in [place], [place]  . FOOT SURGERY     Right  . NECK SURGERY    . SPINE SURGERY  2012,2013  . TONSILLECTOMY      Prior to Admission medications   Medication Sig Start Date End Date Taking? Authorizing Provider  aspirin EC 81 MG tablet Take 81 mg by mouth daily.    [provider]  atorvastatin (LIPITOR) 80 MG tablet Take 80 mg by mouth at bedtime.    [provider]  Camphor-Menthol-Methyl Sal (SALONPAS) 3.01-15-08 % PTCH Apply 1 patch topically every 12 (twelve) hours as needed (pain).  [provider]  Cholecalciferol (VITAMIN D3) 50 MCG (2000 UT) TABS Take 2,000 Units by mouth daily.     [provider]  cyclobenzaprine (FLEXERIL) 5 MG tablet Take 1 tablet (5 mg total) by mouth 3 (three) times daily as needed. Patient taking differently: Take 5 mg by mouth 3 (three) times daily as needed for muscle spasms.  05/25/19   Menshew, Charlesetta Ivory, PA-C  DULoxetine (CYMBALTA) 60 MG capsule Take 60 mg by mouth daily.    [provider]   Ensure Max Protein (ENSURE MAX PROTEIN) LIQD Take 330 mLs (11 oz total) by mouth 2 (two) times daily between meals. 06/14/19   Sheikh, Omair Latif, DO  Fluticasone-Umeclidin-Vilant (TRELEGY ELLIPTA) 100-62.5-25 MCG/INH AEPB Inhale 1 puff into the lungs daily.    [provider]  furosemide (LASIX) 20 MG tablet Take 20 mg by mouth daily as needed for fluid.     [provider]  guaiFENesin (MUCINEX) 600 MG 12 hr tablet Take 600 mg by mouth 2 (two) times daily.    [provider]  insulin glargine (LANTUS) 100 UNIT/ML injection Inject 26 Units into the skin at bedtime.    [provider]  insulin lispro (HUMALOG) 100 UNIT/ML injection Inject 4 Units into the skin 3 (three) times daily before meals. Hold for FSBG <100    [provider]  ipratropium-albuterol (DUONEB) 0.5-2.5 (3) MG/3ML SOLN Take 3 mLs by nebulization every 6 (six) hours as needed. 06/14/19   Marguerita Merles Latif, DO  lactulose (CHRONULAC) 10 GM/15ML solution Take 20 g by mouth daily as needed for mild constipation.    [provider]  lamoTRIgine (LAMICTAL) 100 MG tablet Take 100 mg by mouth daily.    [provider]  metFORMIN (GLUCOPHAGE-XR) 500 MG 24 hr tablet Take 500 mg by mouth daily with breakfast.    [provider]  metoprolol tartrate (LOPRESSOR) 25 MG tablet Take 12.5 mg by mouth 2 (two) times daily.    [provider]  mupirocin cream (BACTROBAN) 2 % Apply to affected area 3 times daily Patient not taking: Reported on 06/10/2019 10/07/15   Cuthriell, Delorise Royals, PA-C  nicotine (NICODERM CQ - DOSED IN MG/24 HOURS) 21 mg/24hr patch Place 1 patch (21 mg total) onto the skin daily. 06/14/19   Sheikh, Omair Latif, DO  NITROSTAT 0.4 MG SL tablet DISSOLVE (1) TABLET UNDER TONGUE AS NEEDED TO RELIEVE CHEST PAIN. MAYREPEAT EVERY 5 MINUTES. Patient taking differently: Place 0.4 mg under the tongue every 5 (five) minutes as needed for chest pain.  10/06/15   Iran Ouch, MD  ondansetron (ZOFRAN) 4 MG tablet Take 4 mg by mouth daily as needed for nausea or vomiting.    [provider]  oxymetazoline (AFRIN) 0.05 % nasal spray Place 2 sprays into both nostrils 2 (two) times daily as needed for congestion.    [provider]  pantoprazole (PROTONIX) 40 MG tablet Take 40 mg by mouth daily.     [provider]  prednisoLONE 5 MG TABS tablet Take 1 tablet (5 mg total) by mouth daily. 06/21/19   Sheikh, Omair Latif, DO  predniSONE (STERAPRED UNI-PAK 21 TAB) 10 MG (21) TBPK tablet Take 6 tablets on day 1, 5 tablets on day 2, 4 tablets on day 3, 3 tablets on day 4, 2 tablets on day 5, 1 tablet on day 6 and then resume home prednisone 5 mg p.o. daily from then on 06/14/19   Merlene Laughter,  DO  pregabalin (LYRICA) 200 MG capsule Take 200 mg by mouth 3 (three) times daily.    [provider]  rivaroxaban (XARELTO) 20 MG TABS tablet Take 20 mg by mouth daily with supper.    [provider]  roflumilast (DALIRESP) 500 MCG TABS tablet Take 0.5 tablets (250 mcg total) by mouth daily. 06/15/19   Sheikh, Omair Latif, DO  senna-docusate (SENOKOT-S) 8.6-50 MG tablet Take 1 tablet by mouth daily.     [provider]    Allergies Acetaminophen and Nsaids  Family History  Family history unknown: Yes    Social History Social History   Tobacco Use  . Smoking status: Current Every Day Smoker    Packs/day: 0.50    Years: 30.00    Pack years: 15.00  . Smokeless tobacco: Never Used  Substance Use Topics  . Alcohol use: No  . Drug use: No    Review of Systems  Unable to be reliably obtained due to patient's altered mental status and acuity of condition  ____________________________________________   PHYSICAL EXAM:  VITAL SIGNS: Vitals:   09/12/19 2032 09/12/19 2103  BP: (!) 101/55 (!) 100/56  Pulse: 79 71  Resp: 14 12  Temp: 98.8 F (37.1 C)   SpO2: 96% 94%      Constitutional: Somnolent and  with sonorous respirations.  Will awaken to loud vocal stimulation and follow commands in all 4 extremities.  He quickly falls back asleep.  Eyes: Conjunctivae are normal. PERRL. EOMI. Head: Atraumatic. Nose: No congestion/rhinnorhea. Mouth/Throat: Mucous membranes are dry.  Oropharynx non-erythematous. Neck: No stridor. No cervical spine tenderness to palpation. Cardiovascular: Normal rate, regular rhythm. Grossly normal heart sounds.  Good peripheral circulation. Respiratory: No retractions.  Tachypneic to the mid 20s.  Poor air movement with diffuse expiratory wheezes, bibasilar crackles are present. Gastrointestinal: Soft , nondistended, nontender to palpation. No abdominal bruits. No CVA tenderness. Musculoskeletal: No lower extremity tenderness.  No joint effusions. No signs of acute trauma. Significant and symmetric pitting edema to bilateral lower extremities up to the knees. Neurologic:  No gross focal neurologic deficits are appreciated.  Skin:  Skin is warm, dry and intact. No rash noted. Psychiatric: Mood and affect are unable to be assessed  ____________________________________________   LABS (all labs ordered are listed, but only abnormal results are displayed)  Labs Reviewed  BLOOD GAS, ARTERIAL - Abnormal; Notable for the following components:      Result Value   pH, Arterial 7.29 (*)    pCO2 arterial 76 (*)    pO2, Arterial 121 (*)    Bicarbonate 36.5 (*)    Acid-Base Excess 7.4 (*)    All other components within normal limits  CBC WITH DIFFERENTIAL/PLATELET - Abnormal; Notable for the following components:   Hemoglobin 12.3 (*)    HCT 38.2 (*)    RDW 16.6 (*)    All other components within normal limits  COMPREHENSIVE METABOLIC PANEL - Abnormal; Notable for the following components:   Sodium 133 (*)    Chloride 87 (*)    CO2 33 (*)    Glucose, Bld 103 (*)    BUN 25 (*)    Creatinine, Ser 1.61 (*)    Calcium 8.4 (*)    GFR calc non Af Amer 44 (*)    GFR  calc Af Amer 51 (*)    All other components within normal limits  BRAIN NATRIURETIC PEPTIDE - Abnormal; Notable for the following components:   B Natriuretic Peptide 458.1 (*)  All other components within normal limits  MAGNESIUM - Abnormal; Notable for the following components:   Magnesium 1.6 (*)    All other components within normal limits  TROPONIN I (HIGH SENSITIVITY) - Abnormal; Notable for the following components:   Troponin I (High Sensitivity) 78 (*)    All other components within normal limits  SARS CORONAVIRUS 2 BY RT PCR (HOSPITAL ORDER, PERFORMED IN [facility])  LACTIC ACID, PLASMA  TROPONIN I (HIGH SENSITIVITY)   ____________________________________________  12 Lead EKG  Sinus rhythm, rate of 80 bpm, normal axis, normal intervals.  No evidence of acute ischemia. ____________________________________________  RADIOLOGY  ED MD interpretation: CXR with pulmonary vascular congestion.  Official radiology report(s): DG Chest Portable 1 View  Result Date: 09/12/2019 CLINICAL DATA:  Shortness of breath and hypoxia. COPD. "Evaluate infiltrate " EXAM: PORTABLE CHEST 1 VIEW COMPARISON:  Most recent radiograph 06/14/2019 FINDINGS: Patchy opacity at both lung bases. Background bronchial thickening and interstitial coarsening. Stable heart size and mediastinal contours. Aortic atherosclerosis. Vascular stent in the region of the left subclavian. No pleural fluid or pneumothorax. Surgical hardware in the lower cervical spine partially included. Spinal stimulator in place. The bones appear under mineralized. IMPRESSION: Patchy opacity at both lung bases, may represent atelectasis, pneumonia, or aspiration. This has increased from imaging obtained 3 months ago. Electronically Signed   By: Narda Rutherford M.D.   On: 09/12/2019 21:28   ____________________________________________   PROCEDURES and INTERVENTIONS  Procedure(s) performed (including Critical Care):  .1-3  Lead EKG Interpretation Performed by: Delton Prairie, MD Authorized by: Delton Prairie, MD     Interpretation: normal     ECG rate:  88   ECG rate assessment: normal     Rhythm: sinus rhythm     Ectopy: none   .Critical Care Performed by: Delton Prairie, MD Authorized by: Delton Prairie, MD   Critical care provider statement:    Critical care time (minutes):  32   Critical care was necessary to treat or prevent imminent or life-threatening deterioration of the following conditions:  Respiratory failure and circulatory failure   Critical care was time spent personally by me on the following activities:  Discussions with consultants, evaluation of patient's response to treatment, examination of patient, ordering and performing treatments and interventions, ordering and review of laboratory studies, ordering and review of radiographic studies, pulse oximetry, re-evaluation of patient's condition, obtaining history from patient or surrogate and review of old charts    Medications  furosemide (LASIX) injection 40 mg (has no administration in time range)  albuterol (PROVENTIL) (2.5 MG/3ML) 0.083% nebulizer solution 10 mg (10 mg Nebulization Given 09/12/19 2118)  ipratropium (ATROVENT) nebulizer solution 0.5 mg (0.5 mg Nebulization Given 09/12/19 2123)    ____________________________________________   MDM / ED COURSE  64 year old male presenting from home in respiratory distress with evidence of mixed COPD/CHF exacerbation, requiring BiPAP and medical admission.  Patient hypoxic on home O2 in the field requiring escalation to 6 L O2, otherwise hemodynamically stable.  Exam initially with a somnolent patient, likely due to CO2 narcosis, without evidence of focal neurologic deficits or trauma.  ABG shows hypercapnic failure, so patient was placed on BiPAP with improving mental status.  Blood work otherwise demonstrates mild AKI on CKD, and elevation of his BNP.  His examination is with evidence of volume  overload, in combination with his elevated BNP, I am concerned about the possibility of cardiorenal syndrome and so patient was provided 40 mg of IV Lasix.  Nonischemic  EKG and no evidence of ACS at this time, mild elevation in troponin likely due to strain in the setting of his respiratory distress.  Patient clinically improving on BiPAP and no need for intubation at this time.  Will admit the patient to hospitalist medicine for further work-up and management of his respiratory distress.  Clinical Course as of Sep 11 2325  Fri Sep 12, 2019  2108 Reassessed.  ABG shows hypercapnic failure.  Will start BiPAP at this time.   [DS]    Clinical Course User Index [DS] Delton Prairie, MD     ____________________________________________   FINAL CLINICAL IMPRESSION(S) / ED DIAGNOSES  Final diagnoses:  Acute respiratory failure with hypercapnia (HCC)  COPD exacerbation (HCC)  Respiratory distress  AKI (acute kidney injury) [facility])     ED Discharge Orders    None       Gregory Hall   Note:  This document was prepared using Dragon voice recognition software and may include unintentional dictation errors.   Delton Prairie, MD 09/12/19 4540    Delton Prairie, MD 09/12/19 [id number]

## 2019-09-12 NOTE — ED Triage Notes (Signed)
Pt arrives to ED from group home via [facility] with c/c of respiratory distress and bilateral LE edema. EMS reports transport vitals of 126/67, p80 NSR, r20, O2 sat 92% on 3L via nasal canula. Pt working hard to breathe and received two duo nebs in route. Upon arrival pt Alert and oriented to self and date but confused about address and current location. Dr Katrinka Blazing at bedside.

## 2019-09-13 DIAGNOSIS — J9621 Acute and chronic respiratory failure with hypoxia: Secondary | ICD-10-CM | POA: Diagnosis present

## 2019-09-13 LAB — HEMOGLOBIN A1C
Hgb A1c MFr Bld: 7 % — ABNORMAL HIGH (ref 4.8–5.6)
Mean Plasma Glucose: 154.2 mg/dL

## 2019-09-13 LAB — GLUCOSE, CAPILLARY
Glucose-Capillary: 99 mg/dL (ref 70–99)
Glucose-Capillary: 106 mg/dL — ABNORMAL HIGH (ref 70–99)
Glucose-Capillary: 160 mg/dL — ABNORMAL HIGH (ref 70–99)
Glucose-Capillary: 149 mg/dL — ABNORMAL HIGH (ref 70–99)
Glucose-Capillary: 207 mg/dL — ABNORMAL HIGH (ref 70–99)

## 2019-09-13 LAB — BASIC METABOLIC PANEL
Anion gap: 8 (ref 5–15)
BUN: 22 mg/dL (ref 8–23)
CO2: 38 mmol/L — ABNORMAL HIGH (ref 22–32)
Calcium: 8.4 mg/dL — ABNORMAL LOW (ref 8.9–10.3)
Chloride: 88 mmol/L — ABNORMAL LOW (ref 98–111)
Creatinine, Ser: 1.57 mg/dL — ABNORMAL HIGH (ref 0.61–1.24)
GFR calc Af Amer: 52 mL/min — ABNORMAL LOW (ref >60)
GFR calc non Af Amer: 45 mL/min — ABNORMAL LOW (ref >60)
Glucose, Bld: 92 mg/dL (ref 70–99)
Potassium: 4.1 mmol/L (ref 3.5–5.1)
Sodium: 134 mmol/L — ABNORMAL LOW (ref 135–145)

## 2019-09-13 MED ORDER — OXYCODONE HCL 5 MG PO TABS
5.0000 mg | ORAL_TABLET | Freq: Once | ORAL | Status: AC
  Administered 2019-09-13: 5 mg via ORAL
  Filled 2019-09-13: qty 1

## 2019-09-13 MED ORDER — POTASSIUM CHLORIDE CRYS ER 20 MEQ PO TBCR
20.0000 meq | EXTENDED_RELEASE_TABLET | Freq: Two times a day (BID) | ORAL | Status: DC
  Filled 2019-09-13: qty 1

## 2019-09-13 MED ORDER — ACETAMINOPHEN 325 MG PO TABS
650.0000 mg | ORAL_TABLET | Freq: Four times a day (QID) | ORAL | Status: DC | PRN
  Administered 2019-09-13: 650 mg via ORAL
  Filled 2019-09-13: qty 2

## 2019-09-13 MED ORDER — DOXYCYCLINE HYCLATE 100 MG PO TABS
100.0000 mg | ORAL_TABLET | Freq: Two times a day (BID) | ORAL | Status: DC
  Administered 2019-09-13 – 2019-09-14 (×2): 100 mg via ORAL
  Filled 2019-09-13 (×2): qty 1

## 2019-09-13 MED ORDER — DOXYCYCLINE HYCLATE 100 MG PO TABS: 100.0000 mg | ORAL_TABLET | Freq: Two times a day (BID) | ORAL | Status: DC

## 2019-09-13 MED ORDER — FLUTICASONE FUROATE-VILANTEROL 100-25 MCG/INH IN AEPB
1.0000 | INHALATION_SPRAY | Freq: Every day | RESPIRATORY_TRACT | Status: DC
  Administered 2019-09-13: 1 via RESPIRATORY_TRACT
  Filled 2019-09-13: qty 28

## 2019-09-13 MED ORDER — UMECLIDINIUM BROMIDE 62.5 MCG/INH IN AEPB
1.0000 | INHALATION_SPRAY | Freq: Every day | RESPIRATORY_TRACT | Status: DC
  Administered 2019-09-13: 10:00:00 1 via RESPIRATORY_TRACT
  Filled 2019-09-13: qty 7

## 2019-09-13 MED ORDER — IPRATROPIUM-ALBUTEROL 0.5-2.5 (3) MG/3ML IN SOLN
3.0000 mL | Freq: Four times a day (QID) | RESPIRATORY_TRACT | Status: DC
  Administered 2019-09-13 – 2019-09-14 (×4): 3 mL via RESPIRATORY_TRACT
  Filled 2019-09-13 (×4): qty 3

## 2019-09-13 MED ORDER — PREGABALIN 75 MG PO CAPS
200.0000 mg | ORAL_CAPSULE | Freq: Three times a day (TID) | ORAL | Status: DC
  Administered 2019-09-13 – 2019-09-14 (×2): 200 mg via ORAL
  Filled 2019-09-13 (×2): qty 1

## 2019-09-13 MED ORDER — TRAZODONE HCL 100 MG PO TABS
100.0000 mg | ORAL_TABLET | Freq: Every day | ORAL | Status: DC
  Administered 2019-09-13: 100 mg via ORAL
  Filled 2019-09-13: qty 1

## 2019-09-13 MED ORDER — PREDNISONE 20 MG PO TABS
40.0000 mg | ORAL_TABLET | Freq: Every day | ORAL | Status: DC
  Administered 2019-09-14: 40 mg via ORAL
  Filled 2019-09-13: qty 2

## 2019-09-13 MED ORDER — OXYCODONE HCL 5 MG PO TABS
10.0000 mg | ORAL_TABLET | Freq: Three times a day (TID) | ORAL | Status: DC | PRN
  Administered 2019-09-13 – 2019-09-14 (×4): 10 mg via ORAL
  Filled 2019-09-13 (×5): qty 2

## 2019-09-13 MED ORDER — IPRATROPIUM-ALBUTEROL 0.5-2.5 (3) MG/3ML IN SOLN: 3.0000 mL | RESPIRATORY_TRACT | Status: DC | PRN

## 2019-09-13 MED ORDER — METHYLPREDNISOLONE SODIUM SUCC 40 MG IJ SOLR
40.0000 mg | Freq: Four times a day (QID) | INTRAMUSCULAR | Status: AC
  Administered 2019-09-13 – 2019-09-14 (×4): 40 mg via INTRAVENOUS
  Filled 2019-09-13 (×4): qty 1

## 2019-09-13 NOTE — Progress Notes (Signed)
PT Cancellation Note  Patient Details Name: Gregory Hall [name] DOB: 11/12/1955   Cancelled Treatment:    Reason Eval/Treat Not Completed: Patient not medically ready, O2 sat is 78%-82% and patient is refusing Bipap. Will check at later date and determine if he is medically abel to participate in PT.   [address] [place] DPT 09/13/2019, 12:10 PM

## 2019-09-13 NOTE — ED Notes (Signed)
Patient refused dinner.

## 2019-09-13 NOTE — Progress Notes (Signed)
[facility] PROGRESS NOTE    Aadhavan Clyatt  OXB:[id number] DOB: 09/18/1955 DOA: 09/12/2019 PCP: Armando Gang, FNP      Brief Narrative:  Mr. Bergum is a 64 y.o. M with COPD on home O2, CAD s/p PCI years ago, DM, chronic pain on daily opiates, and HTN who presented with somnolence and respiratory distress from his long-term care facility.  In the ER the patient was wheezy, chest x-ray with bibasilar opacities, leg swelling noted, BNP elevated.     Assessment & Plan:  Acute on chronic respiratory failure with hypoxia and hypercapnia Patient presented with respiratory distress, decreased mentation due to hypercarbia, and hypoxia worse from his baseline, requiring BiPAP to keep SPO2 greater than 90%.  This appears to be due to a combination of COPD exacerbation and CHF flare.  This morning, slightly better, mentation good, I feel safe to d/c BiPAP.    Acute on chronic diastolic CHF Still appears massively fluid overloaded -Furosemide 40 mg IV twice a day  -K supplement -Strict I/Os, daily weights, telemetry  -Daily monitoring renal function -Obtain echo   Acute exacerbation of COPD -Start steroids -Continue bronchodilators, scheduled and as needed -Continue doxycycline -Continue roflumilast -Continue home ICS/LABA/LAMA  Diabetes, type II, well controlled, insulin-dependent, without complication Controlled -Continue sliding scale corrections -Hold home Lantus  Coronary disease, secondary prevention Hypertension Pressure controlled -Continue aspirin, statin -Continue metoprolol, Entresto, consider transitioning back to lisinopril pending echo  Chronic pain syndrome -Continue home oxycodone -Continue lamotrigine  AKI Baseline Cr 0.9.  Cr here 1.6, still 1.6 today, likely congestive nephropathy -Continue diuresis and close monitoring of renal function  Acquired thrombophilia Patient is on blood thinner, unclear reason why I can  find no collateral.  Patient think this is for "my hear attacks" but if so, this is INCORRECT DOSAGE.  I suspect patient is incorrect, and he takes this for another reason.  Will defer to PCP to determine cause for Xarelto, and adjust dose if truly for CV prevention -Continue Xarelto              Disposition: Status is: Inpatient  Remains inpatient appropriate because:Inpatient level of care appropriate due to severity of illness   Dispo: The patient is from: SNF              Anticipated d/c is to: SNF              Anticipated d/c date is: 3 days              Patient currently is not medically stable to d/c.    Patient was admitted with acute respiratory failure due to CHF flare.  He has been weaned off of the BiPAP, but he remains significantly fluid overloaded and will need several more days of IV Lasix likely.         MDM: The below labs and imaging reports were reviewed and summarized above.  Medication management as above.  This is a severe illness with threat to life or bodily function, his anticoagulation was managed   DVT prophylaxis:  rivaroxaban (XARELTO) tablet 20 mg  Code Status: Full code Family Communication: Attempted call to primary contact as well as sister, no answer to either    Consultants:     Procedures:   Echo pending  Antimicrobials:   Doxycycline 9/3>>  Culture data:              Subjective: Patient was sleepier this morning, after being on the  BiPAP for another 2 hours he is improved.  Appetite is good, no respiratory distress.  He still seems out of breath that is still extremely swollen.  He has some back pain, which is chronic for him  Objective: Vitals:   09/13/19 0730 09/13/19 0752 09/13/19 0800 09/13/19 0830  BP: 115/61  131/69 (!) 156/66  Pulse: 76 99 92 87  Resp: 13 19 14 14   Temp:      TempSrc:      SpO2: 94% (!) 78% (!) 82% 95%  Weight:      Height:        Intake/Output Summary (Last 24 hours)  at 09/13/2019 0914 Last data filed at 09/13/2019 1610 Gross per 24 hour  Intake 250 ml  Output 1150 ml  Net -900 ml   Filed Weights   09/12/19 2035  Weight: 99.8 kg    Examination: General appearance: Older adult male, awake, not sleepy, in no obvious distress.   HEENT: Anicteric, conjunctiva pink, lids and lashes normal. No nasal deformity, discharge, epistaxis.  Lips moist.   Skin: Warm and dry.  No jaundice.  No suspicious rashes or lesions. Cardiac: RRR, nl S1-S2, no murmurs appreciated.  Capillary refill is brisk.  JVP not visible.  3+ LE edema.  Radial pulses 2+ and symmetric. Respiratory: Normal respiratory rate at rest, seems out of breath with exertion, wheezing bilaterally, diminished at bases, I do not appreciate rales Abdomen: Abdomen soft.  No TTP or guarding. No ascites, distension, hepatosplenomegaly.   MSK: No deformities or effusions. Neuro: Awake restless due to back pain, was pretty sleepy before, now better.  EOMI, moves all extremities with generalized weakness.  Speech fluent.    Psych: Sensorium intact and responding to questions, attention normal. Affect blunted.  Judgment and insight appear mildly impaired.    Data Reviewed: I have personally reviewed following labs and imaging studies:  CBC: Recent Labs  Lab 09/12/19 2043  WBC 8.1  NEUTROABS 5.6  HGB 12.3*  HCT 38.2*  MCV 89.9  PLT 258   Basic Metabolic Panel: Recent Labs  Lab 09/12/19 2043 09/13/19 0646  NA 133* 134*  K 4.8 4.1  CL 87* 88*  CO2 33* 38*  GLUCOSE 103* 92  BUN 25* 22  CREATININE 1.61* 1.57*  CALCIUM 8.4* 8.4*  MG 1.6*  --    GFR: Estimated Creatinine Clearance: 58.4 mL/min (A) (by C-G formula based on SCr of 1.57 mg/dL (H)). Liver Function Tests: Recent Labs  Lab 09/12/19 2043  AST 18  ALT 16  ALKPHOS 92  BILITOT 0.7  PROT 6.5  ALBUMIN 3.5   No results for input(s): LIPASE, AMYLASE in the last 168 hours. No results for input(s): AMMONIA in the last 168  hours. Coagulation Profile: No results for input(s): INR, PROTIME in the last 168 hours. Cardiac Enzymes: No results for input(s): CKTOTAL, CKMB, CKMBINDEX, TROPONINI in the last 168 hours. BNP (last 3 results) No results for input(s): PROBNP in the last 8760 hours. HbA1C: No results for input(s): HGBA1C in the last 72 hours. CBG: Recent Labs  Lab 09/13/19 0353  GLUCAP 99   Lipid Profile: No results for input(s): CHOL, HDL, LDLCALC, TRIG, CHOLHDL, LDLDIRECT in the last 72 hours. Thyroid Function Tests: No results for input(s): TSH, T4TOTAL, FREET4, T3FREE, THYROIDAB in the last 72 hours. Anemia Panel: No results for input(s): VITAMINB12, FOLATE, FERRITIN, TIBC, IRON, RETICCTPCT in the last 72 hours. Urine analysis:    Component Value Date/Time   COLORURINE YELLOW (A) 11/10/2015  1709   APPEARANCEUR CLEAR (A) 11/10/2015 1709   APPEARANCEUR Clear 10/28/2013 2215   LABSPEC 1.016 11/10/2015 1709   LABSPEC 1.002 10/28/2013 2215   PHURINE 6.0 11/10/2015 1709   GLUCOSEU NEGATIVE 11/10/2015 1709   GLUCOSEU Negative 10/28/2013 2215   HGBUR NEGATIVE 11/10/2015 1709   BILIRUBINUR NEGATIVE 11/10/2015 1709   BILIRUBINUR Negative 10/28/2013 2215   KETONESUR TRACE (A) 11/10/2015 1709   PROTEINUR NEGATIVE 11/10/2015 1709   UROBILINOGEN 1.0 03/29/2009 0840   NITRITE NEGATIVE 11/10/2015 1709   LEUKOCYTESUR NEGATIVE 11/10/2015 1709   LEUKOCYTESUR Negative 10/28/2013 2215   Sepsis Labs: @LABRCNTIP (procalcitonin:4,lacticacidven:4)  ) Recent Results (from the past 240 hour(s))  SARS Coronavirus 2 by RT PCR (hospital order, performed in [facility] hospital lab) Nasopharyngeal Nasopharyngeal Swab     Status: None   Collection Time: 09/12/19  8:43 PM   Specimen: Nasopharyngeal Swab  Result Value Ref Range Status   SARS Coronavirus 2 NEGATIVE NEGATIVE Final    Comment: (NOTE) SARS-CoV-2 target nucleic acids are NOT DETECTED.  The SARS-CoV-2 RNA is generally detectable in upper and  lower respiratory specimens during the acute phase of infection. The lowest concentration of SARS-CoV-2 viral copies this assay can detect is 250 copies / mL. A negative result does not preclude SARS-CoV-2 infection and should not be used as the sole basis for treatment or other patient management decisions.  A negative result may occur with improper specimen collection / handling, submission of specimen other than nasopharyngeal swab, presence of viral mutation(s) within the areas targeted by this assay, and inadequate number of viral copies (<250 copies / mL). A negative result must be combined with clinical observations, patient history, and epidemiological information.  Fact Sheet for Patients:   11/12/19  Fact Sheet for Healthcare Providers: BoilerBrush.com.cy  This test is not yet approved or  cleared by the https://pope.com/ FDA and has been authorized for detection and/or diagnosis of SARS-CoV-2 by FDA under an Emergency Use Authorization (EUA).  This EUA will remain in effect (meaning this test can be used) for the duration of the COVID-19 declaration under Section 564(b)(1) of the Act, 21 U.S.C. section 360bbb-3(b)(1), unless the authorization is terminated or revoked sooner.  Performed at [facility], [address], [place], [place]          Radiology Studies: DG Chest Portable 1 View  Result Date: 09/12/2019 CLINICAL DATA:  Shortness of breath and hypoxia. COPD. "Evaluate infiltrate " EXAM: PORTABLE CHEST 1 VIEW COMPARISON:  Most recent radiograph 06/14/2019 FINDINGS: Patchy opacity at both lung bases. Background bronchial thickening and interstitial coarsening. Stable heart size and mediastinal contours. Aortic atherosclerosis. Vascular stent in the region of the left subclavian. No pleural fluid or pneumothorax. Surgical hardware in the lower cervical spine partially included. Spinal  stimulator in place. The bones appear under mineralized. IMPRESSION: Patchy opacity at both lung bases, may represent atelectasis, pneumonia, or aspiration. This has increased from imaging obtained 3 months ago. Electronically Signed   By: 08/14/2019 M.D.   On: 09/12/2019 21:28        Scheduled Meds:  aspirin EC  81 mg Oral Daily   atorvastatin  80 mg Oral QHS   doxycycline  100 mg Oral Q12H   fluticasone furoate-vilanterol  1 puff Inhalation Daily   And   umeclidinium bromide  1 puff Inhalation Daily   furosemide  40 mg Intravenous Q12H   insulin aspart  0-5 Units Subcutaneous QHS   insulin aspart  0-9 Units  Subcutaneous TID WC   ipratropium-albuterol  3 mL Nebulization Q6H   lamoTRIgine  100 mg Oral Daily   methylPREDNISolone (SOLU-MEDROL) injection  40 mg Intravenous Q6H   Followed by   Melene Muller ON 09/14/2019] predniSONE  40 mg Oral Q breakfast   metoprolol tartrate  12.5 mg Oral BID   potassium chloride  20 mEq Oral Daily   potassium chloride  20 mEq Oral BID   rivaroxaban  20 mg Oral Q supper   roflumilast  250 mcg Oral Daily   sacubitril-valsartan  1 tablet Oral BID   sodium chloride flush  3 mL Intravenous Q12H   Continuous Infusions:  sodium chloride       LOS: 1 day    Time spent: 35 minutes    Alberteen Sam, MD [facility] 09/13/2019, 9:14 AM     Please page though AMION or Epic secure chat:  For [employer], Higher education careers adviser

## 2019-09-13 NOTE — ED Notes (Signed)
Pt bed soiled with urine. Pt had full linen change. Pt repositioned in the bed at this time.

## 2019-09-13 NOTE — ED Notes (Signed)
Attempted to call report to Tia, RN, she is unavailable with a patient she will return call

## 2019-09-13 NOTE — TOC Initial Note (Addendum)
Transition of Care [facility]) - Initial/Assessment Note    Patient Details  Name: Gregory Hall MRN: [id number] Date of Birth: 03-30-1955  Transition of Care [facility]) CM/SW Contact:    Delphia Grates, [place] Phone Number: 09/13/2019, 12:38 PM  Clinical Narrative:                  CSW contacted [facility] that patient is from and talked with Drinda Butts, Med Tech. Drinda Butts stated that at 12:30pm on 09/12/2019 that the patient was struggling to breath and his feet were really big and swelling. Drinda Butts stated that he is in the last stage of COVID and actively used his inhaler and ventilator. Drinda Butts stated that he is able to come back to the home once he is medically charged and asked for the updated FL2.  CSW spoke with patient who was on a BiPap. Patient reports wanting to go back to his assisted living placement and that he has been living there for the past 7 months.  CSW updated patient's FL2 and is awaiting co-sign signature from attending physician. CSW will send to placement once signed.  Patient goes to [facility] for his PCP but stated that he has been to see her yet.  Patient receives his medication from [facility] in [place], [place]. Patient reports not having any issues obtaining his medications.   Expected Discharge Plan: Assisted Living Barriers to Discharge: Continued Medical Work up   Patient Goals and CMS Choice Patient states their goals for this hospitalization and ongoing recovery are:: "Go back to group home" CMS Medicare.gov Compare Post Acute Care list provided to:: Patient Choice offered to / list presented to : Patient  Expected Discharge Plan and Services Expected Discharge Plan: Assisted Living     Post Acute Care Choice:  (Assisted Living Facility) Living arrangements for the past 2 months: Assisted Living Facility                                      Prior Living Arrangements/Services Living arrangements for the past 2  months: Assisted Living Facility Lives with:: Self, Facility Resident Patient language and need for interpreter reviewed:: Yes Do you feel safe going back to the place where you live?: Yes      Need for Family Participation in Patient Care: No (Comment) Care giver support system in place?: Yes (comment) ([facility]) Current home services: DME (wheelchair, bedside commode, oxygen) Criminal Activity/Legal Involvement Pertinent to Current Situation/Hospitalization: No - Comment as needed  Activities of Daily Living      Permission Sought/Granted Permission sought to share information with : Facility Medical sales representative                Emotional Assessment Appearance:: Appears stated age Attitude/Demeanor/Rapport: Engaged Affect (typically observed): Adaptable, Accepting, Appropriate Orientation: : Oriented to Self, Oriented to Place, Oriented to  Time Alcohol / Substance Use: Not Applicable Psych Involvement: No (comment)  Admission diagnosis:  Acute on chronic respiratory failure with hypoxia and hypercapnia (HCC) [Y86.57, J96.22] Patient Active Problem List   Diagnosis Date Noted  . Acute CHF (congestive heart failure) (HCC) 09/12/2019  . NSTEMI (non-ST elevated myocardial infarction) (HCC) 09/12/2019  . AKI (acute kidney injury) (HCC) 09/12/2019  . Diabetes mellitus type 2, uncontrolled, with complications (HCC) 06/10/2019  . Chronic pain syndrome 06/10/2019  . COPD exacerbation (HCC) 06/10/2019  . COPD with acute exacerbation (HCC)  06/09/2019  . CAD (coronary artery disease) 06/09/2019  . HTN (hypertension) 06/09/2019  . Acute on chronic respiratory failure with hypoxia and hypercapnia (HCC) 06/09/2019  . Anxiety 06/09/2019  . Chronic prescription opiate use 06/09/2019  . Hyperglycemia 06/09/2019  . Subclavian artery stenosis, left (HCC) 08/20/2014  . Rectus diastasis 07/19/2012  . Hernia   . Edema 05/10/2011  . Preoperative cardiovascular examination  10/06/2010  . SMOKER 11/25/2009  . CAROTID BRUIT, RIGHT 11/24/2009  . Chest pain 11/24/2009  . Hyperlipidemia 03/30/2009  . Coronary atherosclerosis 03/30/2009  . HYPERTENSION, HX OF 03/30/2009   PCP:  Armando Gang, FNP Pharmacy:   [facility] - [place], [place] - [address] [place] [address] Phone: [phone] Fax: [phone]     Social Determinants of Health (SDOH) Interventions    Readmission Risk Interventions No flowsheet data found.

## 2019-09-13 NOTE — ED Notes (Signed)
Patient has taken off Bi-Pap mask and is refusing to put mask back on. Nurse Secretary has paged Admitting MD at this time.

## 2019-09-13 NOTE — ED Notes (Signed)
Patient placed in recliner, he was able to move from bed to chair with one person assist. Patient stated he is in pain and needs pain medications, MD notified

## 2019-09-13 NOTE — Progress Notes (Signed)
OT Cancellation Note  Patient Details Name: Gregory Hall MRN: [id number] DOB: 19-Nov-1955   Cancelled Treatment:    Reason Eval/Treat Not Completed: Patient declined, no reason specified. OT order received and chart reviewed. Upon arrival, pt noted to be sleeping soundly on his side, not waking to light or voice. Will continue to follow and initiate services at later date/time as able.   Kathie Dike, M.S. OTR/L  09/13/19, 12:57 PM  ascom [phone]

## 2019-09-13 NOTE — [place] FL2 (Signed)
[place] MEDICAID FL2 LEVEL OF CARE SCREENING TOOL     IDENTIFICATION  Patient Name: Gregory Hall Birthdate: [birth year] Sex: male Admission Date (Current Location): 09/12/2019  [place] and [place] Number:  Gregory Hall [id number] R Facility and Address:  [facility], [address], [place], [place] [address]      Provider Number: [id number]  Attending Physician Name and Address:  Alberteen Sam, *  Relative Name and Phone Number:  Jimmye Norman - [phone]    Current Level of Care: Hospital Recommended Level of Care: Assisted Living Facility Prior Approval Number:    Date Approved/Denied: 08/13/16 PASRR Number: [id number]  Discharge Plan: Other (Comment) (ALF)    Current Diagnoses: Patient Active Problem List   Diagnosis Date Noted  . Acute CHF (congestive heart failure) (HCC) 09/12/2019  . NSTEMI (non-ST elevated myocardial infarction) (HCC) 09/12/2019  . AKI (acute kidney injury) (HCC) 09/12/2019  . Diabetes mellitus type 2, uncontrolled, with complications (HCC) 06/10/2019  . Chronic pain syndrome 06/10/2019  . COPD exacerbation (HCC) 06/10/2019  . COPD with acute exacerbation (HCC) 06/09/2019  . CAD (coronary artery disease) 06/09/2019  . HTN (hypertension) 06/09/2019  . Acute on chronic respiratory failure with hypoxia and hypercapnia (HCC) 06/09/2019  . Anxiety 06/09/2019  . Chronic prescription opiate use 06/09/2019  . Hyperglycemia 06/09/2019  . Subclavian artery stenosis, left (HCC) 08/20/2014  . Rectus diastasis 07/19/2012  . Hernia   . Edema 05/10/2011  . Preoperative cardiovascular examination 10/06/2010  . SMOKER 11/25/2009  . CAROTID BRUIT, RIGHT 11/24/2009  . Chest pain 11/24/2009  . Hyperlipidemia 03/30/2009  . Coronary atherosclerosis 03/30/2009  . HYPERTENSION, HX OF 03/30/2009    Orientation RESPIRATION BLADDER Height & Weight     Self, Time, Situation  Other (Comment) (BiPap) Continent Weight:  220 lb (99.8 kg) Height:  6\' 2"  (188 cm)  BEHAVIORAL SYMPTOMS/MOOD NEUROLOGICAL BOWEL NUTRITION STATUS      Continent Diet (Heart Healthy)  AMBULATORY STATUS COMMUNICATION OF NEEDS Skin   Independent Verbally Normal                       Personal Care Assistance Level of Assistance              Functional Limitations Info             SPECIAL CARE FACTORS FREQUENCY                       Contractures Contractures Info: Not present    Additional Factors Info  Code Status Code Status Info: FULL             Current Medications (09/13/2019):  This is the current hospital active medication list Current Facility-Administered Medications  Medication Dose Route Frequency Provider Last Rate Last Admin  . 0.9 %  sodium chloride infusion  250 mL Intravenous PRN Chotiner, 11/13/2019, MD      . aspirin EC tablet 81 mg  81 mg Oral Daily Chotiner, Claudean Severance, MD   81 mg at 09/13/19 0933  . atorvastatin (LIPITOR) tablet 80 mg  80 mg Oral QHS Chotiner, 11/13/19, MD      . doxycycline (VIBRA-TABS) tablet 100 mg  100 mg Oral Q12H Danford, Christopher P, MD      . fluticasone furoate-vilanterol (BREO ELLIPTA) 100-25 MCG/INH 1 puff  1 puff Inhalation Daily Danford, Claudean Severance, MD   1 puff at 09/13/19 0940   And  . umeclidinium bromide (INCRUSE  ELLIPTA) 62.5 MCG/INH 1 puff  1 puff Inhalation Daily Danford, Earl Lites, MD   1 puff at 09/13/19 0940  . furosemide (LASIX) injection 40 mg  40 mg Intravenous Q12H Chotiner, Claudean Severance, MD   40 mg at 09/13/19 0654  . insulin aspart (novoLOG) injection 0-5 Units  0-5 Units Subcutaneous QHS Chotiner, Claudean Severance, MD      . insulin aspart (novoLOG) injection 0-9 Units  0-9 Units Subcutaneous TID WC Chotiner, Claudean Severance, MD      . ipratropium-albuterol (DUONEB) 0.5-2.5 (3) MG/3ML nebulizer solution 3 mL  3 mL Nebulization Q2H PRN Danford, Earl Lites, MD      . ipratropium-albuterol (DUONEB) 0.5-2.5 (3) MG/3ML nebulizer solution 3 mL  3 mL  Nebulization Q6H Danford, Earl Lites, MD      . lamoTRIgine (LAMICTAL) tablet 100 mg  100 mg Oral Daily Chotiner, Claudean Severance, MD   100 mg at 09/13/19 0934  . methylPREDNISolone sodium succinate (SOLU-MEDROL) 40 mg/mL injection 40 mg  40 mg Intravenous Q6H Danford, Earl Lites, MD   40 mg at 09/13/19 0935   Followed by  . [START ON 09/14/2019] predniSONE (DELTASONE) tablet 40 mg  40 mg Oral Q breakfast Danford, Earl Lites, MD      . metoprolol tartrate (LOPRESSOR) tablet 12.5 mg  12.5 mg Oral BID Chotiner, Claudean Severance, MD   12.5 mg at 09/13/19 0935  . ondansetron (ZOFRAN) injection 4 mg  4 mg Intravenous Q6H PRN Chotiner, Claudean Severance, MD      . oxyCODONE (Oxy IR/ROXICODONE) immediate release tablet 10 mg  10 mg Oral TID PRN Alberteen Sam, MD   10 mg at 09/13/19 0933  . potassium chloride SA (KLOR-CON) CR tablet 20 mEq  20 mEq Oral Daily Chotiner, Claudean Severance, MD   20 mEq at 09/13/19 0943  . rivaroxaban (XARELTO) tablet 20 mg  20 mg Oral Q supper Chotiner, Claudean Severance, MD      . roflumilast (DALIRESP) tablet 250 mcg  250 mcg Oral Daily Chotiner, Claudean Severance, MD   250 mcg at 09/13/19 [phone]  . sacubitril-valsartan (ENTRESTO) 24-26 mg per tablet  1 tablet Oral BID Chotiner, Claudean Severance, MD      . sodium chloride flush (NS) 0.9 % injection 3 mL  3 mL Intravenous Q12H Chotiner, Claudean Severance, MD   3 mL at 09/13/19 0941  . sodium chloride flush (NS) 0.9 % injection 3 mL  3 mL Intravenous PRN Chotiner, Claudean Severance, MD       Current Outpatient Medications  Medication Sig Dispense Refill  . [START ON 09/16/2019] acetylcysteine (MUCOMYST) 20 % nebulizer solution Take 4 mLs by nebulization every 7 (seven) days.    Marland Kitchen aspirin EC 81 MG tablet Take 81 mg by mouth daily.    Marland Kitchen atorvastatin (LIPITOR) 20 MG tablet Take 60 mg by mouth at bedtime.     . baclofen (LIORESAL) 20 MG tablet Take 20 mg by mouth every 8 (eight) hours.    . Camphor-Menthol-Methyl Sal (SALONPAS) 3.01-15-08 % PTCH Apply 1 patch topically every 12  (twelve) hours as needed (pain).    . Cholecalciferol (VITAMIN D3) 50 MCG (2000 UT) TABS Take 2,000 Units by mouth daily.     . cyclobenzaprine (FLEXERIL) 5 MG tablet Take 1 tablet (5 mg total) by mouth 3 (three) times daily as needed. (Patient taking differently: Take 5 mg by mouth 2 (two) times daily as needed for muscle spasms. ) 15 tablet 0  . DULoxetine (CYMBALTA) 60 MG  capsule Take 60 mg by mouth daily.    . Fluticasone-Umeclidin-Vilant (TRELEGY ELLIPTA) 100-62.5-25 MCG/INH AEPB Inhale 1 puff into the lungs daily.    . furosemide (LASIX) 20 MG tablet Take 20 mg by mouth daily as needed for fluid.     Marland Kitchen guaiFENesin (MUCINEX) 600 MG 12 hr tablet Take 600 mg by mouth 2 (two) times daily.    . insulin glargine (LANTUS) 100 UNIT/ML injection Inject 22 Units into the skin at bedtime.     . insulin lispro (HUMALOG) 100 UNIT/ML injection Inject 4 Units into the skin 3 (three) times daily before meals. Hold for FSBG <100    . ipratropium-albuterol (DUONEB) 0.5-2.5 (3) MG/3ML SOLN Take 3 mLs by nebulization every 6 (six) hours as needed. 360 mL 0  . lactulose (CHRONULAC) 10 GM/15ML solution Take 20 g by mouth daily as needed for mild constipation.    Marland Kitchen lamoTRIgine (LAMICTAL) 100 MG tablet Take 100 mg by mouth daily.    Marland Kitchen lisinopril (ZESTRIL) 20 MG tablet Take 20 mg by mouth daily.    . metFORMIN (GLUCOPHAGE-XR) 750 MG 24 hr tablet Take 750 mg by mouth 2 (two) times daily.    . metoprolol tartrate (LOPRESSOR) 25 MG tablet Take 12.5 mg by mouth 2 (two) times daily.    Marland Kitchen NAC 600 MG CAPS Take 600 mg by mouth daily.    Marland Kitchen NITROSTAT 0.4 MG SL tablet DISSOLVE (1) TABLET UNDER TONGUE AS NEEDED TO RELIEVE CHEST PAIN. MAYREPEAT EVERY 5 MINUTES. (Patient taking differently: Place 0.4 mg under the tongue every 5 (five) minutes as needed for chest pain. ) 25 tablet 1  . ondansetron (ZOFRAN) 4 MG tablet Take 4 mg by mouth every 8 (eight) hours as needed for nausea or vomiting.     . Oxycodone HCl 10 MG TABS Take 10  mg by mouth 3 (three) times daily.    Marland Kitchen oxymetazoline (AFRIN) 0.05 % nasal spray Place 2 sprays into both nostrils 2 (two) times daily as needed for congestion.    . pantoprazole (PROTONIX) 40 MG tablet Take 40 mg by mouth daily.     . prednisoLONE 5 MG TABS tablet Take 1 tablet (5 mg total) by mouth daily. (Patient taking differently: Take 2.5 mg by mouth daily. ) 30 tablet 0  . pregabalin (LYRICA) 200 MG capsule Take 200 mg by mouth 3 (three) times daily.    . rivaroxaban (XARELTO) 20 MG TABS tablet Take 20 mg by mouth daily with supper.    . roflumilast (DALIRESP) 500 MCG TABS tablet Take 0.5 tablets (250 mcg total) by mouth daily. 30 tablet 0  . senna-docusate (SENOKOT-S) 8.6-50 MG tablet Take 1 tablet by mouth daily.     . traZODone (DESYREL) 100 MG tablet Take 100 mg by mouth at bedtime.    . Ensure Max Protein (ENSURE MAX PROTEIN) LIQD Take 330 mLs (11 oz total) by mouth 2 (two) times daily between meals. 3330 mL 0     Discharge Medications: Please see discharge summary for a list of discharge medications.  Relevant Imaging Results:  Relevant Lab Results:   Additional Information SS# [id number]  Delphia Grates, LCSW

## 2019-09-13 NOTE — ED Notes (Signed)
RT has been called 

## 2019-09-13 NOTE — ED Notes (Signed)
Patient on 3 L of oxygen

## 2019-09-13 NOTE — TOC Progression Note (Signed)
Transition of Care [facility]) - Progression Note    Patient Details  Name: Gregory Hall MRN: [id number] Date of Birth: 1955/05/25  [facility] CM/SW Contact  Delphia Grates, [place] Phone Number: 09/13/2019, 2:35 PM  Clinical Narrative:     CSW received secure chat from Ramona, MD stating that patient may be discharged tomorrow.  CSW contacted [facility] to let them know that patient will be discharged tomorrow and to get the fax number to send the FL2.  FL2 was faxed to [facility] at 2:25pm.   Expected Discharge Plan: Assisted Living Barriers to Discharge: Continued Medical Work up  Expected Discharge Plan and Services Expected Discharge Plan: Assisted Living     Post Acute Care Choice:  (Assisted Living Facility) Living arrangements for the past 2 months: Assisted Living Facility                                       Social Determinants of Health (SDOH) Interventions    Readmission Risk Interventions No flowsheet data found.

## 2019-09-13 NOTE — ED Notes (Signed)
Patient pulled off his BiPAP mask and refused to keep it on, patient placed on 3L of oxygen due to being on 2L his O2 sats were 86-89%

## 2019-09-14 ENCOUNTER — Inpatient Hospital Stay (HOSPITAL_COMMUNITY)
Admit: 2019-09-14 | Discharge: 2019-09-14 | Disposition: A | Discharge status: Home / Self Care | Payer: Medicare Other | Attending: Family Medicine | Admitting: Family Medicine

## 2019-09-14 ENCOUNTER — Other Ambulatory Visit: Payer: Self-pay

## 2019-09-14 DIAGNOSIS — I5033 Acute on chronic diastolic (congestive) heart failure: Secondary | ICD-10-CM

## 2019-09-14 LAB — ECHOCARDIOGRAM COMPLETE
AR max vel: 4.22 cm2
AV Peak grad: 7.1 mmHg
Ao pk vel: 1.33 m/s
Area-P 1/2: 2.1 cm2
Height: 74 in
Weight: 3520.31 oz

## 2019-09-14 LAB — CBC
HCT: 39.3 % (ref 39.0–52.0)
Hemoglobin: 13.4 g/dL (ref 13.0–17.0)
MCH: 28.8 pg (ref 26.0–34.0)
MCHC: 34.1 g/dL (ref 30.0–36.0)
MCV: 84.3 fL (ref 80.0–100.0)
Platelets: 310 10*3/uL (ref 150–400)
RBC: 4.66 MIL/uL (ref 4.22–5.81)
RDW: 16.6 % — ABNORMAL HIGH (ref 11.5–15.5)
WBC: 6.7 10*3/uL (ref 4.0–10.5)
nRBC: 0.3 % — ABNORMAL HIGH (ref 0.0–0.2)

## 2019-09-14 LAB — BASIC METABOLIC PANEL
Anion gap: 13 (ref 5–15)
BUN: 26 mg/dL — ABNORMAL HIGH (ref 8–23)
CO2: 38 mmol/L — ABNORMAL HIGH (ref 22–32)
Calcium: 8.8 mg/dL — ABNORMAL LOW (ref 8.9–10.3)
Chloride: 83 mmol/L — ABNORMAL LOW (ref 98–111)
Creatinine, Ser: 1.6 mg/dL — ABNORMAL HIGH (ref 0.61–1.24)
GFR calc Af Amer: 51 mL/min — ABNORMAL LOW (ref >60)
GFR calc non Af Amer: 44 mL/min — ABNORMAL LOW (ref >60)
Glucose, Bld: 230 mg/dL — ABNORMAL HIGH (ref 70–99)
Potassium: 4.5 mmol/L (ref 3.5–5.1)
Sodium: 134 mmol/L — ABNORMAL LOW (ref 135–145)

## 2019-09-14 LAB — GLUCOSE, CAPILLARY
Glucose-Capillary: 187 mg/dL — ABNORMAL HIGH (ref 70–99)
Glucose-Capillary: 287 mg/dL — ABNORMAL HIGH (ref 70–99)

## 2019-09-14 LAB — HIV ANTIBODY (ROUTINE TESTING W REFLEX): HIV Screen 4th Generation wRfx: NONREACTIVE

## 2019-09-14 MED ORDER — PERFLUTREN LIPID MICROSPHERE
1.0000 mL | INTRAVENOUS | Status: AC | PRN
  Administered 2019-09-14: 3 mL via INTRAVENOUS
  Filled 2019-09-14: qty 10

## 2019-09-14 MED ORDER — LISINOPRIL 20 MG PO TABS: 20.0000 mg | ORAL_TABLET | Freq: Every day | ORAL | Status: DC

## 2019-09-14 MED ORDER — OXYCODONE HCL 10 MG PO TABS
10.0000 mg | ORAL_TABLET | Freq: Three times a day (TID) | ORAL | 0 refills | Status: DC
Start: 2019-09-14 — End: 2020-04-09

## 2019-09-14 MED ORDER — METFORMIN HCL ER 750 MG PO TB24: 750.0000 mg | ORAL_TABLET | Freq: Two times a day (BID) | ORAL | Status: DC

## 2019-09-14 MED ORDER — PREGABALIN 200 MG PO CAPS: 200.0000 mg | ORAL_CAPSULE | Freq: Three times a day (TID) | ORAL | 0 refills | Status: AC

## 2019-09-14 MED ORDER — DOXYCYCLINE HYCLATE 100 MG PO TABS
100.0000 mg | ORAL_TABLET | Freq: Two times a day (BID) | ORAL | 0 refills | Status: DC
Start: 2019-09-14 — End: 2019-09-21

## 2019-09-14 MED ORDER — FUROSEMIDE 20 MG PO TABS
20.0000 mg | ORAL_TABLET | Freq: Every day | ORAL | Status: DC
Start: 2019-09-14 — End: 2019-10-27

## 2019-09-14 MED ORDER — PREDNISOLONE 5 MG PO TABS: 2.5000 mg | ORAL_TABLET | Freq: Every day | ORAL | Status: DC

## 2019-09-14 MED ORDER — PREDNISONE 20 MG PO TABS: 40.0000 mg | ORAL_TABLET | Freq: Every day | ORAL | 0 refills | Status: AC

## 2019-09-14 NOTE — Progress Notes (Signed)
PT Cancellation Note  Patient Details Name: Gregory Hall MRN: [id number] DOB: 1955-09-08   Cancelled Treatment:    Reason Eval/Treat Not Completed: PT screened, no needs identified, will sign off   Ezekiel Ina, PT DPT 09/14/2019, 12:13 PM

## 2019-09-14 NOTE — Discharge Summary (Signed)
Physician Discharge Summary  Gregory Hall WGN:[id number] DOB: 12-Jun-1955 DOA: 09/12/2019  PCP: Armando Gang, FNP  Admit date: 09/12/2019 Discharge date: 09/14/2019  Admitted From: SNF B&N Disposition:  B&N  Recommendations for Outpatient Follow-up:  1. Follow up with PCP in 1 week 2. Obtain BMP in 5 days 3. Take Lasix daily for 5 days, then resume PRN use 4. Take doxycycline and prednisone for few more days (see details below) then resume prednisolone 5. Dr. Clint Guy: Please confirm reason for use of Xarelto.  IF patient uses for CAD, please adjust dose.        Home Health: N/A  Equipment/Devices: Wheelchair  Discharge Condition: Fair  CODE STATUS: FULL Diet recommendation: Cardiac, diabetic     Brief/Interim Summary: Gregory Hall is a 64 y.o. M with COPD on home O2, CAD s/p PCI years ago, DM, chronic pain on daily opiates, and HTN who presented with somnolence and respiratory distress from his long-term care facility.  In the ER the patient was wheezy, chest x-ray with bibasilar opacities, leg swelling noted, BNP elevated.     PRINCIPAL HOSPITAL DIAGNOSIS: Acute on chronic diastolic CHF     Discharge Diagnoses:   Acute on chronic respiratory failure with hypoxia and hypercapnia due to COPD flare and CHF flare Patient presented with respiratory distress, decreased mentation due to hypercarbia, and hypoxia worse from his baseline, requiring BiPAP to keep SPO2 greater than 90%.  This appears to be due to a combination of COPD exacerbation and CHF flare.     Acute on chronic diastolic CHF Patient admitted and started on IV Lasix.  Cr stable, swelling resolved.  Discharged to continue daily lasix for 5 more days, then adjust by PCP.  Needs BMP in 1 week/5 days.     Acute exacerbation of COPD Patient admitted and started on steroids, scheduled bronchodilators, doxycycline.  Wheezing improved.  Mentation improved with BiPAP.  Patient is appropriately  on NIV at home, three times a day.  Given his opiate use and advanced lung failure, this is prudent.    Diabetes, type II, well controlled, insulin-dependent, without complication  Coronary disease, secondary prevention Hypertension  Chronic pain syndrome  AKI Baseline Cr 0.9.  Cr here 1.6.  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    Repeat BMP in 1 week    Chronic anticoagulation Patient is on blood thinner, unclear reason why I can find no collateral.  Patient think this is for "my hear attacks" but if so, this is incorrect dosage.  I suspect patient is incorrect, and he takes this for another reason.  Will defer to PCP to determine cause for Xarelto, and adjust dose if truly for CV prevention          Discharge Instructions  Discharge Instructions    Diet - low sodium heart healthy   Complete by: As directed    Discharge instructions   Complete by: As directed    From Dr. Maryfrances Bunnell: You were admitted for lung failure from CHF and COPD  For the CHF: WEIGH YOURSELF DAILY CALL MD FOR WEIGHT GAIN MORE THAN 3 lbs in a day  Take Lasix 20 mg daily for 5 more days Check BMP in 5 days    For COPD flare: Take doxycycline 100 mg twice daily for 4 more days Take prednisone 40 mg once daily for 3 more days to complete 5 days HOld prednisolone while taking prednisone, then resume on 9/8     Hold metformin and lisinopril until 9/7, then resume both  on 9/7  See primary MD in 1 week   Heart Failure patients record your daily weight using the same scale at the same time of day   Complete by: As directed    Increase activity slowly   Complete by: As directed    Increase activity slowly   Complete by: As directed      Allergies as of 09/14/2019      Reactions   Acetaminophen Other (See Comments)   Reaction:  Unknown  Kidney failure Told not to take from home M.D. Related to kidney and renal failure   Nsaids Other (See Comments)   Reaction:  Unknown  Kidney  failure Patient states not to take from home M.D. Related to kidney and renal failure. Other reaction(s): Unknown      Medication List    TAKE these medications   acetylcysteine 20 % nebulizer solution Commonly known as: MUCOMYST Take 4 mLs by nebulization every 7 (seven) days. Start taking on: September 16, 2019   aspirin EC 81 MG tablet Take 81 mg by mouth daily.   atorvastatin 20 MG tablet Commonly known as: LIPITOR Take 60 mg by mouth at bedtime.   baclofen 20 MG tablet Commonly known as: LIORESAL Take 20 mg by mouth every 8 (eight) hours.   cyclobenzaprine 5 MG tablet Commonly known as: FLEXERIL Take 1 tablet (5 mg total) by mouth 3 (three) times daily as needed. What changed:   when to take this  reasons to take this   doxycycline 100 MG tablet Commonly known as: VIBRA-TABS Take 1 tablet (100 mg total) by mouth every 12 (twelve) hours.   DULoxetine 60 MG capsule Commonly known as: CYMBALTA Take 60 mg by mouth daily.   Ensure Max Protein Liqd Take 330 mLs (11 oz total) by mouth 2 (two) times daily between meals.   furosemide 20 MG tablet Commonly known as: LASIX Take 1 tablet (20 mg total) by mouth daily. What changed:   when to take this  reasons to take this   guaiFENesin 600 MG 12 hr tablet Commonly known as: MUCINEX Take 600 mg by mouth 2 (two) times daily.   insulin glargine 100 UNIT/ML injection Commonly known as: LANTUS Inject 22 Units into the skin at bedtime.   insulin lispro 100 UNIT/ML injection Commonly known as: HUMALOG Inject 4 Units into the skin 3 (three) times daily before meals. Hold for FSBG <100   ipratropium-albuterol 0.5-2.5 (3) MG/3ML Soln Commonly known as: DUONEB Take 3 mLs by nebulization every 6 (six) hours as needed.   lactulose 10 GM/15ML solution Commonly known as: CHRONULAC Take 20 g by mouth daily as needed for mild constipation.   lamoTRIgine 100 MG tablet Commonly known as: LAMICTAL Take 100 mg by mouth  daily.   lisinopril 20 MG tablet Commonly known as: ZESTRIL Take 1 tablet (20 mg total) by mouth daily. Start taking on: September 16, 2019 What changed: These instructions start on September 16, 2019. If you are unsure what to do until then, ask your doctor or other care provider.   metFORMIN 750 MG 24 hr tablet Commonly known as: GLUCOPHAGE-XR Take 1 tablet (750 mg total) by mouth 2 (two) times daily. Start taking on: September 16, 2019 What changed: These instructions start on September 16, 2019. If you are unsure what to do until then, ask your doctor or other care provider.   metoprolol tartrate 25 MG tablet Commonly known as: LOPRESSOR Take 12.5 mg by mouth 2 (two) times daily.  NAC 600 MG Caps Generic drug: Acetylcysteine Take 600 mg by mouth daily.   Nitrostat 0.4 MG SL tablet Generic drug: nitroGLYCERIN DISSOLVE (1) TABLET UNDER TONGUE AS NEEDED TO RELIEVE CHEST PAIN. MAYREPEAT EVERY 5 MINUTES. What changed: See the new instructions.   ondansetron 4 MG tablet Commonly known as: ZOFRAN Take 4 mg by mouth every 8 (eight) hours as needed for nausea or vomiting.   Oxycodone HCl 10 MG Tabs Take 10 mg by mouth 3 (three) times daily.   oxymetazoline 0.05 % nasal spray Commonly known as: AFRIN Place 2 sprays into both nostrils 2 (two) times daily as needed for congestion.   pantoprazole 40 MG tablet Commonly known as: PROTONIX Take 40 mg by mouth daily.   prednisoLONE 5 MG Tabs tablet Take 0.5 tablets (2.5 mg total) by mouth daily. Start taking on: September 17, 2019 What changed:   how much to take  These instructions start on September 17, 2019. If you are unsure what to do until then, ask your doctor or other care provider.   predniSONE 20 MG tablet Commonly known as: DELTASONE Take 2 tablets (40 mg total) by mouth daily with breakfast for 3 days.   pregabalin 200 MG capsule Commonly known as: LYRICA Take 200 mg by mouth 3 (three) times daily.   rivaroxaban  20 MG Tabs tablet Commonly known as: XARELTO Take 20 mg by mouth daily with supper.   roflumilast 500 MCG Tabs tablet Commonly known as: DALIRESP Take 0.5 tablets (250 mcg total) by mouth daily.   Salonpas 3.01-15-08 % Ptch Generic drug: Camphor-Menthol-Methyl Sal Apply 1 patch topically every 12 (twelve) hours as needed (pain).   senna-docusate 8.6-50 MG tablet Commonly known as: Senokot-S Take 1 tablet by mouth daily.   traZODone 100 MG tablet Commonly known as: DESYREL Take 100 mg by mouth at bedtime.   Trelegy Ellipta 100-62.5-25 MCG/INH Aepb Generic drug: Fluticasone-Umeclidin-Vilant Inhale 1 puff into the lungs daily.   Vitamin D3 50 MCG (2000 UT) Tabs Take 2,000 Units by mouth daily.       Follow-up Information    [facility] Follow up on 09/26/2019.   Specialty: Cardiology Why: at 12:30pm. Enter through the [facility] entrance Contact information: [address] [place] [address] [phone]       Armando Gang, FNP. Schedule an appointment as soon as possible for a visit in 1 week(s).   Specialty: Family Medicine Contact information: [address] [place] [address] [phone]              Allergies  Allergen Reactions  . Acetaminophen Other (See Comments)    Reaction:  Unknown  Kidney failure Told not to take from home M.D. Related to kidney and renal failure   . Nsaids Other (See Comments)    Reaction:  Unknown  Kidney failure Patient states not to take from home M.D. Related to kidney and renal failure.  Other reaction(s): Unknown      Procedures/Studies: DG Chest Portable 1 View  Result Date: 09/12/2019 CLINICAL DATA:  Shortness of breath and hypoxia. COPD. "Evaluate infiltrate " EXAM: PORTABLE CHEST 1 VIEW COMPARISON:  Most recent radiograph 06/14/2019 FINDINGS: Patchy opacity at both lung bases. Background bronchial thickening and interstitial  coarsening. Stable heart size and mediastinal contours. Aortic atherosclerosis. Vascular stent in the region of the left subclavian. No pleural fluid or pneumothorax. Surgical hardware in the lower cervical spine partially included. Spinal stimulator in  place. The bones appear under mineralized. IMPRESSION: Patchy opacity at both lung bases, may represent atelectasis, pneumonia, or aspiration. This has increased from imaging obtained 3 months ago. Electronically Signed   By: Narda Rutherford M.D.   On: 09/12/2019 21:28       Subjective: Patient feeling well.  Swelling resolved.  Breathing at baseline.  No confusion, no fever.  Discharge Exam: Vitals:   09/14/19 0438 09/14/19 0558  BP: 93/60   Pulse: 91   Resp: 18   Temp: 98.6 F (37 C)   SpO2: (!) 89% 92%   Vitals:   09/13/19 2038 09/14/19 0438 09/14/19 0500 09/14/19 0558  BP: 134/75 93/60    Pulse: 95 91    Resp:  18    Temp: 98 F (36.7 C) 98.6 F (37 C)    TempSrc: Oral Oral    SpO2: 93% (!) 89%  92%  Weight:   99.8 kg   Height:        General: Pt is alert, awake, not in acute distress Cardiovascular: RRR, nl S1-S2, no murmurs appreciated.   No LE edema.   Respiratory: Normal respiratory rate and rhythm.  Diminshed, some wheezing scant, no rales.. Abdominal: Abdomen soft and non-tender.  No distension or HSM.   Neuro/Psych: Strength symmetric in upper and lower extremities.  Judgment and insight appear moderately impaired by dementia.   The results of significant diagnostics from this hospitalization (including imaging, microbiology, ancillary and laboratory) are listed below for reference.     Microbiology: Recent Results (from the past 240 hour(s))  SARS Coronavirus 2 by RT PCR (hospital order, performed in [facility] hospital lab) Nasopharyngeal Nasopharyngeal Swab     Status: None   Collection Time: 09/12/19  8:43 PM   Specimen: Nasopharyngeal Swab  Result Value Ref Range Status   SARS Coronavirus 2 NEGATIVE  NEGATIVE Final    Comment: (NOTE) SARS-CoV-2 target nucleic acids are NOT DETECTED.  The SARS-CoV-2 RNA is generally detectable in upper and lower respiratory specimens during the acute phase of infection. The lowest concentration of SARS-CoV-2 viral copies this assay can detect is 250 copies / mL. A negative result does not preclude SARS-CoV-2 infection and should not be used as the sole basis for treatment or other patient management decisions.  A negative result may occur with improper specimen collection / handling, submission of specimen other than nasopharyngeal swab, presence of viral mutation(s) within the areas targeted by this assay, and inadequate number of viral copies (<250 copies / mL). A negative result must be combined with clinical observations, patient history, and epidemiological information.  Fact Sheet for Patients:   BoilerBrush.com.cy  Fact Sheet for Healthcare Providers: https://pope.com/  This test is not yet approved or  cleared by the [place] FDA and has been authorized for detection and/or diagnosis of SARS-CoV-2 by FDA under an Emergency Use Authorization (EUA).  This EUA will remain in effect (meaning this test can be used) for the duration of the COVID-19 declaration under Section 564(b)(1) of the Act, 21 U.S.C. section 360bbb-3(b)(1), unless the authorization is terminated or revoked sooner.  Performed at [facility], [address] [place], [place] [address]      Labs: BNP (last 3 results) Recent Labs    06/09/19 2119 09/12/19 2043  BNP 51.5 458.1*   Basic Metabolic Panel: Recent Labs  Lab 09/12/19 2043 09/13/19 0646 09/14/19 0615  NA 133* 134* 134*  K 4.8 4.1 4.5  CL 87* 88* 83*  CO2 33*  38* 38*  GLUCOSE 103* 92 230*  BUN 25* 22 26*  CREATININE 1.61* 1.57* 1.60*  CALCIUM 8.4* 8.4* 8.8*  MG 1.6*  --   --    Liver Function Tests: Recent Labs  Lab 09/12/19 2043   AST 18  ALT 16  ALKPHOS 92  BILITOT 0.7  PROT 6.5  ALBUMIN 3.5   No results for input(s): LIPASE, AMYLASE in the last 168 hours. No results for input(s): AMMONIA in the last 168 hours. CBC: Recent Labs  Lab 09/12/19 2043 09/14/19 0615  WBC 8.1 6.7  NEUTROABS 5.6  --   HGB 12.3* 13.4  HCT 38.2* 39.3  MCV 89.9 84.3  PLT 258 310   Cardiac Enzymes: No results for input(s): CKTOTAL, CKMB, CKMBINDEX, TROPONINI in the last 168 hours. BNP: Invalid input(s): POCBNP CBG: Recent Labs  Lab 09/13/19 0927 09/13/19 1315 09/13/19 1811 09/13/19 2101 09/14/19 0757  GLUCAP 106* 160* 149* 207* 187*   D-Dimer No results for input(s): DDIMER in the last 72 hours. Hgb A1c Recent Labs    09/13/19 0646  HGBA1C 7.0*   Lipid Profile No results for input(s): CHOL, HDL, LDLCALC, TRIG, CHOLHDL, LDLDIRECT in the last 72 hours. Thyroid function studies No results for input(s): TSH, T4TOTAL, T3FREE, THYROIDAB in the last 72 hours.  Invalid input(s): FREET3 Anemia work up No results for input(s): VITAMINB12, FOLATE, FERRITIN, TIBC, IRON, RETICCTPCT in the last 72 hours. Urinalysis    Component Value Date/Time   COLORURINE YELLOW (A) 11/10/2015 1709   APPEARANCEUR CLEAR (A) 11/10/2015 1709   APPEARANCEUR Clear 10/28/2013 2215   LABSPEC 1.016 11/10/2015 1709   LABSPEC 1.002 10/28/2013 2215   PHURINE 6.0 11/10/2015 1709   GLUCOSEU NEGATIVE 11/10/2015 1709   GLUCOSEU Negative 10/28/2013 2215   HGBUR NEGATIVE 11/10/2015 1709   BILIRUBINUR NEGATIVE 11/10/2015 1709   BILIRUBINUR Negative 10/28/2013 2215   KETONESUR TRACE (A) 11/10/2015 1709   PROTEINUR NEGATIVE 11/10/2015 1709   UROBILINOGEN 1.0 03/29/2009 0840   NITRITE NEGATIVE 11/10/2015 1709   LEUKOCYTESUR NEGATIVE 11/10/2015 1709   LEUKOCYTESUR Negative 10/28/2013 2215   Sepsis Labs Invalid input(s): PROCALCITONIN,  WBC,  LACTICIDVEN Microbiology Recent Results (from the past 240 hour(s))  SARS Coronavirus 2 by RT PCR  (hospital order, performed in [facility] hospital lab) Nasopharyngeal Nasopharyngeal Swab     Status: None   Collection Time: 09/12/19  8:43 PM   Specimen: Nasopharyngeal Swab  Result Value Ref Range Status   SARS Coronavirus 2 NEGATIVE NEGATIVE Final    Comment: (NOTE) SARS-CoV-2 target nucleic acids are NOT DETECTED.  The SARS-CoV-2 RNA is generally detectable in upper and lower respiratory specimens during the acute phase of infection. The lowest concentration of SARS-CoV-2 viral copies this assay can detect is 250 copies / mL. A negative result does not preclude SARS-CoV-2 infection and should not be used as the sole basis for treatment or other patient management decisions.  A negative result may occur with improper specimen collection / handling, submission of specimen other than nasopharyngeal swab, presence of viral mutation(s) within the areas targeted by this assay, and inadequate number of viral copies (<250 copies / mL). A negative result must be combined with clinical observations, patient history, and epidemiological information.  Fact Sheet for Patients:   BoilerBrush.com.cy  Fact Sheet for Healthcare Providers: https://pope.com/  This test is not yet approved or  cleared by the [place] FDA and has been authorized for detection and/or diagnosis of SARS-CoV-2 by FDA under an Emergency Use Authorization (EUA).  This EUA will remain in effect (meaning this test can be used) for the duration of the COVID-19 declaration under Section 564(b)(1) of the Act, 21 U.S.C. section 360bbb-3(b)(1), unless the authorization is terminated or revoked sooner.  Performed at [facility], [address] [place], [place] [address]      Time coordinating discharge: 35 minutes The [place] controlled substances registry was reviewed for this patient prior to filling the <5 days supply controlled substances  script.      SIGNED:   Alberteen Sam, MD  [facility] 09/14/2019, 9:53 AM

## 2019-09-14 NOTE — Progress Notes (Signed)
OT Cancellation Note  Patient Details Name: Gregory Hall MRN: [id number] DOB: Jul 18, 1955   Cancelled Treatment:    Reason Eval/Treat Not Completed: OT screened, no needs identified, will sign off. Upon arrival pt states he is d/c today, feels back to baseline, denies falls hx and HH therapy needs. Pt reports"95% of the time" he uses w/c and self propels, uses RW for bathroom. Pt is eager to return to ALF and answers safety awareness questions appropriately. Pt rises easily to standing using RW, demonstrates good dynamic balance, and easily achieves LB access in sitting. No skilled acute OT needs identified at this time. Please re-consult if new needs arise.   Kathie Dike, M.S. OTR/L  09/14/19, 12:33 PM  ascom [phone]

## 2019-09-14 NOTE — Progress Notes (Signed)
Gregory Hall  A and O x 4 VSS. Pt tolerating diet well. No complaints of pain or nausea. IV removed intact, prescriptions given. Pt voices understanding of discharge instructions with no further questions. Pt discharged via wheelchair with axillary.   Allergies as of 09/14/2019      Reactions   Acetaminophen Other (See Comments)   Reaction:  Unknown  Kidney failure Told not to take from home M.D. Related to kidney and renal failure   Nsaids Other (See Comments)   Reaction:  Unknown  Kidney failure Patient states not to take from home M.D. Related to kidney and renal failure. Other reaction(s): Unknown      Medication List    TAKE these medications   acetylcysteine 20 % nebulizer solution Commonly known as: MUCOMYST Take 4 mLs by nebulization every 7 (seven) days. Start taking on: September 16, 2019   aspirin EC 81 MG tablet Take 81 mg by mouth daily.   atorvastatin 20 MG tablet Commonly known as: LIPITOR Take 60 mg by mouth at bedtime.   baclofen 20 MG tablet Commonly known as: LIORESAL Take 20 mg by mouth every 8 (eight) hours.   cyclobenzaprine 5 MG tablet Commonly known as: FLEXERIL Take 1 tablet (5 mg total) by mouth 3 (three) times daily as needed. What changed:   when to take this  reasons to take this   doxycycline 100 MG tablet Commonly known as: VIBRA-TABS Take 1 tablet (100 mg total) by mouth every 12 (twelve) hours.   DULoxetine 60 MG capsule Commonly known as: CYMBALTA Take 60 mg by mouth daily.   Ensure Max Protein Liqd Take 330 mLs (11 oz total) by mouth 2 (two) times daily between meals.   furosemide 20 MG tablet Commonly known as: LASIX Take 1 tablet (20 mg total) by mouth daily. What changed:   when to take this  reasons to take this   guaiFENesin 600 MG 12 hr tablet Commonly known as: MUCINEX Take 600 mg by mouth 2 (two) times daily.   insulin glargine 100 UNIT/ML injection Commonly known as: LANTUS Inject 22 Units into the  skin at bedtime.   insulin lispro 100 UNIT/ML injection Commonly known as: HUMALOG Inject 4 Units into the skin 3 (three) times daily before meals. Hold for FSBG <100   ipratropium-albuterol 0.5-2.5 (3) MG/3ML Soln Commonly known as: DUONEB Take 3 mLs by nebulization every 6 (six) hours as needed.   lactulose 10 GM/15ML solution Commonly known as: CHRONULAC Take 20 g by mouth daily as needed for mild constipation.   lamoTRIgine 100 MG tablet Commonly known as: LAMICTAL Take 100 mg by mouth daily.   lisinopril 20 MG tablet Commonly known as: ZESTRIL Take 1 tablet (20 mg total) by mouth daily. Start taking on: September 16, 2019 What changed: These instructions start on September 16, 2019. If you are unsure what to do until then, ask your doctor or other care provider.   metFORMIN 750 MG 24 hr tablet Commonly known as: GLUCOPHAGE-XR Take 1 tablet (750 mg total) by mouth 2 (two) times daily. Start taking on: September 16, 2019 What changed: These instructions start on September 16, 2019. If you are unsure what to do until then, ask your doctor or other care provider.   metoprolol tartrate 25 MG tablet Commonly known as: LOPRESSOR Take 12.5 mg by mouth 2 (two) times daily.   NAC 600 MG Caps Generic drug: Acetylcysteine Take 600 mg by mouth daily.   Nitrostat 0.4 MG SL tablet  Generic drug: nitroGLYCERIN DISSOLVE (1) TABLET UNDER TONGUE AS NEEDED TO RELIEVE CHEST PAIN. MAYREPEAT EVERY 5 MINUTES. What changed: See the new instructions.   ondansetron 4 MG tablet Commonly known as: ZOFRAN Take 4 mg by mouth every 8 (eight) hours as needed for nausea or vomiting.   Oxycodone HCl 10 MG Tabs Take 1 tablet (10 mg total) by mouth 3 (three) times daily.   oxymetazoline 0.05 % nasal spray Commonly known as: AFRIN Place 2 sprays into both nostrils 2 (two) times daily as needed for congestion.   pantoprazole 40 MG tablet Commonly known as: PROTONIX Take 40 mg by mouth daily.    prednisoLONE 5 MG Tabs tablet Take 0.5 tablets (2.5 mg total) by mouth daily. Start taking on: September 17, 2019 What changed:   how much to take  These instructions start on September 17, 2019. If you are unsure what to do until then, ask your doctor or other care provider.   predniSONE 20 MG tablet Commonly known as: DELTASONE Take 2 tablets (40 mg total) by mouth daily with breakfast for 3 days.   pregabalin 200 MG capsule Commonly known as: LYRICA Take 1 capsule (200 mg total) by mouth 3 (three) times daily.   rivaroxaban 20 MG Tabs tablet Commonly known as: XARELTO Take 20 mg by mouth daily with supper.   roflumilast 500 MCG Tabs tablet Commonly known as: DALIRESP Take 0.5 tablets (250 mcg total) by mouth daily.   Salonpas 3.01-15-08 % Ptch Generic drug: Camphor-Menthol-Methyl Sal Apply 1 patch topically every 12 (twelve) hours as needed (pain).   senna-docusate 8.6-50 MG tablet Commonly known as: Senokot-S Take 1 tablet by mouth daily.   traZODone 100 MG tablet Commonly known as: DESYREL Take 100 mg by mouth at bedtime.   Trelegy Ellipta 100-62.5-25 MCG/INH Aepb Generic drug: Fluticasone-Umeclidin-Vilant Inhale 1 puff into the lungs daily.   Vitamin D3 50 MCG (2000 UT) Tabs Take 2,000 Units by mouth daily.       Vitals:   09/14/19 0558 09/14/19 0751  BP:    Pulse:    Resp:    Temp:    SpO2: 92% 92%    Gregory Hall Gregory Hall

## 2019-09-14 NOTE — Progress Notes (Signed)
OT Cancellation Note  Patient Details Name: Gregory Hall MRN: [id number] DOB: [birth year]   Cancelled Treatment:    Reason Eval/Treat Not Completed: Patient declined, no reason specified. Pt snoring upon arrival not waking to voice greeting him however awakes easily to statement that his breakfast is in room. Pt states it is too early to work and is agreeable to attempt c therapy later this date. Will re-attempt as available.   Kathie Dike, M.S. OTR/L  09/14/19, 9:33 AM  ascom [phone]

## 2019-09-18 ENCOUNTER — Telehealth: Payer: Self-pay | Admitting: Family

## 2019-09-18 NOTE — Telephone Encounter (Signed)
Unable to reach patient regarding his new patient CHF Clinic appointment for 9/17 at 1230pm that was made after his recent hospital discharge.   Sabryn Preslar,NT

## 2019-09-21 ENCOUNTER — Other Ambulatory Visit: Payer: Self-pay

## 2019-09-21 ENCOUNTER — Emergency Department: Payer: Medicare Other

## 2019-09-21 ENCOUNTER — Inpatient Hospital Stay
Admission: EM | Admit: 2019-09-21 | Discharge: 2019-09-28 | DRG: 871 | Disposition: A | Discharge status: Home / Self Care | Payer: Medicare Other | Attending: Internal Medicine | Admitting: Internal Medicine

## 2019-09-21 DIAGNOSIS — E860 Dehydration: Secondary | ICD-10-CM | POA: Diagnosis present

## 2019-09-21 DIAGNOSIS — Z66 Do not resuscitate: Secondary | ICD-10-CM | POA: Diagnosis not present

## 2019-09-21 DIAGNOSIS — R570 Cardiogenic shock: Secondary | ICD-10-CM | POA: Diagnosis present

## 2019-09-21 DIAGNOSIS — R739 Hyperglycemia, unspecified: Secondary | ICD-10-CM

## 2019-09-21 DIAGNOSIS — Z8614 Personal history of Methicillin resistant Staphylococcus aureus infection: Secondary | ICD-10-CM

## 2019-09-21 DIAGNOSIS — I358 Other nonrheumatic aortic valve disorders: Secondary | ICD-10-CM | POA: Diagnosis present

## 2019-09-21 DIAGNOSIS — J9622 Acute and chronic respiratory failure with hypercapnia: Secondary | ICD-10-CM | POA: Diagnosis present

## 2019-09-21 DIAGNOSIS — N171 Acute kidney failure with acute cortical necrosis: Secondary | ICD-10-CM | POA: Diagnosis not present

## 2019-09-21 DIAGNOSIS — R630 Anorexia: Secondary | ICD-10-CM | POA: Diagnosis present

## 2019-09-21 DIAGNOSIS — F329 Major depressive disorder, single episode, unspecified: Secondary | ICD-10-CM | POA: Diagnosis present

## 2019-09-21 DIAGNOSIS — G9340 Encephalopathy, unspecified: Secondary | ICD-10-CM

## 2019-09-21 DIAGNOSIS — J449 Chronic obstructive pulmonary disease, unspecified: Secondary | ICD-10-CM | POA: Diagnosis not present

## 2019-09-21 DIAGNOSIS — F17210 Nicotine dependence, cigarettes, uncomplicated: Secondary | ICD-10-CM | POA: Diagnosis present

## 2019-09-21 DIAGNOSIS — I110 Hypertensive heart disease with heart failure: Secondary | ICD-10-CM | POA: Diagnosis present

## 2019-09-21 DIAGNOSIS — E119 Type 2 diabetes mellitus without complications: Secondary | ICD-10-CM | POA: Diagnosis not present

## 2019-09-21 DIAGNOSIS — N179 Acute kidney failure, unspecified: Secondary | ICD-10-CM

## 2019-09-21 DIAGNOSIS — I5031 Acute diastolic (congestive) heart failure: Secondary | ICD-10-CM | POA: Insufficient documentation

## 2019-09-21 DIAGNOSIS — Z20822 Contact with and (suspected) exposure to covid-19: Secondary | ICD-10-CM | POA: Diagnosis present

## 2019-09-21 DIAGNOSIS — F419 Anxiety disorder, unspecified: Secondary | ICD-10-CM | POA: Diagnosis present

## 2019-09-21 DIAGNOSIS — E785 Hyperlipidemia, unspecified: Secondary | ICD-10-CM | POA: Diagnosis present

## 2019-09-21 DIAGNOSIS — Z7951 Long term (current) use of inhaled steroids: Secondary | ICD-10-CM

## 2019-09-21 DIAGNOSIS — Z794 Long term (current) use of insulin: Secondary | ICD-10-CM | POA: Diagnosis not present

## 2019-09-21 DIAGNOSIS — J432 Centrilobular emphysema: Secondary | ICD-10-CM | POA: Diagnosis not present

## 2019-09-21 DIAGNOSIS — Z515 Encounter for palliative care: Secondary | ICD-10-CM | POA: Diagnosis not present

## 2019-09-21 DIAGNOSIS — I484 Atypical atrial flutter: Secondary | ICD-10-CM | POA: Diagnosis not present

## 2019-09-21 DIAGNOSIS — Z7901 Long term (current) use of anticoagulants: Secondary | ICD-10-CM

## 2019-09-21 DIAGNOSIS — A419 Sepsis, unspecified organism: Principal | ICD-10-CM | POA: Diagnosis present

## 2019-09-21 DIAGNOSIS — G9341 Metabolic encephalopathy: Secondary | ICD-10-CM | POA: Diagnosis not present

## 2019-09-21 DIAGNOSIS — J9621 Acute and chronic respiratory failure with hypoxia: Secondary | ICD-10-CM | POA: Diagnosis present

## 2019-09-21 DIAGNOSIS — E1142 Type 2 diabetes mellitus with diabetic polyneuropathy: Secondary | ICD-10-CM | POA: Diagnosis present

## 2019-09-21 DIAGNOSIS — J441 Chronic obstructive pulmonary disease with (acute) exacerbation: Secondary | ICD-10-CM | POA: Diagnosis present

## 2019-09-21 DIAGNOSIS — I5033 Acute on chronic diastolic (congestive) heart failure: Secondary | ICD-10-CM | POA: Diagnosis present

## 2019-09-21 DIAGNOSIS — I4891 Unspecified atrial fibrillation: Secondary | ICD-10-CM | POA: Diagnosis present

## 2019-09-21 DIAGNOSIS — I509 Heart failure, unspecified: Secondary | ICD-10-CM | POA: Diagnosis not present

## 2019-09-21 DIAGNOSIS — R0602 Shortness of breath: Secondary | ICD-10-CM | POA: Diagnosis not present

## 2019-09-21 DIAGNOSIS — G8929 Other chronic pain: Secondary | ICD-10-CM

## 2019-09-21 DIAGNOSIS — Z8701 Personal history of pneumonia (recurrent): Secondary | ICD-10-CM

## 2019-09-21 DIAGNOSIS — Z9981 Dependence on supplemental oxygen: Secondary | ICD-10-CM | POA: Diagnosis not present

## 2019-09-21 DIAGNOSIS — Z7189 Other specified counseling: Secondary | ICD-10-CM | POA: Diagnosis not present

## 2019-09-21 DIAGNOSIS — E875 Hyperkalemia: Secondary | ICD-10-CM

## 2019-09-21 DIAGNOSIS — I4892 Unspecified atrial flutter: Secondary | ICD-10-CM | POA: Diagnosis present

## 2019-09-21 DIAGNOSIS — G894 Chronic pain syndrome: Secondary | ICD-10-CM

## 2019-09-21 DIAGNOSIS — K219 Gastro-esophageal reflux disease without esophagitis: Secondary | ICD-10-CM | POA: Diagnosis present

## 2019-09-21 DIAGNOSIS — E872 Acidosis: Secondary | ICD-10-CM | POA: Diagnosis present

## 2019-09-21 DIAGNOSIS — D638 Anemia in other chronic diseases classified elsewhere: Secondary | ICD-10-CM | POA: Diagnosis present

## 2019-09-21 DIAGNOSIS — I25118 Atherosclerotic heart disease of native coronary artery with other forms of angina pectoris: Secondary | ICD-10-CM | POA: Diagnosis present

## 2019-09-21 DIAGNOSIS — Z79891 Long term (current) use of opiate analgesic: Secondary | ICD-10-CM

## 2019-09-21 DIAGNOSIS — R6521 Severe sepsis with septic shock: Secondary | ICD-10-CM | POA: Diagnosis present

## 2019-09-21 DIAGNOSIS — Z955 Presence of coronary angioplasty implant and graft: Secondary | ICD-10-CM | POA: Diagnosis not present

## 2019-09-21 DIAGNOSIS — R0603 Acute respiratory distress: Secondary | ICD-10-CM | POA: Diagnosis not present

## 2019-09-21 DIAGNOSIS — J9601 Acute respiratory failure with hypoxia: Secondary | ICD-10-CM | POA: Diagnosis not present

## 2019-09-21 DIAGNOSIS — Z79899 Other long term (current) drug therapy: Secondary | ICD-10-CM

## 2019-09-21 DIAGNOSIS — I483 Typical atrial flutter: Secondary | ICD-10-CM | POA: Diagnosis not present

## 2019-09-21 DIAGNOSIS — Z886 Allergy status to analgesic agent status: Secondary | ICD-10-CM

## 2019-09-21 DIAGNOSIS — I2510 Atherosclerotic heart disease of native coronary artery without angina pectoris: Secondary | ICD-10-CM | POA: Diagnosis present

## 2019-09-21 DIAGNOSIS — Z7982 Long term (current) use of aspirin: Secondary | ICD-10-CM

## 2019-09-21 DIAGNOSIS — N170 Acute kidney failure with tubular necrosis: Secondary | ICD-10-CM | POA: Diagnosis not present

## 2019-09-21 DIAGNOSIS — R404 Transient alteration of awareness: Secondary | ICD-10-CM | POA: Diagnosis present

## 2019-09-21 DIAGNOSIS — I480 Paroxysmal atrial fibrillation: Secondary | ICD-10-CM

## 2019-09-21 LAB — BLOOD GAS, ARTERIAL
Acid-Base Excess: 1.1 mmol/L (ref 0.0–2.0)
Bicarbonate: 28.5 mmol/L — ABNORMAL HIGH (ref 20.0–28.0)
FIO2: 0.5
MECHVT: 500 mL
Mechanical Rate: 18
O2 Saturation: 90.8 %
PEEP: 5 cmH2O
Patient temperature: 37
RATE: 18 resp/min
pCO2 arterial: 58 mmHg — ABNORMAL HIGH (ref 32.0–48.0)
pH, Arterial: 7.3 — ABNORMAL LOW (ref 7.350–7.450)
pO2, Arterial: 67 mmHg — ABNORMAL LOW (ref 83.0–108.0)

## 2019-09-21 LAB — CBC WITH DIFFERENTIAL/PLATELET
Abs Immature Granulocytes: 0.05 10*3/uL (ref 0.00–0.07)
Basophils Absolute: 0 10*3/uL (ref 0.0–0.1)
Basophils Relative: 0 %
Eosinophils Absolute: 0 10*3/uL (ref 0.0–0.5)
Eosinophils Relative: 0 %
HCT: 35.5 % — ABNORMAL LOW (ref 39.0–52.0)
Hemoglobin: 11.5 g/dL — ABNORMAL LOW (ref 13.0–17.0)
Immature Granulocytes: 0 %
Lymphocytes Relative: 5 %
Lymphs Abs: 0.6 10*3/uL — ABNORMAL LOW (ref 0.7–4.0)
MCH: 27.8 pg (ref 26.0–34.0)
MCHC: 32.4 g/dL (ref 30.0–36.0)
MCV: 86 fL (ref 80.0–100.0)
Monocytes Absolute: 1.4 10*3/uL — ABNORMAL HIGH (ref 0.1–1.0)
Monocytes Relative: 12 %
Neutro Abs: 9.6 10*3/uL — ABNORMAL HIGH (ref 1.7–7.7)
Neutrophils Relative %: 83 %
Platelets: 328 10*3/uL (ref 150–400)
RBC: 4.13 MIL/uL — ABNORMAL LOW (ref 4.22–5.81)
RDW: 17.9 % — ABNORMAL HIGH (ref 11.5–15.5)
WBC: 11.7 10*3/uL — ABNORMAL HIGH (ref 4.0–10.5)
nRBC: 0.2 % (ref 0.0–0.2)

## 2019-09-21 LAB — APTT: aPTT: 33 seconds (ref 24–36)

## 2019-09-21 LAB — SARS CORONAVIRUS 2 BY RT PCR (HOSPITAL ORDER, PERFORMED IN ~~LOC~~ HOSPITAL LAB): SARS Coronavirus 2: NEGATIVE

## 2019-09-21 LAB — BLOOD GAS, VENOUS
Acid-Base Excess: 3.7 mmol/L — ABNORMAL HIGH (ref 0.0–2.0)
Bicarbonate: 33 mmol/L — ABNORMAL HIGH (ref 20.0–28.0)
O2 Saturation: 71.8 %
Patient temperature: 37
pCO2, Ven: 77 mmHg (ref 44.0–60.0)
pH, Ven: 7.24 — ABNORMAL LOW (ref 7.250–7.430)
pO2, Ven: 45 mmHg (ref 32.0–45.0)

## 2019-09-21 LAB — URINALYSIS, COMPLETE (UACMP) WITH MICROSCOPIC
Bilirubin Urine: NEGATIVE
Glucose, UA: NEGATIVE mg/dL
Hgb urine dipstick: NEGATIVE
Ketones, ur: NEGATIVE mg/dL
Leukocytes,Ua: NEGATIVE
Nitrite: NEGATIVE
Protein, ur: NEGATIVE mg/dL
Specific Gravity, Urine: 1.016 (ref 1.005–1.030)
pH: 5 (ref 5.0–8.0)

## 2019-09-21 LAB — COMPREHENSIVE METABOLIC PANEL
ALT: 18 U/L (ref 0–44)
AST: 24 U/L (ref 15–41)
Albumin: 3.1 g/dL — ABNORMAL LOW (ref 3.5–5.0)
Alkaline Phosphatase: 79 U/L (ref 38–126)
Anion gap: 13 (ref 5–15)
BUN: 127 mg/dL — ABNORMAL HIGH (ref 8–23)
CO2: 34 mmol/L — ABNORMAL HIGH (ref 22–32)
Calcium: 8 mg/dL — ABNORMAL LOW (ref 8.9–10.3)
Chloride: 82 mmol/L — ABNORMAL LOW (ref 98–111)
Creatinine, Ser: 7.07 mg/dL — ABNORMAL HIGH (ref 0.61–1.24)
GFR calc Af Amer: 9 mL/min — ABNORMAL LOW (ref >60)
GFR calc non Af Amer: 7 mL/min — ABNORMAL LOW (ref >60)
Glucose, Bld: 142 mg/dL — ABNORMAL HIGH (ref 70–99)
Potassium: 7 mmol/L (ref 3.5–5.1)
Sodium: 129 mmol/L — ABNORMAL LOW (ref 135–145)
Total Bilirubin: 0.9 mg/dL (ref 0.3–1.2)
Total Protein: 6.1 g/dL — ABNORMAL LOW (ref 6.5–8.1)

## 2019-09-21 LAB — URINE DRUG SCREEN, QUALITATIVE (ARMC ONLY)
Amphetamines, Ur Screen: NOT DETECTED
Barbiturates, Ur Screen: NOT DETECTED
Benzodiazepine, Ur Scrn: NOT DETECTED
Cannabinoid 50 Ng, Ur ~~LOC~~: NOT DETECTED
Cocaine Metabolite,Ur ~~LOC~~: NOT DETECTED
MDMA (Ecstasy)Ur Screen: NOT DETECTED
Methadone Scn, Ur: NOT DETECTED
Opiate, Ur Screen: POSITIVE — AB
Phencyclidine (PCP) Ur S: NOT DETECTED
Tricyclic, Ur Screen: NOT DETECTED

## 2019-09-21 LAB — GLUCOSE, CAPILLARY
Glucose-Capillary: 147 mg/dL — ABNORMAL HIGH (ref 70–99)
Glucose-Capillary: 152 mg/dL — ABNORMAL HIGH (ref 70–99)

## 2019-09-21 LAB — TROPONIN I (HIGH SENSITIVITY)
Troponin I (High Sensitivity): 29 ng/L — ABNORMAL HIGH (ref <18)
Troponin I (High Sensitivity): 21 ng/L — ABNORMAL HIGH (ref <18)

## 2019-09-21 LAB — PHOSPHORUS: Phosphorus: 7.8 mg/dL — ABNORMAL HIGH (ref 2.5–4.6)

## 2019-09-21 LAB — TRIGLYCERIDES: Triglycerides: 88 mg/dL (ref <150)

## 2019-09-21 LAB — PROTIME-INR
INR: 1.3 — ABNORMAL HIGH (ref 0.8–1.2)
Prothrombin Time: 15.4 seconds — ABNORMAL HIGH (ref 11.4–15.2)

## 2019-09-21 LAB — BRAIN NATRIURETIC PEPTIDE: B Natriuretic Peptide: 247.5 pg/mL — ABNORMAL HIGH (ref 0.0–100.0)

## 2019-09-21 LAB — CK: Total CK: 289 U/L (ref 49–397)

## 2019-09-21 LAB — MRSA PCR SCREENING: MRSA by PCR: NEGATIVE

## 2019-09-21 LAB — LACTIC ACID, PLASMA: Lactic Acid, Venous: 1.6 mmol/L (ref 0.5–1.9)

## 2019-09-21 LAB — TSH: TSH: 0.256 u[IU]/mL — ABNORMAL LOW (ref 0.350–4.500)

## 2019-09-21 LAB — FIBRIN DERIVATIVES D-DIMER (ARMC ONLY): Fibrin derivatives D-dimer (ARMC): 712.01 ng/mL (FEU) — ABNORMAL HIGH (ref 0.00–499.00)

## 2019-09-21 MED ORDER — INSULIN ASPART 100 UNIT/ML IV SOLN
10.0000 [IU] | Freq: Once | INTRAVENOUS | Status: AC
  Administered 2019-09-21: 10 [IU] via INTRAVENOUS
  Filled 2019-09-21: qty 0.1

## 2019-09-21 MED ORDER — DEXTROSE 50 % IV SOLN
INTRAVENOUS | Status: AC
  Filled 2019-09-21: qty 50

## 2019-09-21 MED ORDER — LIDOCAINE-PRILOCAINE 2.5-2.5 % EX CREA
1.0000 "application " | TOPICAL_CREAM | CUTANEOUS | Status: DC | PRN
  Filled 2019-09-21: qty 5

## 2019-09-21 MED ORDER — DEXTROSE 50 % IV SOLN
INTRAVENOUS | Status: AC
  Administered 2019-09-21: 100 mL via INTRAVENOUS
  Filled 2019-09-21: qty 50

## 2019-09-21 MED ORDER — SODIUM CHLORIDE 0.9 % IV SOLN: 100.0000 mL | INTRAVENOUS | Status: DC | PRN

## 2019-09-21 MED ORDER — PENTAFLUOROPROP-TETRAFLUOROETH EX AERO
1.0000 "application " | INHALATION_SPRAY | CUTANEOUS | Status: DC | PRN
  Filled 2019-09-21: qty 30

## 2019-09-21 MED ORDER — FENTANYL 2500MCG IN NS 250ML (10MCG/ML) PREMIX INFUSION
0.0000 ug/h | INTRAVENOUS | Status: DC
  Administered 2019-09-22: 100 ug/h via INTRAVENOUS
  Administered 2019-09-23: 200 ug/h via INTRAVENOUS
  Filled 2019-09-21 (×2): qty 250

## 2019-09-21 MED ORDER — IPRATROPIUM-ALBUTEROL 0.5-2.5 (3) MG/3ML IN SOLN
3.0000 mL | Freq: Four times a day (QID) | RESPIRATORY_TRACT | Status: DC
  Administered 2019-09-21 – 2019-09-27 (×19): 3 mL via RESPIRATORY_TRACT
  Filled 2019-09-21 (×19): qty 3

## 2019-09-21 MED ORDER — INSULIN ASPART 100 UNIT/ML ~~LOC~~ SOLN
1.0000 [IU] | SUBCUTANEOUS | Status: DC
  Administered 2019-09-21: 2 [IU] via SUBCUTANEOUS
  Administered 2019-09-22: 3 [IU] via SUBCUTANEOUS
  Administered 2019-09-22: 2 [IU] via SUBCUTANEOUS
  Administered 2019-09-22: 1 [IU] via SUBCUTANEOUS
  Administered 2019-09-22 (×2): 2 [IU] via SUBCUTANEOUS
  Administered 2019-09-23: 3 [IU] via SUBCUTANEOUS
  Administered 2019-09-23: 2 [IU] via SUBCUTANEOUS
  Administered 2019-09-23 (×2): 1 [IU] via SUBCUTANEOUS
  Administered 2019-09-23: 3 [IU] via SUBCUTANEOUS
  Administered 2019-09-24: 2 [IU] via SUBCUTANEOUS
  Administered 2019-09-24: 3 [IU] via SUBCUTANEOUS
  Administered 2019-09-24: 1 [IU] via SUBCUTANEOUS
  Administered 2019-09-24 (×3): 2 [IU] via SUBCUTANEOUS
  Filled 2019-09-21 (×16): qty 1

## 2019-09-21 MED ORDER — HEPARIN SODIUM (PORCINE) 1000 UNIT/ML DIALYSIS
1000.0000 [IU] | INTRAMUSCULAR | Status: DC | PRN
  Filled 2019-09-21: qty 1

## 2019-09-21 MED ORDER — NOREPINEPHRINE 4 MG/250ML-% IV SOLN
INTRAVENOUS | Status: AC
  Administered 2019-09-21: 5 ug/min via INTRAVENOUS
  Filled 2019-09-21: qty 250

## 2019-09-21 MED ORDER — SODIUM CHLORIDE 0.9 % IV SOLN
2.0000 g | INTRAVENOUS | Status: DC
  Administered 2019-09-21 – 2019-09-23 (×3): 2 g via INTRAVENOUS
  Filled 2019-09-21: qty 20
  Filled 2019-09-21 (×2): qty 2
  Filled 2019-09-21: qty 20

## 2019-09-21 MED ORDER — CHLORHEXIDINE GLUCONATE 0.12% ORAL RINSE (MEDLINE KIT)
15.0000 mL | Freq: Two times a day (BID) | OROMUCOSAL | Status: DC
  Administered 2019-09-21 – 2019-09-27 (×9): 15 mL via OROMUCOSAL

## 2019-09-21 MED ORDER — PROPOFOL 1000 MG/100ML IV EMUL
5.0000 ug/kg/min | INTRAVENOUS | Status: DC
  Administered 2019-09-21 – 2019-09-22 (×4): 30 ug/kg/min via INTRAVENOUS
  Administered 2019-09-22: 20 ug/kg/min via INTRAVENOUS
  Administered 2019-09-23 (×3): 60 ug/kg/min via INTRAVENOUS
  Filled 2019-09-21 (×9): qty 100

## 2019-09-21 MED ORDER — SODIUM CHLORIDE 0.9 % IV SOLN
250.0000 mL | INTRAVENOUS | Status: DC
  Administered 2019-09-21: 250 mL via INTRAVENOUS

## 2019-09-21 MED ORDER — CHLORHEXIDINE GLUCONATE CLOTH 2 % EX PADS
6.0000 | MEDICATED_PAD | Freq: Every day | CUTANEOUS | Status: DC
  Administered 2019-09-21 – 2019-09-24 (×3): 6 via TOPICAL
  Filled 2019-09-21: qty 6

## 2019-09-21 MED ORDER — ORAL CARE MOUTH RINSE
15.0000 mL | OROMUCOSAL | Status: DC
  Administered 2019-09-22 – 2019-09-23 (×14): 15 mL via OROMUCOSAL

## 2019-09-21 MED ORDER — PANTOPRAZOLE SODIUM 40 MG IV SOLR
40.0000 mg | Freq: Every day | INTRAVENOUS | Status: DC
  Administered 2019-09-21 – 2019-09-23 (×3): 40 mg via INTRAVENOUS
  Filled 2019-09-21 (×3): qty 40

## 2019-09-21 MED ORDER — POLYETHYLENE GLYCOL 3350 17 G PO PACK: 17.0000 g | PACK | Freq: Every day | ORAL | Status: DC | PRN

## 2019-09-21 MED ORDER — SODIUM CHLORIDE 0.9 % IV SOLN: INTRAVENOUS | Status: DC

## 2019-09-21 MED ORDER — NOREPINEPHRINE 4 MG/250ML-% IV SOLN
2.0000 ug/min | INTRAVENOUS | Status: DC
  Administered 2019-09-21: 10 ug/min via INTRAVENOUS
  Filled 2019-09-21: qty 250

## 2019-09-21 MED ORDER — NOREPINEPHRINE 16 MG/250ML-% IV SOLN
0.0000 ug/min | INTRAVENOUS | Status: DC
  Administered 2019-09-22: 15 ug/min via INTRAVENOUS
  Administered 2019-09-22: 28 ug/min via INTRAVENOUS
  Filled 2019-09-21 (×4): qty 250

## 2019-09-21 MED ORDER — SODIUM CHLORIDE 0.9 % IV BOLUS (SEPSIS)
1000.0000 mL | Freq: Once | INTRAVENOUS | Status: AC
  Administered 2019-09-21: 1000 mL via INTRAVENOUS

## 2019-09-21 MED ORDER — DEXTROSE 50 % IV SOLN: 2.0000 | Freq: Once | INTRAVENOUS | Status: AC

## 2019-09-21 MED ORDER — ALTEPLASE 2 MG IJ SOLR
2.0000 mg | Freq: Once | INTRAMUSCULAR | Status: DC | PRN
  Filled 2019-09-21: qty 2

## 2019-09-21 MED ORDER — DOCUSATE SODIUM 100 MG PO CAPS: 100.0000 mg | ORAL_CAPSULE | Freq: Two times a day (BID) | ORAL | Status: DC | PRN

## 2019-09-21 MED ORDER — ONDANSETRON HCL 4 MG/2ML IJ SOLN
4.0000 mg | Freq: Four times a day (QID) | INTRAMUSCULAR | Status: DC | PRN
  Administered 2019-09-23 – 2019-09-27 (×3): 4 mg via INTRAVENOUS
  Filled 2019-09-21 (×3): qty 2

## 2019-09-21 MED ORDER — MIDAZOLAM HCL 50 MG/10ML IJ SOLN
2.0000 mg/h | INTRAVENOUS | Status: DC
  Filled 2019-09-21: qty 10

## 2019-09-21 MED ORDER — MIDAZOLAM HCL 2 MG/2ML IJ SOLN: 2.0000 mg | INTRAMUSCULAR | Status: DC | PRN

## 2019-09-21 MED ORDER — FENTANYL 2500MCG IN NS 250ML (10MCG/ML) PREMIX INFUSION
INTRAVENOUS | Status: AC
  Filled 2019-09-21: qty 250

## 2019-09-21 MED ORDER — SODIUM CHLORIDE 0.9 % IV SOLN
500.0000 mg | INTRAVENOUS | Status: DC
  Administered 2019-09-21 – 2019-09-23 (×3): 500 mg via INTRAVENOUS
  Filled 2019-09-21 (×4): qty 500

## 2019-09-21 MED ORDER — CALCIUM GLUCONATE 10 % IV SOLN
1.0000 g | Freq: Once | INTRAVENOUS | Status: AC
  Administered 2019-09-21: 1 g via INTRAVENOUS
  Filled 2019-09-21: qty 10

## 2019-09-21 MED ORDER — LIDOCAINE HCL (PF) 1 % IJ SOLN
5.0000 mL | INTRAMUSCULAR | Status: DC | PRN
  Filled 2019-09-21: qty 5

## 2019-09-21 MED ORDER — SODIUM BICARBONATE 8.4 % IV SOLN
50.0000 meq | Freq: Once | INTRAVENOUS | Status: AC
  Administered 2019-09-21: 50 meq via INTRAVENOUS
  Filled 2019-09-21: qty 50

## 2019-09-21 MED ORDER — HEPARIN SODIUM (PORCINE) 5000 UNIT/ML IJ SOLN
5000.0000 [IU] | Freq: Three times a day (TID) | INTRAMUSCULAR | Status: DC
  Administered 2019-09-21 – 2019-09-24 (×8): 5000 [IU] via SUBCUTANEOUS
  Filled 2019-09-21 (×8): qty 1

## 2019-09-21 NOTE — ED Notes (Signed)
RT notified of ABG.

## 2019-09-21 NOTE — ED Notes (Signed)
Pt intubated, color change noted, chest rise and fall noted, lung sound noted bilaterally. ETT size 8, 23 at the lip.

## 2019-09-21 NOTE — Progress Notes (Signed)
Called to get report. Spoke with Lexi, RN and she will call back to give report.

## 2019-09-21 NOTE — Progress Notes (Signed)
Consult received from Dr. Jayme Cloud.  Patient with severe acute kidney injury along with hyperkalemia.  Temporary dialysis catheter to be placed and we will follow with hemodialysis x2 hours using 1K bath.  Full consult to follow.

## 2019-09-21 NOTE — ED Notes (Signed)
100 of rocuronium and 30 of etomidate given IV at this time.

## 2019-09-21 NOTE — ED Provider Notes (Signed)
[facility] Emergency Department Provider Note  ____________________________________________  Time seen: Approximately 4:48 PM  I have reviewed the triage vital signs and the nursing notes.   HISTORY  Chief Complaint Altered Mental Status    Level 5 Caveat: Portions of the History and Physical including HPI and review of systems are unable to be completely obtained due to acute critical illness and unresponsiveness  HPI Gregory Hall is a 64 y.o. male with a history of CAD COPD CHF diabetes who is brought to the ED due to being found unresponsive by caregiver at group home.  He chronically wears 2 L nasal cannula, EMS report their initial oxygen saturation for the patient on arrival to the scene was 50%.  Initial blood pressure about 60/30.  They placed an intraosseous catheter, started IV fluids.   Placed on nonrebreather.     Past Medical History:  Diagnosis Date  . AKI (acute kidney injury) (HCC) 09/12/2019  . CAD (coronary artery disease)    s/p PTCA and stent x2  . Chest pain   . Chronic pain syndrome   . COPD (chronic obstructive pulmonary disease) (HCC)   . Degenerative cervical disc   . Depression   . Diabetes mellitus without complication (HCC)   . Dyslipidemia   . GERD (gastroesophageal reflux disease)   . Hernia 2014  . Hypertension   . MRSA (methicillin resistant staph aureus) culture positive 2011  . Neuropathy   . Nutcracker esophagus   . Rectus diastasis 07/19/2012     Patient Active Problem List   Diagnosis Date Noted  . Acute respiratory failure with hypoxia (HCC) 09/13/2019  . Acute CHF (congestive heart failure) (HCC) 09/12/2019  . NSTEMI (non-ST elevated myocardial infarction) (HCC) 09/12/2019  . AKI (acute kidney injury) (HCC) 09/12/2019  . Diabetes mellitus type 2, uncontrolled, with complications (HCC) 06/10/2019  . Chronic pain syndrome 06/10/2019  . COPD exacerbation (HCC) 06/10/2019  . COPD with acute  exacerbation (HCC) 06/09/2019  . CAD (coronary artery disease) 06/09/2019  . HTN (hypertension) 06/09/2019  . Acute on chronic respiratory failure with hypoxia and hypercapnia (HCC) 06/09/2019  . Anxiety 06/09/2019  . Chronic prescription opiate use 06/09/2019  . Hyperglycemia 06/09/2019  . Subclavian artery stenosis, left (HCC) 08/20/2014  . Rectus diastasis 07/19/2012  . Hernia   . Edema 05/10/2011  . Preoperative cardiovascular examination 10/06/2010  . SMOKER 11/25/2009  . CAROTID BRUIT, RIGHT 11/24/2009  . Chest pain 11/24/2009  . Hyperlipidemia 03/30/2009  . Coronary atherosclerosis 03/30/2009  . HYPERTENSION, HX OF 03/30/2009     Past Surgical History:  Procedure Laterality Date  . BACK SURGERY  2012  . CHOLECYSTECTOMY    . COLONOSCOPY  Jan 2014   [name]  . CORONARY ANGIOPLASTY WITH STENT PLACEMENT  2009   stents x2, in [place], [place]  . FOOT SURGERY     Right  . NECK SURGERY    . SPINE SURGERY  2012,2013  . TONSILLECTOMY       Prior to Admission medications   Medication Sig Start Date End Date Taking? Authorizing Provider  acetylcysteine (MUCOMYST) 20 % nebulizer solution Take 4 mLs by nebulization every 7 (seven) days. 09/16/19  Yes [provider]  aspirin EC 81 MG tablet Take 81 mg by mouth daily.   Yes [provider]  atorvastatin (LIPITOR) 20 MG tablet Take 60 mg by mouth at bedtime.    Yes [provider]  baclofen (LIORESAL) 20 MG tablet Take 20 mg by mouth every  8 (eight) hours. 09/02/19  Yes [provider]  Cholecalciferol (VITAMIN D3) 50 MCG (2000 UT) TABS Take 2,000 Units by mouth daily.    Yes [provider]  cyclobenzaprine (FLEXERIL) 5 MG tablet Take 1 tablet (5 mg total) by mouth 3 (three) times daily as needed. Patient taking differently: Take 5 mg by mouth 2 (two) times daily as needed for muscle spasms.  05/25/19  Yes Menshew, Charlesetta Ivory, PA-C  DULoxetine (CYMBALTA) 60 MG capsule Take 60 mg by mouth  daily.   Yes [provider]  Ensure Max Protein (ENSURE MAX PROTEIN) LIQD Take 330 mLs (11 oz total) by mouth 2 (two) times daily between meals. 06/14/19  Yes Sheikh, Omair Latif, DO  Fluticasone-Umeclidin-Vilant (TRELEGY ELLIPTA) 100-62.5-25 MCG/INH AEPB Inhale 1 puff into the lungs daily.   Yes [provider]  furosemide (LASIX) 20 MG tablet Take 1 tablet (20 mg total) by mouth daily. Patient taking differently: Take 20 mg by mouth daily as needed for fluid.  09/14/19  Yes Danford, Earl Lites, MD  guaiFENesin (MUCINEX) 600 MG 12 hr tablet Take 600 mg by mouth 2 (two) times daily.   Yes [provider]  insulin glargine (LANTUS) 100 UNIT/ML injection Inject 22 Units into the skin at bedtime.    Yes [provider]  insulin lispro (HUMALOG) 100 UNIT/ML injection Inject 4 Units into the skin 3 (three) times daily before meals. Hold for FSBG <100   Yes [provider]  ipratropium-albuterol (DUONEB) 0.5-2.5 (3) MG/3ML SOLN Take 3 mLs by nebulization every 6 (six) hours as needed. Patient taking differently: Take 3 mLs by nebulization every 6 (six) hours as needed (wheezing and shortness of breath).  06/14/19  Yes Sheikh, Omair Latif, DO  lactulose (CHRONULAC) 10 GM/15ML solution Take 20 g by mouth daily as needed for mild constipation.   Yes [provider]  lamoTRIgine (LAMICTAL) 100 MG tablet Take 100 mg by mouth daily.   Yes [provider]  Menthol-Methyl Salicylate (SALONPAS PAIN RELIEF PATCH EX) Place 1 patch onto the skin every 12 (twelve) hours as needed (aches and pains).   Yes [provider]  metFORMIN (GLUCOPHAGE-XR) 750 MG 24 hr tablet Take 1 tablet (750 mg total) by mouth 2 (two) times daily. 09/16/19  Yes Danford, Earl Lites, MD  metoprolol tartrate (LOPRESSOR) 25 MG tablet Take 12.5 mg by mouth 2 (two) times daily.   Yes [provider]  NAC 600 MG CAPS Take 600 mg by mouth daily. 09/08/19  Yes [provider]  NITROSTAT 0.4 MG SL tablet DISSOLVE (1) TABLET UNDER TONGUE AS NEEDED TO RELIEVE CHEST PAIN. MAYREPEAT EVERY 5 MINUTES. Patient taking differently: Place 0.4 mg under the tongue every 5 (five) minutes as needed for chest pain.  10/06/15  Yes Iran Ouch, MD  ondansetron (ZOFRAN) 4 MG tablet Take 4 mg by mouth every 8 (eight) hours as needed for nausea or vomiting.    Yes [provider]  Oxycodone HCl 10 MG TABS Take 1 tablet (10 mg total) by mouth 3 (three) times daily. 09/14/19  Yes Danford, Earl Lites, MD  oxymetazoline (AFRIN) 0.05 % nasal spray Place 2 sprays into both nostrils 2 (two) times daily as needed for congestion.   Yes [provider]  pantoprazole (PROTONIX) 40 MG tablet Take 40 mg by mouth daily.    Yes [provider]  pregabalin (LYRICA) 200 MG capsule Take 1 capsule (200 mg total) by mouth 3 (three) times  daily. 09/14/19  Yes Danford, Earl Lites, MD  rivaroxaban (XARELTO) 20 MG TABS tablet Take 20 mg by mouth daily with supper.   Yes [provider]  senna-docusate (SENOKOT-S) 8.6-50 MG tablet Take 1 tablet by mouth daily.    Yes [provider]  traZODone (DESYREL) 100 MG tablet Take 100 mg by mouth at bedtime. 09/08/19  Yes [provider]  lisinopril (ZESTRIL) 20 MG tablet Take 1 tablet (20 mg total) by mouth daily. 09/16/19   Danford, Earl Lites, MD     Allergies Acetaminophen and Nsaids   Family History  Family history unknown: Yes    Social History Social History   Tobacco Use  . Smoking status: Current Every Day Smoker    Packs/day: 0.50    Years: 30.00    Pack years: 15.00  . Smokeless tobacco: Never Used  Substance Use Topics  . Alcohol use: No  . Drug use: No    Review of Systems Level 5 Caveat: Portions of the History and Physical including HPI and review of systems are unable to be completely obtained due to patient being a poor historian   Constitutional:   No known  fever.  ENT:   No rhinorrhea. Cardiovascular:   No chest pain or syncope. Respiratory:   No dyspnea or cough. Gastrointestinal:   Negative for abdominal pain, vomiting and diarrhea.  Musculoskeletal:   Negative for focal pain or swelling ____________________________________________   PHYSICAL EXAM:  VITAL SIGNS: ED Triage Vitals  Enc Vitals Group     BP 09/21/19 1529 (!) 72/53     Pulse Rate 09/21/19 1529 83     Resp 09/21/19 1529 20     Temp 09/21/19 1529 98.1 F (36.7 C)     Temp Source 09/21/19 1529 Oral     SpO2 09/21/19 1529 100 %     Weight 09/21/19 1530 218 lb 4.1 oz (99 kg)     Height 09/21/19 1530 6\' 2"  (1.88 m)     Head Circumference --      Peak Flow --      Pain Score 09/21/19 1530 Asleep     Pain Loc --      Pain Edu? --      Excl. in GC? --     Vital signs reviewed, nursing assessments reviewed.   Constitutional:   Obtunded, ill-appearing, respiratory distress Eyes:   Conjunctivae are normal. EOMI. PERRL.  Intact corneal reflex ENT      Head:   Normocephalic and atraumatic.      Nose:   No congestion/rhinnorhea.       Mouth/Throat:   Dry mucous membranes, no pharyngeal erythema. No peritonsillar mass.       Neck:   No meningismus. Full ROM. Hematological/Lymphatic/Immunilogical:   No cervical lymphadenopathy. Cardiovascular:   RRR. Symmetric bilateral radial and DP pulses.  No murmurs. Cap refill less than 2 seconds. Respiratory:   Tachypnea, respiratory rate of about 25, shallow respirations.  Diminished breath sounds but no inspiratory crackles or wheezes.  Gargling exhalation. Gastrointestinal:   Soft and nontender. Non distended. There is no CVA tenderness.  No rebound, rigidity, or guarding.  Abdominal aorta nonpalpable  Musculoskeletal:   Normal range of motion in all extremities. No joint effusions.  No lower extremity tenderness.  No edema. Neurologic:   Nonverbal, not interactive. GCS = E1V1M3 = 5 Skin:    Skin is warm, dry and intact. No rash  noted.  No petechiae, purpura, or bullae.  ____________________________________________  LABS (pertinent positives/negatives) (all labs ordered are listed, but only abnormal results are displayed) Labs Reviewed  COMPREHENSIVE METABOLIC PANEL - Abnormal; Notable for the following components:      Result Value   Sodium 129 (*)    Potassium 7.0 (*)    Chloride 82 (*)    CO2 34 (*)    Glucose, Bld 142 (*)    BUN 127 (*)    Creatinine, Ser 7.07 (*)    Calcium 8.0 (*)    Total Protein 6.1 (*)    Albumin 3.1 (*)    GFR calc non Af Amer 7 (*)    GFR calc Af Amer 9 (*)    All other components within normal limits  CBC WITH DIFFERENTIAL/PLATELET - Abnormal; Notable for the following components:   WBC 11.7 (*)    RBC 4.13 (*)    Hemoglobin 11.5 (*)    HCT 35.5 (*)    RDW 17.9 (*)    Neutro Abs 9.6 (*)    Lymphs Abs 0.6 (*)    Monocytes Absolute 1.4 (*)    All other components within normal limits  PROTIME-INR - Abnormal; Notable for the following components:   Prothrombin Time 15.4 (*)    INR 1.3 (*)    All other components within normal limits  URINALYSIS, COMPLETE (UACMP) WITH MICROSCOPIC - Abnormal; Notable for the following components:   Color, Urine YELLOW (*)    APPearance HAZY (*)    Bacteria, UA RARE (*)    All other components within normal limits  BRAIN NATRIURETIC PEPTIDE - Abnormal; Notable for the following components:   B Natriuretic Peptide 247.5 (*)    All other components within normal limits  BLOOD GAS, VENOUS - Abnormal; Notable for the following components:   pH, Ven 7.24 (*)    pCO2, Ven 77 (*)    Bicarbonate 33.0 (*)    Acid-Base Excess 3.7 (*)    All other components within normal limits  FIBRIN DERIVATIVES D-DIMER ([facility] ONLY) - Abnormal; Notable for the following components:   Fibrin derivatives D-dimer ([facility]) 712.01 (*)    All other components within normal limits  TROPONIN I (HIGH SENSITIVITY) - Abnormal; Notable for the following components:    Troponin I (High Sensitivity) 29 (*)    All other components within normal limits  SARS CORONAVIRUS 2 BY RT PCR (HOSPITAL ORDER, PERFORMED IN [facility])  CULTURE, BLOOD (ROUTINE X 2)  CULTURE, BLOOD (ROUTINE X 2)  URINE CULTURE  LACTIC ACID, PLASMA  APTT  CK  TROPONIN I (HIGH SENSITIVITY)   ____________________________________________   EKG  Interpreted by me Sinus rhythm rate of 90, left axis, normal intervals.  Normal QRS.  Slight ST depression in V2 and V3 with peak T waves.  No STEMI.  ____________________________________________    RADIOLOGY  DG Chest Port 1 View  Result Date: 09/21/2019 CLINICAL DATA:  64 year old with possible sepsis and acute mental status changes. Patient lethargic and unresponsive. Hypotension. Current smoker. Follow-up pneumonia. EXAM: PORTABLE CHEST 1 VIEW COMPARISON:  09/12/2019 and earlier, including CTA chest 06/12/2019. FINDINGS: Cardiac silhouette normal in size for AP portable technique, unchanged. Thoracic aorta atherosclerotic, unchanged. Prominent central pulmonary arteries, unchanged. Since the most recent prior examination 9 days ago, improved aeration in the lung bases with near complete resolution of the airspace opacities present at that time. Residual mild linear atelectasis at the RIGHT lung base. No new pulmonary parenchymal abnormalities. Emphysematous changes as noted on the prior CT. Dorsal cord  stimulating device overlies the mid thoracic spine, unchanged. IMPRESSION: Near complete resolution of the bibasilar pneumonia since the most recent prior examination 9 days ago. Residual mild linear atelectasis at the RIGHT lung base. Electronically Signed   By: Hulan Saas M.D.   On: 09/21/2019 16:05    ____________________________________________   PROCEDURES .Critical Care Performed by: Sharman Cheek, MD Authorized by: Sharman Cheek, MD   Critical care provider statement:    Critical care time (minutes):   40   Critical care time was exclusive of:  Separately billable procedures and treating other patients   Critical care was necessary to treat or prevent imminent or life-threatening deterioration of the following conditions:  Respiratory failure, sepsis, shock, dehydration and renal failure   Critical care was time spent personally by me on the following activities:  Development of treatment plan with patient or surrogate, discussions with consultants, evaluation of patient's response to treatment, examination of patient, obtaining history from patient or surrogate, ordering and performing treatments and interventions, ordering and review of laboratory studies, ordering and review of radiographic studies, pulse oximetry, re-evaluation of patient's condition and review of old charts Comments:        Procedure Name: Intubation Date/Time: 09/21/2019 6:00 PM Performed by: Sharman Cheek, MD Pre-anesthesia Checklist: Patient identified, Patient being monitored, Emergency Drugs available, Timeout performed and Suction available Oxygen Delivery Method: Non-rebreather mask Preoxygenation: Pre-oxygenation with 100% oxygen Induction Type: Rapid sequence Ventilation: Mask ventilation without difficulty Laryngoscope Size: Glidescope and 3 Tube type: Subglottic suction tube Tube size: 8.0 mm Number of attempts: 1 Placement Confirmation: ETT inserted through vocal cords under direct vision,  CO2 detector and Breath sounds checked- equal and bilateral Secured at: 24 cm Tube secured with: Tape Comments:       .1-3 Lead EKG Interpretation Performed by: Sharman Cheek, MD Authorized by: Sharman Cheek, MD     Interpretation: normal     ECG rate:  90   ECG rate assessment: normal     Rhythm: sinus rhythm     Ectopy: none     Conduction: normal      ____________________________________________  DIFFERENTIAL DIAGNOSIS   Pneumonia, COVID-19, septic shock, dehydration, electrolyte  abnormality, renal failure, CO2 narcosis, PE  CLINICAL IMPRESSION / ASSESSMENT AND PLAN / ED COURSE  Medications ordered in the ED: Medications  cefTRIAXone (ROCEPHIN) 2 g in sodium chloride 0.9 % 100 mL IVPB (0 g Intravenous Stopped 09/21/19 1554)  azithromycin (ZITHROMAX) 500 mg in sodium chloride 0.9 % 250 mL IVPB (0 mg Intravenous Stopped 09/21/19 1645)  0.9 %  sodium chloride infusion (0 mLs Intravenous Stopped 09/21/19 1624)  norepinephrine (LEVOPHED) 4mg  in premix infusion (8 mcg/min Intravenous Rate/Dose Change 09/21/19 1615)  midazolam (VERSED) 50 mg in dextrose 5 % 50 mL (1 mg/mL) infusion (has no administration in time range)  fentaNYL in NS (44mcg/ml) infusion-PREMIX ( Intravenous New Bag/Given 09/21/19 1738)  sodium chloride 0.9 % bolus 1,000 mL (0 mLs Intravenous Stopped 09/21/19 1612)    And  sodium chloride 0.9 % bolus 1,000 mL (0 mLs Intravenous Stopped 09/21/19 1640)    And  sodium chloride 0.9 % bolus 1,000 mL (0 mLs Intravenous Stopped 09/21/19 1717)  calcium gluconate inj 10% (1 g) URGENT USE ONLY! (1 g Intravenous Given 09/21/19 1617)  insulin aspart (novoLOG) injection 10 Units (10 Units Intravenous Given 09/21/19 1656)    And  dextrose 50 % solution 100 mL (100 mLs Intravenous Given 09/21/19 1625)  sodium bicarbonate injection 50 mEq (50  mEq Intravenous Given 09/21/19 1618)    Pertinent labs & imaging results that were available during my care of the patient were reviewed by me and considered in my medical decision making (see chart for details).   Gregory Hall was evaluated in Emergency Department on 09/21/2019 for the symptoms described in the history of present illness. He was evaluated in the context of the global COVID-19 pandemic, which necessitated consideration that the patient might be at risk for infection with the SARS-CoV-2 virus that causes COVID-19. Institutional protocols and algorithms that pertain to the evaluation of patients at  risk for COVID-19 are in a state of rapid change based on information released by regulatory bodies including the CDC and federal and state organizations. These policies and algorithms were followed during the patient's care in the ED.     Clinical Course as of Sep 20 1799  Wynelle Link Sep 21, 2019  1527 Pt arrives in extremis with obtundation, resp failure requiring NRB and diffusely rhonchorous breath sounds. Suspect septic shock from bilateral pneumonia.  Will start large volume IVF resus. Pt is full code, and will need intubation due to impending respiratory fatigue and resp. Failure, but I will continue volume expansion to minimize risk of peri-intubation arrest.   [PS]  1613 Labs show acute renal failure with creat 7, K 7, bun 127, possibly the cause of encephalopathy. Will continue vigorous IVF.  Cxr relatively clear. Awaiting vbg ? Co2 narcosis.   Comprehensive metabolic panel(!!) [PS]  1628 Blood pressure improved to about 100/60, mental status is improving as well, patient now awake and able to answer questions and notes that he has been feeling sick and his whole body hurting for several days, associated with loss of appetite and very poor oral intake.  Denies diarrhea.  Work of breathing is improved.  Now appears unlikely to be at risk for worsening respiratory failure, will defer intubation and continue resuscitation.  Calcium, bicarb, insulin given for hyper-k   [PS]  1646 Vbg c/w resp acidosis but unlikely to be causing co2 narcosis.   [PS]  1647 D-dimer mildly elevated.  Covid negative.  May be related to recent illness.  He is on Xarelto, so PE is unlikely, and will need to defer CT angiogram due to acute renal failure.  Fibrin derivatives D-dimer [facility])(!): 712.01 [PS]  1720 Pt has waxing/waning mental status, now deeply somnolent again. Likely developing worsening co2 narcosis on top of metabolic encephalopathy, will require mech ventilation.  BP(!): 89/45 [PS]    Clinical Course User  Index [PS] Sharman Cheek, MD     ----------------------------------------- 6:01 PM on 09/21/2019 -----------------------------------------  Intubation completed, will get follow-up chest x-ray, start fentanyl and Versed continuous infusions for sedation and pain management.  Case discussed with intensivist.   ____________________________________________   FINAL CLINICAL IMPRESSION(S) / ED DIAGNOSES    Final diagnoses:  Acute renal failure, unspecified acute renal failure type (HCC)  Acute on chronic respiratory failure with hypoxia (HCC)  Acute metabolic encephalopathy  Type 2 diabetes mellitus without complication, with long-term current use of insulin (HCC)  Chronic congestive heart failure, unspecified heart failure type [facility])     ED Discharge Orders    None      Portions of this note were generated with dragon dictation software. Dictation errors may occur despite best attempts at proofreading.   Sharman Cheek, MD 09/21/19 [phone]

## 2019-09-21 NOTE — ED Triage Notes (Signed)
Per ems pt found by care giver lethargic and unresponsive. Pt chronically wears 2  Liter's oxygen. BGL 180. BP 68/28. HR 90. IO placed in left humerus. Pt responsive to pain only.

## 2019-09-21 NOTE — ED Notes (Signed)
Attempted to call report, ICU busy. Extension gvn

## 2019-09-21 NOTE — ED Notes (Signed)
RT, EDP, and 3 RN's at bedside to intubate. Suction set up and IV patency confirmed. Ambu bag in room.

## 2019-09-21 NOTE — Progress Notes (Signed)
CODE SEPSIS - PHARMACY COMMUNICATION  **Broad Spectrum Antibiotics should be administered within 1 hour of Sepsis diagnosis**  Time Code Sepsis Called/Page Received: 1525  Antibiotics Ordered: Azithromycin, Rocephin  Time of 1st antibiotic administration: 1540  Additional action taken by pharmacy: none  If necessary, Name of Provider/Nurse Contacted: n/a    Bettey Costa ,PharmD Clinical Pharmacist  09/21/2019  3:52 PM

## 2019-09-21 NOTE — H&P (Signed)
Gregory Hall is an 64 y.o. male.   Chief Complaint: Patient presented with unresponsiveness.  Level 5 caveat: Significant portions of the history and physical and review of systems have not been able to be completely obtained due to the patient's unresponsiveness and now intubated and mechanically ventilated status.  HPI: The patient is a 64 year old male with a significant history as noted below with recent admission to [facility] on 3 September due to acute kidney injury who presented to [facility] ED today after being found unresponsive by caregiver at a group home.  The patient was brought via EMS.  The patient apparently has a history of COPD and chronically is on 2 L nasal cannula O2.  Initial oxygen saturation noted by EMS was 50% on arrival.  Initial blood pressure was 60/30.  EMS placed an intraosseous catheter and started IV fluids.  The patient was then placed on nonrebreather mask and brought to the emergency room.  In the emergency room he was evaluated and had to be subsequently intubated due to inability to protect airway, hypoxia and obtundation.  PCCM has been called to admit the patient.  The patient initially required norepinephrine infusion however this was subsequently weaned off.  Patient's laboratory data shows significant hyperkalemia and severe acute kidney injury.  The patient will need emergent hemodialysis.  Patient will be admitted to the intensive care unit and nephrology will be consulted.  Patient tested negative for COVID-19.  Past Medical History:  Diagnosis Date  . AKI (acute kidney injury) ([place]) 09/12/2019  . CAD (coronary artery disease)    s/p PTCA and stent x2  . Chest pain   . Chronic pain syndrome   . COPD (chronic obstructive pulmonary disease) ([place])   . Degenerative cervical disc   . Depression   . Diabetes mellitus without complication ([place])   . Dyslipidemia   . GERD (gastroesophageal reflux disease)   . Hernia 2014  . Hypertension   . MRSA  (methicillin resistant staph aureus) culture positive 2011  . Neuropathy   . Nutcracker esophagus   . Rectus diastasis 07/19/2012    Past Surgical History:  Procedure Laterality Date  . BACK SURGERY  2012  . CHOLECYSTECTOMY    . COLONOSCOPY  Jan 2014   [name]  . CORONARY ANGIOPLASTY WITH STENT PLACEMENT  2009   stents x2, in [place], [place]  . FOOT SURGERY     Right  . NECK SURGERY    . SPINE SURGERY  2012,2013  . TONSILLECTOMY      Family History  Family history unknown: Yes   Social History   Tobacco Use  . Smoking status: Current Every Day Smoker    Packs/day: 0.50    Years: 30.00    Pack years: 15.00  . Smokeless tobacco: Never Used  Substance Use Topics  . Alcohol use: No    Allergies:  Allergies  Allergen Reactions  . Acetaminophen Other (See Comments)    Reaction:  Unknown  Kidney failure Told not to take from home M.D. Related to kidney and renal failure   . Nsaids Other (See Comments)    Reaction:  Unknown  Kidney failure Patient states not to take from home M.D. Related to kidney and renal failure.  Other reaction(s): Unknown   Current Outpatient Medications  Medication Instructions  . acetylcysteine (MUCOMYST) 20 % nebulizer solution 4 mLs, Nebulization, Every 7 days  . aspirin EC 81 mg, Oral, Daily  . atorvastatin (LIPITOR) 60 mg, Oral, Daily at bedtime  .  baclofen (LIORESAL) 20 mg, Oral, Every 8 hours  . cyclobenzaprine (FLEXERIL) 5 mg, Oral, 3 times daily PRN  . DULoxetine (CYMBALTA) 60 mg, Oral, Daily  . Ensure Max Protein (ENSURE MAX PROTEIN) LIQD 11 oz, Oral, 2 times daily between meals  . Fluticasone-Umeclidin-Vilant (TRELEGY ELLIPTA) 100-62.5-25 MCG/INH AEPB 1 puff, Inhalation, Daily  . furosemide (LASIX) 20 mg, Oral, Daily  . guaiFENesin (MUCINEX) 600 mg, Oral, 2 times daily  . insulin glargine (LANTUS) 22 Units, Subcutaneous, Daily at bedtime  . insulin lispro (HUMALOG) 4 Units, Subcutaneous, 3 times daily before meals, Hold for FSBG  <100  . ipratropium-albuterol (DUONEB) 0.5-2.5 (3) MG/3ML SOLN 3 mLs, Nebulization, Every 6 hours PRN  . lactulose (CHRONULAC) 20 g, Oral, Daily PRN  . lamoTRIgine (LAMICTAL) 100 mg, Oral, Daily  . lisinopril (ZESTRIL) 20 mg, Oral, Daily  . Menthol-Methyl Salicylate (SALONPAS PAIN RELIEF PATCH EX) 1 patch, Transdermal, Every 12 hours PRN  . metFORMIN (GLUCOPHAGE-XR) 750 mg, Oral, 2 times daily  . metoprolol tartrate (LOPRESSOR) 12.5 mg, Oral, 2 times daily  . NAC 600 mg, Oral, Daily  . NITROSTAT 0.4 MG SL tablet DISSOLVE (1) TABLET UNDER TONGUE AS NEEDED TO RELIEVE CHEST PAIN. MAYREPEAT EVERY 5 MINUTES.  Marland Kitchen ondansetron (ZOFRAN) 4 mg, Oral, Every 8 hours PRN  . Oxycodone HCl 10 mg, Oral, 3 times daily  . oxymetazoline (AFRIN) 0.05 % nasal spray 2 sprays, Each Nare, 2 times daily PRN  . pantoprazole (PROTONIX) 40 mg, Oral, Daily  . pregabalin (LYRICA) 200 mg, Oral, 3 times daily  . rivaroxaban (XARELTO) 20 mg, Oral, Daily with supper  . senna-docusate (SENOKOT-S) 8.6-50 MG tablet 1 tablet, Oral, Daily  . traZODone (DESYREL) 100 mg, Oral, Daily at bedtime  . Vitamin D3 2,000 Units, Oral, Daily    Current inpatient medications: Scheduled Meds: . chlorhexidine gluconate (MEDLINE KIT)  15 mL Mouth Rinse BID  . [START ON 09/22/2019] Chlorhexidine Gluconate Cloth  6 each Topical Q0600  . heparin  5,000 Units Subcutaneous Q8H  . insulin aspart  1-3 Units Subcutaneous Q4H  . ipratropium-albuterol  3 mL Nebulization Q6H  . [START ON 09/22/2019] mouth rinse  15 mL Mouth Rinse 10 times per day  . pantoprazole (PROTONIX) IV  40 mg Intravenous QHS   Continuous Infusions: . sodium chloride Stopped (09/21/19 1624)  . sodium chloride 75 mL/hr at 09/21/19 1919  . sodium chloride    . sodium chloride    . azithromycin Stopped (09/21/19 1645)  . cefTRIAXone (ROCEPHIN)  IV Stopped (09/21/19 1554)  . fentaNYL infusion INTRAVENOUS 200 mcg/hr (09/21/19 2052)  . norepinephrine (LEVOPHED) Adult infusion 10  mcg/min (09/21/19 2132)  . propofol (DIPRIVAN) infusion 30 mcg/kg/min (09/21/19 2051)   PRN Meds:.sodium chloride, sodium chloride, alteplase, docusate sodium, heparin, lidocaine (PF), lidocaine-prilocaine, midazolam, ondansetron (ZOFRAN) IV, pentafluoroprop-tetrafluoroeth, polyethylene glycol   Results for orders placed or performed during the hospital encounter of 09/21/19 (from the past 48 hour(s))  Urinalysis, Complete w Microscopic Urine, Catheterized     Status: Abnormal   Collection Time: 09/21/19  3:25 PM  Result Value Ref Range   Color, Urine YELLOW (A) YELLOW   APPearance HAZY (A) CLEAR   Specific Gravity, Urine 1.016 1.005 - 1.030   pH 5.0 5.0 - 8.0   Glucose, UA NEGATIVE NEGATIVE mg/dL   Hgb urine dipstick NEGATIVE NEGATIVE   Bilirubin Urine NEGATIVE NEGATIVE   Ketones, ur NEGATIVE NEGATIVE mg/dL   Protein, ur NEGATIVE NEGATIVE mg/dL   Nitrite NEGATIVE NEGATIVE   Leukocytes,Ua NEGATIVE NEGATIVE  RBC / HPF 0-5 0 - 5 RBC/hpf   WBC, UA 0-5 0 - 5 WBC/hpf   Bacteria, UA RARE (A) NONE SEEN   Squamous Epithelial / LPF 0-5 0 - 5   Mucus PRESENT     Comment: Performed at [facility], [address] [place], [place] [address]  Lactic acid, plasma     Status: None   Collection Time: 09/21/19  3:26 PM  Result Value Ref Range   Lactic Acid, Venous 1.6 0.5 - 1.9 mmol/L    Comment: Performed at [facility], [address] [place], [place] [address]  Comprehensive metabolic panel     Status: Abnormal   Collection Time: 09/21/19  3:26 PM  Result Value Ref Range   Sodium 129 (L) 135 - 145 mmol/L   Potassium 7.0 (HH) 3.5 - 5.1 mmol/L    Comment: CRITICAL RESULT CALLED TO, READ BACK BY AND VERIFIED WITH LEXIE OLIVER RN AT 1608 ON 09/21/19 SNG    Chloride 82 (L) 98 - 111 mmol/L   CO2 34 (H) 22 - 32 mmol/L   Glucose, Bld 142 (H) 70 - 99 mg/dL    Comment: Glucose reference range applies only to samples taken after fasting for at least 8 hours.   BUN 127 (H) 8  - 23 mg/dL    Comment: RESULT CONFIRMED BY MANUAL DILUTION SNG   Creatinine, Ser 7.07 (H) 0.61 - 1.24 mg/dL   Calcium 8.0 (L) 8.9 - 10.3 mg/dL   Total Protein 6.1 (L) 6.5 - 8.1 g/dL   Albumin 3.1 (L) 3.5 - 5.0 g/dL   AST 24 15 - 41 U/L   ALT 18 0 - 44 U/L   Alkaline Phosphatase 79 38 - 126 U/L   Total Bilirubin 0.9 0.3 - 1.2 mg/dL   GFR calc non Af Amer 7 (L) >60 mL/min   GFR calc Af Amer 9 (L) >60 mL/min   Anion gap 13 5 - 15    Comment: Performed at [facility], [address] [place], [place] [address]  CBC WITH DIFFERENTIAL     Status: Abnormal   Collection Time: 09/21/19  3:26 PM  Result Value Ref Range   WBC 11.7 (H) 4.0 - 10.5 K/uL   RBC 4.13 (L) 4.22 - 5.81 MIL/uL   Hemoglobin 11.5 (L) 13.0 - 17.0 g/dL   HCT 35.5 (L) 39 - 52 %   MCV 86.0 80.0 - 100.0 fL   MCH 27.8 26.0 - 34.0 pg   MCHC 32.4 30.0 - 36.0 g/dL   RDW 17.9 (H) 11.5 - 15.5 %   Platelets 328 150 - 400 K/uL   nRBC 0.2 0.0 - 0.2 %   Neutrophils Relative % 83 %   Neutro Abs 9.6 (H) 1.7 - 7.7 K/uL   Lymphocytes Relative 5 %   Lymphs Abs 0.6 (L) 0.7 - 4.0 K/uL   Monocytes Relative 12 %   Monocytes Absolute 1.4 (H) 0 - 1 K/uL   Eosinophils Relative 0 %   Eosinophils Absolute 0.0 0 - 0 K/uL   Basophils Relative 0 %   Basophils Absolute 0.0 0 - 0 K/uL   Immature Granulocytes 0 %   Abs Immature Granulocytes 0.05 0.00 - 0.07 K/uL    Comment: Performed at [facility], [address] [place], [place] [address]  Protime-INR     Status: Abnormal   Collection Time: 09/21/19  3:26 PM  Result Value Ref Range   Prothrombin Time 15.4 (H) 11.4 -  15.2 seconds   INR 1.3 (H) 0.8 - 1.2    Comment: (NOTE) INR goal varies based on device and disease states. Performed at [facility], [address] [place], [place] [address]   APTT     Status: None   Collection Time: 09/21/19  3:26 PM  Result Value Ref Range   aPTT 33 24 - 36 seconds    Comment: Performed at [facility], [address] [place], [place] [address]  Fibrin derivatives D-Dimer     Status: Abnormal   Collection Time: 09/21/19  3:26 PM  Result Value Ref Range   Fibrin derivatives D-dimer ([facility]) 712.01 (H) 0.00 - 499.00 ng/mL (FEU)    Comment: (NOTE) <> Exclusion of Venous Thromboembolism (VTE) - OUTPATIENT ONLY   (Emergency Department or [place])    0-499 ng/ml (FEU): With a low to intermediate pretest probability                      for VTE this test result excludes the diagnosis                      of VTE.   >499 ng/ml (FEU) : VTE not excluded; additional work up for VTE is                      required.  <> Testing on Inpatients and Evaluation of Disseminated Intravascular   Coagulation (DIC) Reference Range:   0-499 ng/ml (FEU) Performed at [facility], [address] [place], [place] [address]   Troponin I (High Sensitivity)     Status: Abnormal   Collection Time: 09/21/19  3:26 PM  Result Value Ref Range   Troponin I (High Sensitivity) 29 (H) <18 ng/L    Comment: (NOTE) Elevated high sensitivity troponin I (hsTnI) values and significant  changes across serial measurements may suggest ACS but many other  chronic and acute conditions are known to elevate hsTnI results.  Refer to the "Links" section for chest pain algorithms and additional  guidance. Performed at [facility], [address] [place], [place] [address]   CK     Status: None   Collection Time: 09/21/19  3:26 PM  Result Value Ref Range   Total CK 289 49.0 - 397.0 U/L    Comment: Performed at [facility], [address] [place], [place] [address]  Brain natriuretic peptide     Status: Abnormal   Collection Time: 09/21/19  3:27 PM  Result Value Ref Range   B Natriuretic Peptide 247.5 (H) 0.0 - 100.0 pg/mL    Comment: Performed at [facility], [address] [place], [place] [address]  Blood gas, venous     Status: Abnormal   Collection Time: 09/21/19  3:27 PM  Result  Value Ref Range   pH, Ven 7.24 (L) 7.25 - 7.43   pCO2, Ven 77 (HH) 44 - 60 mmHg    Comment: CRITICAL RESULT CALLED TO, READ BACK BY AND VERIFIED WITH: CRITICAL CALUE 09/21/19,1645,DR. STAFFORD/FD    pO2, Ven 45.0 32 - 45 mmHg   Bicarbonate 33.0 (H) 20.0 - 28.0 mmol/L   Acid-Base Excess 3.7 (H) 0.0 - 2.0 mmol/L   O2 Saturation 71.8 %   Patient temperature 37.0    Collection site VEIN    Sample type VEIN     Comment: Performed at [facility], [address] [place],  [address]  SARS  Coronavirus 2 by RT PCR (hospital order, performed in [facility] hospital lab) Nasopharyngeal Nasopharyngeal Swab     Status: None   Collection Time: 09/21/19  3:28 PM   Specimen: Nasopharyngeal Swab  Result Value Ref Range   SARS Coronavirus 2 NEGATIVE NEGATIVE    Comment: (NOTE) SARS-CoV-2 target nucleic acids are NOT DETECTED.  The SARS-CoV-2 RNA is generally detectable in upper and lower respiratory specimens during the acute phase of infection. The lowest concentration of SARS-CoV-2 viral copies this assay can detect is 250 copies / mL. A negative result does not preclude SARS-CoV-2 infection and should not be used as the sole basis for treatment or other patient management decisions.  A negative result may occur with improper specimen collection / handling, submission of specimen other than nasopharyngeal swab, presence of viral mutation(s) within the areas targeted by this assay, and inadequate number of viral copies (<250 copies / mL). A negative result must be combined with clinical observations, patient history, and epidemiological information.  Fact Sheet for Patients:   StrictlyIdeas.no  Fact Sheet for Healthcare Providers: BankingDealers.co.za  This test is not yet approved or  cleared by the [place] FDA and has been authorized for detection and/or diagnosis of SARS-CoV-2 by FDA under an Emergency Use Authorization  (EUA).  This EUA will remain in effect (meaning this test can be used) for the duration of the COVID-19 declaration under Section 564(b)(1) of the Act, 21 U.S.C. section 360bbb-3(b)(1), unless the authorization is terminated or revoked sooner.  Performed at [facility], [address] [place], [place] [address]   Glucose, capillary     Status: Abnormal   Collection Time: 09/21/19  6:59 PM  Result Value Ref Range   Glucose-Capillary 147 (H) 70 - 99 mg/dL    Comment: Glucose reference range applies only to samples taken after fasting for at least 8 hours.   DG Chest Portable 1 View  Result Date: 09/21/2019 CLINICAL DATA:  Post intubation. EXAM: PORTABLE CHEST 1 VIEW COMPARISON:  09/21/2019 FINDINGS: There is a worsening airspace opacity at the right lung base. The endotracheal tube terminates above the carina by approximately 4.5 cm. The enteric tube appears to terminate at the level of the diaphragm. Coarse airspace opacities are noted at the lung bases. There is no pneumothorax or significant pleural effusion. A left subclavian artery stent is noted the lungs are hyperexpanded. Emphysematous changes are again noted. IMPRESSION: 1. Lines and tubes as above. The enteric tube is not appear to extend into the stomach. Repositioning is recommended. 2. New airspace opacity at the right lung base concerning for worsening atelectasis or a developing infiltrate. There is persistent atelectasis at the left lung base. These results will be called to the ordering clinician or representative by the Radiologist Assistant, and communication documented in the PACS or [employer]. Electronically Signed   By: Constance Holster M.D.   On: 09/21/2019 18:30   DG Chest Port 1 View  Result Date: 09/21/2019 CLINICAL DATA:  64 year old with possible sepsis and acute mental status changes. Patient lethargic and unresponsive. Hypotension. Current smoker. Follow-up pneumonia. EXAM: PORTABLE CHEST 1 VIEW  COMPARISON:  09/12/2019 and earlier, including CTA chest 06/12/2019. FINDINGS: Cardiac silhouette normal in size for AP portable technique, unchanged. Thoracic aorta atherosclerotic, unchanged. Prominent central pulmonary arteries, unchanged. Since the most recent prior examination 9 days ago, improved aeration in the lung bases with near complete resolution of the airspace opacities present at that time. Residual mild linear atelectasis at the  RIGHT lung base. No new pulmonary parenchymal abnormalities. Emphysematous changes as noted on the prior CT. Dorsal cord stimulating device overlies the mid thoracic spine, unchanged. IMPRESSION: Near complete resolution of the bibasilar pneumonia since the most recent prior examination 9 days ago. Residual mild linear atelectasis at the RIGHT lung base. Electronically Signed   By: Evangeline Dakin M.D.   On: 09/21/2019 16:05    Review of Systems  Unable to perform ROS: Intubated   Vent Mode: AC FiO2 (%):  [50 %] 50 % Set Rate:  [18 bmp] 18 bmp Vt Set:  [500 mL] 500 mL PEEP:  [5 cmH20] 5 cmH20   Blood pressure (!) 151/72, pulse 73, temperature 98.1 F (36.7 C), temperature source Oral, resp. rate (!) 24, height 6' 2"  (1.88 m), weight 99 kg, SpO2 91 %. Physical Exam  GENERAL: Disheveled appearing gentleman, obtunded, intubated mechanically ventilated. HEAD: Normocephalic, atraumatic.  EYES: Pupils equal, round, reactive to light.  No scleral icterus.  MOUTH: Edentulous, orotracheally intubated.  OG in place. NECK: Supple. No thyromegaly. Trachea midline. No JVD.  No adenopathy. PULMONARY: Good air entry bilaterally.  Scattered rhonchi throughout, no wheezes.   CARDIOVASCULAR: S1 and S2. Regular rate and rhythm.  No rubs, murmurs or gallops heard. ABDOMEN: Nondistended abdomen, normoactive bowel sounds, no hepatosplenomegaly.  No ascites.  Cannot elicit tenderness. MUSCULOSKELETAL: No joint deformity, no clubbing, 1-2+ lower extremity edema to mid shin.   Chronic stasis changes  NEUROLOGIC: Obtunded. SKIN: Intact,warm,dry. PSYCH: Cannot assess due to mechanically ventilated status.  Assessment/Plan  Acute on chronic respiratory failure with hypoxia Acute metabolic encephalopathy due to severe acute kidney injury Hx: COPD/chronic respiratory failure with hypoxia Continue ventilator support Bronchodilators Wean FiO2 and PEEP as tolerated SBT and SAT as tolerated Propofol/fentanyl for ventilator comfort  Severe acute kidney injury Severe hyperkalemia: K = 7.0 Metabolic acidosis Metabolic encephalopathy Patient is on ACE inhibitor as outpatient may have aggravated AKI Patient will need temporary dialysis catheter Emergent dialysis to correct severe hyperkalemia Avoid nephrotoxins Nephrology has been consulted Supportive care  COPD without overt exacerbation Cannot exclude early infiltrate Culture sputum Patient received ceftriaxone and Azithromycin in ED Trend procalcitonin Pulmonary hygiene Bronchodilators  Diabetes mellitus ICU hyperglycemia protocol  Prophylaxis GI: PPI DVT: Heparin subcu  Total critical care time: 75 minutes.   Renold Don, MD [facility] 09/21/2019, 7:09 PM  *This note was dictated using voice recognition software/Dragon.  Despite best efforts to proofread, errors can occur which can change the meaning.  Any change was purely unintentional.

## 2019-09-21 NOTE — Progress Notes (Signed)
PHARMACY CONSULT NOTE - FOLLOW UP  Pharmacy Consult for Electrolyte Monitoring and Replacement   Recent Labs: Potassium (mmol/L)  Date Value  09/21/2019 7.0 (HH)  03/31/2013 3.6   Magnesium (mg/dL)  Date Value  00/92/3300 1.6 (L)  06/25/2012 2.4   Calcium (mg/dL)  Date Value  76/22/6333 8.0 (L)   Calcium, Total (mg/dL)  Date Value  54/56/2563 7.6 (L)   Albumin (g/dL)  Date Value  89/37/3428 3.1 (L)  12/07/2014 3.6  03/30/2013 3.2 (L)   Phosphorus (mg/dL)  Date Value  76/81/1572 4.3  03/20/2012 3.4   Sodium (mmol/L)  Date Value  09/21/2019 129 (L)  03/31/2013 142     Assessment: Patient is a 64yo male admitted with severe AKI and hyperkalemia. Pharmacy consulted to assist with management of electrolytes. Patient is going for HD tonight.  Plan:  Will follow up on AM labs.  Clovia Cuff, PharmD, BCPS 09/21/2019 10:08 PM

## 2019-09-21 NOTE — ED Notes (Signed)
Attempted to call report

## 2019-09-21 NOTE — ED Notes (Signed)
4 MG versed given IV bolus at this time.

## 2019-09-21 NOTE — Progress Notes (Signed)
Sister, Camelia Eng, called wanting an update on patient status. She states that the patient lives in a residential health facility and that she is the next of kin. Confirmed that patient was in ICU, stable and that she could visit the patient in the morning. She expressed concern because she is in [facility] and is wanting to know if she needs to travel here now. Primary RN notified that family would like an update as soon as she is able.

## 2019-09-22 ENCOUNTER — Inpatient Hospital Stay: Payer: Medicare Other

## 2019-09-22 DIAGNOSIS — J9601 Acute respiratory failure with hypoxia: Secondary | ICD-10-CM

## 2019-09-22 DIAGNOSIS — Z515 Encounter for palliative care: Secondary | ICD-10-CM

## 2019-09-22 DIAGNOSIS — J441 Chronic obstructive pulmonary disease with (acute) exacerbation: Secondary | ICD-10-CM

## 2019-09-22 DIAGNOSIS — E875 Hyperkalemia: Secondary | ICD-10-CM

## 2019-09-22 DIAGNOSIS — G9341 Metabolic encephalopathy: Secondary | ICD-10-CM

## 2019-09-22 DIAGNOSIS — Z7189 Other specified counseling: Secondary | ICD-10-CM

## 2019-09-22 LAB — BLOOD GAS, ARTERIAL
Acid-Base Excess: 3.5 mmol/L — ABNORMAL HIGH (ref 0.0–2.0)
Acid-Base Excess: 5.6 mmol/L — ABNORMAL HIGH (ref 0.0–2.0)
Bicarbonate: 32.1 mmol/L — ABNORMAL HIGH (ref 20.0–28.0)
Bicarbonate: 31.9 mmol/L — ABNORMAL HIGH (ref 20.0–28.0)
FIO2: 50
FIO2: 0.4
MECHVT: 500 mL
MECHVT: 500 mL
Mechanical Rate: 18
O2 Saturation: 91.2 %
O2 Saturation: 93.9 %
PEEP: 5 cmH2O
PEEP: 5 cmH2O
Patient temperature: 37
Patient temperature: 37
RATE: 24 resp/min
pCO2 arterial: 70 mmHg (ref 32.0–48.0)
pCO2 arterial: 54 mmHg — ABNORMAL HIGH (ref 32.0–48.0)
pH, Arterial: 7.27 — ABNORMAL LOW (ref 7.350–7.450)
pH, Arterial: 7.38 (ref 7.350–7.450)
pO2, Arterial: 70 mmHg — ABNORMAL LOW (ref 83.0–108.0)
pO2, Arterial: 72 mmHg — ABNORMAL LOW (ref 83.0–108.0)

## 2019-09-22 LAB — CBC
HCT: 34.8 % — ABNORMAL LOW (ref 39.0–52.0)
Hemoglobin: 11.1 g/dL — ABNORMAL LOW (ref 13.0–17.0)
MCH: 27.8 pg (ref 26.0–34.0)
MCHC: 31.9 g/dL (ref 30.0–36.0)
MCV: 87.2 fL (ref 80.0–100.0)
Platelets: 353 10*3/uL (ref 150–400)
RBC: 3.99 MIL/uL — ABNORMAL LOW (ref 4.22–5.81)
RDW: 18.6 % — ABNORMAL HIGH (ref 11.5–15.5)
WBC: 10 10*3/uL (ref 4.0–10.5)
nRBC: 0.2 % (ref 0.0–0.2)

## 2019-09-22 LAB — BLOOD CULTURE ID PANEL (REFLEXED) - BCID2

## 2019-09-22 LAB — BASIC METABOLIC PANEL
Anion gap: 10 (ref 5–15)
BUN: 69 mg/dL — ABNORMAL HIGH (ref 8–23)
CO2: 31 mmol/L (ref 22–32)
Calcium: 7.5 mg/dL — ABNORMAL LOW (ref 8.9–10.3)
Chloride: 95 mmol/L — ABNORMAL LOW (ref 98–111)
Creatinine, Ser: 3.13 mg/dL — ABNORMAL HIGH (ref 0.61–1.24)
GFR calc Af Amer: 23 mL/min — ABNORMAL LOW (ref >60)
GFR calc non Af Amer: 20 mL/min — ABNORMAL LOW (ref >60)
Glucose, Bld: 135 mg/dL — ABNORMAL HIGH (ref 70–99)
Potassium: 4.3 mmol/L (ref 3.5–5.1)
Sodium: 136 mmol/L (ref 135–145)

## 2019-09-22 LAB — GLUCOSE, CAPILLARY
Glucose-Capillary: 114 mg/dL — ABNORMAL HIGH (ref 70–99)
Glucose-Capillary: 131 mg/dL — ABNORMAL HIGH (ref 70–99)
Glucose-Capillary: 167 mg/dL — ABNORMAL HIGH (ref 70–99)
Glucose-Capillary: 172 mg/dL — ABNORMAL HIGH (ref 70–99)
Glucose-Capillary: 174 mg/dL — ABNORMAL HIGH (ref 70–99)
Glucose-Capillary: 206 mg/dL — ABNORMAL HIGH (ref 70–99)

## 2019-09-22 LAB — URINE CULTURE: Culture: NO GROWTH

## 2019-09-22 LAB — PROTEIN / CREATININE RATIO, URINE
Creatinine, Urine: 88 mg/dL
Protein Creatinine Ratio: 0.41 mg/mg{Cre} — ABNORMAL HIGH (ref 0.00–0.15)
Total Protein, Urine: 36 mg/dL

## 2019-09-22 LAB — PROCALCITONIN: Procalcitonin: 0.98 ng/mL

## 2019-09-22 LAB — MAGNESIUM: Magnesium: 1.9 mg/dL (ref 1.7–2.4)

## 2019-09-22 LAB — STREP PNEUMONIAE URINARY ANTIGEN: Strep Pneumo Urinary Antigen: NEGATIVE

## 2019-09-22 LAB — PHOSPHORUS: Phosphorus: 3.8 mg/dL (ref 2.5–4.6)

## 2019-09-22 MED ORDER — HYDROCORTISONE NA SUCCINATE PF 100 MG IJ SOLR
50.0000 mg | Freq: Four times a day (QID) | INTRAMUSCULAR | Status: DC
  Administered 2019-09-22 – 2019-09-24 (×9): 50 mg via INTRAVENOUS
  Filled 2019-09-22 (×9): qty 2

## 2019-09-22 MED ORDER — VECURONIUM BROMIDE 10 MG IV SOLR: 10.0000 mg | Freq: Once | INTRAVENOUS | Status: DC

## 2019-09-22 MED ORDER — VASOPRESSIN 20 UNITS/100 ML INFUSION FOR SHOCK
0.0000 [IU]/min | INTRAVENOUS | Status: DC
  Administered 2019-09-22 (×2): 0.03 [IU]/min via INTRAVENOUS
  Filled 2019-09-22 (×3): qty 100

## 2019-09-22 NOTE — Progress Notes (Signed)
PHARMACY - PHYSICIAN COMMUNICATION CRITICAL VALUE ALERT - BLOOD CULTURE IDENTIFICATION (BCID)  Gregory Hall is an 64 y.o. male who presented to [facility] on 09/21/2019 with a chief complaint of unresponsiveness  Assessment: 89 YOM with encephalopathy and respiratory failure. Concern for AECOPD. 9/12 Blood culture 1/4 bottles with MRSE  Name of physician (or Provider) Contacted: Dr Belia Heman  Current antibiotics: ceftriaxone and azithromycin  Changes to prescribed antibiotics recommended:  Recommendations accepted by provider - suspected contaminant  Results for orders placed or performed during the hospital encounter of 09/21/19  Blood Culture ID Panel (Reflexed) (Collected: 09/21/2019  3:25 PM)  Result Value Ref Range   Enterococcus faecalis NOT DETECTED NOT DETECTED   Enterococcus Faecium NOT DETECTED NOT DETECTED   Listeria monocytogenes NOT DETECTED NOT DETECTED   Staphylococcus species DETECTED (A) NOT DETECTED   Staphylococcus aureus (BCID) NOT DETECTED NOT DETECTED   Staphylococcus epidermidis DETECTED (A) NOT DETECTED   Staphylococcus lugdunensis NOT DETECTED NOT DETECTED   Streptococcus species NOT DETECTED NOT DETECTED   Streptococcus agalactiae NOT DETECTED NOT DETECTED   Streptococcus pneumoniae NOT DETECTED NOT DETECTED   Streptococcus pyogenes NOT DETECTED NOT DETECTED   A.calcoaceticus-baumannii NOT DETECTED NOT DETECTED   Bacteroides fragilis NOT DETECTED NOT DETECTED   Enterobacterales NOT DETECTED NOT DETECTED   Enterobacter cloacae complex NOT DETECTED NOT DETECTED   Escherichia coli NOT DETECTED NOT DETECTED   Klebsiella aerogenes NOT DETECTED NOT DETECTED   Klebsiella oxytoca NOT DETECTED NOT DETECTED   Klebsiella pneumoniae NOT DETECTED NOT DETECTED   Proteus species NOT DETECTED NOT DETECTED   Salmonella species NOT DETECTED NOT DETECTED   Serratia marcescens NOT DETECTED NOT DETECTED   Haemophilus influenzae NOT DETECTED NOT DETECTED   Neisseria  meningitidis NOT DETECTED NOT DETECTED   Pseudomonas aeruginosa NOT DETECTED NOT DETECTED   Stenotrophomonas maltophilia NOT DETECTED NOT DETECTED   Candida albicans NOT DETECTED NOT DETECTED   Candida auris NOT DETECTED NOT DETECTED   Candida glabrata NOT DETECTED NOT DETECTED   Candida krusei NOT DETECTED NOT DETECTED   Candida parapsilosis NOT DETECTED NOT DETECTED   Candida tropicalis NOT DETECTED NOT DETECTED   Cryptococcus neoformans/gattii NOT DETECTED NOT DETECTED   Methicillin resistance mecA/C DETECTED (A) NOT DETECTED   Juliette Alcide, PharmD, BCPS.   Work Cell: [phone] 09/22/2019 11:48 AM

## 2019-09-22 NOTE — Procedures (Signed)
Hemodialysis catheter Insertion Procedure Note  Gregory Hall  [id number]  12-Jan-1955  Date:09/22/19  Time:1:20 PM   Provider Performing:Magddalene S Tukov-Yual   Procedure: Insertion of Non-tunneled Central Venous [id number]) with US guidance (75797)   Indication(s) Medication administration, Difficult access and Hemodialysis  Consent Unable to obtain consent due to emergent nature of procedure.  Anesthesia Topical only with 1% lidocaine   Timeout Verified patient identification, verified procedure, site/side was marked, verified correct patient position, special equipment/implants available, medications/allergies/relevant history reviewed, required imaging and test results available.  Sterile Technique Maximal sterile technique including full sterile barrier drape, hand hygiene, sterile gown, sterile gloves, mask, hair covering, sterile ultrasound probe cover (if used).  Procedure Description Area of catheter insertion was cleaned with chlorhexidine and draped in sterile fashion.  With real-time ultrasound guidance a HD catheter was placed into the right femoral vein. Nonpulsatile blood flow and easy flushing noted in all ports.  The catheter was sutured in place and sterile dressing applied.  Complications/Tolerance None; patient tolerated the procedure well.   Chest x-ray is not ordered for femoral cannulation.  EBL Minimal  Specimen(s) None  Procedure performed under direct supervision of Dr.Gonzalez. Ultrasound utilized for realtime vessel cannulation  Gregory Hall S. [facility] ANP-BC Pulmonary and Critical Care Medicine [facility] Pager [phone] or [phone]  NB: This document was prepared using Dragon voice recognition software and may include unintentional dictation errors.

## 2019-09-22 NOTE — Progress Notes (Addendum)
PHARMACY CONSULT NOTE - FOLLOW UP  Pharmacy Consult for Electrolyte Monitoring and Replacement   Recent Labs: Potassium (mmol/L)  Date Value  09/22/2019 4.3  03/31/2013 3.6   Magnesium (mg/dL)  Date Value  76/22/6333 1.9  06/25/2012 2.4   Calcium (mg/dL)  Date Value  54/56/2563 7.5 (L)   Calcium, Total (mg/dL)  Date Value  89/37/3428 7.6 (L)   Albumin (g/dL)  Date Value  76/81/1572 3.1 (L)  12/07/2014 3.6  03/30/2013 3.2 (L)   Phosphorus (mg/dL)  Date Value  62/03/5595 3.8  03/20/2012 3.4   Sodium (mmol/L)  Date Value  09/22/2019 136  03/31/2013 142  Corrected Calcium: 7.9   Assessment: Patient is a 64yo male admitted with severe AKI, hyperkalemia (K = 7.0) requiring emergent HD. Pharmacy consulted to assist with management of electrolytes.  9/12 K 7.0, emergent HD completed 9/13 K 4.3. Cr improved from 7.07 >> 3.13. UOP of 2.8 L yesterday. Plan for dialysis as needed.   Plan:  - No replacement needed at this time. - Recheck electrolytes with AM labs.  Levada Schilling PharmD Candidate 2022 09/22/2019 9:50 AM

## 2019-09-22 NOTE — H&P (Signed)
Gregory Hall is an 64 y.o. male.   Chief Complaint: Patient presented with unresponsiveness.  Level 5 caveat: Significant portions of the history and physical and review of systems have not been able to be completely obtained due to the patient's unresponsiveness and now intubated and mechanically ventilated status.  SYNOPSIS 64 year old male with a significant history as noted below with recent admission to [facility] on 3 September due to acute kidney injury who presented to [facility] ED today after being found unresponsive by caregiver at a group home.  The patient was brought via EMS.  The patient apparently has a history of COPD and chronically is on 2 L nasal cannula O2.   Initial oxygen saturation noted by EMS was 50% on arrival.  Initial blood pressure was 60/30.   EMS placed an intraosseous catheter and started IV fluids.  The patient was then placed on nonrebreather mask and brought to the emergency room.  In the emergency room he was evaluated and had to be subsequently intubated due to inability to protect airway, hypoxia and obtundation.  PCCM has been called to admit the patient.  The patient initially required norepinephrine infusion however this was subsequently weaned off. Patient tested negative for COVID-19.  CC follow up severe resp failure HPI Remains on vent Remains critically ill S/p emergent  HD   Past Medical History:  Diagnosis Date  . AKI (acute kidney injury) ([place]) 09/12/2019  . CAD (coronary artery disease)    s/p PTCA and stent x2  . Chest pain   . Chronic pain syndrome   . COPD (chronic obstructive pulmonary disease) ([place])   . Degenerative cervical disc   . Depression   . Diabetes mellitus without complication ([place])   . Dyslipidemia   . GERD (gastroesophageal reflux disease)   . Hernia 2014  . Hypertension   . MRSA (methicillin resistant staph aureus) culture positive 2011  . Neuropathy   . Nutcracker esophagus   . Rectus diastasis 07/19/2012      Allergies:  Allergies  Allergen Reactions  . Acetaminophen Other (See Comments)    Reaction:  Unknown  Kidney failure Told not to take from home M.D. Related to kidney and renal failure   . Nsaids Other (See Comments)    Reaction:  Unknown  Kidney failure Patient states not to take from home M.D. Related to kidney and renal failure.  Other reaction(s): Unknown   Current Outpatient Medications  Medication Instructions  . acetylcysteine (MUCOMYST) 20 % nebulizer solution 4 mLs, Nebulization, Every 7 days  . aspirin EC 81 mg, Oral, Daily  . atorvastatin (LIPITOR) 60 mg, Oral, Daily at bedtime  . baclofen (LIORESAL) 20 mg, Oral, Every 8 hours  . cyclobenzaprine (FLEXERIL) 5 mg, Oral, 3 times daily PRN  . DULoxetine (CYMBALTA) 60 mg, Oral, Daily  . Ensure Max Protein (ENSURE MAX PROTEIN) LIQD 11 oz, Oral, 2 times daily between meals  . Fluticasone-Umeclidin-Vilant (TRELEGY ELLIPTA) 100-62.5-25 MCG/INH AEPB 1 puff, Inhalation, Daily  . furosemide (LASIX) 20 mg, Oral, Daily  . guaiFENesin (MUCINEX) 600 mg, Oral, 2 times daily  . insulin glargine (LANTUS) 22 Units, Subcutaneous, Daily at bedtime  . insulin lispro (HUMALOG) 4 Units, Subcutaneous, 3 times daily before meals, Hold for FSBG <100  . ipratropium-albuterol (DUONEB) 0.5-2.5 (3) MG/3ML SOLN 3 mLs, Nebulization, Every 6 hours PRN  . lactulose (CHRONULAC) 20 g, Oral, Daily PRN  . lamoTRIgine (LAMICTAL) 100 mg, Oral, Daily  . lisinopril (ZESTRIL) 20 mg, Oral, Daily  . Menthol-Methyl Salicylate (  SALONPAS PAIN RELIEF PATCH EX) 1 patch, Transdermal, Every 12 hours PRN  . metFORMIN (GLUCOPHAGE-XR) 750 mg, Oral, 2 times daily  . metoprolol tartrate (LOPRESSOR) 12.5 mg, Oral, 2 times daily  . NAC 600 mg, Oral, Daily  . NITROSTAT 0.4 MG SL tablet DISSOLVE (1) TABLET UNDER TONGUE AS NEEDED TO RELIEVE CHEST PAIN. MAYREPEAT EVERY 5 MINUTES.  Marland Kitchen ondansetron (ZOFRAN) 4 mg, Oral, Every 8 hours PRN  . Oxycodone HCl 10 mg, Oral, 3 times  daily  . oxymetazoline (AFRIN) 0.05 % nasal spray 2 sprays, Each Nare, 2 times daily PRN  . pantoprazole (PROTONIX) 40 mg, Oral, Daily  . pregabalin (LYRICA) 200 mg, Oral, 3 times daily  . rivaroxaban (XARELTO) 20 mg, Oral, Daily with supper  . senna-docusate (SENOKOT-S) 8.6-50 MG tablet 1 tablet, Oral, Daily  . traZODone (DESYREL) 100 mg, Oral, Daily at bedtime  . Vitamin D3 2,000 Units, Oral, Daily    Current inpatient medications: Scheduled Meds: . chlorhexidine gluconate (MEDLINE KIT)  15 mL Mouth Rinse BID  . Chlorhexidine Gluconate Cloth  6 each Topical Q0600  . heparin  5,000 Units Subcutaneous Q8H  . insulin aspart  1-3 Units Subcutaneous Q4H  . ipratropium-albuterol  3 mL Nebulization Q6H  . mouth rinse  15 mL Mouth Rinse 10 times per day  . pantoprazole (PROTONIX) IV  40 mg Intravenous QHS   Continuous Infusions: . sodium chloride Stopped (09/21/19 1624)  . sodium chloride 75 mL/hr at 09/22/19 0815  . sodium chloride    . sodium chloride    . azithromycin Stopped (09/21/19 1644)  . cefTRIAXone (ROCEPHIN)  IV Stopped (09/21/19 1554)  . fentaNYL infusion INTRAVENOUS 100 mcg/hr (09/22/19 0816)  . norepinephrine (LEVOPHED) Adult infusion 31 mcg/min (09/22/19 0700)  . propofol (DIPRIVAN) infusion 20 mcg/kg/min (09/22/19 0700)   PRN Meds:.sodium chloride, sodium chloride, alteplase, docusate sodium, heparin, lidocaine (PF), lidocaine-prilocaine, midazolam, ondansetron (ZOFRAN) IV, pentafluoroprop-tetrafluoroeth, polyethylene glycol   Results for orders placed or performed during the hospital encounter of 09/21/19 (from the past 48 hour(s))  Blood Culture (routine x 2)     Status: None (Preliminary result)   Collection Time: 09/21/19  3:25 PM   Specimen: BLOOD  Result Value Ref Range   Specimen Description BLOOD LEFT ARM    Special Requests      BOTTLES DRAWN AEROBIC AND ANAEROBIC Blood Culture adequate volume   Culture      NO GROWTH < 24 HOURS Performed at [facility], [address] [place], [place] [address]    Report Status PENDING   Blood Culture (routine x 2)     Status: None (Preliminary result)   Collection Time: 09/21/19  3:25 PM   Specimen: BLOOD  Result Value Ref Range   Specimen Description BLOOD RIGHT ARM    Special Requests      BOTTLES DRAWN AEROBIC AND ANAEROBIC Blood Culture adequate volume   Culture      NO GROWTH < 24 HOURS Performed at [facility], [address] [place], [place] [address]    Report Status PENDING   Urinalysis, Complete w Microscopic Urine, Catheterized     Status: Abnormal   Collection Time: 09/21/19  3:25 PM  Result Value Ref Range   Color, Urine YELLOW (A) YELLOW   APPearance HAZY (A) CLEAR   Specific Gravity, Urine 1.016 1.005 - 1.030   pH 5.0 5.0 - 8.0   Glucose, UA NEGATIVE NEGATIVE mg/dL   Hgb urine dipstick NEGATIVE NEGATIVE   Bilirubin Urine  NEGATIVE NEGATIVE   Ketones, ur NEGATIVE NEGATIVE mg/dL   Protein, ur NEGATIVE NEGATIVE mg/dL   Nitrite NEGATIVE NEGATIVE   Leukocytes,Ua NEGATIVE NEGATIVE   RBC / HPF 0-5 0 - 5 RBC/hpf   WBC, UA 0-5 0 - 5 WBC/hpf   Bacteria, UA RARE (A) NONE SEEN   Squamous Epithelial / LPF 0-5 0 - 5   Mucus PRESENT     Comment: Performed at [facility], [address] [place], [place] [address]  Lactic acid, plasma     Status: None   Collection Time: 09/21/19  3:26 PM  Result Value Ref Range   Lactic Acid, Venous 1.6 0.5 - 1.9 mmol/L    Comment: Performed at [facility], [address] [place], [place] [address]  Comprehensive metabolic panel     Status: Abnormal   Collection Time: 09/21/19  3:26 PM  Result Value Ref Range   Sodium 129 (L) 135 - 145 mmol/L   Potassium 7.0 (HH) 3.5 - 5.1 mmol/L    Comment: CRITICAL RESULT CALLED TO, READ BACK BY AND VERIFIED WITH LEXIE OLIVER RN AT 1608 ON 09/21/19 SNG    Chloride 82 (L) 98 - 111 mmol/L   CO2 34 (H) 22 - 32 mmol/L   Glucose, Bld 142 (H) 70 - 99 mg/dL    Comment:  Glucose reference range applies only to samples taken after fasting for at least 8 hours.   BUN 127 (H) 8 - 23 mg/dL    Comment: RESULT CONFIRMED BY MANUAL DILUTION SNG   Creatinine, Ser 7.07 (H) 0.61 - 1.24 mg/dL   Calcium 8.0 (L) 8.9 - 10.3 mg/dL   Total Protein 6.1 (L) 6.5 - 8.1 g/dL   Albumin 3.1 (L) 3.5 - 5.0 g/dL   AST 24 15 - 41 U/L   ALT 18 0 - 44 U/L   Alkaline Phosphatase 79 38 - 126 U/L   Total Bilirubin 0.9 0.3 - 1.2 mg/dL   GFR calc non Af Amer 7 (L) >60 mL/min   GFR calc Af Amer 9 (L) >60 mL/min   Anion gap 13 5 - 15    Comment: Performed at [facility], [address] [place], [place] [address]  CBC WITH DIFFERENTIAL     Status: Abnormal   Collection Time: 09/21/19  3:26 PM  Result Value Ref Range   WBC 11.7 (H) 4.0 - 10.5 K/uL   RBC 4.13 (L) 4.22 - 5.81 MIL/uL   Hemoglobin 11.5 (L) 13.0 - 17.0 g/dL   HCT 35.5 (L) 39 - 52 %   MCV 86.0 80.0 - 100.0 fL   MCH 27.8 26.0 - 34.0 pg   MCHC 32.4 30.0 - 36.0 g/dL   RDW 17.9 (H) 11.5 - 15.5 %   Platelets 328 150 - 400 K/uL   nRBC 0.2 0.0 - 0.2 %   Neutrophils Relative % 83 %   Neutro Abs 9.6 (H) 1.7 - 7.7 K/uL   Lymphocytes Relative 5 %   Lymphs Abs 0.6 (L) 0.7 - 4.0 K/uL   Monocytes Relative 12 %   Monocytes Absolute 1.4 (H) 0 - 1 K/uL   Eosinophils Relative 0 %   Eosinophils Absolute 0.0 0 - 0 K/uL   Basophils Relative 0 %   Basophils Absolute 0.0 0 - 0 K/uL   Immature Granulocytes 0 %   Abs Immature Granulocytes 0.05 0.00 - 0.07 K/uL    Comment: Performed at [facility], [address], [place], [place] [address]  Protime-INR     Status: Abnormal   Collection Time: 09/21/19  3:26 PM  Result Value Ref Range   Prothrombin Time 15.4 (H) 11.4 - 15.2 seconds   INR 1.3 (H) 0.8 - 1.2    Comment: (NOTE) INR goal varies based on device and disease states. Performed at [facility], [address] [place], [place] [address]   APTT     Status: None   Collection Time: 09/21/19  3:26 PM   Result Value Ref Range   aPTT 33 24 - 36 seconds    Comment: Performed at [facility], [address] [place], [place] [address]  Fibrin derivatives D-Dimer     Status: Abnormal   Collection Time: 09/21/19  3:26 PM  Result Value Ref Range   Fibrin derivatives D-dimer ([facility]) 712.01 (H) 0.00 - 499.00 ng/mL (FEU)    Comment: (NOTE) <> Exclusion of Venous Thromboembolism (VTE) - OUTPATIENT ONLY   (Emergency Department or [place])    0-499 ng/ml (FEU): With a low to intermediate pretest probability                      for VTE this test result excludes the diagnosis                      of VTE.   >499 ng/ml (FEU) : VTE not excluded; additional work up for VTE is                      required.  <> Testing on Inpatients and Evaluation of Disseminated Intravascular   Coagulation (DIC) Reference Range:   0-499 ng/ml (FEU) Performed at [facility], [address] [place], [place] [address]   Troponin I (High Sensitivity)     Status: Abnormal   Collection Time: 09/21/19  3:26 PM  Result Value Ref Range   Troponin I (High Sensitivity) 29 (H) <18 ng/L    Comment: (NOTE) Elevated high sensitivity troponin I (hsTnI) values and significant  changes across serial measurements may suggest ACS but many other  chronic and acute conditions are known to elevate hsTnI results.  Refer to the "Links" section for chest pain algorithms and additional  guidance. Performed at [facility] [address] [place], [place] [address]   CK     Status: None   Collection Time: 09/21/19  3:26 PM  Result Value Ref Range   Total CK 289 49.0 - 397.0 U/L    Comment: Performed at [facility], [address] [place], [place] [address]  Phosphorus     Status: Abnormal   Collection Time: 09/21/19  3:26 PM  Result Value Ref Range   Phosphorus 7.8 (H) 2.5 - 4.6 mg/dL    Comment: Performed at [facility], [address] [place], [place] [address]  Brain  natriuretic peptide     Status: Abnormal   Collection Time: 09/21/19  3:27 PM  Result Value Ref Range   B Natriuretic Peptide 247.5 (H) 0.0 - 100.0 pg/mL    Comment: Performed at [facility], [address] [place], [place] [address]  Blood gas, venous     Status: Abnormal   Collection Time: 09/21/19  3:27 PM  Result Value Ref Range   pH, Ven 7.24 (L) 7.25 - 7.43   pCO2, Ven 77 (HH) 44 - 60 mmHg    Comment: CRITICAL RESULT CALLED TO, READ BACK BY AND VERIFIED WITH: CRITICAL  CALUE 09/21/19,1645,DR. STAFFORD/FD    pO2, Ven 45.0 32 - 45 mmHg   Bicarbonate 33.0 (H) 20.0 - 28.0 mmol/L   Acid-Base Excess 3.7 (H) 0.0 - 2.0 mmol/L   O2 Saturation 71.8 %   Patient temperature 37.0    Collection site VEIN    Sample type VEIN     Comment: Performed at [facility], [address] [place], [place] [address]  SARS Coronavirus 2 by RT PCR (hospital order, performed in [facility] hospital lab) Nasopharyngeal Nasopharyngeal Swab     Status: None   Collection Time: 09/21/19  3:28 PM   Specimen: Nasopharyngeal Swab  Result Value Ref Range   SARS Coronavirus 2 NEGATIVE NEGATIVE    Comment: (NOTE) SARS-CoV-2 target nucleic acids are NOT DETECTED.  The SARS-CoV-2 RNA is generally detectable in upper and lower respiratory specimens during the acute phase of infection. The lowest concentration of SARS-CoV-2 viral copies this assay can detect is 250 copies / mL. A negative result does not preclude SARS-CoV-2 infection and should not be used as the sole basis for treatment or other patient management decisions.  A negative result may occur with improper specimen collection / handling, submission of specimen other than nasopharyngeal swab, presence of viral mutation(s) within the areas targeted by this assay, and inadequate number of viral copies (<250 copies / mL). A negative result must be combined with clinical observations, patient history, and epidemiological  information.  Fact Sheet for Patients:   StrictlyIdeas.no  Fact Sheet for Healthcare Providers: BankingDealers.co.za  This test is not yet approved or  cleared by the [place] FDA and has been authorized for detection and/or diagnosis of SARS-CoV-2 by FDA under an Emergency Use Authorization (EUA).  This EUA will remain in effect (meaning this test can be used) for the duration of the COVID-19 declaration under Section 564(b)(1) of the Act, 21 U.S.C. section 360bbb-3(b)(1), unless the authorization is terminated or revoked sooner.  Performed at [facility], [address] [place], [place] [address]   Glucose, capillary     Status: Abnormal   Collection Time: 09/21/19  6:59 PM  Result Value Ref Range   Glucose-Capillary 147 (H) 70 - 99 mg/dL    Comment: Glucose reference range applies only to samples taken after fasting for at least 8 hours.  Blood gas, arterial     Status: Abnormal   Collection Time: 09/21/19  7:01 PM  Result Value Ref Range   FIO2 0.50    Delivery systems VENTILATOR    Mode ASSIST CONTROL    VT 500 mL   LHR 18 resp/min   Peep/cpap 5.0 cm H20   pH, Arterial 7.30 (L) 7.35 - 7.45   pCO2 arterial 58 (H) 32 - 48 mmHg   pO2, Arterial 67 (L) 83 - 108 mmHg   Bicarbonate 28.5 (H) 20.0 - 28.0 mmol/L   Acid-Base Excess 1.1 0.0 - 2.0 mmol/L   O2 Saturation 90.8 %   Patient temperature 37.0    Collection site RIGHT RADIAL    Sample type ARTERIAL DRAW    Allens test (pass/fail) PASS PASS   Mechanical Rate 18     Comment: Performed at [facility], [address] [place], [place] [address]  Troponin I (High Sensitivity)     Status: Abnormal   Collection Time: 09/21/19  7:20 PM  Result Value Ref Range   Troponin I (High Sensitivity) 21 (H) <18 ng/L    Comment: (NOTE) Elevated high sensitivity troponin I (hsTnI)  values and significant  changes across serial measurements may suggest ACS but  many other  chronic and acute conditions are known to elevate hsTnI results.  Refer to the "Links" section for chest pain algorithms and additional  guidance. Performed at [facility], [address] [place], [place] [address]   Triglycerides     Status: None   Collection Time: 09/21/19  7:20 PM  Result Value Ref Range   Triglycerides 88 <150 mg/dL    Comment: Performed at [facility], [address] [place], [place] [address]  Urine Drug Screen, Qualitative [facility] only)     Status: Abnormal   Collection Time: 09/21/19  7:20 PM  Result Value Ref Range   Tricyclic, Ur Screen NONE DETECTED NONE DETECTED   Amphetamines, Ur Screen NONE DETECTED NONE DETECTED   MDMA (Ecstasy)Ur Screen NONE DETECTED NONE DETECTED   Cocaine Metabolite,Ur [place] NONE DETECTED NONE DETECTED   Opiate, Ur Screen POSITIVE (A) NONE DETECTED   Phencyclidine (PCP) Ur S NONE DETECTED NONE DETECTED   Cannabinoid 50 Ng, Ur [place] NONE DETECTED NONE DETECTED   Barbiturates, Ur Screen NONE DETECTED NONE DETECTED   Benzodiazepine, Ur Scrn NONE DETECTED NONE DETECTED   Methadone Scn, Ur NONE DETECTED NONE DETECTED    Comment: (NOTE) Tricyclics + metabolites, urine    Cutoff 1000 ng/mL Amphetamines + metabolites, urine  Cutoff 1000 ng/mL MDMA (Ecstasy), urine              Cutoff 500 ng/mL Cocaine Metabolite, urine          Cutoff 300 ng/mL Opiate + metabolites, urine        Cutoff 300 ng/mL Phencyclidine (PCP), urine         Cutoff 25 ng/mL Cannabinoid, urine                 Cutoff 50 ng/mL Barbiturates + metabolites, urine  Cutoff 200 ng/mL Benzodiazepine, urine              Cutoff 200 ng/mL Methadone, urine                   Cutoff 300 ng/mL  The urine drug screen provides only a preliminary, unconfirmed analytical test result and should not be used for non-medical purposes. Clinical consideration and professional judgment should be applied to any positive drug screen result due to  possible interfering substances. A more specific alternate chemical method must be used in order to obtain a confirmed analytical result. Gas chromatography / mass spectrometry (GC/MS) is the preferred confirm atory method. Performed at [facility], [address] [place],  [address]   TSH     Status: Abnormal   Collection Time: 09/21/19  7:20 PM  Result Value Ref Range   TSH 0.256 (L) 0.350 - 4.500 uIU/mL    Comment: Performed by a 3rd Generation assay with a functional sensitivity of <=0.01 uIU/mL. Performed at [facility], [address] [place],  [address]   MRSA PCR Screening     Status: None   Collection Time: 09/21/19 10:16 PM   Specimen: Nasal Mucosa; Nasopharyngeal  Result Value Ref Range   MRSA by PCR NEGATIVE NEGATIVE    Comment:        The GeneXpert MRSA Assay (FDA approved for NASAL specimens only), is one component of a comprehensive MRSA colonization surveillance program. It is not intended to diagnose MRSA infection nor to guide or monitor treatment for MRSA infections. Performed at [facility], [place], [place] [address]   Glucose, capillary     Status: Abnormal   Collection Time: 09/21/19 11:45 PM  Result Value Ref Range   Glucose-Capillary 152 (H) 70 - 99 mg/dL    Comment: Glucose reference range applies only to samples taken after fasting for at least 8 hours.  Glucose, capillary     Status: Abnormal   Collection Time: 09/22/19  4:42 AM  Result Value Ref Range   Glucose-Capillary 131 (H) 70 - 99 mg/dL    Comment: Glucose reference range applies only to samples taken after fasting for at least 8 hours.  CBC     Status: Abnormal   Collection Time: 09/22/19  5:15 AM  Result Value Ref Range   WBC 10.0 4.0 - 10.5 K/uL   RBC 3.99 (L) 4.22 - 5.81 MIL/uL   Hemoglobin 11.1 (L) 13.0 - 17.0 g/dL   HCT 34.8 (L) 39 - 52 %   MCV 87.2 80.0 - 100.0 fL   MCH 27.8 26.0 - 34.0 pg   MCHC 31.9 30.0 -  36.0 g/dL   RDW 18.6 (H) 11.5 - 15.5 %   Platelets 353 150 - 400 K/uL   nRBC 0.2 0.0 - 0.2 %    Comment: Performed at [facility], [address], [place], [place] [address]  Magnesium     Status: None   Collection Time: 09/22/19  5:15 AM  Result Value Ref Range   Magnesium 1.9 1.7 - 2.4 mg/dL    Comment: Performed at [facility], [address] [place], [place] [address]  Phosphorus     Status: None   Collection Time: 09/22/19  5:15 AM  Result Value Ref Range   Phosphorus 3.8 2.5 - 4.6 mg/dL    Comment: Performed at [facility], [address] [place], [place] [address]  Basic metabolic panel     Status: Abnormal   Collection Time: 09/22/19  5:15 AM  Result Value Ref Range   Sodium 136 135 - 145 mmol/L   Potassium 4.3 3.5 - 5.1 mmol/L   Chloride 95 (L) 98 - 111 mmol/L   CO2 31 22 - 32 mmol/L   Glucose, Bld 135 (H) 70 - 99 mg/dL    Comment: Glucose reference range applies only to samples taken after fasting for at least 8 hours.   BUN 69 (H) 8 - 23 mg/dL   Creatinine, Ser 3.13 (H) 0.61 - 1.24 mg/dL   Calcium 7.5 (L) 8.9 - 10.3 mg/dL   GFR calc non Af Amer 20 (L) >60 mL/min   GFR calc Af Amer 23 (L) >60 mL/min   Anion gap 10 5 - 15    Comment: Performed at [facility], [address] [place], [place] [address]  Blood gas, arterial     Status: Abnormal   Collection Time: 09/22/19  5:37 AM  Result Value Ref Range   FIO2 50.00    VT 500 mL   Peep/cpap 5.0 cm H20   pH, Arterial 7.27 (L) 7.35 - 7.45   pCO2 arterial 70 (HH) 32 - 48 mmHg    Comment: CRITICAL RESULT, NOTIFIED PHYSICIAN M TUKOV [id number] DT    pO2, Arterial 70 (L) 83 - 108 mmHg   Bicarbonate 32.1 (H) 20.0 - 28.0 mmol/L   Acid-Base Excess 3.5 (H) 0.0 - 2.0 mmol/L   O2 Saturation 91.2 %   Patient temperature 37.0    Collection site RIGHT RADIAL  Sample type ARTERIAL DRAW    Allens test (pass/fail) PASS PASS   Mechanical Rate 18     Comment: Performed at  [facility], [address] [place], [place] [address]  Glucose, capillary     Status: Abnormal   Collection Time: 09/22/19  7:25 AM  Result Value Ref Range   Glucose-Capillary 114 (H) 70 - 99 mg/dL    Comment: Glucose reference range applies only to samples taken after fasting for at least 8 hours.   DG Chest Port 1 View  Result Date: 09/22/2019 CLINICAL DATA:  Hypoxia EXAM: PORTABLE CHEST 1 VIEW COMPARISON:  September 21, 2019 FINDINGS: Endotracheal tube tip is 8.4 cm above the carina. Nasogastric tube tip and side port are below the diaphragm. Stimulator leads are overlying the midthoracic region. No pneumothorax. There is atelectatic change in the lung bases with partial clearing of airspace opacity from the bases. No new opacity evident. Heart is upper normal in size with pulmonary vascularity normal. No adenopathy. There is a stent in the left subclavian artery region. There is aortic atherosclerosis. No bone lesions. IMPRESSION: Tube and catheter positions as described without pneumothorax. Bibasilar atelectasis with partial clearing of airspace opacity from the bases. No new opacity. Stable cardiac silhouette. Aortic Atherosclerosis (ICD10-I70.0). Electronically Signed   By: Lowella Grip III M.D.   On: 09/22/2019 08:05   DG Chest Portable 1 View  Result Date: 09/21/2019 CLINICAL DATA:  Post intubation. EXAM: PORTABLE CHEST 1 VIEW COMPARISON:  09/21/2019 FINDINGS: There is a worsening airspace opacity at the right lung base. The endotracheal tube terminates above the carina by approximately 4.5 cm. The enteric tube appears to terminate at the level of the diaphragm. Coarse airspace opacities are noted at the lung bases. There is no pneumothorax or significant pleural effusion. A left subclavian artery stent is noted the lungs are hyperexpanded. Emphysematous changes are again noted. IMPRESSION: 1. Lines and tubes as above. The enteric tube is not appear to extend into the  stomach. Repositioning is recommended. 2. New airspace opacity at the right lung base concerning for worsening atelectasis or a developing infiltrate. There is persistent atelectasis at the left lung base. These results will be called to the ordering clinician or representative by the Radiologist Assistant, and communication documented in the PACS or [employer]. Electronically Signed   By: Constance Holster M.D.   On: 09/21/2019 18:30   DG Chest Port 1 View  Result Date: 09/21/2019 CLINICAL DATA:  64 year old with possible sepsis and acute mental status changes. Patient lethargic and unresponsive. Hypotension. Current smoker. Follow-up pneumonia. EXAM: PORTABLE CHEST 1 VIEW COMPARISON:  09/12/2019 and earlier, including CTA chest 06/12/2019. FINDINGS: Cardiac silhouette normal in size for AP portable technique, unchanged. Thoracic aorta atherosclerotic, unchanged. Prominent central pulmonary arteries, unchanged. Since the most recent prior examination 9 days ago, improved aeration in the lung bases with near complete resolution of the airspace opacities present at that time. Residual mild linear atelectasis at the RIGHT lung base. No new pulmonary parenchymal abnormalities. Emphysematous changes as noted on the prior CT. Dorsal cord stimulating device overlies the mid thoracic spine, unchanged. IMPRESSION: Near complete resolution of the bibasilar pneumonia since the most recent prior examination 9 days ago. Residual mild linear atelectasis at the RIGHT lung base. Electronically Signed   By: Evangeline Dakin M.D.   On: 09/21/2019 16:05    Review of Systems  Unable to perform ROS: Intubated   Vent Mode: PRVC FiO2 (%):  [50 %] 50 %  Set Rate:  [18 bmp-24 bmp] 24 bmp Vt Set:  [500 mL] 500 mL PEEP:  [5 cmH20] 5 cmH20   Blood pressure (!) 133/51, pulse 70, temperature 99.7 F (37.6 C), temperature source Oral, resp. rate (!) 25, height 6' 2"  (1.88 m), weight 95.5 kg, SpO2 96 %. Physical Exam     PHYSICAL EXAMINATION:  GENERAL:critically ill appearing, +resp distress HEAD: Normocephalic, atraumatic.  EYES: Pupils equal, round, reactive to light.  No scleral icterus.  MOUTH: Moist mucosal membrane. NECK: Supple. No thyromegaly. No nodules. No JVD.  PULMONARY: +rhonchi, +wheezing CARDIOVASCULAR: S1 and S2. Regular rate and rhythm. No murmurs, rubs, or gallops.  GASTROINTESTINAL: Soft, nontender, -distended. Positive bowel sounds.  MUSCULOSKELETAL: No swelling, clubbing, or edema.  NEUROLOGIC: obtunded SKIN:intact,warm,dry   Assessment/Plan  Severe ACUTE Hypoxic and Hypercapnic Respiratory Failure due to pulm edema and acute COPD exacerbation -continue Mechanical Ventilator support -continue Bronchodilator Therapy -Wean Fio2 and PEEP as tolerated -VAP/VENT bundle implementation -will perform SAT/SBT when respiratory parameters are met   ACUTE KIDNEY INJURY/Renal Failure -continue Foley Catheter-assess need -Avoid nephrotoxic agents -Follow urine output, BMP -Ensure adequate renal perfusion, optimize oxygenation -Renal dose medications Hd as needed   Metabolic encephalopathy Acute toxic metabolic encephalopathy, need for sedation Goal RASS -2 to -3   SEVERE COPD EXACERBATION -continue IV steroids as prescribed -continue NEB THERAPY as prescribed -morphine as needed -wean fio2 as needed and tolerated  ENDO - ICU hypoglycemic\Hyperglycemia protocol -check FSBS per protocol  Diabetes mellitus ICU hyperglycemia protocol   DVT/GI PRX ordered and assessed TRANSFUSIONS AS NEEDED MONITOR FSBS I Assessed the need for Labs I Assessed the need for Foley I Assessed the need for Central Venous Line Family Discussion when available I Assessed the need for Mobilization I made an Assessment of medications to be adjusted accordingly Safety Risk assessment completed  CASE DISCUSSED IN MULTIDISCIPLINARY ROUNDS WITH ICU TEAM    Critical Care Time devoted to  patient care services described in this note is 35 minutes.   Overall, patient is critically ill, prognosis is guarded.  Patient with Multiorgan failure and at high risk for cardiac arrest and death.    Corrin Parker, M.D.  [facility]  Medical Director [place] Director [facility]

## 2019-09-22 NOTE — Progress Notes (Signed)
Initial Nutrition Assessment  DOCUMENTATION CODES:   Not applicable  INTERVENTION:   Once tube feeds appropriate, recommend:  Vital 1.2 @55ml /hr + PS 48ml QID via tube  Free water flushes 58ml q4 hours to maintain tube patency   Propofol: 17.8 ml/hr- provides 470kcal/day   Regimen provides 2214kcal/day, 143g/day protein and 1267ml/day free water   NUTRITION DIAGNOSIS:   Inadequate oral intake related to inability to eat (pt sedated and ventilated) as evidenced by NPO status.  GOAL:   Provide needs based on ASPEN/SCCM guidelines  MONITOR:   Vent status, Labs, Weight trends, Skin, I & O's  REASON FOR ASSESSMENT:   Ventilator    ASSESSMENT:   64 y.o. male with a PMHx of coronary artery disease status post PTCA and stent x2, COPD, degenerative disc disease in the cervical spine, depression, diabetes mellitus type 2, GERD, hypertension, peripheral neuropathy and nutcracker esophagus who was admitted to [facility] on 09/21/2019 for evaluation of unresponsiveness and was found to have severe AKI requiring emergent HD 9/12   Pt sedated and ventilated. OGT in place. Pt is known to nutrition department from a recent previous admit. Pt eating anywhere from 25-100% of meals during his last admit. Pt reported at that time that he has good appetite and oral intake at baseline but that his oral intake had been decreased as of recent r/t SOB.   Patient reported previously that he is weight-stable and denies any recent weight loss. He reports his UBW is 210-212 lbs.  Medications reviewed and include: heparin, insulin, protonix, NaCl @75ml /hr, azithromycin, ceftriaxone, fentanyl, levophed, propofol, vasopressin  Labs reviewed: BUN 69(H), creat 3.13(H), P 3.8 wnl, Mg 1.9 wnl cbgs- 131, 114, 167 x 24 hrs  Patient is currently intubated on ventilator support MV: 11.2 L/min Temp (24hrs), Avg:98.3 F (36.8 C), Min:97.5 F (36.4 C), Max:99.7 F (37.6 C)  Propofol: 17.8 ml/hr- provides  470kcal/day   MAP- 60-52mmHg  UOP-   NUTRITION - FOCUSED PHYSICAL EXAM:    Most Recent Value  Orbital Region No depletion  Upper Arm Region Mild depletion  Thoracic and Lumbar Region No depletion  Buccal Region No depletion  Temple Region Mild depletion  Clavicle Bone Region No depletion  Clavicle and Acromion Bone Region No depletion  Scapular Bone Region No depletion  Dorsal Hand Mild depletion  Patellar Region Moderate depletion  Anterior Thigh Region Moderate depletion  Posterior Calf Region Severe depletion  Edema (RD Assessment) Mild  Hair Reviewed  Eyes Reviewed  Mouth Reviewed  Skin Reviewed  Nails Reviewed     Diet Order:   Diet Order            Diet NPO time specified  Diet effective now                EDUCATION NEEDS:   No education needs have been identified at this time  Skin:  Skin Assessment: Reviewed RN Assessment (ecchymosis)  Last BM:  pta  Height:   Ht Readings from Last 1 Encounters:  09/21/19 6\' 2"  (1.88 m)    Weight:   Wt Readings from Last 1 Encounters:  09/22/19 95.5 kg    Ideal Body Weight:  86.3 kg  BMI:  Body mass index is 27.03 kg/m.  Estimated Nutritional Needs:   Kcal:  2146kcal/day  Protein:  135-150g/day  Fluid:  2.2-2.5L/day  11/21/19 MS, RD, LDN Please refer to [facility] for RD and/or RD on-call/weekend/after hours pager

## 2019-09-22 NOTE — Consult Note (Signed)
Consultation Note Date: 09/22/2019   Patient Name: Gregory Hall  DOB: [birth year]  MRN: [id number]  Age / Sex: 64 y.o., male  PCP: Remi Haggard, FNP Referring Physician: Flora Lipps, MD  Reason for Consultation: Establishing goals of care  HPI/Patient Profile: 64 year old male with a significant history as noted below with recent admission to [facility] on 3 September due to acute kidney injury who presented to [facility] ED today after being found unresponsive by caregiver at a group home.  The patient was brought via EMS.  The patient apparently has a history of COPD and chronically is on 2 L nasal cannula O2.  Clinical Assessment and Goals of Care: Patient is resting in bed on ventilator. Spoke with his sister. Sister states there are 3 siblings, and they have a sister. She states the sisters are his next of kin and there is no HPOA.  he has been in and out of group homes for the past 10 years. She states he is functionally independent. He "comes to the hospital for tune ups".    We discussed his diagnoses, prognosis, and GOC.  A detailed discussion was had today regarding advanced directives.  Concepts specific to code status, artifical feeding and hydration, IV antibiotics and rehospitalization were discussed.  The difference between an aggressive medical intervention path and a comfort care path was discussed.  Values and goals of care important to patient and family were attempted to be elicited.  Discussed limitations of medical interventions to prolong quality of life in some situations and discussed the concept of human mortality.  She states she will speak with her sister about steps moving forward. She states the doctor had discussed no CPR with her, and she is in agreement. Continue current ventilator support. MOST form completed.    I completed a MOST form today and the signed original was  placed in the chart. A photocopy was placed in the chart to be scanned into EMR. The patient outlined their wishes for the following treatment decisions:  Cardiopulmonary Resuscitation: Do Not Attempt Resuscitation (DNR/No CPR)  Medical Interventions: Full Scope of Treatment: Use intubation, advanced airway interventions, mechanical ventilation, cardioversion as indicated, medical treatment, IV fluids, etc, also provide comfort measures. Transfer to the hospital if indicated  Antibiotics: Antibiotics if indicated  IV Fluids: IV fluids if indicated  Feeding Tube: Feeding tube for a defined trial period       Will return tomorrow morning.  SUMMARY OF RECOMMENDATIONS   No chest compressions. Continue current ventilator support.  Prognosis:   Poor overall      Primary Diagnoses: Present on Admission: **None**   I have reviewed the medical record, interviewed the patient and family, and examined the patient. The following aspects are pertinent.  Past Medical History:  Diagnosis Date  . AKI (acute kidney injury) ([name]) 09/12/2019  . CAD (coronary artery disease)    s/p PTCA and stent x2  . Chest pain   . Chronic pain syndrome   . COPD (chronic obstructive pulmonary disease) ([name])   .  Degenerative cervical disc   . Depression   . Diabetes mellitus without complication ([name])   . Dyslipidemia   . GERD (gastroesophageal reflux disease)   . Hernia 2014  . Hypertension   . MRSA (methicillin resistant staph aureus) culture positive 2011  . Neuropathy   . Nutcracker esophagus   . Rectus diastasis 07/19/2012   Social History   Socioeconomic History  . Marital status: Single    Spouse name: Not on file  . Number of children: Not on file  . Years of education: Not on file  . Highest education level: Not on file  Occupational History  . Not on file  Tobacco Use  . Smoking status: Current Every Day Smoker    Packs/day: 0.50    Years: 30.00    Pack years: 15.00  . Smokeless  tobacco: Never Used  Substance and Sexual Activity  . Alcohol use: No  . Drug use: No  . Sexual activity: Not on file  Other Topics Concern  . Not on file  Social History Narrative  . Not on file   Social Determinants of Health   Financial Resource Strain:   . Difficulty of Paying Living Expenses: Not on file  Food Insecurity:   . Worried About Charity fundraiser in the Last Year: Not on file  . Ran Out of Food in the Last Year: Not on file  Transportation Needs:   . Lack of Transportation (Medical): Not on file  . Lack of Transportation (Non-Medical): Not on file  Physical Activity:   . Days of Exercise per Week: Not on file  . Minutes of Exercise per Session: Not on file  Stress:   . Feeling of Stress : Not on file  Social Connections:   . Frequency of Communication with Friends and Family: Not on file  . Frequency of Social Gatherings with Friends and Family: Not on file  . Attends Religious Services: Not on file  . Active Member of Clubs or Organizations: Not on file  . Attends Archivist Meetings: Not on file  . Marital Status: Not on file   Family History  Family history unknown: Yes   Scheduled Meds: . chlorhexidine gluconate (MEDLINE KIT)  15 mL Mouth Rinse BID  . Chlorhexidine Gluconate Cloth  6 each Topical Q0600  . heparin  5,000 Units Subcutaneous Q8H  . hydrocortisone sod succinate (SOLU-CORTEF) inj  50 mg Intravenous Q6H  . insulin aspart  1-3 Units Subcutaneous Q4H  . ipratropium-albuterol  3 mL Nebulization Q6H  . mouth rinse  15 mL Mouth Rinse 10 times per day  . pantoprazole (PROTONIX) IV  40 mg Intravenous QHS   Continuous Infusions: . sodium chloride Stopped (09/21/19 1624)  . sodium chloride 75 mL/hr at 09/22/19 1500  . sodium chloride    . sodium chloride    . azithromycin Stopped (09/21/19 1644)  . cefTRIAXone (ROCEPHIN)  IV 2 g (09/22/19 1549)  . fentaNYL infusion INTRAVENOUS 100 mcg/hr (09/22/19 1500)  . norepinephrine  (LEVOPHED) Adult infusion 30 mcg/min (09/22/19 1500)  . propofol (DIPRIVAN) infusion 30 mcg/kg/min (09/22/19 1500)  . vasopressin 0.03 Units/min (09/22/19 1500)   PRN Meds:.sodium chloride, sodium chloride, alteplase, docusate sodium, heparin, lidocaine (PF), lidocaine-prilocaine, midazolam, ondansetron (ZOFRAN) IV, pentafluoroprop-tetrafluoroeth, polyethylene glycol Medications Prior to Admission:  Prior to Admission medications   Medication Sig Start Date End Date Taking? Authorizing Provider  acetylcysteine (MUCOMYST) 20 % nebulizer solution Take 4 mLs by nebulization every 7 (seven) days. 09/16/19  Yes  [provider]  aspirin EC 81 MG tablet Take 81 mg by mouth daily.   Yes [provider]  atorvastatin (LIPITOR) 20 MG tablet Take 60 mg by mouth at bedtime.    Yes [provider]  baclofen (LIORESAL) 20 MG tablet Take 20 mg by mouth every 8 (eight) hours. 09/02/19  Yes [provider]  Cholecalciferol (VITAMIN D3) 50 MCG (2000 UT) TABS Take 2,000 Units by mouth daily.    Yes [provider]  cyclobenzaprine (FLEXERIL) 5 MG tablet Take 1 tablet (5 mg total) by mouth 3 (three) times daily as needed. Patient taking differently: Take 5 mg by mouth 2 (two) times daily as needed for muscle spasms.  05/25/19  Yes Menshew, Dannielle Karvonen, PA-C  DULoxetine (CYMBALTA) 60 MG capsule Take 60 mg by mouth daily.   Yes [provider]  Ensure Max Protein (ENSURE MAX PROTEIN) LIQD Take 330 mLs (11 oz total) by mouth 2 (two) times daily between meals. 06/14/19  Yes Sheikh, Omair Latif, DO  Fluticasone-Umeclidin-Vilant (TRELEGY ELLIPTA) 100-62.5-25 MCG/INH AEPB Inhale 1 puff into the lungs daily.   Yes [provider]  furosemide (LASIX) 20 MG tablet Take 1 tablet (20 mg total) by mouth daily. Patient taking differently: Take 20 mg by mouth daily as needed for fluid.  09/14/19  Yes Danford, Suann Larry, MD  guaiFENesin (MUCINEX) 600 MG 12 hr tablet Take  600 mg by mouth 2 (two) times daily.   Yes [provider]  insulin glargine (LANTUS) 100 UNIT/ML injection Inject 22 Units into the skin at bedtime.    Yes [provider]  insulin lispro (HUMALOG) 100 UNIT/ML injection Inject 4 Units into the skin 3 (three) times daily before meals. Hold for FSBG <100   Yes [provider]  ipratropium-albuterol (DUONEB) 0.5-2.5 (3) MG/3ML SOLN Take 3 mLs by nebulization every 6 (six) hours as needed. Patient taking differently: Take 3 mLs by nebulization every 6 (six) hours as needed (wheezing and shortness of breath).  06/14/19  Yes Sheikh, Omair Latif, DO  lactulose (CHRONULAC) 10 GM/15ML solution Take 20 g by mouth daily as needed for mild constipation.   Yes [provider]  lamoTRIgine (LAMICTAL) 100 MG tablet Take 100 mg by mouth daily.   Yes [provider]  Menthol-Methyl Salicylate (SALONPAS PAIN RELIEF PATCH EX) Place 1 patch onto the skin every 12 (twelve) hours as needed (aches and pains).   Yes [provider]  metFORMIN (GLUCOPHAGE-XR) 750 MG 24 hr tablet Take 1 tablet (750 mg total) by mouth 2 (two) times daily. 09/16/19  Yes Danford, Suann Larry, MD  metoprolol tartrate (LOPRESSOR) 25 MG tablet Take 12.5 mg by mouth 2 (two) times daily.   Yes [provider]  NAC 600 MG CAPS Take 600 mg by mouth daily. 09/08/19  Yes [provider]  NITROSTAT 0.4 MG SL tablet DISSOLVE (1) TABLET UNDER TONGUE AS NEEDED TO RELIEVE CHEST PAIN. MAYREPEAT EVERY 5 MINUTES. Patient taking differently: Place 0.4 mg under the tongue every 5 (five) minutes as needed for chest pain.  10/06/15  Yes Wellington Hampshire, MD  ondansetron (ZOFRAN) 4 MG tablet Take 4 mg by mouth every 8 (eight) hours as needed for nausea or vomiting.    Yes [provider]  Oxycodone HCl 10 MG TABS Take 1 tablet (10 mg total) by mouth 3 (three) times daily. 09/14/19  Yes Danford, Suann Larry, MD  oxymetazoline (AFRIN) 0.05 %  nasal spray Place 2 sprays  into both nostrils 2 (two) times daily as needed for congestion.   Yes [provider]  pantoprazole (PROTONIX) 40 MG tablet Take 40 mg by mouth daily.    Yes [provider]  pregabalin (LYRICA) 200 MG capsule Take 1 capsule (200 mg total) by mouth 3 (three) times daily. 09/14/19  Yes Danford, Suann Larry, MD  rivaroxaban (XARELTO) 20 MG TABS tablet Take 20 mg by mouth daily with supper.   Yes [provider]  senna-docusate (SENOKOT-S) 8.6-50 MG tablet Take 1 tablet by mouth daily.    Yes [provider]  traZODone (DESYREL) 100 MG tablet Take 100 mg by mouth at bedtime. 09/08/19  Yes [provider]  lisinopril (ZESTRIL) 20 MG tablet Take 1 tablet (20 mg total) by mouth daily. 09/16/19   Danford, Suann Larry, MD   Allergies  Allergen Reactions  . Acetaminophen Other (See Comments)    Reaction:  Unknown  Kidney failure Told not to take from home M.D. Related to kidney and renal failure   . Nsaids Other (See Comments)    Reaction:  Unknown  Kidney failure Patient states not to take from home M.D. Related to kidney and renal failure.  Other reaction(s): Unknown   Review of Systems  Unable to perform ROS   Physical Exam Constitutional:      Comments: On vent     Vital Signs: BP (!) 125/57   Pulse 90   Temp 99.3 F (37.4 C) (Oral)   Resp (!) 24   Ht 6' 2"  (1.88 m)   Wt 95.5 kg   SpO2 96%   BMI 27.03 kg/m  Pain Scale: Faces   Pain Score: 0-No pain   SpO2: SpO2: 96 % O2 Device:SpO2: 96 % O2 Flow Rate: .O2 Flow Rate (L/min): 15 L/min  IO: Intake/output summary:   Intake/Output Summary (Last 24 hours) at 09/22/2019 1551 Last data filed at 09/22/2019 1500 Gross per 24 hour  Intake 3042.73 ml  Output 3368 ml  Net -325.27 ml    LBM: Last BM Date:  (unknown ) Baseline Weight: Weight: 99 kg Most recent weight: Weight: 95.5 kg     Palliative Assessment/Data:     Time In: 3:05 Time Out:  4:15 Time Total: 70 min Greater than 50%  of this time was spent counseling and coordinating care related to the above assessment and plan.  Signed by: Asencion Gowda, NP   Please contact Palliative Medicine Team phone at [phone] for questions and concerns.  For individual provider: See [facility]

## 2019-09-22 NOTE — Consult Note (Signed)
[facility] CONSULT NOTE    Date: 09/22/2019                  Patient Name:  Gregory Hall  MRN: [id number]  DOB: [birth year]  Age / Sex: 64 y.o., male         PCP: Remi Haggard, FNP                 Service Requesting Consult:  Critical care                 Reason for Consult:  Acute kidney injury, hyperkalemia            History of Present Illness: Patient is a 64 y.o. male with a PMHx of coronary artery disease status post PTCA and stent x2, COPD, degenerative disc disease in the cervical spine, depression, diabetes mellitus type 2, GERD, hypertension, peripheral neuropathy, nutcracker esophagus, who was admitted to [facility] on 09/21/2019 for evaluation of unresponsiveness.  Patient unable to provide any history at this point in time.  He is currently maintained on the ventilator.  He was found unresponsive by caregiver at a group home.  We were asked to see him for acute kidney injury and severe hyperkalemia.  Patient underwent urgent dialysis.  Potassium down to 4.3.  Initial BUN and creatinine were 127 and 7.0.  BUN now down to 69 with creatinine of 3.1.  Good urine output noted overnight at 2.8 L.   Medications: Outpatient medications: Medications Prior to Admission  Medication Sig Dispense Refill Last Dose  . acetylcysteine (MUCOMYST) 20 % nebulizer solution Take 4 mLs by nebulization every 7 (seven) days.   Unknown at Unknown  . aspirin EC 81 MG tablet Take 81 mg by mouth daily.     Marland Kitchen atorvastatin (LIPITOR) 20 MG tablet Take 60 mg by mouth at bedtime.    09/16/2019 at 2000  . baclofen (LIORESAL) 20 MG tablet Take 20 mg by mouth every 8 (eight) hours.   09/21/2019 at 0800  . Cholecalciferol (VITAMIN D3) 50 MCG (2000 UT) TABS Take 2,000 Units by mouth daily.      . cyclobenzaprine (FLEXERIL) 5 MG tablet Take 1 tablet (5 mg total) by mouth 3 (three) times daily as needed. (Patient taking differently: Take 5 mg by mouth 2 (two) times daily as needed for  muscle spasms. ) 15 tablet 0 Unknown at PRN  . DULoxetine (CYMBALTA) 60 MG capsule Take 60 mg by mouth daily.   Unknown at Unknown  . Ensure Max Protein (ENSURE MAX PROTEIN) LIQD Take 330 mLs (11 oz total) by mouth 2 (two) times daily between meals. 3330 mL 0   . Fluticasone-Umeclidin-Vilant (TRELEGY ELLIPTA) 100-62.5-25 MCG/INH AEPB Inhale 1 puff into the lungs daily.   Unknown at Unknown  . furosemide (LASIX) 20 MG tablet Take 1 tablet (20 mg total) by mouth daily. (Patient taking differently: Take 20 mg by mouth daily as needed for fluid. ) 30 tablet  Unknown at PRN  . guaiFENesin (MUCINEX) 600 MG 12 hr tablet Take 600 mg by mouth 2 (two) times daily.   09/21/2019 at 0800  . insulin glargine (LANTUS) 100 UNIT/ML injection Inject 22 Units into the skin at bedtime.    09/16/2019 at 2000  . insulin lispro (HUMALOG) 100 UNIT/ML injection Inject 4 Units into the skin 3 (three) times daily before meals. Hold for FSBG <100   As directed at As directed  . ipratropium-albuterol (DUONEB) 0.5-2.5 (3) MG/3ML SOLN Take  3 mLs by nebulization every 6 (six) hours as needed. (Patient taking differently: Take 3 mLs by nebulization every 6 (six) hours as needed (wheezing and shortness of breath). ) 360 mL 0 Unknown at PRN  . lactulose (CHRONULAC) 10 GM/15ML solution Take 20 g by mouth daily as needed for mild constipation.   Unknown at PRN  . lamoTRIgine (LAMICTAL) 100 MG tablet Take 100 mg by mouth daily.   09/21/2019 at 0800  . Menthol-Methyl Salicylate (SALONPAS PAIN RELIEF PATCH EX) Place 1 patch onto the skin every 12 (twelve) hours as needed (aches and pains).   Unknown at PRN  . metFORMIN (GLUCOPHAGE-XR) 750 MG 24 hr tablet Take 1 tablet (750 mg total) by mouth 2 (two) times daily.   09/20/2019 at 2000  . metoprolol tartrate (LOPRESSOR) 25 MG tablet Take 12.5 mg by mouth 2 (two) times daily.   09/21/2019 at 0800  . NAC 600 MG CAPS Take 600 mg by mouth daily.     Marland Kitchen NITROSTAT 0.4 MG SL tablet DISSOLVE (1) TABLET UNDER  TONGUE AS NEEDED TO RELIEVE CHEST PAIN. MAYREPEAT EVERY 5 MINUTES. (Patient taking differently: Place 0.4 mg under the tongue every 5 (five) minutes as needed for chest pain. ) 25 tablet 1 Unknown at PRN  . ondansetron (ZOFRAN) 4 MG tablet Take 4 mg by mouth every 8 (eight) hours as needed for nausea or vomiting.    Unknown at PRN  . Oxycodone HCl 10 MG TABS Take 1 tablet (10 mg total) by mouth 3 (three) times daily. 9 tablet 0 09/21/2019 at 1400  . oxymetazoline (AFRIN) 0.05 % nasal spray Place 2 sprays into both nostrils 2 (two) times daily as needed for congestion.   Unknown at PRN  . pantoprazole (PROTONIX) 40 MG tablet Take 40 mg by mouth daily.    09/21/2019 at 0800  . pregabalin (LYRICA) 200 MG capsule Take 1 capsule (200 mg total) by mouth 3 (three) times daily. 9 capsule 0 09/21/2019 at 0800  . rivaroxaban (XARELTO) 20 MG TABS tablet Take 20 mg by mouth daily with supper.   09/17/2019 at 1700  . senna-docusate (SENOKOT-S) 8.6-50 MG tablet Take 1 tablet by mouth daily.      . traZODone (DESYREL) 100 MG tablet Take 100 mg by mouth at bedtime.   09/16/2019 at 2000  . lisinopril (ZESTRIL) 20 MG tablet Take 1 tablet (20 mg total) by mouth daily.       Current medications: Current Facility-Administered Medications  Medication Dose Route Frequency Provider Last Rate Last Admin  . 0.9 %  sodium chloride infusion  250 mL Intravenous Continuous Carrie Mew, MD   Stopped at 09/21/19 1624  . 0.9 %  sodium chloride infusion   Intravenous Continuous Tyler Pita, MD 75 mL/hr at 09/22/19 0700 Rate Verify at 09/22/19 0700  . 0.9 %  sodium chloride infusion  100 mL Intravenous PRN Shahidah Nesbitt, MD      . 0.9 %  sodium chloride infusion  100 mL Intravenous PRN Kenniyah Sasaki, MD      . alteplase (CATHFLO ACTIVASE) injection 2 mg  2 mg Intracatheter Once PRN Divinity Kyler, MD      . azithromycin (ZITHROMAX) 500 mg in sodium chloride 0.9 % 250 mL IVPB  500 mg Intravenous Q24H Carrie Mew,  MD   Stopped at 09/21/19 1644  . cefTRIAXone (ROCEPHIN) 2 g in sodium chloride 0.9 % 100 mL IVPB  2 g Intravenous Q24H Carrie Mew, MD   Stopped at 09/21/19  1554  . chlorhexidine gluconate (MEDLINE KIT) (PERIDEX) 0.12 % solution 15 mL  15 mL Mouth Rinse BID Tukov-Yual, Magdalene S, NP   15 mL at 09/21/19 2339  . Chlorhexidine Gluconate Cloth 2 % PADS 6 each  6 each Topical Q0600 Anthonette Legato, MD   6 each at 09/21/19 2230  . docusate sodium (COLACE) capsule 100 mg  100 mg Oral BID PRN Tyler Pita, MD      . fentaNYL 2524mg in NS 257m(1048mml) infusion-PREMIX  0-400 mcg/hr Intravenous Continuous StaCarrie MewD 10 mL/hr at 09/22/19 0700 100 mcg/hr at 09/22/19 0700  . heparin injection 1,000 Units  1,000 Units Dialysis PRN Bandy Honaker, MD      . heparin injection 5,000 Units  5,000 Units Subcutaneous Q8H GonTyler PitaD   5,000 Units at 09/22/19 [id number] insulin aspart (novoLOG) injection 1-3 Units  1-3 Units Subcutaneous Q4H GonTyler PitaD   1 Units at 09/22/19 [id number] ipratropium-albuterol (DUONEB) 0.5-2.5 (3) MG/3ML nebulizer solution 3 mL  3 mL Nebulization Q6H GonTyler PitaD   3 mL at 09/22/19 0257  . lidocaine (PF) (XYLOCAINE) 1 % injection 5 mL  5 mL Intradermal PRN Yacqub Baston, MD      . lidocaine-prilocaine (EMLA) cream 1 application  1 application Topical PRN Kalen Neidert, MD      . MEDLINE mouth rinse  15 mL Mouth Rinse 10 times per day Tukov-Yual, Magdalene S, NP   15 mL at 09/22/19 0521  . midazolam (VERSED) injection 2 mg  2 mg Intravenous Q2H PRN GonTyler PitaD      . norepinephrine (LEVOPHED) 16 mg in 250m74memix infusion  0-40 mcg/min Intravenous Titrated Tukov-Yual, Magdalene S, NP 29.1 mL/hr at 09/22/19 0700 31 mcg/min at 09/22/19 0700  . ondansetron (ZOFRAN) injection 4 mg  4 mg Intravenous Q6H PRN GonzTyler Pita      . pantoprazole (PROTONIX) injection 40 mg  40 mg Intravenous QHS GonzTyler Pita   40  mg at 09/21/19 2339  . pentafluoroprop-tetrafluoroeth (GEBAUERS) aerosol 1 application  1 application Topical PRN Shakthi Scipio, MD      . polyethylene glycol (MIRALAX / GLYCOLAX) packet 17 g  17 g Oral Daily PRN GonzTyler Pita      . propofol (DIPRIVAN) 1000 MG/100ML infusion  5-80 mcg/kg/min Intravenous Titrated GonzTyler Pita 11.88 mL/hr at 09/22/19 0700 20 mcg/kg/min at 09/22/19 0700      Allergies: Allergies  Allergen Reactions  . Acetaminophen Other (See Comments)    Reaction:  Unknown  Kidney failure Told not to take from home M.D. Related to kidney and renal failure   . Nsaids Other (See Comments)    Reaction:  Unknown  Kidney failure Patient states not to take from home M.D. Related to kidney and renal failure.  Other reaction(s): Unknown      Past Medical History: Past Medical History:  Diagnosis Date  . AKI (acute kidney injury) (HCC)[place]  . CAD (coronary artery disease)    s/p PTCA and stent x2  . Chest pain   . Chronic pain syndrome   . COPD (chronic obstructive pulmonary disease) (HCC)[place]. Degenerative cervical disc   . Depression   . Diabetes mellitus without complication (HCC)[place]. Dyslipidemia   . GERD (gastroesophageal reflux disease)   . Hernia 2014  . Hypertension   . MRSA (methicillin resistant staph aureus) culture positive 2011  .  Neuropathy   . Nutcracker esophagus   . Rectus diastasis 07/19/2012     Past Surgical History: Past Surgical History:  Procedure Laterality Date  . BACK SURGERY  2012  . CHOLECYSTECTOMY    . COLONOSCOPY  Jan 2014   [name]  . CORONARY ANGIOPLASTY WITH STENT PLACEMENT  2009   stents x2, in [place], [place]  . FOOT SURGERY     Right  . NECK SURGERY    . SPINE SURGERY  2012,2013  . TONSILLECTOMY       Family History: Family History  Family history unknown: Yes     Social History: Social History   Socioeconomic History  . Marital status: Single    Spouse name: Not on file  .  Number of children: Not on file  . Years of education: Not on file  . Highest education level: Not on file  Occupational History  . Not on file  Tobacco Use  . Smoking status: Current Every Day Smoker    Packs/day: 0.50    Years: 30.00    Pack years: 15.00  . Smokeless tobacco: Never Used  Substance and Sexual Activity  . Alcohol use: No  . Drug use: No  . Sexual activity: Not on file  Other Topics Concern  . Not on file  Social History Narrative  . Not on file   Social Determinants of Health   Financial Resource Strain:   . Difficulty of Paying Living Expenses: Not on file  Food Insecurity:   . Worried About Charity fundraiser in the Last Year: Not on file  . Ran Out of Food in the Last Year: Not on file  Transportation Needs:   . Lack of Transportation (Medical): Not on file  . Lack of Transportation (Non-Medical): Not on file  Physical Activity:   . Days of Exercise per Week: Not on file  . Minutes of Exercise per Session: Not on file  Stress:   . Feeling of Stress : Not on file  Social Connections:   . Frequency of Communication with Friends and Family: Not on file  . Frequency of Social Gatherings with Friends and Family: Not on file  . Attends Religious Services: Not on file  . Active Member of Clubs or Organizations: Not on file  . Attends Archivist Meetings: Not on file  . Marital Status: Not on file  Intimate Partner Violence:   . Fear of Current or Ex-Partner: Not on file  . Emotionally Abused: Not on file  . Physically Abused: Not on file  . Sexually Abused: Not on file     Review of Systems: Patient unable to offer history as he is currently intubated and sedated.  Vital Signs: Blood pressure (!) 125/47, pulse 69, temperature 98.9 F (37.2 C), temperature source Oral, resp. rate (!) 24, height 6' 2"  (1.88 m), weight 95.5 kg, SpO2 95 %.  Weight trends: Filed Weights   09/21/19 1530 09/21/19 2230 09/22/19 0500  Weight: 99 kg 95.8 kg  95.5 kg     Physical Exam: General:  Critically ill-appearing  Head:  Normocephalic, atraumatic.  Endotracheal tube in place  Eyes:  Anicteric  Neck:  Supple  Lungs:   Bilateral rhonchi, vent assisted  Heart:  Regular  Abdomen:   Soft, nontender, bowel sounds present  Extremities:  1+ peripheral edema.  Neurologic:  Intubated, sedated  Skin:  Bilateral upper extremity ecchymoses  GU:  Foley in place    Lab results: Basic Metabolic Panel: Recent  Labs  Lab 09/21/19 1526 09/22/19 0515  NA 129* 136  K 7.0* 4.3  CL 82* 95*  CO2 34* 31  GLUCOSE 142* 135*  BUN 127* 69*  CREATININE 7.07* 3.13*  CALCIUM 8.0* 7.5*  MG  --  1.9  PHOS 7.8* 3.8    Liver Function Tests: Recent Labs  Lab 09/21/19 1526  AST 24  ALT 18  ALKPHOS 79  BILITOT 0.9  PROT 6.1*  ALBUMIN 3.1*   No results for input(s): LIPASE, AMYLASE in the last 168 hours. No results for input(s): AMMONIA in the last 168 hours.  CBC: Recent Labs  Lab 09/21/19 1526 09/22/19 0515  WBC 11.7* 10.0  NEUTROABS 9.6*  --   HGB 11.5* 11.1*  HCT 35.5* 34.8*  MCV 86.0 87.2  PLT 328 353    Cardiac Enzymes: Recent Labs  Lab 09/21/19 1526  CKTOTAL 289    BNP: Invalid input(s): POCBNP  CBG: Recent Labs  Lab 09/21/19 1859 09/21/19 2345 09/22/19 0442 09/22/19 0725  GLUCAP 147* 152* 131* 114*    Microbiology: Results for orders placed or performed during the hospital encounter of 09/21/19  Blood Culture (routine x 2)     Status: None (Preliminary result)   Collection Time: 09/21/19  3:25 PM   Specimen: BLOOD  Result Value Ref Range Status   Specimen Description BLOOD LEFT ARM  Final   Special Requests   Final    BOTTLES DRAWN AEROBIC AND ANAEROBIC Blood Culture adequate volume   Culture   Final    NO GROWTH < 24 HOURS Performed at [facility], [address] [place], [place] [address]    Report Status PENDING  Incomplete  Blood Culture (routine x 2)     Status: None (Preliminary  result)   Collection Time: 09/21/19  3:25 PM   Specimen: BLOOD  Result Value Ref Range Status   Specimen Description BLOOD RIGHT ARM  Final   Special Requests   Final    BOTTLES DRAWN AEROBIC AND ANAEROBIC Blood Culture adequate volume   Culture   Final    NO GROWTH < 24 HOURS Performed at [facility], [address] [place], [place] [address]    Report Status PENDING  Incomplete  SARS Coronavirus 2 by RT PCR (hospital order, performed in [place] hospital lab) Nasopharyngeal Nasopharyngeal Swab     Status: None   Collection Time: 09/21/19  3:28 PM   Specimen: Nasopharyngeal Swab  Result Value Ref Range Status   SARS Coronavirus 2 NEGATIVE NEGATIVE Final    Comment: (NOTE) SARS-CoV-2 target nucleic acids are NOT DETECTED.  The SARS-CoV-2 RNA is generally detectable in upper and lower respiratory specimens during the acute phase of infection. The lowest concentration of SARS-CoV-2 viral copies this assay can detect is 250 copies / mL. A negative result does not preclude SARS-CoV-2 infection and should not be used as the sole basis for treatment or other patient management decisions.  A negative result may occur with improper specimen collection / handling, submission of specimen other than nasopharyngeal swab, presence of viral mutation(s) within the areas targeted by this assay, and inadequate number of viral copies (<250 copies / mL). A negative result must be combined with clinical observations, patient history, and epidemiological information.  Fact Sheet for Patients:   StrictlyIdeas.no  Fact Sheet for Healthcare Providers: BankingDealers.co.za  This test is not yet approved or  cleared by the [place] FDA and has been authorized for detection and/or diagnosis of SARS-CoV-2  by FDA under an Emergency Use Authorization (EUA).  This EUA will remain in effect (meaning this test can be used) for the duration of  the COVID-19 declaration under Section 564(b)(1) of the Act, 21 U.S.C. section 360bbb-3(b)(1), unless the authorization is terminated or revoked sooner.  Performed at [facility], [address] [place], [place] [address]   MRSA PCR Screening     Status: None   Collection Time: 09/21/19 10:16 PM   Specimen: Nasal Mucosa; Nasopharyngeal  Result Value Ref Range Status   MRSA by PCR NEGATIVE NEGATIVE Final    Comment:        The GeneXpert MRSA Assay (FDA approved for NASAL specimens only), is one component of a comprehensive MRSA colonization surveillance program. It is not intended to diagnose MRSA infection nor to guide or monitor treatment for MRSA infections. Performed at [facility], [address] [place], [place] [address]     Coagulation Studies: Recent Labs    09/21/19 1526  LABPROT 15.4*  INR 1.3*    Urinalysis: Recent Labs    09/21/19 1525  COLORURINE YELLOW*  LABSPEC 1.016  PHURINE 5.0  GLUCOSEU NEGATIVE  HGBUR NEGATIVE  BILIRUBINUR NEGATIVE  KETONESUR NEGATIVE  PROTEINUR NEGATIVE  NITRITE NEGATIVE  LEUKOCYTESUR NEGATIVE      Imaging: DG Chest Portable 1 View  Result Date: 09/21/2019 CLINICAL DATA:  Post intubation. EXAM: PORTABLE CHEST 1 VIEW COMPARISON:  09/21/2019 FINDINGS: There is a worsening airspace opacity at the right lung base. The endotracheal tube terminates above the carina by approximately 4.5 cm. The enteric tube appears to terminate at the level of the diaphragm. Coarse airspace opacities are noted at the lung bases. There is no pneumothorax or significant pleural effusion. A left subclavian artery stent is noted the lungs are hyperexpanded. Emphysematous changes are again noted. IMPRESSION: 1. Lines and tubes as above. The enteric tube is not appear to extend into the stomach. Repositioning is recommended. 2. New airspace opacity at the right lung base concerning for worsening atelectasis or a developing  infiltrate. There is persistent atelectasis at the left lung base. These results will be called to the ordering clinician or representative by the Radiologist Assistant, and communication documented in the PACS or [employer]. Electronically Signed   By: Constance Holster M.D.   On: 09/21/2019 18:30   DG Chest Port 1 View  Result Date: 09/21/2019 CLINICAL DATA:  64 year old with possible sepsis and acute mental status changes. Patient lethargic and unresponsive. Hypotension. Current smoker. Follow-up pneumonia. EXAM: PORTABLE CHEST 1 VIEW COMPARISON:  09/12/2019 and earlier, including CTA chest 06/12/2019. FINDINGS: Cardiac silhouette normal in size for AP portable technique, unchanged. Thoracic aorta atherosclerotic, unchanged. Prominent central pulmonary arteries, unchanged. Since the most recent prior examination 9 days ago, improved aeration in the lung bases with near complete resolution of the airspace opacities present at that time. Residual mild linear atelectasis at the RIGHT lung base. No new pulmonary parenchymal abnormalities. Emphysematous changes as noted on the prior CT. Dorsal cord stimulating device overlies the mid thoracic spine, unchanged. IMPRESSION: Near complete resolution of the bibasilar pneumonia since the most recent prior examination 9 days ago. Residual mild linear atelectasis at the RIGHT lung base. Electronically Signed   By: Evangeline Dakin M.D.   On: 09/21/2019 16:05      Assessment & Plan: Pt is a 64 y.o. male with a PMHx of coronary artery disease status post PTCA and stent x2, COPD, degenerative disc disease in the cervical spine,  depression, diabetes mellitus type 2, GERD, hypertension, peripheral neuropathy, nutcracker esophagus, who was admitted to [facility] on 09/21/2019 for evaluation of unresponsiveness.  1.  Acute kidney injury.  Baseline creatinine 1.0 with an EGFR greater than 60.  Suspect acute kidney injury related to hypotension.  Patient did undergo 1  dialysis treatment yesterday.  Tolerated well.  BUN down to 69 with creatinine of 3.1.  Potassium also significantly improved.  Therefore no immediate need for additional dialysis at the moment.  Continue supportive care.  Check renal ultrasound, SPEP, UPEP, ANA, ANCA reflex, GBM antibodies, C3, and C4.  2.  Hyperkalemia.  Presenting potassium was 7.0.  Now down to 4.3 with dialysis intervention.  Continue to monitor.  3.  Acute respiratory failure.  Weaning from the ventilator as per pulmonary/critical care.  4.  Thanks for consultation.

## 2019-09-23 DIAGNOSIS — N171 Acute kidney failure with acute cortical necrosis: Secondary | ICD-10-CM

## 2019-09-23 LAB — BASIC METABOLIC PANEL
Anion gap: 9 (ref 5–15)
BUN: 37 mg/dL — ABNORMAL HIGH (ref 8–23)
CO2: 30 mmol/L (ref 22–32)
Calcium: 8 mg/dL — ABNORMAL LOW (ref 8.9–10.3)
Chloride: 101 mmol/L (ref 98–111)
Creatinine, Ser: 1.52 mg/dL — ABNORMAL HIGH (ref 0.61–1.24)
GFR calc Af Amer: 55 mL/min — ABNORMAL LOW (ref >60)
GFR calc non Af Amer: 47 mL/min — ABNORMAL LOW (ref >60)
Glucose, Bld: 228 mg/dL — ABNORMAL HIGH (ref 70–99)
Potassium: 5.5 mmol/L — ABNORMAL HIGH (ref 3.5–5.1)
Sodium: 140 mmol/L (ref 135–145)

## 2019-09-23 LAB — COMPREHENSIVE METABOLIC PANEL
ALT: 22 U/L (ref 0–44)
AST: 26 U/L (ref 15–41)
Albumin: 2.6 g/dL — ABNORMAL LOW (ref 3.5–5.0)
Alkaline Phosphatase: 64 U/L (ref 38–126)
Anion gap: 12 (ref 5–15)
BUN: 26 mg/dL — ABNORMAL HIGH (ref 8–23)
CO2: 30 mmol/L (ref 22–32)
Calcium: 8.1 mg/dL — ABNORMAL LOW (ref 8.9–10.3)
Chloride: 102 mmol/L (ref 98–111)
Creatinine, Ser: 1.12 mg/dL (ref 0.61–1.24)
GFR calc Af Amer: >60 mL/min (ref >60)
GFR calc non Af Amer: >60 mL/min (ref >60)
Glucose, Bld: 141 mg/dL — ABNORMAL HIGH (ref 70–99)
Potassium: 4.4 mmol/L (ref 3.5–5.1)
Sodium: 144 mmol/L (ref 135–145)
Total Bilirubin: 0.6 mg/dL (ref 0.3–1.2)
Total Protein: 5.2 g/dL — ABNORMAL LOW (ref 6.5–8.1)

## 2019-09-23 LAB — PROTEIN ELECTROPHORESIS, SERUM
A/G Ratio: 1.2 (ref 0.7–1.7)
Albumin ELP: 2.7 g/dL — ABNORMAL LOW (ref 2.9–4.4)
Alpha-1-Globulin: 0.3 g/dL (ref 0.0–0.4)
Alpha-2-Globulin: 0.8 g/dL (ref 0.4–1.0)
Beta Globulin: 0.8 g/dL (ref 0.7–1.3)
Gamma Globulin: 0.4 g/dL (ref 0.4–1.8)
Globulin, Total: 2.3 g/dL (ref 2.2–3.9)
Total Protein ELP: 5 g/dL — ABNORMAL LOW (ref 6.0–8.5)

## 2019-09-23 LAB — MPO/PR-3 (ANCA) ANTIBODIES
ANCA Proteinase 3: <3.5 U/mL (ref 0.0–3.5)
Myeloperoxidase Abs: <9 U/mL (ref 0.0–9.0)

## 2019-09-23 LAB — PARATHYROID HORMONE, INTACT (NO CA): PTH: 122 pg/mL — ABNORMAL HIGH (ref 15–65)

## 2019-09-23 LAB — PROTEIN ELECTRO, RANDOM URINE
Albumin ELP, Urine: 16.6 %
Alpha-1-Globulin, U: 6.7 %
Alpha-2-Globulin, U: 15.2 %
Beta Globulin, U: 37.6 %
Gamma Globulin, U: 23.9 %
Total Protein, Urine: 16.7 mg/dL

## 2019-09-23 LAB — ANA W/REFLEX IF POSITIVE: Anti Nuclear Antibody (ANA): NEGATIVE

## 2019-09-23 LAB — LEGIONELLA PNEUMOPHILA SEROGP 1 UR AG: L. pneumophila Serogp 1 Ur Ag: NEGATIVE

## 2019-09-23 LAB — C4 COMPLEMENT: Complement C4, Body Fluid: 22 mg/dL (ref 12–38)

## 2019-09-23 LAB — C3 COMPLEMENT: C3 Complement: 115 mg/dL (ref 82–167)

## 2019-09-23 LAB — PHOSPHORUS
Phosphorus: 3.3 mg/dL (ref 2.5–4.6)
Phosphorus: 2.5 mg/dL (ref 2.5–4.6)

## 2019-09-23 LAB — GLUCOSE, CAPILLARY
Glucose-Capillary: 213 mg/dL — ABNORMAL HIGH (ref 70–99)
Glucose-Capillary: 237 mg/dL — ABNORMAL HIGH (ref 70–99)
Glucose-Capillary: 200 mg/dL — ABNORMAL HIGH (ref 70–99)
Glucose-Capillary: 138 mg/dL — ABNORMAL HIGH (ref 70–99)
Glucose-Capillary: 150 mg/dL — ABNORMAL HIGH (ref 70–99)

## 2019-09-23 LAB — MAGNESIUM
Magnesium: 1.7 mg/dL (ref 1.7–2.4)
Magnesium: 1.6 mg/dL — ABNORMAL LOW (ref 1.7–2.4)

## 2019-09-23 LAB — PROCALCITONIN: Procalcitonin: 0.39 ng/mL

## 2019-09-23 MED ORDER — AMIODARONE LOAD VIA INFUSION
150.0000 mg | Freq: Once | INTRAVENOUS | Status: AC
  Administered 2019-09-23: 150 mg via INTRAVENOUS
  Filled 2019-09-23: qty 83.34

## 2019-09-23 MED ORDER — AMIODARONE HCL IN DEXTROSE 360-4.14 MG/200ML-% IV SOLN
60.0000 mg/h | INTRAVENOUS | Status: AC
  Administered 2019-09-23 (×2): 60 mg/h via INTRAVENOUS
  Filled 2019-09-23 (×2): qty 200

## 2019-09-23 MED ORDER — AMIODARONE IV BOLUS ONLY 150 MG/100ML
INTRAVENOUS | Status: AC
  Administered 2019-09-23: 150 mg via INTRAVENOUS
  Filled 2019-09-23: qty 100

## 2019-09-23 MED ORDER — MORPHINE SULFATE (PF) 2 MG/ML IV SOLN
INTRAVENOUS | Status: AC
Start: 2019-09-23 — End: 2019-09-24
  Filled 2019-09-23: qty 1

## 2019-09-23 MED ORDER — MELATONIN 5 MG PO TABS
5.0000 mg | ORAL_TABLET | Freq: Every evening | ORAL | Status: DC | PRN
  Administered 2019-09-24: 5 mg via ORAL
  Filled 2019-09-23: qty 1

## 2019-09-23 MED ORDER — AMIODARONE HCL IN DEXTROSE 360-4.14 MG/200ML-% IV SOLN
30.0000 mg/h | INTRAVENOUS | Status: DC
  Administered 2019-09-24: 30 mg/h via INTRAVENOUS
  Filled 2019-09-23: qty 200

## 2019-09-23 MED ORDER — POLYETHYLENE GLYCOL 3350 17 G PO PACK: 17.0000 g | PACK | Freq: Every day | ORAL | Status: DC | PRN

## 2019-09-23 MED ORDER — MAGNESIUM SULFATE 2 GM/50ML IV SOLN
2.0000 g | Freq: Once | INTRAVENOUS | Status: AC
  Administered 2019-09-23: 2 g via INTRAVENOUS
  Filled 2019-09-23: qty 50

## 2019-09-23 MED ORDER — DOCUSATE SODIUM 50 MG/5ML PO LIQD
100.0000 mg | Freq: Two times a day (BID) | ORAL | Status: DC | PRN
  Filled 2019-09-23: qty 10

## 2019-09-23 MED ORDER — AMIODARONE IV BOLUS ONLY 150 MG/100ML: 150.0000 mg | Freq: Once | INTRAVENOUS | Status: AC

## 2019-09-23 MED ORDER — PATIROMER SORBITEX CALCIUM 8.4 G PO PACK
8.4000 g | PACK | Freq: Every day | ORAL | Status: DC
  Administered 2019-09-23: 8.4 g via ORAL
  Filled 2019-09-23 (×2): qty 1

## 2019-09-23 MED ORDER — MORPHINE SULFATE (PF) 2 MG/ML IV SOLN
2.0000 mg | INTRAVENOUS | Status: DC | PRN
  Administered 2019-09-23 – 2019-09-27 (×20): 2 mg via INTRAVENOUS
  Filled 2019-09-23 (×20): qty 1

## 2019-09-23 MED FILL — Sodium Chloride IV Soln 0.9%: INTRAVENOUS | Qty: 250 | Status: AC

## 2019-09-23 MED FILL — Norepinephrine Bitartrate IV Soln 1 MG/ML (Base Equivalent): INTRAVENOUS | Qty: 4 | Status: AC

## 2019-09-23 NOTE — Progress Notes (Signed)
[facility]  ROUNDING NOTE   Subjective:  Patient remains critically ill. Still on the ventilator. Potassium currently 5.5. BUN 37 creatinine 1.5.   Objective:  Vital signs in last 24 hours:  Temp:  [98.6 F (37 C)-100.1 F (37.8 C)] 98.6 F (37 C) (09/14 0800) Pulse Rate:  [66-92] 66 (09/14 0800) Resp:  [21-24] 24 (09/14 0800) BP: (102-152)/(47-66) 140/57 (09/14 0800) SpO2:  [93 %-99 %] 97 % (09/14 0800) FiO2 (%):  [40 %-50 %] 50 % (09/14 0600) Weight:  [95.2 kg] 95.2 kg (09/14 0330)  Weight change: -3.8 kg Filed Weights   09/21/19 2230 09/22/19 0500 09/23/19 0330  Weight: 95.8 kg 95.5 kg 95.2 kg    Intake/Output: I/O last 3 completed shifts: In: 5287.2 [I.V.:4640.4; IV Piggyback:646.9] Out: 6979 [Urine:4600; Emesis/NG output:100]   Intake/Output this shift:  No intake/output data recorded.  Physical Exam: General:  Critically ill-appearing  Head:  Normocephalic, endotracheal tube in place  Eyes:  Anicteric  Neck:  Supple  Lungs:   Bilateral rhonchi, vent assisted  Heart:  S1S2 no rubs  Abdomen:   Soft, nontender, bowel sounds present  Extremities:  trace peripheral edema.  Neurologic:  Intubated, sedated  Skin:  No lesions  GU:  Foley catheter in place    Basic Metabolic Panel: Recent Labs  Lab 09/21/19 1526 09/22/19 0515 09/23/19 0330  NA 129* 136 140  K 7.0* 4.3 5.5*  CL 82* 95* 101  CO2 34* 31 30  GLUCOSE 142* 135* 228*  BUN 127* 69* 37*  CREATININE 7.07* 3.13* 1.52*  CALCIUM 8.0* 7.5* 8.0*  MG  --  1.9 1.7  PHOS 7.8* 3.8 3.3    Liver Function Tests: Recent Labs  Lab 09/21/19 1526  AST 24  ALT 18  ALKPHOS 79  BILITOT 0.9  PROT 6.1*  ALBUMIN 3.1*   No results for input(s): LIPASE, AMYLASE in the last 168 hours. No results for input(s): AMMONIA in the last 168 hours.  CBC: Recent Labs  Lab 09/21/19 1526 09/22/19 0515  WBC 11.7* 10.0  NEUTROABS 9.6*  --   HGB 11.5* 11.1*  HCT 35.5* 34.8*  MCV 86.0 87.2  PLT  328 353    Cardiac Enzymes: Recent Labs  Lab 09/21/19 1526  CKTOTAL 289    BNP: Invalid input(s): POCBNP  CBG: Recent Labs  Lab 09/22/19 1617 09/22/19 1956 09/22/19 2341 09/23/19 0345 09/23/19 0737  GLUCAP 174* 172* 206* 213* 41*    Microbiology: Results for orders placed or performed during the hospital encounter of 09/21/19  Blood Culture (routine x 2)     Status: None (Preliminary result)   Collection Time: 09/21/19  3:25 PM   Specimen: BLOOD  Result Value Ref Range Status   Specimen Description BLOOD LEFT ARM  Final   Special Requests   Final    BOTTLES DRAWN AEROBIC AND ANAEROBIC Blood Culture adequate volume   Culture   Final    NO GROWTH 2 DAYS Performed at [facility], [address] [place], [place] [address]    Report Status PENDING  Incomplete  Blood Culture (routine x 2)     Status: Abnormal (Preliminary result)   Collection Time: 09/21/19  3:25 PM   Specimen: BLOOD  Result Value Ref Range Status   Specimen Description   Final    BLOOD RIGHT ARM Performed at [facility], [address] [place], [place] [address]    Special Requests   Final    BOTTLES DRAWN AEROBIC AND  ANAEROBIC Blood Culture adequate volume Performed at [facility], [address] [place], [place] [address]    Culture  Setup Time   Final    Organism ID to follow GRAM POSITIVE COCCI AEROBIC BOTTLE ONLY CRITICAL RESULT CALLED TO, READ BACK BY AND VERIFIED WITH: ABBY ELLINGTON AT 1029 09/22/19 SDR GRAM STAIN REVIEWED-AGREE WITH RESULT Performed at [facility], [place] [address], [place], [place] [address]    Culture STAPHYLOCOCCUS EPIDERMIDIS (A)  Final   Report Status PENDING  Incomplete  Urine culture     Status: None   Collection Time: 09/21/19  3:25 PM   Specimen: In/Out Cath Urine  Result Value Ref Range Status   Specimen Description   Final    IN/OUT CATH URINE Performed at [facility], [address],  [place], [place] [address]    Special Requests   Final    NONE Performed at [facility], [address], [place], [place] [address]    Culture   Final    NO GROWTH Performed at [facility], [place] [address], [place], [place] [address]    Report Status 09/22/2019 FINAL  Final  Blood Culture ID Panel (Reflexed)     Status: Abnormal   Collection Time: 09/21/19  3:25 PM  Result Value Ref Range Status   Enterococcus faecalis NOT DETECTED NOT DETECTED Final   Enterococcus Faecium NOT DETECTED NOT DETECTED Final   Listeria monocytogenes NOT DETECTED NOT DETECTED Final   Staphylococcus species DETECTED (A) NOT DETECTED Final    Comment: CRITICAL RESULT CALLED TO, READ BACK BY AND VERIFIED WITH:  ABBY ELLINGTON AT 1029 09/22/19 SDR    Staphylococcus aureus (BCID) NOT DETECTED NOT DETECTED Final   Staphylococcus epidermidis DETECTED (A) NOT DETECTED Final    Comment: Methicillin (oxacillin) resistant coagulase negative staphylococcus. Possible blood culture contaminant (unless isolated from more than one blood culture draw or clinical case suggests pathogenicity). No antibiotic treatment is indicated for blood  culture contaminants. CRITICAL RESULT CALLED TO, READ BACK BY AND VERIFIED WITH: ABBY ELLINGTON AT 3846 09/22/19 SDR    Staphylococcus lugdunensis NOT DETECTED NOT DETECTED Final   Streptococcus species NOT DETECTED NOT DETECTED Final   Streptococcus agalactiae NOT DETECTED NOT DETECTED Final   Streptococcus pneumoniae NOT DETECTED NOT DETECTED Final   Streptococcus pyogenes NOT DETECTED NOT DETECTED Final   A.calcoaceticus-baumannii NOT DETECTED NOT DETECTED Final   Bacteroides fragilis NOT DETECTED NOT DETECTED Final   Enterobacterales NOT DETECTED NOT DETECTED Final   Enterobacter cloacae complex NOT DETECTED NOT DETECTED Final   Escherichia coli NOT DETECTED NOT DETECTED Final   Klebsiella aerogenes NOT DETECTED NOT DETECTED Final   Klebsiella oxytoca NOT DETECTED NOT  DETECTED Final   Klebsiella pneumoniae NOT DETECTED NOT DETECTED Final   Proteus species NOT DETECTED NOT DETECTED Final   Salmonella species NOT DETECTED NOT DETECTED Final   Serratia marcescens NOT DETECTED NOT DETECTED Final   Haemophilus influenzae NOT DETECTED NOT DETECTED Final   Neisseria meningitidis NOT DETECTED NOT DETECTED Final   Pseudomonas aeruginosa NOT DETECTED NOT DETECTED Final   Stenotrophomonas maltophilia NOT DETECTED NOT DETECTED Final   Candida albicans NOT DETECTED NOT DETECTED Final   Candida auris NOT DETECTED NOT DETECTED Final   Candida glabrata NOT DETECTED NOT DETECTED Final   Candida krusei NOT DETECTED NOT DETECTED Final   Candida parapsilosis NOT DETECTED NOT DETECTED Final   Candida tropicalis NOT DETECTED NOT DETECTED Final   Cryptococcus neoformans/gattii NOT  DETECTED NOT DETECTED Final   Methicillin resistance mecA/C DETECTED (A) NOT DETECTED Final    Comment: CRITICAL RESULT CALLED TO, READ BACK BY AND VERIFIED WITH:  ABBY ELLINGTON AT 1610 09/22/19 SDR Performed at [facility], [address] [place], [place] [address]   SARS Coronavirus 2 by RT PCR (hospital order, performed in [facility] hospital lab) Nasopharyngeal Nasopharyngeal Swab     Status: None   Collection Time: 09/21/19  3:28 PM   Specimen: Nasopharyngeal Swab  Result Value Ref Range Status   SARS Coronavirus 2 NEGATIVE NEGATIVE Final    Comment: (NOTE) SARS-CoV-2 target nucleic acids are NOT DETECTED.  The SARS-CoV-2 RNA is generally detectable in upper and lower respiratory specimens during the acute phase of infection. The lowest concentration of SARS-CoV-2 viral copies this assay can detect is 250 copies / mL. A negative result does not preclude SARS-CoV-2 infection and should not be used as the sole basis for treatment or other patient management decisions.  A negative result may occur with improper specimen collection / handling, submission of specimen other than  nasopharyngeal swab, presence of viral mutation(s) within the areas targeted by this assay, and inadequate number of viral copies (<250 copies / mL). A negative result must be combined with clinical observations, patient history, and epidemiological information.  Fact Sheet for Patients:   StrictlyIdeas.no  Fact Sheet for Healthcare Providers: BankingDealers.co.za  This test is not yet approved or  cleared by the [place] FDA and has been authorized for detection and/or diagnosis of SARS-CoV-2 by FDA under an Emergency Use Authorization (EUA).  This EUA will remain in effect (meaning this test can be used) for the duration of the COVID-19 declaration under Section 564(b)(1) of the Act, 21 U.S.C. section 360bbb-3(b)(1), unless the authorization is terminated or revoked sooner.  Performed at [facility], [address] [place], [place] [address]   MRSA PCR Screening     Status: None   Collection Time: 09/21/19 10:16 PM   Specimen: Nasal Mucosa; Nasopharyngeal  Result Value Ref Range Status   MRSA by PCR NEGATIVE NEGATIVE Final    Comment:        The GeneXpert MRSA Assay (FDA approved for NASAL specimens only), is one component of a comprehensive MRSA colonization surveillance program. It is not intended to diagnose MRSA infection nor to guide or monitor treatment for MRSA infections. Performed at [facility], [address] [place], [place] [address]     Coagulation Studies: Recent Labs    09/21/19 1526  LABPROT 15.4*  INR 1.3*    Urinalysis: Recent Labs    09/21/19 1525  COLORURINE YELLOW*  LABSPEC 1.016  PHURINE 5.0  GLUCOSEU NEGATIVE  HGBUR NEGATIVE  BILIRUBINUR NEGATIVE  KETONESUR NEGATIVE  PROTEINUR NEGATIVE  NITRITE NEGATIVE  LEUKOCYTESUR NEGATIVE      Imaging: US RENAL  Result Date: 09/22/2019 CLINICAL DATA:  Acute renal failure EXAM: RENAL / URINARY TRACT ULTRASOUND  COMPLETE COMPARISON:  None. FINDINGS: Right Kidney: Renal measurements: 10.9 x 5.7 x 5.0 cm = volume: 163 mL. Echogenic renal parenchyma. No mass or hydronephrosis visualized. Left Kidney: Renal measurements: 11.0 x 5.3 x 6.0 cm = volume: 180 mL. Echogenic renal parenchyma. No mass or hydronephrosis visualized. Bladder: Decompressed by indwelling Foley catheter Other: None. IMPRESSION: Echogenic renal parenchyma, suggesting medical renal disease. No hydronephrosis. Bladder decompressed by indwelling Foley catheter. Electronically Signed   By: Julian Hy M.D.   On: 09/22/2019 09:36   DG Chest Port 1  View  Result Date: 09/22/2019 CLINICAL DATA:  Hypoxia EXAM: PORTABLE CHEST 1 VIEW COMPARISON:  September 21, 2019 FINDINGS: Endotracheal tube tip is 8.4 cm above the carina. Nasogastric tube tip and side port are below the diaphragm. Stimulator leads are overlying the midthoracic region. No pneumothorax. There is atelectatic change in the lung bases with partial clearing of airspace opacity from the bases. No new opacity evident. Heart is upper normal in size with pulmonary vascularity normal. No adenopathy. There is a stent in the left subclavian artery region. There is aortic atherosclerosis. No bone lesions. IMPRESSION: Tube and catheter positions as described without pneumothorax. Bibasilar atelectasis with partial clearing of airspace opacity from the bases. No new opacity. Stable cardiac silhouette. Aortic Atherosclerosis (ICD10-I70.0). Electronically Signed   By: Lowella Grip III M.D.   On: 09/22/2019 08:05   DG Chest Portable 1 View  Result Date: 09/21/2019 CLINICAL DATA:  Post intubation. EXAM: PORTABLE CHEST 1 VIEW COMPARISON:  09/21/2019 FINDINGS: There is a worsening airspace opacity at the right lung base. The endotracheal tube terminates above the carina by approximately 4.5 cm. The enteric tube appears to terminate at the level of the diaphragm. Coarse airspace opacities are noted at the  lung bases. There is no pneumothorax or significant pleural effusion. A left subclavian artery stent is noted the lungs are hyperexpanded. Emphysematous changes are again noted. IMPRESSION: 1. Lines and tubes as above. The enteric tube is not appear to extend into the stomach. Repositioning is recommended. 2. New airspace opacity at the right lung base concerning for worsening atelectasis or a developing infiltrate. There is persistent atelectasis at the left lung base. These results will be called to the ordering clinician or representative by the Radiologist Assistant, and communication documented in the PACS or [employer]. Electronically Signed   By: Constance Holster M.D.   On: 09/21/2019 18:30   DG Chest Port 1 View  Result Date: 09/21/2019 CLINICAL DATA:  64 year old with possible sepsis and acute mental status changes. Patient lethargic and unresponsive. Hypotension. Current smoker. Follow-up pneumonia. EXAM: PORTABLE CHEST 1 VIEW COMPARISON:  09/12/2019 and earlier, including CTA chest 06/12/2019. FINDINGS: Cardiac silhouette normal in size for AP portable technique, unchanged. Thoracic aorta atherosclerotic, unchanged. Prominent central pulmonary arteries, unchanged. Since the most recent prior examination 9 days ago, improved aeration in the lung bases with near complete resolution of the airspace opacities present at that time. Residual mild linear atelectasis at the RIGHT lung base. No new pulmonary parenchymal abnormalities. Emphysematous changes as noted on the prior CT. Dorsal cord stimulating device overlies the mid thoracic spine, unchanged. IMPRESSION: Near complete resolution of the bibasilar pneumonia since the most recent prior examination 9 days ago. Residual mild linear atelectasis at the RIGHT lung base. Electronically Signed   By: Evangeline Dakin M.D.   On: 09/21/2019 16:05     Medications:   . sodium chloride Stopped (09/21/19 1624)  . sodium chloride 75 mL/hr at 09/23/19  0700  . sodium chloride    . sodium chloride    . azithromycin Stopped (09/22/19 1840)  . cefTRIAXone (ROCEPHIN)  IV Stopped (09/22/19 1619)  . fentaNYL infusion INTRAVENOUS 250 mcg/hr (09/23/19 0700)  . norepinephrine (LEVOPHED) Adult infusion 11 mcg/min (09/23/19 0700)  . propofol (DIPRIVAN) infusion 60 mcg/kg/min (09/23/19 0700)  . vasopressin 0.03 Units/min (09/23/19 0700)   . chlorhexidine gluconate (MEDLINE KIT)  15 mL Mouth Rinse BID  . Chlorhexidine Gluconate Cloth  6 each Topical Q0600  . heparin  5,000 Units Subcutaneous  Q8H  . hydrocortisone sod succinate (SOLU-CORTEF) inj  50 mg Intravenous Q6H  . insulin aspart  1-3 Units Subcutaneous Q4H  . ipratropium-albuterol  3 mL Nebulization Q6H  . mouth rinse  15 mL Mouth Rinse 10 times per day  . pantoprazole (PROTONIX) IV  40 mg Intravenous QHS   sodium chloride, sodium chloride, alteplase, docusate sodium, heparin, lidocaine (PF), lidocaine-prilocaine, midazolam, ondansetron (ZOFRAN) IV, pentafluoroprop-tetrafluoroeth, polyethylene glycol  Assessment/ Plan:  64 y.o. male with a PMHx of coronary artery disease status post PTCA and stent x2, COPD, degenerative disc disease in the cervical spine, depression, diabetes mellitus type 2, GERD, hypertension, peripheral neuropathy, nutcracker esophagus, who was admitted to [facility] on 09/21/2019 for evaluation of unresponsiveness.  1.  Acute kidney injury.  Baseline creatinine 1.0 with an EGFR greater than 60.  Suspect acute kidney injury related to hypotension.  Patient has undergone 1 dialysis treatment.  Good urine output at 2.7 L noted over the preceding 24 hours.  Therefore no immediate need for additional dialysis.  2.  Hyperkalemia.  Serum potassium 5.5.  Start the patient on [place] 8.4 g p.o. daily.  3.  Acute respiratory failure.  Maintain on the ventilator.  Weaning from the ventilator as per respiratory therapy and pulmonary/critical care.   LOS: 2 Donevin Sainsbury 9/14/20218:22  AM

## 2019-09-23 NOTE — Progress Notes (Signed)
GOALS OF CARE DISCUSSION Sister at bedside Terri  The Clinical status was relayed to family in detail.  Updated and notified of patients medical condition.  Explained to family course of therapy and the modalities     Probable  very low chance of meaningful recovery despite all aggressive and optimal medical therapy. Patient is in the Dying  Process associated with Suffering.  Family understands the situation.  They have consented and agreed to DNR/DNI and would like to proceed with Comfort care measures if needed  Family are satisfied with Plan of action and management. All questions answered  Additional CC time 32 mins   Azarius Lambson Santiago Glad, M.D.  [facility]  Medical Director [facility] Medical Director [facility] Cardio-Pulmonary Department

## 2019-09-23 NOTE — Progress Notes (Signed)
Patient extubated and placed on 4lpm [place].  Patient tolerating well at this time. Will continue to monitor.

## 2019-09-23 NOTE — Progress Notes (Signed)
PHARMACY CONSULT NOTE - FOLLOW UP  Pharmacy Consult for Electrolyte Monitoring and Replacement   Recent Labs: Potassium (mmol/L)  Date Value  09/23/2019 5.5 (H)  03/31/2013 3.6   Magnesium (mg/dL)  Date Value  16/10/9602 1.7  06/25/2012 2.4   Calcium (mg/dL)  Date Value  54/09/8117 8.0 (L)   Calcium, Total (mg/dL)  Date Value  14/78/2956 7.6 (L)   Albumin (g/dL)  Date Value  21/30/8657 3.1 (L)  12/07/2014 3.6  03/30/2013 3.2 (L)   Phosphorus (mg/dL)  Date Value  84/69/6295 3.3  03/20/2012 3.4   Sodium (mmol/L)  Date Value  09/23/2019 140  03/31/2013 142  Corrected Calcium: 8.0   Assessment: Patient is a 64yo male admitted with severe AKI, hyperkalemia (K = 7.0) requiring emergent HD. Pharmacy consulted to assist with management of electrolytes.  9/12 K 7.0, emergent HD completed 9/13 K 4.3. Cr improved from 7.07 >> 3.13. UOP of 2.8 L yesterday. Plan for dialysis as needed.  9/14 K 5.5, other electrolytes wnl. Cr improving 3.13 >> 1.52. Good UOP. No plan for dialysis today.  Plan:  - Start patiromer [facility]) 8.4 g packet daily - No additional replacement needed at this time. - Recheck electrolytes with AM labs.  Levada Schilling PharmD Candidate 2022 09/23/2019 12:51 PM

## 2019-09-23 NOTE — Progress Notes (Signed)
Patient in Afib w/RVR into the 170s. Normotensive not on vasopressors  --Will give Amio 150mg  bolus x1 --Then start Amio gtt    ACNP-BC

## 2019-09-23 NOTE — Progress Notes (Signed)
CRITICAL CARE NOTE  CC  follow up respiratory failure  SUBJECTIVE Patient remains critically ill Prognosis is guarded Sister at bedside updated Multiorgan failure in setting of end stage COPD and active smoker   BP (!) 117/52   Pulse 68   Temp 98.8 F (37.1 C)   Resp (!) 24   Ht 6' 2.02" (1.88 m)   Wt 95.2 kg   SpO2 96%   BMI 26.94 kg/m    I/O last 3 completed shifts: In: 5287.2 [I.V.:4640.4; IV Piggyback:646.9] Out: 4218 [Urine:4600; Emesis/NG output:100] Total I/O In: 213.9 [I.V.:213.9] Out: 175 [Urine:175]  SpO2: 96 % O2 Flow Rate (L/min): 15 L/min FiO2 (%): 45 %  Estimated body mass index is 26.94 kg/m as calculated from the following:   Height as of this encounter: 6' 2.02" (1.88 m).   Weight as of this encounter: 95.2 kg.  SIGNIFICANT EVENTS   REVIEW OF SYSTEMS  PATIENT IS UNABLE TO PROVIDE COMPLETE REVIEW OF SYSTEMS DUE TO SEVERE CRITICAL ILLNESS        PHYSICAL EXAMINATION:  GENERAL:critically ill appearing, +resp distress HEAD: Normocephalic, atraumatic.  EYES: Pupils equal, round, reactive to light.  No scleral icterus.  MOUTH: Moist mucosal membrane. NECK: Supple.  PULMONARY: +rhonchi, +wheezing CARDIOVASCULAR: S1 and S2. Regular rate and rhythm. No murmurs, rubs, or gallops.  GASTROINTESTINAL: Soft, nontender, -distended.  Positive bowel sounds.   MUSCULOSKELETAL: No swelling, clubbing, or edema.  NEUROLOGIC: obtunded, GCS<8 SKIN:intact,warm,dry  MEDICATIONS: I have reviewed all medications and confirmed regimen as documented   CULTURE RESULTS   Recent Results (from the past 240 hour(s))  Blood Culture (routine x 2)     Status: None (Preliminary result)   Collection Time: 09/21/19  3:25 PM   Specimen: BLOOD  Result Value Ref Range Status   Specimen Description BLOOD LEFT ARM  Final   Special Requests   Final    BOTTLES DRAWN AEROBIC AND ANAEROBIC Blood Culture adequate volume   Culture   Final    NO GROWTH 2 DAYS Performed at  [facility], [address] [place], [place] [address]    Report Status PENDING  Incomplete  Blood Culture (routine x 2)     Status: Abnormal (Preliminary result)   Collection Time: 09/21/19  3:25 PM   Specimen: BLOOD  Result Value Ref Range Status   Specimen Description   Final    BLOOD RIGHT ARM Performed at [facility], [address] [place], [place] [address]    Special Requests   Final    BOTTLES DRAWN AEROBIC AND ANAEROBIC Blood Culture adequate volume Performed at [facility], [address] [place], [place] [address]    Culture  Setup Time   Final    Organism ID to follow GRAM POSITIVE COCCI AEROBIC BOTTLE ONLY CRITICAL RESULT CALLED TO, READ BACK BY AND VERIFIED WITH: ABBY ELLINGTON AT 1029 09/22/19 SDR GRAM STAIN REVIEWED-AGREE WITH RESULT Performed at [facility], [place] [address], [place], [place] [address]    Culture STAPHYLOCOCCUS EPIDERMIDIS (A)  Final   Report Status PENDING  Incomplete  Urine culture     Status: None   Collection Time: 09/21/19  3:25 PM   Specimen: In/Out Cath Urine  Result Value Ref Range Status   Specimen Description   Final    IN/OUT CATH URINE Performed at [facility], [address], [place], [place] [address]    Special Requests   Final    NONE Performed at [facility],  [place], [place] [address]    Culture   Final    NO GROWTH Performed at [facility], [place] [address], [place], [place] [address]    Report Status 09/22/2019 FINAL  Final  Blood Culture ID Panel (Reflexed)     Status: Abnormal   Collection Time: 09/21/19  3:25 PM  Result Value Ref Range Status   Enterococcus faecalis NOT DETECTED NOT DETECTED Final   Enterococcus Faecium NOT DETECTED NOT DETECTED Final   Listeria monocytogenes NOT DETECTED NOT DETECTED Final   Staphylococcus species DETECTED (A) NOT DETECTED Final    Comment: CRITICAL RESULT CALLED TO, READ BACK BY AND VERIFIED  WITH:  ABBY ELLINGTON AT 1029 09/22/19 SDR    Staphylococcus aureus (BCID) NOT DETECTED NOT DETECTED Final   Staphylococcus epidermidis DETECTED (A) NOT DETECTED Final    Comment: Methicillin (oxacillin) resistant coagulase negative staphylococcus. Possible blood culture contaminant (unless isolated from more than one blood culture draw or clinical case suggests pathogenicity). No antibiotic treatment is indicated for blood  culture contaminants. CRITICAL RESULT CALLED TO, READ BACK BY AND VERIFIED WITH: ABBY ELLINGTON AT 0981 09/22/19 SDR    Staphylococcus lugdunensis NOT DETECTED NOT DETECTED Final   Streptococcus species NOT DETECTED NOT DETECTED Final   Streptococcus agalactiae NOT DETECTED NOT DETECTED Final   Streptococcus pneumoniae NOT DETECTED NOT DETECTED Final   Streptococcus pyogenes NOT DETECTED NOT DETECTED Final   A.calcoaceticus-baumannii NOT DETECTED NOT DETECTED Final   Bacteroides fragilis NOT DETECTED NOT DETECTED Final   Enterobacterales NOT DETECTED NOT DETECTED Final   Enterobacter cloacae complex NOT DETECTED NOT DETECTED Final   Escherichia coli NOT DETECTED NOT DETECTED Final   Klebsiella aerogenes NOT DETECTED NOT DETECTED Final   Klebsiella oxytoca NOT DETECTED NOT DETECTED Final   Klebsiella pneumoniae NOT DETECTED NOT DETECTED Final   Proteus species NOT DETECTED NOT DETECTED Final   Salmonella species NOT DETECTED NOT DETECTED Final   Serratia marcescens NOT DETECTED NOT DETECTED Final   Haemophilus influenzae NOT DETECTED NOT DETECTED Final   Neisseria meningitidis NOT DETECTED NOT DETECTED Final   Pseudomonas aeruginosa NOT DETECTED NOT DETECTED Final   Stenotrophomonas maltophilia NOT DETECTED NOT DETECTED Final   Candida albicans NOT DETECTED NOT DETECTED Final   Candida auris NOT DETECTED NOT DETECTED Final   Candida glabrata NOT DETECTED NOT DETECTED Final   Candida krusei NOT DETECTED NOT DETECTED Final   Candida parapsilosis NOT DETECTED NOT  DETECTED Final   Candida tropicalis NOT DETECTED NOT DETECTED Final   Cryptococcus neoformans/gattii NOT DETECTED NOT DETECTED Final   Methicillin resistance mecA/C DETECTED (A) NOT DETECTED Final    Comment: CRITICAL RESULT CALLED TO, READ BACK BY AND VERIFIED WITH:  ABBY ELLINGTON AT 1914 09/22/19 SDR Performed at [facility], [address] [place], [place] [address]   SARS Coronavirus 2 by RT PCR (hospital order, performed in [place] hospital lab) Nasopharyngeal Nasopharyngeal Swab     Status: None   Collection Time: 09/21/19  3:28 PM   Specimen: Nasopharyngeal Swab  Result Value Ref Range Status   SARS Coronavirus 2 NEGATIVE NEGATIVE Final    Comment: (NOTE) SARS-CoV-2 target nucleic acids are NOT DETECTED.  The SARS-CoV-2 RNA is generally detectable in upper and lower respiratory specimens during the acute phase of infection. The lowest concentration of SARS-CoV-2 viral copies this assay can detect is 250 copies / mL. A negative result does not preclude SARS-CoV-2 infection and should not be used as the  sole basis for treatment or other patient management decisions.  A negative result may occur with improper specimen collection / handling, submission of specimen other than nasopharyngeal swab, presence of viral mutation(s) within the areas targeted by this assay, and inadequate number of viral copies (<250 copies / mL). A negative result must be combined with clinical observations, patient history, and epidemiological information.  Fact Sheet for Patients:   StrictlyIdeas.no  Fact Sheet for Healthcare Providers: BankingDealers.co.za  This test is not yet approved or  cleared by the [place] FDA and has been authorized for detection and/or diagnosis of SARS-CoV-2 by FDA under an Emergency Use Authorization (EUA).  This EUA will remain in effect (meaning this test can be used) for the duration of the COVID-19  declaration under Section 564(b)(1) of the Act, 21 U.S.C. section 360bbb-3(b)(1), unless the authorization is terminated or revoked sooner.  Performed at [facility], [address] [place], [place] [address]   MRSA PCR Screening     Status: None   Collection Time: 09/21/19 10:16 PM   Specimen: Nasal Mucosa; Nasopharyngeal  Result Value Ref Range Status   MRSA by PCR NEGATIVE NEGATIVE Final    Comment:        The GeneXpert MRSA Assay (FDA approved for NASAL specimens only), is one component of a comprehensive MRSA colonization surveillance program. It is not intended to diagnose MRSA infection nor to guide or monitor treatment for MRSA infections. Performed at [facility], [address], [place], [place] [address]           IMAGING    No results found.   Nutrition Status: Nutrition Problem: Inadequate oral intake Etiology: inability to eat (pt sedated and ventilated) Signs/Symptoms: NPO status       Indwelling Urinary Catheter continued, requirement due to   Reason to continue Indwelling Urinary Catheter strict Intake/Output monitoring for hemodynamic instability   Central Line/ continued, requirement due to  Reason to continue [place] of central venous pressure or other hemodynamic parameters and poor IV access   Ventilator continued, requirement due to severe respiratory failure   Ventilator Sedation RASS 0 to -2      ASSESSMENT AND PLAN SYNOPSIS Severe ACUTE Hypoxic and Hypercapnic Respiratory Failure due to pulm edema and acute COPD exacerbation  Severe ACUTE Hypoxic and Hypercapnic Respiratory Failure -continue Full MV support -continue Bronchodilator Therapy -Wean Fio2 and PEEP as tolerated -will perform SAT/SBT when respiratory parameters are met -VAP/VENT bundle implementation  ACUTE DIASTOLIC CARDIAC FAILURE-  -oxygen as needed -Lasix as tolerated   ACUTE KIDNEY INJURY/Renal Failure -continue  Foley Catheter-assess need -Avoid nephrotoxic agents -Follow urine output, BMP -Ensure adequate renal perfusion, optimize oxygenation -Renal dose medications     NEUROLOGY - intubated and sedated - minimal sedation to achieve a RASS goal: -1 Wake up assessment pending  SHOCK-SEPSIS/HYPOVOLUMIC/CARDIOGENIC -use vasopressors to keep MAP>65 -follow ABG and LA -follow up cultures -emperic ABX -consider stress dose steroids -aggressive IV fluid resuscitation  CARDIAC ICU monitoring   GI GI PROPHYLAXIS as indicated  NUTRITIONAL STATUS Nutrition Status: Nutrition Problem: Inadequate oral intake Etiology: inability to eat (pt sedated and ventilated) Signs/Symptoms: NPO status     DIET--NPO Constipation protocol as indicated  ENDO - will use ICU hypoglycemic\Hyperglycemia protocol if indicated     ELECTROLYTES -follow labs as needed -replace as needed -pharmacy consultation and following   DVT/GI PRX ordered and assessed TRANSFUSIONS AS NEEDED MONITOR FSBS I Assessed the need for Labs I Assessed the need  for Foley I Assessed the need for Central Venous Line Family Discussion when available I Assessed the need for Mobilization I made an Assessment of medications to be adjusted accordingly Safety Risk assessment completed   CASE DISCUSSED IN MULTIDISCIPLINARY ROUNDS WITH ICU TEAM  Critical Care Time devoted to patient care services described in this note is 35 minutes.   Overall, patient is critically ill, prognosis is guarded.  Patient with Multiorgan failure and at high risk for cardiac arrest and death.  Sister at bedside, explained possible trial of extubation today  Codee Bloodworth Patricia Pesa, M.D.  [facility]  Medical Director [place] Director [facility] Cardio-Pulmonary Department

## 2019-09-23 NOTE — Progress Notes (Addendum)
Daily Progress Note   Patient Name: Gregory Hall       Date: 09/23/2019 DOB: Jan 12, 1955  Age: 64 y.o. MRN#: [id number] Attending Physician: Flora Lipps, MD Primary Care Physician: Remi Haggard, FNP Admit Date: 09/21/2019  Reason for Consultation/Follow-up: Establishing goals of care  Subjective: Patient is resting in bed on ventilator. Speaking with sister Terri at bedside. Discussed his diagnoses. Patient is in SBT. He began attempting to pull the breathing tube out. CCM in to bedside. He was asked if he wanted the tube out and he said yes. He was asked if he wanted it put back and he said no. Patient extubated.  Terrri would like patient's feelings on GOC as she would like him to make his own decisions, and he is at risk for increased confusion.   Patient indicates he is at the hospital, his sister is present. He answers yes and no questions correctly. He states he would not want dialysis again, ventilator support, or CPR. Further into the conversation he states he wants to live. Sister continued to attempt clarification, and patient requested I step out for them to talk.   Reintereed room. Patient wants DNR/DNI status and to treat the treatable. MOST from completed in presence of bedside RN Jinny Blossom and patient's sister Karna Christmas.   Length of Stay: 2  Current Medications: Scheduled Meds:  . chlorhexidine gluconate (MEDLINE KIT)  15 mL Mouth Rinse BID  . Chlorhexidine Gluconate Cloth  6 each Topical Q0600  . heparin  5,000 Units Subcutaneous Q8H  . hydrocortisone sod succinate (SOLU-CORTEF) inj  50 mg Intravenous Q6H  . insulin aspart  1-3 Units Subcutaneous Q4H  . ipratropium-albuterol  3 mL Nebulization Q6H  . mouth rinse  15 mL Mouth Rinse 10 times per day  . pantoprazole  (PROTONIX) IV  40 mg Intravenous QHS  . patiromer  8.4 g Oral Daily    Continuous Infusions: . sodium chloride Stopped (09/21/19 1624)  . sodium chloride 75 mL/hr at 09/23/19 0823  . sodium chloride    . sodium chloride    . azithromycin Stopped (09/22/19 1840)  . cefTRIAXone (ROCEPHIN)  IV Stopped (09/22/19 1619)  . fentaNYL infusion INTRAVENOUS 250 mcg/hr (09/23/19 0823)  . norepinephrine (LEVOPHED) Adult infusion 9 mcg/min (09/23/19 0823)  . propofol (DIPRIVAN) infusion 60 mcg/kg/min (09/23/19 0823)  .  vasopressin 0.03 Units/min (09/23/19 0823)    PRN Meds: sodium chloride, sodium chloride, alteplase, docusate, heparin, lidocaine (PF), lidocaine-prilocaine, midazolam, ondansetron (ZOFRAN) IV, pentafluoroprop-tetrafluoroeth, polyethylene glycol  Physical Exam Pulmonary:     Comments: Extubated.  Neurological:     Mental Status: He is alert.             Vital Signs: BP (!) 161/78   Pulse (!) 107   Temp 99 F (37.2 C)   Resp 13   Ht 6' 2.02" (1.88 m)   Wt 95.2 kg   SpO2 (!) 88%   BMI 26.94 kg/m  SpO2: SpO2: (!) 88 % O2 Device: O2 Device: Nasal Cannula O2 Flow Rate: O2 Flow Rate (L/min): 2 L/min  Intake/output summary:   Intake/Output Summary (Last 24 hours) at 09/23/2019 1119 Last data filed at 09/23/2019 9629 Gross per 24 hour  Intake 3473.75 ml  Output 3025 ml  Net 448.75 ml   LBM: Last BM Date:  (PTA) Baseline Weight: Weight: 99 kg Most recent weight: Weight: 95.2 kg       Palliative Assessment/Data:      Patient Active Problem List   Diagnosis Date Noted  . Hyperkalemia   . Acute metabolic encephalopathy   . Acute on chronic respiratory failure with hypoxia ([place]) 09/13/2019  . Acute CHF (congestive heart failure) ([place]) 09/12/2019  . NSTEMI (non-ST elevated myocardial infarction) ([place]) 09/12/2019  . Acute kidney failure, unspecified ([place]) 09/12/2019  . Diabetes mellitus type 2, uncontrolled, with complications ([place]) 52/84/1324  . Chronic pain  syndrome 06/10/2019  . COPD exacerbation ([place]) 06/10/2019  . COPD with acute exacerbation ([place]) 06/09/2019  . CAD (coronary artery disease) 06/09/2019  . HTN (hypertension) 06/09/2019  . Acute on chronic respiratory failure with hypoxia and hypercapnia ([place]) 06/09/2019  . Anxiety 06/09/2019  . Chronic prescription opiate use 06/09/2019  . Hyperglycemia 06/09/2019  . Subclavian artery stenosis, left ([place]) 08/20/2014  . Rectus diastasis 07/19/2012  . Hernia   . Edema 05/10/2011  . Preoperative cardiovascular examination 10/06/2010  . SMOKER 11/25/2009  . CAROTID BRUIT, RIGHT 11/24/2009  . Chest pain 11/24/2009  . Hyperlipidemia 03/30/2009  . Coronary atherosclerosis 03/30/2009  . HYPERTENSION, HX OF 03/30/2009    Palliative Care Assessment & Plan    Recommendations/Plan: DNR/DNI, treat the treatable.    Code Status:    Code Status Orders  (From admission, onward)         Start     Ordered   09/22/19 1550  Limited resuscitation (code)  Continuous       Question Answer Comment  In the event of cardiac or respiratory ARREST: Initiate Code Blue, Call Rapid Response Yes   In the event of cardiac or respiratory ARREST: Perform CPR No   In the event of cardiac or respiratory ARREST: Perform Intubation/Mechanical Ventilation Yes   In the event of cardiac or respiratory ARREST: Use NIPPV/BiPAp only if indicated Yes   In the event of cardiac or respiratory ARREST: Administer ACLS medications if indicated Yes   In the event of cardiac or respiratory ARREST: Perform Defibrillation or Cardioversion if indicated Yes   Comments MOST form in [place]      09/22/19 1551        Code Status History    Date Active Date Inactive Code Status Order ID Comments User Context   09/22/2019 1549 09/22/2019 1551 Partial Code [id number]  Asencion Gowda, NP Inpatient   09/21/2019 1901 09/22/2019 1549 Full Code [id number]  Tyler Pita, MD ED  09/12/2019 2339 09/14/2019 2108 Full Code [id number]   Chotiner, Yevonne Aline, MD ED   06/09/2019 2251 06/14/2019 2200 Full Code [id number]  Athena Masse, MD ED   Advance Care Planning Activity      Prognosis: Poor overall.     Care plan was discussed with CCM, RN  Thank you for allowing the Palliative Medicine Team to assist in the care of this patient.   Time In: 9:30 Time Out: 11:00 Total Time 90 min Prolonged Time Billed  yes       Greater than 50%  of this time was spent counseling and coordinating care related to the above assessment and plan.  Asencion Gowda, NP  Please contact Palliative Medicine Team phone at [phone] for questions and concerns.

## 2019-09-23 NOTE — Progress Notes (Signed)
Dr. Belia Heman notified of pt's HR 150-180. Order received for bolus and labs.

## 2019-09-23 NOTE — Progress Notes (Signed)
Pt extubated at this time to [place]. Pt answers some questions appropriately but is still very drowsy. Sister is at bedside.

## 2019-09-24 DIAGNOSIS — N170 Acute kidney failure with tubular necrosis: Secondary | ICD-10-CM

## 2019-09-24 DIAGNOSIS — I4892 Unspecified atrial flutter: Secondary | ICD-10-CM

## 2019-09-24 DIAGNOSIS — I509 Heart failure, unspecified: Secondary | ICD-10-CM

## 2019-09-24 DIAGNOSIS — J9621 Acute and chronic respiratory failure with hypoxia: Secondary | ICD-10-CM

## 2019-09-24 DIAGNOSIS — I2510 Atherosclerotic heart disease of native coronary artery without angina pectoris: Secondary | ICD-10-CM

## 2019-09-24 DIAGNOSIS — G9341 Metabolic encephalopathy: Secondary | ICD-10-CM

## 2019-09-24 LAB — BASIC METABOLIC PANEL
Anion gap: 8 (ref 5–15)
BUN: 19 mg/dL (ref 8–23)
CO2: 31 mmol/L (ref 22–32)
Calcium: 8.3 mg/dL — ABNORMAL LOW (ref 8.9–10.3)
Chloride: 103 mmol/L (ref 98–111)
Creatinine, Ser: 1.13 mg/dL (ref 0.61–1.24)
GFR calc Af Amer: >60 mL/min (ref >60)
GFR calc non Af Amer: >60 mL/min (ref >60)
Glucose, Bld: 148 mg/dL — ABNORMAL HIGH (ref 70–99)
Potassium: 4.1 mmol/L (ref 3.5–5.1)
Sodium: 142 mmol/L (ref 135–145)

## 2019-09-24 LAB — MAGNESIUM: Magnesium: 2 mg/dL (ref 1.7–2.4)

## 2019-09-24 LAB — GLUCOSE, CAPILLARY
Glucose-Capillary: 144 mg/dL — ABNORMAL HIGH (ref 70–99)
Glucose-Capillary: 151 mg/dL — ABNORMAL HIGH (ref 70–99)
Glucose-Capillary: 192 mg/dL — ABNORMAL HIGH (ref 70–99)
Glucose-Capillary: 197 mg/dL — ABNORMAL HIGH (ref 70–99)
Glucose-Capillary: 200 mg/dL — ABNORMAL HIGH (ref 70–99)
Glucose-Capillary: 222 mg/dL — ABNORMAL HIGH (ref 70–99)

## 2019-09-24 LAB — PROCALCITONIN: Procalcitonin: 0.13 ng/mL

## 2019-09-24 LAB — CULTURE, BLOOD (ROUTINE X 2): Special Requests: ADEQUATE

## 2019-09-24 LAB — PHOSPHORUS: Phosphorus: 2.4 mg/dL — ABNORMAL LOW (ref 2.5–4.6)

## 2019-09-24 LAB — GLOMERULAR BASEMENT MEMBRANE ANTIBODIES: GBM Ab: 3 units (ref 0–20)

## 2019-09-24 MED ORDER — METOPROLOL TARTRATE 5 MG/5ML IV SOLN
5.0000 mg | Freq: Once | INTRAVENOUS | Status: AC
  Administered 2019-09-24: 5 mg via INTRAVENOUS
  Filled 2019-09-24: qty 5

## 2019-09-24 MED ORDER — PANTOPRAZOLE SODIUM 40 MG PO TBEC
40.0000 mg | DELAYED_RELEASE_TABLET | Freq: Every day | ORAL | Status: DC
  Administered 2019-09-24 – 2019-09-27 (×4): 40 mg via ORAL
  Filled 2019-09-24 (×4): qty 1

## 2019-09-24 MED ORDER — DILTIAZEM HCL 30 MG PO TABS
30.0000 mg | ORAL_TABLET | Freq: Four times a day (QID) | ORAL | Status: DC
  Administered 2019-09-24 – 2019-09-25 (×5): 30 mg via ORAL
  Filled 2019-09-24 (×5): qty 1

## 2019-09-24 MED ORDER — RIVAROXABAN 20 MG PO TABS
20.0000 mg | ORAL_TABLET | Freq: Every day | ORAL | Status: DC
  Administered 2019-09-24 – 2019-09-27 (×4): 20 mg via ORAL
  Filled 2019-09-24 (×4): qty 1

## 2019-09-24 MED ORDER — INSULIN ASPART 100 UNIT/ML ~~LOC~~ SOLN: 1.0000 [IU] | Freq: Three times a day (TID) | SUBCUTANEOUS | Status: DC

## 2019-09-24 MED ORDER — ALPRAZOLAM 0.25 MG PO TABS
0.2500 mg | ORAL_TABLET | Freq: Every day | ORAL | Status: DC | PRN
  Administered 2019-09-24 – 2019-09-25 (×2): 0.25 mg via ORAL
  Filled 2019-09-24 (×2): qty 1

## 2019-09-24 MED ORDER — DILTIAZEM LOAD VIA INFUSION
15.0000 mg | Freq: Once | INTRAVENOUS | Status: AC
  Administered 2019-09-24: 15 mg via INTRAVENOUS
  Filled 2019-09-24 (×3): qty 15

## 2019-09-24 MED ORDER — NEPRO/CARBSTEADY PO LIQD
237.0000 mL | Freq: Two times a day (BID) | ORAL | Status: DC
  Administered 2019-09-24 – 2019-09-28 (×3): 237 mL via ORAL

## 2019-09-24 MED ORDER — MORPHINE SULFATE (PF) 2 MG/ML IV SOLN
2.0000 mg | Freq: Once | INTRAVENOUS | Status: AC
  Administered 2019-09-24: 2 mg via INTRAVENOUS
  Filled 2019-09-24: qty 1

## 2019-09-24 MED ORDER — NICOTINE 21 MG/24HR TD PT24
21.0000 mg | MEDICATED_PATCH | Freq: Every day | TRANSDERMAL | Status: DC
  Administered 2019-09-24 – 2019-09-28 (×5): 21 mg via TRANSDERMAL
  Filled 2019-09-24 (×4): qty 1

## 2019-09-24 MED ORDER — DILTIAZEM HCL-DEXTROSE 125-5 MG/125ML-% IV SOLN (PREMIX)
5.0000 mg/h | INTRAVENOUS | Status: DC
  Administered 2019-09-24: 5 mg/h via INTRAVENOUS
  Administered 2019-09-24 – 2019-09-25 (×2): 10 mg/h via INTRAVENOUS
  Filled 2019-09-24 (×3): qty 125

## 2019-09-24 MED ORDER — LACTATED RINGERS IV BOLUS
500.0000 mL | Freq: Once | INTRAVENOUS | Status: AC
  Administered 2019-09-24: 500 mL via INTRAVENOUS

## 2019-09-24 MED ORDER — METOPROLOL TARTRATE 25 MG PO TABS
25.0000 mg | ORAL_TABLET | Freq: Two times a day (BID) | ORAL | Status: DC
  Administered 2019-09-24 – 2019-09-25 (×3): 25 mg via ORAL
  Filled 2019-09-24 (×3): qty 1

## 2019-09-24 MED ORDER — PREGABALIN 75 MG PO CAPS
200.0000 mg | ORAL_CAPSULE | Freq: Three times a day (TID) | ORAL | Status: DC
  Administered 2019-09-24 – 2019-09-28 (×14): 200 mg via ORAL
  Filled 2019-09-24: qty 1
  Filled 2019-09-24: qty 2
  Filled 2019-09-24: qty 1
  Filled 2019-09-24 (×2): qty 2
  Filled 2019-09-24 (×5): qty 1
  Filled 2019-09-24: qty 2
  Filled 2019-09-24 (×3): qty 1

## 2019-09-24 NOTE — Progress Notes (Signed)
Patients HR remains in the 140s despite additional IVFs,  Amiodarone boluses 150mg  x2 and initiation of Amiodarone gtt. On monitor appears to look sinus. Repeated EKG which revealed Atrial flutter and prolonged QTC interval of 571.  --D/Cd Amio gtt --Cardizem bolus 15mg  IV x1 followed by initiated of Cardizem gtt    ACNP-BC

## 2019-09-24 NOTE — Consult Note (Addendum)
Cardiology Consultation:   Patient ID: Gregory Hall MRN: [id number]; DOB: 1955-02-19  Admit date: 09/21/2019 Date of Consult: 09/24/2019  Primary Care Provider: Remi Haggard, FNP [facility] Cardiologist: Kathlyn Sacramento, MD  [facility] Electrophysiologist:  None    Patient Profile:   Gregory Hall is a 64 y.o. male with a hx of CAD status post PCI, COPD, current smoker, who is being seen today for the evaluation of atrial flutter at the request of Dr. Mortimer Fries.  History of Present Illness:   Gregory Hall is a 64 year old gentleman with history of CAD PCI/LAD 2010, COPD, current smoker x40+ years, diabetes, hypertension who presents to the hospital after being found unresponsive.  Patient was found laying in his home in respiratory distress by a friend.  Does not remember what happened much prior to the incident.  He was brought to the hospital by ambulance.  He was noted to be in respiratory failure, secondary to COPD exacerbation.  Was intubated, subsequently extubated, currently on nasal cannula satting okay.  Denies chest pain, has not followed up with cardiology for some years now.  Previously saw Dr. Fletcher Anon back in 2017 in the office.  He went to the [facility] for the past 3 years and did not follow-up with cardiology.  Came back to [facility] March of this year.  States he still smokes.   EKG on admission showed sinus rhythm.  He subsequently went into atrial flutter with rapid ventricular response.  She was started on Cardizem drip, Xarelto 20 mg was also continued.  He states being on Xarelto due to history of CAD.  Denies any prior episodes of arrhythmia such as A. fib or flutter.   Past Medical History:  Diagnosis Date  . AKI (acute kidney injury) ([place]) 09/12/2019  . CAD (coronary artery disease)    s/p PTCA and stent x2  . Chest pain   . Chronic pain syndrome   . COPD (chronic obstructive pulmonary disease) ([place])   . Degenerative  cervical disc   . Depression   . Diabetes mellitus without complication ([place])   . Dyslipidemia   . GERD (gastroesophageal reflux disease)   . Hernia 2014  . Hypertension   . MRSA (methicillin resistant staph aureus) culture positive 2011  . Neuropathy   . Nutcracker esophagus   . Rectus diastasis 07/19/2012    Past Surgical History:  Procedure Laterality Date  . BACK SURGERY  2012  . CHOLECYSTECTOMY    . COLONOSCOPY  Jan 2014   [name]  . CORONARY ANGIOPLASTY WITH STENT PLACEMENT  2009   stents x2, in [place], [place]  . FOOT SURGERY     Right  . NECK SURGERY    . SPINE SURGERY  2012,2013  . TONSILLECTOMY       Home Medications:  Prior to Admission medications   Medication Sig Start Date End Date Taking? Authorizing Provider  acetylcysteine (MUCOMYST) 20 % nebulizer solution Take 4 mLs by nebulization every 7 (seven) days. 09/16/19  Yes [provider]  aspirin EC 81 MG tablet Take 81 mg by mouth daily.   Yes [provider]  atorvastatin (LIPITOR) 20 MG tablet Take 60 mg by mouth at bedtime.    Yes [provider]  baclofen (LIORESAL) 20 MG tablet Take 20 mg by mouth every 8 (eight) hours. 09/02/19  Yes [provider]  Cholecalciferol (VITAMIN D3) 50 MCG (2000 UT) TABS Take 2,000 Units by mouth daily.    Yes [provider]  cyclobenzaprine (FLEXERIL) 5 MG tablet Take 1 tablet (5 mg total) by mouth 3 (three) times daily as needed. Patient taking differently: Take 5 mg by mouth 2 (two) times daily as needed for muscle spasms.  05/25/19  Yes Menshew, Dannielle Karvonen, PA-C  DULoxetine (CYMBALTA) 60 MG capsule Take 60 mg by mouth daily.   Yes [provider]  Ensure Max Protein (ENSURE MAX PROTEIN) LIQD Take 330 mLs (11 oz total) by mouth 2 (two) times daily between meals. 06/14/19  Yes Sheikh, Omair Latif, DO  Fluticasone-Umeclidin-Vilant (TRELEGY ELLIPTA) 100-62.5-25 MCG/INH AEPB Inhale 1 puff into the lungs daily.   Yes [provider]  furosemide (LASIX) 20 MG tablet Take 1 tablet (20 mg total) by mouth daily. Patient taking differently: Take 20 mg by mouth daily as needed for fluid.  09/14/19  Yes Danford, Suann Larry, MD  guaiFENesin (MUCINEX) 600 MG 12 hr tablet Take 600 mg by mouth 2 (two) times daily.   Yes [provider]  insulin glargine (LANTUS) 100 UNIT/ML injection Inject 22 Units into the skin at bedtime.    Yes [provider]  insulin lispro (HUMALOG) 100 UNIT/ML injection Inject 4 Units into the skin 3 (three) times daily before meals. Hold for FSBG <100   Yes [provider]  ipratropium-albuterol (DUONEB) 0.5-2.5 (3) MG/3ML SOLN Take 3 mLs by nebulization every 6 (six) hours as needed. Patient taking differently: Take 3 mLs by nebulization every 6 (six) hours as needed (wheezing and shortness of breath).  06/14/19  Yes Sheikh, Omair Latif, DO  lactulose (CHRONULAC) 10 GM/15ML solution Take 20 g by mouth daily as needed for mild constipation.   Yes [provider]  lamoTRIgine (LAMICTAL) 100 MG tablet Take 100 mg by mouth daily.   Yes [provider]  Menthol-Methyl Salicylate (SALONPAS PAIN RELIEF PATCH EX) Place 1 patch onto the skin every 12 (twelve) hours as needed (aches and pains).   Yes [provider]  metFORMIN (GLUCOPHAGE-XR) 750 MG 24 hr tablet Take 1 tablet (750 mg total) by mouth 2 (two) times daily. 09/16/19  Yes Danford, Suann Larry, MD  metoprolol tartrate (LOPRESSOR) 25 MG tablet Take 12.5 mg by mouth 2 (two) times daily.   Yes [provider]  NAC 600 MG CAPS Take 600 mg by mouth daily. 09/08/19  Yes [provider]  NITROSTAT 0.4 MG SL tablet DISSOLVE (1) TABLET UNDER TONGUE AS NEEDED TO RELIEVE CHEST PAIN. MAYREPEAT EVERY 5 MINUTES. Patient taking differently: Place 0.4 mg under the tongue every 5 (five) minutes as needed for chest pain.  10/06/15  Yes Wellington Hampshire, MD  ondansetron (ZOFRAN) 4 MG tablet Take 4  mg by mouth every 8 (eight) hours as needed for nausea or vomiting.    Yes [provider]  Oxycodone HCl 10 MG TABS Take 1 tablet (10 mg total) by mouth 3 (three) times daily. 09/14/19  Yes Danford, Suann Larry, MD  oxymetazoline (AFRIN) 0.05 % nasal spray Place 2 sprays into both nostrils 2 (two) times daily as needed for congestion.   Yes [provider]  pantoprazole (PROTONIX) 40 MG tablet Take 40 mg by mouth daily.    Yes [provider]  pregabalin (LYRICA) 200 MG capsule Take 1 capsule (200 mg total) by mouth 3 (three) times daily. 09/14/19  Yes Danford, Suann Larry, MD  rivaroxaban (XARELTO) 20 MG TABS tablet Take 20 mg by mouth daily with supper.   Yes [provider]  senna-docusate (SENOKOT-S)  8.6-50 MG tablet Take 1 tablet by mouth daily.    Yes [provider]  traZODone (DESYREL) 100 MG tablet Take 100 mg by mouth at bedtime. 09/08/19  Yes [provider]  lisinopril (ZESTRIL) 20 MG tablet Take 1 tablet (20 mg total) by mouth daily. 09/16/19   Danford, Suann Larry, MD    Inpatient Medications: Scheduled Meds: . chlorhexidine gluconate (MEDLINE KIT)  15 mL Mouth Rinse BID  . Chlorhexidine Gluconate Cloth  6 each Topical Q0600  . diltiazem  30 mg Oral Q6H  . insulin aspart  1-3 Units Subcutaneous Q4H  . ipratropium-albuterol  3 mL Nebulization Q6H  . metoprolol tartrate  25 mg Oral BID  . nicotine  21 mg Transdermal Daily  . pantoprazole  40 mg Oral QHS  . pregabalin  200 mg Oral TID  . rivaroxaban  20 mg Oral Q supper   Continuous Infusions: . sodium chloride Stopped (09/21/19 1624)  . sodium chloride 75 mL/hr at 09/24/19 1400  . sodium chloride    . sodium chloride    . diltiazem (CARDIZEM) infusion 7.5 mg/hr (09/24/19 1400)   PRN Meds: sodium chloride, sodium chloride, ALPRAZolam, alteplase, docusate, heparin, lidocaine (PF), lidocaine-prilocaine, melatonin, morphine injection, ondansetron (ZOFRAN) IV,  pentafluoroprop-tetrafluoroeth, polyethylene glycol  Allergies:    Allergies  Allergen Reactions  . Acetaminophen Other (See Comments)    Reaction:  Unknown  Kidney failure Told not to take from home M.D. Related to kidney and renal failure   . Nsaids Other (See Comments)    Reaction:  Unknown  Kidney failure Patient states not to take from home M.D. Related to kidney and renal failure.  Other reaction(s): Unknown    Social History:   Social History   Socioeconomic History  . Marital status: Single    Spouse name: Not on file  . Number of children: Not on file  . Years of education: Not on file  . Highest education level: Not on file  Occupational History  . Not on file  Tobacco Use  . Smoking status: Current Every Day Smoker    Packs/day: 0.50    Years: 30.00    Pack years: 15.00  . Smokeless tobacco: Never Used  Substance and Sexual Activity  . Alcohol use: No  . Drug use: No  . Sexual activity: Not on file  Other Topics Concern  . Not on file  Social History Narrative  . Not on file   Social Determinants of Health   Financial Resource Strain:   . Difficulty of Paying Living Expenses: Not on file  Food Insecurity:   . Worried About Charity fundraiser in the Last Year: Not on file  . Ran Out of Food in the Last Year: Not on file  Transportation Needs:   . Lack of Transportation (Medical): Not on file  . Lack of Transportation (Non-Medical): Not on file  Physical Activity:   . Days of Exercise per Week: Not on file  . Minutes of Exercise per Session: Not on file  Stress:   . Feeling of Stress : Not on file  Social Connections:   . Frequency of Communication with Friends and Family: Not on file  . Frequency of Social Gatherings with Friends and Family: Not on file  . Attends Religious Services: Not on file  . Active Member of Clubs or Organizations: Not on file  . Attends Archivist Meetings: Not on file  . Marital Status: Not on file   Intimate Partner  Violence:   . Fear of Current or Ex-Partner: Not on file  . Emotionally Abused: Not on file  . Physically Abused: Not on file  . Sexually Abused: Not on file    Family History:    Family History  Family history unknown: Yes     ROS:  Please see the history of present illness.   All other ROS reviewed and negative.     Physical Exam/Data:   Vitals:   09/24/19 1300 09/24/19 1330 09/24/19 1400 09/24/19 1430  BP: 137/69 114/63 119/62 119/70  Pulse: (!) 49 76 77 (!) 43  Resp: (!) _0 Temp:      TempSrc:      SpO2: 99% 100% 100% 100%  Weight:      Height:        Intake/Output Summary (Last 24 hours) at 09/24/2019 1513 Last data filed at 09/24/2019 1400 Gross per 24 hour  Intake 3104.92 ml  Output 1025 ml  Net 2079.92 ml   Last 3 Weights 09/24/2019 09/23/2019 09/22/2019  Weight (lbs) 209 lb 14.1 oz 209 lb 14.1 oz 210 lb 8.6 oz  Weight (kg) 95.2 kg 95.2 kg 95.5 kg     Body mass index is 26.94 kg/m.  General:  Well nourished, well developed, mild respiratory distress HEENT: normal Lymph: no adenopathy Neck: no JVD Endocrine:  No thryomegaly Vascular: No carotid bruits; FA pulses 2+ bilaterally without bruits  Cardiac: Irregular irregular Lungs: Decreased breath sounds, mild expiratory wheezing Abd: soft, nontender, no hepatomegaly  Ext: no edema Musculoskeletal:  No deformities, BUE and BLE strength normal and equal Skin: warm and dry  Neuro:  CNs 2-12 intact, no focal abnormalities noted Psych:  Normal affect   EKG:  The EKG was personally reviewed and demonstrates: Atrial flutter, heart rate 137 Telemetry:  Telemetry was personally reviewed and demonstrates: Atrial flutter  Relevant CV Studies: Echo 09/2019 1. Left ventricular ejection fraction, by estimation, is 65 to 70%. The  left ventricle has normal function. The left ventricle has no regional  wall motion abnormalities. Left ventricular diastolic parameters are  consistent with  Grade II diastolic  dysfunction (pseudonormalization).  2. Right ventricular systolic function is normal. The right ventricular  size is normal. There is normal pulmonary artery systolic pressure.  3. The mitral valve is normal in structure. No evidence of mitral valve  regurgitation.  4. The aortic valve was not well visualized. Aortic valve regurgitation  not assessed.  5. Pulmonic valve regurgitation not assessed.  6. The inferior vena cava is normal in size with greater than 50%  respiratory variability, suggesting right atrial pressure of 3 mmHg.   Laboratory Data:  High Sensitivity Troponin:   Recent Labs  Lab 09/12/19 2043 09/21/19 1526 09/21/19 1920  TROPONINIHS 78* 29* 21*     Chemistry Recent Labs  Lab 09/23/19 0330 09/23/19 1701 09/24/19 0421  NA 140 144 142  K 5.5* 4.4 4.1  CL 101 102 103  CO2 _1 GLUCOSE 228* 141* 148*  BUN 37* 26* 19  CREATININE 1.52* 1.12 1.13  CALCIUM 8.0* 8.1* 8.3*  GFRNONAA 47* >60 >60  GFRAA 55* >60 >60  ANIONGAP _2 Recent Labs  Lab 09/21/19 1526 09/23/19 1701  PROT 6.1* 5.2*  ALBUMIN 3.1* 2.6*  AST 24 26  ALT 18 22  ALKPHOS 79 64  BILITOT 0.9 0.6   Hematology Recent Labs  Lab 09/21/19 1526 09/22/19 0515  WBC 11.7* 10.0  RBC 4.13* 3.99*  HGB 11.5* 11.1*  HCT 35.5* 34.8*  MCV 86.0 87.2  MCH 27.8 27.8  MCHC 32.4 31.9  RDW 17.9* 18.6*  PLT 328 353   BNP Recent Labs  Lab 09/21/19 1527  BNP 247.5*    DDimer No results for input(s): DDIMER in the last 168 hours.   Radiology/Studies:  US RENAL  Result Date: 10/05/19 CLINICAL DATA:  Acute renal failure EXAM: RENAL / URINARY TRACT ULTRASOUND COMPLETE COMPARISON:  None. FINDINGS: Right Kidney: Renal measurements: 10.9 x 5.7 x 5.0 cm = volume: 163 mL. Echogenic renal parenchyma. No mass or hydronephrosis visualized. Left Kidney: Renal measurements: 11.0 x 5.3 x 6.0 cm = volume: 180 mL. Echogenic renal parenchyma. No mass or hydronephrosis  visualized. Bladder: Decompressed by indwelling Foley catheter Other: None. IMPRESSION: Echogenic renal parenchyma, suggesting medical renal disease. No hydronephrosis. Bladder decompressed by indwelling Foley catheter. Electronically Signed   By: Julian Hy M.D.   On: 05-Oct-2019 09:36   DG Chest Port 1 View  Result Date: 05-Oct-2019 CLINICAL DATA:  Hypoxia EXAM: PORTABLE CHEST 1 VIEW COMPARISON:  September 21, 2019 FINDINGS: Endotracheal tube tip is 8.4 cm above the carina. Nasogastric tube tip and side port are below the diaphragm. Stimulator leads are overlying the midthoracic region. No pneumothorax. There is atelectatic change in the lung bases with partial clearing of airspace opacity from the bases. No new opacity evident. Heart is upper normal in size with pulmonary vascularity normal. No adenopathy. There is a stent in the left subclavian artery region. There is aortic atherosclerosis. No bone lesions. IMPRESSION: Tube and catheter positions as described without pneumothorax. Bibasilar atelectasis with partial clearing of airspace opacity from the bases. No new opacity. Stable cardiac silhouette. Aortic Atherosclerosis (ICD10-I70.0). Electronically Signed   By: Lowella Grip III M.D.   On: 10-05-2019 08:05   DG Chest Portable 1 View  Result Date: 09/21/2019 CLINICAL DATA:  Post intubation. EXAM: PORTABLE CHEST 1 VIEW COMPARISON:  09/21/2019 FINDINGS: There is a worsening airspace opacity at the right lung base. The endotracheal tube terminates above the carina by approximately 4.5 cm. The enteric tube appears to terminate at the level of the diaphragm. Coarse airspace opacities are noted at the lung bases. There is no pneumothorax or significant pleural effusion. A left subclavian artery stent is noted the lungs are hyperexpanded. Emphysematous changes are again noted. IMPRESSION: 1. Lines and tubes as above. The enteric tube is not appear to extend into the stomach. Repositioning is  recommended. 2. New airspace opacity at the right lung base concerning for worsening atelectasis or a developing infiltrate. There is persistent atelectasis at the left lung base. These results will be called to the ordering clinician or representative by the Radiologist Assistant, and communication documented in the PACS or [employer]. Electronically Signed   By: Constance Holster M.D.   On: 09/21/2019 18:30   DG Chest Port 1 View  Result Date: 09/21/2019 CLINICAL DATA:  64 year old with possible sepsis and acute mental status changes. Patient lethargic and unresponsive. Hypotension. Current smoker. Follow-up pneumonia. EXAM: PORTABLE CHEST 1 VIEW COMPARISON:  09/12/2019 and earlier, including CTA chest 06/12/2019. FINDINGS: Cardiac silhouette normal in size for AP portable technique, unchanged. Thoracic aorta atherosclerotic, unchanged. Prominent central pulmonary arteries, unchanged. Since the most recent prior examination 9 days ago, improved aeration in the lung bases with near complete resolution of the airspace opacities present at that time. Residual mild linear atelectasis at the RIGHT lung base. No new pulmonary parenchymal abnormalities.  Emphysematous changes as noted on the prior CT. Dorsal cord stimulating device overlies the mid thoracic spine, unchanged. IMPRESSION: Near complete resolution of the bibasilar pneumonia since the most recent prior examination 9 days ago. Residual mild linear atelectasis at the RIGHT lung base. Electronically Signed   By: Evangeline Dakin M.D.   On: 09/21/2019 16:05           Assessment and Plan:   1.  Atrial flutter, new onset -Still in atrial flutter -Heart rate improved on Cardizem drip -Start Cardizem p.o. 30 mg every 6 hours, titrate off Cardizem drip -Continue Lopressor 25 mg twice daily -CHA2DS2-VASc score 4 (age, htn, dm, vasc) -Xarelto 20 mg daily. -Echo shows normal EF,  2.  CAD/PCI -Aspirin, Xarelto, Lipitor -Lopressor  3.   Tobacco abuse -Cessation advised  4.  HFpEF, ( G2DD) -Patient appears euvolemic on my exam -If hypervolemia occurs, consider diuretics. -Avoid nephrotoxins due to resolving/improving renal function  5.  COPD exacerbation, respiratory failure -Nebs treatment as per primary team.    Signed, Kate Sable, MD  09/24/2019 3:13 PM

## 2019-09-24 NOTE — TOC Initial Note (Signed)
Transition of Care [facility]) - Initial/Assessment Note    Patient Details  Name: Gregory Hall MRN: [id number] Date of Birth: Dec 30, 1955  [facility] CM/SW Contact:    Liliana Cline, LCSW Phone Number: 09/24/2019, 2:30 PM  Clinical Narrative:          CSW spoke with patient for Readmission Screening. Patient confirmed he is from [facility] and plans to return there at discharge. PCP is Franco Nones. Patient has a 3 in 1, wheelchair, and oxygen at home. Pharmacy is [facility] in [place], [place]. Patient asked if he can get his 2nd COVID vaccine while in the hospital since he is due for it, CSW asked MD about this.   CSW also spoke with Annettee, [facility] Representative at [phone]. She confirmed they can accept patient back to the [facility] at discharge.  CSW will continue to follow.          Expected Discharge Plan: Assisted Living Barriers to Discharge: Continued Medical Work up   Patient Goals and CMS Choice Patient states their goals for this hospitalization and ongoing recovery are:: to return to [facility] CMS Medicare.gov Compare Post Acute Care list provided to:: Patient Choice offered to / list presented to : Patient  Expected Discharge Plan and Services Expected Discharge Plan: Assisted Living       Living arrangements for the past 2 months: Assisted Living Facility                                      Prior Living Arrangements/Services Living arrangements for the past 2 months: Assisted Living Facility Lives with:: Facility Resident Patient language and need for interpreter reviewed:: Yes Do you feel safe going back to the place where you live?: Yes      Need for Family Participation in Patient Care: No (Comment) Care giver support system in place?: Yes (comment) Current home services: DME Criminal Activity/Legal Involvement Pertinent to Current Situation/Hospitalization: No - Comment as  needed  Activities of Daily Living      Permission Sought/Granted Permission sought to share information with : Photographer granted to share info w AGENCY: [facility]        Emotional Assessment Appearance:: Appears stated age Attitude/Demeanor/Rapport: Engaged Affect (typically observed): Calm, Appropriate Orientation: : Oriented to Self, Oriented to Place, Oriented to  Time, Oriented to Situation Alcohol / Substance Use: Not Applicable Psych Involvement: No (comment)  Admission diagnosis:  Acute respiratory failure with hypoxia (HCC) [J96.01] Acute on chronic respiratory failure with hypoxia (HCC) [J96.21] Acute renal failure, unspecified acute renal failure type (HCC) [N17.9] Type 2 diabetes mellitus without complication, with long-term current use of insulin (HCC) [E11.9, Z79.4] Acute metabolic encephalopathy [G93.41] Chronic congestive heart failure, unspecified heart failure type (HCC) [I50.9] Patient Active Problem List   Diagnosis Date Noted  . Hyperkalemia   . Acute metabolic encephalopathy   . Acute on chronic respiratory failure with hypoxia (HCC) 09/13/2019  . Acute CHF (congestive heart failure) (HCC) 09/12/2019  . NSTEMI (non-ST elevated myocardial infarction) (HCC) 09/12/2019  . Acute kidney failure, unspecified (HCC) 09/12/2019  . Diabetes mellitus type 2, uncontrolled, with complications (HCC) 06/10/2019  . Chronic pain syndrome 06/10/2019  . COPD exacerbation (HCC) 06/10/2019  . COPD with acute exacerbation (HCC) 06/09/2019  . CAD (coronary artery disease) 06/09/2019  . HTN (hypertension) 06/09/2019  .  Acute on chronic respiratory failure with hypoxia and hypercapnia (HCC) 06/09/2019  . Anxiety 06/09/2019  . Chronic prescription opiate use 06/09/2019  . Hyperglycemia 06/09/2019  . Subclavian artery stenosis, left (HCC) 08/20/2014  . Rectus diastasis 07/19/2012  . Hernia   . Edema 05/10/2011  . Goals of care,  counseling/discussion 10/06/2010  . SMOKER 11/25/2009  . CAROTID BRUIT, RIGHT 11/24/2009  . Chest pain 11/24/2009  . Hyperlipidemia 03/30/2009  . Coronary atherosclerosis 03/30/2009  . HYPERTENSION, HX OF 03/30/2009   PCP:  Armando Gang, FNP Pharmacy:   [facility] - [place], [place] - [address] [place] [address] Phone: [phone] Fax: [phone]     Social Determinants of Health (SDOH) Interventions    Readmission Risk Interventions Readmission Risk Prevention Plan 09/24/2019 09/14/2019  Transportation Screening Complete Complete  PCP or Specialist Appt within 3-5 Days - Complete  HRI or Home Care Consult - Complete  Social Work Consult for Recovery Care Planning/Counseling - Complete  Palliative Care Screening - Not Applicable  Medication Review Oceanographer) Complete Complete  PCP or Specialist appointment within 3-5 days of discharge Complete -  HRI or Home Care Consult Complete -  Palliative Care Screening Complete -  Skilled Nursing Facility Complete -  Some recent data might be hidden

## 2019-09-24 NOTE — Progress Notes (Signed)
Report called to Serina Cowper, RN nurse receiving patient to room 251. Pt transported via bed by NT and RN. Belongings sent with pt. Receiving RN verbalizes no further questions or needs at this time.

## 2019-09-24 NOTE — Progress Notes (Signed)
SYNOPSIS Severe ACUTE Hypoxic and Hypercapnic Respiratory Failuredue to pulm edema and acute COPD exacerbation    CC  Follow up resp failure   HPI S/p extubation DNR/DNI afib with RVR Needs cardioology assessment Patient is alert and awake       VITALS: BP 136/83   Pulse (!) 146   Temp 99 F (37.2 C) (Oral)   Resp 13   Ht 6' 2.02" (1.88 m)   Wt 95.2 kg   SpO2 97%   BMI 26.94 kg/m     I/O last 3 completed shifts: In: 4482.4 [I.V.:3721.7; IV Piggyback:760.7] Out: 2675 [Urine:2575; Emesis/NG output:100] No intake/output data recorded.  SpO2: 97 % O2 Flow Rate (L/min): 3 L/min FiO2 (%): 32 %  Estimated body mass index is 26.94 kg/m as calculated from the following:   Height as of this encounter: 6' 2.02" (1.88 m).   Weight as of this encounter: 95.2 kg.   Review of Systems:  Gen:  Denies  fever, sweats, chills weigh loss  HEENT: Denies blurred vision, double vision, ear pain, eye pain, hearing loss, nose bleeds, sore throat Cardiac:  No dizziness, chest pain or heaviness, chest tightness,edema, No JVD Resp:  + SOB Other:  All other systems negative      Physical Examination:   General Appearance: No distress  Neuro:without focal findings,  speech normal,  HEENT: PERRLA, EOM intact.   Pulmonary: normal breath sounds, No wheezing.  CardiovascularNormal S1,S2.  No m/r/g.   PSYCHIATRIC: Mood, affect within normal limits.     I personally reviewed Labs under Results section.   MEDICATIONS: I have reviewed all medications and confirmed regimen as documented   CULTURE RESULTS   Recent Results (from the past 240 hour(s))  Blood Culture (routine x 2)     Status: None (Preliminary result)   Collection Time: 09/21/19  3:25 PM   Specimen: BLOOD  Result Value Ref Range Status   Specimen Description BLOOD LEFT ARM  Final   Special Requests   Final    BOTTLES DRAWN AEROBIC AND ANAEROBIC Blood Culture adequate volume   Culture   Final    NO GROWTH 3  DAYS Performed at [facility], [address] [place], [place] [address]    Report Status PENDING  Incomplete  Blood Culture (routine x 2)     Status: Abnormal (Preliminary result)   Collection Time: 09/21/19  3:25 PM   Specimen: BLOOD  Result Value Ref Range Status   Specimen Description   Final    BLOOD RIGHT ARM Performed at [facility], [address], [place], [place] [address]    Special Requests   Final    BOTTLES DRAWN AEROBIC AND ANAEROBIC Blood Culture adequate volume Performed at [facility], [address] [place], [place] [address]    Culture  Setup Time   Final    Organism ID to follow GRAM POSITIVE COCCI AEROBIC BOTTLE ONLY CRITICAL RESULT CALLED TO, READ BACK BY AND VERIFIED WITH: ABBY ELLINGTON AT 1029 09/22/19 SDR GRAM STAIN REVIEWED-AGREE WITH RESULT Performed at [facility], [address] [place], [place] [address]    Culture STAPHYLOCOCCUS EPIDERMIDIS (A)  Final   Report Status PENDING  Incomplete  Urine culture     Status: None   Collection Time: 09/21/19  3:25 PM   Specimen: In/Out Cath Urine  Result Value Ref Range Status   Specimen Description   Final    IN/OUT CATH URINE Performed at [facility], [address], [place], [place] [address]    Special Requests   Final    NONE Performed at [facility], [address], [place], [place] [address]    Culture   Final    NO GROWTH Performed at [facility], [address], [place], [place] [address]    Report Status 09/22/2019 FINAL  Final  Blood Culture ID Panel (Reflexed)     Status: Abnormal   Collection Time: 09/21/19  3:25 PM  Result Value Ref Range Status   Enterococcus faecalis NOT DETECTED NOT DETECTED Final   Enterococcus Faecium NOT DETECTED NOT DETECTED Final   Listeria monocytogenes NOT DETECTED NOT DETECTED Final   Staphylococcus species DETECTED (A) NOT DETECTED Final    Comment: CRITICAL RESULT CALLED TO, READ  BACK BY AND VERIFIED WITH:  ABBY ELLINGTON AT 1029 09/22/19 SDR    Staphylococcus aureus (BCID) NOT DETECTED NOT DETECTED Final   Staphylococcus epidermidis DETECTED (A) NOT DETECTED Final    Comment: Methicillin (oxacillin) resistant coagulase negative staphylococcus. Possible blood culture contaminant (unless isolated from more than one blood culture draw or clinical case suggests pathogenicity). No antibiotic treatment is indicated for blood  culture contaminants. CRITICAL RESULT CALLED TO, READ BACK BY AND VERIFIED WITH: ABBY ELLINGTON AT 1029 09/22/19 SDR    Staphylococcus lugdunensis NOT DETECTED NOT DETECTED Final   Streptococcus species NOT DETECTED NOT DETECTED Final   Streptococcus agalactiae NOT DETECTED NOT DETECTED Final   Streptococcus pneumoniae NOT DETECTED NOT DETECTED Final   Streptococcus pyogenes NOT DETECTED NOT DETECTED Final   A.calcoaceticus-baumannii NOT DETECTED NOT DETECTED Final   Bacteroides fragilis NOT DETECTED NOT DETECTED Final   Enterobacterales NOT DETECTED NOT DETECTED Final   Enterobacter cloacae complex NOT DETECTED NOT DETECTED Final   Escherichia coli NOT DETECTED NOT DETECTED Final   Klebsiella aerogenes NOT DETECTED NOT DETECTED Final   Klebsiella oxytoca NOT DETECTED NOT DETECTED Final   Klebsiella pneumoniae NOT DETECTED NOT DETECTED Final   Proteus species NOT DETECTED NOT DETECTED Final   Salmonella species NOT DETECTED NOT DETECTED Final   Serratia marcescens NOT DETECTED NOT DETECTED Final   Haemophilus influenzae NOT DETECTED NOT DETECTED Final   Neisseria meningitidis NOT DETECTED NOT DETECTED Final   Pseudomonas aeruginosa NOT DETECTED NOT DETECTED Final   Stenotrophomonas maltophilia NOT DETECTED NOT DETECTED Final   Candida albicans NOT DETECTED NOT DETECTED Final   Candida auris NOT DETECTED NOT DETECTED Final   Candida glabrata NOT DETECTED NOT DETECTED Final   Candida krusei NOT DETECTED NOT DETECTED Final   Candida parapsilosis  NOT DETECTED NOT DETECTED Final   Candida tropicalis NOT DETECTED NOT DETECTED Final   Cryptococcus neoformans/gattii NOT DETECTED NOT DETECTED Final   Methicillin resistance mecA/C DETECTED (A) NOT DETECTED Final    Comment: CRITICAL RESULT CALLED TO, READ BACK BY AND VERIFIED WITH:  ABBY ELLINGTON AT 1029 09/22/19 SDR Performed at [facility], [address] [place], [place] [address]   SARS Coronavirus 2 by RT PCR (hospital order, performed in [facility] hospital lab) Nasopharyngeal Nasopharyngeal Swab     Status: None   Collection Time: 09/21/19  3:28 PM   Specimen: Nasopharyngeal Swab  Result Value Ref Range Status   SARS Coronavirus 2 NEGATIVE NEGATIVE Final    Comment: (NOTE) SARS-CoV-2 target nucleic acids are NOT DETECTED.  The SARS-CoV-2 RNA is generally detectable in upper and lower respiratory specimens during the acute phase of infection. The lowest concentration of SARS-CoV-2 viral copies this  assay can detect is 250 copies / mL. A negative result does not preclude SARS-CoV-2 infection and should not be used as the sole basis for treatment or other patient management decisions.  A negative result may occur with improper specimen collection / handling, submission of specimen other than nasopharyngeal swab, presence of viral mutation(s) within the areas targeted by this assay, and inadequate number of viral copies (<250 copies / mL). A negative result must be combined with clinical observations, patient history, and epidemiological information.  Fact Sheet for Patients:   BoilerBrush.com.cy  Fact Sheet for Healthcare Providers: https://pope.com/  This test is not yet approved or  cleared by the [place] FDA and has been authorized for detection and/or diagnosis of SARS-CoV-2 by FDA under an Emergency Use Authorization (EUA).  This EUA will remain in effect (meaning this test can be used) for the duration  of the COVID-19 declaration under Section 564(b)(1) of the Act, 21 U.S.C. section 360bbb-3(b)(1), unless the authorization is terminated or revoked sooner.  Performed at [facility], [address] [place], [place] [address]   MRSA PCR Screening     Status: None   Collection Time: 09/21/19 10:16 PM   Specimen: Nasal Mucosa; Nasopharyngeal  Result Value Ref Range Status   MRSA by PCR NEGATIVE NEGATIVE Final    Comment:        The GeneXpert MRSA Assay (FDA approved for NASAL specimens only), is one component of a comprehensive MRSA colonization surveillance program. It is not intended to diagnose MRSA infection nor to guide or monitor treatment for MRSA infections. Performed at [facility], [address] [place], [place] [address]             ASSESSMENT AND PLAN SYNOPSIS Severe ACUTE Hypoxic and Hypercapnic Respiratory Failuredue to pulm edema and acute COPD exacerbation-resp  Failure RESOLVED  Patient is DNR/DNI  SEVERE COPD EXACERBATION -continue IV steroids as prescribed -continue NEB THERAPY as prescribed -morphine as needed -wean fio2 as needed and tolerated  CARDIAC  AFIB WITH RVR Will need cardiology consultation   ELECTROLYTES -follow labs as needed -replace as needed -pharmacy consultation and following   DVT/GI PRX ordered and assessed TRANSFUSIONS AS NEEDED MONITOR FSBS I Assessed the need for Labs I Assessed the need for Foley I Assessed the need for Central Venous Line Family Discussion when available I Assessed the need for Mobilization I made an Assessment of medications to be adjusted accordingly Safety Risk assessment completed  CASE DISCUSSED IN MULTIDISCIPLINARY ROUNDS WITH ICU TEAM   TRANSFER TO [facility] SERVICE ICU needs resolved   Lucie Leather, M.D.  [facility]  Medical Director [facility] Medical Director [facility] Cardio-Pulmonary Department

## 2019-09-24 NOTE — Progress Notes (Signed)
[facility] [place] [facility]  ROUNDING NOTE   Subjective:  Patient doing much better today. Creatinine down to 1.1. Urine output 1.4 L over the preceding 24 hours.   Objective:  Vital signs in last 24 hours:  Temp:  [98.6 F (37 C)-99.1 F (37.3 C)] 99 F (37.2 C) (09/15 0500) Pulse Rate:  [68-155] 146 (09/15 0600) Resp:  [10-24] 13 (09/15 0600) BP: (85-166)/(52-121) 136/83 (09/15 0600) SpO2:  [88 %-100 %] 97 % (09/15 0600) FiO2 (%):  [32 %-45 %] 32 % (09/14 1900) Weight:  [95.2 kg] 95.2 kg (09/15 0419)  Weight change: 0 kg Filed Weights   09/22/19 0500 09/23/19 0330 09/24/19 0419  Weight: 95.5 kg 95.2 kg 95.2 kg    Intake/Output: I/O last 3 completed shifts: In: 4482.4 [I.V.:3721.7; IV Piggyback:760.7] Out: 2675 [Urine:2575; Emesis/NG output:100]   Intake/Output this shift:  No intake/output data recorded.  Physical Exam: General:  No acute distress  Head:  Normocephalic, oral mucosa moist  Eyes:  Anicteric  Neck:  Supple  Lungs:   Scattered rhonchi, normal effort  Heart:  S1S2 no rubs  Abdomen:   Soft, nontender, bowel sounds present  Extremities:  trace peripheral edema.  Neurologic:  Awake, alert, conversant  Skin:  No lesions  GU:  Foley catheter in place    Basic Metabolic Panel: Recent Labs  Lab 09/21/19 1526 09/21/19 1526 09/22/19 0515 09/22/19 0515 09/23/19 0330 09/23/19 1701 09/24/19 0421  NA 129*  --  136  --  140 144 142  K 7.0*  --  4.3  --  5.5* 4.4 4.1  CL 82*  --  95*  --  101 102 103  CO2 34*  --  31  --  30 30 31   GLUCOSE 142*  --  135*  --  228* 141* 148*  BUN 127*  --  69*  --  37* 26* 19  CREATININE 7.07*  --  3.13*  --  1.52* 1.12 1.13  CALCIUM 8.0*   < > 7.5*   < > 8.0* 8.1* 8.3*  MG  --   --  1.9  --  1.7 1.6* 2.0  PHOS 7.8*  --  3.8  --  3.3 2.5 2.4*   < > = values in this interval not displayed.    Liver Function Tests: Recent Labs  Lab 09/21/19 1526 09/23/19 1701  AST 24 26  ALT 18 22  ALKPHOS 79 64  BILITOT 0.9  0.6  PROT 6.1* 5.2*  ALBUMIN 3.1* 2.6*   No results for input(s): LIPASE, AMYLASE in the last 168 hours. No results for input(s): AMMONIA in the last 168 hours.  CBC: Recent Labs  Lab 09/21/19 1526 09/22/19 0515  WBC 11.7* 10.0  NEUTROABS 9.6*  --   HGB 11.5* 11.1*  HCT 35.5* 34.8*  MCV 86.0 87.2  PLT 328 353    Cardiac Enzymes: Recent Labs  Lab 09/21/19 1526  CKTOTAL 289    BNP: Invalid input(s): POCBNP  CBG: Recent Labs  Lab 09/23/19 1542 09/23/19 1931 09/24/19 0011 09/24/19 0406 09/24/19 0722  GLUCAP 138* 150* 192* 151* 144*    Microbiology: Results for orders placed or performed during the hospital encounter of 09/21/19  Blood Culture (routine x 2)     Status: None (Preliminary result)   Collection Time: 09/21/19  3:25 PM   Specimen: BLOOD  Result Value Ref Range Status   Specimen Description BLOOD LEFT ARM  Final   Special Requests   Final    BOTTLES  DRAWN AEROBIC AND ANAEROBIC Blood Culture adequate volume   Culture   Final    NO GROWTH 3 DAYS Performed at [facility], [address] [place], [place] [address]    Report Status PENDING  Incomplete  Blood Culture (routine x 2)     Status: Abnormal (Preliminary result)   Collection Time: 09/21/19  3:25 PM   Specimen: BLOOD  Result Value Ref Range Status   Specimen Description   Final    BLOOD RIGHT ARM Performed at [facility], [address], [place], [place] [address]    Special Requests   Final    BOTTLES DRAWN AEROBIC AND ANAEROBIC Blood Culture adequate volume Performed at [facility], [address] [place], [place] [address]    Culture  Setup Time   Final    Organism ID to follow GRAM POSITIVE COCCI AEROBIC BOTTLE ONLY CRITICAL RESULT CALLED TO, READ BACK BY AND VERIFIED WITH: ABBY ELLINGTON AT 1029 09/22/19 SDR GRAM STAIN REVIEWED-AGREE WITH RESULT Performed at [facility], [place] [address] [place], [place] [address]    Culture  STAPHYLOCOCCUS EPIDERMIDIS (A)  Final   Report Status PENDING  Incomplete  Urine culture     Status: None   Collection Time: 09/21/19  3:25 PM   Specimen: In/Out Cath Urine  Result Value Ref Range Status   Specimen Description   Final    IN/OUT CATH URINE Performed at [facility], [address], [place], [place] [address]    Special Requests   Final    NONE Performed at [facility], [address], [place], [place] [address]    Culture   Final    NO GROWTH Performed at [facility], [place] [address], [place],  [address]    Report Status 09/22/2019 FINAL  Final  Blood Culture ID Panel (Reflexed)     Status: Abnormal   Collection Time: 09/21/19  3:25 PM  Result Value Ref Range Status   Enterococcus faecalis NOT DETECTED NOT DETECTED Final   Enterococcus Faecium NOT DETECTED NOT DETECTED Final   Listeria monocytogenes NOT DETECTED NOT DETECTED Final   Staphylococcus species DETECTED (A) NOT DETECTED Final    Comment: CRITICAL RESULT CALLED TO, READ BACK BY AND VERIFIED WITH:  ABBY ELLINGTON AT 1029 09/22/19 SDR    Staphylococcus aureus (BCID) NOT DETECTED NOT DETECTED Final   Staphylococcus epidermidis DETECTED (A) NOT DETECTED Final    Comment: Methicillin (oxacillin) resistant coagulase negative staphylococcus. Possible blood culture contaminant (unless isolated from more than one blood culture draw or clinical case suggests pathogenicity). No antibiotic treatment is indicated for blood  culture contaminants. CRITICAL RESULT CALLED TO, READ BACK BY AND VERIFIED WITH: ABBY ELLINGTON AT 1324 09/22/19 SDR    Staphylococcus lugdunensis NOT DETECTED NOT DETECTED Final   Streptococcus species NOT DETECTED NOT DETECTED Final   Streptococcus agalactiae NOT DETECTED NOT DETECTED Final   Streptococcus pneumoniae NOT DETECTED NOT DETECTED Final   Streptococcus pyogenes NOT DETECTED NOT DETECTED Final   A.calcoaceticus-baumannii NOT DETECTED NOT DETECTED  Final   Bacteroides fragilis NOT DETECTED NOT DETECTED Final   Enterobacterales NOT DETECTED NOT DETECTED Final   Enterobacter cloacae complex NOT DETECTED NOT DETECTED Final   Escherichia coli NOT DETECTED NOT DETECTED Final   Klebsiella aerogenes NOT DETECTED NOT DETECTED Final   Klebsiella oxytoca NOT DETECTED NOT DETECTED Final   Klebsiella pneumoniae NOT DETECTED NOT DETECTED Final   Proteus species NOT DETECTED NOT DETECTED Final  Salmonella species NOT DETECTED NOT DETECTED Final   Serratia marcescens NOT DETECTED NOT DETECTED Final   Haemophilus influenzae NOT DETECTED NOT DETECTED Final   Neisseria meningitidis NOT DETECTED NOT DETECTED Final   Pseudomonas aeruginosa NOT DETECTED NOT DETECTED Final   Stenotrophomonas maltophilia NOT DETECTED NOT DETECTED Final   Candida albicans NOT DETECTED NOT DETECTED Final   Candida auris NOT DETECTED NOT DETECTED Final   Candida glabrata NOT DETECTED NOT DETECTED Final   Candida krusei NOT DETECTED NOT DETECTED Final   Candida parapsilosis NOT DETECTED NOT DETECTED Final   Candida tropicalis NOT DETECTED NOT DETECTED Final   Cryptococcus neoformans/gattii NOT DETECTED NOT DETECTED Final   Methicillin resistance mecA/C DETECTED (A) NOT DETECTED Final    Comment: CRITICAL RESULT CALLED TO, READ BACK BY AND VERIFIED WITH:  ABBY ELLINGTON AT 1029 09/22/19 SDR Performed at [facility], [address] [place], [place] [address]   SARS Coronavirus 2 by RT PCR (hospital order, performed in [place] hospital lab) Nasopharyngeal Nasopharyngeal Swab     Status: None   Collection Time: 09/21/19  3:28 PM   Specimen: Nasopharyngeal Swab  Result Value Ref Range Status   SARS Coronavirus 2 NEGATIVE NEGATIVE Final    Comment: (NOTE) SARS-CoV-2 target nucleic acids are NOT DETECTED.  The SARS-CoV-2 RNA is generally detectable in upper and lower respiratory specimens during the acute phase of infection. The lowest concentration of  SARS-CoV-2 viral copies this assay can detect is 250 copies / mL. A negative result does not preclude SARS-CoV-2 infection and should not be used as the sole basis for treatment or other patient management decisions.  A negative result may occur with improper specimen collection / handling, submission of specimen other than nasopharyngeal swab, presence of viral mutation(s) within the areas targeted by this assay, and inadequate number of viral copies (<250 copies / mL). A negative result must be combined with clinical observations, patient history, and epidemiological information.  Fact Sheet for Patients:   StrictlyIdeas.no  Fact Sheet for Healthcare Providers: BankingDealers.co.za  This test is not yet approved or  cleared by the [place] FDA and has been authorized for detection and/or diagnosis of SARS-CoV-2 by FDA under an Emergency Use Authorization (EUA).  This EUA will remain in effect (meaning this test can be used) for the duration of the COVID-19 declaration under Section 564(b)(1) of the Act, 21 U.S.C. section 360bbb-3(b)(1), unless the authorization is terminated or revoked sooner.  Performed at [facility], [address] [place], [place] [address]   MRSA PCR Screening     Status: None   Collection Time: 09/21/19 10:16 PM   Specimen: Nasal Mucosa; Nasopharyngeal  Result Value Ref Range Status   MRSA by PCR NEGATIVE NEGATIVE Final    Comment:        The GeneXpert MRSA Assay (FDA approved for NASAL specimens only), is one component of a comprehensive MRSA colonization surveillance program. It is not intended to diagnose MRSA infection nor to guide or monitor treatment for MRSA infections. Performed at [facility], [address] [place], [place] [address]     Coagulation Studies: Recent Labs    09/21/19 1526  LABPROT 15.4*  INR 1.3*    Urinalysis: Recent Labs     09/21/19 1525  COLORURINE YELLOW*  LABSPEC 1.016  PHURINE 5.0  GLUCOSEU NEGATIVE  HGBUR NEGATIVE  BILIRUBINUR NEGATIVE  KETONESUR NEGATIVE  PROTEINUR NEGATIVE  NITRITE NEGATIVE  LEUKOCYTESUR NEGATIVE      Imaging:  US RENAL  Result Date: 09/22/2019 CLINICAL DATA:  Acute renal failure EXAM: RENAL / URINARY TRACT ULTRASOUND COMPLETE COMPARISON:  None. FINDINGS: Right Kidney: Renal measurements: 10.9 x 5.7 x 5.0 cm = volume: 163 mL. Echogenic renal parenchyma. No mass or hydronephrosis visualized. Left Kidney: Renal measurements: 11.0 x 5.3 x 6.0 cm = volume: 180 mL. Echogenic renal parenchyma. No mass or hydronephrosis visualized. Bladder: Decompressed by indwelling Foley catheter Other: None. IMPRESSION: Echogenic renal parenchyma, suggesting medical renal disease. No hydronephrosis. Bladder decompressed by indwelling Foley catheter. Electronically Signed   By: Julian Hy M.D.   On: 09/22/2019 09:36     Medications:   . sodium chloride Stopped (09/21/19 1624)  . sodium chloride 75 mL/hr at 09/24/19 0600  . sodium chloride    . sodium chloride    . azithromycin Stopped (09/23/19 1841)  . cefTRIAXone (ROCEPHIN)  IV Stopped (09/23/19 1611)  . diltiazem (CARDIZEM) infusion 7.5 mg/hr (09/24/19 0600)  . fentaNYL infusion INTRAVENOUS Stopped (09/23/19 1008)  . norepinephrine (LEVOPHED) Adult infusion Stopped (09/23/19 1305)  . propofol (DIPRIVAN) infusion Stopped (09/23/19 0923)  . vasopressin Stopped (09/23/19 1111)   . chlorhexidine gluconate (MEDLINE KIT)  15 mL Mouth Rinse BID  . Chlorhexidine Gluconate Cloth  6 each Topical Q0600  . heparin  5,000 Units Subcutaneous Q8H  . hydrocortisone sod succinate (SOLU-CORTEF) inj  50 mg Intravenous Q6H  . insulin aspart  1-3 Units Subcutaneous Q4H  . ipratropium-albuterol  3 mL Nebulization Q6H  . metoprolol tartrate  25 mg Oral BID  . pantoprazole (PROTONIX) IV  40 mg Intravenous QHS  . patiromer  8.4 g Oral Daily  . pregabalin   200 mg Oral TID   sodium chloride, sodium chloride, alteplase, docusate, heparin, lidocaine (PF), lidocaine-prilocaine, melatonin, midazolam, morphine injection, ondansetron (ZOFRAN) IV, pentafluoroprop-tetrafluoroeth, polyethylene glycol  Assessment/ Plan:  64 y.o. male with a PMHx of coronary artery disease status post PTCA and stent x2, COPD, degenerative disc disease in the cervical spine, depression, diabetes mellitus type 2, GERD, hypertension, peripheral neuropathy, nutcracker esophagus, who was admitted to [facility] on 09/21/2019 for evaluation of unresponsiveness.  1.  Acute kidney injury.  Baseline creatinine 1.0 with an EGFR greater than 60.  Suspect acute kidney injury related to hypotension.   -Patient doing quite well.  Creatinine down to 1.1 with good urine output of 1.4 L.  Discontinue temporary dialysis catheter.   2.  Hyperkalemia.  Potassium down to 4.1.  Stop Veltassa now.  3.  Acute respiratory failure.  Patient now extubated.  Breathing comfortably.   LOS: 3 Gregory Hall 9/15/20218:21 AM

## 2019-09-24 NOTE — Progress Notes (Signed)
PHARMACY CONSULT NOTE - FOLLOW UP  Pharmacy Consult for Electrolyte Monitoring and Replacement   Recent Labs: Potassium (mmol/L)  Date Value  09/24/2019 4.1  03/31/2013 3.6   Magnesium (mg/dL)  Date Value  95/63/8756 2.0  06/25/2012 2.4   Calcium (mg/dL)  Date Value  43/32/9518 8.3 (L)   Calcium, Total (mg/dL)  Date Value  84/16/6063 7.6 (L)   Albumin (g/dL)  Date Value  01/60/1093 2.6 (L)  12/07/2014 3.6  03/30/2013 3.2 (L)   Phosphorus (mg/dL)  Date Value  23/55/7322 2.4 (L)  03/20/2012 3.4   Sodium (mmol/L)  Date Value  09/24/2019 142  03/31/2013 142  Corrected Calcium: 9.2   Assessment: Patient is a 64yo male admitted with severe AKI, hyperkalemia (K = 7.0) requiring emergent HD. Pharmacy consulted to assist with management of electrolytes.  9/12 K 7.0, emergent HD completed 9/13 K 4.3. Cr improved from 7.07 >> 3.13. UOP of 2.8 L yesterday. Plan for dialysis as needed.  9/14 K 5.5, other electrolytes wnl. Cr improving 3.13 >> 1.52. Good UOP. No plan for dialysis today. Mag low overnight, 2g IV x1 given 9/15 K 4.1 on daily patiromer. Phos slightly low at 2.4. Pt extubated yesterday and is on 3L [place] and clear liquid diet with the plan to advance the diet today. Scr  1.52 >> 1.12/1.13 with good UOP.  Plan:  - Discontinue patiromer (Veltassa) 8.4 g packet daily per nephrology - No additional replacement needed at this time. - Recheck electrolytes with AM labs.  Levada Schilling PharmD Candidate 2022 09/24/2019 9:14 AM

## 2019-09-24 NOTE — Progress Notes (Signed)
Nutrition Follow-up  DOCUMENTATION CODES:   Not applicable  INTERVENTION:  Provide Nepro Shake po BID, each supplement provides 425 kcal and 19 grams protein.  NUTRITION DIAGNOSIS:   Inadequate oral intake related to decreased appetite as evidenced by per patient/family report.  New nutrition diagnosis.  GOAL:   Patient will meet greater than or equal to 90% of their needs  Progressing with diet advancement.  MONITOR:   PO intake, Supplement acceptance, Labs, Weight trends, I & O's  REASON FOR ASSESSMENT:   Ventilator    ASSESSMENT:   64 y.o. male with a PMHx of coronary artery disease status post PTCA and stent x2, COPD, degenerative disc disease in the cervical spine, depression, diabetes mellitus type 2, GERD, hypertension, peripheral neuropathy and nutcracker esophagus who was admitted to [facility] on 09/21/2019 for evaluation of unresponsiveness and was found to have severe AKI requiring emergent HD 9/12  9/14 extubated  Met with patient at bedside this morning. He was on clear liquids at that time. Patient reported he was tolerating liquids at that time. He does report his appetite is decreased today. He reports he had a good appetite and intake PTA. Patient is amenable to drinking oral nutrition supplements to help meet calorie/protein needs. Diet has now been advanced to heart healthy/carbohydrate modified. Patient had hyperkalemia but is now improving.  Medications reviewed and include: Novolog 1-3 units Q4hrs, nicotine patch, Protonix.  Labs reviewed: CBG 144-197.  I/O: 1425 mL UOP yesterday (0.6 mL/kg/hr)  Weight trend: 95.2 kg on 9/15; -0.6 kg from 9/12  Diet Order:   Diet Order            Diet heart healthy/carb modified Room service appropriate? Yes; Fluid consistency: Thin  Diet effective now                EDUCATION NEEDS:   No education needs have been identified at this time  Skin:  Skin Assessment: Reviewed RN Assessment  Last BM:   Unknown/PTA  Height:   Ht Readings from Last 1 Encounters:  09/23/19 6' 2.02" (1.88 m)   Weight:   Wt Readings from Last 1 Encounters:  09/24/19 95.2 kg   Ideal Body Weight:  86.3 kg  BMI:  Body mass index is 26.94 kg/m.  Estimated Nutritional Needs:   Kcal:  2150-2350  Protein:  110-120 grams  Fluid:  2.2-2.5L/day  Jacklynn Barnacle, MS, RD, LDN Pager number available on Amion

## 2019-09-25 ENCOUNTER — Inpatient Hospital Stay: Admit: 2019-09-25 | Payer: Medicare Other

## 2019-09-25 ENCOUNTER — Inpatient Hospital Stay: Payer: Medicare Other

## 2019-09-25 DIAGNOSIS — J432 Centrilobular emphysema: Secondary | ICD-10-CM

## 2019-09-25 DIAGNOSIS — Z794 Long term (current) use of insulin: Secondary | ICD-10-CM

## 2019-09-25 DIAGNOSIS — R0603 Acute respiratory distress: Secondary | ICD-10-CM

## 2019-09-25 DIAGNOSIS — R0602 Shortness of breath: Secondary | ICD-10-CM

## 2019-09-25 DIAGNOSIS — E119 Type 2 diabetes mellitus without complications: Secondary | ICD-10-CM

## 2019-09-25 LAB — GLUCOSE, CAPILLARY
Glucose-Capillary: 118 mg/dL — ABNORMAL HIGH (ref 70–99)
Glucose-Capillary: 131 mg/dL — ABNORMAL HIGH (ref 70–99)
Glucose-Capillary: 132 mg/dL — ABNORMAL HIGH (ref 70–99)
Glucose-Capillary: 141 mg/dL — ABNORMAL HIGH (ref 70–99)
Glucose-Capillary: 162 mg/dL — ABNORMAL HIGH (ref 70–99)
Glucose-Capillary: 233 mg/dL — ABNORMAL HIGH (ref 70–99)

## 2019-09-25 LAB — LIPID PANEL
Cholesterol: 97 mg/dL (ref 0–200)
HDL: 28 mg/dL — ABNORMAL LOW (ref >40)
LDL Cholesterol: 46 mg/dL (ref 0–99)
Total CHOL/HDL Ratio: 3.5 RATIO
Triglycerides: 116 mg/dL (ref <150)
VLDL: 23 mg/dL (ref 0–40)

## 2019-09-25 LAB — BASIC METABOLIC PANEL
Anion gap: 8 (ref 5–15)
BUN: 11 mg/dL (ref 8–23)
CO2: 30 mmol/L (ref 22–32)
Calcium: 8.2 mg/dL — ABNORMAL LOW (ref 8.9–10.3)
Chloride: 103 mmol/L (ref 98–111)
Creatinine, Ser: 0.92 mg/dL (ref 0.61–1.24)
GFR calc Af Amer: >60 mL/min (ref >60)
GFR calc non Af Amer: >60 mL/min (ref >60)
Glucose, Bld: 121 mg/dL — ABNORMAL HIGH (ref 70–99)
Potassium: 4 mmol/L (ref 3.5–5.1)
Sodium: 141 mmol/L (ref 135–145)

## 2019-09-25 LAB — PHOSPHORUS: Phosphorus: 3.3 mg/dL (ref 2.5–4.6)

## 2019-09-25 MED ORDER — MIDODRINE HCL 5 MG PO TABS
10.0000 mg | ORAL_TABLET | Freq: Four times a day (QID) | ORAL | Status: DC
  Filled 2019-09-25: qty 2

## 2019-09-25 MED ORDER — FUROSEMIDE 10 MG/ML IJ SOLN
40.0000 mg | Freq: Two times a day (BID) | INTRAMUSCULAR | Status: DC
  Administered 2019-09-25: 40 mg via INTRAVENOUS
  Filled 2019-09-25 (×2): qty 4

## 2019-09-25 MED ORDER — AMIODARONE HCL IN DEXTROSE 360-4.14 MG/200ML-% IV SOLN
30.0000 mg/h | INTRAVENOUS | Status: DC
  Administered 2019-09-26 – 2019-09-27 (×4): 30 mg/h via INTRAVENOUS
  Filled 2019-09-25 (×3): qty 200

## 2019-09-25 MED ORDER — DIGOXIN 0.25 MG/ML IJ SOLN
0.2500 mg | Freq: Once | INTRAMUSCULAR | Status: AC
  Administered 2019-09-25: 0.25 mg via INTRAVENOUS
  Filled 2019-09-25: qty 2

## 2019-09-25 MED ORDER — INSULIN ASPART 100 UNIT/ML ~~LOC~~ SOLN
0.0000 [IU] | Freq: Every day | SUBCUTANEOUS | Status: DC
  Administered 2019-09-25 – 2019-09-27 (×2): 2 [IU] via SUBCUTANEOUS
  Filled 2019-09-25 (×3): qty 1

## 2019-09-25 MED ORDER — AMIODARONE HCL IN DEXTROSE 360-4.14 MG/200ML-% IV SOLN
60.0000 mg/h | INTRAVENOUS | Status: DC
  Administered 2019-09-25 (×2): 60 mg/h via INTRAVENOUS
  Filled 2019-09-25 (×2): qty 200

## 2019-09-25 MED ORDER — INSULIN ASPART 100 UNIT/ML ~~LOC~~ SOLN
0.0000 [IU] | Freq: Three times a day (TID) | SUBCUTANEOUS | Status: DC
  Administered 2019-09-25 (×2): 1 [IU] via SUBCUTANEOUS
  Administered 2019-09-25 – 2019-09-26 (×2): 2 [IU] via SUBCUTANEOUS
  Administered 2019-09-26 – 2019-09-27 (×2): 3 [IU] via SUBCUTANEOUS
  Administered 2019-09-27 – 2019-09-28 (×3): 2 [IU] via SUBCUTANEOUS
  Filled 2019-09-25 (×9): qty 1

## 2019-09-25 MED ORDER — SODIUM CHLORIDE 0.9% FLUSH: 3.0000 mL | INTRAVENOUS | Status: DC | PRN

## 2019-09-25 MED ORDER — AMIODARONE LOAD VIA INFUSION
150.0000 mg | Freq: Once | INTRAVENOUS | Status: AC
  Administered 2019-09-25: 150 mg via INTRAVENOUS
  Filled 2019-09-25: qty 83.34

## 2019-09-25 MED ORDER — ATORVASTATIN CALCIUM 20 MG PO TABS
40.0000 mg | ORAL_TABLET | Freq: Every day | ORAL | Status: DC
  Administered 2019-09-25 – 2019-09-28 (×4): 40 mg via ORAL
  Filled 2019-09-25 (×4): qty 2

## 2019-09-25 MED ORDER — MIDODRINE HCL 5 MG PO TABS
10.0000 mg | ORAL_TABLET | Freq: Three times a day (TID) | ORAL | Status: DC
  Administered 2019-09-25 – 2019-09-28 (×9): 10 mg via ORAL
  Filled 2019-09-25 (×9): qty 2

## 2019-09-25 NOTE — Hospital Course (Addendum)
Gregory Hall is a 64 yo CM with PMH COPD, HTN, GERD, HLD, depression/anxiety, CAD, DMII who presented to the hospital initially on 09/22/19 after being found unresponsive at a group home.  He is chronically on 3 L oxygen and was found to be hypoxic and hypotensive.  He resides at a group home.  He was intubated upon arrival to the hospital and initially required vasopressor support. Etiology of his respiratory failure was considered due to severe COPD exacerbation.  He was also evaluated by cardiology for new onset a flutter with RVR and started on a Cardizem infusion. He also developed renal failure with etiology considered due to his hypotension and possibly even his hypoxia on admission.  He required temporary dialysis and renal function slowly improved.  He was transferred out of the ICU on 09/24/2019.  He continued to have ongoing uncontrolled ventricular rates up to 130.  Cardiology continue to follow.  His blood pressure remained soft/hypotensive precluding further up titration of Cardizem.  He had also briefly been on amiodarone while in the ER.  Due to ongoing tachycardia refractory to treatment with ensuing hypotension, he underwent cardioversion with cardiology on 09/26/2019.  He was converted to NSR.  He is continued on amiodarone with addition of bisoprolol for rate control. He remained stable and in normal sinus rhythm after cardioversion.  He was continued on oral amiodarone at discharge and will complete total of 1 week of amiodarone 400 mg twice daily then decrease down to 200 mg twice daily thereafter.  He was started on bisoprolol and at discharge continued on bisoprolol 2.5 mg daily.  He was already on Xarelto which was also continued. He will follow-up with cardiology outpatient. He remained overall improved and considered stable for discharging back to his group home which he was amenable with.  Of note, he had met with Palliative care after transfer out of the ICU. After discussion, he  decided to change his code status to DNR and the form was filled out and signed prior to discharge.

## 2019-09-25 NOTE — Assessment & Plan Note (Signed)
-  Prior LAD stent in 2010 -Elevated troponin on admission considered in setting of demand ischemia especially with significant tachycardia -No further work-up indicated per cardiology

## 2019-09-25 NOTE — Assessment & Plan Note (Addendum)
-   with rapid rate -Has been trialed on amiodarone and Cardizem.  Had been hypotensive and unable to continue with Cardizem.  Amiodarone reinitiated per cardiology on 09/25/2019.  Digoxin also given on 09/25/2019 per cardiology. -Due to ongoing rapid rate, patient was successfully cardioverted by cardiology on 09/26/2019.  He is now in NSR.  - amio gtt changed to PO 400mg  BID x 1 week then 200mg  BID after -Continue bisoprolol per cardiology - midodrine initiated for hypotension; continue and monitor response.  If blood pressure becomes elevated outpatient, may need to discontinue his midodrine. - greatly appreciate cardiology assistance in the management of this complex patient

## 2019-09-25 NOTE — Assessment & Plan Note (Addendum)
-   on 3L [place] O2 at group home - s/p intubation and now has been extubated and tx'd out of ICU - continue O2 - continue breathing treatments

## 2019-09-25 NOTE — Progress Notes (Signed)
Pt continues to be in afib with RVR despite IV dig, Dr. Mariah Milling notified, per MD will add another dose of IV dig.   1738: Pt HR still in the 120's-140's. Per Dr. Mariah Milling will start pt on amio drip. Per Dr. Mariah Milling give bolus over 40 mins. Will start amio gtt and continue to monitor.

## 2019-09-25 NOTE — Progress Notes (Signed)
PHARMACY CONSULT NOTE - FOLLOW UP  Pharmacy Consult for Electrolyte Monitoring and Replacement   Recent Labs: Potassium (mmol/L)  Date Value  09/25/2019 4.0  03/31/2013 3.6   Magnesium (mg/dL)  Date Value  34/19/6222 2.0  06/25/2012 2.4   Calcium (mg/dL)  Date Value  97/98/9211 8.2 (L)   Calcium, Total (mg/dL)  Date Value  94/17/4081 7.6 (L)   Albumin (g/dL)  Date Value  44/81/8563 2.6 (L)  12/07/2014 3.6  03/30/2013 3.2 (L)   Phosphorus (mg/dL)  Date Value  14/97/0263 3.3  03/20/2012 3.4   Sodium (mmol/L)  Date Value  09/25/2019 141  03/31/2013 142  Corrected Calcium: 9.6   Assessment: Patient is a 64yo male admitted with severe AKI, hyperkalemia (K = 7.0) requiring emergent HD. Pharmacy consulted to assist with management of electrolytes.  9/12 K 7.0, emergent HD completed 9/13 K 4.3. Cr improved from 7.07 >> 3.13. UOP of 2.8 L yesterday. Plan for dialysis as needed.  9/14 K 5.5, other electrolytes wnl. Cr improving 3.13 >> 1.52. Good UOP. No plan for dialysis today. Mag low overnight, 2g IV x1 given 9/15 K 4.1 on daily patiromer. Phos slightly low at 2.4. Pt extubated yesterday and is on 3L [place] and clear liquid diet with the plan to advance the diet today.  9/16 Patiromer d/c'd per nephrology yesterday. Electrolytes are WNL today Scr  1.52 >>1.13>>0.92 with good UOP.  Plan:  - No additional replacement needed at this time. - Recheck electrolytes with AM labs.  Raiford Noble, PharmD Pharmacy Resident  09/25/2019 8:00 AM

## 2019-09-25 NOTE — Assessment & Plan Note (Addendum)
-  Overall he has improved from initial presentation.  Palliative care has been consulted and is following, appreciate assistance -CODE STATUS is DNR at this time; he has told palliative care he does not want to be back on a ventilator

## 2019-09-25 NOTE — Progress Notes (Signed)
PROGRESS NOTE    Gregory Hall   UEA:[id number]  DOB: Apr 21, 1955  DOA: 09/21/2019     4  PCP: Remi Haggard, FNP  CC: found unresponsive  Hospital Course: [name] is a 64 yo CM with PMH COPD, HTN, GERD, HLD, depression/anxiety, CAD, DMII who presented to the hospital initially on 09/22/19 after being found unresponsive at a group home.  He is chronically on 3 L oxygen and was found to be hypoxic and hypotensive.  He resides at a group home.  He was intubated upon arrival to the hospital and initially required vasopressor support. Etiology of his respiratory failure was considered due to severe COPD exacerbation.  He was also evaluated by cardiology for new onset a flutter with RVR and started on a Cardizem infusion. He also developed renal failure with etiology considered due to his hypotension and possibly even his hypoxia on admission.  He required temporary dialysis and renal function slowly improved.  He was transferred out of the ICU on 09/24/2019.  He continued to have ongoing uncontrolled ventricular rates up to 130.  Cardiology continue to follow.  His blood pressure remained soft/hypotensive precluding further up titration of Cardizem.  He had also briefly been on amiodarone while in the ER.  He may still undergo inpatient cardioversion if ongoing uncontrolled rate with hypotension.   Interval History:  Patient seen this morning short of breath and anxious.  He was in a flutter with RVR with rate 110-130.  Stat CXR was obtained which was negative for obvious overload however he is edematous on exam.  With rate control strategies, his anxiety/shortness of breath improved as well as his heart rate with oral dose of Lopressor this morning.  He did have recurrent RVR later in the day when seen by cardiology and there are tentative plans for possible cardioversion in the morning due to ongoing hypotension/hemodynamic instability.  Old records reviewed in assessment of this  patient  ROS: Constitutional: negative for chills and fevers, Respiratory: positive for Shortness of breath, Cardiovascular: negative for chest pain and Gastrointestinal: negative for abdominal pain  Assessment & Plan: Acute metabolic encephalopathy - now resolved; considered multifactorial notably due to respiratory failure and renal failure   Acute on chronic respiratory failure with hypoxia (HCC) - on 3L [place] O2 at group home - s/p intubation and now has been extubated and tx'd out of ICU - continue O2 - continue breathing treatments  Atrial flutter ([place]) - with rapid rate -Cardiology following, appreciate assistance -Has been trialed on amiodarone and Cardizem.  Now is hypotensive and unable to continue with Cardizem.  Amiodarone has been reinitiated per cardiology.  Digoxin also given on 09/25/2019 per cardiology.  If remains hypotensive tentative plan will be for possible cardioversion -Continue targeting rate control; continue amiodarone  Goals of care, counseling/discussion -Overall he has improved from initial presentation.  Palliative care has been consulted and is following, appreciate assistance -CODE STATUS is DNR at this time; he has told palliative care he does not want to be back on a ventilator  Hyperkalemia -Now resolved.  Has responded to treatment  Acute diastolic CHF (congestive heart failure) ([place]) -Repeat echo pending to reevaluate in setting of a flutter -Cardiology following, appreciate assistance -Previous echo from 09/14/2019 revealed EF 65 to 70% with grade 2 diastolic dysfunction -On exam, he is edematous and appears volume overloaded  CAD (coronary artery disease) -Prior LAD stent in 2010 -Elevated troponin on admission considered in setting of demand ischemia especially with significant  tachycardia -No further work-up indicated per cardiology   Antimicrobials: None  DVT prophylaxis: Xarelto Code Status: DNR Family Communication: None  present Disposition Plan: Status is: Inpatient  Remains inpatient appropriate because:Hemodynamically unstable, Unsafe d/c plan, IV treatments appropriate due to intensity of illness or inability to take PO and Inpatient level of care appropriate due to severity of illness   Dispo: The patient is from: Group home              Anticipated d/c is to: Group home              Anticipated d/c date is: 3 days              Patient currently is not medically stable to d/c.       Objective: Blood pressure (!) 120/94, pulse (!) 115, temperature 99.1 F (37.3 C), temperature source Oral, resp. rate 14, height 6' (1.829 m), weight 97.6 kg, SpO2 97 %.  Examination: General appearance: Pleasant appearing slightly elderly man resting in bed appearing anxious and uncomfortable but in no obvious distress Head: Normocephalic, without obvious abnormality, atraumatic Eyes: EOMI Lungs: Coarse breath sounds bilaterally with some scattered expiratory wheezing Heart: irregularly irregular rhythm and S1, S2 normal Abdomen: normal findings: bowel sounds normal and soft, non-tender Extremities: 2+ bilateral lower extremity pitting edema as well as some edema appreciated in upper extremities Skin: Scattered bruising Neurologic: Grossly normal  Consultants:   Cardiology  Nephrology  Data Reviewed: I have personally reviewed following labs and imaging studies Results for orders placed or performed during the hospital encounter of 09/21/19 (from the past 24 hour(s))  Glucose, capillary     Status: Abnormal   Collection Time: 09/24/19  7:48 PM  Result Value Ref Range   Glucose-Capillary 200 (H) 70 - 99 mg/dL  Glucose, capillary     Status: Abnormal   Collection Time: 09/25/19 12:24 AM  Result Value Ref Range   Glucose-Capillary 131 (H) 70 - 99 mg/dL  Glucose, capillary     Status: Abnormal   Collection Time: 09/25/19  3:58 AM  Result Value Ref Range   Glucose-Capillary 118 (H) 70 - 99 mg/dL  Basic  metabolic panel     Status: Abnormal   Collection Time: 09/25/19  5:45 AM  Result Value Ref Range   Sodium 141 135 - 145 mmol/L   Potassium 4.0 3.5 - 5.1 mmol/L   Chloride 103 98 - 111 mmol/L   CO2 30 22 - 32 mmol/L   Glucose, Bld 121 (H) 70 - 99 mg/dL   BUN 11 8 - 23 mg/dL   Creatinine, Ser 0.92 0.61 - 1.24 mg/dL   Calcium 8.2 (L) 8.9 - 10.3 mg/dL   GFR calc non Af Amer >60 >60 mL/min   GFR calc Af Amer >60 >60 mL/min   Anion gap 8 5 - 15  Phosphorus     Status: None   Collection Time: 09/25/19  5:45 AM  Result Value Ref Range   Phosphorus 3.3 2.5 - 4.6 mg/dL  Lipid panel     Status: Abnormal   Collection Time: 09/25/19  5:45 AM  Result Value Ref Range   Cholesterol 97 0 - 200 mg/dL   Triglycerides 116 <150 mg/dL   HDL 28 (L) >40 mg/dL   Total CHOL/HDL Ratio 3.5 RATIO   VLDL 23 0 - 40 mg/dL   LDL Cholesterol 46 0 - 99 mg/dL  Glucose, capillary     Status: Abnormal   Collection Time: 09/25/19  8:05 AM  Result Value Ref Range   Glucose-Capillary 141 (H) 70 - 99 mg/dL  Glucose, capillary     Status: Abnormal   Collection Time: 09/25/19 11:57 AM  Result Value Ref Range   Glucose-Capillary 162 (H) 70 - 99 mg/dL  Glucose, capillary     Status: Abnormal   Collection Time: 09/25/19  4:08 PM  Result Value Ref Range   Glucose-Capillary 132 (H) 70 - 99 mg/dL    Recent Results (from the past 240 hour(s))  Blood Culture (routine x 2)     Status: None (Preliminary result)   Collection Time: 09/21/19  3:25 PM   Specimen: BLOOD  Result Value Ref Range Status   Specimen Description BLOOD LEFT ARM  Final   Special Requests   Final    BOTTLES DRAWN AEROBIC AND ANAEROBIC Blood Culture adequate volume   Culture   Final    NO GROWTH 4 DAYS Performed at [facility], [address], [place], [place] [address]    Report Status PENDING  Incomplete  Blood Culture (routine x 2)     Status: Abnormal   Collection Time: 09/21/19  3:25 PM   Specimen: BLOOD  Result Value Ref Range  Status   Specimen Description   Final    BLOOD RIGHT ARM Performed at [facility], [address], [place], [place] [address]    Special Requests   Final    BOTTLES DRAWN AEROBIC AND ANAEROBIC Blood Culture adequate volume Performed at [facility], [address] [place], [place] [address]    Culture  Setup Time   Final    Organism ID to follow [place] TO, READ BACK BY AND VERIFIED WITH: ABBY ELLINGTON AT 1029 09/22/19 SDR GRAM STAIN REVIEWED-AGREE WITH RESULT    Culture (A)  Final    STAPHYLOCOCCUS EPIDERMIDIS THE SIGNIFICANCE OF ISOLATING THIS ORGANISM FROM A SINGLE SET OF BLOOD CULTURES WHEN MULTIPLE SETS ARE DRAWN IS UNCERTAIN. PLEASE NOTIFY THE MICROBIOLOGY DEPARTMENT WITHIN ONE WEEK IF SPECIATION AND SENSITIVITIES ARE REQUIRED. Performed at [facility], [place] [address], [place], [place] [address]    Report Status 09/24/2019 FINAL  Final  Urine culture     Status: None   Collection Time: 09/21/19  3:25 PM   Specimen: In/Out Cath Urine  Result Value Ref Range Status   Specimen Description   Final    IN/OUT CATH URINE Performed at [facility], [address], [place], [place] [address]    Special Requests   Final    NONE Performed at [facility], [address], [place], [place] [address]    Culture   Final    NO GROWTH Performed at [facility], [place] [address], [place], [place] [address]    Report Status 09/22/2019 FINAL  Final  Blood Culture ID Panel (Reflexed)     Status: Abnormal   Collection Time: 09/21/19  3:25 PM  Result Value Ref Range Status   Enterococcus faecalis NOT DETECTED NOT DETECTED Final   Enterococcus Faecium NOT DETECTED NOT DETECTED Final   Listeria monocytogenes NOT DETECTED NOT DETECTED Final   Staphylococcus species DETECTED (A) NOT DETECTED Final    Comment: CRITICAL RESULT CALLED TO, READ BACK BY AND VERIFIED WITH:  ABBY ELLINGTON  AT 1029 09/22/19 SDR    Staphylococcus aureus (BCID) NOT DETECTED NOT DETECTED Final   Staphylococcus epidermidis DETECTED (A) NOT DETECTED Final    Comment:  Methicillin (oxacillin) resistant coagulase negative staphylococcus. Possible blood culture contaminant (unless isolated from more than one blood culture draw or clinical case suggests pathogenicity). No antibiotic treatment is indicated for blood  culture contaminants. CRITICAL RESULT CALLED TO, READ BACK BY AND VERIFIED WITH: ABBY ELLINGTON AT 7517 09/22/19 SDR    Staphylococcus lugdunensis NOT DETECTED NOT DETECTED Final   Streptococcus species NOT DETECTED NOT DETECTED Final   Streptococcus agalactiae NOT DETECTED NOT DETECTED Final   Streptococcus pneumoniae NOT DETECTED NOT DETECTED Final   Streptococcus pyogenes NOT DETECTED NOT DETECTED Final   A.calcoaceticus-baumannii NOT DETECTED NOT DETECTED Final   Bacteroides fragilis NOT DETECTED NOT DETECTED Final   Enterobacterales NOT DETECTED NOT DETECTED Final   Enterobacter cloacae complex NOT DETECTED NOT DETECTED Final   Escherichia coli NOT DETECTED NOT DETECTED Final   Klebsiella aerogenes NOT DETECTED NOT DETECTED Final   Klebsiella oxytoca NOT DETECTED NOT DETECTED Final   Klebsiella pneumoniae NOT DETECTED NOT DETECTED Final   Proteus species NOT DETECTED NOT DETECTED Final   Salmonella species NOT DETECTED NOT DETECTED Final   Serratia marcescens NOT DETECTED NOT DETECTED Final   Haemophilus influenzae NOT DETECTED NOT DETECTED Final   Neisseria meningitidis NOT DETECTED NOT DETECTED Final   Pseudomonas aeruginosa NOT DETECTED NOT DETECTED Final   Stenotrophomonas maltophilia NOT DETECTED NOT DETECTED Final   Candida albicans NOT DETECTED NOT DETECTED Final   Candida auris NOT DETECTED NOT DETECTED Final   Candida glabrata NOT DETECTED NOT DETECTED Final   Candida krusei NOT DETECTED NOT DETECTED Final   Candida parapsilosis NOT DETECTED NOT DETECTED Final   Candida  tropicalis NOT DETECTED NOT DETECTED Final   Cryptococcus neoformans/gattii NOT DETECTED NOT DETECTED Final   Methicillin resistance mecA/C DETECTED (A) NOT DETECTED Final    Comment: CRITICAL RESULT CALLED TO, READ BACK BY AND VERIFIED WITH:  ABBY ELLINGTON AT 0017 09/22/19 SDR Performed at [facility], [address] [place], [place] [address]   SARS Coronavirus 2 by RT PCR (hospital order, performed in [place] hospital lab) Nasopharyngeal Nasopharyngeal Swab     Status: None   Collection Time: 09/21/19  3:28 PM   Specimen: Nasopharyngeal Swab  Result Value Ref Range Status   SARS Coronavirus 2 NEGATIVE NEGATIVE Final    Comment: (NOTE) SARS-CoV-2 target nucleic acids are NOT DETECTED.  The SARS-CoV-2 RNA is generally detectable in upper and lower respiratory specimens during the acute phase of infection. The lowest concentration of SARS-CoV-2 viral copies this assay can detect is 250 copies / mL. A negative result does not preclude SARS-CoV-2 infection and should not be used as the sole basis for treatment or other patient management decisions.  A negative result may occur with improper specimen collection / handling, submission of specimen other than nasopharyngeal swab, presence of viral mutation(s) within the areas targeted by this assay, and inadequate number of viral copies (<250 copies / mL). A negative result must be combined with clinical observations, patient history, and epidemiological information.  Fact Sheet for Patients:   StrictlyIdeas.no  Fact Sheet for Healthcare Providers: BankingDealers.co.za  This test is not yet approved or  cleared by the [place] FDA and has been authorized for detection and/or diagnosis of SARS-CoV-2 by FDA under an Emergency Use Authorization (EUA).  This EUA will remain in effect (meaning this test can be used) for the duration of the COVID-19 declaration under Section  564(b)(1) of the Act, 21 U.S.C. section 360bbb-3(b)(1), unless the authorization is terminated or revoked  sooner.  Performed at [facility], [address] [place], [place] [address]   MRSA PCR Screening     Status: None   Collection Time: 09/21/19 10:16 PM   Specimen: Nasal Mucosa; Nasopharyngeal  Result Value Ref Range Status   MRSA by PCR NEGATIVE NEGATIVE Final    Comment:        The GeneXpert MRSA Assay (FDA approved for NASAL specimens only), is one component of a comprehensive MRSA colonization surveillance program. It is not intended to diagnose MRSA infection nor to guide or monitor treatment for MRSA infections. Performed at [facility], [address] [place], [place] [address]      Radiology Studies: DG Chest [place] 1 View  Result Date: 09/25/2019 CLINICAL DATA:  Shortness of breath EXAM: PORTABLE CHEST 1 VIEW COMPARISON:  September 22, 2019 FINDINGS: Endotracheal tube and nasogastric tube no longer evident. Thoracic stimulator leads in midthoracic region. There is mild bibasilar atelectasis. Lungs elsewhere are clear. The heart size and pulmonary vascularity are normal. No adenopathy. There is aortic atherosclerosis. There is a stent in the left subclavian artery. There is postoperative change in the lower cervical region. IMPRESSION: No pneumothorax. Mild bibasilar atelectasis. No edema or airspace opacity. Stable cardiac silhouette. Aortic Atherosclerosis (ICD10-I70.0). Electronically Signed   By: Lowella Grip III M.D.   On: 09/25/2019 09:41   DG Chest [facility] 1 View  Final Result    US RENAL  Final Result    DG Chest [facility] 1 View  Final Result    DG Chest Portable 1 View  Final Result    DG Chest Port 1 View  Final Result      Scheduled Meds: . amiodarone  150 mg Intravenous Once  . atorvastatin  40 mg Oral Daily  . chlorhexidine gluconate (MEDLINE KIT)  15 mL Mouth Rinse BID  . diltiazem  30 mg Oral Q6H  . feeding supplement  (NEPRO CARB STEADY)  237 mL Oral BID BM  . furosemide  40 mg Intravenous BID  . insulin aspart  0-5 Units Subcutaneous QHS  . insulin aspart  0-9 Units Subcutaneous TID WC  . ipratropium-albuterol  3 mL Nebulization Q6H  . metoprolol tartrate  25 mg Oral BID  . nicotine  21 mg Transdermal Daily  . pantoprazole  40 mg Oral QHS  . pregabalin  200 mg Oral TID  . rivaroxaban  20 mg Oral Q supper   PRN Meds: ALPRAZolam, alteplase, docusate, melatonin, morphine injection, ondansetron (ZOFRAN) IV, polyethylene glycol, sodium chloride flush Continuous Infusions: . sodium chloride Stopped (09/21/19 1624)  . amiodarone     Followed by  . amiodarone        LOS: 4 days  Time spent: Greater than 50% of the 35 minute visit was spent in counseling/coordination of care for the patient as laid out in the A&P.   Dwyane Dee, MD [facility] 09/25/2019, 6:29 PM  Contact via secure chat.  To contact the attending provider between 7A-7P or the covering provider during after hours 7P-7A, please log into the web site [email or web address] and access using universal [place] password for that web site. If you do not have the password, please call the hospital operator.

## 2019-09-25 NOTE — Progress Notes (Signed)
Daily Progress Note   Patient Name: Gregory Hall       Date: 09/25/2019 DOB: [birth year]  Age: 64 y.o. MRN#: [id number] Attending Physician: Dwyane Dee, MD Primary Care Physician: Remi Haggard, FNP Admit Date: 09/21/2019  Reason for Consultation/Follow-up: Establishing goals of care  Subjective: Patient is resting in bed. He states he is feeling much better, and has 2 urine bottles which are full at his bedside. He tells me he hates for his sister to be in a position of having to make decisions for him, but she is his surrogate Media planner.   He states he lives in a group home at baseline because of his COPD. He uses 3 lpm at baseline and uses a wheelchair.   We discussed his diagnoses, prognosis, GOC, EOL wishes disposition and options.  A detailed discussion was had today regarding advanced directives.  Concepts specific to code status, artifical feeding and hydration, IV antibiotics and rehospitalization were discussed.  The difference between an aggressive medical intervention path and a comfort care path was discussed.  Values and goals of care important to patient and family were attempted to be elicited.  Discussed limitations of medical interventions to prolong quality of life in some situations and discussed the concept of human mortality.  He states he would never want to be placed on a ventilator again. He confirms DNR status. He states he does not believe he would want to return to the hospital. He states he has fought for a long time with health issues, and is tired and believes he would really just want to focus on comfort, but he states he likes living in his group home and that the owner does not like hospice. He states he will consider his options.    Recommend palliative at D/C.   Length of Stay: 4  Current Medications: Scheduled Meds:  . atorvastatin  40 mg Oral Daily  . chlorhexidine gluconate (MEDLINE KIT)  15 mL Mouth Rinse BID  . diltiazem  30 mg Oral Q6H  . feeding supplement (NEPRO CARB STEADY)  237 mL Oral BID BM  . furosemide  40 mg Intravenous BID  . insulin aspart  0-5 Units Subcutaneous QHS  . insulin aspart  0-9 Units Subcutaneous TID WC  . ipratropium-albuterol  3 mL Nebulization Q6H  . metoprolol  tartrate  25 mg Oral BID  . nicotine  21 mg Transdermal Daily  . pantoprazole  40 mg Oral QHS  . pregabalin  200 mg Oral TID  . rivaroxaban  20 mg Oral Q supper    Continuous Infusions: . sodium chloride Stopped (09/21/19 1624)    PRN Meds: ALPRAZolam, alteplase, docusate, melatonin, morphine injection, ondansetron (ZOFRAN) IV, polyethylene glycol, sodium chloride flush  Physical Exam Pulmonary:     Effort: Pulmonary effort is normal.             Vital Signs: BP (!) 103/59 (BP Location: Right Arm)   Pulse (!) 108   Temp 98.7 F (37.1 C) (Oral)   Resp 14   Ht 6' (1.829 m)   Wt 97.6 kg   SpO2 96%   BMI 29.19 kg/m  SpO2: SpO2: 96 % O2 Device: O2 Device: Nasal Cannula O2 Flow Rate: O2 Flow Rate (L/min): 3 L/min  Intake/output summary:   Intake/Output Summary (Last 24 hours) at 09/25/2019 1502 Last data filed at 09/25/2019 1403 Gross per 24 hour  Intake 984.72 ml  Output 1975 ml  Net -990.28 ml   LBM: Last BM Date: 09/25/19 Baseline Weight: Weight: 99 kg Most recent weight: Weight: 97.6 kg       Palliative Assessment/Data:      Patient Active Problem List   Diagnosis Date Noted  . Atrial flutter ([place])   . Hyperkalemia   . Acute metabolic encephalopathy   . Acute on chronic respiratory failure with hypoxia ([place]) 09/13/2019  . Acute CHF (congestive heart failure) ([place]) 09/12/2019  . NSTEMI (non-ST elevated myocardial infarction) ([place]) 09/12/2019  . Acute kidney failure, unspecified  ([place]) 09/12/2019  . Diabetes mellitus type 2, uncontrolled, with complications ([place]) 02/77/4128  . Chronic pain syndrome 06/10/2019  . COPD exacerbation ([place]) 06/10/2019  . COPD with acute exacerbation ([place]) 06/09/2019  . CAD (coronary artery disease) 06/09/2019  . HTN (hypertension) 06/09/2019  . Acute on chronic respiratory failure with hypoxia and hypercapnia ([place]) 06/09/2019  . Anxiety 06/09/2019  . Chronic prescription opiate use 06/09/2019  . Hyperglycemia 06/09/2019  . Subclavian artery stenosis, left ([place]) 08/20/2014  . Rectus diastasis 07/19/2012  . Hernia   . Edema 05/10/2011  . Goals of care, counseling/discussion 10/06/2010  . SMOKER 11/25/2009  . CAROTID BRUIT, RIGHT 11/24/2009  . Chest pain 11/24/2009  . Hyperlipidemia 03/30/2009  . Coronary atherosclerosis 03/30/2009  . HYPERTENSION, HX OF 03/30/2009    Palliative Care Assessment & Plan    Recommendations/Plan: Recommend D/C with palliative and transition to hospice when he is ready.     Code Status:    Code Status Orders  (From admission, onward)         Start     Ordered   09/23/19 1156  Do not attempt resuscitation (DNR)  Continuous       Question Answer Comment  In the event of cardiac or respiratory ARREST Do not call a "code blue"   In the event of cardiac or respiratory ARREST Do not perform Intubation, CPR, defibrillation or ACLS   In the event of cardiac or respiratory ARREST Use medication by any route, position, wound care, and other measures to relive pain and suffering. May use oxygen, suction and manual treatment of airway obstruction as needed for comfort.   Comments MOST form on chart      09/23/19 1156        Code Status History    Date Active Date Inactive Code Status Order  ID Comments User Context   09/22/2019 1551 09/23/2019 1156 Partial Code [id number]  Asencion Gowda, NP Inpatient   09/22/2019 1549 09/22/2019 1551 Partial Code [id number]  Asencion Gowda, NP Inpatient    09/21/2019 1901 09/22/2019 1549 Full Code [id number]  Tyler Pita, MD ED   09/12/2019 2339 09/14/2019 2108 Full Code [id number]  Chotiner, Yevonne Aline, MD ED   06/09/2019 2251 06/14/2019 2200 Full Code [id number]  Athena Masse, MD ED   Advance Care Planning Activity      Prognosis: Poor long term.   Thank you for allowing the Palliative Medicine Team to assist in the care of this patient.   Total Time 35 min Prolonged Time Billed  no      Greater than 50%  of this time was spent counseling and coordinating care related to the above assessment and plan.  Asencion Gowda, NP  Please contact Palliative Medicine Team phone at [phone] for questions and concerns.

## 2019-09-25 NOTE — Progress Notes (Signed)
PT Cancellation Note  Patient Details Name: Gregory Hall MRN: [id number] DOB: 10-27-1955   Cancelled Treatment:    Reason Eval/Treat Not Completed: Patient not medically ready.  PT consult received.  Chart reviewed.  Pt's most recent BP in chart at 0844 noted to be 83/55.  D/t low BP, pt does not appear appropriate for PT evaluation at this time.  Will hold PT at this time and re-attempt PT evaluation at a later date/time as medically appropriate.  Hendricks Limes, PT 09/25/19, 8:55 AM

## 2019-09-25 NOTE — Assessment & Plan Note (Addendum)
-  Repeat echo pending to reevaluate in setting of a flutter -Cardiology following, appreciate assistance -Previous echo from 09/14/2019 revealed EF 65 to 70% with grade 2 diastolic dysfunction -On exam, he is edematous and appears volume overloaded - continue lasix, restarted per cardiology

## 2019-09-25 NOTE — Plan of Care (Signed)
°  Problem: Education: Goal: Understanding of medication regimen will improve Outcome: Progressing  Cardizem drip infusing with HR controlled.  Atrial flutter continues on telemetry.

## 2019-09-25 NOTE — Assessment & Plan Note (Signed)
-   now resolved; considered multifactorial notably due to respiratory failure and renal failure

## 2019-09-25 NOTE — Assessment & Plan Note (Signed)
-  Now resolved.  Has responded to treatment

## 2019-09-25 NOTE — Care Management Important Message (Signed)
Important Message  Patient Details  Name: Gregory Hall MRN: [id number] Date of Birth: 02/09/1955   Medicare Important Message Given:  Yes     [name] 09/25/2019, 1:16 PM

## 2019-09-25 NOTE — Progress Notes (Signed)
PT Cancellation Note  Patient Details Name: Gregory Hall MRN: [id number] DOB: 1955-03-08   Cancelled Treatment:    Reason Eval/Treat Not Completed: Patient not medically ready Spoke with nurse earlier in the afternoon who reports that BP has been labile and he has been having RVR with PVCs and due to cardiac issues it may be best to hold today.  Checking back later his HR was in the 80s and she okayed PT to try and do an eval. However during information gathering (laying in bed, not activity) his tele was reading all over with 120-140s being typical with PVC.  Spoke with nursing again about this and she again stated to just hold for today and try back tomorrow.  Malachi Pro, DPT 09/25/2019, 3:45 PM

## 2019-09-25 NOTE — Progress Notes (Signed)
Progress Note  Patient Name: Gregory Hall Date of Encounter: 09/25/2019  Primary Cardiologist: Kathlyn Sacramento, MD  Subjective   Has been having intermittent tachypalpitations corresponding w/ faster aflutter/fib on tele, with rates into the 130's, lasting hours at a time. He does become dyspneic w/ tachycardia.  No chest pain. Also notes increasing edema.  Inpatient Medications    Scheduled Meds: . atorvastatin  20 mg Oral Daily  . chlorhexidine gluconate (MEDLINE KIT)  15 mL Mouth Rinse BID  . diltiazem  30 mg Oral Q6H  . feeding supplement (NEPRO CARB STEADY)  237 mL Oral BID BM  . furosemide  40 mg Intravenous BID  . insulin aspart  0-5 Units Subcutaneous QHS  . insulin aspart  0-9 Units Subcutaneous TID WC  . ipratropium-albuterol  3 mL Nebulization Q6H  . metoprolol tartrate  25 mg Oral BID  . nicotine  21 mg Transdermal Daily  . pantoprazole  40 mg Oral QHS  . pregabalin  200 mg Oral TID  . rivaroxaban  20 mg Oral Q supper   Continuous Infusions: . sodium chloride Stopped (09/21/19 1624)   PRN Meds: ALPRAZolam, alteplase, docusate, melatonin, morphine injection, ondansetron (ZOFRAN) IV, polyethylene glycol, sodium chloride flush   Vital Signs    Vitals:   09/25/19 0906 09/25/19 0940 09/25/19 1045 09/25/19 1113  BP: (!) 88/56 (!) 89/58 110/72 108/66  Pulse: (!) 102 (!) 57 81 83  Resp:   18 14  Temp:   98.7 F (37.1 C)   TempSrc:   Oral   SpO2: 93% 97% 97% 96%  Weight:      Height:        Intake/Output Summary (Last 24 hours) at 09/25/2019 1214 Last data filed at 09/25/2019 1100 Gross per 24 hour  Intake 1615.61 ml  Output 475 ml  Net 1140.61 ml   Filed Weights   09/23/19 0330 09/24/19 0419 09/25/19 0627  Weight: 95.2 kg 95.2 kg 97.6 kg    Physical Exam   GEN: Well nourished, well developed, in no acute distress.  HEENT: Grossly normal.  Neck: Supple, mod elev JVP, no carotid bruits, or masses. Cardiac: RRR, distant, no murmurs, rubs, or  gallops. No clubbing, cyanosis.  2+ bilat upper and lower ext edema.  Radials/DP/PT 1+ and equal bilaterally.  Respiratory:  Respirations regular and unlabored, coarse breath sounds throughout w/ insp/exp wheezing and bibasilar crackles. GI: Soft, nontender, nondistended, BS + x 4. MS: no deformity or atrophy. Skin: warm and dry, no rash. Neuro:  Strength and sensation are intact. Psych: AAOx3.  Normal affect.  Labs    Chemistry Recent Labs  Lab 09/21/19 1526 09/22/19 0515 09/23/19 1701 09/24/19 0421 09/25/19 0545  NA 129*   < > 144 142 141  K 7.0*   < > 4.4 4.1 4.0  CL 82*   < > 102 103 103  CO2 34*   < > 30 31 30   GLUCOSE 142*   < > 141* 148* 121*  BUN 127*   < > 26* 19 11  CREATININE 7.07*   < > 1.12 1.13 0.92  CALCIUM 8.0*   < > 8.1* 8.3* 8.2*  PROT 6.1*  --  5.2*  --   --   ALBUMIN 3.1*  --  2.6*  --   --   AST 24  --  26  --   --   ALT 18  --  22  --   --   ALKPHOS 79  --  64  --   --   BILITOT 0.9  --  0.6  --   --   GFRNONAA 7*   < > >60 >60 >60  GFRAA 9*   < > >60 >60 >60  ANIONGAP 13   < > 12 8 8    < > = values in this interval not displayed.     Hematology Recent Labs  Lab 09/21/19 1526 09/22/19 0515  WBC 11.7* 10.0  RBC 4.13* 3.99*  HGB 11.5* 11.1*  HCT 35.5* 34.8*  MCV 86.0 87.2  MCH 27.8 27.8  MCHC 32.4 31.9  RDW 17.9* 18.6*  PLT 328 353    Cardiac Enzymes  Recent Labs  Lab 09/12/19 2043 09/21/19 1526 09/21/19 1920  TROPONINIHS 78* 29* 21*      BNP Recent Labs  Lab 09/21/19 1527  BNP 247.5*     Lipids  Lab Results  Component Value Date   CHOL 110 06/10/2019   HDL 44 06/10/2019   LDLCALC 53 06/10/2019   TRIG 88 09/21/2019   CHOLHDL 2.5 06/10/2019    HbA1c  Lab Results  Component Value Date   HGBA1C 7.0 (H) 09/13/2019    Radiology    US RENAL  Result Date: 09/22/2019 CLINICAL DATA:  Acute renal failure EXAM: RENAL / URINARY TRACT ULTRASOUND COMPLETE COMPARISON:  None. FINDINGS: Right Kidney: Renal measurements: 10.9  x 5.7 x 5.0 cm = volume: 163 mL. Echogenic renal parenchyma. No mass or hydronephrosis visualized. Left Kidney: Renal measurements: 11.0 x 5.3 x 6.0 cm = volume: 180 mL. Echogenic renal parenchyma. No mass or hydronephrosis visualized. Bladder: Decompressed by indwelling Foley catheter Other: None. IMPRESSION: Echogenic renal parenchyma, suggesting medical renal disease. No hydronephrosis. Bladder decompressed by indwelling Foley catheter. Electronically Signed   By: Julian Hy M.D.   On: 09/22/2019 09:36   DG Chest Port 1 View  Result Date: 09/25/2019 CLINICAL DATA:  Shortness of breath EXAM: PORTABLE CHEST 1 VIEW COMPARISON:  September 22, 2019 FINDINGS: Endotracheal tube and nasogastric tube no longer evident. Thoracic stimulator leads in midthoracic region. There is mild bibasilar atelectasis. Lungs elsewhere are clear. The heart size and pulmonary vascularity are normal. No adenopathy. There is aortic atherosclerosis. There is a stent in the left subclavian artery. There is postoperative change in the lower cervical region. IMPRESSION: No pneumothorax. Mild bibasilar atelectasis. No edema or airspace opacity. Stable cardiac silhouette. Aortic Atherosclerosis (ICD10-I70.0). Electronically Signed   By: Lowella Grip III M.D.   On: 09/25/2019 09:41   DG Chest Port 1 View  Result Date: 09/22/2019 CLINICAL DATA:  Hypoxia EXAM: PORTABLE CHEST 1 VIEW COMPARISON:  September 21, 2019 FINDINGS: Endotracheal tube tip is 8.4 cm above the carina. Nasogastric tube tip and side port are below the diaphragm. Stimulator leads are overlying the midthoracic region. No pneumothorax. There is atelectatic change in the lung bases with partial clearing of airspace opacity from the bases. No new opacity evident. Heart is upper normal in size with pulmonary vascularity normal. No adenopathy. There is a stent in the left subclavian artery region. There is aortic atherosclerosis. No bone lesions. IMPRESSION: Tube and  catheter positions as described without pneumothorax. Bibasilar atelectasis with partial clearing of airspace opacity from the bases. No new opacity. Stable cardiac silhouette. Aortic Atherosclerosis (ICD10-I70.0). Electronically Signed   By: Lowella Grip III M.D.   On: 09/22/2019 08:05   DG Chest Portable 1 View  Result Date: 09/21/2019 CLINICAL DATA:  Post intubation. EXAM: PORTABLE CHEST 1 VIEW COMPARISON:  09/21/2019 FINDINGS: There is a worsening airspace opacity at the right lung base. The endotracheal tube terminates above the carina by approximately 4.5 cm. The enteric tube appears to terminate at the level of the diaphragm. Coarse airspace opacities are noted at the lung bases. There is no pneumothorax or significant pleural effusion. A left subclavian artery stent is noted the lungs are hyperexpanded. Emphysematous changes are again noted. IMPRESSION: 1. Lines and tubes as above. The enteric tube is not appear to extend into the stomach. Repositioning is recommended. 2. New airspace opacity at the right lung base concerning for worsening atelectasis or a developing infiltrate. There is persistent atelectasis at the left lung base. These results will be called to the ordering clinician or representative by the Radiologist Assistant, and communication documented in the PACS or [employer]. Electronically Signed   By: Constance Holster M.D.   On: 09/21/2019 18:30   DG Chest Port 1 View  Result Date: 09/21/2019 CLINICAL DATA:  64 year old with possible sepsis and acute mental status changes. Patient lethargic and unresponsive. Hypotension. Current smoker. Follow-up pneumonia. EXAM: PORTABLE CHEST 1 VIEW COMPARISON:  09/12/2019 and earlier, including CTA chest 06/12/2019. FINDINGS: Cardiac silhouette normal in size for AP portable technique, unchanged. Thoracic aorta atherosclerotic, unchanged. Prominent central pulmonary arteries, unchanged. Since the most recent prior examination 9 days  ago, improved aeration in the lung bases with near complete resolution of the airspace opacities present at that time. Residual mild linear atelectasis at the RIGHT lung base. No new pulmonary parenchymal abnormalities. Emphysematous changes as noted on the prior CT. Dorsal cord stimulating device overlies the mid thoracic spine, unchanged. IMPRESSION: Near complete resolution of the bibasilar pneumonia since the most recent prior examination 9 days ago. Residual mild linear atelectasis at the RIGHT lung base. Electronically Signed   By: Evangeline Dakin M.D.   On: 09/21/2019 16:05    Telemetry    Baseline aflutter in the 80's with occasional bursts of afl/coarse afib into the 130's, sometimes lasting several hours before returning to aflutter in the 80's - Personally Reviewed  Cardiac Studies   2D Echocardiogram 9.5.2021   1. Left ventricular ejection fraction, by estimation, is 65 to 70%. The  left ventricle has normal function. The left ventricle has no regional  wall motion abnormalities. Left ventricular diastolic parameters are  consistent with Grade II diastolic  dysfunction (pseudonormalization).   2. Right ventricular systolic function is normal. The right ventricular  size is normal. There is normal pulmonary artery systolic pressure.   3. The mitral valve is normal in structure. No evidence of mitral valve  regurgitation.   4. The aortic valve was not well visualized. Aortic valve regurgitation  not assessed.   5. Pulmonic valve regurgitation not assessed.   6. The inferior vena cava is normal in size with greater than 50%  respiratory variability, suggesting right atrial pressure of 3 mmHg.    Patient Profile     64 y.o. male w/ a h/o PAF/flutter, CAD s/p prior LAD PCI in 2010, HTN, HL, COPD, and diabetes, who was recently admitted 9/3 w/ resp failure/COPD and mild trop elevation (nl EF by echo) and was readmitted 9/12 after being found unresponsive w/ resp failure req  intubation, hypotension, AKI (creat 7.07), hyperkalemia (7.0) treated w/ dialysis, who later developed atrial flutter w/ RVR on 9/13.  Assessment & Plan    1.  Atrial flutter with RVR:  As above, pt presented in sinus rhythm on 9/12 in the setting of unresponsiveness,  resp failure req intubation, hypotension, acute renal failure, and hyperkalemia.  Late on 9/13, he developed aflutter w/ RVR, which was initially treated w/ amio and later transitioned to IV and then oral dilt and  blocker.  He has been anticoagulated w/ xarelto, at least dating back to his last discharge, though he says he's been on it for several years.  He remains in atrial flutter w/ baseline rates in the 80's, during which times he is asymptomatic.  Unfortunately, he will have faster paroxysms of faster aflutter w/ variable conduction vs. Afib.  During these episodes, he becomes more dyspneic.  In discussing Ss with him, he notes that he freq experiences tachypalps @ home, up to several x/wk, sometimes lasting hours at a time, and resolving spontaneously.  Thus w/ what sounds like freq paroxysms of PAF/flutter, utility of DCCV is lessened acutely.  He is volume overloaded this AM.  I will add lasix 40 IV bid and cont current dilt/ blocker doses as bp too soft to titrate.  Will add digoxin for rate control as renal fxn now nl.  We may still need to consider DCCV if rates are difficult to manage, but hopefully with improved volume mgmt and improvement in resp status, he will convert on his own.  2.  Acute on chronic hypoxic respiratory failure/AECOPD: Lungs remain very tight.  Also volume overloaded.  Diurese as above.  Nebs per IM.  May need additional steroid tx.  3.  Acute HF w/ preserved EF:  Nl EF last admission.  Now volume overloaded.  Net + 1.2L this admission in setting of AKI/hypotension earlier in admission.  Stop saline (was getting 75/hr).  Diurese as above.  Will repeat limited echo to re-eval EF in setting of atrial flutter  - ? New LV dysfxn.  Rate control as above.  Albumin low @ 2.6 - likely contributing to swelling.  4.  Abnl ECG:  Admission ECG markedly abnl compared to 9/3 ECG, w/ mild ST elevation in V1-2 and inferior/antlat ST dep w/ TWI.  This occurred in the setting of metabolic derangement, AKI/hyperkalemia.  HsTrops minimally elevated w/ flat trend (29  21) despite resp failure, acidosis, shock.  9/15 ECG notable for rapid atrial flutter w/o significant ST/T changes.  F/u limited echo as above to assess EF, but at this point, suspect abnl ECG's primarily the result of metabolic abnormalities.    5.  CAD/elevated HsTroponin:  H/o LAD stenting in 2010.  No chest pain.  As above, mild HsT elevation w/ flat trend this admission.  Suspect demand ischemia.  Cont  blocker, statin.  No asa in setting of xarelto.  6.  Acute renal failure:  Resolved. Follow w/ diuresis.  7.  Hyperkalemia:  In setting of #6.  Resolved.  8. Hyperlipidemia:  LDL 57.  Cont statin rx.  Signed, Murray Hodgkins, NP  09/25/2019, 12:14 PM    For questions or updates, please contact   Please consult [email or web address] for contact info under Cardiology/STEMI.

## 2019-09-25 NOTE — Progress Notes (Addendum)
Patient afib w/ RVR sustaining in the 140's, BP 76/56 92% on 4L n/c, cardizem gtt stopped. pt complaining of sob, per patient " I just don't feel right".  Dr. Frederick Peers notified per MD stop cardizem gtt and will come assess pt.

## 2019-09-25 NOTE — Progress Notes (Signed)
Pt in Afib with RVR again at 130's-140's, Dr. Mariah Milling on the floor and notified, per MD will order IV dig. Will administer and continue to monitor.

## 2019-09-26 ENCOUNTER — Encounter: Payer: Self-pay | Admitting: Cardiovascular Disease

## 2019-09-26 ENCOUNTER — Encounter
Admission: EM | Disposition: A | Discharge status: Home / Self Care | Payer: Self-pay | Source: Home / Self Care | Attending: Internal Medicine

## 2019-09-26 ENCOUNTER — Inpatient Hospital Stay: Payer: Medicare Other | Admitting: Anesthesiology

## 2019-09-26 ENCOUNTER — Inpatient Hospital Stay (HOSPITAL_COMMUNITY)
Admit: 2019-09-26 | Discharge: 2019-09-26 | Disposition: A | Discharge status: Home / Self Care | Payer: Medicare Other | Attending: Nurse Practitioner | Admitting: Nurse Practitioner

## 2019-09-26 ENCOUNTER — Ambulatory Visit: Payer: Medicare Other | Admitting: Family

## 2019-09-26 DIAGNOSIS — I484 Atypical atrial flutter: Secondary | ICD-10-CM

## 2019-09-26 DIAGNOSIS — I483 Typical atrial flutter: Secondary | ICD-10-CM

## 2019-09-26 DIAGNOSIS — I4892 Unspecified atrial flutter: Secondary | ICD-10-CM

## 2019-09-26 DIAGNOSIS — I25118 Atherosclerotic heart disease of native coronary artery with other forms of angina pectoris: Secondary | ICD-10-CM

## 2019-09-26 HISTORY — PX: CARDIOVERSION: SHX1299

## 2019-09-26 LAB — CBC WITH DIFFERENTIAL/PLATELET
Abs Immature Granulocytes: 0.03 10*3/uL (ref 0.00–0.07)
Basophils Absolute: 0 10*3/uL (ref 0.0–0.1)
Basophils Relative: 0 %
Eosinophils Absolute: 0.3 10*3/uL (ref 0.0–0.5)
Eosinophils Relative: 3 %
HCT: 32 % — ABNORMAL LOW (ref 39.0–52.0)
Hemoglobin: 10 g/dL — ABNORMAL LOW (ref 13.0–17.0)
Immature Granulocytes: 0 %
Lymphocytes Relative: 19 %
Lymphs Abs: 1.6 10*3/uL (ref 0.7–4.0)
MCH: 28.1 pg (ref 26.0–34.0)
MCHC: 31.3 g/dL (ref 30.0–36.0)
MCV: 89.9 fL (ref 80.0–100.0)
Monocytes Absolute: 1 10*3/uL (ref 0.1–1.0)
Monocytes Relative: 12 %
Neutro Abs: 5.5 10*3/uL (ref 1.7–7.7)
Neutrophils Relative %: 66 %
Platelets: 196 10*3/uL (ref 150–400)
RBC: 3.56 MIL/uL — ABNORMAL LOW (ref 4.22–5.81)
RDW: 19 % — ABNORMAL HIGH (ref 11.5–15.5)
WBC: 8.5 10*3/uL (ref 4.0–10.5)
nRBC: 0 % (ref 0.0–0.2)

## 2019-09-26 LAB — GLUCOSE, CAPILLARY
Glucose-Capillary: 129 mg/dL — ABNORMAL HIGH (ref 70–99)
Glucose-Capillary: 231 mg/dL — ABNORMAL HIGH (ref 70–99)
Glucose-Capillary: 230 mg/dL — ABNORMAL HIGH (ref 70–99)
Glucose-Capillary: 190 mg/dL — ABNORMAL HIGH (ref 70–99)

## 2019-09-26 LAB — BASIC METABOLIC PANEL
Anion gap: 10 (ref 5–15)
BUN: 11 mg/dL (ref 8–23)
CO2: 31 mmol/L (ref 22–32)
Calcium: 8.5 mg/dL — ABNORMAL LOW (ref 8.9–10.3)
Chloride: 100 mmol/L (ref 98–111)
Creatinine, Ser: 0.93 mg/dL (ref 0.61–1.24)
GFR calc Af Amer: >60 mL/min (ref >60)
GFR calc non Af Amer: >60 mL/min (ref >60)
Glucose, Bld: 130 mg/dL — ABNORMAL HIGH (ref 70–99)
Potassium: 4 mmol/L (ref 3.5–5.1)
Sodium: 141 mmol/L (ref 135–145)

## 2019-09-26 LAB — ECHOCARDIOGRAM LIMITED
Height: 72 in
S' Lateral: 2.63 cm
Weight: 3473.6 oz

## 2019-09-26 LAB — CULTURE, BLOOD (ROUTINE X 2)
Culture: NO GROWTH
Special Requests: ADEQUATE

## 2019-09-26 LAB — MAGNESIUM: Magnesium: 1.4 mg/dL — ABNORMAL LOW (ref 1.7–2.4)

## 2019-09-26 SURGERY — CARDIOVERSION
Anesthesia: General

## 2019-09-26 MED ORDER — ALPRAZOLAM 0.25 MG PO TABS
0.2500 mg | ORAL_TABLET | Freq: Two times a day (BID) | ORAL | Status: DC | PRN
  Administered 2019-09-26 – 2019-09-28 (×5): 0.25 mg via ORAL
  Filled 2019-09-26 (×5): qty 1

## 2019-09-26 MED ORDER — PROPOFOL 10 MG/ML IV BOLUS
INTRAVENOUS | Status: DC | PRN
  Administered 2019-09-26: 20 mg via INTRAVENOUS
  Administered 2019-09-26: 30 mg via INTRAVENOUS

## 2019-09-26 MED ORDER — MAGNESIUM SULFATE 4 GM/100ML IV SOLN: 4.0000 g | Freq: Once | INTRAVENOUS | Status: DC

## 2019-09-26 MED ORDER — PHENYLEPHRINE HCL (PRESSORS) 10 MG/ML IV SOLN
INTRAVENOUS | Status: AC
  Filled 2019-09-26: qty 1

## 2019-09-26 MED ORDER — FUROSEMIDE 20 MG PO TABS
20.0000 mg | ORAL_TABLET | Freq: Every day | ORAL | Status: DC
  Administered 2019-09-26 – 2019-09-28 (×3): 20 mg via ORAL
  Filled 2019-09-26 (×3): qty 1

## 2019-09-26 MED ORDER — ALPRAZOLAM 0.25 MG PO TABS: 0.2500 mg | ORAL_TABLET | Freq: Once | ORAL | Status: DC

## 2019-09-26 MED ORDER — ALBUTEROL SULFATE (2.5 MG/3ML) 0.083% IN NEBU: 2.5000 mg | INHALATION_SOLUTION | RESPIRATORY_TRACT | Status: DC | PRN

## 2019-09-26 MED ORDER — EPHEDRINE 5 MG/ML INJ
INTRAVENOUS | Status: AC
  Filled 2019-09-26: qty 10

## 2019-09-26 MED ORDER — BISOPROLOL FUMARATE 5 MG PO TABS
5.0000 mg | ORAL_TABLET | Freq: Two times a day (BID) | ORAL | Status: DC
  Administered 2019-09-26 – 2019-09-27 (×4): 5 mg via ORAL
  Filled 2019-09-26 (×6): qty 1

## 2019-09-26 NOTE — Anesthesia Preprocedure Evaluation (Addendum)
Anesthesia Evaluation  Patient identified by MRN, date of birth, ID band Patient awake  General Assessment Comment:Per hospitalist note: Gregory Hall is a 64 yo CM with PMH COPD, HTN, GERD, HLD, depression/anxiety, CAD, DMII who presented to the hospital initially on 09/22/19 after being found unresponsive at a group home.  He is chronically on 3 L oxygen and was found to be hypoxic and hypotensive.  He resides at a group home.  He was intubated upon arrival to the hospital and initially required vasopressor support. Etiology of his respiratory failure was considered due to severe COPD exacerbation.  He was also evaluated by cardiology for new onset a flutter with RVR and started on a Cardizem infusion. He also developed renal failure with etiology considered due to his hypotension and possibly even his hypoxia on admission.  He required temporary dialysis and renal function slowly improved.  He was transferred out of the ICU on 09/24/2019.  Reviewed: Allergy & Precautions, NPO status , Patient's Chart, lab work & pertinent test results  History of Anesthesia Complications Negative for: history of anesthetic complications  Airway Mallampati: II  TM Distance: >3 FB Neck ROM: Full    Dental  (+) Edentulous Upper, Edentulous Lower   Pulmonary neg sleep apnea, COPD,  oxygen dependent, Current Smoker and Patient abstained from smoking.,  3L O2 at group home Breathing feels at baseline today    + decreased breath sounds(-) wheezing      Cardiovascular Exercise Tolerance: Poor METS: < 3 Mets hypertension, + CAD, + Past MI, + Peripheral Vascular Disease and +CHF  + dysrhythmias Atrial Fibrillation  Rhythm:Irregular Rate:Tachycardia - Systolic murmurs TTE 09/2019: 1. Left ventricular ejection fraction, by estimation, is 65 to 70%. The  left ventricle has normal function. The left ventricle has no regional  wall motion abnormalities. Left ventricular  diastolic parameters are  consistent with Grade II diastolic  dysfunction (pseudonormalization).  2. Right ventricular systolic function is normal. The right ventricular  size is normal. There is normal pulmonary artery systolic pressure.  3. The mitral valve is normal in structure. No evidence of mitral valve  regurgitation.  4. The aortic valve was not well visualized. Aortic valve regurgitation  not assessed.  5. Pulmonic valve regurgitation not assessed.  6. The inferior vena cava is normal in size with greater than 50%  respiratory variability, suggesting right atrial pressure of 3 mmHg.    Neuro/Psych PSYCHIATRIC DISORDERS Anxiety Depression negative neurological ROS     GI/Hepatic GERD  ,(+)     (-) substance abuse  ,   Endo/Other  diabetes  Renal/GU ARFRenal disease     Musculoskeletal   Abdominal   Peds  Hematology   Anesthesia Other Findings Past Medical History: 09/12/2019: AKI (acute kidney injury) (HCC) No date: CAD (coronary artery disease)     Comment:  s/p PTCA and stent x2 No date: Chest pain No date: Chronic pain syndrome No date: COPD (chronic obstructive pulmonary disease) (HCC) No date: Degenerative cervical disc No date: Depression No date: Diabetes mellitus without complication (HCC) No date: Dyslipidemia No date: GERD (gastroesophageal reflux disease) 2014: Hernia No date: Hypertension 2011: MRSA (methicillin resistant staph aureus) culture positive No date: Neuropathy 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/19/2012: Rectus diastasis  Reproductive/Obstetrics                           Anesthesia Physical Anesthesia Plan  ASA: IV  Anesthesia Plan: General   Post-op Pain Management:  Induction: Intravenous  PONV Risk Score and Plan: 1 and Ondansetron and TIVA  Airway Management Planned: Nasal Cannula and Natural Airway  Additional Equipment: None  Intra-op Plan:   Post-operative Plan:   Informed  Consent: I have reviewed the patients History and Physical, chart, labs and discussed the procedure including the risks, benefits and alternatives for the proposed anesthesia with the patient or authorized representative who has indicated his/her understanding and acceptance.   Patient has DNR.  Discussed DNR with patient and Suspend DNR.   Dental advisory given  Plan Discussed with: CRNA and Surgeon  Anesthesia Plan Comments: (Discussed risks of anesthesia with patient, including possibility of difficulty with spontaneous ventilation under anesthesia necessitating airway intervention, PONV, and rare risks such as cardiac or respiratory or neurological events. Patient understands. Patient counseled on being higher risk for anesthesia due to comorbidities: O2-dependent COPD, recent exacerbation and intubation. Patient was told about increased risk of cardiac and respiratory events, including death. Patient understands. )        Anesthesia Quick Evaluation

## 2019-09-26 NOTE — Transfer of Care (Signed)
Immediate Anesthesia Transfer of Care Note  Patient: Gregory Hall  Procedure(s) Performed: CARDIOVERSION (N/A )  Patient Location: spu  Anesthesia Type:General  Level of Consciousness: awake and alert   Airway & Oxygen Therapy: Patient Spontanous Breathing and Patient connected to nasal cannula oxygen  Post-op Assessment: Report given to RN and Post -op Vital signs reviewed and stable  Post vital signs: Reviewed  Last Vitals:  Vitals Value Taken Time  BP 131/78 09/26/19 0827  Temp    Pulse 79 09/26/19 0830  Resp 16 09/26/19 0831  SpO2 98 % 09/26/19 0830    Last Pain:  Vitals:   09/26/19 0809  TempSrc: Oral  PainSc: 7       Patients Stated Pain Goal: 4 (09/26/19 0809)  Complications: No complications documented.

## 2019-09-26 NOTE — Progress Notes (Signed)
   09/25/19 2004  Assess: MEWS Score  BP 91/67  Pulse Rate (!) 155  SpO2 94 %  Assess: MEWS Score  MEWS Temp 0  MEWS Systolic 1  MEWS Pulse 3  MEWS RR 0  MEWS LOC 0  MEWS Score 4  MEWS Score Color Red  Assess: if the MEWS score is Yellow or Red  Were vital signs taken at a resting state? Yes  Focused Assessment No change from prior assessment  Early Detection of Sepsis Score *See Row Information* Medium  MEWS guidelines implemented *See Row Information* Yes (elevated HR. amio gtt restarted)  Treat  MEWS Interventions Administered scheduled meds/treatments (amio gtt)  Take Vital Signs  Increase Vital Sign Frequency  Red: Q 1hr X 4 then Q 4hr X 4, if remains red, continue Q 4hrs  Escalate  MEWS: Escalate Red: discuss with charge nurse/RN and provider, consider discussing with RRT  Notify: Charge Nurse/RN  Name of Charge Nurse/RN Notified Alisa  Date Charge Nurse/RN Notified 09/25/19  Time Charge Nurse/RN Notified 1935  Notify: Provider  Provider Name/Title Gollan/ Nishan  Date Provider Notified 09/25/19  Time Provider Notified [phone]  Notification Type Page  Notification Reason Other (Comment) (elevated HR/ hypotensive)  Response See new orders  Date of Provider Response 09/25/19  Time of Provider Response 1930  Document  Patient Outcome Other (Comment) (improved)  Progress note created (see row info) Yes

## 2019-09-26 NOTE — Progress Notes (Signed)
Progress Note  Patient Name: Gregory Hall Date of Encounter: 09/26/2019  [facility] Cardiologist: Kathlyn Sacramento, MD   Subjective   Rapid atrial flutter overnight Rate up to 130 bpm yesterday digoxin IV x2 given with only temporary improvement in rate Into the evening evening, with associated hypotension down to 80 systolic Discussed with nursing overnight, started on midodrine 10 mg every 8 hours, systolic blood pressure up to 100 Started on amiodarone bolus with infusion yesterday evening for rate control Given hypotension, diltiazem, metoprolol and Lasix was held This morning on rounds continues in atrial flutter rate up to 160 bpm Continues on amiodarone infusion.  Midodrine not given this morning systolic blood pressure 056  Underwent cardioversion this morning which was successful, normal sinus rhythm restored, rate 70  Inpatient Medications    Scheduled Meds: . atorvastatin  40 mg Oral Daily  . bisoprolol  5 mg Oral BID  . chlorhexidine gluconate (MEDLINE KIT)  15 mL Mouth Rinse BID  . feeding supplement (NEPRO CARB STEADY)  237 mL Oral BID BM  . insulin aspart  0-5 Units Subcutaneous QHS  . insulin aspart  0-9 Units Subcutaneous TID WC  . ipratropium-albuterol  3 mL Nebulization Q6H  . midodrine  10 mg Oral Q8H  . nicotine  21 mg Transdermal Daily  . pantoprazole  40 mg Oral QHS  . pregabalin  200 mg Oral TID  . rivaroxaban  20 mg Oral Q supper   Continuous Infusions: . sodium chloride 0 mL (09/21/19 1624)  . amiodarone 30 mg/hr (09/26/19 0740)  . magnesium sulfate bolus IVPB     PRN Meds: ALPRAZolam, alteplase, docusate, melatonin, morphine injection, ondansetron (ZOFRAN) IV, polyethylene glycol, sodium chloride flush   Vital Signs    Vitals:   09/26/19 0837 09/26/19 0845 09/26/19 0857 09/26/19 1126  BP: 113/72 132/68 113/63 (!) 93/49  Pulse: 79 85 82 82  Resp: 15 17 18 20   Temp:    98.9 F (37.2 C)  TempSrc:    Oral  SpO2:   99% 94%   Weight:      Height:        Intake/Output Summary (Last 24 hours) at 09/26/2019 1142 Last data filed at 09/26/2019 1000 Gross per 24 hour  Intake 1692.92 ml  Output 4300 ml  Net -2607.08 ml   Last 3 Weights 09/26/2019 09/25/2019 09/24/2019  Weight (lbs) 217 lb 1.6 oz 215 lb 3.2 oz 209 lb 14.1 oz  Weight (kg) 98.476 kg 97.614 kg 95.2 kg      Telemetry    Atrial flutter rate 160 bpm- Personally Reviewed  ECG     - Personally Reviewed  Physical Exam  After cardioversion GEN: No acute distress.   Neck: No JVD Cardiac: RRR, no murmurs, rubs, or gallops.  Respiratory: Clear to auscultation bilaterally. GI: Soft, nontender, non-distended  MS: No edema; No deformity. Neuro:  Nonfocal  Psych: Normal affect   Labs    High Sensitivity Troponin:   Recent Labs  Lab 09/12/19 2043 09/21/19 1526 09/21/19 1920  TROPONINIHS 78* 29* 21*      Chemistry Recent Labs  Lab 09/21/19 1526 09/22/19 0515 09/23/19 1701 09/23/19 1701 09/24/19 0421 09/25/19 0545 09/26/19 0649  NA 129*   < > 144   < > 142 141 141  K 7.0*   < > 4.4   < > 4.1 4.0 4.0  CL 82*   < > 102   < > 103 103 100  CO2 34*   < >  30   < > 31 30 31   GLUCOSE 142*   < > 141*   < > 148* 121* 130*  BUN 127*   < > 26*   < > 19 11 11   CREATININE 7.07*   < > 1.12   < > 1.13 0.92 0.93  CALCIUM 8.0*   < > 8.1*   < > 8.3* 8.2* 8.5*  PROT 6.1*  --  5.2*  --   --   --   --   ALBUMIN 3.1*  --  2.6*  --   --   --   --   AST 24  --  26  --   --   --   --   ALT 18  --  22  --   --   --   --   ALKPHOS 79  --  64  --   --   --   --   BILITOT 0.9  --  0.6  --   --   --   --   GFRNONAA 7*   < > >60   < > >60 >60 >60  GFRAA 9*   < > >60   < > >60 >60 >60  ANIONGAP 13   < > 12   < > 8 8 10    < > = values in this interval not displayed.     Hematology Recent Labs  Lab 09/21/19 1526 09/22/19 0515 09/26/19 0649  WBC 11.7* 10.0 8.5  RBC 4.13* 3.99* 3.56*  HGB 11.5* 11.1* 10.0*  HCT 35.5* 34.8* 32.0*  MCV 86.0 87.2 89.9   MCH 27.8 27.8 28.1  MCHC 32.4 31.9 31.3  RDW 17.9* 18.6* 19.0*  PLT 328 353 196    BNP Recent Labs  Lab 09/21/19 1527  BNP 247.5*     DDimer No results for input(s): DDIMER in the last 168 hours.   Radiology    DG Chest Port 1 View  Result Date: 09/25/2019 CLINICAL DATA:  Shortness of breath EXAM: PORTABLE CHEST 1 VIEW COMPARISON:  September 22, 2019 FINDINGS: Endotracheal tube and nasogastric tube no longer evident. Thoracic stimulator leads in midthoracic region. There is mild bibasilar atelectasis. Lungs elsewhere are clear. The heart size and pulmonary vascularity are normal. No adenopathy. There is aortic atherosclerosis. There is a stent in the left subclavian artery. There is postoperative change in the lower cervical region. IMPRESSION: No pneumothorax. Mild bibasilar atelectasis. No edema or airspace opacity. Stable cardiac silhouette. Aortic Atherosclerosis (ICD10-I70.0). Electronically Signed   By: Lowella Grip III M.D.   On: 09/25/2019 09:41    Cardiac Studies     Patient Profile     64 y.o. male w/ a h/o PAF/flutter, CAD s/p prior LAD PCI in 2010, HTN, HL, COPD, and diabetes, who was recently admitted 9/3 w/ resp failure/COPD and mild trop elevation (nl EF by echo) and was readmitted 9/12 after being found unresponsive w/ resp failure req intubation, hypotension, AKI (creat 7.07), hyperkalemia (7.0) treated w/ dialysis, who later developed atrial flutter w/ RVR on 9/13.  Assessment & Plan    Atrial flutter with RVR Normal sinus rhythm on presentation to the hospital September 12 September 13 developed a flutter with RVR Treated with amiodarone, then transitioned to oral diltiazem and beta-blocker Back into atrial flutter with rapid rate most of yesterday, worsening overnight rate 160 this morning -No response to digoxin x2, amiodarone infusion --- Underwent cardioversion this morning successful, normal sinus rhythm restored --- We  will continue amiodarone  infusion given paroxysmal episodes Consider transitioning to oral amiodarone 400 twice daily tomorrow with 1 week load --Started bisoprolol 5 twice daily We will continue midodrine for hypotension On Xarelto  Acute on chronic hypoxic respiratory failure/COPD Admission to the hospital early September for respiratory distress, somnolence, hypoxia Readmission September 12 unresponsive again hypoxic hypotensive creatinine 7 BUN 127 Hyperkalemia, treated with IV fluids in the emergency room for concern of dehydration in the setting of hypotension,  intubated, underwent hemodialysis --- Unclear if cardiac arrhythmia above may have contributed to symptoms.  Amiodarone in an effort to suppress arrhythmia  Acute on chronic diastolic CHF High risk of CHF exacerbation in the setting of atrial flutter with RVR Component of cardiorenal syndrome, improved renal function with diuresis, held last night in the setting of hypotension -Echocardiogram reviewed personally by myself this morning showing low right ventricular systolic pressure -We will restart outpatient Lasix 20 daily  Coronary artery disease with stable angina Prior stent to the LAD 2010 Minimally elevated troponin, demand ischemia in the setting of tachyarrhythmia and respiratory distress No further ischemic work-up at this time  Acute renal failure Required hemodialysis on arrival Will restart Lasix 20 daily  Long time spent with the patient discussing risk and benefit of cardioversion, need for procedure, discussed with nursing, specials, anesthesia, orders placed  Total encounter time more than 45 minutes  Greater than 50% was spent in counseling and coordination of care with the patient    For questions or updates, please contact [place] Please consult [email or web address] for contact info under        Signed, Ida Rogue, MD  09/26/2019, 11:42 AM

## 2019-09-26 NOTE — Progress Notes (Signed)
PROGRESS NOTE    Gregory Hall   VQQ:[id number]  DOB: 01-May-1955  DOA: 09/21/2019     5  PCP: Remi Haggard, FNP  CC: found unresponsive  Hospital Course: Gregory Hall is a 64 yo CM with PMH COPD, HTN, GERD, HLD, depression/anxiety, CAD, DMII who presented to the hospital initially on 09/22/19 after being found unresponsive at a group home.  He is chronically on 3 L oxygen and was found to be hypoxic and hypotensive.  He resides at a group home.  He was intubated upon arrival to the hospital and initially required vasopressor support. Etiology of his respiratory failure was considered due to severe COPD exacerbation.  He was also evaluated by cardiology for new onset a flutter with RVR and started on a Cardizem infusion. He also developed renal failure with etiology considered due to his hypotension and possibly even his hypoxia on admission.  He required temporary dialysis and renal function slowly improved.  He was transferred out of the ICU on 09/24/2019.  He continued to have ongoing uncontrolled ventricular rates up to 130.  Cardiology continue to follow.  His blood pressure remained soft/hypotensive precluding further up titration of Cardizem.  He had also briefly been on amiodarone while in the ER.  Due to ongoing tachycardia refractory to treatment with ensuing hypotension, he underwent cardioversion with cardiology on 09/26/2019.  He was converted to NSR.  He is continued on amiodarone with addition of bisoprolol for rate control.   Interval History:  Underwent cardioversion this am with cardiology. Converted to NSR. He is feeling better when seen; slightly sore chest.  Breathing feels more comfortable. Discussed that we would have him start working with physical therapy to begin getting out of bed.  Old records reviewed in assessment of this patient  ROS: Constitutional: negative for chills and fevers, Respiratory: positive for Shortness of breath, Cardiovascular:  negative for chest pain and Gastrointestinal: negative for abdominal pain  Assessment & Plan: Acute metabolic encephalopathy - now resolved; considered multifactorial notably due to respiratory failure and renal failure   Acute on chronic respiratory failure with hypoxia (HCC) - on 3L [place] O2 at group home - s/p intubation and now has been extubated and tx'd out of ICU - continue O2 - continue breathing treatments  Atrial flutter ([place]) - with rapid rate -Has been trialed on amiodarone and Cardizem.  Had been hypotensive and unable to continue with Cardizem.  Amiodarone reinitiated per cardiology on 09/25/2019.  Digoxin also given on 09/25/2019 per cardiology. -Due to ongoing rapid rate, patient was successfully cardioverted by cardiology on 09/26/2019.  He is now in NSR.  Plan is to continue on amiodarone infusion and transition to p.o. possibly on 09/27/2019 -Continue bisoprolol per cardiology - midodrine initiated for hypotension; continue and monitor response - greatly appreciate cardiology assistance in the management of this complex patient   Goals of care, counseling/discussion -Overall he has improved from initial presentation.  Palliative care has been consulted and is following, appreciate assistance -CODE STATUS is DNR at this time; he has told palliative care he does not want to be back on a ventilator  Hyperkalemia -Now resolved.  Has responded to treatment  Acute diastolic CHF (congestive heart failure) ([place]) -Repeat echo pending to reevaluate in setting of a flutter -Cardiology following, appreciate assistance -Previous echo from 09/14/2019 revealed EF 65 to 70% with grade 2 diastolic dysfunction -On exam, he is edematous and appears volume overloaded - continue lasix, restarted per cardiology  CAD (coronary artery  disease) -Prior LAD stent in 2010 -Elevated troponin on admission considered in setting of demand ischemia especially with significant tachycardia -No further  work-up indicated per cardiology   Antimicrobials: None  DVT prophylaxis: Xarelto Code Status: DNR Family Communication: None present Disposition Plan: Status is: Inpatient  Remains inpatient appropriate because:Hemodynamically unstable, Unsafe d/c plan, IV treatments appropriate due to intensity of illness or inability to take PO and Inpatient level of care appropriate due to severity of illness   Dispo: The patient is from: Group home              Anticipated d/c is to: Group home              Anticipated d/c date is: 3 days              Patient currently is not medically stable to d/c.   Objective: Blood pressure 110/61, pulse 72, temperature 98.6 F (37 C), temperature source Oral, resp. rate 19, height 6' (1.829 m), weight 98.5 kg, SpO2 97 %.  Examination: General appearance: Pleasant appearing slightly elderly man resting in bed appearing anxious and uncomfortable but in no obvious distress Head: Normocephalic, without obvious abnormality, atraumatic Eyes: EOMI Lungs: Coarse breath sounds bilaterally with some scattered expiratory wheezing Heart: irregularly irregular rhythm and S1, S2 normal Abdomen: normal findings: bowel sounds normal and soft, non-tender Extremities: 2+ bilateral lower extremity pitting edema as well as some edema appreciated in upper extremities Skin: Scattered bruising Neurologic: Grossly normal  Consultants:   Cardiology  Nephrology  Data Reviewed: I have personally reviewed following labs and imaging studies Results for orders placed or performed during the hospital encounter of 09/21/19 (from the past 24 hour(s))  Glucose, capillary     Status: Abnormal   Collection Time: 09/25/19  9:20 PM  Result Value Ref Range   Glucose-Capillary 233 (H) 70 - 99 mg/dL  Basic metabolic panel     Status: Abnormal   Collection Time: 09/26/19  6:49 AM  Result Value Ref Range   Sodium 141 135 - 145 mmol/L   Potassium 4.0 3.5 - 5.1 mmol/L   Chloride 100  98 - 111 mmol/L   CO2 31 22 - 32 mmol/L   Glucose, Bld 130 (H) 70 - 99 mg/dL   BUN 11 8 - 23 mg/dL   Creatinine, Ser 0.93 0.61 - 1.24 mg/dL   Calcium 8.5 (L) 8.9 - 10.3 mg/dL   GFR calc non Af Amer >60 >60 mL/min   GFR calc Af Amer >60 >60 mL/min   Anion gap 10 5 - 15  CBC with Differential/Platelet     Status: Abnormal   Collection Time: 09/26/19  6:49 AM  Result Value Ref Range   WBC 8.5 4.0 - 10.5 K/uL   RBC 3.56 (L) 4.22 - 5.81 MIL/uL   Hemoglobin 10.0 (L) 13.0 - 17.0 g/dL   HCT 32.0 (L) 39 - 52 %   MCV 89.9 80.0 - 100.0 fL   MCH 28.1 26.0 - 34.0 pg   MCHC 31.3 30.0 - 36.0 g/dL   RDW 19.0 (H) 11.5 - 15.5 %   Platelets 196 150 - 400 K/uL   nRBC 0.0 0.0 - 0.2 %   Neutrophils Relative % 66 %   Neutro Abs 5.5 1.7 - 7.7 K/uL   Lymphocytes Relative 19 %   Lymphs Abs 1.6 0.7 - 4.0 K/uL   Monocytes Relative 12 %   Monocytes Absolute 1.0 0 - 1 K/uL   Eosinophils Relative 3 %  Eosinophils Absolute 0.3 0 - 0 K/uL   Basophils Relative 0 %   Basophils Absolute 0.0 0 - 0 K/uL   Immature Granulocytes 0 %   Abs Immature Granulocytes 0.03 0.00 - 0.07 K/uL  Magnesium     Status: Abnormal   Collection Time: 09/26/19  6:49 AM  Result Value Ref Range   Magnesium 1.4 (L) 1.7 - 2.4 mg/dL  Glucose, capillary     Status: Abnormal   Collection Time: 09/26/19  7:56 AM  Result Value Ref Range   Glucose-Capillary 129 (H) 70 - 99 mg/dL  Glucose, capillary     Status: Abnormal   Collection Time: 09/26/19 11:25 AM  Result Value Ref Range   Glucose-Capillary 231 (H) 70 - 99 mg/dL    Recent Results (from the past 240 hour(s))  Blood Culture (routine x 2)     Status: None   Collection Time: 09/21/19  3:25 PM   Specimen: BLOOD  Result Value Ref Range Status   Specimen Description BLOOD LEFT ARM  Final   Special Requests   Final    BOTTLES DRAWN AEROBIC AND ANAEROBIC Blood Culture adequate volume   Culture   Final    NO GROWTH 5 DAYS Performed at [facility], [address]  [place], [place] [address]    Report Status 09/26/2019 FINAL  Final  Blood Culture (routine x 2)     Status: Abnormal   Collection Time: 09/21/19  3:25 PM   Specimen: BLOOD  Result Value Ref Range Status   Specimen Description   Final    BLOOD RIGHT ARM Performed at [facility], [address] [place], [place] [address]    Special Requests   Final    BOTTLES DRAWN AEROBIC AND ANAEROBIC Blood Culture adequate volume Performed at [facility], [address] [place], [place] [address]    Culture  Setup Time   Final    Organism ID to follow [place] CRITICAL RESULT CALLED TO, READ BACK BY AND VERIFIED WITH: ABBY ELLINGTON AT 1029 09/22/19 SDR GRAM STAIN REVIEWED-AGREE WITH RESULT    Culture (A)  Final    STAPHYLOCOCCUS EPIDERMIDIS THE SIGNIFICANCE OF ISOLATING THIS ORGANISM FROM A SINGLE SET OF BLOOD CULTURES WHEN MULTIPLE SETS ARE DRAWN IS UNCERTAIN. PLEASE NOTIFY THE MICROBIOLOGY DEPARTMENT WITHIN ONE WEEK IF SPECIATION AND SENSITIVITIES ARE REQUIRED. Performed at [facility], [place] [address] [place], [place] [address]    Report Status 09/24/2019 FINAL  Final  Urine culture     Status: None   Collection Time: 09/21/19  3:25 PM   Specimen: In/Out Cath Urine  Result Value Ref Range Status   Specimen Description   Final    IN/OUT CATH URINE Performed at [facility], [address], [place], [place] [address]    Special Requests   Final    NONE Performed at [facility], [address], [place], [place] [address]    Culture   Final    NO GROWTH Performed at [facility], [place] [address], [place],  [address]    Report Status 09/22/2019 FINAL  Final  Blood Culture ID Panel (Reflexed)     Status: Abnormal   Collection Time: 09/21/19  3:25 PM  Result Value Ref Range Status   Enterococcus faecalis NOT DETECTED NOT DETECTED Final   Enterococcus Faecium NOT DETECTED NOT DETECTED Final    Listeria monocytogenes NOT DETECTED NOT DETECTED Final   Staphylococcus  species DETECTED (A) NOT DETECTED Final    Comment: CRITICAL RESULT CALLED TO, READ BACK BY AND VERIFIED WITH:  ABBY ELLINGTON AT 1029 09/22/19 SDR    Staphylococcus aureus (BCID) NOT DETECTED NOT DETECTED Final   Staphylococcus epidermidis DETECTED (A) NOT DETECTED Final    Comment: Methicillin (oxacillin) resistant coagulase negative staphylococcus. Possible blood culture contaminant (unless isolated from more than one blood culture draw or clinical case suggests pathogenicity). No antibiotic treatment is indicated for blood  culture contaminants. CRITICAL RESULT CALLED TO, READ BACK BY AND VERIFIED WITH: ABBY ELLINGTON AT 8182 09/22/19 SDR    Staphylococcus lugdunensis NOT DETECTED NOT DETECTED Final   Streptococcus species NOT DETECTED NOT DETECTED Final   Streptococcus agalactiae NOT DETECTED NOT DETECTED Final   Streptococcus pneumoniae NOT DETECTED NOT DETECTED Final   Streptococcus pyogenes NOT DETECTED NOT DETECTED Final   A.calcoaceticus-baumannii NOT DETECTED NOT DETECTED Final   Bacteroides fragilis NOT DETECTED NOT DETECTED Final   Enterobacterales NOT DETECTED NOT DETECTED Final   Enterobacter cloacae complex NOT DETECTED NOT DETECTED Final   Escherichia coli NOT DETECTED NOT DETECTED Final   Klebsiella aerogenes NOT DETECTED NOT DETECTED Final   Klebsiella oxytoca NOT DETECTED NOT DETECTED Final   Klebsiella pneumoniae NOT DETECTED NOT DETECTED Final   Proteus species NOT DETECTED NOT DETECTED Final   Salmonella species NOT DETECTED NOT DETECTED Final   Serratia marcescens NOT DETECTED NOT DETECTED Final   Haemophilus influenzae NOT DETECTED NOT DETECTED Final   Neisseria meningitidis NOT DETECTED NOT DETECTED Final   Pseudomonas aeruginosa NOT DETECTED NOT DETECTED Final   Stenotrophomonas maltophilia NOT DETECTED NOT DETECTED Final   Candida albicans NOT DETECTED NOT DETECTED Final   Candida auris  NOT DETECTED NOT DETECTED Final   Candida glabrata NOT DETECTED NOT DETECTED Final   Candida krusei NOT DETECTED NOT DETECTED Final   Candida parapsilosis NOT DETECTED NOT DETECTED Final   Candida tropicalis NOT DETECTED NOT DETECTED Final   Cryptococcus neoformans/gattii NOT DETECTED NOT DETECTED Final   Methicillin resistance mecA/C DETECTED (A) NOT DETECTED Final    Comment: CRITICAL RESULT CALLED TO, READ BACK BY AND VERIFIED WITH:  ABBY ELLINGTON AT 9937 09/22/19 SDR Performed at [facility], [address] [place], [place] [address]   SARS Coronavirus 2 by RT PCR (hospital order, performed in [place] hospital lab) Nasopharyngeal Nasopharyngeal Swab     Status: None   Collection Time: 09/21/19  3:28 PM   Specimen: Nasopharyngeal Swab  Result Value Ref Range Status   SARS Coronavirus 2 NEGATIVE NEGATIVE Final    Comment: (NOTE) SARS-CoV-2 target nucleic acids are NOT DETECTED.  The SARS-CoV-2 RNA is generally detectable in upper and lower respiratory specimens during the acute phase of infection. The lowest concentration of SARS-CoV-2 viral copies this assay can detect is 250 copies / mL. A negative result does not preclude SARS-CoV-2 infection and should not be used as the sole basis for treatment or other patient management decisions.  A negative result may occur with improper specimen collection / handling, submission of specimen other than nasopharyngeal swab, presence of viral mutation(s) within the areas targeted by this assay, and inadequate number of viral copies (<250 copies / mL). A negative result must be combined with clinical observations, patient history, and epidemiological information.  Fact Sheet for Patients:   StrictlyIdeas.no  Fact Sheet for Healthcare Providers: BankingDealers.co.za  This test is not yet approved or  cleared by the [place] FDA and has been authorized for  detection and/or  diagnosis of SARS-CoV-2 by FDA under an Emergency Use Authorization (EUA).  This EUA will remain in effect (meaning this test can be used) for the duration of the COVID-19 declaration under Section 564(b)(1) of the Act, 21 U.S.C. section 360bbb-3(b)(1), unless the authorization is terminated or revoked sooner.  Performed at [facility], [address] [place], [place] [address]   MRSA PCR Screening     Status: None   Collection Time: 09/21/19 10:16 PM   Specimen: Nasal Mucosa; Nasopharyngeal  Result Value Ref Range Status   MRSA by PCR NEGATIVE NEGATIVE Final    Comment:        The GeneXpert MRSA Assay (FDA approved for NASAL specimens only), is one component of a comprehensive MRSA colonization surveillance program. It is not intended to diagnose MRSA infection nor to guide or monitor treatment for MRSA infections. Performed at [facility], [address], [place], [place] [address]      Radiology Studies: DG Chest Worth 1 View  Result Date: 09/25/2019 CLINICAL DATA:  Shortness of breath EXAM: PORTABLE CHEST 1 VIEW COMPARISON:  September 22, 2019 FINDINGS: Endotracheal tube and nasogastric tube no longer evident. Thoracic stimulator leads in midthoracic region. There is mild bibasilar atelectasis. Lungs elsewhere are clear. The heart size and pulmonary vascularity are normal. No adenopathy. There is aortic atherosclerosis. There is a stent in the left subclavian artery. There is postoperative change in the lower cervical region. IMPRESSION: No pneumothorax. Mild bibasilar atelectasis. No edema or airspace opacity. Stable cardiac silhouette. Aortic Atherosclerosis (ICD10-I70.0). Electronically Signed   By: Lowella Grip III M.D.   On: 09/25/2019 09:41   ECHOCARDIOGRAM LIMITED  Result Date: 09/26/2019    ECHOCARDIOGRAM LIMITED REPORT   Patient Name:   PARKER WHERLEY Date of Exam: 09/26/2019 Medical Rec #:  [id number]             Height:       72.0 in  Accession #:    [id number]            Weight:       217.1 lb Date of Birth:  [birth year]             BSA:          2.206 m Patient Age:    71 years              BP:           113/63 mmHg Patient Gender: M                     HR:           82 bpm. Exam Location:  [facility] Procedure: Limited Echo, Cardiac Doppler and Color Doppler Indications:     Atrial Flutter 427.32  History:         Patient has prior history of Echocardiogram examinations, most                  recent 09/14/2019. COPD, Signs/Symptoms:Chest Pain; Risk                  Factors:Hypertension and Diabetes.  Sonographer:     Sherrie Sport RDCS (AE) Referring Phys:  [place] Diagnosing Phys: Ida Rogue MD  Sonographer Comments: No apical window. Image acquisition challenging due to COPD. IMPRESSIONS  1. Left ventricular ejection fraction, by estimation, is 55 to 60%. The left ventricle has normal function. The left ventricle has no  regional wall motion abnormalities. Indeterminate diastolic filling due to E-A fusion.  2. Right ventricular systolic function is normal. The right ventricular size is normal. There is normal pulmonary artery systolic pressure.  3. The mitral valve was not well visualized. No evidence of mitral valve regurgitation. No evidence of mitral stenosis.  4. The aortic valve was not well visualized. Aortic valve regurgitation is not visualized. Mild aortic valve sclerosis is present, with no evidence of aortic valve stenosis.  5. Normal sinus rhythm. FINDINGS  Left Ventricle: Left ventricular ejection fraction, by estimation, is 55 to 60%. The left ventricle has normal function. The left ventricle has no regional wall motion abnormalities. The left ventricular internal cavity size was normal in size. There is  no left ventricular hypertrophy. Indeterminate diastolic filling due to E-A fusion. Right Ventricle: The right ventricular size is normal. No increase in right ventricular wall thickness. Right ventricular  systolic function is normal. There is normal pulmonary artery systolic pressure. The tricuspid regurgitant velocity is 2.00 m/s, and  with an assumed right atrial pressure of 5 mmHg, the estimated right ventricular systolic pressure is 44.3 mmHg. Left Atrium: Left atrial size was normal in size. Right Atrium: Right atrial size was normal in size. Pericardium: There is no evidence of pericardial effusion. Mitral Valve: The mitral valve was not well visualized. No evidence of mitral valve stenosis. Tricuspid Valve: The tricuspid valve is not well visualized. Tricuspid valve regurgitation is trivial. No evidence of tricuspid stenosis. Aortic Valve: The aortic valve was not well visualized. Aortic valve regurgitation is not visualized. Mild aortic valve sclerosis is present, with no evidence of aortic valve stenosis. Pulmonic Valve: The pulmonic valve was not well visualized. Pulmonic valve regurgitation is not visualized. Aorta: The aortic root was not well visualized, the ascending aorta was not well visualized and the aortic arch was not well visualized. Venous: The pulmonary veins were not well visualized. LEFT VENTRICLE PLAX 2D LVIDd:         4.13 cm LVIDs:         2.63 cm LV PW:         1.42 cm LV IVS:        1.11 cm LVOT diam:     2.00 cm LVOT Area:     3.14 cm  LEFT ATRIUM         Index LA diam:    4.00 cm 1.81 cm/m   AORTA Ao Root diam: 3.50 cm TRICUSPID VALVE TR Peak grad:   16.0 mmHg TR Vmax:        200.00 cm/s  SHUNTS Systemic Diam: 2.00 cm Ida Rogue MD Electronically signed by Ida Rogue MD Signature Date/Time: 09/26/2019/1:34:59 PM    Final (Updated)    DG Chest Port 1 View  Final Result    US RENAL  Final Result    DG Chest [facility] 1 View  Final Result    DG Chest Portable 1 View  Final Result    DG Chest Port 1 View  Final Result      Scheduled Meds: . atorvastatin  40 mg Oral Daily  . bisoprolol  5 mg Oral BID  . chlorhexidine gluconate (MEDLINE KIT)  15 mL Mouth Rinse BID   . feeding supplement (NEPRO CARB STEADY)  237 mL Oral BID BM  . insulin aspart  0-5 Units Subcutaneous QHS  . insulin aspart  0-9 Units Subcutaneous TID WC  . ipratropium-albuterol  3 mL Nebulization Q6H  . midodrine  10 mg Oral  Q8H  . nicotine  21 mg Transdermal Daily  . pantoprazole  40 mg Oral QHS  . pregabalin  200 mg Oral TID  . rivaroxaban  20 mg Oral Q supper   PRN Meds: albuterol, ALPRAZolam, alteplase, docusate, melatonin, morphine injection, ondansetron (ZOFRAN) IV, polyethylene glycol, sodium chloride flush Continuous Infusions: . sodium chloride 0 mL (09/21/19 1624)  . amiodarone 30 mg/hr (09/26/19 0740)  . magnesium sulfate bolus IVPB        LOS: 5 days  Time spent: Greater than 50% of the 35 minute visit was spent in counseling/coordination of care for the patient as laid out in the A&P.   Dwyane Dee, MD [facility] 09/26/2019, 4:43 PM  Contact via secure chat.  To contact the attending provider between 7A-7P or the covering provider during after hours 7P-7A, please log into the web site [email or web address] and access using universal [place] password for that web site. If you do not have the password, please call the hospital operator.

## 2019-09-26 NOTE — Progress Notes (Signed)
*  PRELIMINARY RESULTS* Echocardiogram 2D Echocardiogram has been performed.  Cristela Blue 09/26/2019, 10:46 AM

## 2019-09-26 NOTE — Anesthesia Postprocedure Evaluation (Signed)
Anesthesia Post Note  Patient: Gregory Hall  Procedure(s) Performed: CARDIOVERSION (N/A )  Patient location during evaluation: Specials Recovery Anesthesia Type: General Level of consciousness: awake and alert Pain management: pain level controlled Vital Signs Assessment: post-procedure vital signs reviewed and stable Respiratory status: spontaneous breathing, nonlabored ventilation, respiratory function stable and patient connected to nasal cannula oxygen Cardiovascular status: blood pressure returned to baseline and stable Postop Assessment: no apparent nausea or vomiting Anesthetic complications: no   No complications documented.   Last Vitals:  Vitals:   09/26/19 0845 09/26/19 0857  BP: 132/68 113/63  Pulse: 85 82  Resp: 17 18  Temp:    SpO2:  99%    Last Pain:  Vitals:   09/26/19 0857  TempSrc:   PainSc: 8                  Corinda Gubler

## 2019-09-26 NOTE — Evaluation (Signed)
Physical Therapy Evaluation Patient Details Name: Gregory Hall MRN: [id number] DOB: 12/13/1955 Today's Date: 09/26/2019   History of Present Illness  64 yo CM with PMH COPD, HTN, GERD, HLD, depression/anxiety, CAD, DMII who presented to the hospital initially on 09/22/19 after being found unresponsive at a group home.  He is chronically on 3 L oxygen and was found to be hypoxic and hypotensive.  He was intubated upon arrival to the hospital and initially required vasopressor support.  Also having new RVR with cardioversion 9/17 AM.  Clinical Impression  Pt tired and truly did not feel like doing a lot, but he was willing to do some mobility tasks including transfer to/from chair.  He states that at his group home he gets in/out of w/c w/o issue and is able to be relatively independent with all tasks the facility does not take care of.  Overall he is weaker than his baseline having been in bed for the last few days but he was able to show safety and confidence with basic transfers.  He would benefit from PT at his group home.    Follow Up Recommendations Home health PT    Equipment Recommendations  None recommended by PT    Recommendations for Other Services       Precautions / Restrictions Precautions Precautions: Fall Restrictions Weight Bearing Restrictions: No      Mobility  Bed Mobility Overal bed mobility: Modified Independent             General bed mobility comments: Pt able to get himself to sitting w/o direct assist  Transfers Overall transfer level: Modified independent Equipment used: None Transfers: Squat Pivot Transfers     Squat pivot transfers: Supervision     General transfer comment: Pt was able to easily and confidently sit-pivot to/from bed/chair t/o cuing or assist.  Ambulation/Gait             General Gait Details: Pt does little to no ambulation at baseline, feels too tired to try any standing/gait today  Stairs             Wheelchair Mobility    Modified Rankin (Stroke Patients Only)       Balance Overall balance assessment: Modified Independent (good EOB sitting balance, good control during SqPT)                                           Pertinent Vitals/Pain Pain Assessment: 0-10 Pain Score: 7  Pain Location: chronic back pain    Home Living Family/patient expects to be discharged to:: Group home                      Prior Function Level of Independence: Independent with assistive device(s)         Comments: Pt reports he is generally independent with ADL management. He has not walked more than a few steps in >1 year, but is able to stand pivot to/from his WC w/o assist. Manages dressing and bathing tasks independently. ALF staff support meal prep, medication management, and can provide physical assist PRN.     Hand Dominance        Extremity/Trunk Assessment   Upper Extremity Assessment Upper Extremity Assessment: Overall WFL for tasks assessed;Generalized weakness    Lower Extremity Assessment Lower Extremity Assessment: Overall WFL for tasks assessed;Generalized weakness  Communication   Communication: No difficulties  Cognition Arousal/Alertness: Awake/alert Behavior During Therapy: WFL for tasks assessed/performed Overall Cognitive Status: Within Functional Limits for tasks assessed                                        General Comments      Exercises     Assessment/Plan    PT Assessment Patient needs continued PT services  PT Problem List Decreased strength;Decreased activity tolerance;Decreased balance;Decreased range of motion;Decreased mobility;Decreased safety awareness;Pain       PT Treatment Interventions DME instruction;Gait training;Functional mobility training;Therapeutic activities;Therapeutic exercise;Balance training;Neuromuscular re-education;Patient/family education    PT Goals (Current goals  can be found in the Care Plan section)  Acute Rehab PT Goals Patient Stated Goal: go back to his group home PT Goal Formulation: With patient Time For Goal Achievement: 10/10/19 Potential to Achieve Goals: Fair    Frequency Min 2X/week   Barriers to discharge        Co-evaluation               AM-PAC PT "6 Clicks" Mobility  Outcome Measure Help needed turning from your back to your side while in a flat bed without using bedrails?: None Help needed moving from lying on your back to sitting on the side of a flat bed without using bedrails?: None Help needed moving to and from a bed to a chair (including a wheelchair)?: None Help needed standing up from a chair using your arms (e.g., wheelchair or bedside chair)?: A Little Help needed to walk in hospital room?: A Lot Help needed climbing 3-5 steps with a railing? : Total 6 Click Score: 18    End of Session Equipment Utilized During Treatment: Gait belt;Oxygen (3L) Activity Tolerance: Patient tolerated treatment well;Patient limited by fatigue Patient left: with call bell/phone within reach;with bed alarm set Nurse Communication: Mobility status PT Visit Diagnosis: Muscle weakness (generalized) (M62.81);Difficulty in walking, not elsewhere classified (R26.2)    Time: 6578-4696 PT Time Calculation (min) (ACUTE ONLY): 15 min   Charges:   PT Evaluation $PT Eval Low Complexity: 1 Low          Malachi Pro, DPT 09/26/2019, 1:20 PM

## 2019-09-26 NOTE — Progress Notes (Signed)
   09/26/19 0400  Assess: MEWS Score  Temp 98.7 F (37.1 C)  BP 108/73  Pulse Rate (!) 157  ECG Heart Rate (!) 151  Resp 16  SpO2 91 %  O2 Device Nasal Cannula  O2 Flow Rate (L/min) 3 L/min  Assess: MEWS Score  MEWS Temp 0  MEWS Systolic 0  MEWS Pulse 3  MEWS RR 0  MEWS LOC 0  MEWS Score 3  MEWS Score Color Yellow  Assess: if the MEWS score is Yellow or Red  Were vital signs taken at a resting state? Yes  Focused Assessment No change from prior assessment  Early Detection of Sepsis Score *See Row Information* Medium  MEWS guidelines implemented *See Row Information* No, previously yellow, continue vital signs every 4 hours  Treat  MEWS Interventions Escalated (See documentation below)  Escalate  MEWS: Escalate Yellow: discuss with charge nurse/RN and consider discussing with provider and RRT  Notify: Charge Nurse/RN  Name of Charge Nurse/RN Notified Alisa RN  Date Charge Nurse/RN Notified 09/26/19  Time Charge Nurse/RN Notified 0400  Notify: Provider  Provider Name/Title Para March  Date Provider Notified 09/26/19  Time Provider Notified 0330  Notification Type Page  Notification Reason Other (Comment) (elevated HR sustaining 140s-150s)  Response No new orders  Document  Patient Outcome Not stable and remains on department;Other (Comment) (cardioversion pending 09/17)  Progress note created (see row info) Yes

## 2019-09-26 NOTE — CV Procedure (Signed)
Cardioversion procedure note For atrial flutter with RVR  Procedure Details:  Consent: Risks of procedure as well as the alternatives and risks of each were explained to the (patient/caregiver). Consent for procedure obtained.  Time Out: Verified patient identification, verified procedure, site/side was marked, verified correct patient position, special equipment/implants available, medications/allergies/relevent history reviewed, required imaging and test results available. Performed  Patient placed on cardiac monitor, pulse oximetry, supplemental oxygen as necessary.  Sedation given: propofol IV, Dr. Suzan Slick Pacer pads placed anterior and posterior chest.   Cardioverted 1 time(s).  Cardioverted at 120  J. Synchronized biphasic Converted to NSR   Evaluation: Findings: Post procedure EKG shows: NSR Complications: None Patient did tolerate procedure well.  Time Spent Directly with the Patient:  45 minutes   Dossie Arbour, M.D., Ph.D.

## 2019-09-26 NOTE — Progress Notes (Signed)
Received report from [facility] after completion of echo. He reported heart function looks appropriate. Results will be reviewed by MD.  Patient is resting in bed currently with 2 pillows under each arm to help decrease edema. I ordered his breakfast when he returned from cardioversion. Also gave PRN Xanax per his request. Patient states he is tired and wants to rest. I will continue to assess.

## 2019-09-27 DIAGNOSIS — I5031 Acute diastolic (congestive) heart failure: Secondary | ICD-10-CM

## 2019-09-27 DIAGNOSIS — J449 Chronic obstructive pulmonary disease, unspecified: Secondary | ICD-10-CM

## 2019-09-27 LAB — GLUCOSE, CAPILLARY
Glucose-Capillary: 168 mg/dL — ABNORMAL HIGH (ref 70–99)
Glucose-Capillary: 205 mg/dL — ABNORMAL HIGH (ref 70–99)
Glucose-Capillary: 168 mg/dL — ABNORMAL HIGH (ref 70–99)
Glucose-Capillary: 214 mg/dL — ABNORMAL HIGH (ref 70–99)

## 2019-09-27 LAB — CBC WITH DIFFERENTIAL/PLATELET
Abs Immature Granulocytes: 0.05 10*3/uL (ref 0.00–0.07)
Basophils Absolute: 0 10*3/uL (ref 0.0–0.1)
Basophils Relative: 0 %
Eosinophils Absolute: 0.4 10*3/uL (ref 0.0–0.5)
Eosinophils Relative: 4 %
HCT: 31.2 % — ABNORMAL LOW (ref 39.0–52.0)
Hemoglobin: 10.1 g/dL — ABNORMAL LOW (ref 13.0–17.0)
Immature Granulocytes: 1 %
Lymphocytes Relative: 15 %
Lymphs Abs: 1.4 10*3/uL (ref 0.7–4.0)
MCH: 28.1 pg (ref 26.0–34.0)
MCHC: 32.4 g/dL (ref 30.0–36.0)
MCV: 86.9 fL (ref 80.0–100.0)
Monocytes Absolute: 0.8 10*3/uL (ref 0.1–1.0)
Monocytes Relative: 9 %
Neutro Abs: 6.4 10*3/uL (ref 1.7–7.7)
Neutrophils Relative %: 71 %
Platelets: 207 10*3/uL (ref 150–400)
RBC: 3.59 MIL/uL — ABNORMAL LOW (ref 4.22–5.81)
RDW: 18.8 % — ABNORMAL HIGH (ref 11.5–15.5)
WBC: 9.1 10*3/uL (ref 4.0–10.5)
nRBC: 0 % (ref 0.0–0.2)

## 2019-09-27 LAB — BASIC METABOLIC PANEL
Anion gap: 10 (ref 5–15)
BUN: 10 mg/dL (ref 8–23)
CO2: 32 mmol/L (ref 22–32)
Calcium: 8.6 mg/dL — ABNORMAL LOW (ref 8.9–10.3)
Chloride: 98 mmol/L (ref 98–111)
Creatinine, Ser: 0.91 mg/dL (ref 0.61–1.24)
GFR calc Af Amer: >60 mL/min (ref >60)
GFR calc non Af Amer: >60 mL/min (ref >60)
Glucose, Bld: 166 mg/dL — ABNORMAL HIGH (ref 70–99)
Potassium: 4 mmol/L (ref 3.5–5.1)
Sodium: 140 mmol/L (ref 135–145)

## 2019-09-27 LAB — MAGNESIUM: Magnesium: 1.6 mg/dL — ABNORMAL LOW (ref 1.7–2.4)

## 2019-09-27 MED ORDER — OXYCODONE HCL 5 MG PO TABS
10.0000 mg | ORAL_TABLET | Freq: Three times a day (TID) | ORAL | Status: DC | PRN
  Administered 2019-09-27 – 2019-09-28 (×4): 10 mg via ORAL
  Filled 2019-09-27 (×4): qty 2

## 2019-09-27 MED ORDER — POLYVINYL ALCOHOL 1.4 % OP SOLN
2.0000 [drp] | OPHTHALMIC | Status: DC | PRN
  Administered 2019-09-27 – 2019-09-28 (×5): 2 [drp] via OPHTHALMIC
  Filled 2019-09-27: qty 15

## 2019-09-27 MED ORDER — IPRATROPIUM-ALBUTEROL 0.5-2.5 (3) MG/3ML IN SOLN
3.0000 mL | Freq: Three times a day (TID) | RESPIRATORY_TRACT | Status: DC
  Administered 2019-09-27 – 2019-09-28 (×3): 3 mL via RESPIRATORY_TRACT
  Filled 2019-09-27 (×4): qty 3

## 2019-09-27 MED ORDER — MAGNESIUM OXIDE 400 (241.3 MG) MG PO TABS
800.0000 mg | ORAL_TABLET | Freq: Once | ORAL | Status: AC
  Administered 2019-09-27: 800 mg via ORAL
  Filled 2019-09-27: qty 2

## 2019-09-27 MED ORDER — AMIODARONE HCL 200 MG PO TABS
400.0000 mg | ORAL_TABLET | Freq: Two times a day (BID) | ORAL | Status: DC
  Administered 2019-09-27 – 2019-09-28 (×3): 400 mg via ORAL
  Filled 2019-09-27 (×3): qty 2

## 2019-09-27 NOTE — Plan of Care (Signed)
  Problem: Health Behavior/Discharge Planning: Goal: Ability to manage health-related needs will improve Outcome: Progressing   Problem: Clinical Measurements: Goal: Ability to maintain clinical measurements within normal limits will improve Outcome: Progressing   Problem: Pain Managment: Goal: General experience of comfort will improve Outcome: Progressing   Problem: Safety: Goal: Ability to remain free from injury will improve Outcome: Progressing   

## 2019-09-27 NOTE — Progress Notes (Signed)
PROGRESS NOTE    Gregory Hall   ASN:[id number]  DOB: Sep 09, 1955  DOA: 09/21/2019     6  PCP: Remi Haggard, FNP  CC: found unresponsive  Hospital Course: Gregory Hall is a 64 yo CM with PMH COPD, HTN, GERD, HLD, depression/anxiety, CAD, DMII who presented to the hospital initially on 09/22/19 after being found unresponsive at a group home.  He is chronically on 3 L oxygen and was found to be hypoxic and hypotensive.  He resides at a group home.  He was intubated upon arrival to the hospital and initially required vasopressor support. Etiology of his respiratory failure was considered due to severe COPD exacerbation.  He was also evaluated by cardiology for new onset a flutter with RVR and started on a Cardizem infusion. He also developed renal failure with etiology considered due to his hypotension and possibly even his hypoxia on admission.  He required temporary dialysis and renal function slowly improved.  He was transferred out of the ICU on 09/24/2019.  He continued to have ongoing uncontrolled ventricular rates up to 130.  Cardiology continue to follow.  His blood pressure remained soft/hypotensive precluding further up titration of Cardizem.  He had also briefly been on amiodarone while in the ER.  Due to ongoing tachycardia refractory to treatment with ensuing hypotension, he underwent cardioversion with cardiology on 09/26/2019.  He was converted to NSR.  He is continued on amiodarone with addition of bisoprolol for rate control.   Interval History:  Remains in NSR s/p cardioversion on 9/17. Feels good today with no CP nor SOB. Has been OOB to chair with PT and starting to overall normalize some back to his baseline.   Old records reviewed in assessment of this patient  ROS: Constitutional: negative for chills and fevers, Respiratory: positive for Shortness of breath, Cardiovascular: negative for chest pain and Gastrointestinal: negative for abdominal pain  Assessment &  Plan: Acute metabolic encephalopathy - now resolved; considered multifactorial notably due to respiratory failure and renal failure   Acute on chronic respiratory failure with hypoxia (HCC) - on 3L [place] O2 at group home - s/p intubation and now has been extubated and tx'd out of ICU - continue O2 - continue breathing treatments  Atrial flutter ([place]) - with rapid rate -Has been trialed on amiodarone and Cardizem.  Had been hypotensive and unable to continue with Cardizem.  Amiodarone reinitiated per cardiology on 09/25/2019.  Digoxin also given on 09/25/2019 per cardiology. -Due to ongoing rapid rate, patient was successfully cardioverted by cardiology on 09/26/2019.  He is now in NSR.  - amio gtt changed to PO 410m BID x 1 week then 2017mBID after -Continue bisoprolol per cardiology - midodrine initiated for hypotension; continue and monitor response - greatly appreciate cardiology assistance in the management of this complex patient   Goals of care, counseling/discussion -Overall he has improved from initial presentation.  Palliative care has been consulted and is following, appreciate assistance -CODE STATUS is DNR at this time; he has told palliative care he does not want to be back on a ventilator  Hyperkalemia -Now resolved.  Has responded to treatment  Acute diastolic CHF (congestive heart failure) ([place]-Repeat echo pending to reevaluate in setting of a flutter -Cardiology following, appreciate assistance -Previous echo from 09/14/2019 revealed EF 65 to 70% with grade 2 diastolic dysfunction -On exam, he is edematous and appears volume overloaded - continue lasix, restarted per cardiology  CAD (coronary artery disease) -Prior LAD stent in 2010 -  Elevated troponin on admission considered in setting of demand ischemia especially with significant tachycardia -No further work-up indicated per cardiology   Antimicrobials: None  DVT prophylaxis: Xarelto Code Status: DNR Family  Communication: None present Disposition Plan: Status is: Inpatient  Remains inpatient appropriate because:Hemodynamically unstable, Unsafe d/c plan, IV treatments appropriate due to intensity of illness or inability to take PO and Inpatient level of care appropriate due to severity of illness   Dispo: The patient is from: Group home              Anticipated d/c is to: Group home              Anticipated d/c date is: Monday               Patient currently is not medically stable to d/c.   Objective: Blood pressure (!) 107/54, pulse 76, temperature 97.8 F (36.6 C), temperature source Oral, resp. rate 14, height 6' (1.829 m), weight 95.1 kg, SpO2 96 %.  Examination: General appearance: alert, cooperative and no distress Head: Normocephalic, without obvious abnormality, atraumatic Eyes: EOMI Lungs: improved coarse sounds, no wheezing appreciated Heart: irregularly irregular rhythm and S1, S2 normal Abdomen: normal findings: bowel sounds normal and soft, non-tender Extremities: 2+ bilateral lower extremity pitting edema as well as some edema appreciated in upper extremities Skin: Scattered bruising Neurologic: Grossly normal  Consultants:   Cardiology  Nephrology  Data Reviewed: I have personally reviewed following labs and imaging studies Results for orders placed or performed during the hospital encounter of 09/21/19 (from the past 24 hour(s))  Glucose, capillary     Status: Abnormal   Collection Time: 09/26/19  5:21 PM  Result Value Ref Range   Glucose-Capillary 230 (H) 70 - 99 mg/dL  Glucose, capillary     Status: Abnormal   Collection Time: 09/26/19  9:07 PM  Result Value Ref Range   Glucose-Capillary 190 (H) 70 - 99 mg/dL  Basic metabolic panel     Status: Abnormal   Collection Time: 09/27/19  6:01 AM  Result Value Ref Range   Sodium 140 135 - 145 mmol/L   Potassium 4.0 3.5 - 5.1 mmol/L   Chloride 98 98 - 111 mmol/L   CO2 32 22 - 32 mmol/L   Glucose, Bld 166 (H) 70  - 99 mg/dL   BUN 10 8 - 23 mg/dL   Creatinine, Ser 0.91 0.61 - 1.24 mg/dL   Calcium 8.6 (L) 8.9 - 10.3 mg/dL   GFR calc non Af Amer >60 >60 mL/min   GFR calc Af Amer >60 >60 mL/min   Anion gap 10 5 - 15  CBC with Differential/Platelet     Status: Abnormal   Collection Time: 09/27/19  6:01 AM  Result Value Ref Range   WBC 9.1 4.0 - 10.5 K/uL   RBC 3.59 (L) 4.22 - 5.81 MIL/uL   Hemoglobin 10.1 (L) 13.0 - 17.0 g/dL   HCT 31.2 (L) 39 - 52 %   MCV 86.9 80.0 - 100.0 fL   MCH 28.1 26.0 - 34.0 pg   MCHC 32.4 30.0 - 36.0 g/dL   RDW 18.8 (H) 11.5 - 15.5 %   Platelets 207 150 - 400 K/uL   nRBC 0.0 0.0 - 0.2 %   Neutrophils Relative % 71 %   Neutro Abs 6.4 1.7 - 7.7 K/uL   Lymphocytes Relative 15 %   Lymphs Abs 1.4 0.7 - 4.0 K/uL   Monocytes Relative 9 %   Monocytes Absolute  0.8 0 - 1 K/uL   Eosinophils Relative 4 %   Eosinophils Absolute 0.4 0 - 0 K/uL   Basophils Relative 0 %   Basophils Absolute 0.0 0 - 0 K/uL   Immature Granulocytes 1 %   Abs Immature Granulocytes 0.05 0.00 - 0.07 K/uL  Magnesium     Status: Abnormal   Collection Time: 09/27/19  6:01 AM  Result Value Ref Range   Magnesium 1.6 (L) 1.7 - 2.4 mg/dL  Glucose, capillary     Status: Abnormal   Collection Time: 09/27/19  8:56 AM  Result Value Ref Range   Glucose-Capillary 168 (H) 70 - 99 mg/dL  Glucose, capillary     Status: Abnormal   Collection Time: 09/27/19 12:20 PM  Result Value Ref Range   Glucose-Capillary 205 (H) 70 - 99 mg/dL    Recent Results (from the past 240 hour(s))  Blood Culture (routine x 2)     Status: None   Collection Time: 09/21/19  3:25 PM   Specimen: BLOOD  Result Value Ref Range Status   Specimen Description BLOOD LEFT ARM  Final   Special Requests   Final    BOTTLES DRAWN AEROBIC AND ANAEROBIC Blood Culture adequate volume   Culture   Final    NO GROWTH 5 DAYS Performed at [facility], [address], [place], [place] [address]    Report Status 09/26/2019 FINAL  Final   Blood Culture (routine x 2)     Status: Abnormal   Collection Time: 09/21/19  3:25 PM   Specimen: BLOOD  Result Value Ref Range Status   Specimen Description   Final    BLOOD RIGHT ARM Performed at [facility], [address], [place], [place] [address]    Special Requests   Final    BOTTLES DRAWN AEROBIC AND ANAEROBIC Blood Culture adequate volume Performed at [facility], [address] [place], [place] [address]    Culture  Setup Time   Final    Organism ID to follow [place] CRITICAL RESULT CALLED TO, READ BACK BY AND VERIFIED WITH: ABBY ELLINGTON AT 1029 09/22/19 SDR GRAM STAIN REVIEWED-AGREE WITH RESULT    Culture (A)  Final    STAPHYLOCOCCUS EPIDERMIDIS THE SIGNIFICANCE OF ISOLATING THIS ORGANISM FROM A SINGLE SET OF BLOOD CULTURES WHEN MULTIPLE SETS ARE DRAWN IS UNCERTAIN. PLEASE NOTIFY THE MICROBIOLOGY DEPARTMENT WITHIN ONE WEEK IF SPECIATION AND SENSITIVITIES ARE REQUIRED. Performed at [facility], [place] [address] [place], [place] [address]    Report Status 09/24/2019 FINAL  Final  Urine culture     Status: None   Collection Time: 09/21/19  3:25 PM   Specimen: In/Out Cath Urine  Result Value Ref Range Status   Specimen Description   Final    IN/OUT CATH URINE Performed at [facility], [address], [place], [place] [address]    Special Requests   Final    NONE Performed at [facility], [address], [place], [place] [address]    Culture   Final    NO GROWTH Performed at [facility], [place] [address] [place],  [address]    Report Status 09/22/2019 FINAL  Final  Blood Culture ID Panel (Reflexed)     Status: Abnormal   Collection Time: 09/21/19  3:25 PM  Result Value Ref Range Status   Enterococcus faecalis NOT DETECTED NOT DETECTED Final   Enterococcus Faecium NOT DETECTED NOT DETECTED Final  Listeria monocytogenes NOT DETECTED NOT DETECTED Final    Staphylococcus species DETECTED (A) NOT DETECTED Final    Comment: CRITICAL RESULT CALLED TO, READ BACK BY AND VERIFIED WITH:  ABBY ELLINGTON AT 1029 09/22/19 SDR    Staphylococcus aureus (BCID) NOT DETECTED NOT DETECTED Final   Staphylococcus epidermidis DETECTED (A) NOT DETECTED Final    Comment: Methicillin (oxacillin) resistant coagulase negative staphylococcus. Possible blood culture contaminant (unless isolated from more than one blood culture draw or clinical case suggests pathogenicity). No antibiotic treatment is indicated for blood  culture contaminants. CRITICAL RESULT CALLED TO, READ BACK BY AND VERIFIED WITH: ABBY ELLINGTON AT 7829 09/22/19 SDR    Staphylococcus lugdunensis NOT DETECTED NOT DETECTED Final   Streptococcus species NOT DETECTED NOT DETECTED Final   Streptococcus agalactiae NOT DETECTED NOT DETECTED Final   Streptococcus pneumoniae NOT DETECTED NOT DETECTED Final   Streptococcus pyogenes NOT DETECTED NOT DETECTED Final   A.calcoaceticus-baumannii NOT DETECTED NOT DETECTED Final   Bacteroides fragilis NOT DETECTED NOT DETECTED Final   Enterobacterales NOT DETECTED NOT DETECTED Final   Enterobacter cloacae complex NOT DETECTED NOT DETECTED Final   Escherichia coli NOT DETECTED NOT DETECTED Final   Klebsiella aerogenes NOT DETECTED NOT DETECTED Final   Klebsiella oxytoca NOT DETECTED NOT DETECTED Final   Klebsiella pneumoniae NOT DETECTED NOT DETECTED Final   Proteus species NOT DETECTED NOT DETECTED Final   Salmonella species NOT DETECTED NOT DETECTED Final   Serratia marcescens NOT DETECTED NOT DETECTED Final   Haemophilus influenzae NOT DETECTED NOT DETECTED Final   Neisseria meningitidis NOT DETECTED NOT DETECTED Final   Pseudomonas aeruginosa NOT DETECTED NOT DETECTED Final   Stenotrophomonas maltophilia NOT DETECTED NOT DETECTED Final   Candida albicans NOT DETECTED NOT DETECTED Final   Candida auris NOT DETECTED NOT DETECTED Final   Candida glabrata NOT  DETECTED NOT DETECTED Final   Candida krusei NOT DETECTED NOT DETECTED Final   Candida parapsilosis NOT DETECTED NOT DETECTED Final   Candida tropicalis NOT DETECTED NOT DETECTED Final   Cryptococcus neoformans/gattii NOT DETECTED NOT DETECTED Final   Methicillin resistance mecA/C DETECTED (A) NOT DETECTED Final    Comment: CRITICAL RESULT CALLED TO, READ BACK BY AND VERIFIED WITH:  ABBY ELLINGTON AT 5621 09/22/19 SDR Performed at [facility], [address] [place], [place] [address]   SARS Coronavirus 2 by RT PCR (hospital order, performed in [place] hospital lab) Nasopharyngeal Nasopharyngeal Swab     Status: None   Collection Time: 09/21/19  3:28 PM   Specimen: Nasopharyngeal Swab  Result Value Ref Range Status   SARS Coronavirus 2 NEGATIVE NEGATIVE Final    Comment: (NOTE) SARS-CoV-2 target nucleic acids are NOT DETECTED.  The SARS-CoV-2 RNA is generally detectable in upper and lower respiratory specimens during the acute phase of infection. The lowest concentration of SARS-CoV-2 viral copies this assay can detect is 250 copies / mL. A negative result does not preclude SARS-CoV-2 infection and should not be used as the sole basis for treatment or other patient management decisions.  A negative result may occur with improper specimen collection / handling, submission of specimen other than nasopharyngeal swab, presence of viral mutation(s) within the areas targeted by this assay, and inadequate number of viral copies (<250 copies / mL). A negative result must be combined with clinical observations, patient history, and epidemiological information.  Fact Sheet for Patients:   StrictlyIdeas.no  Fact Sheet for Healthcare Providers: BankingDealers.co.za  This test is not yet approved or  cleared by the [place] and has been authorized for detection and/or diagnosis of SARS-CoV-2 by FDA under an Emergency Use  Authorization (EUA).  This EUA will remain in effect (meaning this test can be used) for the duration of the COVID-19 declaration under Section 564(b)(1) of the Act, 21 U.S.C. section 360bbb-3(b)(1), unless the authorization is terminated or revoked sooner.  Performed at [facility], [address] [place], [place] [address]   MRSA PCR Screening     Status: None   Collection Time: 09/21/19 10:16 PM   Specimen: Nasal Mucosa; Nasopharyngeal  Result Value Ref Range Status   MRSA by PCR NEGATIVE NEGATIVE Final    Comment:        The GeneXpert MRSA Assay (FDA approved for NASAL specimens only), is one component of a comprehensive MRSA colonization surveillance program. It is not intended to diagnose MRSA infection nor to guide or monitor treatment for MRSA infections. Performed at [facility], [address], [place], [place] [address]      Radiology Studies: ECHOCARDIOGRAM LIMITED  Result Date: 09/26/2019    ECHOCARDIOGRAM LIMITED REPORT   Patient Name:   SEBERT STOLLINGS Date of Exam: 09/26/2019 Medical Rec #:  [id number]             Height:       72.0 in Accession #:    [id number]            Weight:       217.1 lb Date of Birth:  1955/07/14             BSA:          2.206 m Patient Age:    18 years              BP:           113/63 mmHg Patient Gender: M                     HR:           82 bpm. Exam Location:  [facility] Procedure: Limited Echo, Cardiac Doppler and Color Doppler Indications:     Atrial Flutter 427.32  History:         Patient has prior history of Echocardiogram examinations, most                  recent 09/14/2019. COPD, Signs/Symptoms:Chest Pain; Risk                  Factors:Hypertension and Diabetes.  Sonographer:     Sherrie Sport RDCS (AE) Referring Phys:  [place] Diagnosing Phys: Ida Rogue MD  Sonographer Comments: No apical window. Image acquisition challenging due to COPD. IMPRESSIONS  1. Left ventricular ejection  fraction, by estimation, is 55 to 60%. The left ventricle has normal function. The left ventricle has no regional wall motion abnormalities. Indeterminate diastolic filling due to E-A fusion.  2. Right ventricular systolic function is normal. The right ventricular size is normal. There is normal pulmonary artery systolic pressure.  3. The mitral valve was not well visualized. No evidence of mitral valve regurgitation. No evidence of mitral stenosis.  4. The aortic valve was not well visualized. Aortic valve regurgitation is not visualized. Mild aortic valve sclerosis is present, with no evidence of aortic valve stenosis.  5. Normal sinus rhythm. FINDINGS  Left Ventricle: Left ventricular ejection fraction, by estimation, is 55 to 60%. The left ventricle has normal  function. The left ventricle has no regional wall motion abnormalities. The left ventricular internal cavity size was normal in size. There is  no left ventricular hypertrophy. Indeterminate diastolic filling due to E-A fusion. Right Ventricle: The right ventricular size is normal. No increase in right ventricular wall thickness. Right ventricular systolic function is normal. There is normal pulmonary artery systolic pressure. The tricuspid regurgitant velocity is 2.00 m/s, and  with an assumed right atrial pressure of 5 mmHg, the estimated right ventricular systolic pressure is 82.5 mmHg. Left Atrium: Left atrial size was normal in size. Right Atrium: Right atrial size was normal in size. Pericardium: There is no evidence of pericardial effusion. Mitral Valve: The mitral valve was not well visualized. No evidence of mitral valve stenosis. Tricuspid Valve: The tricuspid valve is not well visualized. Tricuspid valve regurgitation is trivial. No evidence of tricuspid stenosis. Aortic Valve: The aortic valve was not well visualized. Aortic valve regurgitation is not visualized. Mild aortic valve sclerosis is present, with no evidence of aortic valve stenosis.  Pulmonic Valve: The pulmonic valve was not well visualized. Pulmonic valve regurgitation is not visualized. Aorta: The aortic root was not well visualized, the ascending aorta was not well visualized and the aortic arch was not well visualized. Venous: The pulmonary veins were not well visualized. LEFT VENTRICLE PLAX 2D LVIDd:         4.13 cm LVIDs:         2.63 cm LV PW:         1.42 cm LV IVS:        1.11 cm LVOT diam:     2.00 cm LVOT Area:     3.14 cm  LEFT ATRIUM         Index LA diam:    4.00 cm 1.81 cm/m   AORTA Ao Root diam: 3.50 cm TRICUSPID VALVE TR Peak grad:   16.0 mmHg TR Vmax:        200.00 cm/s  SHUNTS Systemic Diam: 2.00 cm Ida Rogue MD Electronically signed by Ida Rogue MD Signature Date/Time: 09/26/2019/1:34:59 PM    Final (Updated)    DG Chest Port 1 View  Final Result    US RENAL  Final Result    DG Chest [facility] 1 View  Final Result    DG Chest Portable 1 View  Final Result    DG Chest Port 1 View  Final Result      Scheduled Meds: . amiodarone  400 mg Oral BID  . atorvastatin  40 mg Oral Daily  . bisoprolol  5 mg Oral BID  . chlorhexidine gluconate (MEDLINE KIT)  15 mL Mouth Rinse BID  . feeding supplement (NEPRO CARB STEADY)  237 mL Oral BID BM  . furosemide  20 mg Oral Daily  . insulin aspart  0-5 Units Subcutaneous QHS  . insulin aspart  0-9 Units Subcutaneous TID WC  . ipratropium-albuterol  3 mL Nebulization TID  . midodrine  10 mg Oral Q8H  . nicotine  21 mg Transdermal Daily  . pantoprazole  40 mg Oral QHS  . pregabalin  200 mg Oral TID  . rivaroxaban  20 mg Oral Q supper   PRN Meds: albuterol, ALPRAZolam, docusate, melatonin, ondansetron (ZOFRAN) IV, oxyCODONE, polyethylene glycol, polyvinyl alcohol, sodium chloride flush Continuous Infusions: . sodium chloride 0 mL (09/21/19 1624)      LOS: 6 days  Time spent: Greater than 50% of the 35 minute visit was spent in counseling/coordination of care for the  patient as laid out in the A&P.    Dwyane Dee, MD [facility] 09/27/2019, 2:42 PM  Contact via secure chat.  To contact the attending provider between 7A-7P or the covering provider during after hours 7P-7A, please log into the web site [email or web address] and access using universal [place] password for that web site. If you do not have the password, please call the hospital operator.

## 2019-09-27 NOTE — Progress Notes (Signed)
Progress Note  Patient Name: Gregory Hall Date of Encounter: 09/27/2019  Primary Cardiologist: Kathlyn Sacramento, MD   Subjective   No CP, SOB, racing HR, dizziness. Breathing improved. States he feels much improved today than he has the rest of admission.   At baseline , uses 3L [place] oxygen most of the time at home, which he is currently on in the room.   He normally uses a wheelchair and thus has not ambulated around the unit.  He reports some nausea 2/2 "irritation," as he states he has not yet gotten his AM medications.  R arm swelling noted today, which she states is actually significantly improved from the right arm edema overnight.  He states that he had multiple IVs in this arm.  Cold compress was recommended.  We reviewed the plan for oral amiodarone and follow-up in the office.  Inpatient Medications    Scheduled Meds: . atorvastatin  40 mg Oral Daily  . bisoprolol  5 mg Oral BID  . chlorhexidine gluconate (MEDLINE KIT)  15 mL Mouth Rinse BID  . feeding supplement (NEPRO CARB STEADY)  237 mL Oral BID BM  . furosemide  20 mg Oral Daily  . insulin aspart  0-5 Units Subcutaneous QHS  . insulin aspart  0-9 Units Subcutaneous TID WC  . ipratropium-albuterol  3 mL Nebulization Q6H  . magnesium oxide  800 mg Oral Once  . midodrine  10 mg Oral Q8H  . nicotine  21 mg Transdermal Daily  . pantoprazole  40 mg Oral QHS  . pregabalin  200 mg Oral TID  . rivaroxaban  20 mg Oral Q supper   Continuous Infusions: . sodium chloride 0 mL (09/21/19 1624)  . amiodarone 30 mg/hr (09/27/19 0740)   PRN Meds: albuterol, ALPRAZolam, docusate, melatonin, morphine injection, ondansetron (ZOFRAN) IV, polyethylene glycol, polyvinyl alcohol, sodium chloride flush   Vital Signs    Vitals:   09/26/19 2314 09/27/19 0247 09/27/19 0354 09/27/19 0748  BP: 134/75  113/63   Pulse: 67   71  Resp: _0 Temp: 98 F (36.7 C)  98.9 F (37.2 C)   TempSrc: Oral  Oral   SpO2: 95% 96%  92% 91%  Weight:   95.1 kg   Height:        Intake/Output Summary (Last 24 hours) at 09/27/2019 0855 Last data filed at 09/27/2019 0403 Gross per 24 hour  Intake 1099.95 ml  Output 4300 ml  Net -3200.05 ml   Last 3 Weights 09/27/2019 09/26/2019 09/25/2019  Weight (lbs) 209 lb 9.6 oz 217 lb 1.6 oz 215 lb 3.2 oz  Weight (kg) 95.074 kg 98.476 kg 97.614 kg      Telemetry    NSR, 70s- Personally Reviewed  ECG    No new tracings- Personally Reviewed  Physical Exam   GEN: No acute distress.  Lying in bed and watching television.  Neck:JVP ~10-11cm  Cardiac: RRR, no murmurs, rubs, or gallops.  Respiratory:  Bilateral expiratory wheeze, best appreciated on the right. GI: Soft, nontender, non-distended  MS:  1-2+ bilateral lower extremity edema, worse on the right; No deformity.  Upper extremity skin discoloration and right upper extremity edema. Neuro:  Nonfocal  Psych: Normal affect  Labs    High Sensitivity Troponin:   Recent Labs  Lab 09/12/19 2043 09/21/19 1526 09/21/19 1920  TROPONINIHS 78* 29* 21*      Chemistry Recent Labs  Lab 09/21/19 1526 09/22/19 0515 09/23/19 1701 09/24/19 0421 09/25/19 0545  09/26/19 0649 09/27/19 0601  NA 129*   < > 144   < > 141 141 140  K 7.0*   < > 4.4   < > 4.0 4.0 4.0  CL 82*   < > 102   < > 103 100 98  CO2 34*   < > 30   < > 30 31 32  GLUCOSE 142*   < > 141*   < > 121* 130* 166*  BUN 127*   < > 26*   < > _0 CREATININE 7.07*   < > 1.12   < > 0.92 0.93 0.91  CALCIUM 8.0*   < > 8.1*   < > 8.2* 8.5* 8.6*  PROT 6.1*  --  5.2*  --   --   --   --   ALBUMIN 3.1*  --  2.6*  --   --   --   --   AST 24  --  26  --   --   --   --   ALT 18  --  22  --   --   --   --   ALKPHOS 79  --  64  --   --   --   --   BILITOT 0.9  --  0.6  --   --   --   --   GFRNONAA 7*   < > >60   < > >60 >60 >60  GFRAA 9*   < > >60   < > >60 >60 >60  ANIONGAP 13   < > 12   < > _1 < > = values in this interval not displayed.      Hematology Recent Labs  Lab 09/22/19 0515 09/26/19 0649 09/27/19 0601  WBC 10.0 8.5 9.1  RBC 3.99* 3.56* 3.59*  HGB 11.1* 10.0* 10.1*  HCT 34.8* 32.0* 31.2*  MCV 87.2 89.9 86.9  MCH 27.8 28.1 28.1  MCHC 31.9 31.3 32.4  RDW 18.6* 19.0* 18.8*  PLT 353 196 207    BNP Recent Labs  Lab 09/21/19 1527  BNP 247.5*     DDimer No results for input(s): DDIMER in the last 168 hours.   Radiology    DG Chest Port 1 View  Result Date: 09/25/2019 CLINICAL DATA:  Shortness of breath EXAM: PORTABLE CHEST 1 VIEW COMPARISON:  September 22, 2019 FINDINGS: Endotracheal tube and nasogastric tube no longer evident. Thoracic stimulator leads in midthoracic region. There is mild bibasilar atelectasis. Lungs elsewhere are clear. The heart size and pulmonary vascularity are normal. No adenopathy. There is aortic atherosclerosis. There is a stent in the left subclavian artery. There is postoperative change in the lower cervical region. IMPRESSION: No pneumothorax. Mild bibasilar atelectasis. No edema or airspace opacity. Stable cardiac silhouette. Aortic Atherosclerosis (ICD10-I70.0). Electronically Signed   By: Lowella Grip III M.D.   On: 09/25/2019 09:41   ECHOCARDIOGRAM LIMITED  Result Date: 09/26/2019    ECHOCARDIOGRAM LIMITED REPORT   Patient Name:   Gregory Hall Date of Exam: 09/26/2019 Medical Rec #:  [id number]             Height:       72.0 in Accession #:    [id number]            Weight:       217.1 lb Date of Birth:  [birth year]             BSA:  2.206 m Patient Age:    64 years              BP:           113/63 mmHg Patient Gender: M                     HR:           82 bpm. Exam Location:  [facility] Procedure: Limited Echo, Cardiac Doppler and Color Doppler Indications:     Atrial Flutter 427.32  History:         Patient has prior history of Echocardiogram examinations, most                  recent 09/14/2019. COPD, Signs/Symptoms:Chest Pain; Risk                   Factors:Hypertension and Diabetes.  Sonographer:     Sherrie Sport RDCS (AE) Referring Phys:  [place] Diagnosing Phys: Ida Rogue MD  Sonographer Comments: No apical window. Image acquisition challenging due to COPD. IMPRESSIONS  1. Left ventricular ejection fraction, by estimation, is 55 to 60%. The left ventricle has normal function. The left ventricle has no regional wall motion abnormalities. Indeterminate diastolic filling due to E-A fusion.  2. Right ventricular systolic function is normal. The right ventricular size is normal. There is normal pulmonary artery systolic pressure.  3. The mitral valve was not well visualized. No evidence of mitral valve regurgitation. No evidence of mitral stenosis.  4. The aortic valve was not well visualized. Aortic valve regurgitation is not visualized. Mild aortic valve sclerosis is present, with no evidence of aortic valve stenosis.  5. Normal sinus rhythm. FINDINGS  Left Ventricle: Left ventricular ejection fraction, by estimation, is 55 to 60%. The left ventricle has normal function. The left ventricle has no regional wall motion abnormalities. The left ventricular internal cavity size was normal in size. There is  no left ventricular hypertrophy. Indeterminate diastolic filling due to E-A fusion. Right Ventricle: The right ventricular size is normal. No increase in right ventricular wall thickness. Right ventricular systolic function is normal. There is normal pulmonary artery systolic pressure. The tricuspid regurgitant velocity is 2.00 m/s, and  with an assumed right atrial pressure of 5 mmHg, the estimated right ventricular systolic pressure is 23.3 mmHg. Left Atrium: Left atrial size was normal in size. Right Atrium: Right atrial size was normal in size. Pericardium: There is no evidence of pericardial effusion. Mitral Valve: The mitral valve was not well visualized. No evidence of mitral valve stenosis. Tricuspid Valve: The tricuspid valve is  not well visualized. Tricuspid valve regurgitation is trivial. No evidence of tricuspid stenosis. Aortic Valve: The aortic valve was not well visualized. Aortic valve regurgitation is not visualized. Mild aortic valve sclerosis is present, with no evidence of aortic valve stenosis. Pulmonic Valve: The pulmonic valve was not well visualized. Pulmonic valve regurgitation is not visualized. Aorta: The aortic root was not well visualized, the ascending aorta was not well visualized and the aortic arch was not well visualized. Venous: The pulmonary veins were not well visualized. LEFT VENTRICLE PLAX 2D LVIDd:         4.13 cm LVIDs:         2.63 cm LV PW:         1.42 cm LV IVS:        1.11 cm LVOT diam:     2.00 cm LVOT Area:  3.14 cm  LEFT ATRIUM         Index LA diam:    4.00 cm 1.81 cm/m   AORTA Ao Root diam: 3.50 cm TRICUSPID VALVE TR Peak grad:   16.0 mmHg TR Vmax:        200.00 cm/s  SHUNTS Systemic Diam: 2.00 cm Ida Rogue MD Electronically signed by Ida Rogue MD Signature Date/Time: 09/26/2019/1:34:59 PM    Final (Updated)     Cardiac Studies   2D Echocardiogram 9.5.2021  1. Left ventricular ejection fraction, by estimation, is 65 to 70%. The  left ventricle has normal function. The left ventricle has no regional  wall motion abnormalities. Left ventricular diastolic parameters are  consistent with Grade II diastolic  dysfunction (pseudonormalization).  2. Right ventricular systolic function is normal. The right ventricular  size is normal. There is normal pulmonary artery systolic pressure.  3. The mitral valve is normal in structure. No evidence of mitral valve  regurgitation.  4. The aortic valve was not well visualized. Aortic valve regurgitation  not assessed.  5. Pulmonic valve regurgitation not assessed.  6. The inferior vena cava is normal in size with greater than 50%  respiratory variability, suggesting right atrial pressure of 3 mmHg.   Patient Profile     64  y.o. male with history of paroxysmal atrial fibrillation/flutter, CAD s/p prior LAD PCI in 2010, hypertension, HFpEF, hyperlipidemia, COPD, and diabetes, and who was recently admitted 9/3 with respiratory failure/COPD and mildly elevated troponin (normal EF by echo) and was readmitted 9/12 after being found unresponsive with respiratory failure requiring intubation, hypotension, AKI with creatinine 7.07, hyperkalemia (7.0) treated with dialysis, and who later developed atrial flutter with RVR on 9/13.  Assessment & Plan    Atrial flutter with RVR --Maintaining NSR with rates controlled this AM s/p DCCV 9/17.  Developed atrial flutter 9/13, treated with amiodarone then transitioned to oral diltiazem and BB.  Recurrent atrial flutter 9/16 without response to IV digoxin x2 and amiodarone infusion; therefore, underwent DCCV 9/17. --Transitioned this AM from IV amiodarone to po amiodarone 469m BID. Continue for 1 week to load with close office follow-up.  --Continue bisoprolol for rate control. Current rates controlled.  --Continue [place] Daily CBC. He does have anemia of chronic dz with Hgb stable at ~10.0 and HCT 31.2. Continue to monitor closely.   AOC hypoxic respiratory failure / COPD --Readmission 9/12 due to unresponsiveness and hypoxia with AKI and hyperkalemia, treated with IV fluids 2/2 concern for dehydration.  He was subsequently intubated and underwent HD for respiratory distress. Consider that cardiac arrhythmia may have contributed to his symptoms and volume satus.  He is now NSR s/p DCCV and amiodarone infusion, transition to oral amiodarone 400 mg BID today to maintain NSR. Transitioned to PTA lasix as well. Bilateral wheezing appreciated today. Consider breathing treatment given his wheezing.   Acute on chronic diastolic CHF --HFpEF exacerbation likely 2/2 Aflutter with RVR. Transitioned to PTA lasix 272mdaily and continues to put out a significant amount of urine at 4.3L urine  output yesterday with wt 95.5kg  95.1kg. Echo with nl EF and low RVSP. Earlier AKI with Cr improved s/p HD and diuresis. Current Cr stable at 0.91 with BUN 10. Electrolytes improved with K 4.0. If BP allows, consider an additional dose of IV lasix this AM given his current volume status. For now, and given his hypotension, continue oral lasix / gentle diuresis.   CAD with stable angina --No CP. S/p  PCI to LAD 2010.  Minimally elevated HS Tn.  Suspect 2/2 supply demand ischemia in the setting of tachyarrhythmia and respiratory distress.  No further work-up at this time.  AKI/ARF --S/p HD with catheter insertion of R femoral vein per review of procedure notes.  Restarted on PTA Lasix 20 mg daily.  Anemia --Likely of chronic dz. Monitor closely on [place].  Tobacco use --Smoking cessation advised.   For questions or updates, please contact [place] Please consult [email or web address] for contact info under        Signed, Arvil Chaco, PA-C  09/27/2019, 8:55 AM

## 2019-09-27 NOTE — Progress Notes (Addendum)
Pt is complaining of dryness in eyes and requesting for an eyedrops. Notify NP Jon Billings. Will continue to monitor.  Update 0510: NP Jon Billings ordered Liquifilm tears 1.4 % ophthalmic solution 2 drop both eyes as needed. Will continue to monitor.   Update 0657: CCMD called and reported that pt was off the monitor the whole night. Staff nurse talked to CCMD Riki Rusk last night 09/26/19 and asked him to do every 4 hour Q strip. As per CCMD no record were seen last night but Q strip was done from 1900 to 0200. Will notify incoming shift.

## 2019-09-28 LAB — CBC WITH DIFFERENTIAL/PLATELET
Abs Immature Granulocytes: 0.04 10*3/uL (ref 0.00–0.07)
Basophils Absolute: 0.1 10*3/uL (ref 0.0–0.1)
Basophils Relative: 1 %
Eosinophils Absolute: 0.4 10*3/uL (ref 0.0–0.5)
Eosinophils Relative: 5 %
HCT: 34.1 % — ABNORMAL LOW (ref 39.0–52.0)
Hemoglobin: 10.5 g/dL — ABNORMAL LOW (ref 13.0–17.0)
Immature Granulocytes: 1 %
Lymphocytes Relative: 18 %
Lymphs Abs: 1.5 10*3/uL (ref 0.7–4.0)
MCH: 27.6 pg (ref 26.0–34.0)
MCHC: 30.8 g/dL (ref 30.0–36.0)
MCV: 89.5 fL (ref 80.0–100.0)
Monocytes Absolute: 0.9 10*3/uL (ref 0.1–1.0)
Monocytes Relative: 11 %
Neutro Abs: 5.2 10*3/uL (ref 1.7–7.7)
Neutrophils Relative %: 64 %
Platelets: 242 10*3/uL (ref 150–400)
RBC: 3.81 MIL/uL — ABNORMAL LOW (ref 4.22–5.81)
RDW: 18.6 % — ABNORMAL HIGH (ref 11.5–15.5)
WBC: 8 10*3/uL (ref 4.0–10.5)
nRBC: 0 % (ref 0.0–0.2)

## 2019-09-28 LAB — BASIC METABOLIC PANEL
Anion gap: 7 (ref 5–15)
BUN: 13 mg/dL (ref 8–23)
CO2: 36 mmol/L — ABNORMAL HIGH (ref 22–32)
Calcium: 9 mg/dL (ref 8.9–10.3)
Chloride: 97 mmol/L — ABNORMAL LOW (ref 98–111)
Creatinine, Ser: 1.03 mg/dL (ref 0.61–1.24)
GFR calc Af Amer: >60 mL/min (ref >60)
GFR calc non Af Amer: >60 mL/min (ref >60)
Glucose, Bld: 141 mg/dL — ABNORMAL HIGH (ref 70–99)
Potassium: 4.8 mmol/L (ref 3.5–5.1)
Sodium: 140 mmol/L (ref 135–145)

## 2019-09-28 LAB — GLUCOSE, CAPILLARY
Glucose-Capillary: 191 mg/dL — ABNORMAL HIGH (ref 70–99)
Glucose-Capillary: 181 mg/dL — ABNORMAL HIGH (ref 70–99)

## 2019-09-28 LAB — MAGNESIUM: Magnesium: 1.8 mg/dL (ref 1.7–2.4)

## 2019-09-28 MED ORDER — AMIODARONE HCL 200 MG PO TABS: 200.0000 mg | ORAL_TABLET | Freq: Two times a day (BID) | ORAL | 5 refills | Status: DC

## 2019-09-28 MED ORDER — MIDODRINE HCL 10 MG PO TABS
10.0000 mg | ORAL_TABLET | Freq: Three times a day (TID) | ORAL | 3 refills | Status: DC
Start: 2019-09-28 — End: 2019-12-15

## 2019-09-28 MED ORDER — AMIODARONE HCL 400 MG PO TABS: 400.0000 mg | ORAL_TABLET | Freq: Two times a day (BID) | ORAL | 0 refills | Status: DC

## 2019-09-28 MED ORDER — BISOPROLOL FUMARATE 5 MG PO TABS: 2.5000 mg | ORAL_TABLET | Freq: Every day | ORAL | Status: DC

## 2019-09-28 MED ORDER — ATORVASTATIN CALCIUM 40 MG PO TABS: 40.0000 mg | ORAL_TABLET | Freq: Every day | ORAL | 5 refills | Status: AC

## 2019-09-28 MED ORDER — BISOPROLOL FUMARATE 5 MG PO TABS
2.5000 mg | ORAL_TABLET | Freq: Every day | ORAL | 5 refills | Status: DC
Start: 2019-09-29 — End: 2019-10-17

## 2019-09-28 MED ORDER — IPRATROPIUM-ALBUTEROL 0.5-2.5 (3) MG/3ML IN SOLN: 3.0000 mL | RESPIRATORY_TRACT | Status: DC | PRN

## 2019-09-28 NOTE — Discharge Instructions (Signed)

## 2019-09-28 NOTE — Plan of Care (Signed)
  Problem: Clinical Measurements: Goal: Ability to maintain clinical measurements within normal limits will improve Outcome: Progressing Goal: Respiratory complications will improve Outcome: Progressing Goal: Cardiovascular complication will be avoided Outcome: Progressing   Problem: Pain Managment: Goal: General experience of comfort will improve Outcome: Progressing   Problem: Safety: Goal: Ability to remain free from injury will improve Outcome: Progressing   

## 2019-09-28 NOTE — Discharge Summary (Signed)
Physician Discharge Summary  Gregory Hall ERF:[id number] DOB: April 15, 1955 DOA: 09/21/2019  PCP: Armando Gang, FNP  Admit date: 09/21/2019 Discharge date: 09/28/2019  Admitted From: Group home Disposition: Group home Discharging physician: Lewie Chamber, MD  Recommendations for Outpatient Follow-up:  1. Follow-up with cardiology 2. If blood pressure becomes elevated, decrease or discontinue midodrine 3. Discharged on new medication amiodarone, see below for titration.  Continue bisoprolol and monitor for hypotension or bradycardia 4. Lasix was resumed prior to discharge and tolerated well.  Follow-up fluid status   Patient discharged to group home in Discharge Condition: stable CODE STATUS: DNR Diet recommendation:  Diet Orders (From admission, onward)    Start     Ordered   09/28/19 0000  Diet - low sodium heart healthy        09/28/19 1104   09/28/19 0000  Diet Carb Modified        09/28/19 1104   09/26/19 0919  Diet Heart Room service appropriate? Yes; Fluid consistency: Thin  Diet effective now       Question Answer Comment  Room service appropriate? Yes   Fluid consistency: Thin      09/26/19 0918          Hospital Course: Gregory Hall is a 64 yo CM with PMH COPD, HTN, GERD, HLD, depression/anxiety, CAD, DMII who presented to the hospital initially on 09/22/19 after being found unresponsive at a group home.  He is chronically on 3 L oxygen and was found to be hypoxic and hypotensive.  He resides at a group home.  He was intubated upon arrival to the hospital and initially required vasopressor support. Etiology of his respiratory failure was considered due to severe COPD exacerbation.  He was also evaluated by cardiology for new onset a flutter with RVR and started on a Cardizem infusion. He also developed renal failure with etiology considered due to his hypotension and possibly even his hypoxia on admission.  He required temporary dialysis and renal function  slowly improved.  He was transferred out of the ICU on 09/24/2019.  He continued to have ongoing uncontrolled ventricular rates up to 130.  Cardiology continue to follow.  His blood pressure remained soft/hypotensive precluding further up titration of Cardizem.  He had also briefly been on amiodarone while in the ER.  Due to ongoing tachycardia refractory to treatment with ensuing hypotension, he underwent cardioversion with cardiology on 09/26/2019.  He was converted to NSR.  He is continued on amiodarone with addition of bisoprolol for rate control. He remained stable and in normal sinus rhythm after cardioversion.  He was continued on oral amiodarone at discharge and will complete total of 1 week of amiodarone 400 mg twice daily then decrease down to 200 mg twice daily thereafter.  He was started on bisoprolol and at discharge continued on bisoprolol 2.5 mg daily.  He was already on Xarelto which was also continued. He will follow-up with cardiology outpatient. He remained overall improved and considered stable for discharging back to his group home which he was amenable with.  Of note, he had met with Palliative care after transfer out of the ICU. After discussion, he decided to change his code status to DNR and the form was filled out and signed prior to discharge.    Acute metabolic encephalopathy - now resolved; considered multifactorial notably due to respiratory failure and renal failure   Acute on chronic respiratory failure with hypoxia (HCC) - on 3L [place] O2 at group home - s/p  intubation and now has been extubated and tx'd out of ICU - continue O2 - continue breathing treatments  Atrial flutter ([place]) - with rapid rate -Has been trialed on amiodarone and Cardizem.  Had been hypotensive and unable to continue with Cardizem.  Amiodarone reinitiated per cardiology on 09/25/2019.  Digoxin also given on 09/25/2019 per cardiology. -Due to ongoing rapid rate, patient was successfully cardioverted  by cardiology on 09/26/2019.  He is now in NSR.  - amio gtt changed to PO 438m BID x 1 week then 2051mBID after -Continue bisoprolol per cardiology - midodrine initiated for hypotension; continue and monitor response.  If blood pressure becomes elevated outpatient, may need to discontinue his midodrine. - greatly appreciate cardiology assistance in the management of this complex patient   Goals of care, counseling/discussion -Overall he has improved from initial presentation.  Palliative care has been consulted and is following, appreciate assistance -CODE STATUS is DNR at this time; he has told palliative care he does not want to be back on a ventilator  Hyperkalemia -Now resolved.  Has responded to treatment  Acute diastolic CHF (congestive heart failure) ([place]-Repeat echo pending to reevaluate in setting of a flutter -Cardiology following, appreciate assistance -Previous echo from 09/14/2019 revealed EF 65 to 70% with grade 2 diastolic dysfunction -On exam, he is edematous and appears volume overloaded - continue lasix, restarted per cardiology  CAD (coronary artery disease) -Prior LAD stent in 2010 -Elevated troponin on admission considered in setting of demand ischemia especially with significant tachycardia -No further work-up indicated per cardiology   The patient's chronic medical conditions were treated accordingly per the patient's home medication regimen except as noted.  On day of discharge, patient was felt deemed stable for discharge. Patient/family member advised to call PCP or come back to ER if needed.   Discharge Diagnoses:   Principal Diagnosis: Acute on chronic respiratory failure with hypoxia ([facility] Active Hospital Problems   Diagnosis Date Noted  . Acute on chronic respiratory failure with hypoxia ([facility]/04/2019    Priority: Medium  . Atrial flutter ([facility]    Priority: High  . Acute diastolic CHF (congestive heart failure) ([facility]09/03/2019  . CAD  (coronary artery disease) 06/09/2019  . Goals of care, counseling/discussion 10/06/2010    Resolved Hospital Problems   Diagnosis Date Noted Date Resolved  . Acute metabolic encephalopathy  0954/[id number]/8676  Priority: Low  . Hyperkalemia  09/25/2019    Discharge Instructions    Diet - low sodium heart healthy   Complete by: As directed    Diet Carb Modified   Complete by: As directed    Discharge instructions   Complete by: As directed    If blood pressure starts to elevate then stop taking midodrine. Follow up with cardiology at discharge.   Increase activity slowly   Complete by: As directed      Allergies as of 09/28/2019      Reactions   Acetaminophen Other (See Comments)   Reaction:  Unknown  Kidney failure Told not to take from home M.D. Related to kidney and renal failure   Nsaids Other (See Comments)   Reaction:  Unknown  Kidney failure Patient states not to take from home M.D. Related to kidney and renal failure. Other reaction(s): Unknown      Medication List    STOP taking these medications   acetylcysteine 20 % nebulizer solution Commonly known as: MUCOMYST   lisinopril 20 MG tablet Commonly known as: ZESTRIL  metoprolol tartrate 25 MG tablet Commonly known as: LOPRESSOR     TAKE these medications   amiodarone 400 MG tablet Commonly known as: PACERONE Take 1 tablet (400 mg total) by mouth 2 (two) times daily for 5 days.   amiodarone 200 MG tablet Commonly known as: PACERONE Take 1 tablet (200 mg total) by mouth 2 (two) times daily. Start taking on: October 04, 2019   aspirin EC 81 MG tablet Take 81 mg by mouth daily.   atorvastatin 40 MG tablet Commonly known as: LIPITOR Take 1 tablet (40 mg total) by mouth daily. What changed:   medication strength  how much to take  when to take this   baclofen 20 MG tablet Commonly known as: LIORESAL Take 20 mg by mouth every 8 (eight) hours.   bisoprolol 5 MG tablet Commonly known as:  ZEBETA Take 0.5 tablets (2.5 mg total) by mouth daily. Start taking on: September 29, 2019   cyclobenzaprine 5 MG tablet Commonly known as: FLEXERIL Take 1 tablet (5 mg total) by mouth 3 (three) times daily as needed. What changed:   when to take this  reasons to take this   DULoxetine 60 MG capsule Commonly known as: CYMBALTA Take 60 mg by mouth daily.   Ensure Max Protein Liqd Take 330 mLs (11 oz total) by mouth 2 (two) times daily between meals.   furosemide 20 MG tablet Commonly known as: LASIX Take 1 tablet (20 mg total) by mouth daily. What changed:   when to take this  reasons to take this   guaiFENesin 600 MG 12 hr tablet Commonly known as: MUCINEX Take 600 mg by mouth 2 (two) times daily.   insulin glargine 100 UNIT/ML injection Commonly known as: LANTUS Inject 22 Units into the skin at bedtime.   insulin lispro 100 UNIT/ML injection Commonly known as: HUMALOG Inject 4 Units into the skin 3 (three) times daily before meals. Hold for FSBG <100   ipratropium-albuterol 0.5-2.5 (3) MG/3ML Soln Commonly known as: DUONEB Take 3 mLs by nebulization every 6 (six) hours as needed. What changed: reasons to take this   lactulose 10 GM/15ML solution Commonly known as: CHRONULAC Take 20 g by mouth daily as needed for mild constipation.   lamoTRIgine 100 MG tablet Commonly known as: LAMICTAL Take 100 mg by mouth daily.   metFORMIN 750 MG 24 hr tablet Commonly known as: GLUCOPHAGE-XR Take 1 tablet (750 mg total) by mouth 2 (two) times daily.   midodrine 10 MG tablet Commonly known as: PROAMATINE Take 1 tablet (10 mg total) by mouth every 8 (eight) hours.   NAC 600 MG Caps Generic drug: Acetylcysteine Take 600 mg by mouth daily.   Nitrostat 0.4 MG SL tablet Generic drug: nitroGLYCERIN DISSOLVE (1) TABLET UNDER TONGUE AS NEEDED TO RELIEVE CHEST PAIN. MAYREPEAT EVERY 5 MINUTES. What changed: See the new instructions.   ondansetron 4 MG tablet Commonly  known as: ZOFRAN Take 4 mg by mouth every 8 (eight) hours as needed for nausea or vomiting.   Oxycodone HCl 10 MG Tabs Take 1 tablet (10 mg total) by mouth 3 (three) times daily.   oxymetazoline 0.05 % nasal spray Commonly known as: AFRIN Place 2 sprays into both nostrils 2 (two) times daily as needed for congestion.   pantoprazole 40 MG tablet Commonly known as: PROTONIX Take 40 mg by mouth daily.   pregabalin 200 MG capsule Commonly known as: LYRICA Take 1 capsule (200 mg total) by mouth 3 (three) times daily.  rivaroxaban 20 MG Tabs tablet Commonly known as: XARELTO Take 20 mg by mouth daily with supper.   SALONPAS PAIN RELIEF PATCH EX Place 1 patch onto the skin every 12 (twelve) hours as needed (aches and pains).   senna-docusate 8.6-50 MG tablet Commonly known as: Senokot-S Take 1 tablet by mouth daily.   traZODone 100 MG tablet Commonly known as: DESYREL Take 100 mg by mouth at bedtime.   Trelegy Ellipta 100-62.5-25 MCG/INH Aepb Generic drug: Fluticasone-Umeclidin-Vilant Inhale 1 puff into the lungs daily.   Vitamin D3 50 MCG (2000 UT) Tabs Take 2,000 Units by mouth daily.       Follow-up Information    [facility] Follow up on 10/03/2019.   Specialty: Cardiology Why: at 11:00am. Enter through the [facility] entrance Contact information: [place], [place], FNP Follow up.   Specialty: Family Medicine Why: Please schedule PCP appointment for within 3-5 days of discharge date. Contact information: [place] [address] [phone]        Wellington Hampshire, MD. Schedule an appointment as soon as possible for a visit in 1 week(s).   Specialty: Cardiology Contact information: [address] [place] [address] [phone]              Allergies  Allergen Reactions  . Acetaminophen  Other (See Comments)    Reaction:  Unknown  Kidney failure Told not to take from home M.D. Related to kidney and renal failure   . Nsaids Other (See Comments)    Reaction:  Unknown  Kidney failure Patient states not to take from home M.D. Related to kidney and renal failure.  Other reaction(s): Unknown    Consultations: Cardiology  Discharge Exam: BP (!) 117/50 (BP Location: Left Arm)   Pulse 62   Temp 98.8 F (37.1 C) (Oral)   Resp 17   Ht 6' (1.829 m)   Wt 95.6 kg   SpO2 93%   BMI 28.58 kg/m  General appearance: alert, cooperative and no distress Head: Normocephalic, without obvious abnormality, atraumatic Eyes: EOMI Lungs: improved coarse sounds, no wheezing appreciated Heart: irregularly irregular rhythm and S1, S2 normal Abdomen: normal findings: bowel sounds normal and soft, non-tender Extremities: 2+ bilateral lower extremity pitting edema as well as some edema appreciated in upper extremities Skin: Scattered bruising Neurologic: Grossly normal  The results of significant diagnostics from this hospitalization (including imaging, microbiology, ancillary and laboratory) are listed below for reference.   Microbiology: Recent Results (from the past 240 hour(s))  Blood Culture (routine x 2)     Status: None   Collection Time: 09/21/19  3:25 PM   Specimen: BLOOD  Result Value Ref Range Status   Specimen Description BLOOD LEFT ARM  Final   Special Requests   Final    BOTTLES DRAWN AEROBIC AND ANAEROBIC Blood Culture adequate volume   Culture   Final    NO GROWTH 5 DAYS Performed at [facility], [address], [place], [place] [address]    Report Status 09/26/2019 FINAL  Final  Blood Culture (routine x 2)     Status: Abnormal   Collection Time: 09/21/19  3:25 PM   Specimen: BLOOD  Result Value Ref Range Status   Specimen Description   Final    BLOOD RIGHT ARM Performed at [facility], [address] [place], [place]  [place]     Special Requests   Final    BOTTLES DRAWN AEROBIC AND ANAEROBIC Blood Culture adequate volume Performed at [facility], [address] [place], [place] [address]    Culture  Setup Time   Final    Organism ID to follow [place] ONLY CRITICAL RESULT CALLED TO, READ BACK BY AND VERIFIED WITH: ABBY ELLINGTON AT 1029 09/22/19 SDR GRAM STAIN REVIEWED-AGREE WITH RESULT    Culture (A)  Final    STAPHYLOCOCCUS EPIDERMIDIS THE SIGNIFICANCE OF ISOLATING THIS ORGANISM FROM A SINGLE SET OF BLOOD CULTURES WHEN MULTIPLE SETS ARE DRAWN IS UNCERTAIN. PLEASE NOTIFY THE MICROBIOLOGY DEPARTMENT WITHIN ONE WEEK IF SPECIATION AND SENSITIVITIES ARE REQUIRED. Performed at [facility], [place] [address], [place], [place] [address]    Report Status 09/24/2019 FINAL  Final  Urine culture     Status: None   Collection Time: 09/21/19  3:25 PM   Specimen: In/Out Cath Urine  Result Value Ref Range Status   Specimen Description   Final    IN/OUT CATH URINE Performed at [facility], [address], [place], [place] [address]    Special Requests   Final    NONE Performed at [facility], [address] [place], [place] [address]    Culture   Final    NO GROWTH Performed at [facility], [place] [address], [place], [place] [address]    Report Status 09/22/2019 FINAL  Final  Blood Culture ID Panel (Reflexed)     Status: Abnormal   Collection Time: 09/21/19  3:25 PM  Result Value Ref Range Status   Enterococcus faecalis NOT DETECTED NOT DETECTED Final   Enterococcus Faecium NOT DETECTED NOT DETECTED Final   Listeria monocytogenes NOT DETECTED NOT DETECTED Final   Staphylococcus species DETECTED (A) NOT DETECTED Final    Comment: CRITICAL RESULT CALLED TO, READ BACK BY AND VERIFIED WITH:  ABBY ELLINGTON AT 1029 09/22/19 SDR    Staphylococcus aureus (BCID) NOT DETECTED NOT DETECTED Final   Staphylococcus epidermidis DETECTED (A) NOT DETECTED  Final    Comment: Methicillin (oxacillin) resistant coagulase negative staphylococcus. Possible blood culture contaminant (unless isolated from more than one blood culture draw or clinical case suggests pathogenicity). No antibiotic treatment is indicated for blood  culture contaminants. CRITICAL RESULT CALLED TO, READ BACK BY AND VERIFIED WITH: ABBY ELLINGTON AT 4496 09/22/19 SDR    Staphylococcus lugdunensis NOT DETECTED NOT DETECTED Final   Streptococcus species NOT DETECTED NOT DETECTED Final   Streptococcus agalactiae NOT DETECTED NOT DETECTED Final   Streptococcus pneumoniae NOT DETECTED NOT DETECTED Final   Streptococcus pyogenes NOT DETECTED NOT DETECTED Final   A.calcoaceticus-baumannii NOT DETECTED NOT DETECTED Final   Bacteroides fragilis NOT DETECTED NOT DETECTED Final   Enterobacterales NOT DETECTED NOT DETECTED Final   Enterobacter cloacae complex NOT DETECTED NOT DETECTED Final   Escherichia coli NOT DETECTED NOT DETECTED Final   Klebsiella aerogenes NOT DETECTED NOT DETECTED Final   Klebsiella oxytoca NOT DETECTED NOT DETECTED Final   Klebsiella pneumoniae NOT DETECTED NOT DETECTED Final   Proteus species NOT DETECTED NOT DETECTED Final   Salmonella species NOT DETECTED NOT DETECTED Final   Serratia marcescens NOT DETECTED NOT DETECTED Final   Haemophilus influenzae NOT DETECTED NOT DETECTED Final   Neisseria meningitidis NOT DETECTED NOT DETECTED Final   Pseudomonas aeruginosa NOT DETECTED NOT DETECTED Final   Stenotrophomonas maltophilia NOT DETECTED NOT DETECTED Final   Candida albicans  NOT DETECTED NOT DETECTED Final   Candida auris NOT DETECTED NOT DETECTED Final   Candida glabrata NOT DETECTED NOT DETECTED Final   Candida krusei NOT DETECTED NOT DETECTED Final   Candida parapsilosis NOT DETECTED NOT DETECTED Final   Candida tropicalis NOT DETECTED NOT DETECTED Final   Cryptococcus neoformans/gattii NOT DETECTED NOT DETECTED Final   Methicillin resistance mecA/C  DETECTED (A) NOT DETECTED Final    Comment: CRITICAL RESULT CALLED TO, READ BACK BY AND VERIFIED WITH:  ABBY ELLINGTON AT 1029 09/22/19 SDR Performed at [facility], [address] [place], [place] [address]   SARS Coronavirus 2 by RT PCR (hospital order, performed in [place] hospital lab) Nasopharyngeal Nasopharyngeal Swab     Status: None   Collection Time: 09/21/19  3:28 PM   Specimen: Nasopharyngeal Swab  Result Value Ref Range Status   SARS Coronavirus 2 NEGATIVE NEGATIVE Final    Comment: (NOTE) SARS-CoV-2 target nucleic acids are NOT DETECTED.  The SARS-CoV-2 RNA is generally detectable in upper and lower respiratory specimens during the acute phase of infection. The lowest concentration of SARS-CoV-2 viral copies this assay can detect is 250 copies / mL. A negative result does not preclude SARS-CoV-2 infection and should not be used as the sole basis for treatment or other patient management decisions.  A negative result may occur with improper specimen collection / handling, submission of specimen other than nasopharyngeal swab, presence of viral mutation(s) within the areas targeted by this assay, and inadequate number of viral copies (<250 copies / mL). A negative result must be combined with clinical observations, patient history, and epidemiological information.  Fact Sheet for Patients:   StrictlyIdeas.no  Fact Sheet for Healthcare Providers: BankingDealers.co.za  This test is not yet approved or  cleared by the [place] FDA and has been authorized for detection and/or diagnosis of SARS-CoV-2 by FDA under an Emergency Use Authorization (EUA).  This EUA will remain in effect (meaning this test can be used) for the duration of the COVID-19 declaration under Section 564(b)(1) of the Act, 21 U.S.C. section 360bbb-3(b)(1), unless the authorization is terminated or revoked sooner.  Performed at [facility], [address] [place], [place] [address]   MRSA PCR Screening     Status: None   Collection Time: 09/21/19 10:16 PM   Specimen: Nasal Mucosa; Nasopharyngeal  Result Value Ref Range Status   MRSA by PCR NEGATIVE NEGATIVE Final    Comment:        The GeneXpert MRSA Assay (FDA approved for NASAL specimens only), is one component of a comprehensive MRSA colonization surveillance program. It is not intended to diagnose MRSA infection nor to guide or monitor treatment for MRSA infections. Performed at [facility], [address] [place], [place] [address]      Labs: BNP (last 3 results) Recent Labs    06/09/19 2119 09/12/19 2043 09/21/19 1527  BNP 51.5 458.1* 101.7*   Basic Metabolic Panel: Recent Labs  Lab 09/22/19 0515 09/22/19 0515 09/23/19 0330 09/23/19 0330 09/23/19 1701 09/23/19 1701 09/24/19 0421 09/25/19 0545 09/26/19 0649 09/27/19 0601 09/28/19 0447  NA 136   < > 140   < > 144   < > 142 141 141 140 140  K 4.3   < > 5.5*   < > 4.4   < > 4.1 4.0 4.0 4.0 4.8  CL 95*   < > 101   < > 102   < > 103 103 100 98  97*  CO2 31   < > 30   < > 30   < > _0 32 36*  GLUCOSE 135*   < > 228*   < > 141*   < > 148* 121* 130* 166* 141*  BUN 69*   < > 37*   < > 26*   < > _1 CREATININE 3.13*   < > 1.52*   < > 1.12   < > 1.13 0.92 0.93 0.91 1.03  CALCIUM 7.5*   < > 8.0*   < > 8.1*   < > 8.3* 8.2* 8.5* 8.6* 9.0  MG 1.9   < > 1.7   < > 1.6*  --  2.0  --  1.4* 1.6* 1.8  PHOS 3.8  --  3.3  --  2.5  --  2.4* 3.3  --   --   --    < > = values in this interval not displayed.   Liver Function Tests: Recent Labs  Lab 09/21/19 1526 09/23/19 1701  AST 24 26  ALT 18 22  ALKPHOS 79 64  BILITOT 0.9 0.6  PROT 6.1* 5.2*  ALBUMIN 3.1* 2.6*   No results for input(s): LIPASE, AMYLASE in the last 168 hours. No results for input(s): AMMONIA in the last 168 hours. CBC: Recent Labs  Lab 09/21/19 1526 09/22/19 0515 09/26/19 0649  09/27/19 0601 09/28/19 0447  WBC 11.7* 10.0 8.5 9.1 8.0  NEUTROABS 9.6*  --  5.5 6.4 5.2  HGB 11.5* 11.1* 10.0* 10.1* 10.5*  HCT 35.5* 34.8* 32.0* 31.2* 34.1*  MCV 86.0 87.2 89.9 86.9 89.5  PLT 328 353 196 207 242   Cardiac Enzymes: Recent Labs  Lab 09/21/19 1526  CKTOTAL 289   BNP: Invalid input(s): POCBNP CBG: Recent Labs  Lab 09/27/19 1220 09/27/19 1723 09/27/19 2103 09/28/19 0751 09/28/19 1125  GLUCAP 205* 168* 214* 191* 181*   D-Dimer No results for input(s): DDIMER in the last 72 hours. Hgb A1c No results for input(s): HGBA1C in the last 72 hours. Lipid Profile No results for input(s): CHOL, HDL, LDLCALC, TRIG, CHOLHDL, LDLDIRECT in the last 72 hours. Thyroid function studies No results for input(s): TSH, T4TOTAL, T3FREE, THYROIDAB in the last 72 hours.  Invalid input(s): FREET3 Anemia work up No results for input(s): VITAMINB12, FOLATE, FERRITIN, TIBC, IRON, RETICCTPCT in the last 72 hours. Urinalysis    Component Value Date/Time   COLORURINE YELLOW (A) 09/21/2019 1525   APPEARANCEUR HAZY (A) 09/21/2019 1525   APPEARANCEUR Clear 10/28/2013 2215   LABSPEC 1.016 09/21/2019 1525   LABSPEC 1.002 10/28/2013 2215   PHURINE 5.0 09/21/2019 1525   GLUCOSEU NEGATIVE 09/21/2019 1525   GLUCOSEU Negative 10/28/2013 2215   HGBUR NEGATIVE 09/21/2019 1525   BILIRUBINUR NEGATIVE 09/21/2019 1525   BILIRUBINUR Negative 10/28/2013 2215   KETONESUR NEGATIVE 09/21/2019 1525   PROTEINUR NEGATIVE 09/21/2019 1525   UROBILINOGEN 1.0 03/29/2009 0840   NITRITE NEGATIVE 09/21/2019 1525   LEUKOCYTESUR NEGATIVE 09/21/2019 1525   LEUKOCYTESUR Negative 10/28/2013 2215   Sepsis Labs Invalid input(s): PROCALCITONIN,  WBC,  LACTICIDVEN Microbiology Recent Results (from the past 240 hour(s))  Blood Culture (routine x 2)     Status: None   Collection Time: 09/21/19  3:25 PM   Specimen: BLOOD  Result Value Ref Range Status   Specimen Description BLOOD LEFT ARM  Final   Special  Requests   Final    BOTTLES DRAWN AEROBIC AND ANAEROBIC Blood Culture adequate volume  Culture   Final    NO GROWTH 5 DAYS Performed at [facility], [address] [place], [place] [address]    Report Status 09/26/2019 FINAL  Final  Blood Culture (routine x 2)     Status: Abnormal   Collection Time: 09/21/19  3:25 PM   Specimen: BLOOD  Result Value Ref Range Status   Specimen Description   Final    BLOOD RIGHT ARM Performed at [facility], [address] [place], [place] [address]    Special Requests   Final    BOTTLES DRAWN AEROBIC AND ANAEROBIC Blood Culture adequate volume Performed at [facility], [address] [place], [place] [address]    Culture  Setup Time   Final    Organism ID to follow [place] CRITICAL RESULT CALLED TO, READ BACK BY AND VERIFIED WITH: ABBY ELLINGTON AT 1029 09/22/19 SDR GRAM STAIN REVIEWED-AGREE WITH RESULT    Culture (A)  Final    STAPHYLOCOCCUS EPIDERMIDIS THE SIGNIFICANCE OF ISOLATING THIS ORGANISM FROM A SINGLE SET OF BLOOD CULTURES WHEN MULTIPLE SETS ARE DRAWN IS UNCERTAIN. PLEASE NOTIFY THE MICROBIOLOGY DEPARTMENT WITHIN ONE WEEK IF SPECIATION AND SENSITIVITIES ARE REQUIRED. Performed at [facility], [place] [address], [place], [place] [id number]    Report Status 09/24/2019 FINAL  Final  Urine culture     Status: None   Collection Time: 09/21/19  3:25 PM   Specimen: In/Out Cath Urine  Result Value Ref Range Status   Specimen Description   Final    IN/OUT CATH URINE Performed at [facility], [address], [place], [place] [address]    Special Requests   Final    NONE Performed at [facility], [address], [place], [place] [address]    Culture   Final    NO GROWTH Performed at [facility], [place] [address], [place], [place] [address]    Report Status 09/22/2019 FINAL  Final  Blood Culture ID Panel (Reflexed)     Status: Abnormal    Collection Time: 09/21/19  3:25 PM  Result Value Ref Range Status   Enterococcus faecalis NOT DETECTED NOT DETECTED Final   Enterococcus Faecium NOT DETECTED NOT DETECTED Final   Listeria monocytogenes NOT DETECTED NOT DETECTED Final   Staphylococcus species DETECTED (A) NOT DETECTED Final    Comment: CRITICAL RESULT CALLED TO, READ BACK BY AND VERIFIED WITH:  ABBY ELLINGTON AT 1029 09/22/19 SDR    Staphylococcus aureus (BCID) NOT DETECTED NOT DETECTED Final   Staphylococcus epidermidis DETECTED (A) NOT DETECTED Final    Comment: Methicillin (oxacillin) resistant coagulase negative staphylococcus. Possible blood culture contaminant (unless isolated from more than one blood culture draw or clinical case suggests pathogenicity). No antibiotic treatment is indicated for blood  culture contaminants. CRITICAL RESULT CALLED TO, READ BACK BY AND VERIFIED WITH: ABBY ELLINGTON AT 6606 09/22/19 SDR    Staphylococcus lugdunensis NOT DETECTED NOT DETECTED Final   Streptococcus species NOT DETECTED NOT DETECTED Final   Streptococcus agalactiae NOT DETECTED NOT DETECTED Final   Streptococcus pneumoniae NOT DETECTED NOT DETECTED Final   Streptococcus pyogenes NOT DETECTED NOT DETECTED Final   A.calcoaceticus-baumannii NOT DETECTED NOT DETECTED Final   Bacteroides fragilis NOT DETECTED NOT DETECTED Final   Enterobacterales NOT DETECTED NOT DETECTED Final   Enterobacter cloacae complex NOT DETECTED NOT DETECTED Final   Escherichia coli NOT DETECTED NOT DETECTED Final   Klebsiella aerogenes NOT DETECTED NOT DETECTED Final  Klebsiella oxytoca NOT DETECTED NOT DETECTED Final   Klebsiella pneumoniae NOT DETECTED NOT DETECTED Final   Proteus species NOT DETECTED NOT DETECTED Final   Salmonella species NOT DETECTED NOT DETECTED Final   Serratia marcescens NOT DETECTED NOT DETECTED Final   Haemophilus influenzae NOT DETECTED NOT DETECTED Final   Neisseria meningitidis NOT DETECTED NOT DETECTED Final    Pseudomonas aeruginosa NOT DETECTED NOT DETECTED Final   Stenotrophomonas maltophilia NOT DETECTED NOT DETECTED Final   Candida albicans NOT DETECTED NOT DETECTED Final   Candida auris NOT DETECTED NOT DETECTED Final   Candida glabrata NOT DETECTED NOT DETECTED Final   Candida krusei NOT DETECTED NOT DETECTED Final   Candida parapsilosis NOT DETECTED NOT DETECTED Final   Candida tropicalis NOT DETECTED NOT DETECTED Final   Cryptococcus neoformans/gattii NOT DETECTED NOT DETECTED Final   Methicillin resistance mecA/C DETECTED (A) NOT DETECTED Final    Comment: CRITICAL RESULT CALLED TO, READ BACK BY AND VERIFIED WITH:  ABBY ELLINGTON AT 9476 09/22/19 SDR Performed at [facility], [address] [place], [place] [address]   SARS Coronavirus 2 by RT PCR (hospital order, performed in [place] hospital lab) Nasopharyngeal Nasopharyngeal Swab     Status: None   Collection Time: 09/21/19  3:28 PM   Specimen: Nasopharyngeal Swab  Result Value Ref Range Status   SARS Coronavirus 2 NEGATIVE NEGATIVE Final    Comment: (NOTE) SARS-CoV-2 target nucleic acids are NOT DETECTED.  The SARS-CoV-2 RNA is generally detectable in upper and lower respiratory specimens during the acute phase of infection. The lowest concentration of SARS-CoV-2 viral copies this assay can detect is 250 copies / mL. A negative result does not preclude SARS-CoV-2 infection and should not be used as the sole basis for treatment or other patient management decisions.  A negative result may occur with improper specimen collection / handling, submission of specimen other than nasopharyngeal swab, presence of viral mutation(s) within the areas targeted by this assay, and inadequate number of viral copies (<250 copies / mL). A negative result must be combined with clinical observations, patient history, and epidemiological information.  Fact Sheet for Patients:   StrictlyIdeas.no  Fact  Sheet for Healthcare Providers: BankingDealers.co.za  This test is not yet approved or  cleared by the [place] FDA and has been authorized for detection and/or diagnosis of SARS-CoV-2 by FDA under an Emergency Use Authorization (EUA).  This EUA will remain in effect (meaning this test can be used) for the duration of the COVID-19 declaration under Section 564(b)(1) of the Act, 21 U.S.C. section 360bbb-3(b)(1), unless the authorization is terminated or revoked sooner.  Performed at [facility], [address] [place], [place] [address]   MRSA PCR Screening     Status: None   Collection Time: 09/21/19 10:16 PM   Specimen: Nasal Mucosa; Nasopharyngeal  Result Value Ref Range Status   MRSA by PCR NEGATIVE NEGATIVE Final    Comment:        The GeneXpert MRSA Assay (FDA approved for NASAL specimens only), is one component of a comprehensive MRSA colonization surveillance program. It is not intended to diagnose MRSA infection nor to guide or monitor treatment for MRSA infections. Performed at [facility], [address] [place], [place] [address]     Procedures/Studies: US RENAL  Result Date: 09/22/2019 CLINICAL DATA:  Acute renal failure EXAM: RENAL / URINARY TRACT ULTRASOUND COMPLETE COMPARISON:  None. FINDINGS: Right Kidney: Renal measurements: 10.9 x 5.7 x 5.0 cm =  volume: 163 mL. Echogenic renal parenchyma. No mass or hydronephrosis visualized. Left Kidney: Renal measurements: 11.0 x 5.3 x 6.0 cm = volume: 180 mL. Echogenic renal parenchyma. No mass or hydronephrosis visualized. Bladder: Decompressed by indwelling Foley catheter Other: None. IMPRESSION: Echogenic renal parenchyma, suggesting medical renal disease. No hydronephrosis. Bladder decompressed by indwelling Foley catheter. Electronically Signed   By: Julian Hy M.D.   On: 09/22/2019 09:36   DG Chest Port 1 View  Result Date: 09/25/2019 CLINICAL DATA:  Shortness  of breath EXAM: PORTABLE CHEST 1 VIEW COMPARISON:  September 22, 2019 FINDINGS: Endotracheal tube and nasogastric tube no longer evident. Thoracic stimulator leads in midthoracic region. There is mild bibasilar atelectasis. Lungs elsewhere are clear. The heart size and pulmonary vascularity are normal. No adenopathy. There is aortic atherosclerosis. There is a stent in the left subclavian artery. There is postoperative change in the lower cervical region. IMPRESSION: No pneumothorax. Mild bibasilar atelectasis. No edema or airspace opacity. Stable cardiac silhouette. Aortic Atherosclerosis (ICD10-I70.0). Electronically Signed   By: Lowella Grip III M.D.   On: 09/25/2019 09:41   DG Chest Port 1 View  Result Date: 09/22/2019 CLINICAL DATA:  Hypoxia EXAM: PORTABLE CHEST 1 VIEW COMPARISON:  September 21, 2019 FINDINGS: Endotracheal tube tip is 8.4 cm above the carina. Nasogastric tube tip and side port are below the diaphragm. Stimulator leads are overlying the midthoracic region. No pneumothorax. There is atelectatic change in the lung bases with partial clearing of airspace opacity from the bases. No new opacity evident. Heart is upper normal in size with pulmonary vascularity normal. No adenopathy. There is a stent in the left subclavian artery region. There is aortic atherosclerosis. No bone lesions. IMPRESSION: Tube and catheter positions as described without pneumothorax. Bibasilar atelectasis with partial clearing of airspace opacity from the bases. No new opacity. Stable cardiac silhouette. Aortic Atherosclerosis (ICD10-I70.0). Electronically Signed   By: Lowella Grip III M.D.   On: 09/22/2019 08:05   DG Chest Portable 1 View  Result Date: 09/21/2019 CLINICAL DATA:  Post intubation. EXAM: PORTABLE CHEST 1 VIEW COMPARISON:  09/21/2019 FINDINGS: There is a worsening airspace opacity at the right lung base. The endotracheal tube terminates above the carina by approximately 4.5 cm. The enteric tube  appears to terminate at the level of the diaphragm. Coarse airspace opacities are noted at the lung bases. There is no pneumothorax or significant pleural effusion. A left subclavian artery stent is noted the lungs are hyperexpanded. Emphysematous changes are again noted. IMPRESSION: 1. Lines and tubes as above. The enteric tube is not appear to extend into the stomach. Repositioning is recommended. 2. New airspace opacity at the right lung base concerning for worsening atelectasis or a developing infiltrate. There is persistent atelectasis at the left lung base. These results will be called to the ordering clinician or representative by the Radiologist Assistant, and communication documented in the PACS or [employer]. Electronically Signed   By: Constance Holster M.D.   On: 09/21/2019 18:30   DG Chest Port 1 View  Result Date: 09/21/2019 CLINICAL DATA:  64 year old with possible sepsis and acute mental status changes. Patient lethargic and unresponsive. Hypotension. Current smoker. Follow-up pneumonia. EXAM: PORTABLE CHEST 1 VIEW COMPARISON:  09/12/2019 and earlier, including CTA chest 06/12/2019. FINDINGS: Cardiac silhouette normal in size for AP portable technique, unchanged. Thoracic aorta atherosclerotic, unchanged. Prominent central pulmonary arteries, unchanged. Since the most recent prior examination 9 days ago, improved aeration in the lung bases with near complete resolution of the  airspace opacities present at that time. Residual mild linear atelectasis at the RIGHT lung base. No new pulmonary parenchymal abnormalities. Emphysematous changes as noted on the prior CT. Dorsal cord stimulating device overlies the mid thoracic spine, unchanged. IMPRESSION: Near complete resolution of the bibasilar pneumonia since the most recent prior examination 9 days ago. Residual mild linear atelectasis at the RIGHT lung base. Electronically Signed   By: Evangeline Dakin M.D.   On: 09/21/2019 16:05   DG Chest  Portable 1 View  Result Date: 09/12/2019 CLINICAL DATA:  Shortness of breath and hypoxia. COPD. "Evaluate infiltrate " EXAM: PORTABLE CHEST 1 VIEW COMPARISON:  Most recent radiograph 06/14/2019 FINDINGS: Patchy opacity at both lung bases. Background bronchial thickening and interstitial coarsening. Stable heart size and mediastinal contours. Aortic atherosclerosis. Vascular stent in the region of the left subclavian. No pleural fluid or pneumothorax. Surgical hardware in the lower cervical spine partially included. Spinal stimulator in place. The bones appear under mineralized. IMPRESSION: Patchy opacity at both lung bases, may represent atelectasis, pneumonia, or aspiration. This has increased from imaging obtained 3 months ago. Electronically Signed   By: Keith Rake M.D.   On: 09/12/2019 21:28   ECHOCARDIOGRAM COMPLETE  Result Date: 09/14/2019    ECHOCARDIOGRAM REPORT   Patient Name:   Gregory Hall Date of Exam: 09/14/2019 Medical Rec #:  [id number]             Height:       74.0 in Accession #:    [id number]            Weight:       220.0 lb Date of Birth:  Dec 11, 1955             BSA:          2.263 m Patient Age:    24 years              BP:           93/60 mmHg Patient Gender: M                     HR:           91 bpm. Exam Location:  [facility] Procedure: 2D Echo and Intracardiac Opacification Agent Indications:     CHF  History:         Patient has prior history of Echocardiogram examinations. CAD,                  COPD; Risk Factors:Hypertension and Dyslipidemia.  Sonographer:     L Thornton-Maynard Referring Phys:  [id number] Eben Burow Diagnosing Phys: Kate Sable MD  Sonographer Comments: Technically difficult study due to poor echo windows. Image acquisition challenging due to COPD. IMPRESSIONS  1. Left ventricular ejection fraction, by estimation, is 65 to 70%. The left ventricle has normal function. The left ventricle has no regional wall motion abnormalities. Left ventricular  diastolic parameters are consistent with Grade II diastolic dysfunction (pseudonormalization).  2. Right ventricular systolic function is normal. The right ventricular size is normal. There is normal pulmonary artery systolic pressure.  3. The mitral valve is normal in structure. No evidence of mitral valve regurgitation.  4. The aortic valve was not well visualized. Aortic valve regurgitation not assessed.  5. Pulmonic valve regurgitation not assessed.  6. The inferior vena cava is normal in size with greater than 50% respiratory variability, suggesting right atrial pressure of 3 mmHg. FINDINGS  Left Ventricle: Left ventricular ejection  fraction, by estimation, is 65 to 70%. The left ventricle has normal function. The left ventricle has no regional wall motion abnormalities. Definity contrast agent was given IV to delineate the left ventricular  endocardial borders. The left ventricular internal cavity size was normal in size. There is no left ventricular hypertrophy. Left ventricular diastolic parameters are consistent with Grade II diastolic dysfunction (pseudonormalization). Right Ventricle: The right ventricular size is normal. Right vetricular wall thickness was not assessed. Right ventricular systolic function is normal. There is normal pulmonary artery systolic pressure. The tricuspid regurgitant velocity is 2.20 m/s, and with an assumed right atrial pressure of 3 mmHg, the estimated right ventricular systolic pressure is 62.8 mmHg. Left Atrium: Left atrial size was normal in size. Right Atrium: Right atrial size was normal in size. Pericardium: There is no evidence of pericardial effusion. Mitral Valve: The mitral valve is normal in structure. No evidence of mitral valve regurgitation. Tricuspid Valve: The tricuspid valve is grossly normal. Tricuspid valve regurgitation is mild. Aortic Valve: The aortic valve was not well visualized. Aortic valve regurgitation not assessed. Aortic valve peak gradient  measures 7.1 mmHg. Pulmonic Valve: The pulmonic valve was not well visualized. Pulmonic valve regurgitation not assessed. Aorta: The aortic root is normal in size and structure. Venous: The inferior vena cava is normal in size with greater than 50% respiratory variability, suggesting right atrial pressure of 3 mmHg. IAS/Shunts: No atrial level shunt detected by color flow Doppler.  LEFT VENTRICLE PLAX 2D LVOT diam:     2.40 cm  Diastology LVOT Area:     4.52 cm LV e' lateral:   6.85 cm/s                         LV E/e' lateral: 11.5                         LV e' medial:    3.48 cm/s                         LV E/e' medial:  22.6  AORTIC VALVE                PULMONIC VALVE AV Area (Vmax): 4.22 cm    PV Vmax:       1.06 m/s AV Vmax:        133.00 cm/s PV Peak grad:  4.5 mmHg AV Peak Grad:   7.1 mmHg LVOT Vmax:      124.00 cm/s  AORTA Ao Root diam: 3.60 cm MITRAL VALVE               TRICUSPID VALVE MV Area (PHT): 2.10 cm    TR Peak grad:   19.4 mmHg MV Decel Time: 362 msec    TR Vmax:        220.00 cm/s MV E velocity: 78.80 cm/s MV A velocity: 91.70 cm/s  SHUNTS MV E/A ratio:  0.86        Systemic Diam: 2.40 cm Kate Sable MD Electronically signed by Kate Sable MD Signature Date/Time: 09/14/2019/12:52:49 PM    Final    ECHOCARDIOGRAM LIMITED  Result Date: 09/26/2019    ECHOCARDIOGRAM LIMITED REPORT   Patient Name:   Gregory Hall Date of Exam: 09/26/2019 Medical Rec #:  [id number]             Height:       72.0 in Accession #:  [id number]            Weight:       217.1 lb Date of Birth:  Nov 19, 1955             BSA:          2.206 m Patient Age:    46 years              BP:           113/63 mmHg Patient Gender: M                     HR:           82 bpm. Exam Location:  [facility] Procedure: Limited Echo, Cardiac Doppler and Color Doppler Indications:     Atrial Flutter 427.32  History:         Patient has prior history of Echocardiogram examinations, most                  recent 09/14/2019. COPD,  Signs/Symptoms:Chest Pain; Risk                  Factors:Hypertension and Diabetes.  Sonographer:     Sherrie Sport RDCS (AE) Referring Phys:  [place] Diagnosing Phys: Ida Rogue MD  Sonographer Comments: No apical window. Image acquisition challenging due to COPD. IMPRESSIONS  1. Left ventricular ejection fraction, by estimation, is 55 to 60%. The left ventricle has normal function. The left ventricle has no regional wall motion abnormalities. Indeterminate diastolic filling due to E-A fusion.  2. Right ventricular systolic function is normal. The right ventricular size is normal. There is normal pulmonary artery systolic pressure.  3. The mitral valve was not well visualized. No evidence of mitral valve regurgitation. No evidence of mitral stenosis.  4. The aortic valve was not well visualized. Aortic valve regurgitation is not visualized. Mild aortic valve sclerosis is present, with no evidence of aortic valve stenosis.  5. Normal sinus rhythm. FINDINGS  Left Ventricle: Left ventricular ejection fraction, by estimation, is 55 to 60%. The left ventricle has normal function. The left ventricle has no regional wall motion abnormalities. The left ventricular internal cavity size was normal in size. There is  no left ventricular hypertrophy. Indeterminate diastolic filling due to E-A fusion. Right Ventricle: The right ventricular size is normal. No increase in right ventricular wall thickness. Right ventricular systolic function is normal. There is normal pulmonary artery systolic pressure. The tricuspid regurgitant velocity is 2.00 m/s, and  with an assumed right atrial pressure of 5 mmHg, the estimated right ventricular systolic pressure is 03.5 mmHg. Left Atrium: Left atrial size was normal in size. Right Atrium: Right atrial size was normal in size. Pericardium: There is no evidence of pericardial effusion. Mitral Valve: The mitral valve was not well visualized. No evidence of mitral valve  stenosis. Tricuspid Valve: The tricuspid valve is not well visualized. Tricuspid valve regurgitation is trivial. No evidence of tricuspid stenosis. Aortic Valve: The aortic valve was not well visualized. Aortic valve regurgitation is not visualized. Mild aortic valve sclerosis is present, with no evidence of aortic valve stenosis. Pulmonic Valve: The pulmonic valve was not well visualized. Pulmonic valve regurgitation is not visualized. Aorta: The aortic root was not well visualized, the ascending aorta was not well visualized and the aortic arch was not well visualized. Venous: The pulmonary veins were not well visualized. LEFT VENTRICLE PLAX 2D LVIDd:  4.13 cm LVIDs:         2.63 cm LV PW:         1.42 cm LV IVS:        1.11 cm LVOT diam:     2.00 cm LVOT Area:     3.14 cm  LEFT ATRIUM         Index LA diam:    4.00 cm 1.81 cm/m   AORTA Ao Root diam: 3.50 cm TRICUSPID VALVE TR Peak grad:   16.0 mmHg TR Vmax:        200.00 cm/s  SHUNTS Systemic Diam: 2.00 cm Ida Rogue MD Electronically signed by Ida Rogue MD Signature Date/Time: 09/26/2019/1:34:59 PM    Final (Updated)      Time coordinating discharge: Over 30 minutes    Dwyane Dee, MD  [facility] 09/28/2019, 1:55 PM Pager: Secure chat  If 7PM-7AM, please contact night-coverage [email or web address] Password [email or web address]

## 2019-09-28 NOTE — Progress Notes (Signed)
Progress Note  Patient Name: Gregory Hall Date of Encounter: 09/28/2019  [facility] Cardiologist: Kathlyn Sacramento, MD   Subjective   Feels okay, no acute events overnight.  Plans for discharge possibly today or tomorrow ongoing.  Feels much better with his heart rate controlled and in sinus rhythm  Inpatient Medications    Scheduled Meds: . amiodarone  400 mg Oral BID  . atorvastatin  40 mg Oral Daily  . [START ON 09/29/2019] bisoprolol  2.5 mg Oral Daily  . chlorhexidine gluconate (MEDLINE KIT)  15 mL Mouth Rinse BID  . feeding supplement (NEPRO CARB STEADY)  237 mL Oral BID BM  . furosemide  20 mg Oral Daily  . insulin aspart  0-5 Units Subcutaneous QHS  . insulin aspart  0-9 Units Subcutaneous TID WC  . ipratropium-albuterol  3 mL Nebulization TID  . midodrine  10 mg Oral Q8H  . nicotine  21 mg Transdermal Daily  . pantoprazole  40 mg Oral QHS  . pregabalin  200 mg Oral TID  . rivaroxaban  20 mg Oral Q supper   Continuous Infusions: . sodium chloride 0 mL (09/21/19 1624)   PRN Meds: albuterol, ALPRAZolam, docusate, melatonin, ondansetron (ZOFRAN) IV, oxyCODONE, polyethylene glycol, polyvinyl alcohol, sodium chloride flush   Vital Signs    Vitals:   09/28/19 0535 09/28/19 0750 09/28/19 0837 09/28/19 1124  BP:  (!) 100/55  (!) 117/50  Pulse:  63 65 62  Resp:  16 15 17   Temp:  98.4 F (36.9 C)  98.8 F (37.1 C)  TempSrc:  Oral  Oral  SpO2:  91% 93% 93%  Weight: 95.6 kg     Height:        Intake/Output Summary (Last 24 hours) at 09/28/2019 1156 Last data filed at 09/28/2019 1157 Gross per 24 hour  Intake 594 ml  Output 2355 ml  Net -1761 ml   Last 3 Weights 09/28/2019 09/27/2019 09/26/2019  Weight (lbs) 210 lb 12.2 oz 209 lb 9.6 oz 217 lb 1.6 oz  Weight (kg) 95.6 kg 95.074 kg 98.476 kg      Telemetry    Sinus rhythm- Personally Reviewed  ECG    New tracing obtained- Personally Reviewed  Physical Exam   GEN: No acute distress.   Neck:  No JVD Cardiac: RRR, no murmurs, rubs, or gallops.  Respiratory:  Decreased breath sounds at bases, poor inspiratory effort GI: Soft, nontender, non-distended  MS: No edema; ecchymosis on arms noted. Neuro:  Nonfocal  Psych: Normal affect   Labs    High Sensitivity Troponin:   Recent Labs  Lab 09/12/19 2043 09/21/19 1526 09/21/19 1920  TROPONINIHS 78* 29* 21*      Chemistry Recent Labs  Lab 09/21/19 1526 09/22/19 0515 09/23/19 1701 09/24/19 0421 09/26/19 0649 09/27/19 0601 09/28/19 0447  NA 129*   < > 144   < > 141 140 140  K 7.0*   < > 4.4   < > 4.0 4.0 4.8  CL 82*   < > 102   < > 100 98 97*  CO2 34*   < > 30   < > 31 32 36*  GLUCOSE 142*   < > 141*   < > 130* 166* 141*  BUN 127*   < > 26*   < > 11 10 13   CREATININE 7.07*   < > 1.12   < > 0.93 0.91 1.03  CALCIUM 8.0*   < > 8.1*   < >  8.5* 8.6* 9.0  PROT 6.1*  --  5.2*  --   --   --   --   ALBUMIN 3.1*  --  2.6*  --   --   --   --   AST 24  --  26  --   --   --   --   ALT 18  --  22  --   --   --   --   ALKPHOS 79  --  64  --   --   --   --   BILITOT 0.9  --  0.6  --   --   --   --   GFRNONAA 7*   < > >60   < > >60 >60 >60  GFRAA 9*   < > >60   < > >60 >60 >60  ANIONGAP 13   < > 12   < > 10 10 7    < > = values in this interval not displayed.     Hematology Recent Labs  Lab 09/26/19 0649 09/27/19 0601 09/28/19 0447  WBC 8.5 9.1 8.0  RBC 3.56* 3.59* 3.81*  HGB 10.0* 10.1* 10.5*  HCT 32.0* 31.2* 34.1*  MCV 89.9 86.9 89.5  MCH 28.1 28.1 27.6  MCHC 31.3 32.4 30.8  RDW 19.0* 18.8* 18.6*  PLT 196 207 242    BNP Recent Labs  Lab 09/21/19 1527  BNP 247.5*     DDimer No results for input(s): DDIMER in the last 168 hours.   Radiology    No results found.  Cardiac Studies   Echo 09/14/2019 1. Left ventricular ejection fraction, by estimation, is 65 to 70%. The  left ventricle has normal function. The left ventricle has no regional  wall motion abnormalities. Left ventricular diastolic parameters  are  consistent with Grade II diastolic  dysfunction (pseudonormalization).  2. Right ventricular systolic function is normal. The right ventricular  size is normal. There is normal pulmonary artery systolic pressure.  3. The mitral valve is normal in structure. No evidence of mitral valve  regurgitation.  4. The aortic valve was not well visualized. Aortic valve regurgitation  not assessed.  5. Pulmonic valve regurgitation not assessed.  6. The inferior vena cava is normal in size with greater than 50%  respiratory variability, suggesting right atrial pressure of 3 mmHg.   Patient Profile     64 y.o. male with history of CAD status post PCI, COPD, tobacco use, diabetes, hypertension presenting to the hospital with respiratory distress/failure secondary to COPD exacerbation.  Intubated subsequently extubated.  Developed atrial flutter with rapid ventricular response.  Assessment & Plan    1.  Atrial flutter s/p DCCV - currently in sinus rhythm -CHA2DS2-VASc score 4 -cont amiodarone 400 mg twice daily x1 week (5 more days), decrease to 200 mg twice daily after. -Continue bisoprolol 2.5 mg daily. -Xarelto 20 mg daily.  2.  Heart failure preserved ejection fraction grade 2 diastolic dysfunction. -oral lasix  3.  CAD status post PCI -Aspirin, Xarelto, Lipitor.  4.  COPD exacerbation, respiratory failure -Management as per primary team.  Patient is maintaining sinus rhythm.  Can be discharged from a cardiac perspective on above cardiac meds.  Close outpatient follow-up advised. Greater than 50% was spent in counseling and coordination of care with patient and hospitalist Total encounter time 35 minutes       Signed, Kate Sable, MD  09/28/2019, 11:56 AM

## 2019-10-01 NOTE — Progress Notes (Signed)
Cardiology Office Note    Date:  10/06/2019   ID:  Gregory Hall, DOB [birth year], MRN [id number]  PCP:  Koren Bound, NP  Cardiologist:  Lorine Bears, MD  Electrophysiologist:  None   Chief Complaint: Hospital follow up  History of Present Illness:   Gregory Hall is a 64 y.o. male with history of CAD s/p PCI to the LAD in 2010, HFpEF, atrial flutter s/p DCCV 09/26/2019, PAD with prior stenting to the left subclavian artery, CKD stage III, IDDM, HTN, HLD, psychiatric history, chronic hypoxic respiratory failure on supplemental home oxygen via nasal cannula at 2 L secondary to COPD secondary to tobacco use, esophageal spasm, erosive gastritis, GERD, anxiety/depression, and chronic back pain s/p multiple surgeries who presents for hospital follow up after recent admission to [facility] from 9/12 to 9/19 for metabolic encephalopathy secondary to acute on chronic hypoxic and hypercapneic respiratory failure requiring mechanical ventilation secondary to AECOPD and acute on chronic HFpEF, hypotension requiring vasopressor support, ARF requiring temporary dialysis, and new onset atrial flutter with RVR s/p DCCV.   He is known to have lower blood pressure in the left arm with carotid Doppler in 08/2014 showing mild nonhemodynamically significant bilateral carotid artery disease with no evidence of subclavian artery stenosis. Echo in 2016 for murmur, showed normal LVSF with no significant valvular abnormalities.   He was evaluated by Dr. Kirke Corin in 07/2015 for pre-procedure risk stratification prior to a colonoscopy. In this setting, he underwent a Lexiscan MPI, in 07/2015, that showed no significant ischemia with normal wall motion and was overall a low risk scan. Since that visit, he was admitted to [facility] in 10/2016 for chest pain, in the context of being unable to complete an outpatient dobutamine stress test. He had a negative troponin. He underwent LHC on 10/18/2016 that showed left main 30%  stenosis, patient proximal LAD stent with minor luminal irregularities involving the LAD, LCx, and RCA. LVEF 60%, and a mildly elevated LVEDP of 17 mmHg. He had been lost to follow up since. He was seen in the ED in March and May 2021 for falls. He was admitted to the hospital in 06/2019 with a COPD exacerbation.   He was admitted from 9/3 to 9/5 with acute on chronic respiratory failure with hypoxia and hypercapnia in the setting of AECOPD and acute on chronic HFpEF. He was treated for a COPD flare and diuresed. Echo on 09/14/2019 showed an EF of 65-70%, no RWMA, Gr2DD, normal RVSF with normal RV cavity size, normal PASP, and no significant valvular abnormalities.   Most recently, he was recently admitted to [facility] from 9/12 to 9/19 after being found unresponsive at his group home with noted hypoxia and hypotension. He briefly required mechanical ventilation, vasopressor support, and temporary dialysis with AKI. Symptoms were felt to be in the context of a COPD exacerbation. His EKG upon admission showed sinus rhythm, however, he subsequently went into atrial flutter with RVR, cardiology was consulted, and was placed on a Cardizem gtt. It was noted he was on Xarelto upon admission, though denied any history of atrial arrhtyhmias and indicated he was on this medication due to his history of CAD. It is not certain when, or where, OAC was started as he was not on this medication when we last saw him as an outpatient in 2017, though he was noted to be on Eliquis at time of discharge from [facility] in 10/2016. It appears, this was subsequently changed to Xarelto, though again the specifics of this  are not known. His ventricular rates remained difficult to control while admitted. He underwent successful DCCV on 09/26/2019. Limited echo on 09/26/2019 showed an EF of 55-60%, no RWMA, normal RVSF and ventricular cavity size, normal PASP, and no significant valvular abnormalities with a noted sinus rhythm. HS-Tn was minimally  elevated with a peak of 78 and felt to be related to supply demand ischemia secondary to the above presentation. His BP was soft during the admission and in this setting, he was placed on midodrine. Documented discharge weight of 95.6 kg.   He comes in doing reasonably well from a cardiac perspective.  He denies any dyspnea, chest pain, palpitations, dizziness, presyncope, or syncope.  He does note some continued lower extremity swelling though this is improved when compared to his hospital presentation.  He indicates this has been a longstanding issue for him and at times he will have to titrate his Lasix up to 80 mg daily for a brief time frame.  He does report elevating his legs when sitting.  He generally does not apply salt to food unless he is eating [place] fries which he eats approximately once every 2 weeks.  He did develop a resting tremor on 9/24 which is new for him.  No falls, hematochezia, or melena.   Labs independently reviewed: 09/2019 - magnesium 1.8, HGB 10.5, PLT 242, potassium 4.8, BUN 13, SCr 1.03, TC 97, TG 116, HDL 28, LDL 46, albumin 2.6, AST/ALT normal, TSH 0.256, A1c 7.0  Past Medical History:  Diagnosis Date  . AKI (acute kidney injury) (HCC) 09/12/2019  . CAD (coronary artery disease)    s/p PTCA and stent x2  . Chest pain   . CHF (congestive heart failure) (HCC)   . Chronic pain syndrome   . COPD (chronic obstructive pulmonary disease) (HCC)   . Degenerative cervical disc   . Depression   . Diabetes mellitus without complication (HCC)   . Dyslipidemia   . GERD (gastroesophageal reflux disease)   . Hernia 2014  . Hypertension   . MRSA (methicillin resistant staph aureus) culture positive 2011  . Neuropathy   . Nutcracker esophagus   . Rectus diastasis 07/19/2012    Past Surgical History:  Procedure Laterality Date  . BACK SURGERY  2012  . CARDIOVERSION N/A 09/26/2019   Procedure: CARDIOVERSION;  Surgeon: Antonieta Iba, MD;  Location: [facility] ORS;  Service:  Cardiovascular;  Laterality: N/A;  . CHOLECYSTECTOMY    . COLONOSCOPY  Jan 2014   [name]  . CORONARY ANGIOPLASTY WITH STENT PLACEMENT  2009   stents x2, in [place], [place]  . FOOT SURGERY     Right  . NECK SURGERY    . SPINE SURGERY  2012,2013  . TONSILLECTOMY      Current Medications: Current Meds  Medication Sig  . amiodarone (PACERONE) 200 MG tablet Take 1 tablet (200 mg total) by mouth 2 (two) times daily.  Marland Kitchen aspirin EC 81 MG tablet Take 81 mg by mouth daily.  Marland Kitchen atorvastatin (LIPITOR) 40 MG tablet Take 1 tablet (40 mg total) by mouth daily.  . baclofen (LIORESAL) 20 MG tablet Take 20 mg by mouth every 6 (six) hours.   . bisoprolol (ZEBETA) 5 MG tablet Take 0.5 tablets (2.5 mg total) by mouth daily.  . Cholecalciferol (VITAMIN D3) 50 MCG (2000 UT) TABS Take 2,000 Units by mouth daily.   . cyclobenzaprine (FLEXERIL) 5 MG tablet Take 1 tablet (5 mg total) by mouth 3 (three) times daily as needed. (Patient  taking differently: Take 5 mg by mouth 2 (two) times daily as needed for muscle spasms. )  . DULoxetine (CYMBALTA) 60 MG capsule Take 60 mg by mouth daily.  . Ensure Max Protein (ENSURE MAX PROTEIN) LIQD Take 330 mLs (11 oz total) by mouth 2 (two) times daily between meals.  . Fluticasone-Umeclidin-Vilant (TRELEGY ELLIPTA) 100-62.5-25 MCG/INH AEPB Inhale 1 puff into the lungs daily.  . furosemide (LASIX) 20 MG tablet Take 1 tablet (20 mg total) by mouth daily. (Patient taking differently: Take 20 mg by mouth daily as needed for fluid. )  . guaiFENesin (MUCINEX) 600 MG 12 hr tablet Take 600 mg by mouth 2 (two) times daily.  . insulin glargine (LANTUS) 100 UNIT/ML injection Inject 22 Units into the skin at bedtime.   . insulin lispro (HUMALOG) 100 UNIT/ML injection Inject 4 Units into the skin 3 (three) times daily before meals. Hold for FSBG <100  . ipratropium-albuterol (DUONEB) 0.5-2.5 (3) MG/3ML SOLN Take 3 mLs by nebulization every 6 (six) hours as needed. (Patient taking  differently: Take 3 mLs by nebulization every 6 (six) hours as needed (wheezing and shortness of breath). )  . lactulose (CHRONULAC) 10 GM/15ML solution Take 20 g by mouth daily as needed for mild constipation.  Marland Kitchen lamoTRIgine (LAMICTAL) 100 MG tablet Take 100 mg by mouth daily.  . Menthol-Methyl Salicylate (SALONPAS PAIN RELIEF PATCH EX) Place 1 patch onto the skin every 12 (twelve) hours as needed (aches and pains).  . metFORMIN (GLUCOPHAGE-XR) 750 MG 24 hr tablet Take 1 tablet (750 mg total) by mouth 2 (two) times daily.  . midodrine (PROAMATINE) 10 MG tablet Take 1 tablet (10 mg total) by mouth every 8 (eight) hours.  Marland Kitchen NAC 600 MG CAPS Take 600 mg by mouth daily.  Marland Kitchen NITROSTAT 0.4 MG SL tablet DISSOLVE (1) TABLET UNDER TONGUE AS NEEDED TO RELIEVE CHEST PAIN. MAYREPEAT EVERY 5 MINUTES. (Patient taking differently: Place 0.4 mg under the tongue every 5 (five) minutes as needed for chest pain. )  . ondansetron (ZOFRAN) 4 MG tablet Take 4 mg by mouth every 8 (eight) hours as needed for nausea or vomiting.   . Oxycodone HCl 10 MG TABS Take 1 tablet (10 mg total) by mouth 3 (three) times daily.  Marland Kitchen oxymetazoline (AFRIN) 0.05 % nasal spray Place 2 sprays into both nostrils 2 (two) times daily as needed for congestion.  . pantoprazole (PROTONIX) 40 MG tablet Take 40 mg by mouth daily.   . predniSONE (DELTASONE) 5 MG tablet Take 2.5 mg by mouth daily with breakfast.  . pregabalin (LYRICA) 200 MG capsule Take 1 capsule (200 mg total) by mouth 3 (three) times daily.  . rivaroxaban (XARELTO) 20 MG TABS tablet Take 20 mg by mouth daily with supper.  . roflumilast (DALIRESP) 500 MCG TABS tablet Take 250 mcg by mouth daily.  Marland Kitchen senna-docusate (SENOKOT-S) 8.6-50 MG tablet Take 1 tablet by mouth daily.   . traZODone (DESYREL) 100 MG tablet Take 100 mg by mouth at bedtime.    Allergies:   Acetaminophen and Nsaids   Social History   Socioeconomic History  . Marital status: Single    Spouse name: Not on file    . Number of children: Not on file  . Years of education: Not on file  . Highest education level: Not on file  Occupational History  . Not on file  Tobacco Use  . Smoking status: Current Every Day Smoker    Packs/day: 0.50    Years: 30.00  Pack years: 15.00  . Smokeless tobacco: Never Used  Substance and Sexual Activity  . Alcohol use: No  . Drug use: No  . Sexual activity: Not on file  Other Topics Concern  . Not on file  Social History Narrative  . Not on file   Social Determinants of Health   Financial Resource Strain:   . Difficulty of Paying Living Expenses: Not on file  Food Insecurity:   . Worried About Programme researcher, broadcasting/film/video in the Last Year: Not on file  . Ran Out of Food in the Last Year: Not on file  Transportation Needs:   . Lack of Transportation (Medical): Not on file  . Lack of Transportation (Non-Medical): Not on file  Physical Activity:   . Days of Exercise per Week: Not on file  . Minutes of Exercise per Session: Not on file  Stress:   . Feeling of Stress : Not on file  Social Connections:   . Frequency of Communication with Friends and Family: Not on file  . Frequency of Social Gatherings with Friends and Family: Not on file  . Attends Religious Services: Not on file  . Active Member of Clubs or Organizations: Not on file  . Attends Banker Meetings: Not on file  . Marital Status: Not on file     Family History:  The patient's Family history is unknown by patient.  ROS:   Review of Systems  Constitutional: Positive for malaise/fatigue. Negative for chills, diaphoresis, fever and weight loss.  HENT: Negative for congestion.   Eyes: Negative for discharge and redness.  Respiratory: Negative for cough, sputum production, shortness of breath and wheezing.   Cardiovascular: Positive for leg swelling. Negative for chest pain, palpitations, orthopnea, claudication and PND.  Gastrointestinal: Negative for abdominal pain, blood in stool,  heartburn, melena, nausea and vomiting.  Musculoskeletal: Negative for falls and myalgias.  Skin: Negative for rash.  Neurological: Positive for tremors and weakness. Negative for dizziness, tingling, sensory change, speech change, focal weakness and loss of consciousness.  Endo/Heme/Allergies: Does not bruise/bleed easily.  Psychiatric/Behavioral: Negative for substance abuse. The patient is not nervous/anxious.   All other systems reviewed and are negative.    EKGs/Labs/Other Studies Reviewed:    Studies reviewed were summarized above. The additional studies were reviewed today:  Limited 2D echo 09/26/2019: 1. Left ventricular ejection fraction, by estimation, is 55 to 60%. The  left ventricle has normal function. The left ventricle has no regional  wall motion abnormalities. Indeterminate diastolic filling due to E-A  fusion.  2. Right ventricular systolic function is normal. The right ventricular  size is normal. There is normal pulmonary artery systolic pressure.  3. The mitral valve was not well visualized. No evidence of mitral valve  regurgitation. No evidence of mitral stenosis.  4. The aortic valve was not well visualized. Aortic valve regurgitation  is not visualized. Mild aortic valve sclerosis is present, with no  evidence of aortic valve stenosis.  5. Normal sinus rhythm. _________  2D echo 09/14/2019: 1. Left ventricular ejection fraction, by estimation, is 65 to 70%. The  left ventricle has normal function. The left ventricle has no regional  wall motion abnormalities. Left ventricular diastolic parameters are  consistent with Grade II diastolic  dysfunction (pseudonormalization).  2. Right ventricular systolic function is normal. The right ventricular  size is normal. There is normal pulmonary artery systolic pressure.  3. The mitral valve is normal in structure. No evidence of mitral valve  regurgitation.  4. The aortic valve was not well visualized.  Aortic valve regurgitation  not assessed.  5. Pulmonic valve regurgitation not assessed.  6. The inferior vena cava is normal in size with greater than 50%  respiratory variability, suggesting right atrial pressure of 3 mmHg. __________  LHC 10/2016: Conclusions:   Nonobstructive coronary artery disease with wide open LAD stent. 30% in  the ostial left main   Normal left ventricle size and function with an ejection fraction of 60%   Mildly elevated left and end diastolic pressure at 17 mmHg   FINDINGS:   Dominance: Right   Left Main: 30% ostial left main.   LAD: LAD proximal stent is wide open. Minor luminal irregularities with  no crackles stenosis   CIRC: Minor luminal irregularities and no critical stenosis   RCA: Minor luminal irregularities with no crackles stenosis   Left Ventriculogram:  Normal left medical size and function with an  ejection fraction of 60%   Hemodynamics: Mildly elevated left and end diastolic pressure at 17 mmHg  __________  Eugenie Birks MPI 07/2015: Pharmacological myocardial perfusion imaging study with no significant  ischemia Normal wall motion, EF estimated at 92% No EKG changes concerning for ischemia at peak stress or in recovery. Baseline EKG with diffuse T wave abnormality Low risk scan __________  2D echo 08/2014: - Left ventricle: The cavity size was normal. Wall thickness was  normal. Systolic function was normal. The estimated ejection  fraction was in the range of 60% to 65%. Wall motion was normal;  there were no regional wall motion abnormalities. Left  ventricular diastolic function parameters were normal.   Impressions:   - Normal study.    EKG:  EKG is ordered today.  The EKG ordered today demonstrates NSR, 63 bpm, baseline artifact, nonspecific lateral ST-T changes which appear to be new when compared to prior  Recent Labs: 09/21/2019: B Natriuretic Peptide 247.5; TSH 0.256 09/23/2019: ALT 22 09/28/2019:  BUN 13; Creatinine, Ser 1.03; Hemoglobin 10.5; Magnesium 1.8; Platelets 242; Potassium 4.8; Sodium 140  Recent Lipid Panel    Component Value Date/Time   CHOL 97 09/25/2019 0545   CHOL 164 12/07/2014 0839   CHOL 96 08/24/2011 0110   TRIG 116 09/25/2019 0545   TRIG 169 08/24/2011 0110   HDL 28 (L) 09/25/2019 0545   HDL 31 (L) 12/07/2014 0839   HDL 19 (L) 08/24/2011 0110   CHOLHDL 3.5 09/25/2019 0545   VLDL 23 09/25/2019 0545   VLDL 34 08/24/2011 0110   LDLCALC 46 09/25/2019 0545   LDLCALC 109 (H) 12/07/2014 0839   LDLCALC 43 08/24/2011 0110    PHYSICAL EXAM:    VS:  BP 134/60 (BP Location: Right Arm, Patient Position: Sitting, Cuff Size: Normal)   Pulse 63   Ht 6\' 1"  (1.854 m)   Wt 207 lb 8 oz (94.1 kg)   SpO2 98%   BMI 27.38 kg/m   BMI: Body mass index is 27.38 kg/m.  Physical Exam Constitutional:      Appearance: He is well-developed.  HENT:     Head: Normocephalic and atraumatic.  Eyes:     General:        Right eye: No discharge.        Left eye: No discharge.  Neck:     Vascular: No JVD.  Cardiovascular:     Rate and Rhythm: Normal rate and regular rhythm.     Pulses: No midsystolic click and no opening snap.     Heart sounds:  Normal heart sounds, S1 normal and S2 normal. Heart sounds not distant. No murmur heard.  No friction rub.  Pulmonary:     Effort: Pulmonary effort is normal. No respiratory distress.     Breath sounds: Normal breath sounds. No decreased breath sounds, wheezing or rales.  Chest:     Chest wall: No tenderness.  Abdominal:     General: There is no distension.     Palpations: Abdomen is soft.     Tenderness: There is no abdominal tenderness.  Musculoskeletal:     Cervical back: Normal range of motion.     Right lower leg: Edema present.     Left lower leg: Edema present.     Comments: 1+ bilateral lower extremity pitting edema to the midshin  Skin:    General: Skin is warm and dry.     Nails: There is no clubbing.  Neurological:       Mental Status: He is alert and oriented to person, place, and time.     Motor: Tremor present.  Psychiatric:        Speech: Speech normal.        Behavior: Behavior normal.        Thought Content: Thought content normal.        Judgment: Judgment normal.     Wt Readings from Last 3 Encounters:  10/06/19 207 lb 8 oz (94.1 kg)  10/03/19 203 lb 2 oz (92.1 kg)  09/28/19 210 lb 12.2 oz (95.6 kg)     ASSESSMENT & PLAN:   1. CAD involving the native coronary arteries with elevated troponin and without angina: No symptoms concerning for angina.  He remains on aspirin, Lipitor, and bisoprolol.  Defer Lexiscan MPI given patient is asymptomatic and with new resting tremor.  This can be revisited in follow-up.  2. HFpEF: He appears euvolemic and well compensated.  There is some mild lower extremity pitting edema.  Historically, he indicates he has had to go up on his Lasix to 80 mg daily for several days to assist with this.  Given he was recently admitted and required temporary dialysis for AKI with low right heart pressures noted on echo we will defer titration of Lasix at this time and check a CMP.  Recommend leg elevation.  Consider compression stockings.  Recommend minimization of salt intake.  3. Atrial flutter: Maintaining sinus rhythm.  Continue rate control with bisoprolol.  Discontinue amiodarone given new onset tremor.  Continue Xarelto given CHA2DS2-VASc 4 (CHF, HTN, age x1, vascular disease).  No symptoms concerning for bleeding.  Check CBC.  4. HTN: Blood pressure well controlled.  He remains on midodrine 5 mg 3 times daily.  If BP starts to trend upwards this may need to be discontinued.  Continue bisoprolol.  5. HLD: LDL 46 from 09/2019 with normal liver function at that time.  Continue atorvastatin.  6. Left subclavian artery stenosis: Historically, blood pressure has always been lower in the left arm.  Asymptomatic.  7. Tobacco use/COPD: Complete cessation of tobacco use is  recommended.  No acute COPD exacerbation.  Follow-up with PCP as directed. 8. Tremor: Possibly in the setting of recently initiated amiodarone.  Discontinue amiodarone.  If tremor persists after washout, recommend he follow-up with PCP.  Disposition: F/u with Dr. Kirke Corin or an APP in 1 month.   Medication Adjustments/Labs and Tests Ordered: Current medicines are reviewed at length with the patient today.  Concerns regarding medicines are outlined above. Medication changes, Labs and Tests ordered  today are summarized above and listed in the Patient Instructions accessible in Encounters.   Signed, Eula Listen, PA-C 10/06/2019 8:04 AM     [facility] [address] [place], [place] [address] [phone]

## 2019-10-01 NOTE — Progress Notes (Signed)
Patient ID: Gregory Hall, male    DOB: 08-07-1955, 64 y.o.   MRN: [id number]  HPI  Gregory Hall is a 64 y/o male with a history of CAD, DM, HTN, CKD, dyslipidemia, GERD, COPD, depression, current tobacco use and chronic heart failure.   Echo report from 09/26/19 reviewed and showed an EF of 55-60%.  Admitted 09/21/19 due to being found unresponsive at his group home. Was intubated and given vasopressor support. Cardiology, nephrology and palliative care consults obtained. Respiratory failure thought to be due to COPD exacerbation. Cardizem infusion started due to new onset AF with RVR. Needed temporary dialysis due to renal failure. Cardioverted on 09/26/19. Started on amiodarone. Discharged after 7 days. Admitted 09/12/19 due to acute on chronic hypoxia due to HF/COPD exacerbation. Initially needed bipap. Given IV lasix with transition to oral diuretics. Given steroids, antibiotics and nebulizer treatments. Discharged after 2 days.   He presents today for his initial visit with a chief complaint of moderate fatigue with little exertion. He describes this as chronic in nature having been present for several years. He has associated dry cough, shortness of breath, pedal edema, easy bruising, anxiety and chronic pain along with this. He denies any difficulty sleeping, dizziness, abdominal distention, palpitations, chest pain or weight gain.   Says that he takes chronic narcotics for his back/ neck pain and that his BP is always on the low side.   Past Medical History:  Diagnosis Date  . AKI (acute kidney injury) (HCC) 09/12/2019  . CAD (coronary artery disease)    s/p PTCA and stent x2  . Chest pain   . CHF (congestive heart failure) (HCC)   . Chronic pain syndrome   . COPD (chronic obstructive pulmonary disease) (HCC)   . Degenerative cervical disc   . Depression   . Diabetes mellitus without complication (HCC)   . Dyslipidemia   . GERD (gastroesophageal reflux disease)   . Hernia 2014   . Hypertension   . MRSA (methicillin resistant staph aureus) culture positive 2011  . Neuropathy   . Nutcracker esophagus   . Rectus diastasis 07/19/2012   Past Surgical History:  Procedure Laterality Date  . BACK SURGERY  2012  . CARDIOVERSION N/A 09/26/2019   Procedure: CARDIOVERSION;  Surgeon: Antonieta Iba, MD;  Location: [facility] ORS;  Service: Cardiovascular;  Laterality: N/A;  . CHOLECYSTECTOMY    . COLONOSCOPY  Jan 2014   [name]  . CORONARY ANGIOPLASTY WITH STENT PLACEMENT  2009   stents x2, in [place], [place]  . FOOT SURGERY     Right  . NECK SURGERY    . SPINE SURGERY  2012,2013  . TONSILLECTOMY     Family History  Family history unknown: Yes   Social History   Tobacco Use  . Smoking status: Current Every Day Smoker    Packs/day: 0.50    Years: 30.00    Pack years: 15.00  . Smokeless tobacco: Never Used  Substance Use Topics  . Alcohol use: No   Allergies  Allergen Reactions  . Acetaminophen Other (See Comments)    Reaction:  Unknown  Kidney failure Told not to take from home M.D. Related to kidney and renal failure   . Nsaids Other (See Comments)    Reaction:  Unknown  Kidney failure Patient states not to take from home M.D. Related to kidney and renal failure.  Other reaction(s): Unknown   Prior to Admission medications   Medication Sig Start Date End Date Taking? Authorizing Provider  amiodarone (PACERONE) 200 MG tablet Take 1 tablet (200 mg total) by mouth 2 (two) times daily. 10/04/19  Yes Lewie Chamber, MD  aspirin EC 81 MG tablet Take 81 mg by mouth daily.   Yes [provider]  atorvastatin (LIPITOR) 40 MG tablet Take 1 tablet (40 mg total) by mouth daily. 09/28/19  Yes Lewie Chamber, MD  baclofen (LIORESAL) 20 MG tablet Take 20 mg by mouth every 8 (eight) hours. 09/02/19  Yes [provider]  bisoprolol (ZEBETA) 5 MG tablet Take 0.5 tablets (2.5 mg total) by mouth daily. 09/29/19  Yes Lewie Chamber, MD  Cholecalciferol  (VITAMIN D3) 50 MCG (2000 UT) TABS Take 2,000 Units by mouth daily.    Yes [provider]  cyclobenzaprine (FLEXERIL) 5 MG tablet Take 1 tablet (5 mg total) by mouth 3 (three) times daily as needed. Patient taking differently: Take 5 mg by mouth 2 (two) times daily as needed for muscle spasms.  05/25/19  Yes Menshew, Charlesetta Ivory, PA-C  DULoxetine (CYMBALTA) 60 MG capsule Take 60 mg by mouth daily.   Yes [provider]  Ensure Max Protein (ENSURE MAX PROTEIN) LIQD Take 330 mLs (11 oz total) by mouth 2 (two) times daily between meals. 06/14/19  Yes Sheikh, Omair Latif, DO  Fluticasone-Umeclidin-Vilant (TRELEGY ELLIPTA) 100-62.5-25 MCG/INH AEPB Inhale 1 puff into the lungs daily.   Yes [provider]  furosemide (LASIX) 20 MG tablet Take 1 tablet (20 mg total) by mouth daily. Patient taking differently: Take 20 mg by mouth daily as needed for fluid.  09/14/19  Yes Danford, Earl Lites, MD  guaiFENesin (MUCINEX) 600 MG 12 hr tablet Take 600 mg by mouth 2 (two) times daily.   Yes [provider]  insulin glargine (LANTUS) 100 UNIT/ML injection Inject 22 Units into the skin at bedtime.    Yes [provider]  insulin lispro (HUMALOG) 100 UNIT/ML injection Inject 4 Units into the skin 3 (three) times daily before meals. Hold for FSBG <100   Yes [provider]  ipratropium-albuterol (DUONEB) 0.5-2.5 (3) MG/3ML SOLN Take 3 mLs by nebulization every 6 (six) hours as needed. Patient taking differently: Take 3 mLs by nebulization every 6 (six) hours as needed (wheezing and shortness of breath).  06/14/19  Yes Sheikh, Omair Latif, DO  lactulose (CHRONULAC) 10 GM/15ML solution Take 20 g by mouth daily as needed for mild constipation.   Yes [provider]  lamoTRIgine (LAMICTAL) 100 MG tablet Take 100 mg by mouth daily.   Yes [provider]  Menthol-Methyl Salicylate (SALONPAS PAIN RELIEF PATCH EX) Place 1 patch onto the skin every 12  (twelve) hours as needed (aches and pains).   Yes [provider]  metFORMIN (GLUCOPHAGE-XR) 750 MG 24 hr tablet Take 1 tablet (750 mg total) by mouth 2 (two) times daily. 09/16/19  Yes Danford, Earl Lites, MD  midodrine (PROAMATINE) 10 MG tablet Take 1 tablet (10 mg total) by mouth every 8 (eight) hours. 09/28/19  Yes Lewie Chamber, MD  NAC 600 MG CAPS Take 600 mg by mouth daily. 09/08/19  Yes [provider]  NITROSTAT 0.4 MG SL tablet DISSOLVE (1) TABLET UNDER TONGUE AS NEEDED TO RELIEVE CHEST PAIN. MAYREPEAT EVERY 5 MINUTES. Patient taking differently: Place 0.4 mg under the tongue every 5 (five) minutes as needed for chest pain.  10/06/15  Yes Iran Ouch, MD  ondansetron (ZOFRAN) 4 MG tablet Take 4 mg by mouth every 8 (eight) hours as needed for nausea  or vomiting.    Yes [provider]  Oxycodone HCl 10 MG TABS Take 1 tablet (10 mg total) by mouth 3 (three) times daily. 09/14/19  Yes Danford, Earl Lites, MD  oxymetazoline (AFRIN) 0.05 % nasal spray Place 2 sprays into both nostrils 2 (two) times daily as needed for congestion.   Yes [provider]  pantoprazole (PROTONIX) 40 MG tablet Take 40 mg by mouth daily.    Yes [provider]  predniSONE (DELTASONE) 5 MG tablet Take 2.5 mg by mouth daily with breakfast.   Yes [provider]  pregabalin (LYRICA) 200 MG capsule Take 1 capsule (200 mg total) by mouth 3 (three) times daily. 09/14/19  Yes Danford, Earl Lites, MD  rivaroxaban (XARELTO) 20 MG TABS tablet Take 20 mg by mouth daily with supper.   Yes [provider]  roflumilast (DALIRESP) 500 MCG TABS tablet Take 250 mcg by mouth daily.   Yes [provider]  senna-docusate (SENOKOT-S) 8.6-50 MG tablet Take 1 tablet by mouth daily.    Yes [provider]  traZODone (DESYREL) 100 MG tablet Take 100 mg by mouth at bedtime. 09/08/19  Yes [provider]    Review of Systems  Constitutional:  Positive for fatigue (easily). Negative for appetite change.  HENT: Negative for congestion, postnasal drip and sore throat.   Eyes: Negative.   Respiratory: Positive for cough (dry cough) and shortness of breath (at times).   Cardiovascular: Positive for leg swelling. Negative for chest pain and palpitations.  Gastrointestinal: Negative for abdominal distention and abdominal pain.  Endocrine: Negative.   Genitourinary: Negative.   Musculoskeletal: Positive for back pain and neck pain.  Skin: Negative.   Allergic/Immunologic: Negative.   Neurological: Negative for dizziness and light-headedness.  Hematological: Negative for adenopathy. Bruises/bleeds easily.  Psychiatric/Behavioral: Negative for dysphoric mood and sleep disturbance (difficulty sleeping due pain). The patient is nervous/anxious.    Vitals:   10/03/19 1039  BP: (!) 73/53  Pulse: 63  Resp: 16  SpO2: 91%  Weight: 203 lb 2 oz (92.1 kg)  Height: 6' (1.829 m)   Wt Readings from Last 3 Encounters:  10/03/19 203 lb 2 oz (92.1 kg)  09/28/19 210 lb 12.2 oz (95.6 kg)  09/14/19 220 lb 0.3 oz (99.8 kg)   Lab Results  Component Value Date   CREATININE 1.03 09/28/2019   CREATININE 0.91 09/27/2019   CREATININE 0.93 09/26/2019    Physical Exam Vitals and nursing note reviewed.  Constitutional:      Appearance: Normal appearance.  HENT:     Head: Normocephalic and atraumatic.  Cardiovascular:     Rate and Rhythm: Normal rate and regular rhythm.  Pulmonary:     Effort: Pulmonary effort is normal. No respiratory distress.     Breath sounds: No wheezing or rales.  Abdominal:     General: There is no distension.     Palpations: Abdomen is soft.     Tenderness: There is no abdominal tenderness.  Musculoskeletal:        General: No tenderness.     Cervical back: Normal range of motion and neck supple.     Right lower leg: Edema (2+ pitting) present.     Left lower leg: Edema (2+ pitting) present.  Skin:    General:  Skin is warm and dry.  Neurological:     General: No focal deficit present.     Mental Status: He is alert and oriented to person, place, and time.  Psychiatric:  Mood and Affect: Mood normal.        Behavior: Behavior normal.     Assessment & Plan:  1: Chronic heart failure with preserved ejection fraction without structural changes- - NYHA class III - euvolemic today - getting weighed daily and he says that his weight has been stable; reminded to call or have staff call for an overnight weight gain of >2 pounds or a weekly weight gain of >5 pounds - says that he only adds salt to french fries which is 1-2 times/ month - group home has order already in place for furosemide 20mg  daily as needed although patient says that he's taken 80mg  daily the last 2 days - order written for compression socks to apply each morning with removal at bedtime - sees cardiology (D[place]) 10/06/19; could possibly benefit from changing diuretic to torsemide although will be limited due to BP  - BNP 09/21/19 was 247.5 - reports receiving both covid vaccines  2: HTN- - BP low but he says his runs low and he's currently without dizziness; already taking midodrine 10mg  TID; could be chronically low due to narcotic use - BMP 09/28/19 reviewed and showed sodium 140, potassium 4.8, creatinine 1.03 and GFR >60  3: DM- - A1c 09/13/19 was 7.0% - facility glucose was 128  4: COPD- - saw pulmonology Karna Christmas) 08/26/19   Group home medication list was reviewed.  Return in 2 months or sooner for any questions/problems before then.

## 2019-10-03 ENCOUNTER — Other Ambulatory Visit: Payer: Self-pay

## 2019-10-03 ENCOUNTER — Ambulatory Visit: Payer: Medicare Other | Attending: Family | Admitting: Family

## 2019-10-03 ENCOUNTER — Encounter: Payer: Self-pay | Admitting: Family

## 2019-10-03 VITALS — BP 73/53 | HR 63 | Resp 16 | Ht 72.0 in | Wt 203.1 lb

## 2019-10-03 DIAGNOSIS — Z7982 Long term (current) use of aspirin: Secondary | ICD-10-CM | POA: Insufficient documentation

## 2019-10-03 DIAGNOSIS — I5032 Chronic diastolic (congestive) heart failure: Secondary | ICD-10-CM | POA: Insufficient documentation

## 2019-10-03 DIAGNOSIS — G894 Chronic pain syndrome: Secondary | ICD-10-CM | POA: Diagnosis not present

## 2019-10-03 DIAGNOSIS — E1140 Type 2 diabetes mellitus with diabetic neuropathy, unspecified: Secondary | ICD-10-CM | POA: Insufficient documentation

## 2019-10-03 DIAGNOSIS — N189 Chronic kidney disease, unspecified: Secondary | ICD-10-CM | POA: Insufficient documentation

## 2019-10-03 DIAGNOSIS — Z886 Allergy status to analgesic agent status: Secondary | ICD-10-CM | POA: Insufficient documentation

## 2019-10-03 DIAGNOSIS — F419 Anxiety disorder, unspecified: Secondary | ICD-10-CM | POA: Diagnosis not present

## 2019-10-03 DIAGNOSIS — Z955 Presence of coronary angioplasty implant and graft: Secondary | ICD-10-CM | POA: Diagnosis not present

## 2019-10-03 DIAGNOSIS — E1122 Type 2 diabetes mellitus with diabetic chronic kidney disease: Secondary | ICD-10-CM | POA: Insufficient documentation

## 2019-10-03 DIAGNOSIS — I2510 Atherosclerotic heart disease of native coronary artery without angina pectoris: Secondary | ICD-10-CM | POA: Insufficient documentation

## 2019-10-03 DIAGNOSIS — I10 Essential (primary) hypertension: Secondary | ICD-10-CM

## 2019-10-03 DIAGNOSIS — E785 Hyperlipidemia, unspecified: Secondary | ICD-10-CM | POA: Insufficient documentation

## 2019-10-03 DIAGNOSIS — F329 Major depressive disorder, single episode, unspecified: Secondary | ICD-10-CM | POA: Insufficient documentation

## 2019-10-03 DIAGNOSIS — F17210 Nicotine dependence, cigarettes, uncomplicated: Secondary | ICD-10-CM | POA: Insufficient documentation

## 2019-10-03 DIAGNOSIS — J449 Chronic obstructive pulmonary disease, unspecified: Secondary | ICD-10-CM | POA: Diagnosis not present

## 2019-10-03 DIAGNOSIS — Z7901 Long term (current) use of anticoagulants: Secondary | ICD-10-CM | POA: Diagnosis not present

## 2019-10-03 DIAGNOSIS — Z79899 Other long term (current) drug therapy: Secondary | ICD-10-CM | POA: Insufficient documentation

## 2019-10-03 DIAGNOSIS — I130 Hypertensive heart and chronic kidney disease with heart failure and stage 1 through stage 4 chronic kidney disease, or unspecified chronic kidney disease: Secondary | ICD-10-CM | POA: Insufficient documentation

## 2019-10-03 DIAGNOSIS — Z794 Long term (current) use of insulin: Secondary | ICD-10-CM | POA: Diagnosis not present

## 2019-10-03 DIAGNOSIS — E119 Type 2 diabetes mellitus without complications: Secondary | ICD-10-CM

## 2019-10-03 DIAGNOSIS — Z7952 Long term (current) use of systemic steroids: Secondary | ICD-10-CM | POA: Insufficient documentation

## 2019-10-03 DIAGNOSIS — K219 Gastro-esophageal reflux disease without esophagitis: Secondary | ICD-10-CM | POA: Insufficient documentation

## 2019-10-03 DIAGNOSIS — Z9049 Acquired absence of other specified parts of digestive tract: Secondary | ICD-10-CM | POA: Insufficient documentation

## 2019-10-03 NOTE — Patient Instructions (Signed)
Continue weighing daily and call for an overnight weight gain of > 2 pounds or a weekly weight gain of >5 pounds. 

## 2019-10-06 ENCOUNTER — Ambulatory Visit (INDEPENDENT_AMBULATORY_CARE_PROVIDER_SITE_OTHER): Payer: Medicare Other | Admitting: Physician Assistant

## 2019-10-06 ENCOUNTER — Other Ambulatory Visit: Payer: Self-pay

## 2019-10-06 ENCOUNTER — Encounter: Payer: Self-pay | Admitting: Physician Assistant

## 2019-10-06 VITALS — BP 134/60 | HR 63 | Ht 73.0 in | Wt 207.5 lb

## 2019-10-06 DIAGNOSIS — I10 Essential (primary) hypertension: Secondary | ICD-10-CM | POA: Diagnosis not present

## 2019-10-06 DIAGNOSIS — I5031 Acute diastolic (congestive) heart failure: Secondary | ICD-10-CM

## 2019-10-06 DIAGNOSIS — E785 Hyperlipidemia, unspecified: Secondary | ICD-10-CM

## 2019-10-06 DIAGNOSIS — I5032 Chronic diastolic (congestive) heart failure: Secondary | ICD-10-CM | POA: Diagnosis not present

## 2019-10-06 DIAGNOSIS — I4892 Unspecified atrial flutter: Secondary | ICD-10-CM | POA: Diagnosis not present

## 2019-10-06 DIAGNOSIS — J449 Chronic obstructive pulmonary disease, unspecified: Secondary | ICD-10-CM

## 2019-10-06 DIAGNOSIS — I2510 Atherosclerotic heart disease of native coronary artery without angina pectoris: Secondary | ICD-10-CM | POA: Diagnosis not present

## 2019-10-06 DIAGNOSIS — R251 Tremor, unspecified: Secondary | ICD-10-CM

## 2019-10-06 DIAGNOSIS — I771 Stricture of artery: Secondary | ICD-10-CM

## 2019-10-06 NOTE — Patient Instructions (Signed)
Medication Instructions:  Your physician has recommended you make the following change in your medication:   STOP Amiodarone.  Continue all other medications as prescribed.  *If you need a refill on your cardiac medications before your next appointment, please call your pharmacy*   Lab Work: Cmet and Cbc today  If you have labs (blood work) drawn today and your tests are completely normal, you will receive your results only by: Marland Kitchen MyChart Message (if you have MyChart) OR . A paper copy in the mail If you have any lab test that is abnormal or we need to change your treatment, we will call you to review the results.   Testing/Procedures: None ordered   Follow-Up: At [facility], you and your health needs are our priority.  As part of our continuing mission to provide you with exceptional heart care, we have created designated Provider Care Teams.  These Care Teams include your primary Cardiologist (physician) and Advanced Practice Providers (APPs -  Physician Assistants and Nurse Practitioners) who all work together to provide you with the care you need, when you need it.  We recommend signing up for the patient portal called "MyChart".  Sign up information is provided on this After Visit Summary.  MyChart is used to connect with patients for Virtual Visits (Telemedicine).  Patients are able to view lab/test results, encounter notes, upcoming appointments, etc.  Non-urgent messages can be sent to your provider as well.   To learn more about what you can do with MyChart, go to [email or web address].    Your next appointment:   4 week(s)  The format for your next appointment:   In Person  Provider:   You may see Lorine Bears, MD or one of the following Advanced Practice Providers on your designated Care Team:    Nicolasa Ducking, NP  Eula Listen, PA-C  Marisue Ivan, PA-C  Cadence Fransico Michael, [place]    Other Instructions N/A

## 2019-10-07 ENCOUNTER — Telehealth: Payer: Self-pay

## 2019-10-07 LAB — CBC WITH DIFFERENTIAL/PLATELET
Basophils Absolute: 0.1 10*3/uL (ref 0.0–0.2)
Basos: 1 %
EOS (ABSOLUTE): 0.3 10*3/uL (ref 0.0–0.4)
Eos: 4 %
Hematocrit: 36.2 % — ABNORMAL LOW (ref 37.5–51.0)
Hemoglobin: 11.3 g/dL — ABNORMAL LOW (ref 13.0–17.7)
Immature Grans (Abs): 0 10*3/uL (ref 0.0–0.1)
Immature Granulocytes: 0 %
Lymphocytes Absolute: 2.3 10*3/uL (ref 0.7–3.1)
Lymphs: 29 %
MCH: 27.4 pg (ref 26.6–33.0)
MCHC: 31.2 g/dL — ABNORMAL LOW (ref 31.5–35.7)
MCV: 88 fL (ref 79–97)
Monocytes Absolute: 0.8 10*3/uL (ref 0.1–0.9)
Monocytes: 11 %
Neutrophils Absolute: 4.4 10*3/uL (ref 1.4–7.0)
Neutrophils: 55 %
Platelets: 470 10*3/uL — ABNORMAL HIGH (ref 150–450)
RBC: 4.12 x10E6/uL — ABNORMAL LOW (ref 4.14–5.80)
RDW: 15.7 % — ABNORMAL HIGH (ref 11.6–15.4)
WBC: 8 10*3/uL (ref 3.4–10.8)

## 2019-10-07 LAB — COMPREHENSIVE METABOLIC PANEL
ALT: 15 IU/L (ref 0–44)
AST: 19 IU/L (ref 0–40)
Albumin/Globulin Ratio: 1.7 (ref 1.2–2.2)
Albumin: 3.9 g/dL (ref 3.8–4.8)
Alkaline Phosphatase: 98 IU/L (ref 44–121)
BUN/Creatinine Ratio: 12 (ref 10–24)
BUN: 23 mg/dL (ref 8–27)
Bilirubin Total: 0.2 mg/dL (ref 0.0–1.2)
CO2: 32 mmol/L — ABNORMAL HIGH (ref 20–29)
Calcium: 8.7 mg/dL (ref 8.6–10.2)
Chloride: 95 mmol/L — ABNORMAL LOW (ref 96–106)
Creatinine, Ser: 1.99 mg/dL — ABNORMAL HIGH (ref 0.76–1.27)
GFR calc Af Amer: 39 mL/min/{1.73_m2} — ABNORMAL LOW (ref >59)
GFR calc non Af Amer: 34 mL/min/{1.73_m2} — ABNORMAL LOW (ref >59)
Globulin, Total: 2.3 g/dL (ref 1.5–4.5)
Glucose: 99 mg/dL (ref 65–99)
Potassium: 5 mmol/L (ref 3.5–5.2)
Sodium: 143 mmol/L (ref 134–144)
Total Protein: 6.2 g/dL (ref 6.0–8.5)

## 2019-10-07 NOTE — Telephone Encounter (Signed)
Drinda Butts is calling in stating that they need an order faxed over stating patient needs to stop amiodarone as it says in the AVS. She needs a signature for it to be valid Fax number is [phone]

## 2019-10-07 NOTE — Telephone Encounter (Addendum)
Order to stop Amiodarone signed by RD and faxed to Annette's attn. Fax confirmation received.

## 2019-10-07 NOTE — Telephone Encounter (Addendum)
Spoke with the Drinda Butts the nurse taking care of the patient at [facility]. Their phone and fax number are the same.  Annettte made aware of the patients lab results and Eula Listen, PA recommendation. Lab results and orders printed and faxed to Annette's attention. Fax confirmation received.  -Hold lasix for 2 days then resume as needed as instructed -Bmet next week (drawn there and results faxed to our office) -Compression stocking to be worn first thing in the morning and legs elevated.

## 2019-10-07 NOTE — Telephone Encounter (Signed)
-----   Message from Sondra Barges, PA-C sent at 10/07/2019  7:27 AM EDT ----- Blood count is mildly low though consistent with his prior readings Random glucose normal Renal function mildly elevated Potassium high normal Liver function normal  With mildly elevated renal function and higher than baseline platelet count it appears he may be hemoconcentrated/dehydrated.  Recommend he elevate his legs and try compression stockings first thing in the morning.  With mild AKI recommend he hold Lasix for 2 days and take as needed for weight gain greater than 3 pounds overnight, increase shortness of breath, or worsening lower extremity swelling.  His creatinine clearance does calculate out to be less than 50 though this is likely in the setting of AKI and therefore we will not yet reduce his Xarelto dose.  Recommend follow-up BMET next week to reassess his renal function.  If his creatinine clearance remains less than 50 at that time we will need to place him on renally dosed Xarelto.  Estimated Creatinine Clearance: 41.3 mL/min (A) (by C-G formula based on SCr of 1.99 mg/dL (H)).

## 2019-10-08 ENCOUNTER — Emergency Department: Payer: Medicare Other

## 2019-10-08 ENCOUNTER — Other Ambulatory Visit: Payer: Self-pay

## 2019-10-08 ENCOUNTER — Encounter: Payer: Self-pay | Admitting: Emergency Medicine

## 2019-10-08 ENCOUNTER — Inpatient Hospital Stay
Admission: EM | Admit: 2019-10-08 | Discharge: 2019-10-10 | DRG: 291 | Disposition: A | Discharge status: Other Acute Inpatient Hospital | Payer: Medicare Other | Attending: Internal Medicine | Admitting: Internal Medicine

## 2019-10-08 ENCOUNTER — Inpatient Hospital Stay: Payer: Medicare Other

## 2019-10-08 DIAGNOSIS — G894 Chronic pain syndrome: Secondary | ICD-10-CM | POA: Diagnosis present

## 2019-10-08 DIAGNOSIS — N183 Chronic kidney disease, stage 3 unspecified: Secondary | ICD-10-CM | POA: Diagnosis present

## 2019-10-08 DIAGNOSIS — I509 Heart failure, unspecified: Secondary | ICD-10-CM

## 2019-10-08 DIAGNOSIS — F17210 Nicotine dependence, cigarettes, uncomplicated: Secondary | ICD-10-CM | POA: Diagnosis present

## 2019-10-08 DIAGNOSIS — I4892 Unspecified atrial flutter: Secondary | ICD-10-CM | POA: Diagnosis present

## 2019-10-08 DIAGNOSIS — N179 Acute kidney failure, unspecified: Secondary | ICD-10-CM | POA: Diagnosis present

## 2019-10-08 DIAGNOSIS — I5033 Acute on chronic diastolic (congestive) heart failure: Secondary | ICD-10-CM | POA: Diagnosis present

## 2019-10-08 DIAGNOSIS — J449 Chronic obstructive pulmonary disease, unspecified: Secondary | ICD-10-CM | POA: Diagnosis present

## 2019-10-08 DIAGNOSIS — Z23 Encounter for immunization: Secondary | ICD-10-CM | POA: Diagnosis present

## 2019-10-08 DIAGNOSIS — J9611 Chronic respiratory failure with hypoxia: Secondary | ICD-10-CM

## 2019-10-08 DIAGNOSIS — Z886 Allergy status to analgesic agent status: Secondary | ICD-10-CM

## 2019-10-08 DIAGNOSIS — J9621 Acute and chronic respiratory failure with hypoxia: Secondary | ICD-10-CM | POA: Diagnosis present

## 2019-10-08 DIAGNOSIS — Z993 Dependence on wheelchair: Secondary | ICD-10-CM | POA: Diagnosis not present

## 2019-10-08 DIAGNOSIS — J189 Pneumonia, unspecified organism: Secondary | ICD-10-CM

## 2019-10-08 DIAGNOSIS — E1122 Type 2 diabetes mellitus with diabetic chronic kidney disease: Secondary | ICD-10-CM | POA: Diagnosis present

## 2019-10-08 DIAGNOSIS — E876 Hypokalemia: Secondary | ICD-10-CM | POA: Diagnosis present

## 2019-10-08 DIAGNOSIS — Z20822 Contact with and (suspected) exposure to covid-19: Secondary | ICD-10-CM | POA: Diagnosis present

## 2019-10-08 DIAGNOSIS — I5031 Acute diastolic (congestive) heart failure: Secondary | ICD-10-CM | POA: Diagnosis present

## 2019-10-08 DIAGNOSIS — K219 Gastro-esophageal reflux disease without esophagitis: Secondary | ICD-10-CM | POA: Diagnosis present

## 2019-10-08 DIAGNOSIS — F329 Major depressive disorder, single episode, unspecified: Secondary | ICD-10-CM | POA: Diagnosis present

## 2019-10-08 DIAGNOSIS — Z794 Long term (current) use of insulin: Secondary | ICD-10-CM

## 2019-10-08 DIAGNOSIS — E119 Type 2 diabetes mellitus without complications: Secondary | ICD-10-CM

## 2019-10-08 DIAGNOSIS — I130 Hypertensive heart and chronic kidney disease with heart failure and stage 1 through stage 4 chronic kidney disease, or unspecified chronic kidney disease: Secondary | ICD-10-CM | POA: Diagnosis present

## 2019-10-08 DIAGNOSIS — E785 Hyperlipidemia, unspecified: Secondary | ICD-10-CM | POA: Diagnosis present

## 2019-10-08 DIAGNOSIS — Z7952 Long term (current) use of systemic steroids: Secondary | ICD-10-CM

## 2019-10-08 DIAGNOSIS — F17200 Nicotine dependence, unspecified, uncomplicated: Secondary | ICD-10-CM | POA: Diagnosis not present

## 2019-10-08 DIAGNOSIS — Z79899 Other long term (current) drug therapy: Secondary | ICD-10-CM

## 2019-10-08 DIAGNOSIS — Z7982 Long term (current) use of aspirin: Secondary | ICD-10-CM

## 2019-10-08 DIAGNOSIS — Z955 Presence of coronary angioplasty implant and graft: Secondary | ICD-10-CM | POA: Diagnosis not present

## 2019-10-08 DIAGNOSIS — L03115 Cellulitis of right lower limb: Secondary | ICD-10-CM | POA: Diagnosis present

## 2019-10-08 DIAGNOSIS — R609 Edema, unspecified: Secondary | ICD-10-CM

## 2019-10-08 DIAGNOSIS — Z7901 Long term (current) use of anticoagulants: Secondary | ICD-10-CM

## 2019-10-08 DIAGNOSIS — F419 Anxiety disorder, unspecified: Secondary | ICD-10-CM | POA: Diagnosis present

## 2019-10-08 DIAGNOSIS — I2510 Atherosclerotic heart disease of native coronary artery without angina pectoris: Secondary | ICD-10-CM | POA: Diagnosis present

## 2019-10-08 LAB — COMPREHENSIVE METABOLIC PANEL
ALT: 13 U/L (ref 0–44)
AST: 16 U/L (ref 15–41)
Albumin: 3.7 g/dL (ref 3.5–5.0)
Alkaline Phosphatase: 84 U/L (ref 38–126)
Anion gap: 12 (ref 5–15)
BUN: 21 mg/dL (ref 8–23)
CO2: 33 mmol/L — ABNORMAL HIGH (ref 22–32)
Calcium: 8.7 mg/dL — ABNORMAL LOW (ref 8.9–10.3)
Chloride: 96 mmol/L — ABNORMAL LOW (ref 98–111)
Creatinine, Ser: 1.3 mg/dL — ABNORMAL HIGH (ref 0.61–1.24)
GFR calc Af Amer: >60 mL/min (ref >60)
GFR calc non Af Amer: 57 mL/min — ABNORMAL LOW (ref >60)
Glucose, Bld: 65 mg/dL — ABNORMAL LOW (ref 70–99)
Potassium: 4.3 mmol/L (ref 3.5–5.1)
Sodium: 141 mmol/L (ref 135–145)
Total Bilirubin: 0.6 mg/dL (ref 0.3–1.2)
Total Protein: 6.7 g/dL (ref 6.5–8.1)

## 2019-10-08 LAB — CK: Total CK: 81 U/L (ref 49–397)

## 2019-10-08 LAB — RESPIRATORY PANEL BY RT PCR (FLU A&B, COVID)
Influenza A by PCR: NEGATIVE
Influenza B by PCR: NEGATIVE
SARS Coronavirus 2 by RT PCR: NEGATIVE

## 2019-10-08 LAB — CBC
HCT: 36.3 % — ABNORMAL LOW (ref 39.0–52.0)
Hemoglobin: 10.9 g/dL — ABNORMAL LOW (ref 13.0–17.0)
MCH: 27.8 pg (ref 26.0–34.0)
MCHC: 30 g/dL (ref 30.0–36.0)
MCV: 92.6 fL (ref 80.0–100.0)
Platelets: 362 10*3/uL (ref 150–400)
RBC: 3.92 MIL/uL — ABNORMAL LOW (ref 4.22–5.81)
RDW: 18.1 % — ABNORMAL HIGH (ref 11.5–15.5)
WBC: 6.8 10*3/uL (ref 4.0–10.5)
nRBC: 0 % (ref 0.0–0.2)

## 2019-10-08 LAB — BRAIN NATRIURETIC PEPTIDE: B Natriuretic Peptide: 513.8 pg/mL — ABNORMAL HIGH (ref 0.0–100.0)

## 2019-10-08 LAB — PROCALCITONIN: Procalcitonin: <0.1 ng/mL

## 2019-10-08 LAB — TROPONIN I (HIGH SENSITIVITY)
Troponin I (High Sensitivity): 7 ng/L (ref <18)
Troponin I (High Sensitivity): 7 ng/L (ref <18)

## 2019-10-08 LAB — GLUCOSE, CAPILLARY: Glucose-Capillary: 84 mg/dL (ref 70–99)

## 2019-10-08 MED ORDER — PANTOPRAZOLE SODIUM 40 MG PO TBEC
40.0000 mg | DELAYED_RELEASE_TABLET | Freq: Every day | ORAL | Status: DC
  Administered 2019-10-09 – 2019-10-10 (×2): 40 mg via ORAL
  Filled 2019-10-08 (×2): qty 1

## 2019-10-08 MED ORDER — LAMOTRIGINE 100 MG PO TABS
100.0000 mg | ORAL_TABLET | Freq: Every day | ORAL | Status: DC
  Administered 2019-10-09 – 2019-10-10 (×2): 100 mg via ORAL
  Filled 2019-10-08 (×2): qty 1

## 2019-10-08 MED ORDER — RIVAROXABAN 20 MG PO TABS
20.0000 mg | ORAL_TABLET | Freq: Every day | ORAL | Status: DC
  Administered 2019-10-08 – 2019-10-09 (×2): 20 mg via ORAL
  Filled 2019-10-08 (×2): qty 1

## 2019-10-08 MED ORDER — IPRATROPIUM-ALBUTEROL 0.5-2.5 (3) MG/3ML IN SOLN
3.0000 mL | Freq: Four times a day (QID) | RESPIRATORY_TRACT | Status: DC | PRN
  Administered 2019-10-09 (×2): 3 mL via RESPIRATORY_TRACT
  Filled 2019-10-08 (×2): qty 3

## 2019-10-08 MED ORDER — OXYCODONE HCL 5 MG PO TABS
10.0000 mg | ORAL_TABLET | Freq: Three times a day (TID) | ORAL | Status: DC
  Administered 2019-10-08 – 2019-10-10 (×6): 10 mg via ORAL
  Filled 2019-10-08 (×6): qty 2

## 2019-10-08 MED ORDER — SODIUM CHLORIDE 0.9% FLUSH: 3.0000 mL | INTRAVENOUS | Status: DC | PRN

## 2019-10-08 MED ORDER — DULOXETINE HCL 30 MG PO CPEP
60.0000 mg | ORAL_CAPSULE | Freq: Every day | ORAL | Status: DC
  Administered 2019-10-09 – 2019-10-10 (×2): 60 mg via ORAL
  Filled 2019-10-08: qty 2
  Filled 2019-10-08: qty 1

## 2019-10-08 MED ORDER — ACETYLCYSTEINE 600 MG PO CAPS: 600.0000 mg | ORAL_CAPSULE | Freq: Every day | ORAL | Status: DC

## 2019-10-08 MED ORDER — SODIUM CHLORIDE 0.9% FLUSH
3.0000 mL | Freq: Two times a day (BID) | INTRAVENOUS | Status: DC
  Administered 2019-10-09 – 2019-10-10 (×2): 3 mL via INTRAVENOUS

## 2019-10-08 MED ORDER — NICOTINE 21 MG/24HR TD PT24
21.0000 mg | MEDICATED_PATCH | Freq: Every day | TRANSDERMAL | Status: DC
  Administered 2019-10-09 – 2019-10-10 (×2): 21 mg via TRANSDERMAL
  Filled 2019-10-08 (×2): qty 1

## 2019-10-08 MED ORDER — LACTULOSE 10 GM/15ML PO SOLN: 20.0000 g | Freq: Every day | ORAL | Status: DC | PRN

## 2019-10-08 MED ORDER — ONDANSETRON HCL 4 MG/2ML IJ SOLN
4.0000 mg | Freq: Four times a day (QID) | INTRAMUSCULAR | Status: DC | PRN
  Administered 2019-10-09: 4 mg via INTRAVENOUS
  Filled 2019-10-08: qty 2

## 2019-10-08 MED ORDER — SODIUM CHLORIDE 0.9 % IV SOLN: 250.0000 mL | INTRAVENOUS | Status: DC | PRN

## 2019-10-08 MED ORDER — TRAZODONE HCL 100 MG PO TABS
100.0000 mg | ORAL_TABLET | Freq: Every day | ORAL | Status: DC
  Administered 2019-10-08 – 2019-10-09 (×2): 100 mg via ORAL
  Filled 2019-10-08 (×2): qty 1

## 2019-10-08 MED ORDER — FLUTICASONE-UMECLIDIN-VILANT 100-62.5-25 MCG/INH IN AEPB: 1.0000 | INHALATION_SPRAY | Freq: Every day | RESPIRATORY_TRACT | Status: DC

## 2019-10-08 MED ORDER — TIOTROPIUM BROMIDE MONOHYDRATE 18 MCG IN CAPS: 18.0000 ug | ORAL_CAPSULE | Freq: Every day | RESPIRATORY_TRACT | Status: DC

## 2019-10-08 MED ORDER — ENSURE MAX PROTEIN PO LIQD
11.0000 [oz_av] | Freq: Two times a day (BID) | ORAL | Status: DC
  Administered 2019-10-09: 11 [oz_av] via ORAL
  Filled 2019-10-08: qty 330

## 2019-10-08 MED ORDER — AMIODARONE HCL 200 MG PO TABS
200.0000 mg | ORAL_TABLET | Freq: Every day | ORAL | Status: DC
  Administered 2019-10-08 – 2019-10-10 (×3): 200 mg via ORAL
  Filled 2019-10-08 (×5): qty 1

## 2019-10-08 MED ORDER — INSULIN ASPART 100 UNIT/ML ~~LOC~~ SOLN
0.0000 [IU] | SUBCUTANEOUS | Status: DC
  Administered 2019-10-09: 3 [IU] via SUBCUTANEOUS
  Administered 2019-10-09: 2 [IU] via SUBCUTANEOUS
  Administered 2019-10-09 (×2): 3 [IU] via SUBCUTANEOUS
  Administered 2019-10-10: 2 [IU] via SUBCUTANEOUS
  Administered 2019-10-10: 3 [IU] via SUBCUTANEOUS
  Filled 2019-10-08 (×6): qty 1

## 2019-10-08 MED ORDER — FUROSEMIDE 10 MG/ML IJ SOLN
40.0000 mg | Freq: Two times a day (BID) | INTRAMUSCULAR | Status: DC
  Administered 2019-10-09: 40 mg via INTRAVENOUS
  Filled 2019-10-08: qty 4

## 2019-10-08 MED ORDER — SODIUM CHLORIDE 0.9 % IV SOLN
2.0000 g | Freq: Once | INTRAVENOUS | Status: AC
  Administered 2019-10-08: 2 g via INTRAVENOUS
  Filled 2019-10-08: qty 20

## 2019-10-08 MED ORDER — MOMETASONE FURO-FORMOTEROL FUM 100-5 MCG/ACT IN AERO
2.0000 | INHALATION_SPRAY | Freq: Two times a day (BID) | RESPIRATORY_TRACT | Status: DC
  Administered 2019-10-09 – 2019-10-10 (×2): 2 via RESPIRATORY_TRACT
  Filled 2019-10-08 (×2): qty 8.8

## 2019-10-08 MED ORDER — OXYMETAZOLINE HCL 0.05 % NA SOLN
2.0000 | Freq: Two times a day (BID) | NASAL | Status: DC | PRN
  Filled 2019-10-08: qty 15

## 2019-10-08 MED ORDER — ATORVASTATIN CALCIUM 20 MG PO TABS
40.0000 mg | ORAL_TABLET | Freq: Every day | ORAL | Status: DC
  Administered 2019-10-08 – 2019-10-09 (×2): 40 mg via ORAL
  Filled 2019-10-08 (×2): qty 2

## 2019-10-08 MED ORDER — ASPIRIN EC 81 MG PO TBEC
81.0000 mg | DELAYED_RELEASE_TABLET | Freq: Every day | ORAL | Status: DC
  Administered 2019-10-08 – 2019-10-10 (×3): 81 mg via ORAL
  Filled 2019-10-08 (×3): qty 1

## 2019-10-08 MED ORDER — TAMSULOSIN HCL 0.4 MG PO CAPS
0.4000 mg | ORAL_CAPSULE | Freq: Every day | ORAL | Status: DC
  Administered 2019-10-09: 0.4 mg via ORAL
  Filled 2019-10-08: qty 1

## 2019-10-08 MED ORDER — MIDODRINE HCL 5 MG PO TABS
10.0000 mg | ORAL_TABLET | Freq: Three times a day (TID) | ORAL | Status: DC
  Administered 2019-10-09 – 2019-10-10 (×4): 10 mg via ORAL
  Filled 2019-10-08 (×7): qty 2

## 2019-10-08 MED ORDER — METOPROLOL SUCCINATE ER 50 MG PO TB24: 25.0000 mg | ORAL_TABLET | Freq: Every day | ORAL | Status: DC

## 2019-10-08 MED ORDER — UMECLIDINIUM BROMIDE 62.5 MCG/INH IN AEPB: 1.0000 | INHALATION_SPRAY | Freq: Every day | RESPIRATORY_TRACT | Status: DC

## 2019-10-08 MED ORDER — VITAMIN D3 25 MCG (1000 UNIT) PO TABS
2000.0000 [IU] | ORAL_TABLET | Freq: Every day | ORAL | Status: DC
  Administered 2019-10-08 – 2019-10-10 (×3): 2000 [IU] via ORAL
  Filled 2019-10-08 (×5): qty 2

## 2019-10-08 MED ORDER — HYDROMORPHONE HCL 1 MG/ML IJ SOLN
1.0000 mg | Freq: Once | INTRAMUSCULAR | Status: AC
  Administered 2019-10-08: 1 mg via INTRAVENOUS
  Filled 2019-10-08: qty 1

## 2019-10-08 MED ORDER — UMECLIDINIUM BROMIDE 62.5 MCG/INH IN AEPB
1.0000 | INHALATION_SPRAY | Freq: Every day | RESPIRATORY_TRACT | Status: DC
  Administered 2019-10-09 – 2019-10-10 (×2): 1 via RESPIRATORY_TRACT
  Filled 2019-10-08 (×2): qty 7

## 2019-10-08 MED ORDER — FUROSEMIDE 10 MG/ML IJ SOLN
40.0000 mg | Freq: Once | INTRAMUSCULAR | Status: AC
  Administered 2019-10-08: 40 mg via INTRAVENOUS
  Filled 2019-10-08: qty 4

## 2019-10-08 MED ORDER — BISOPROLOL FUMARATE 5 MG PO TABS
2.5000 mg | ORAL_TABLET | Freq: Every day | ORAL | Status: DC
  Filled 2019-10-08 (×3): qty 0.5

## 2019-10-08 MED ORDER — ROFLUMILAST 500 MCG PO TABS
250.0000 ug | ORAL_TABLET | Freq: Every day | ORAL | Status: DC
  Administered 2019-10-08 – 2019-10-10 (×3): 250 ug via ORAL
  Filled 2019-10-08 (×4): qty 1

## 2019-10-08 MED ORDER — BACLOFEN 10 MG PO TABS: 20.0000 mg | ORAL_TABLET | Freq: Four times a day (QID) | ORAL | Status: DC

## 2019-10-08 MED ORDER — ACETAMINOPHEN 325 MG PO TABS: 650.0000 mg | ORAL_TABLET | ORAL | Status: DC | PRN

## 2019-10-08 MED ORDER — PREGABALIN 50 MG PO CAPS
200.0000 mg | ORAL_CAPSULE | Freq: Three times a day (TID) | ORAL | Status: DC
  Administered 2019-10-08 – 2019-10-10 (×4): 200 mg via ORAL
  Filled 2019-10-08 (×3): qty 4
  Filled 2019-10-08: qty 1
  Filled 2019-10-08 (×2): qty 4

## 2019-10-08 MED ORDER — SODIUM CHLORIDE 0.9 % IV SOLN
500.0000 mg | Freq: Once | INTRAVENOUS | Status: AC
  Administered 2019-10-08: 500 mg via INTRAVENOUS
  Filled 2019-10-08: qty 500

## 2019-10-08 NOTE — ED Triage Notes (Signed)
Pt presents to ED via wheelchair from [facility] with abnormal EKG and SOB, pt states increasing SOB x 4 days, pt with hx of CHF, ppt also with end stage COPD, normally wears 3L O2, however arrives to ED with no O2 on.

## 2019-10-08 NOTE — ED Notes (Addendum)
Pt on phone with MRI

## 2019-10-08 NOTE — ED Provider Notes (Signed)
[facility] Emergency Department Provider Note  ____________________________________________  Time seen: Approximately 3:48 PM  I have reviewed the triage vital signs and the nursing notes.   HISTORY  Chief Complaint Shortness of Breath and Abnormal ECG    HPI Rigoverto Stonebarger is a 64 y.o. male with a history of CAD CHF COPD GERD hypertension who comes the ED complaining of worsening shortness of breath for the past 4 days.  He is wheelchair-bound at baseline, but finds that he gets very short of breath and weak with any kind of exertion or movement.  He is having to sit more upright in a recliner for sleep to get comfortable.  He has noticed decreased urine output over the last few days despite doubling his Lasix dose at home.  Denies chest pain.  Symptoms are constant, no alleviating factors.  Currently severe.  Notes that his weight has increased  4 to 5 pounds since he was last discharged from the hospital.     Past Medical History:  Diagnosis Date  . AKI (acute kidney injury) (HCC) 09/12/2019  . CAD (coronary artery disease)    s/p PTCA and stent x2  . Chest pain   . CHF (congestive heart failure) (HCC)   . Chronic pain syndrome   . COPD (chronic obstructive pulmonary disease) (HCC)   . Degenerative cervical disc   . Depression   . Diabetes mellitus without complication (HCC)   . Dyslipidemia   . GERD (gastroesophageal reflux disease)   . Hernia 2014  . Hypertension   . MRSA (methicillin resistant staph aureus) culture positive 2011  . Neuropathy   . Nutcracker esophagus   . Rectus diastasis 07/19/2012     Patient Active Problem List   Diagnosis Date Noted  . Atrial flutter (HCC)   . Acute on chronic respiratory failure with hypoxia (HCC) 09/13/2019  . Acute diastolic CHF (congestive heart failure) (HCC) 09/12/2019  . NSTEMI (non-ST elevated myocardial infarction) (HCC) 09/12/2019  . Acute kidney failure, unspecified (HCC) 09/12/2019   . Diabetes mellitus type 2, uncontrolled, with complications (HCC) 06/10/2019  . Chronic pain syndrome 06/10/2019  . COPD exacerbation (HCC) 06/10/2019  . COPD with acute exacerbation (HCC) 06/09/2019  . CAD (coronary artery disease) 06/09/2019  . HTN (hypertension) 06/09/2019  . Acute on chronic respiratory failure with hypoxia and hypercapnia (HCC) 06/09/2019  . Anxiety 06/09/2019  . Chronic prescription opiate use 06/09/2019  . Hyperglycemia 06/09/2019  . Subclavian artery stenosis, left (HCC) 08/20/2014  . Rectus diastasis 07/19/2012  . Hernia   . Edema 05/10/2011  . Goals of care, counseling/discussion 10/06/2010  . SMOKER 11/25/2009  . CAROTID BRUIT, RIGHT 11/24/2009  . Chest pain 11/24/2009  . Hyperlipidemia 03/30/2009  . Coronary atherosclerosis 03/30/2009  . HYPERTENSION, HX OF 03/30/2009     Past Surgical History:  Procedure Laterality Date  . BACK SURGERY  2012  . CARDIOVERSION N/A 09/26/2019   Procedure: CARDIOVERSION;  Surgeon: Antonieta Iba, MD;  Location: [facility] ORS;  Service: Cardiovascular;  Laterality: N/A;  . CHOLECYSTECTOMY    . COLONOSCOPY  Jan 2014   [name]  . CORONARY ANGIOPLASTY WITH STENT PLACEMENT  2009   stents x2, in [place], [place]  . FOOT SURGERY     Right  . NECK SURGERY    . SPINE SURGERY  2012,2013  . TONSILLECTOMY       Prior to Admission medications   Medication Sig Start Date End Date Taking? Authorizing Provider  aspirin EC 81 MG tablet  Take 81 mg by mouth daily.    [provider]  atorvastatin (LIPITOR) 40 MG tablet Take 1 tablet (40 mg total) by mouth daily. 09/28/19   Lewie Chamber, MD  baclofen (LIORESAL) 20 MG tablet Take 20 mg by mouth every 6 (six) hours.  09/02/19   [provider]  bisoprolol (ZEBETA) 5 MG tablet Take 0.5 tablets (2.5 mg total) by mouth daily. 09/29/19   Lewie Chamber, MD  Cholecalciferol (VITAMIN D3) 50 MCG (2000 UT) TABS Take 2,000 Units by mouth daily.     [provider]   cyclobenzaprine (FLEXERIL) 5 MG tablet Take 1 tablet (5 mg total) by mouth 3 (three) times daily as needed. Patient taking differently: Take 5 mg by mouth 2 (two) times daily as needed for muscle spasms.  05/25/19   Menshew, Charlesetta Ivory, PA-C  DULoxetine (CYMBALTA) 60 MG capsule Take 60 mg by mouth daily.    [provider]  Ensure Max Protein (ENSURE MAX PROTEIN) LIQD Take 330 mLs (11 oz total) by mouth 2 (two) times daily between meals. 06/14/19   Sheikh, Omair Latif, DO  Fluticasone-Umeclidin-Vilant (TRELEGY ELLIPTA) 100-62.5-25 MCG/INH AEPB Inhale 1 puff into the lungs daily.    [provider]  furosemide (LASIX) 20 MG tablet Take 1 tablet (20 mg total) by mouth daily. Patient taking differently: Take 20 mg by mouth daily as needed for fluid.  09/14/19   Danford, Earl Lites, MD  guaiFENesin (MUCINEX) 600 MG 12 hr tablet Take 600 mg by mouth 2 (two) times daily.    [provider]  insulin glargine (LANTUS) 100 UNIT/ML injection Inject 22 Units into the skin at bedtime.     [provider]  insulin lispro (HUMALOG) 100 UNIT/ML injection Inject 4 Units into the skin 3 (three) times daily before meals. Hold for FSBG <100    [provider]  ipratropium-albuterol (DUONEB) 0.5-2.5 (3) MG/3ML SOLN Take 3 mLs by nebulization every 6 (six) hours as needed. Patient taking differently: Take 3 mLs by nebulization every 6 (six) hours as needed (wheezing and shortness of breath).  06/14/19   Marguerita Merles Latif, DO  lactulose (CHRONULAC) 10 GM/15ML solution Take 20 g by mouth daily as needed for mild constipation.    [provider]  lamoTRIgine (LAMICTAL) 100 MG tablet Take 100 mg by mouth daily.    [provider]  loperamide (IMODIUM) 2 MG capsule Take by mouth. 10/07/19   [provider]  Menthol-Methyl Salicylate (SALONPAS PAIN RELIEF PATCH EX) Place 1 patch onto the skin every 12 (twelve) hours as needed (aches and pains).     [provider]  metFORMIN (GLUCOPHAGE-XR) 750 MG 24 hr tablet Take 1 tablet (750 mg total) by mouth 2 (two) times daily. 09/16/19   Danford, Earl Lites, MD  midodrine (PROAMATINE) 10 MG tablet Take 1 tablet (10 mg total) by mouth every 8 (eight) hours. 09/28/19   Lewie Chamber, MD  mupirocin ointment (BACTROBAN) 2 % Apply topically.    [provider]  NAC 600 MG CAPS Take 600 mg by mouth daily. 09/08/19   [provider]  nicotine (NICODERM CQ - DOSED IN MG/24 HOURS) 21 mg/24hr patch Place onto the skin.    [provider]  NITROSTAT 0.4 MG SL tablet DISSOLVE (1) TABLET UNDER TONGUE AS NEEDED TO RELIEVE CHEST PAIN. MAYREPEAT EVERY 5 MINUTES. Patient taking differently: Place 0.4 mg under the tongue every 5 (five) minutes as needed for chest pain.  10/06/15  Iran Ouch, MD  ondansetron (ZOFRAN) 4 MG tablet Take 4 mg by mouth every 8 (eight) hours as needed for nausea or vomiting.     [provider]  Oxycodone HCl 10 MG TABS Take 1 tablet (10 mg total) by mouth 3 (three) times daily. 09/14/19   Danford, Earl Lites, MD  oxymetazoline (AFRIN) 0.05 % nasal spray Place 2 sprays into both nostrils 2 (two) times daily as needed for congestion.    [provider]  pantoprazole (PROTONIX) 40 MG tablet Take 40 mg by mouth daily.     [provider]  predniSONE (DELTASONE) 5 MG tablet Take 2.5 mg by mouth daily with breakfast.    [provider]  pregabalin (LYRICA) 200 MG capsule Take 1 capsule (200 mg total) by mouth 3 (three) times daily. 09/14/19   Danford, Earl Lites, MD  rivaroxaban (XARELTO) 20 MG TABS tablet Take 20 mg by mouth daily with supper.    [provider]  roflumilast (DALIRESP) 500 MCG TABS tablet Take 250 mcg by mouth daily.    [provider]  senna-docusate (SENOKOT-S) 8.6-50 MG tablet Take 1 tablet by mouth daily.     [provider]  sulfamethoxazole-trimethoprim (BACTRIM DS)  800-160 MG tablet Take by mouth.    [provider]  traZODone (DESYREL) 100 MG tablet Take 100 mg by mouth at bedtime. 09/08/19   [provider]     Allergies Acetaminophen and Nsaids   Family History  Family history unknown: Yes    Social History Social History   Tobacco Use  . Smoking status: Current Every Day Smoker    Packs/day: 0.50    Years: 30.00    Pack years: 15.00  . Smokeless tobacco: Never Used  Substance Use Topics  . Alcohol use: No  . Drug use: No    Review of Systems  Constitutional:   No fever or chills.  Positive body aches ENT:   No sore throat. No rhinorrhea. Cardiovascular:   No chest pain or syncope. Respiratory: Positive shortness of breath without cough. Gastrointestinal:   Negative for abdominal pain, vomiting and diarrhea.  Musculoskeletal:   Negative for focal pain or swelling All other systems reviewed and are negative except as documented above in ROS and HPI.  ____________________________________________   PHYSICAL EXAM:  VITAL SIGNS: ED Triage Vitals  Enc Vitals Group     BP 10/08/19 1428 (!) 100/44     Pulse Rate 10/08/19 1428 (!) 56     Resp 10/08/19 1428 20     Temp 10/08/19 1428 98.3 F (36.8 C)     Temp Source 10/08/19 1428 Oral     SpO2 10/08/19 1434 93 %     Weight 10/08/19 1430 207 lb 8 oz (94.1 kg)     Height 10/08/19 1430 6\' 1"  (1.854 m)     Head Circumference --      Peak Flow --      Pain Score 10/08/19 1429 9     Pain Loc --      Pain Edu? --      Excl. in GC? --     Vital signs reviewed, nursing assessments reviewed.   Constitutional:   Alert and oriented. Non-toxic appearance. Eyes:   Conjunctivae are normal. EOMI. PERRL. ENT      Head:   Normocephalic and atraumatic.      Nose:   Wearing a mask.      Mouth/Throat:   Wearing a mask.  Neck:   No meningismus. Full ROM. Hematological/Lymphatic/Immunilogical:   No cervical lymphadenopathy. Cardiovascular:   RRR. Symmetric  bilateral radial and DP pulses.  No murmurs. Cap refill less than 2 seconds. Respiratory:   Normal respiratory effort without tachypnea/retractions.  Diffuse expiratory wheezing with normal expiratory phase. Gastrointestinal:   Soft and nontender. Non distended. There is no CVA tenderness.  No rebound, rigidity, or guarding.  Musculoskeletal:   Normal range of motion in all extremities. No joint effusions.  No lower extremity tenderness.  3+ pitting edema bilateral lower extremities.  Symmetric calf circumference. Neurologic:   Normal speech and language.  Motor grossly intact. No acute focal neurologic deficits are appreciated.  Skin:    Skin is warm, dry and intact. No rash noted.  No petechiae, purpura, or bullae.  ____________________________________________    LABS (pertinent positives/negatives) (all labs ordered are listed, but only abnormal results are displayed) Labs Reviewed  CBC - Abnormal; Notable for the following components:      Result Value   RBC 3.92 (*)    Hemoglobin 10.9 (*)    HCT 36.3 (*)    RDW 18.1 (*)    All other components within normal limits  COMPREHENSIVE METABOLIC PANEL - Abnormal; Notable for the following components:   Chloride 96 (*)    CO2 33 (*)    Glucose, Bld 65 (*)    Creatinine, Ser 1.30 (*)    Calcium 8.7 (*)    GFR calc non Af Amer 57 (*)    All other components within normal limits  RESPIRATORY PANEL BY RT PCR (FLU A&B, COVID)  CK  URINALYSIS, COMPLETE (UACMP) WITH MICROSCOPIC  PROCALCITONIN  BRAIN NATRIURETIC PEPTIDE  TROPONIN I (HIGH SENSITIVITY)  TROPONIN I (HIGH SENSITIVITY)   ____________________________________________   EKG  Interpreted by me Sinus rhythm rate of 58, normal axis and intervals.  Normal QRS.  There is some lateral ST depression.  Normal T waves.  ST depression is new compared to 2 days ago.  ____________________________________________    RADIOLOGY  DG Chest Portable 1 View  Result Date:  10/08/2019 CLINICAL DATA:  Shortness of breath.  History of CHF and COPD. EXAM: PORTABLE CHEST 1 VIEW COMPARISON:  Multiple priors, most recent 09/25/2019 FINDINGS: Patchy opacities in bilateral lung bases, new from prior. No visible pleural effusions. No pneumothorax. Similar cardiac silhouette. Aortic atherosclerosis. Left subclavian stent in similar position. Similar positioning of thoracic spinal stimulator leads in the midthoracic region. IMPRESSION: New bibasilar opacities, which may represent atelectasis, aspiration, and/or pneumonia. Electronically Signed   By: Feliberto Harts MD   On: 10/08/2019 15:57    ____________________________________________   PROCEDURES Procedures  ____________________________________________  DIFFERENTIAL DIAGNOSIS   CHF exacerbation, COPD exacerbation, pneumonia, Covid, pleural effusion, pulmonary edema  CLINICAL IMPRESSION / ASSESSMENT AND PLAN / ED COURSE  Medications ordered in the ED: Medications  cefTRIAXone (ROCEPHIN) 2 g in sodium chloride 0.9 % 100 mL IVPB (has no administration in time range)  azithromycin (ZITHROMAX) 500 mg in sodium chloride 0.9 % 250 mL IVPB (has no administration in time range)  furosemide (LASIX) injection 40 mg (40 mg Intravenous Given 10/08/19 1557)  HYDROmorphone (DILAUDID) injection 1 mg (1 mg Intravenous Given 10/08/19 1557)    Pertinent labs & imaging results that were available during my care of the patient were reviewed by me and considered in my medical decision making (see chart for details).  Gregory Hall was evaluated in Emergency Department on 10/08/2019 for the symptoms described in the history  of present illness. He was evaluated in the context of the global COVID-19 pandemic, which necessitated consideration that the patient might be at risk for infection with the SARS-CoV-2 virus that causes COVID-19. Institutional protocols and algorithms that pertain to the evaluation of patients at risk for  COVID-19 are in a state of rapid change based on information released by regulatory bodies including the CDC and federal and state organizations. These policies and algorithms were followed during the patient's care in the ED.   Patient presents with worsening shortness of breath and increasing weight gain, increasing peripheral edema for the past several days.  Most likely CHF exacerbation and volume overload.  Will give IV Lasix while waiting for labs and chest x-ray.  Give steroids and bronchodilators for underlying COPD as well.  Oxygenation appears stable on his baseline 3 L nasal cannula which achieves upper 90s oxygen saturation.  Clinical Course as of Oct 08 1730  Wed Oct 08, 2019  1715 Cxr reviewed by me, shows bilateral basilar infiltrates concerning for pnemonia. no UOP yet despite IV lasix.  With clinical signs of CHF exac and chronic resp failure, will plan to admit for continued IV diuresis, IV abx, resp monitoring until improving.   [PS]    Clinical Course User Index [PS] Sharman Cheek, MD     ----------------------------------------- 5:33 PM on 10/08/2019 -----------------------------------------  covid negative  ____________________________________________   FINAL CLINICAL IMPRESSION(S) / ED DIAGNOSES    Final diagnoses:  Acute on chronic congestive heart failure, unspecified heart failure type (HCC)  Type 2 diabetes mellitus without complication, with long-term current use of insulin (HCC)  Pneumonia of both lower lobes due to infectious organism  Chronic respiratory failure with hypoxia [facility])     ED Discharge Orders    None      Portions of this note were generated with dragon dictation software. Dictation errors may occur despite best attempts at proofreading.   Sharman Cheek, MD 10/08/19 [phone]

## 2019-10-08 NOTE — H&P (Addendum)
History and Physical    Gregory Hall ZOX:[id number] DOB: January 25, 1955 DOA: 10/08/2019  PCP: Enid Baas, MD  [facility] from: [facility]   Chief Complaint: leg swelling  HPI: Gregory Hall is a 64 y.o. male with history of CAD s/p PCI to the LAD in 2010, HFpEF, atrial flutter s/p DCCV 09/26/2019, PAD with prior stenting to the left subclavian artery, CKD stage III, IDDM, HTN, HLD, psychiatric history, chronic hypoxic respiratory failure on supplemental home oxygen via nasal cannula at 3 L secondary to COPD secondary to tobacco use, esophageal spasm, erosive gastritis, GERD, anxiety/depression, and chronic back pain  Went to [facility] pcp today and tells me he was told to go to the ER due to his bilateral lower extremity edema.  Patient was seen by his cardiologist on 10/06/2019 for hospital follow-up after recent admission to [facility] from  9/12 to 9/19 for metabolic encephalopathy secondary to acute on chronic hypoxic and hypercapneic respiratory failure requiring mechanical ventilation secondary to AECOPD and acute on chronic HFpEF, hypotension requiring vasopressor support, ARF requiring temporary dialysis, and new onset atrial flutter with RVR s/p DCCV.   He reports chronically he is on 3 L nasal cannula at home.  He lives in assisted living and reports compliance with his medications.  On 6 pillows due to his back pain and also shortness of breath.  This is chronic for him.  He is wheelchair-bound mostly and unable to tell me if he has dyspnea on exertion.  He does report mildly more short of breath than his baseline.  Denies any chest pain, palpitations, dizziness or lightheadedness.                                                                                                                          His recent echocardiogram revealed normal EF.  Grade 2 diastolic dysfunction.  ED Course: Blood pressure was 107/57, pulse 55, placed on 4 L nasal cannula satting 100%.  Evaded at 513 more than his baseline which 4 months ago was 51.  2 weeks ago was 247.  Was given azithromycin and ceftriaxone.  Also given 40 IV Lasix.  Chest x-ray revealed new bibasilar opacities, could represent atelectasis, aspiration, and or pneumonia.  I reviewed personally does have some pulmonary congestion.  I reviewed the sinus bradycardia with nonspecific T waves.  Troponin x2 negative.  Review of Systems: All systems reviewed and otherwise negative.    Past Medical History:  Diagnosis Date  . AKI (acute kidney injury) (HCC) 09/12/2019  . CAD (coronary artery disease)    s/p PTCA and stent x2  . Chest pain   . CHF (congestive heart failure) (HCC)   . Chronic pain syndrome   . COPD (chronic obstructive pulmonary disease) (HCC)   . Degenerative cervical disc   . Depression   . Diabetes mellitus without complication (HCC)   . Dyslipidemia   . GERD (gastroesophageal reflux disease)   . Hernia 2014  . Hypertension   . MRSA (methicillin resistant staph aureus) culture positive 2011  . Neuropathy   . Nutcracker esophagus   . Rectus diastasis 07/19/2012    Past Surgical History:  Procedure Laterality Date  . BACK SURGERY  2012  . CARDIOVERSION N/A 09/26/2019   Procedure: CARDIOVERSION;  Surgeon: Antonieta Iba, MD;  Location: [facility] ORS;  Service: Cardiovascular;  Laterality: N/A;  .  CHOLECYSTECTOMY    . COLONOSCOPY  Jan 2014   [name]  . CORONARY ANGIOPLASTY WITH STENT PLACEMENT  2009   stents x2, in [place], [place]  . FOOT SURGERY     Right  . NECK SURGERY    . SPINE SURGERY  2012,2013  . TONSILLECTOMY       reports that he has been smoking. He has a 15.00 pack-year smoking history. He has never used smokeless tobacco. He reports that he does not drink alcohol and does not use drugs.  Allergies  Allergen Reactions  . Acetaminophen Other (See Comments)    Reaction:  Unknown  Kidney failure Told not to take from home M.D. Related to kidney and renal failure   . Nsaids Other (See Comments)    Reaction:  Unknown  Kidney failure Patient states not to take from home M.D. Related to kidney and renal failure.  Other reaction(s): Unknown    Family History  Family history unknown: Yes     Prior to Admission medications   Medication Sig Start Date End Date Taking? Authorizing Provider  amiodarone (PACERONE) 200 MG tablet Take 200 mg by mouth daily.    [provider]  aspirin EC 81 MG tablet Take 81 mg by mouth daily.    [provider]  atorvastatin (LIPITOR) 40 MG tablet Take 1 tablet (40 mg total) by mouth daily. 09/28/19   Lewie Chamber, MD  baclofen (LIORESAL) 20 MG tablet Take 20 mg by mouth every 6 (six) hours.  09/02/19   [provider]  bisoprolol (ZEBETA) 5 MG tablet Take 0.5 tablets (2.5 mg total) by mouth daily. 09/29/19   Lewie Chamber, MD  Cholecalciferol (VITAMIN D3) 50 MCG (2000 UT) TABS Take 2,000 Units by mouth daily.     [provider]  cyclobenzaprine (FLEXERIL) 5 MG tablet Take 1 tablet (5 mg total) by mouth 3 (three) times daily as needed. Patient taking differently: Take 5 mg by mouth 2 (two) times daily as  needed for muscle spasms.  05/25/19   Menshew, Charlesetta Ivory, PA-C  DULoxetine (CYMBALTA) 60 MG capsule Take 60 mg by mouth daily.    [provider]  Ensure Max Protein (ENSURE MAX PROTEIN)  LIQD Take 330 mLs (11 oz total) by mouth 2 (two) times daily between meals. 06/14/19   Sheikh, Omair Latif, DO  Fluticasone-Umeclidin-Vilant (TRELEGY ELLIPTA) 100-62.5-25 MCG/INH AEPB Inhale 1 puff into the lungs daily.    [provider]  furosemide (LASIX) 20 MG tablet Take 1 tablet (20 mg total) by mouth daily. Patient taking differently: Take 20 mg by mouth daily as needed for fluid.  09/14/19   Danford, Earl Lites, MD  guaiFENesin (MUCINEX) 600 MG 12 hr tablet Take 600 mg by mouth 2 (two) times daily.    [provider]  insulin glargine (LANTUS) 100 UNIT/ML injection Inject 22 Units into the skin at bedtime.     [provider]  insulin lispro (HUMALOG) 100 UNIT/ML injection Inject 4 Units into the skin 3 (three) times daily before meals. Hold for FSBG <100    [provider]  ipratropium-albuterol (DUONEB) 0.5-2.5 (3) MG/3ML SOLN Take 3 mLs by nebulization every 6 (six) hours as needed. Patient taking differently: Take 3 mLs by nebulization every 6 (six) hours as needed (wheezing and shortness of breath).  06/14/19   Marguerita Merles Latif, DO  lactulose (CHRONULAC) 10 GM/15ML solution Take 20 g by mouth daily as needed for mild constipation.    [provider]  lamoTRIgine (LAMICTAL) 100 MG tablet Take 100 mg by mouth daily.    [provider]  loperamide (IMODIUM) 2 MG capsule Take by mouth. 10/07/19   [provider]  Menthol-Methyl Salicylate (SALONPAS PAIN RELIEF PATCH EX) Place 1 patch onto the skin every 12 (twelve) hours as needed (aches and pains).    [provider]  metFORMIN (GLUCOPHAGE-XR) 750 MG 24 hr tablet Take 1 tablet (750 mg total) by mouth 2 (two) times daily. 09/16/19   Danford, Earl Lites, MD  metoprolol succinate (TOPROL-XL) 25 MG 24 hr tablet Take by mouth.    [provider]  midodrine (PROAMATINE) 10 MG tablet Take 1 tablet (10 mg total) by mouth every 8 (eight) hours. 09/28/19   Lewie Chamber,  MD  mupirocin ointment (BACTROBAN) 2 % Apply topically.    [provider]  NAC 600 MG CAPS Take 600 mg by mouth daily. 09/08/19   [provider]  nicotine (NICODERM CQ - DOSED IN MG/24 HOURS) 21 mg/24hr patch Place onto the skin.    [provider]  NITROSTAT 0.4 MG SL tablet DISSOLVE (1) TABLET UNDER TONGUE AS NEEDED TO RELIEVE CHEST PAIN. MAYREPEAT EVERY 5 MINUTES. Patient taking differently: Place 0.4 mg under the tongue every 5 (five) minutes as needed for chest pain.  10/06/15   Iran Ouch, MD  ondansetron (ZOFRAN) 4 MG tablet Take 4 mg by mouth every 8 (eight) hours as needed for nausea or vomiting.     [provider]  Oxycodone HCl 10 MG TABS Take 1 tablet (10 mg total) by mouth 3 (three) times daily. 09/14/19   Danford, Earl Lites, MD  oxymetazoline (AFRIN) 0.05 % nasal spray Place 2 sprays into both nostrils 2 (two) times daily as needed for congestion.    [provider]  pantoprazole (PROTONIX) 40 MG tablet Take 40 mg by mouth daily.     [provider]  potassium chloride SA (KLOR-CON) 20 MEQ tablet Take by  mouth.    [provider]  predniSONE (DELTASONE) 5 MG tablet Take 2.5 mg by mouth daily with breakfast.    [provider]  pregabalin (LYRICA) 200 MG capsule Take 1 capsule (200 mg total) by mouth 3 (three) times daily. 09/14/19   Danford, Earl Lites, MD  rivaroxaban (XARELTO) 20 MG TABS tablet Take 20 mg by mouth daily with supper.    [provider]  roflumilast (DALIRESP) 500 MCG TABS tablet Take 250 mcg by mouth daily.    [provider]  senna-docusate (SENOKOT-S) 8.6-50 MG tablet Take 1 tablet by mouth daily.     [provider]  tamsulosin (FLOMAX) 0.4 MG CAPS capsule Take by mouth.    [provider]  tiotropium (SPIRIVA) 18 MCG inhalation capsule Place into inhaler and inhale.    [provider]  traZODone (DESYREL) 100 MG tablet Take 100 mg by  mouth at bedtime. 09/08/19   [provider]    Physical Exam: Vitals:   10/08/19 1700 10/08/19 1730 10/08/19 1800 10/08/19 1830  BP: (!) 111/56 (!) 105/54 (!) 121/59 110/64  Pulse: (!) 55 (!) 56 (!) 53 (!) 54  Resp: (!) 8 (!) 8 (!) 8 10  Temp:      TempSrc:      SpO2: 97% 99% 100% 100%  Weight:      Height:        Constitutional: NAD, calm, comfortable Vitals:   10/08/19 1700 10/08/19 1730 10/08/19 1800 10/08/19 1830  BP: (!) 111/56 (!) 105/54 (!) 121/59 110/64  Pulse: (!) 55 (!) 56 (!) 53 (!) 54  Resp: (!) 8 (!) 8 (!) 8 10  Temp:      TempSrc:      SpO2: 97% 99% 100% 100%  Weight:      Height:       Eyes: PERRL, lids and conjunctivae normal ENMT:mask on Neck: normal, supple, no jvd Respiratory: forced expiratory wheeze, +crackles scattered bilateral. No rhonchi Cardiovascular: Regular no murmurs / rubs / gallops.  Abdomen: no tenderness, no masses palpated.  Bowel sounds positive.  Musculoskeletal: +3pitting edema b/l LE up to shins. Rubor b/l LE. Denies calf pain. Foot with profound swelling Skin: ecchymosis especially b/l UE. Neurologic: CN 2-12 grossly intact.  Strength 5/5 in all 4.  Psychiatric: Normal judgment and insight. Alert and oriented x 3. Normal mood.    Labs on Admission: I have personally reviewed following labs and imaging studies  CBC: Recent Labs  Lab 10/06/19 0829 10/08/19 1530  WBC 8.0 6.8  NEUTROABS 4.4  --   HGB 11.3* 10.9*  HCT 36.2* 36.3*  MCV 88 92.6  PLT 470* 362   Basic Metabolic Panel: Recent Labs  Lab 10/06/19 0829 10/08/19 1530  NA 143 141  K 5.0 4.3  CL 95* 96*  CO2 32* 33*  GLUCOSE 99 65*  BUN 23 21  CREATININE 1.99* 1.30*  CALCIUM 8.7 8.7*   GFR: Estimated Creatinine Clearance: 63.2 mL/min (A) (by C-G formula based on SCr of 1.3 mg/dL (H)). Liver Function Tests: Recent Labs  Lab 10/06/19 0829 10/08/19 1530  AST 19 16  ALT 15 13  ALKPHOS 98 84  BILITOT 0.2 0.6  PROT 6.2 6.7  ALBUMIN 3.9 3.7    No results for input(s): LIPASE, AMYLASE in the last 168 hours. No results for input(s): AMMONIA in the last 168 hours. Coagulation Profile: No results for input(s): INR, PROTIME in the last 168 hours. Cardiac Enzymes: Recent Labs  Lab 10/08/19  1530  CKTOTAL 81   BNP (last 3 results) No results for input(s): PROBNP in the last 8760 hours. HbA1C: No results for input(s): HGBA1C in the last 72 hours. CBG: No results for input(s): GLUCAP in the last 168 hours. Lipid Profile: No results for input(s): CHOL, HDL, LDLCALC, TRIG, CHOLHDL, LDLDIRECT in the last 72 hours. Thyroid Function Tests: No results for input(s): TSH, T4TOTAL, FREET4, T3FREE, THYROIDAB in the last 72 hours. Anemia Panel: No results for input(s): VITAMINB12, FOLATE, FERRITIN, TIBC, IRON, RETICCTPCT in the last 72 hours. Urine analysis:    Component Value Date/Time   COLORURINE YELLOW (A) 09/21/2019 1525   APPEARANCEUR HAZY (A) 09/21/2019 1525   APPEARANCEUR Clear 10/28/2013 2215   LABSPEC 1.016 09/21/2019 1525   LABSPEC 1.002 10/28/2013 2215   PHURINE 5.0 09/21/2019 1525   GLUCOSEU NEGATIVE 09/21/2019 1525   GLUCOSEU Negative 10/28/2013 2215   HGBUR NEGATIVE 09/21/2019 1525   BILIRUBINUR NEGATIVE 09/21/2019 1525   BILIRUBINUR Negative 10/28/2013 2215   KETONESUR NEGATIVE 09/21/2019 1525   PROTEINUR NEGATIVE 09/21/2019 1525   UROBILINOGEN 1.0 03/29/2009 0840   NITRITE NEGATIVE 09/21/2019 1525   LEUKOCYTESUR NEGATIVE 09/21/2019 1525   LEUKOCYTESUR Negative 10/28/2013 2215    Radiological Exams on Admission: DG Chest Portable 1 View  Result Date: 10/08/2019 CLINICAL DATA:  Shortness of breath.  History of CHF and COPD. EXAM: PORTABLE CHEST 1 VIEW COMPARISON:  Multiple priors, most recent 09/25/2019 FINDINGS: Patchy opacities in bilateral lung bases, new from prior. No visible pleural effusions. No pneumothorax. Similar cardiac silhouette. Aortic atherosclerosis. Left subclavian stent in similar position.  Similar positioning of thoracic spinal stimulator leads in the midthoracic region. IMPRESSION: New bibasilar opacities, which may represent atelectasis, aspiration, and/or pneumonia. Electronically Signed   By: Feliberto Harts MD   On: 10/08/2019 15:57      Assessment/Plan Active Problems:   Hyperlipidemia   SMOKER   CAD (coronary artery disease)   Chronic pain syndrome   Acute diastolic CHF (congestive heart failure) (HCC)   Acute kidney failure, unspecified (HCC)   Acute CHF (congestive heart failure) (HCC)   Chronic respiratory failure with hypoxia (HCC)   1. Acute on chronic diastolic HF-  Appears volume overloaded, BNP elevated from baseline. In the ED given Lasix 40 mg IV x1 Will place on Lasix 40 mg IV twice daily Continue home meds Strict I's and O's Daily weight Will need cardiology consultation in a.m.  2.recent h/xo aflutter -s/p DCCV 09/26/2019. Currently in sinus bradycardia  continue with Xarelto , beta blk, and amiodarone.   3.Smoker- on nicotine patch, will continue. Counseled pt about smoking.   4. COPD-currently no exacerbation  continue inhalers.   5.CAD- no cp.  Negative troponin.  Continue asa, statin, and bisoprolol  6.?PNA- based on cxr. No leukocytosis. Afebrile.  Given ceftriaxone and azithromycin. Will continue and reevaluate in am, if clinically improving with lasix, would d/c abx.  7.IDDM-A1c improved, at 7.0 We will hold home dose insulin. Place on R ISS, hypoglycemic protocol Accu-Cheks If his p.o. intake is good can resume his home dose slowly  8.chronic back pain- on chronic pain med.   9.Chronic respiratory failure with hypoxia- on chronic 02 3L. Currently placed on 4L sating 100%. Will wean to his baseline.  10.AKI- possibly prerenal from chf. Monitor closely while diuresing Avoid hypotension  11..will consult pharmacy for review of pt's medications/med rec reconciliation.   DVT prophylaxis: xarelto Code Status: Full code  verified by pt. (pt stated was DNR in the past,  but wants to be full code now)  Family Communication: none at bedside  Disposition Plan: back to assisted living  Consults called:  Need to call Cardiology in am  Admission status: Inpatient status as patient requires more than 2 midnight stays for current medical acuity issues   Lynn Ito MD [facility] Pager [phone]   If 7PM-7AM, please contact night-coverage [email or web address] Password [email or web address]  10/08/2019, 6:50 PM

## 2019-10-08 NOTE — ED Triage Notes (Signed)
Pt sent over from [facility] with c/o SOB and abnormal EKG

## 2019-10-09 DIAGNOSIS — L03115 Cellulitis of right lower limb: Secondary | ICD-10-CM

## 2019-10-09 DIAGNOSIS — E785 Hyperlipidemia, unspecified: Secondary | ICD-10-CM

## 2019-10-09 LAB — BASIC METABOLIC PANEL
Anion gap: 13 (ref 5–15)
BUN: 16 mg/dL (ref 8–23)
CO2: 35 mmol/L — ABNORMAL HIGH (ref 22–32)
Calcium: 8.3 mg/dL — ABNORMAL LOW (ref 8.9–10.3)
Chloride: 95 mmol/L — ABNORMAL LOW (ref 98–111)
Creatinine, Ser: 1.12 mg/dL (ref 0.61–1.24)
GFR calc Af Amer: >60 mL/min (ref >60)
GFR calc non Af Amer: >60 mL/min (ref >60)
Glucose, Bld: 104 mg/dL — ABNORMAL HIGH (ref 70–99)
Potassium: 3.6 mmol/L (ref 3.5–5.1)
Sodium: 143 mmol/L (ref 135–145)

## 2019-10-09 LAB — URINALYSIS, COMPLETE (UACMP) WITH MICROSCOPIC
Bacteria, UA: NONE SEEN
Bilirubin Urine: NEGATIVE
Glucose, UA: NEGATIVE mg/dL
Hgb urine dipstick: NEGATIVE
Ketones, ur: NEGATIVE mg/dL
Leukocytes,Ua: NEGATIVE
Nitrite: NEGATIVE
Protein, ur: NEGATIVE mg/dL
Specific Gravity, Urine: 1.009 (ref 1.005–1.030)
Squamous Epithelial / HPF: NONE SEEN (ref 0–5)
pH: 6 (ref 5.0–8.0)

## 2019-10-09 LAB — GLUCOSE, CAPILLARY
Glucose-Capillary: 100 mg/dL — ABNORMAL HIGH (ref 70–99)
Glucose-Capillary: 104 mg/dL — ABNORMAL HIGH (ref 70–99)
Glucose-Capillary: 123 mg/dL — ABNORMAL HIGH (ref 70–99)
Glucose-Capillary: 154 mg/dL — ABNORMAL HIGH (ref 70–99)
Glucose-Capillary: 155 mg/dL — ABNORMAL HIGH (ref 70–99)
Glucose-Capillary: 171 mg/dL — ABNORMAL HIGH (ref 70–99)

## 2019-10-09 MED ORDER — CEPHALEXIN 500 MG PO CAPS
500.0000 mg | ORAL_CAPSULE | Freq: Three times a day (TID) | ORAL | Status: DC
  Administered 2019-10-09 – 2019-10-10 (×3): 500 mg via ORAL
  Filled 2019-10-09 (×3): qty 1

## 2019-10-09 MED ORDER — CYCLOBENZAPRINE HCL 10 MG PO TABS
5.0000 mg | ORAL_TABLET | Freq: Three times a day (TID) | ORAL | Status: DC | PRN
  Administered 2019-10-09 – 2019-10-10 (×2): 5 mg via ORAL
  Filled 2019-10-09 (×2): qty 1

## 2019-10-09 MED ORDER — FLORANEX PO PACK
1.0000 g | PACK | Freq: Three times a day (TID) | ORAL | Status: DC
  Administered 2019-10-10 (×2): 1 g via ORAL
  Filled 2019-10-09 (×5): qty 1

## 2019-10-09 MED ORDER — PNEUMOCOCCAL VAC POLYVALENT 25 MCG/0.5ML IJ INJ
0.5000 mL | INJECTION | INTRAMUSCULAR | Status: AC
  Administered 2019-10-10: 0.5 mL via INTRAMUSCULAR
  Filled 2019-10-09: qty 0.5

## 2019-10-09 MED ORDER — BACID PO TABS: 2.0000 | ORAL_TABLET | Freq: Three times a day (TID) | ORAL | Status: DC

## 2019-10-09 MED ORDER — INFLUENZA VAC A&B SA ADJ QUAD 0.5 ML IM PRSY
0.5000 mL | PREFILLED_SYRINGE | INTRAMUSCULAR | Status: AC
  Administered 2019-10-10: 0.5 mL via INTRAMUSCULAR
  Filled 2019-10-09: qty 0.5

## 2019-10-09 MED ORDER — FUROSEMIDE 10 MG/ML IJ SOLN: 20.0000 mg | Freq: Every day | INTRAMUSCULAR | Status: DC

## 2019-10-09 NOTE — ED Notes (Signed)
Called pharm about lyrica as C-pod and main ED pyxis do not have enough lyrica to meet pt's scheduled dose. State they will bring some up soon.

## 2019-10-09 NOTE — ED Notes (Signed)
cbg 155 

## 2019-10-09 NOTE — ED Notes (Signed)
Provider Marylu Lund S notified via secure chat of pt's continued hypotension and that 0900 dose of midodrine not given since it wasn't available. Notified that most recent dose given at 1340 and next due at 1800. Notified pt not particularly symptomatic from the hypotension; states feels his baseline. Awaiting orders.

## 2019-10-09 NOTE — ED Notes (Signed)
Report to mary, rn.  

## 2019-10-09 NOTE — ED Notes (Signed)
Pt O2 sat dropped to 86%; pt had removed his cannula to blow his nose and forgot to put it back on. Pt laced back on [place] at 3 L.

## 2019-10-09 NOTE — ED Notes (Addendum)
Verbal to hold lasix and that it will be d/c. O2 inc to 4L from home 3L d/t desat to 88%.

## 2019-10-09 NOTE — ED Notes (Signed)
Pt rang call bell to request pain medication; prn oxycodone order 3x daily, advised pt will get his morning meds together and bring in shortly.

## 2019-10-09 NOTE — Progress Notes (Signed)
Patient admitted to unit. Oriented to room, call bell, and staff. Bed in lowest position. Fall safety plan reviewed. Full assessment to Epic. Skin assessment verified with Dellia Nims. Telemetry box verification with tele clerk- Box#: [id number] Will continue to monitor.

## 2019-10-09 NOTE — ED Notes (Signed)
Pt denies nausea and dizziness. Given a drink as requested.

## 2019-10-09 NOTE — ED Notes (Addendum)
Glasses placed on bedside table with cellphone, pt's wheelchair in room with pt. Urinal x2 at bedside. Pt with 3+ pedal edema noted. Head of bed elevated to 30 degrees.

## 2019-10-09 NOTE — TOC Initial Note (Addendum)
Transition of Care [facility]) - Initial/Assessment Note    Patient Details  Name: Gregory Hall MRN: [id number] Date of Birth: May 15, 1955  Transition of Care [facility]) CM/SW Contact:    Marina Goodell Phone Number: [phone] 10/09/2019, 1:20 PM  Clinical Narrative:                  Patient presents to [facility] ED due to SOB abnormal EKG.  Patient lives at [facility], [address] [place] [phone].  Patient's main contact is his daughter [name] Murrain [phone].  Patient is uses a wheelchair at all times and needs assistance with all ADLs.  Patient uses Medicaid transport for doctor's appointments.  CSW explained TOC role in patient care and process and timeline. Patient verbalized understanding.  Patient stated he needed assistance with getting a new wheelchair, his wheelchair is 66 years old.  CSW spoke with Attending and she stated she will include cardiology consult but did not think PT or OT consult was necessary at this time.  Expected Discharge Plan: Skilled Nursing Facility     Patient Goals and CMS Choice Patient states their goals for this hospitalization and ongoing recovery are:: "I need a new wheelchair, mine's 66 years old. CMS Medicare.gov Compare Post Acute Care list provided to:: Patient Choice offered to / list presented to : Patient  Expected Discharge Plan and Services Expected Discharge Plan: Skilled Nursing Facility In-house Referral: Clinical Social Work     Living arrangements for the past 2 months: Group Home                                      Prior Living Arrangements/Services Living arrangements for the past 2 months: Group Home Lives with:: Facility Resident Patient language and need for interpreter reviewed:: Yes Do you feel safe going back to the place where you live?: Yes      Need for Family Participation in Patient Care: No (Comment) Care giver support system in place?: Yes (comment)    Criminal Activity/Legal Involvement Pertinent to Current Situation/Hospitalization: No - Comment as needed  Activities of Daily Living      Permission Sought/Granted Permission sought to share information with : Facility Medical sales representative, Family Supports    Share Information with NAME: Charletta Cousin (Sister) [phone]  Permission granted to share info w AGENCY: [facility] [phone]        Emotional Assessment Appearance:: Appears older than stated age Attitude/Demeanor/Rapport: Lethargic Affect (typically observed): Withdrawn Orientation: : Oriented to Self, Oriented to Place, Oriented to  Time, Oriented to Situation Alcohol / Substance Use: Not Applicable Psych Involvement: No (comment)  Admission diagnosis:  Acute CHF (congestive heart failure) (HCC) [I50.9] Patient Active Problem List   Diagnosis Date Noted  . Acute CHF (congestive heart failure) (HCC) 10/08/2019  . Chronic respiratory failure with hypoxia (HCC) 10/08/2019  . Atrial flutter (HCC)   . Acute on chronic respiratory failure with hypoxia (HCC) 09/13/2019  . Acute diastolic CHF (congestive heart failure) (HCC) 09/12/2019  . NSTEMI (non-ST elevated myocardial infarction) (HCC) 09/12/2019  . Acute kidney failure, unspecified (HCC) 09/12/2019  . Diabetes mellitus type 2, uncontrolled, with complications (HCC) 06/10/2019  . Chronic pain syndrome 06/10/2019  . COPD exacerbation (HCC) 06/10/2019  . COPD with acute exacerbation (HCC) 06/09/2019  . CAD (coronary artery disease) 06/09/2019  . HTN (hypertension) 06/09/2019  . Acute on chronic  respiratory failure with hypoxia and hypercapnia (HCC) 06/09/2019  . Anxiety 06/09/2019  . Chronic prescription opiate use 06/09/2019  . Hyperglycemia 06/09/2019  . Subclavian artery stenosis, left (HCC) 08/20/2014  . Rectus diastasis 07/19/2012  . Hernia   . Edema 05/10/2011  . Goals of care, counseling/discussion 10/06/2010  . SMOKER 11/25/2009  . CAROTID BRUIT,  RIGHT 11/24/2009  . Chest pain 11/24/2009  . Hyperlipidemia 03/30/2009  . Coronary atherosclerosis 03/30/2009  . HYPERTENSION, HX OF 03/30/2009   PCP:  Enid Baas, MD Pharmacy:   [facility] - [place], [place] - [address] [place] [address] Phone: [phone] Fax: [phone]     Social Determinants of Health (SDOH) Interventions    Readmission Risk Interventions Readmission Risk Prevention Plan 09/24/2019 09/14/2019  Transportation Screening Complete Complete  PCP or Specialist Appt within 3-5 Days - Complete  HRI or Home Care Consult - Complete  Social Work Consult for Recovery Care Planning/Counseling - Complete  Palliative Care Screening - Not Applicable  Medication Review Oceanographer) Complete Complete  PCP or Specialist appointment within 3-5 days of discharge Complete -  HRI or Home Care Consult Complete -  Palliative Care Screening Complete -  Skilled Nursing Facility Complete -  Some recent data might be hidden

## 2019-10-09 NOTE — ED Notes (Signed)
Pt repositioned in bed again for comfort.  

## 2019-10-09 NOTE — ED Notes (Signed)
[name] with group home called for update.

## 2019-10-09 NOTE — Progress Notes (Signed)
PROGRESS NOTE    Gregory Hall  XBJ:[id number] DOB: 1955/06/25 DOA: 10/08/2019 PCP: Enid Baas, MD    Brief Narrative:  Gregory Hall a 64 y.o.malewith history ofCAD s/p PCI to the LAD in 2010,HFpEF, atrial flutter s/p DCCV 09/26/2019,PAD with prior stenting to the left subclavian artery,CKD stage III, IDDM,HTN, HLD, psychiatric history,chronic hypoxic respiratory failure on supplemental home oxygen via nasal cannula at 3 L secondary toCOPD secondary to tobacco use, esophageal spasm, erosive gastritis, GERD,anxiety/depression,and chronic back pain  Went to [facility] pcp today and tells me he was told to go to the ER due to his bilateral lower extremity edema.  Patient was seen by his cardiologist on 10/06/2019 for hospital follow-up after recent admission to [facility] from  9/12 to 9/19 formetabolic encephalopathy secondary to acute on chronic hypoxic and hypercapneic respiratory failure requiring mechanical ventilation secondary to AECOPD and acute on chronic HFpEF, hypotension requiring vasopressor support, ARF requiring temporary dialysis, and new onset atrial flutter with RVR s/p DCCV.  He reports chronically he is on 3 L nasal cannula at home.  He lives in assisted living and reports compliance with his medications.  On 6 pillows due to his back pain and also shortness of breath.  This is chronic for him.  He is wheelchair-bound mostly and unable to tell me if he has dyspnea on exertion.  He does report mildly more short of breath than his baseline.  Denies any chest pain, palpitations, dizziness or lightheadedness.                                                                                                         His recent echocardiogram revealed normal EF.  Grade 2 diastolic dysfunction.  Consultants:    Procedures:   Antimicrobials:      Subjective: C/o all over body pain which is chronic. Unable to say if sob improved. Still with LE edema.  Objective: Vitals:   10/09/19 1709 10/09/19 1710 10/09/19 1715 10/09/19 1744  BP:  (!) 89/56 (!) 89/74 (!) 105/56  Pulse:  69 68 67  Resp:    18  Temp:    (!) 97.5 F (36.4 C)  TempSrc:    Oral  SpO2: 94%   96%  Weight:    91.9 kg  Height:    6' (1.829 m)    Intake/Output Summary (Last 24 hours) at 10/09/2019 1910 Last data filed at 10/09/2019 1138 Gross per 24 hour  Intake --  Output 1300 ml  Net -1300 ml   Filed Weights   10/08/19 1430 10/09/19 1744  Weight: 94.1 kg 91.9 kg    Examination:  General exam: Appears calm and comfortable  Respiratory system: scattered rales b/l , no wheezing Cardiovascular system: S1 & S2 heard, RRR. No JVD, murmurs, rubs, gallops or clicks.  Gastrointestinal system: Abdomen is nondistended, soft and nontender.. Normal bowel sounds heard. Central nervous system: Alert and oriented.groslly intact Extremities: 2-3+ pitting edema, RT LE >L rubor, warm to touch. , LE edema mildly improved. Skin: ecchymosis, warm, dry Psychiatry: Judgement and insight appear normal. Mood & affect appropriate.     Data Reviewed: I have personally reviewed following labs and imaging studies  CBC: Recent Labs  Lab 10/06/19 0829 10/08/19 1530  WBC 8.0 6.8  NEUTROABS 4.4  --   HGB 11.3* 10.9*  HCT 36.2* 36.3*  MCV 88 92.6  PLT 470* 362   Basic Metabolic Panel: Recent Labs  Lab 10/06/19 0829 10/08/19 1530 10/09/19 0338  NA 143 141 143  K 5.0 4.3 3.6  CL 95* 96* 95*  CO2 32* 33* 35*  GLUCOSE 99 65* 104*  BUN 23 21 16   CREATININE 1.99* 1.30* 1.12  CALCIUM 8.7 8.7* 8.3*   GFR: Estimated Creatinine Clearance: 71.2 mL/min (by C-G formula based on SCr of 1.12  mg/dL). Liver Function Tests: Recent Labs  Lab 10/06/19 0829 10/08/19 1530  AST 19 16  ALT 15 13  ALKPHOS 98 84  BILITOT 0.2 0.6  PROT 6.2 6.7  ALBUMIN 3.9 3.7   No results for input(s): LIPASE, AMYLASE in the last 168 hours. No results for input(s): AMMONIA in the last 168 hours. Coagulation Profile: No results for input(s): INR, PROTIME in the last 168 hours. Cardiac Enzymes: Recent Labs  Lab 10/08/19 1530  CKTOTAL 81   BNP (last 3 results) No results for input(s): PROBNP in the last 8760 hours. HbA1C: No results for input(s): HGBA1C in the last 72 hours. CBG: Recent Labs  Lab 10/09/19 0031 10/09/19 0334 10/09/19 0743 10/09/19 1228 10/09/19 1634  GLUCAP 104* 100* 123* 155* 171*   Lipid Profile: No results for input(s): CHOL, HDL, LDLCALC, TRIG, CHOLHDL, LDLDIRECT in the last 72 hours. Thyroid Function Tests: No results for input(s): TSH, T4TOTAL, FREET4, T3FREE, THYROIDAB in the last 72 hours. Anemia Panel: No results for input(s): VITAMINB12, FOLATE, FERRITIN, TIBC, IRON, RETICCTPCT in the last 72 hours. Sepsis Labs: Recent Labs  Lab 10/08/19 1530  PROCALCITON <0.10    Recent Results (from the past 240 hour(s))  Respiratory Panel by RT PCR (Flu A&B, Covid) - Nasopharyngeal Swab     Status: None   Collection Time: 10/08/19  3:35 PM   Specimen: Nasopharyngeal Swab  Result Value Ref Range  Status   SARS Coronavirus 2 by RT PCR NEGATIVE NEGATIVE Final    Comment: (NOTE) SARS-CoV-2 target nucleic acids are NOT DETECTED.  The SARS-CoV-2 RNA is generally detectable in upper respiratoy specimens during the acute phase of infection. The lowest concentration of SARS-CoV-2 viral copies this assay can detect is 131 copies/mL. A negative result does not preclude SARS-Cov-2 infection and should not be used as the sole basis for treatment or other patient management decisions. A negative result may occur with  improper specimen collection/handling, submission of  specimen other than nasopharyngeal swab, presence of viral mutation(s) within the areas targeted by this assay, and inadequate number of viral copies (<131 copies/mL). A negative result must be combined with clinical observations, patient history, and epidemiological information. The expected result is Negative.  Fact Sheet for Patients:  https://www.moore.com/  Fact Sheet for Healthcare Providers:  https://www.young.biz/  This test is no t yet approved or cleared by the [place] FDA and  has been authorized for detection and/or diagnosis of SARS-CoV-2 by FDA under an Emergency Use Authorization (EUA). This EUA will remain  in effect (meaning this test can be used) for the duration of the COVID-19 declaration under Section 564(b)(1) of the Act, 21 U.S.C. section 360bbb-3(b)(1), unless the authorization is terminated or revoked sooner.     Influenza A by PCR NEGATIVE NEGATIVE Final   Influenza B by PCR NEGATIVE NEGATIVE Final    Comment: (NOTE) The Xpert Xpress SARS-CoV-2/FLU/RSV assay is intended as an aid in  the diagnosis of influenza from Nasopharyngeal swab specimens and  should not be used as a sole basis for treatment. Nasal washings and  aspirates are unacceptable for Xpert Xpress SARS-CoV-2/FLU/RSV  testing.  Fact Sheet for Patients: https://www.moore.com/  Fact Sheet for Healthcare Providers: https://www.young.biz/  This test is not yet approved or cleared by the [place] FDA and  has been authorized for detection and/or diagnosis of SARS-CoV-2 by  FDA under an Emergency Use Authorization (EUA). This EUA will remain  in effect (meaning this test can be used) for the duration of the  Covid-19 declaration under Section 564(b)(1) of the Act, 21  U.S.C. section 360bbb-3(b)(1), unless the authorization is  terminated or revoked. Performed at [facility], [address] [place], [place] [address]          Radiology Studies: US Venous Img Lower Bilateral (DVT)  Result Date: 10/08/2019 CLINICAL DATA:  Bilateral lower extremity swelling for 3 weeks. EXAM: BILATERAL LOWER EXTREMITY VENOUS DOPPLER ULTRASOUND TECHNIQUE: Gray-scale sonography with compression, as well as color and duplex ultrasound, were performed to evaluate the deep venous system(s) from the level of the common femoral vein through the popliteal and proximal calf veins. COMPARISON:  07/14/2011 bilateral lower extremity venous Doppler scan. FINDINGS: VENOUS Normal compressibility of the common femoral, superficial femoral, and popliteal veins, as well as the visualized calf veins. Visualized portions of profunda femoral vein and great saphenous vein unremarkable. No filling defects to suggest DVT on grayscale or color Doppler imaging. Doppler waveforms show normal direction of venous flow, normal respiratory plasticity and response to augmentation. Limited views of the contralateral common femoral vein are unremarkable. OTHER None. Limitations: none IMPRESSION: No evidence of deep venous thrombosis in either lower extremity. Electronically Signed   By: Delbert Phenix M.D.   On: 10/08/2019 19:34   DG Chest Portable 1 View  Result Date: 10/08/2019 CLINICAL DATA:  Shortness of breath.  History of CHF and COPD. EXAM: PORTABLE CHEST 1 VIEW COMPARISON:  Multiple priors, most recent 09/25/2019 FINDINGS: Patchy opacities in bilateral lung bases, new from prior. No visible pleural effusions. No pneumothorax. Similar cardiac silhouette. Aortic atherosclerosis. Left subclavian stent in similar position. Similar positioning of thoracic spinal stimulator leads in the midthoracic region. IMPRESSION: New bibasilar opacities, which may represent atelectasis, aspiration, and/or pneumonia. Electronically Signed   By: Feliberto Harts MD   On: 10/08/2019 15:57        Scheduled Meds: . Acetylcysteine  600 mg Oral Daily   . amiodarone  200 mg Oral Daily  . aspirin EC  81 mg Oral Daily  . atorvastatin  40 mg Oral QHS  . bisoprolol  2.5 mg Oral Daily  . cholecalciferol  2,000 Units Oral Daily  . DULoxetine  60 mg Oral Daily  . [START ON 10/10/2019] furosemide  20 mg Intravenous Daily  . [START ON 10/10/2019] influenza vaccine adjuvanted  0.5 mL Intramuscular Tomorrow-1000  . insulin aspart  0-15 Units Subcutaneous Q4H  . lamoTRIgine  100 mg Oral Daily  . midodrine  10 mg Oral TID PC  . mometasone-formoterol  2 puff Inhalation BID   And  . umeclidinium bromide  1 puff Inhalation Daily  . nicotine  21 mg Transdermal Daily  . oxyCODONE  10 mg Oral TID  . pantoprazole  40 mg Oral Daily  . [START ON 10/10/2019] pneumococcal 23 valent vaccine  0.5 mL Intramuscular Tomorrow-1000  . pregabalin  200 mg Oral TID  . Ensure Max Protein  11 oz Oral BID BM  . rivaroxaban  20 mg Oral Q supper  . roflumilast  250 mcg Oral Daily  . sodium chloride flush  3 mL Intravenous Q12H  . tamsulosin  0.4 mg Oral QPC supper  . traZODone  100 mg Oral QHS   Continuous Infusions: . sodium chloride      Assessment & Plan:   Active Problems:   Hyperlipidemia   SMOKER   CAD (coronary artery disease)   Chronic pain syndrome   Acute diastolic CHF (congestive heart failure) (HCC)   Acute kidney failure, unspecified (HCC)   Acute CHF (congestive heart failure) (HCC)   Chronic respiratory failure with hypoxia (HCC)  1. Acute on chronic diastolic HF-  Still volume overloaded. bnp elevated from baseline on admission Will continue with iv lasix , decrease dose from 40 to 20mg  due to soft bp. I/o Daily weight  2. Possible RT LE cellulitis- pt with rubor, warmth. Although no leukocytosis.  Will start keflex , keep legs elevated Venous US b/l negative.   2.recent h/xo aflutter -s/p DCCV 09/26/2019. Remains in SB Continue xrelto, beta blk, and amidarone     3.Smoker-  Continue nicotine patch   4. COPD-currently no  exacerbation Continue inhalers.  5.CAD- no cp.  Negative troponin.  Continue asa, statin, and bisoprolol  6.PNA- based on cxr. PNA ruled out. No leukocytosis. Afebrile.  Will d/c cef and azithro as pt does not clinically have pna.  7.IDDM-A1c improved, at 7.0  hold home dose insulin. BG stable.  Place on R ISS, hypoglycemic protocol Accu-Cheks   8.chronic back pain- on chronic pain med.  Continue home pain med Resume flexeril home dose prn  9.Chronic respiratory failure with hypoxia- on chronic 02 3L. On 3-4 L o2 here, will wean down to baseline with diuresis. .  10.AKI- possibly prerenal from chf.  Improved with diuresis.  creatinine 1/12 today. Continue to monitor.      DVT prophylaxis: xarelto Code Status:full Family Communication: none at bedside  Status is: Inpatient  Remains inpatient appropriate because:IV treatments appropriate due to intensity of illness or inability to take PO   Dispo: The patient is from: Home              Anticipated d/c is to: Home              Anticipated d/c date is: 3 days              Patient currently is not medically stable to d/c.            LOS: 1 day   Time spent: 45 min with >50% on coc    Lynn Ito, MD [facility] Pager [phone]  If 7PM-7AM, please contact night-coverage [email or web address] Password [email or web address] 10/09/2019, 7:10 PM

## 2019-10-09 NOTE — ED Notes (Signed)
Dinner trays to ER.

## 2019-10-09 NOTE — ED Notes (Signed)
Pt c/o nausea. Agrees to try zofran.

## 2019-10-09 NOTE — ED Notes (Signed)
Pt will not leave blood pressure cuff or pox in place, mulitple attempts have been made by this rn to ensure compliance.

## 2019-10-09 NOTE — Progress Notes (Signed)
   Heart Failure Nurse Navigator Note  HFpEF- 55-60%  by echocardiogram performed . Indeterminate diastolic filling due to E-A fusion.  Mild aortic sclerosis.   He presented with worsening SOB and weakness.  States his weight gain was not gradual, overnight.  Comorbidties:  Coronary artery disease COPD Hypertension Hyperlipidemia Atrial Flutter  Labs:  Sodium 141,potassium 4.3, BUN 21, creatinine 1.30.  BNP 538, albumin 3.7, troponin 7.0   Medications:  Aspirin 81 mg daily Lipitor 40 mg daily Bisoprolol 2.5 mg daily Midodrine 10 mg every 8 hours Lasix 40 mg daily  (amiodarone 200 mg daily discontinued at recent office visit due to tremor.   Assessment:  General- he is lying on a gurney in the ED, he looks very uncomfortable, states that his neck and back are hurting.  HEENT- normocephalic  Neck- No JVD  Cardiac-heart tones are regular  Chest- breath sounds with expiratory wheeze, bilaterally.  Abdomen- soft, rounded  Musculoskeletal-  Moves all four extremities, edema present, pitting and skin is shiny.  Pedal pulses palpable.  Skin- warm and dry.  Neuro-speech is clear,   Psych-is pleasant and appropriate, makes eye contact.   Discussed with patient, had he gone over fluid restriction(8-8 oz cups of liquid) or dietary indiscretion, he did not feel that he had, states he only drinks 18 oz daily.  He does weigh himself daily and does not feel he ate anything he should not have.  He was given heart failure zones magnet, discussed each area.    Will continue to follow.   Tresa Endo RN, CHFN

## 2019-10-09 NOTE — ED Notes (Signed)
Pt awake, inquiring what time. Ice chips provided.

## 2019-10-10 LAB — BASIC METABOLIC PANEL
Anion gap: 7 (ref 5–15)
BUN: 12 mg/dL (ref 8–23)
CO2: 39 mmol/L — ABNORMAL HIGH (ref 22–32)
Calcium: 8.8 mg/dL — ABNORMAL LOW (ref 8.9–10.3)
Chloride: 93 mmol/L — ABNORMAL LOW (ref 98–111)
Creatinine, Ser: 1.13 mg/dL (ref 0.61–1.24)
GFR calc Af Amer: >60 mL/min (ref >60)
GFR calc non Af Amer: >60 mL/min (ref >60)
Glucose, Bld: 153 mg/dL — ABNORMAL HIGH (ref 70–99)
Potassium: 3.4 mmol/L — ABNORMAL LOW (ref 3.5–5.1)
Sodium: 139 mmol/L (ref 135–145)

## 2019-10-10 LAB — GLUCOSE, CAPILLARY
Glucose-Capillary: 130 mg/dL — ABNORMAL HIGH (ref 70–99)
Glucose-Capillary: 155 mg/dL — ABNORMAL HIGH (ref 70–99)

## 2019-10-10 LAB — BRAIN NATRIURETIC PEPTIDE: B Natriuretic Peptide: 276 pg/mL — ABNORMAL HIGH (ref 0.0–100.0)

## 2019-10-10 MED ORDER — ACETAZOLAMIDE 250 MG PO TABS
500.0000 mg | ORAL_TABLET | Freq: Two times a day (BID) | ORAL | Status: DC
  Administered 2019-10-10: 500 mg via ORAL
  Filled 2019-10-10 (×2): qty 2

## 2019-10-10 MED ORDER — ACETAZOLAMIDE 250 MG PO TABS
250.0000 mg | ORAL_TABLET | Freq: Two times a day (BID) | ORAL | Status: DC
  Filled 2019-10-10: qty 1

## 2019-10-10 MED ORDER — POTASSIUM CHLORIDE CRYS ER 20 MEQ PO TBCR
40.0000 meq | EXTENDED_RELEASE_TABLET | Freq: Once | ORAL | Status: AC
  Administered 2019-10-10: 40 meq via ORAL
  Filled 2019-10-10: qty 2

## 2019-10-10 MED ORDER — CEPHALEXIN 500 MG PO CAPS: 500.0000 mg | ORAL_CAPSULE | Freq: Three times a day (TID) | ORAL | 0 refills | Status: AC

## 2019-10-10 MED ORDER — TAMSULOSIN HCL 0.4 MG PO CAPS: 0.4000 mg | ORAL_CAPSULE | Freq: Every day | ORAL | Status: DC

## 2019-10-10 NOTE — Discharge Summary (Signed)
Gregory Hall [facility] WUJ:[id number] DOB: 06/05/1955 DOA: 10/08/2019  PCP: Enid Baas, MD  Admit date: 10/08/2019 Discharge date: 10/10/2019  Admitted From: SNF Disposition: SNF  Recommendations for Outpatient Follow-up:  1. Follow up with PCP in 1 week 2. Please obtain BMP/CBC in one week 3. Cardiology next week     Discharge Condition:Stable CODE STATUS: Full Diet recommendation: Heart Healthy Brief/Interim Summary: Gregory Hall a 64 y.o.malewith history ofCAD s/p PCI to the LAD in 2010,HFpEF, atrial flutter s/p DCCV 09/26/2019,PAD with prior stenting to the left subclavian artery,CKD stage III, IDDM,HTN, HLD, psychiatric history,chronic hypoxic respiratory failure on supplemental home oxygen via nasal cannula at 3 L secondary toCOPD secondary to tobacco use, esophageal spasm, erosive gastritis, GERD,anxiety/depression,and chronic back pain  Went to [facility] on the day of admission and was told  to go to the ER due to his bilateral lower extremity edema.  Patient was seen by his cardiologist on 10/06/2019 for hospital follow-up after recent admission to [facility] from  9/12 to 9/19 formetabolic encephalopathy secondary to acute on chronic hypoxic and hypercapneic respiratory failure requiring mechanical ventilation secondary to AECOPD and acute on chronic HFpEF, hypotension requiring vasopressor support, ARF requiring temporary dialysis, and new onset atrial flutter with RVR s/p DCCV. He was admitted for acute diastolic heart failure. He reported chronically he is on 3 L nasal cannula at home.  He lives in assisted living and reports compliance with his medications.  On 6 pillows due to his back pain and also shortness of breath.  This is chronic for him.  He is wheelchair-bound mostly and unable to tell me if he has dyspnea on exertion. Marland Kitchen His recent echocardiogram revealed normal EF.  Grade 2 diastolic dysfunction.              1. Acute on chronic diastolic HF-   clinically has improved and more euvolemic.  He can go back to his Lasix daily dose starting tomorrow.  2. Possible RT LE cellulitis- pt with rubor, warmth. Although no leukocytosis.   Venous US b/l negative. Started on Keflex will need to complete 4 more days.    2.recent h/xo aflutter -s/pDCCV 09/26/2019. Remains in SB Continue xrelto, beta blk, and amidarone   3.Smoker-  Continue nicotine patch   4. COPD-currently no exacerbation Continue inhalers. Need to discuss with pcp about when to stop prednisone ?  5.CAD- no cp.  Negative troponin.  Continue asa, statin, and bisoprolol At times while receiving iv lasix bp on low side and beta blk held. Pt does tell me usually spb in 90's.  6.PNA- based on cxr. PNA ruled out. No leukocytosis. Afebrile.  discontinued cef and azithro as pt does not clinically have pna.  7.IDDM-A1c 7.0 Continue home meds  8.chronic back pain- on chronic pain med.  Continue home regimen on discharge.   9.Chronic respiratory failure with hypoxia- on chronic 02 3L. At baseline now.  10.AKI- possibly prerenal from chf.  Improved with diuresis.    11.hypokalemia- k 3.4, was replaced. Need bmp in 2-3 days   Discharge Diagnoses:  Active Problems:   Hyperlipidemia   SMOKER   CAD (coronary artery disease)   Chronic pain syndrome   Acute diastolic CHF (congestive heart failure) (HCC)   Acute kidney failure, unspecified (HCC)   Acute CHF (congestive heart failure) (HCC)   Chronic respiratory failure with hypoxia [facility])    Discharge Instructions  Discharge Instructions    Amb Referral to HF Clinic   Complete by: As directed  Diet - low sodium heart healthy   Complete by: As directed    Discharge instructions   Complete by: As directed    Talk to your doctor about stopping prednisone?  F/u with cardiology next week, f/u with pcp in one week   Increase activity slowly   Complete by: As directed      Allergies as of  10/10/2019      Reactions   Acetaminophen Other (See Comments)   Reaction:  Unknown  Kidney failure Told not to take from home M.D. Related to kidney and renal failure   Fentanyl    Nsaids Other (See Comments)   Reaction:  Unknown  Kidney failure Patient states not to take from home M.D. Related to kidney and renal failure. Other reaction(s): Unknown      Medication List    STOP taking these medications   baclofen 20 MG tablet Commonly known as: LIORESAL   guaiFENesin 600 MG 12 hr tablet Commonly known as: MUCINEX   loperamide 2 MG capsule Commonly known as: IMODIUM   metoprolol succinate 25 MG 24 hr tablet Commonly known as: TOPROL-XL   ondansetron 4 MG tablet Commonly known as: ZOFRAN     TAKE these medications   amiodarone 200 MG tablet Commonly known as: PACERONE Take 200 mg by mouth daily.   aspirin EC 81 MG tablet Take 81 mg by mouth daily.   atorvastatin 40 MG tablet Commonly known as: LIPITOR Take 1 tablet (40 mg total) by mouth daily.   bisoprolol 5 MG tablet Commonly known as: ZEBETA Take 0.5 tablets (2.5 mg total) by mouth daily.   cephALEXin 500 MG capsule Commonly known as: KEFLEX Take 1 capsule (500 mg total) by mouth every 8 (eight) hours for 4 days.   cyclobenzaprine 5 MG tablet Commonly known as: FLEXERIL Take 1 tablet (5 mg total) by mouth 3 (three) times daily as needed. What changed:   when to take this  reasons to take this   DULoxetine 60 MG capsule Commonly known as: CYMBALTA Take 60 mg by mouth daily.   Ensure Max Protein Liqd Take 330 mLs (11 oz total) by mouth 2 (two) times daily between meals.   furosemide 20 MG tablet Commonly known as: LASIX Take 1 tablet (20 mg total) by mouth daily. What changed:   when to take this  reasons to take this   insulin glargine 100 UNIT/ML injection Commonly known as: LANTUS Inject 22 Units into the skin at bedtime.   insulin lispro 100 UNIT/ML injection Commonly known as:  HUMALOG Inject 4 Units into the skin 3 (three) times daily before meals. Hold for FSBG <100   ipratropium-albuterol 0.5-2.5 (3) MG/3ML Soln Commonly known as: DUONEB Take 3 mLs by nebulization every 6 (six) hours as needed. What changed: reasons to take this   lactulose 10 GM/15ML solution Commonly known as: CHRONULAC Take 20 g by mouth daily as needed for mild constipation.   lamoTRIgine 100 MG tablet Commonly known as: LAMICTAL Take 100 mg by mouth daily.   metFORMIN 750 MG 24 hr tablet Commonly known as: GLUCOPHAGE-XR Take 1 tablet (750 mg total) by mouth 2 (two) times daily.   midodrine 10 MG tablet Commonly known as: PROAMATINE Take 1 tablet (10 mg total) by mouth every 8 (eight) hours.   NAC 600 MG Caps Generic drug: Acetylcysteine Take 600 mg by mouth daily.   nicotine 21 mg/24hr patch Commonly known as: NICODERM CQ - dosed in mg/24 hours Place onto the skin.  Nitrostat 0.4 MG SL tablet Generic drug: nitroGLYCERIN DISSOLVE (1) TABLET UNDER TONGUE AS NEEDED TO RELIEVE CHEST PAIN. MAYREPEAT EVERY 5 MINUTES. What changed: See the new instructions.   Oxycodone HCl 10 MG Tabs Take 1 tablet (10 mg total) by mouth 3 (three) times daily.   oxymetazoline 0.05 % nasal spray Commonly known as: AFRIN Place 2 sprays into both nostrils 2 (two) times daily as needed for congestion.   pantoprazole 40 MG tablet Commonly known as: PROTONIX Take 40 mg by mouth daily.   predniSONE 5 MG tablet Commonly known as: DELTASONE Take 2.5 mg by mouth daily with breakfast.   pregabalin 200 MG capsule Commonly known as: LYRICA Take 1 capsule (200 mg total) by mouth 3 (three) times daily.   rivaroxaban 20 MG Tabs tablet Commonly known as: XARELTO Take 20 mg by mouth daily with supper.   SALONPAS PAIN RELIEF PATCH EX Place 1 patch onto the skin every 12 (twelve) hours as needed (aches and pains).   senna-docusate 8.6-50 MG tablet Commonly known as: Senokot-S Take 1 tablet by  mouth daily.   tamsulosin 0.4 MG Caps capsule Commonly known as: FLOMAX Take 1 capsule (0.4 mg total) by mouth daily after supper. What changed:   how much to take  when to take this   tiotropium 18 MCG inhalation capsule Commonly known as: SPIRIVA Place into inhaler and inhale.   traZODone 100 MG tablet Commonly known as: DESYREL Take 100 mg by mouth at bedtime.   Vitamin D3 50 MCG (2000 UT) Tabs Take 2,000 Units by mouth daily.       Follow-up Information    [facility] Follow up on 10/21/2019.   Specialty: Cardiology Why: at 11:30am. Enter through the Medical Mall entrance Contact information: [address] [place] [address] [phone]       Enid Baas, MD On 10/20/2019.   Specialty: Internal Medicine Why: @ 11:30am Contact information: [address] [place] [address] [phone]        Iran Ouch, MD On 10/17/2019.   Specialty: Cardiology Why:  @ 9am Contact information: [address] [place] [address] [phone]              Allergies  Allergen Reactions  . Acetaminophen Other (See Comments)    Reaction:  Unknown  Kidney failure Told not to take from home M.D. Related to kidney and renal failure   . Fentanyl   . Nsaids Other (See Comments)    Reaction:  Unknown  Kidney failure Patient states not to take from home M.D. Related to kidney and renal failure.  Other reaction(s): Unknown    Consultations:     Procedures/Studies: US RENAL  Result Date: 09/22/2019 CLINICAL DATA:  Acute renal failure EXAM: RENAL / URINARY TRACT ULTRASOUND COMPLETE COMPARISON:  None. FINDINGS: Right Kidney: Renal measurements: 10.9 x 5.7 x 5.0 cm = volume: 163 mL. Echogenic renal parenchyma. No mass or hydronephrosis visualized. Left Kidney: Renal measurements: 11.0 x 5.3 x 6.0 cm = volume: 180 mL. Echogenic renal parenchyma. No  mass or hydronephrosis visualized. Bladder: Decompressed by indwelling Foley catheter Other: None. IMPRESSION: Echogenic renal parenchyma, suggesting medical renal disease. No hydronephrosis. Bladder decompressed by indwelling Foley catheter. Electronically Signed   By: Charline Bills M.D.   On: 09/22/2019 09:36   US Venous Img Lower Bilateral (DVT)  Result Date: 10/08/2019 CLINICAL DATA:  Bilateral lower extremity swelling for 3 weeks.  EXAM: BILATERAL LOWER EXTREMITY VENOUS DOPPLER ULTRASOUND TECHNIQUE: Gray-scale sonography with compression, as well as color and duplex ultrasound, were performed to evaluate the deep venous system(s) from the level of the common femoral vein through the popliteal and proximal calf veins. COMPARISON:  07/14/2011 bilateral lower extremity venous Doppler scan. FINDINGS: VENOUS Normal compressibility of the common femoral, superficial femoral, and popliteal veins, as well as the visualized calf veins. Visualized portions of profunda femoral vein and great saphenous vein unremarkable. No filling defects to suggest DVT on grayscale or color Doppler imaging. Doppler waveforms show normal direction of venous flow, normal respiratory plasticity and response to augmentation. Limited views of the contralateral common femoral vein are unremarkable. OTHER None. Limitations: none IMPRESSION: No evidence of deep venous thrombosis in either lower extremity. Electronically Signed   By: Delbert Phenix M.D.   On: 10/08/2019 19:34   DG Chest Portable 1 View  Result Date: 10/08/2019 CLINICAL DATA:  Shortness of breath.  History of CHF and COPD. EXAM: PORTABLE CHEST 1 VIEW COMPARISON:  Multiple priors, most recent 09/25/2019 FINDINGS: Patchy opacities in bilateral lung bases, new from prior. No visible pleural effusions. No pneumothorax. Similar cardiac silhouette. Aortic atherosclerosis. Left subclavian stent in similar position. Similar positioning of thoracic spinal stimulator leads in the  midthoracic region. IMPRESSION: New bibasilar opacities, which may represent atelectasis, aspiration, and/or pneumonia. Electronically Signed   By: Feliberto Harts MD   On: 10/08/2019 15:57   DG Chest Port 1 View  Result Date: 09/25/2019 CLINICAL DATA:  Shortness of breath EXAM: PORTABLE CHEST 1 VIEW COMPARISON:  September 22, 2019 FINDINGS: Endotracheal tube and nasogastric tube no longer evident. Thoracic stimulator leads in midthoracic region. There is mild bibasilar atelectasis. Lungs elsewhere are clear. The heart size and pulmonary vascularity are normal. No adenopathy. There is aortic atherosclerosis. There is a stent in the left subclavian artery. There is postoperative change in the lower cervical region. IMPRESSION: No pneumothorax. Mild bibasilar atelectasis. No edema or airspace opacity. Stable cardiac silhouette. Aortic Atherosclerosis (ICD10-I70.0). Electronically Signed   By: Bretta Bang III M.D.   On: 09/25/2019 09:41   DG Chest Port 1 View  Result Date: 09/22/2019 CLINICAL DATA:  Hypoxia EXAM: PORTABLE CHEST 1 VIEW COMPARISON:  September 21, 2019 FINDINGS: Endotracheal tube tip is 8.4 cm above the carina. Nasogastric tube tip and side port are below the diaphragm. Stimulator leads are overlying the midthoracic region. No pneumothorax. There is atelectatic change in the lung bases with partial clearing of airspace opacity from the bases. No new opacity evident. Heart is upper normal in size with pulmonary vascularity normal. No adenopathy. There is a stent in the left subclavian artery region. There is aortic atherosclerosis. No bone lesions. IMPRESSION: Tube and catheter positions as described without pneumothorax. Bibasilar atelectasis with partial clearing of airspace opacity from the bases. No new opacity. Stable cardiac silhouette. Aortic Atherosclerosis (ICD10-I70.0). Electronically Signed   By: Bretta Bang III M.D.   On: 09/22/2019 08:05   DG Chest Portable 1  View  Result Date: 09/21/2019 CLINICAL DATA:  Post intubation. EXAM: PORTABLE CHEST 1 VIEW COMPARISON:  09/21/2019 FINDINGS: There is a worsening airspace opacity at the right lung base. The endotracheal tube terminates above the carina by approximately 4.5 cm. The enteric tube appears to terminate at the level of the diaphragm. Coarse airspace opacities are noted at the lung bases. There is no pneumothorax or significant pleural effusion. A left subclavian artery stent is noted the lungs are hyperexpanded. Emphysematous changes  are again noted. IMPRESSION: 1. Lines and tubes as above. The enteric tube is not appear to extend into the stomach. Repositioning is recommended. 2. New airspace opacity at the right lung base concerning for worsening atelectasis or a developing infiltrate. There is persistent atelectasis at the left lung base. These results will be called to the ordering clinician or representative by the Radiologist Assistant, and communication documented in the PACS or [employer]. Electronically Signed   By: Katherine Mantle M.D.   On: 09/21/2019 18:30   DG Chest Port 1 View  Result Date: 09/21/2019 CLINICAL DATA:  64 year old with possible sepsis and acute mental status changes. Patient lethargic and unresponsive. Hypotension. Current smoker. Follow-up pneumonia. EXAM: PORTABLE CHEST 1 VIEW COMPARISON:  09/12/2019 and earlier, including CTA chest 06/12/2019. FINDINGS: Cardiac silhouette normal in size for AP portable technique, unchanged. Thoracic aorta atherosclerotic, unchanged. Prominent central pulmonary arteries, unchanged. Since the most recent prior examination 9 days ago, improved aeration in the lung bases with near complete resolution of the airspace opacities present at that time. Residual mild linear atelectasis at the RIGHT lung base. No new pulmonary parenchymal abnormalities. Emphysematous changes as noted on the prior CT. Dorsal cord stimulating device overlies the mid  thoracic spine, unchanged. IMPRESSION: Near complete resolution of the bibasilar pneumonia since the most recent prior examination 9 days ago. Residual mild linear atelectasis at the RIGHT lung base. Electronically Signed   By: Hulan Saas M.D.   On: 09/21/2019 16:05   DG Chest Portable 1 View  Result Date: 09/12/2019 CLINICAL DATA:  Shortness of breath and hypoxia. COPD. "Evaluate infiltrate " EXAM: PORTABLE CHEST 1 VIEW COMPARISON:  Most recent radiograph 06/14/2019 FINDINGS: Patchy opacity at both lung bases. Background bronchial thickening and interstitial coarsening. Stable heart size and mediastinal contours. Aortic atherosclerosis. Vascular stent in the region of the left subclavian. No pleural fluid or pneumothorax. Surgical hardware in the lower cervical spine partially included. Spinal stimulator in place. The bones appear under mineralized. IMPRESSION: Patchy opacity at both lung bases, may represent atelectasis, pneumonia, or aspiration. This has increased from imaging obtained 3 months ago. Electronically Signed   By: Narda Rutherford M.D.   On: 09/12/2019 21:28   ECHOCARDIOGRAM COMPLETE  Result Date: 09/14/2019    ECHOCARDIOGRAM REPORT   Patient Name:   Gregory Hall Date of Exam: 09/14/2019 Medical Rec #:  [id number]             Height:       74.0 in Accession #:    [id number]            Weight:       220.0 lb Date of Birth:  1955-11-24             BSA:          2.263 m Patient Age:    64 years              BP:           93/60 mmHg Patient Gender: M                     HR:           91 bpm. Exam Location:  [facility] Procedure: 2D Echo and Intracardiac Opacification Agent Indications:     CHF  History:         Patient has prior history of Echocardiogram examinations. CAD,  COPD; Risk Factors:Hypertension and Dyslipidemia.  Sonographer:     L Thornton-Maynard Referring Phys:  [id number] Carlton Adam Diagnosing Phys: Debbe Odea MD  Sonographer Comments: Technically  difficult study due to poor echo windows. Image acquisition challenging due to COPD. IMPRESSIONS  1. Left ventricular ejection fraction, by estimation, is 65 to 70%. The left ventricle has normal function. The left ventricle has no regional wall motion abnormalities. Left ventricular diastolic parameters are consistent with Grade II diastolic dysfunction (pseudonormalization).  2. Right ventricular systolic function is normal. The right ventricular size is normal. There is normal pulmonary artery systolic pressure.  3. The mitral valve is normal in structure. No evidence of mitral valve regurgitation.  4. The aortic valve was not well visualized. Aortic valve regurgitation not assessed.  5. Pulmonic valve regurgitation not assessed.  6. The inferior vena cava is normal in size with greater than 50% respiratory variability, suggesting right atrial pressure of 3 mmHg. FINDINGS  Left Ventricle: Left ventricular ejection fraction, by estimation, is 65 to 70%. The left ventricle has normal function. The left ventricle has no regional wall motion abnormalities. Definity contrast agent was given IV to delineate the left ventricular  endocardial borders. The left ventricular internal cavity size was normal in size. There is no left ventricular hypertrophy. Left ventricular diastolic parameters are consistent with Grade II diastolic dysfunction (pseudonormalization). Right Ventricle: The right ventricular size is normal. Right vetricular wall thickness was not assessed. Right ventricular systolic function is normal. There is normal pulmonary artery systolic pressure. The tricuspid regurgitant velocity is 2.20 m/s, and with an assumed right atrial pressure of 3 mmHg, the estimated right ventricular systolic pressure is 22.4 mmHg. Left Atrium: Left atrial size was normal in size. Right Atrium: Right atrial size was normal in size. Pericardium: There is no evidence of pericardial effusion. Mitral Valve: The mitral valve is  normal in structure. No evidence of mitral valve regurgitation. Tricuspid Valve: The tricuspid valve is grossly normal. Tricuspid valve regurgitation is mild. Aortic Valve: The aortic valve was not well visualized. Aortic valve regurgitation not assessed. Aortic valve peak gradient measures 7.1 mmHg. Pulmonic Valve: The pulmonic valve was not well visualized. Pulmonic valve regurgitation not assessed. Aorta: The aortic root is normal in size and structure. Venous: The inferior vena cava is normal in size with greater than 50% respiratory variability, suggesting right atrial pressure of 3 mmHg. IAS/Shunts: No atrial level shunt detected by color flow Doppler.  LEFT VENTRICLE PLAX 2D LVOT diam:     2.40 cm  Diastology LVOT Area:     4.52 cm LV e' lateral:   6.85 cm/s                         LV E/e' lateral: 11.5                         LV e' medial:    3.48 cm/s                         LV E/e' medial:  22.6  AORTIC VALVE                PULMONIC VALVE AV Area (Vmax): 4.22 cm    PV Vmax:       1.06 m/s AV Vmax:        133.00 cm/s PV Peak grad:  4.5 mmHg AV Peak Grad:   7.1  mmHg LVOT Vmax:      124.00 cm/s  AORTA Ao Root diam: 3.60 cm MITRAL VALVE               TRICUSPID VALVE MV Area (PHT): 2.10 cm    TR Peak grad:   19.4 mmHg MV Decel Time: 362 msec    TR Vmax:        220.00 cm/s MV E velocity: 78.80 cm/s MV A velocity: 91.70 cm/s  SHUNTS MV E/A ratio:  0.86        Systemic Diam: 2.40 cm Debbe Odea MD Electronically signed by Debbe Odea MD Signature Date/Time: 09/14/2019/12:52:49 PM    Final    ECHOCARDIOGRAM LIMITED  Result Date: 09/26/2019    ECHOCARDIOGRAM LIMITED REPORT   Patient Name:   Gregory Hall Date of Exam: 09/26/2019 Medical Rec #:  [id number]             Height:       72.0 in Accession #:    [id number]            Weight:       217.1 lb Date of Birth:  1955/07/22             BSA:          2.206 m Patient Age:    64 years              BP:           113/63 mmHg Patient Gender: M                      HR:           82 bpm. Exam Location:  [facility] Procedure: Limited Echo, Cardiac Doppler and Color Doppler Indications:     Atrial Flutter 427.32  History:         Patient has prior history of Echocardiogram examinations, most                  recent 09/14/2019. COPD, Signs/Symptoms:Chest Pain; Risk                  Factors:Hypertension and Diabetes.  Sonographer:     Cristela Blue RDCS (AE) Referring Phys:  [id number] Dois Davenport [facility] Diagnosing Phys: Julien Nordmann MD  Sonographer Comments: No apical window. Image acquisition challenging due to COPD. IMPRESSIONS  1. Left ventricular ejection fraction, by estimation, is 55 to 60%. The left ventricle has normal function. The left ventricle has no regional wall motion abnormalities. Indeterminate diastolic filling due to E-A fusion.  2. Right ventricular systolic function is normal. The right ventricular size is normal. There is normal pulmonary artery systolic pressure.  3. The mitral valve was not well visualized. No evidence of mitral valve regurgitation. No evidence of mitral stenosis.  4. The aortic valve was not well visualized. Aortic valve regurgitation is not visualized. Mild aortic valve sclerosis is present, with no evidence of aortic valve stenosis.  5. Normal sinus rhythm. FINDINGS  Left Ventricle: Left ventricular ejection fraction, by estimation, is 55 to 60%. The left ventricle has normal function. The left ventricle has no regional wall motion abnormalities. The left ventricular internal cavity size was normal in size. There is  no left ventricular hypertrophy. Indeterminate diastolic filling due to E-A fusion. Right Ventricle: The right ventricular size is normal. No increase in right ventricular wall thickness. Right ventricular systolic function is normal. There is normal pulmonary artery systolic pressure. The tricuspid  regurgitant velocity is 2.00 m/s, and  with an assumed right atrial pressure of 5 mmHg, the estimated right ventricular  systolic pressure is 21.0 mmHg. Left Atrium: Left atrial size was normal in size. Right Atrium: Right atrial size was normal in size. Pericardium: There is no evidence of pericardial effusion. Mitral Valve: The mitral valve was not well visualized. No evidence of mitral valve stenosis. Tricuspid Valve: The tricuspid valve is not well visualized. Tricuspid valve regurgitation is trivial. No evidence of tricuspid stenosis. Aortic Valve: The aortic valve was not well visualized. Aortic valve regurgitation is not visualized. Mild aortic valve sclerosis is present, with no evidence of aortic valve stenosis. Pulmonic Valve: The pulmonic valve was not well visualized. Pulmonic valve regurgitation is not visualized. Aorta: The aortic root was not well visualized, the ascending aorta was not well visualized and the aortic arch was not well visualized. Venous: The pulmonary veins were not well visualized. LEFT VENTRICLE PLAX 2D LVIDd:         4.13 cm LVIDs:         2.63 cm LV PW:         1.42 cm LV IVS:        1.11 cm LVOT diam:     2.00 cm LVOT Area:     3.14 cm  LEFT ATRIUM         Index LA diam:    4.00 cm 1.81 cm/m   AORTA Ao Root diam: 3.50 cm TRICUSPID VALVE TR Peak grad:   16.0 mmHg TR Vmax:        200.00 cm/s  SHUNTS Systemic Diam: 2.00 cm Julien Nordmann MD Electronically signed by Julien Nordmann MD Signature Date/Time: 09/26/2019/1:34:59 PM    Final (Updated)        Subjective: Feels much better. Sob and le edema has improved  Discharge Exam: Vitals:   10/10/19 0808 10/10/19 1108  BP: (!) 91/53 97/64  Pulse: 72 67  Resp: 18 19  Temp: 98.8 F (37.1 C) 98.7 F (37.1 C)  SpO2: 94% 94%   Vitals:   10/09/19 1954 10/10/19 0617 10/10/19 0808 10/10/19 1108  BP:  (!) 98/37 (!) 91/53 97/64  Pulse:  64 72 67  Resp:   18 19  Temp:  99.1 F (37.3 C) 98.8 F (37.1 C) 98.7 F (37.1 C)  TempSrc:  Oral Oral Oral  SpO2: 92% 92% 94% 94%  Weight:  90.8 kg    Height:        General: Pt is alert, awake,  not in acute distress Cardiovascular: RRR, S1/S2 +, no rubs, no gallops Respiratory: CTA bilaterally, no wheezing, no rhonchi Abdominal: Soft, NT, ND, bowel sounds + Extremities: +pitting edema, overall improved. RLE still warm and rubor in color    The results of significant diagnostics from this hospitalization (including imaging, microbiology, ancillary and laboratory) are listed below for reference.     Microbiology: Recent Results (from the past 240 hour(s))  Respiratory Panel by RT PCR (Flu A&B, Covid) - Nasopharyngeal Swab     Status: None   Collection Time: 10/08/19  3:35 PM   Specimen: Nasopharyngeal Swab  Result Value Ref Range Status   SARS Coronavirus 2 by RT PCR NEGATIVE NEGATIVE Final    Comment: (NOTE) SARS-CoV-2 target nucleic acids are NOT DETECTED.  The SARS-CoV-2 RNA is generally detectable in upper respiratoy specimens during the acute phase of infection. The lowest concentration of SARS-CoV-2 viral copies this assay can detect is 131 copies/mL. A negative result  does not preclude SARS-Cov-2 infection and should not be used as the sole basis for treatment or other patient management decisions. A negative result may occur with  improper specimen collection/handling, submission of specimen other than nasopharyngeal swab, presence of viral mutation(s) within the areas targeted by this assay, and inadequate number of viral copies (<131 copies/mL). A negative result must be combined with clinical observations, patient history, and epidemiological information. The expected result is Negative.  Fact Sheet for Patients:  https://www.moore.com/  Fact Sheet for Healthcare Providers:  https://www.young.biz/  This test is no t yet approved or cleared by the [place] FDA and  has been authorized for detection and/or diagnosis of SARS-CoV-2 by FDA under an Emergency Use Authorization (EUA). This EUA will remain  in effect  (meaning this test can be used) for the duration of the COVID-19 declaration under Section 564(b)(1) of the Act, 21 U.S.C. section 360bbb-3(b)(1), unless the authorization is terminated or revoked sooner.     Influenza A by PCR NEGATIVE NEGATIVE Final   Influenza B by PCR NEGATIVE NEGATIVE Final    Comment: (NOTE) The Xpert Xpress SARS-CoV-2/FLU/RSV assay is intended as an aid in  the diagnosis of influenza from Nasopharyngeal swab specimens and  should not be used as a sole basis for treatment. Nasal washings and  aspirates are unacceptable for Xpert Xpress SARS-CoV-2/FLU/RSV  testing.  Fact Sheet for Patients: https://www.moore.com/  Fact Sheet for Healthcare Providers: https://www.young.biz/  This test is not yet approved or cleared by the [place] FDA and  has been authorized for detection and/or diagnosis of SARS-CoV-2 by  FDA under an Emergency Use Authorization (EUA). This EUA will remain  in effect (meaning this test can be used) for the duration of the  Covid-19 declaration under Section 564(b)(1) of the Act, 21  U.S.C. section 360bbb-3(b)(1), unless the authorization is  terminated or revoked. Performed at [facility], [address] [place], [place] [address]      Labs: BNP (last 3 results) Recent Labs    09/21/19 1527 10/08/19 1530 10/10/19 0622  BNP 247.5* 513.8* 276.0*   Basic Metabolic Panel: Recent Labs  Lab 10/06/19 0829 10/08/19 1530 10/09/19 0338 10/10/19 0622  NA 143 141 143 139  K 5.0 4.3 3.6 3.4*  CL 95* 96* 95* 93*  CO2 32* 33* 35* 39*  GLUCOSE 99 65* 104* 153*  BUN 23 21 16 12   CREATININE 1.99* 1.30* 1.12 1.13  CALCIUM 8.7 8.7* 8.3* 8.8*   Liver Function Tests: Recent Labs  Lab 10/06/19 0829 10/08/19 1530  AST 19 16  ALT 15 13  ALKPHOS 98 84  BILITOT 0.2 0.6  PROT 6.2 6.7  ALBUMIN 3.9 3.7   No results for input(s): LIPASE, AMYLASE in the last 168 hours. No results for  input(s): AMMONIA in the last 168 hours. CBC: Recent Labs  Lab 10/06/19 0829 10/08/19 1530  WBC 8.0 6.8  NEUTROABS 4.4  --   HGB 11.3* 10.9*  HCT 36.2* 36.3*  MCV 88 92.6  PLT 470* 362   Cardiac Enzymes: Recent Labs  Lab 10/08/19 1530  CKTOTAL 81   BNP: Invalid input(s): POCBNP CBG: Recent Labs  Lab 10/09/19 1228 10/09/19 1634 10/09/19 2114 10/10/19 0810 10/10/19 1155  GLUCAP 155* 171* 154* 130* 155*   D-Dimer No results for input(s): DDIMER in the last 72 hours. Hgb A1c No results for input(s): HGBA1C in the last 72 hours. Lipid Profile No results for input(s): CHOL, HDL, LDLCALC, TRIG, CHOLHDL, LDLDIRECT in the  last 72 hours. Thyroid function studies No results for input(s): TSH, T4TOTAL, T3FREE, THYROIDAB in the last 72 hours.  Invalid input(s): FREET3 Anemia work up No results for input(s): VITAMINB12, FOLATE, FERRITIN, TIBC, IRON, RETICCTPCT in the last 72 hours. Urinalysis    Component Value Date/Time   COLORURINE YELLOW (A) 10/08/2019 1130   APPEARANCEUR CLEAR (A) 10/08/2019 1130   APPEARANCEUR Clear 10/28/2013 2215   LABSPEC 1.009 10/08/2019 1130   LABSPEC 1.002 10/28/2013 2215   PHURINE 6.0 10/08/2019 1130   GLUCOSEU NEGATIVE 10/08/2019 1130   GLUCOSEU Negative 10/28/2013 2215   HGBUR NEGATIVE 10/08/2019 1130   BILIRUBINUR NEGATIVE 10/08/2019 1130   BILIRUBINUR Negative 10/28/2013 2215   KETONESUR NEGATIVE 10/08/2019 1130   PROTEINUR NEGATIVE 10/08/2019 1130   UROBILINOGEN 1.0 03/29/2009 0840   NITRITE NEGATIVE 10/08/2019 1130   LEUKOCYTESUR NEGATIVE 10/08/2019 1130   LEUKOCYTESUR Negative 10/28/2013 2215   Sepsis Labs Invalid input(s): PROCALCITONIN,  WBC,  LACTICIDVEN Microbiology Recent Results (from the past 240 hour(s))  Respiratory Panel by RT PCR (Flu A&B, Covid) - Nasopharyngeal Swab     Status: None   Collection Time: 10/08/19  3:35 PM   Specimen: Nasopharyngeal Swab  Result Value Ref Range Status   SARS Coronavirus 2 by RT PCR  NEGATIVE NEGATIVE Final    Comment: (NOTE) SARS-CoV-2 target nucleic acids are NOT DETECTED.  The SARS-CoV-2 RNA is generally detectable in upper respiratoy specimens during the acute phase of infection. The lowest concentration of SARS-CoV-2 viral copies this assay can detect is 131 copies/mL. A negative result does not preclude SARS-Cov-2 infection and should not be used as the sole basis for treatment or other patient management decisions. A negative result may occur with  improper specimen collection/handling, submission of specimen other than nasopharyngeal swab, presence of viral mutation(s) within the areas targeted by this assay, and inadequate number of viral copies (<131 copies/mL). A negative result must be combined with clinical observations, patient history, and epidemiological information. The expected result is Negative.  Fact Sheet for Patients:  https://www.moore.com/  Fact Sheet for Healthcare Providers:  https://www.young.biz/  This test is no t yet approved or cleared by the [place] FDA and  has been authorized for detection and/or diagnosis of SARS-CoV-2 by FDA under an Emergency Use Authorization (EUA). This EUA will remain  in effect (meaning this test can be used) for the duration of the COVID-19 declaration under Section 564(b)(1) of the Act, 21 U.S.C. section 360bbb-3(b)(1), unless the authorization is terminated or revoked sooner.     Influenza A by PCR NEGATIVE NEGATIVE Final   Influenza B by PCR NEGATIVE NEGATIVE Final    Comment: (NOTE) The Xpert Xpress SARS-CoV-2/FLU/RSV assay is intended as an aid in  the diagnosis of influenza from Nasopharyngeal swab specimens and  should not be used as a sole basis for treatment. Nasal washings and  aspirates are unacceptable for Xpert Xpress SARS-CoV-2/FLU/RSV  testing.  Fact Sheet for Patients: https://www.moore.com/  Fact Sheet for  Healthcare Providers: https://www.young.biz/  This test is not yet approved or cleared by the [place] FDA and  has been authorized for detection and/or diagnosis of SARS-CoV-2 by  FDA under an Emergency Use Authorization (EUA). This EUA will remain  in effect (meaning this test can be used) for the duration of the  Covid-19 declaration under Section 564(b)(1) of the Act, 21  U.S.C. section 360bbb-3(b)(1), unless the authorization is  terminated or revoked. Performed at [facility], [address], [place], [place] [address]  Time coordinating discharge: Over 30 minutes  SIGNED:   Lynn Ito, MD  [facility] 10/10/2019, 1:54 PM Pager   If 7PM-7AM, please contact night-coverage [email or web address] Password [email or web address]

## 2019-10-10 NOTE — Progress Notes (Addendum)
   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  Comorbidities:  Coronary artery disease with stenting COPD Hypertension Hyperlipidemia Atrial Flutter, recent DCCV  Labs:  Sodium 139, potassium 3.4, BUN 12, Creatinine 1.13. BNP 276( 513 yesterday)  Medications:  Amiodarone 200 mg daily Aspirin 81 mg daily Lipitor 40 mg daily Bisoprolol 2.5 mg daily Furosemide 20 mg IV daily Midodrine 10 mg TID Xarelto 20 mg with supper  Intake not documented Output- 1400 ml  Weight- 90.8 kg ( 91.9 yesterday)   Assessment:  General- he is awake lying in bed, does not appear to be in any acute distress, states he is tired and in a lot of pain.(Chronic back and neck)  HEENT-Pupils are equal.  Cardiac- heart tones regular rate.  Chest-Expiratory wheezes through out.  Abdomen-soft, non tender.  Musculoskeletal- non pitting  Neuro- moves all extremities, speech clear.  Psych- answers appropriately.   Patient states he is very tired today from not sleeping last night due to his chronic pain.  Briefly discussed the Heart Failure Magnet.   Tresa Endo RN, CHFN

## 2019-10-10 NOTE — Progress Notes (Signed)
Ems on the floor to transport patient back to group home. Patient on chronic 02 at 3l. Personal wheelchair to be sent back with ems. Packet given to ems. Patient has no distress. Group home was called.

## 2019-10-17 ENCOUNTER — Ambulatory Visit (INDEPENDENT_AMBULATORY_CARE_PROVIDER_SITE_OTHER): Payer: Medicare Other | Admitting: Physician Assistant

## 2019-10-17 ENCOUNTER — Other Ambulatory Visit: Payer: Self-pay

## 2019-10-17 ENCOUNTER — Encounter: Payer: Self-pay | Admitting: Physician Assistant

## 2019-10-17 VITALS — BP 80/42 | HR 68 | Ht 72.0 in | Wt 201.5 lb

## 2019-10-17 DIAGNOSIS — I10 Essential (primary) hypertension: Secondary | ICD-10-CM | POA: Diagnosis not present

## 2019-10-17 DIAGNOSIS — R251 Tremor, unspecified: Secondary | ICD-10-CM

## 2019-10-17 DIAGNOSIS — E785 Hyperlipidemia, unspecified: Secondary | ICD-10-CM

## 2019-10-17 DIAGNOSIS — I5031 Acute diastolic (congestive) heart failure: Secondary | ICD-10-CM

## 2019-10-17 DIAGNOSIS — I771 Stricture of artery: Secondary | ICD-10-CM

## 2019-10-17 DIAGNOSIS — I2510 Atherosclerotic heart disease of native coronary artery without angina pectoris: Secondary | ICD-10-CM | POA: Diagnosis not present

## 2019-10-17 DIAGNOSIS — I4892 Unspecified atrial flutter: Secondary | ICD-10-CM | POA: Diagnosis not present

## 2019-10-17 DIAGNOSIS — E119 Type 2 diabetes mellitus without complications: Secondary | ICD-10-CM

## 2019-10-17 DIAGNOSIS — I5032 Chronic diastolic (congestive) heart failure: Secondary | ICD-10-CM | POA: Diagnosis not present

## 2019-10-17 DIAGNOSIS — Z794 Long term (current) use of insulin: Secondary | ICD-10-CM

## 2019-10-17 DIAGNOSIS — F419 Anxiety disorder, unspecified: Secondary | ICD-10-CM

## 2019-10-17 DIAGNOSIS — J449 Chronic obstructive pulmonary disease, unspecified: Secondary | ICD-10-CM

## 2019-10-17 NOTE — Progress Notes (Signed)
Office Visit    Patient Name: Gregory Hall Date of Encounter: 10/17/2019  Primary Care Provider:  Enid Baas, MD Primary Cardiologist:  Lorine Bears, MD  Chief Complaint    Chief Complaint  Patient presents with  . OTHER    F/u [facility] CHF c/o edema ankles. Meds reviewed verbally with pt.    63 year old male with history of CAD s/p PCI to the LAD in 2010, HFpEF, atrial flutter s/p DCCV 09/26/2019, PAD with prior stenting to the left subclavian artery, CKD stage III, IDDM, hypertension, hyperlipidemia, psychiatric history, chronic hypoxic respiratory failure on supplemental home oxygen secondary to COPD secondary to tobacco use, esophageal spasm, erosive gastritis, GERD, anxiety/depression, chronic back pain, and who presents today for follow-up after recent 10/08/19 admission for acute on chronic HFpEF.  Past Medical History    Past Medical History:  Diagnosis Date  . AKI (acute kidney injury) (HCC) 09/12/2019  . CAD (coronary artery disease)    s/p PTCA and stent x2  . Chest pain   . CHF (congestive heart failure) (HCC)   . Chronic pain syndrome   . COPD (chronic obstructive pulmonary disease) (HCC)   . Degenerative cervical disc   . Depression   . Diabetes mellitus without complication (HCC)   . Dyslipidemia   . GERD (gastroesophageal reflux disease)   . Hernia 2014  . Hypertension   . MRSA (methicillin resistant staph aureus) culture positive 2011  . Neuropathy   . Nutcracker esophagus   . Rectus diastasis 07/19/2012   Past Surgical History:  Procedure Laterality Date  . BACK SURGERY  2012  . CARDIOVERSION N/A 09/26/2019   Procedure: CARDIOVERSION;  Surgeon: Antonieta Iba, MD;  Location: [facility] ORS;  Service: Cardiovascular;  Laterality: N/A;  . CHOLECYSTECTOMY    . COLONOSCOPY  Jan 2014   [name]  . CORONARY ANGIOPLASTY WITH STENT PLACEMENT  2009   stents x2, in [place], [place]  . FOOT SURGERY     Right  . NECK SURGERY    . SPINE SURGERY   2012,2013  . TONSILLECTOMY      Allergies  Allergies  Allergen Reactions  . Acetaminophen Other (See Comments)    Reaction:  Unknown  Kidney failure Told not to take from home M.D. Related to kidney and renal failure   . Fentanyl   . Nsaids Other (See Comments)    Reaction:  Unknown  Kidney failure Patient states not to take from home M.D. Related to kidney and renal failure.  Other reaction(s): Unknown    History of Present Illness    Gregory Hall is a 64 y.o. male with PMH as above.  He has history of CAD s/p PCI to the LAD in 2010, HFpEF, atrial flutter s/p DCCV 09/26/2019, PAD with prior stenting to the left subclavian artery, CKD stage III, IDDM, hypertension, hyperlipidemia, psychiatric history, chronic hypoxic respiratory failure on supplemental home oxygen via nasal cannula at 3 L secondary to COPD secondary to tobacco use, esophageal spasms, as course of gastritis, GERD, anxiety/depression, and chronic back pain s/p multiple surgeries.   He has known lower blood pressure in the left arm with carotid Doppler 08/2014 showing mild nonhemodynamically significant bilateral carotid artery disease with no evidence of subclavian artery stenosis.  Echo 2016 for murmur showed normal LVSF with no significant valvular abnormalities.  He was evaluated 07/20/2015 for preprocedure risk stratification prior to colonoscopy.  He underwent MPI 07/2015 without ischemia and ruled low risk scan.  He was admitted to  [facility] 10/2016 for chest pain and unable to complete outpatient dobutamine stress test.   He underwent LHC 10/2016 that showed left main 30% stenosis, patent proximal LAD stent with minor luminal irregularities involving the LAD, left circumflex, and RCA.  LVEF 60%, mildly elevated LVEDP of 17 mmHg.  He was then lost to follow-up.  He was seen in the ED March and May 2021 for falls.  He was admitted to [facility] 06/2019 for COPD exacerbation. He was admitted again with acute on chronic  respiratory failure with hypoxia and hypercapnia in the setting of AE of COPD and acute on chronic HFpEF.  He was treated for COPD flare and IV diuresed.  09/14/2019 echo showed EF 65 to 70%, no RWMA, G2DD.  He was admitted to [facility] 9/12-9/19 for metabolic encephalopathy secondary to acute on chronic hypoxic and hypercapnic respiratory failure requiring mechanical ventilation secondary to AE of COPD and acute on chronic HFpEF, hypotension requiring vasopressor support, ARF requiring temporary dialysis, and new onset atrial flutter with RVR s/p DCCV.  Limited echo 09/26/2019 showed EF 55 to 60%.  High-sensitivity troponin minimally elevated with peak 78 and felt secondary to supply demand ischemia.  BP soft during admission and placed on midodrine.  Discharge weight 95.6 kg.  He was seen at follow-up 9/27 and doing well from a cardiac perspective.  He noted some lower extremity edema, though improved when compared during admission.  He indicated this was a longstanding issue for him and at times having to titrate his Lasix up to 80 mg for a brief period of time.  He reported not salting his food unless eating [place] fries, approximately once every 2 weeks.  He developed a resting tremor 9/24, which was reportedly new for him.  Due to recent AKI, Lasix was not uptitrated and recommendation was for continued leg elevation and compression stockings.  He was seen at his PCP 10/08/2019 and was instructed to go to the emergency department due to bilateral lower extremity edema. He was subsequently admitted 9/29-10/1/21 for [facility] HFpEF.  He reported chronic 6 pillow orthopnea due to back pain and shortness of breath.  He was chronically on 3 L nasal cannula at home.  He was living at assisted living and reported compliance with medications.  He was reportedly mostly wheelchair bound.  He was admitted and IV diuresed and sent home on daily Lasix 20mg .  It was felt that he may have possible right lower extremity cellulitis,  given rubor and warmth.  He was started on Keflex to treat this possible cellulitis.  Of note, cardiology was not consulted during this time. Discharge wt 90.8kg.   Today, 10/17/2019, he presents to clinic and notes that he is frustrated with his recent cycles in and out of the hospital.  He denies chest pain, tachypalpitations, presyncope, syncope, or falls. He sometimes hears his heart beat in his head. He reports significant LEE and is elevating his legs without significant relief of symptoms. He finished his Keflex with right lower extremity still suspicious for cellulitis. We discussed that his PCP should likely reassess him to see if an additional course of abx is indicated. Given his LEE, he feels he needs to be on a higher dose of Lasix with recommendations as below. He reports ongoing malaise and weakness. No abdominal distention, PND, or early satiety.  He reports ongoing 6 pillow orthopnea.  In fact, most nights, he usually now sleeps in a recliner chair.  He reports medication compliance.  No signs or symptoms  of bleeding on anticoagulation. BP taken in L arm and thus likely falsely low with message then sent to nursing facility to ensure that they check it on the R arm. He reports BP usually 90/60 and asx at this pressure. He is staying at [facility].   Home Medications    Prior to Admission medications   Medication Sig Start Date End Date Taking? Authorizing Provider  amiodarone (PACERONE) 200 MG tablet Take 200 mg by mouth daily. Patient not taking: Reported on 10/08/2019    [provider]  aspirin EC 81 MG tablet Take 81 mg by mouth daily.    [provider]  atorvastatin (LIPITOR) 40 MG tablet Take 1 tablet (40 mg total) by mouth daily. 09/28/19   Lewie Chamber, MD  bisoprolol (ZEBETA) 5 MG tablet Take 0.5 tablets (2.5 mg total) by mouth daily. 09/29/19   Lewie Chamber, MD  Cholecalciferol (VITAMIN D3) 50 MCG (2000 UT) TABS Take 2,000 Units by mouth daily.      [provider]  cyclobenzaprine (FLEXERIL) 5 MG tablet Take 1 tablet (5 mg total) by mouth 3 (three) times daily as needed. Patient taking differently: Take 5 mg by mouth 2 (two) times daily as needed for muscle spasms.  05/25/19   Menshew, Charlesetta Ivory, PA-C  DULoxetine (CYMBALTA) 60 MG capsule Take 60 mg by mouth daily.    [provider]  Ensure Max Protein (ENSURE MAX PROTEIN) LIQD Take 330 mLs (11 oz total) by mouth 2 (two) times daily between meals. 06/14/19   Marguerita Merles Latif, DO  furosemide (LASIX) 20 MG tablet Take 1 tablet (20 mg total) by mouth daily. Patient taking differently: Take 20 mg by mouth daily as needed for fluid.  09/14/19   Danford, Earl Lites, MD  insulin glargine (LANTUS) 100 UNIT/ML injection Inject 22 Units into the skin at bedtime.     [provider]  insulin lispro (HUMALOG) 100 UNIT/ML injection Inject 4 Units into the skin 3 (three) times daily before meals. Hold for FSBG <100    [provider]  ipratropium-albuterol (DUONEB) 0.5-2.5 (3) MG/3ML SOLN Take 3 mLs by nebulization every 6 (six) hours as needed. Patient taking differently: Take 3 mLs by nebulization every 6 (six) hours as needed (wheezing and shortness of breath).  06/14/19   Marguerita Merles Latif, DO  lactulose (CHRONULAC) 10 GM/15ML solution Take 20 g by mouth daily as needed for mild constipation.    [provider]  lamoTRIgine (LAMICTAL) 100 MG tablet Take 100 mg by mouth daily.    [provider]  Menthol-Methyl Salicylate (SALONPAS PAIN RELIEF PATCH EX) Place 1 patch onto the skin every 12 (twelve) hours as needed (aches and pains).    [provider]  metFORMIN (GLUCOPHAGE-XR) 750 MG 24 hr tablet Take 1 tablet (750 mg total) by mouth 2 (two) times daily. 09/16/19   Danford, Earl Lites, MD  midodrine (PROAMATINE) 10 MG tablet Take 1 tablet (10 mg total) by mouth every 8 (eight) hours. 09/28/19   Lewie Chamber, MD  NAC 600 MG CAPS Take  600 mg by mouth daily. 09/08/19   [provider]  nicotine (NICODERM CQ - DOSED IN MG/24 HOURS) 21 mg/24hr patch Place onto the skin.    [provider]  NITROSTAT 0.4 MG SL tablet DISSOLVE (1) TABLET UNDER TONGUE AS NEEDED TO RELIEVE CHEST PAIN. MAYREPEAT EVERY 5 MINUTES. Patient taking differently: Place 0.4 mg under the tongue every 5 (five) minutes as needed for  chest pain.  10/06/15   Iran Ouch, MD  Oxycodone HCl 10 MG TABS Take 1 tablet (10 mg total) by mouth 3 (three) times daily. 09/14/19   Danford, Earl Lites, MD  oxymetazoline (AFRIN) 0.05 % nasal spray Place 2 sprays into both nostrils 2 (two) times daily as needed for congestion.    [provider]  pantoprazole (PROTONIX) 40 MG tablet Take 40 mg by mouth daily.     [provider]  predniSONE (DELTASONE) 5 MG tablet Take 2.5 mg by mouth daily with breakfast.    [provider]  pregabalin (LYRICA) 200 MG capsule Take 1 capsule (200 mg total) by mouth 3 (three) times daily. 09/14/19   Danford, Earl Lites, MD  rivaroxaban (XARELTO) 20 MG TABS tablet Take 20 mg by mouth daily with supper.    [provider]  senna-docusate (SENOKOT-S) 8.6-50 MG tablet Take 1 tablet by mouth daily.     [provider]  tamsulosin (FLOMAX) 0.4 MG CAPS capsule Take 1 capsule (0.4 mg total) by mouth daily after supper. 10/10/19   Lynn Ito, MD  tiotropium (SPIRIVA) 18 MCG inhalation capsule Place into inhaler and inhale.    [provider]  traZODone (DESYREL) 100 MG tablet Take 100 mg by mouth at bedtime. 09/08/19   [provider]    Review of Systems    He denies chest pain, pnd, n, v, dizziness, palpitations, syncope, weight gain, or early satiety.  He reports improved breathing but ongoing lower extremity edema and orthopnea.  He notes malaise and weakness. All other systems reviewed and are otherwise negative except as noted above.  Physical Exam    VS:  BP  (!) 80/42 (BP Location: Left Arm, Patient Position: Sitting, Cuff Size: Normal)   Pulse 68   Ht 6' (1.829 m)   Wt 201 lb 8 oz (91.4 kg)   SpO2 90%   BMI 27.33 kg/m  , BMI Body mass index is 27.33 kg/m. GEN: Male seated in wheelchair next to exam bed. HEENT: normal. Neck: Supple, no JVD, carotid bruits, or masses. Cardiac: RRR, no murmurs, rubs, or gallops. No clubbing, cyanosis, 1-2+ bilateral edema R>L with bilateral erythema R>L and skin changes suspicious for ongoing cellulitis on the right leg and close to the ankle.  Radials/DP/PT 2+ and equal bilaterally.  Respiratory:  Respirations regular and unlabored,  expiratory wheeze, coarse breath sounds bilaterally, trace bibasilar crackles.  GI: Soft, nontender, nondistended, BS + x 4. MS: no deformity or atrophy. Skin: warm and dry, no rash. Neuro:  Strength and sensation are intact. Tremor noted.  Psych: Normal affect.  Accessory Clinical Findings    ECG personally reviewed by me today -NSR, 68 bpm, left axis deviation, PR interval 188 ms, QRS 86 ms, QTC 429- no acute changes.  VITALS Reviewed today   Temp Readings from Last 3 Encounters:  10/10/19 98.5 F (36.9 C) (Oral)  09/28/19 98.8 F (37.1 C) (Oral)  09/14/19 98.6 F (37 C) (Oral)   BP Readings from Last 3 Encounters:  10/17/19 (!) 80/42  10/10/19 133/60  10/06/19 134/60  NOTE: Repeat BP 10/17/19 at facility in R arm with SBP 100-110s Pulse Readings from Last 3 Encounters:  10/17/19 68  10/10/19 78  10/06/19 63    Wt Readings from Last 3 Encounters:  10/17/19 201 lb 8 oz (91.4 kg)  10/10/19 200 lb 1.6 oz (90.8 kg)  10/06/19 207 lb 8 oz (94.1 kg)     LABS  reviewed today  Lab Results  Component Value Date   WBC 6.8 10/08/2019   HGB 10.9 (L) 10/08/2019   HCT 36.3 (L) 10/08/2019   MCV 92.6 10/08/2019   PLT 362 10/08/2019   Lab Results  Component Value Date   CREATININE 1.13 10/10/2019   BUN 12 10/10/2019   NA 139 10/10/2019   K 3.4 (L) 10/10/2019    CL 93 (L) 10/10/2019   CO2 39 (H) 10/10/2019   Lab Results  Component Value Date   ALT 13 10/08/2019   AST 16 10/08/2019   ALKPHOS 84 10/08/2019   BILITOT 0.6 10/08/2019   Lab Results  Component Value Date   CHOL 97 09/25/2019   HDL 28 (L) 09/25/2019   LDLCALC 46 09/25/2019   TRIG 116 09/25/2019   CHOLHDL 3.5 09/25/2019    Lab Results  Component Value Date   HGBA1C 7.0 (H) 09/13/2019   Lab Results  Component Value Date   TSH 0.256 (L) 09/21/2019     STUDIES/PROCEDURES reviewed today   Limited 2D echo 09/26/2019: 1. Left ventricular ejection fraction, by estimation, is 55 to 60%. The  left ventricle has normal function. The left ventricle has no regional  wall motion abnormalities. Indeterminate diastolic filling due to E-A  fusion.  2. Right ventricular systolic function is normal. The right ventricular  size is normal. There is normal pulmonary artery systolic pressure.  3. The mitral valve was not well visualized. No evidence of mitral valve  regurgitation. No evidence of mitral stenosis.  4. The aortic valve was not well visualized. Aortic valve regurgitation  is not visualized. Mild aortic valve sclerosis is present, with no  evidence of aortic valve stenosis.  5. Normal sinus rhythm. _________  2D echo 09/14/2019: 1. Left ventricular ejection fraction, by estimation, is 65 to 70%. The  left ventricle has normal function. The left ventricle has no regional  wall motion abnormalities. Left ventricular diastolic parameters are  consistent with Grade II diastolic  dysfunction (pseudonormalization).  2. Right ventricular systolic function is normal. The right ventricular  size is normal. There is normal pulmonary artery systolic pressure.  3. The mitral valve is normal in structure. No evidence of mitral valve  regurgitation.  4. The aortic valve was not well visualized. Aortic valve regurgitation  not assessed.  5. Pulmonic valve regurgitation not  assessed.  6. The inferior vena cava is normal in size with greater than 50%  respiratory variability, suggesting right atrial pressure of 3 mmHg. __________  LHC 10/2016: Conclusions:   Nonobstructive coronary artery disease with wide open LAD stent. 30% in  the ostial left main   Normal left ventricle size and function with an ejection fraction of 60%   Mildly elevated left and end diastolic pressure at 17 mmHg   FINDINGS:   Dominance: Right   Left Main: 30% ostial left main.   LAD: LAD proximal stent is wide open. Minor luminal irregularities with  no crackles stenosis   CIRC: Minor luminal irregularities and no critical stenosis   RCA: Minor luminal irregularities with no crackles stenosis   Left Ventriculogram:  Normal left medical size and function with an  ejection fraction of 60%   Hemodynamics: Mildly elevated left and end diastolic pressure at 17 mmHg  __________  Eugenie Birks MPI 07/2015: Pharmacological myocardial perfusion imaging study with no significant ischemia Normal wall motion, EF estimated at 92% No EKG changes concerning for ischemia at peak stress or in recovery. Baseline EKG with diffuse T wave abnormality Low risk  scan __________  2D echo 08/2014: - Left ventricle: The cavity size was normal. Wall thickness was  normal. Systolic function was normal. The estimated ejection  fraction was in the range of 60% to 65%. Wall motion was normal;  there were no regional wall motion abnormalities. Left  ventricular diastolic function parameters were normal.   Impressions:   - Normal study.   Assessment & Plan    HFpEF --Reports improved volume status since recent admission, though still feels volume up. Recently admitted 9/30-10/1 for [facility] HFpEF and IV diuresed with discharge wt 90.8kg (10/1) and wt today in clinic 91.4kg (10/8), which is significantly reduced from his previous clinic wt of 94.1kg (9/27). Discharge BNP 276.0. LEE appears  out of proportion with his current volume status. Of note, echo has shown low RVSP. In addition, RLE suspicious for ongoing cellulitis with recommendation that his PCP be contracted regarding an additional course of abx. Given his recent IV diuresis with discharge K 3.4, will obtain BMET to reassess renal function and electrolytes. BP unfortunately taken on L arm this visit with living facility then immediately contacted and BP check on R arm reported as still soft with SBP 100s-110s. Facility instructed to restart daily lasix if R arm SBP remains above 100 but continue to hold Bisoprolol for now and monitor R arm BP and HR over the weekend. Given he has historically had to go up to Lasix  daily in the past to assist with LEE, if rates controlled off of BB, we may have enough room in BP to be able to escalate diuresis on Monday if renal function / K, BP, and HR allow. Case discussed with DOD during time of visit with plan approved to hold BB but continue Lasix based on room in BP and edema. Continue leg elevation and compression stockings. Recommend fluid intake under 2L and salt intake under 2g. Facility instructed by [facility] RN to monitor HR/BP (BP only in R arm) closely over the weekend and report back with rates and pressures on Monday via a phone call.   RLE cellulitis --As above, RLE suspicious for ongoing cellulitis. [facility] RN will contact facility and PCP to recommend an additional course of abx to treat this ongoing infection. He just finished a course of Keflex. He has ongoing bilateral lower extremity edema with recommendations as above.    Atrial flutter with RVR s/p DCCV 9/17 --Maintaining NSR with rates controlled s/p DCCV 9/17.  Developed atrial flutter 9/13. Recurrent atrial flutter 9/16 without response to IV digoxin and IV amiodarone; therefore, underwent DCCV 9/17. --Continue current amiodarone  daily.  --BB / Bisoprolol held to allow for ongoing and possible increased  diuresis without AKI.  --Continue Xarelto for OAC. CHA2DS2VASc score of at least 5 (CHF, HTN, agex1, DM2 vascular). No s/sx of bleeding. He does have anemia of chronic dz.   CAD without angina --No CP or sx concerning for angina. S/p PCI to LAD 2010.  Continue Lipitor. Discontinue ASA on Xarelto. As above, case discussed with DOD with recommendation to hold bisoprolol for now to allow for possible escalation of diuresis if BP and Cr/K allow.  Revisit Lexiscan at RTC.  COPD --Continue current breathing treatments/ inhalers.   HTN --BP well controlled to soft. R arm pressures taken at facility to confirm that BP remains soft. Continue current midodrine  q8h. As above, and after discussion with DOD, BB currently on hold with plan to up-titrate lasix if BP/HR/Cr allows after we check in again on Monday  with his facility. Of note, he reports asx if SBP 90s.  HLD --LDL 46 from 09/2019 with LFTs wnl. Continue current atorvastatin.  L subclavian artery stenosis --As above, BP lower in the L arm with recommendation for R arm pressures. His facility was called to inform that R arm pressures should be taken when deciding if to administer medications. He reports he is asx if SBP 90s.  Anemia --Likely of chronic dz. Recommend discontinue ASA given on Xarelto for anticoagulation. Repeat CBC.  Tobacco use / COPD --Smoking cessation advised.  Follow-up with PCP as directed.   Tremor --Amiodarone reportedly stopped in the past to see if washout improved tremor. Since that time, amiodarone has been restarted with tremor still seen on today's exam. Case discussed with DOD. Will continue amiodarone at this time.   Medication changes: Discussed with DOD. Hold Bisoprolol. Discontinue ASA as on Xarelto. Continue Lasix 20mg  daily if SBP remains above 100 at facility when BP checked in the R arm (not the LUE). Up-titration of lasix TBD when facility calls 10/1 with BP/HR readings and based on Cr/K.  Additional abx for RLE cellulitis per PCP.  Labs ordered: BMET, Mg, TSH, FT4, CBC Studies / Imaging ordered: None Future considerations: Up-titration of Lasix; restart of BB pending facility report and repeat labs Disposition: RTC 2 weeks   , PA-C 10/17/2019

## 2019-10-17 NOTE — Progress Notes (Signed)
Spoke with caregiver at [facility] and reviewed orders to check R arm blood pressure and daily weights. Advised that we want them to check blood pressures in the right arm due to subclavian stenosis in the left arm. She verbalized understanding with no further questions at this time.

## 2019-10-17 NOTE — Patient Instructions (Addendum)
[facility] Orders to please check blood pressure 2 hours after medications and daily weights. Keflex did not help. Please try something different.  Medication Instructions:  Your physician has recommended you make the following change in your medication:  1. STOP Bisoprolol 2. CHANGE Furosemide/Lasix 20 mg as needed   *If you need a refill on your cardiac medications before your next appointment, please call your pharmacy*   Lab Work: BMET, CBC, TSH, Free T4, Mag levels done today. If you have labs (blood work) drawn today and your tests are completely normal, you will receive your results only by: Marland Kitchen MyChart Message (if you have MyChart) OR . A paper copy in the mail If you have any lab test that is abnormal or we need to change your treatment, we will call you to review the results.   Testing/Procedures: None   Follow-Up: At [facility], you and your health needs are our priority.  As part of our continuing mission to provide you with exceptional heart care, we have created designated Provider Care Teams.  These Care Teams include your primary Cardiologist (physician) and Advanced Practice Providers (APPs -  Physician Assistants and Nurse Practitioners) who all work together to provide you with the care you need, when you need it.  We recommend signing up for the patient portal called "MyChart".  Sign up information is provided on this After Visit Summary.  MyChart is used to connect with patients for Virtual Visits (Telemedicine).  Patients are able to view lab/test results, encounter notes, upcoming appointments, etc.  Non-urgent messages can be sent to your provider as well.   To learn more about what you can do with MyChart, go to [email or web address].    Your next appointment:   2 week(s)  The format for your next appointment:   In Person  Provider:   You may see Lorine Bears, MD or one of the following Advanced Practice Providers on your designated Care Team:     Nicolasa Ducking, NP  Eula Listen, PA-C  Marisue Ivan, PA-C  Cadence [place], [place]

## 2019-10-18 LAB — CBC
Hematocrit: 33.5 % — ABNORMAL LOW (ref 37.5–51.0)
Hemoglobin: 10.4 g/dL — ABNORMAL LOW (ref 13.0–17.7)
MCH: 26.9 pg (ref 26.6–33.0)
MCHC: 31 g/dL — ABNORMAL LOW (ref 31.5–35.7)
MCV: 87 fL (ref 79–97)
Platelets: 280 10*3/uL (ref 150–450)
RBC: 3.87 x10E6/uL — ABNORMAL LOW (ref 4.14–5.80)
RDW: 16 % — ABNORMAL HIGH (ref 11.6–15.4)
WBC: 6.5 10*3/uL (ref 3.4–10.8)

## 2019-10-18 LAB — BASIC METABOLIC PANEL
BUN/Creatinine Ratio: 8 — ABNORMAL LOW (ref 10–24)
BUN: 11 mg/dL (ref 8–27)
CO2: 26 mmol/L (ref 20–29)
Calcium: 8.7 mg/dL (ref 8.6–10.2)
Chloride: 100 mmol/L (ref 96–106)
Creatinine, Ser: 1.3 mg/dL — ABNORMAL HIGH (ref 0.76–1.27)
GFR calc Af Amer: 66 mL/min/{1.73_m2} (ref >59)
GFR calc non Af Amer: 57 mL/min/{1.73_m2} — ABNORMAL LOW (ref >59)
Glucose: 207 mg/dL — ABNORMAL HIGH (ref 65–99)
Potassium: 4.3 mmol/L (ref 3.5–5.2)
Sodium: 142 mmol/L (ref 134–144)

## 2019-10-18 LAB — MAGNESIUM: Magnesium: 1.7 mg/dL (ref 1.6–2.3)

## 2019-10-18 LAB — TSH: TSH: 1.94 u[IU]/mL (ref 0.450–4.500)

## 2019-10-18 LAB — T4, FREE: Free T4: 1.22 ng/dL (ref 0.82–1.77)

## 2019-10-21 ENCOUNTER — Other Ambulatory Visit: Payer: Self-pay

## 2019-10-21 ENCOUNTER — Ambulatory Visit: Payer: Medicare Other | Admitting: Family

## 2019-10-21 ENCOUNTER — Telehealth: Payer: Self-pay | Admitting: *Deleted

## 2019-10-21 ENCOUNTER — Encounter: Payer: Self-pay | Admitting: Family

## 2019-10-21 VITALS — BP 97/54 | HR 68 | Resp 16 | Ht 72.0 in | Wt 202.2 lb

## 2019-10-21 DIAGNOSIS — J441 Chronic obstructive pulmonary disease with (acute) exacerbation: Secondary | ICD-10-CM | POA: Diagnosis not present

## 2019-10-21 DIAGNOSIS — Z888 Allergy status to other drugs, medicaments and biological substances status: Secondary | ICD-10-CM | POA: Insufficient documentation

## 2019-10-21 DIAGNOSIS — R4182 Altered mental status, unspecified: Secondary | ICD-10-CM | POA: Diagnosis not present

## 2019-10-21 DIAGNOSIS — J449 Chronic obstructive pulmonary disease, unspecified: Secondary | ICD-10-CM

## 2019-10-21 DIAGNOSIS — Z7952 Long term (current) use of systemic steroids: Secondary | ICD-10-CM | POA: Insufficient documentation

## 2019-10-21 DIAGNOSIS — Z7901 Long term (current) use of anticoagulants: Secondary | ICD-10-CM | POA: Insufficient documentation

## 2019-10-21 DIAGNOSIS — Z955 Presence of coronary angioplasty implant and graft: Secondary | ICD-10-CM | POA: Insufficient documentation

## 2019-10-21 DIAGNOSIS — Z79899 Other long term (current) drug therapy: Secondary | ICD-10-CM | POA: Insufficient documentation

## 2019-10-21 DIAGNOSIS — Z885 Allergy status to narcotic agent status: Secondary | ICD-10-CM | POA: Insufficient documentation

## 2019-10-21 DIAGNOSIS — I10 Essential (primary) hypertension: Secondary | ICD-10-CM

## 2019-10-21 DIAGNOSIS — G894 Chronic pain syndrome: Secondary | ICD-10-CM | POA: Insufficient documentation

## 2019-10-21 DIAGNOSIS — I130 Hypertensive heart and chronic kidney disease with heart failure and stage 1 through stage 4 chronic kidney disease, or unspecified chronic kidney disease: Secondary | ICD-10-CM | POA: Insufficient documentation

## 2019-10-21 DIAGNOSIS — Z886 Allergy status to analgesic agent status: Secondary | ICD-10-CM | POA: Insufficient documentation

## 2019-10-21 DIAGNOSIS — F329 Major depressive disorder, single episode, unspecified: Secondary | ICD-10-CM | POA: Insufficient documentation

## 2019-10-21 DIAGNOSIS — Z794 Long term (current) use of insulin: Secondary | ICD-10-CM

## 2019-10-21 DIAGNOSIS — E785 Hyperlipidemia, unspecified: Secondary | ICD-10-CM | POA: Insufficient documentation

## 2019-10-21 DIAGNOSIS — E1122 Type 2 diabetes mellitus with diabetic chronic kidney disease: Secondary | ICD-10-CM | POA: Insufficient documentation

## 2019-10-21 DIAGNOSIS — E1140 Type 2 diabetes mellitus with diabetic neuropathy, unspecified: Secondary | ICD-10-CM | POA: Insufficient documentation

## 2019-10-21 DIAGNOSIS — Z9049 Acquired absence of other specified parts of digestive tract: Secondary | ICD-10-CM | POA: Insufficient documentation

## 2019-10-21 DIAGNOSIS — I5032 Chronic diastolic (congestive) heart failure: Secondary | ICD-10-CM

## 2019-10-21 DIAGNOSIS — Z7982 Long term (current) use of aspirin: Secondary | ICD-10-CM | POA: Insufficient documentation

## 2019-10-21 DIAGNOSIS — K219 Gastro-esophageal reflux disease without esophagitis: Secondary | ICD-10-CM | POA: Insufficient documentation

## 2019-10-21 DIAGNOSIS — I2510 Atherosclerotic heart disease of native coronary artery without angina pectoris: Secondary | ICD-10-CM | POA: Insufficient documentation

## 2019-10-21 DIAGNOSIS — F17210 Nicotine dependence, cigarettes, uncomplicated: Secondary | ICD-10-CM | POA: Insufficient documentation

## 2019-10-21 DIAGNOSIS — N189 Chronic kidney disease, unspecified: Secondary | ICD-10-CM | POA: Insufficient documentation

## 2019-10-21 DIAGNOSIS — E119 Type 2 diabetes mellitus without complications: Secondary | ICD-10-CM

## 2019-10-21 NOTE — Progress Notes (Signed)
Patient ID: Gregory Hall, male    DOB: September 28, 1955, 64 y.o.   MRN: [id number]  Congestive Heart Failure Associated symptoms include fatigue (easily) and shortness of breath. Pertinent negatives include no abdominal pain, chest pain or palpitations.  Shortness of Breath Associated symptoms include leg swelling, neck pain and wheezing. Pertinent negatives include no abdominal pain, chest pain, headaches or sore throat.    Gregory Hall is a 64 y/o male with a history of CAD, DM, HTN, CKD, dyslipidemia, GERD, COPD, depression, current tobacco use and chronic heart failure.   Echo report from 09/26/19 reviewed and showed an EF of 55-60%.  Admitted to hospital 10/08/19 for abnormal EKG and shortness of breath and discharged 10/10/19.  Admitted 09/21/19 due to being found unresponsive at his group home. Was intubated and given vasopressor support. Cardiology, nephrology and palliative care consults obtained. Respiratory failure thought to be due to COPD exacerbation. Cardizem infusion started due to new onset AF with RVR. Needed temporary dialysis due to renal failure. Cardioverted on 09/26/19. Started on amiodarone. Discharged after 7 days. Admitted 09/12/19 due to acute on chronic hypoxia due to HF/COPD exacerbation. Initially needed bipap. Given IV lasix with transition to oral diuretics. Given steroids, antibiotics and nebulizer treatments. Discharged after 2 days.   He presents today for a follow-up visit with a chief complaint of leg swelling and fatigue. Describes fatigue as chronic in nature and reports his legs have been swelling but are more controlled following hospitalization. Patient has neuropathy bilaterally in his legs and feet.   Past Medical History:  Diagnosis Date   AKI (acute kidney injury) (HCC) 09/12/2019   CAD (coronary artery disease)    s/p PTCA and stent x2   Chest pain    CHF (congestive heart failure) (HCC)    Chronic pain syndrome    COPD (chronic obstructive  pulmonary disease) (HCC)    Degenerative cervical disc    Depression    Diabetes mellitus without complication (HCC)    Dyslipidemia    GERD (gastroesophageal reflux disease)    Hernia 2014   Hypertension    MRSA (methicillin resistant staph aureus) culture positive 2011   Neuropathy    Nutcracker esophagus    Rectus diastasis 07/19/2012   Past Surgical History:  Procedure Laterality Date   BACK SURGERY  2012   CARDIOVERSION N/A 09/26/2019   Procedure: CARDIOVERSION;  Surgeon: Antonieta Iba, MD;  Location: [facility] ORS;  Service: Cardiovascular;  Laterality: N/A;   CHOLECYSTECTOMY     COLONOSCOPY  Jan 2014   [name]   CORONARY ANGIOPLASTY WITH STENT PLACEMENT  2009   stents x2, in [place], [place]   FOOT SURGERY     Right   NECK SURGERY     SPINE SURGERY  2012,2013   TONSILLECTOMY     Family History  Family history unknown: Yes   Social History   Tobacco Use   Smoking status: Current Every Day Smoker    Packs/day: 0.50    Years: 30.00    Pack years: 15.00   Smokeless tobacco: Never Used  Substance Use Topics   Alcohol use: No   Allergies  Allergen Reactions   Acetaminophen Other (See Comments)    Reaction:  Unknown  Kidney failure Told not to take from home M.D. Related to kidney and renal failure    Fentanyl    Nsaids Other (See Comments)    Reaction:  Unknown  Kidney failure Patient states not to take from home M.D. Related to kidney  and renal failure.  Other reaction(s): Unknown   Prior to Admission medications   Medication Sig Start Date End Date Taking? Authorizing Provider  amiodarone (PACERONE) 200 MG tablet Take 200 mg by mouth daily.    Yes [provider]  aspirin EC 81 MG tablet Take 81 mg by mouth daily.   Yes [provider]  atorvastatin (LIPITOR) 40 MG tablet Take 1 tablet (40 mg total) by mouth daily. 09/28/19  Yes Lewie Chamber, MD  Cholecalciferol (VITAMIN D3) 50 MCG (2000 UT) TABS Take 2,000 Units  by mouth daily.    Yes [provider]  cyclobenzaprine (FLEXERIL) 5 MG tablet Take 1 tablet (5 mg total) by mouth 3 (three) times daily as needed. Patient taking differently: Take 5 mg by mouth 2 (two) times daily as needed for muscle spasms.  05/25/19  Yes Menshew, Charlesetta Ivory, PA-C  DULoxetine (CYMBALTA) 60 MG capsule Take 60 mg by mouth daily.   Yes [provider]  Ensure Max Protein (ENSURE MAX PROTEIN) LIQD Take 330 mLs (11 oz total) by mouth 2 (two) times daily between meals. 06/14/19  Yes Sheikh, Omair Latif, DO  furosemide (LASIX) 20 MG tablet Take 1 tablet (20 mg total) by mouth daily. Patient taking differently: Take 20 mg by mouth daily as needed for fluid.  09/14/19  Yes Danford, Earl Lites, MD  insulin glargine (LANTUS) 100 UNIT/ML injection Inject 22 Units into the skin at bedtime.    Yes [provider]  insulin lispro (HUMALOG) 100 UNIT/ML injection Inject 4 Units into the skin 3 (three) times daily before meals. Hold for FSBG <100   Yes [provider]  ipratropium-albuterol (DUONEB) 0.5-2.5 (3) MG/3ML SOLN Take 3 mLs by nebulization every 6 (six) hours as needed. Patient taking differently: Take 3 mLs by nebulization every 6 (six) hours as needed (wheezing and shortness of breath).  06/14/19  Yes Sheikh, Omair Latif, DO  lactulose (CHRONULAC) 10 GM/15ML solution Take 20 g by mouth daily as needed for mild constipation.   Yes [provider]  lamoTRIgine (LAMICTAL) 100 MG tablet Take 100 mg by mouth daily.   Yes [provider]  Menthol-Methyl Salicylate (SALONPAS PAIN RELIEF PATCH EX) Place 1 patch onto the skin every 12 (twelve) hours as needed (aches and pains).   Yes [provider]  metFORMIN (GLUCOPHAGE-XR) 750 MG 24 hr tablet Take 1 tablet (750 mg total) by mouth 2 (two) times daily. 09/16/19  Yes Danford, Earl Lites, MD  midodrine (PROAMATINE) 10 MG tablet Take 1 tablet (10 mg total) by mouth every 8 (eight)  hours. 09/28/19  Yes Lewie Chamber, MD  NAC 600 MG CAPS Take 600 mg by mouth daily. 09/08/19  Yes [provider]  NITROSTAT 0.4 MG SL tablet DISSOLVE (1) TABLET UNDER TONGUE AS NEEDED TO RELIEVE CHEST PAIN. MAYREPEAT EVERY 5 MINUTES. Patient taking differently: Place 0.4 mg under the tongue every 5 (five) minutes as needed for chest pain.  10/06/15  Yes Iran Ouch, MD  Oxycodone HCl 10 MG TABS Take 1 tablet (10 mg total) by mouth 3 (three) times daily. 09/14/19  Yes Danford, Earl Lites, MD  oxymetazoline (AFRIN) 0.05 % nasal spray Place 2 sprays into both nostrils 2 (two) times daily as needed for congestion.   Yes [provider]  pantoprazole (PROTONIX) 40 MG tablet Take 40 mg by mouth daily.    Yes [provider]  predniSONE (DELTASONE) 5 MG tablet Take 2.5 mg by mouth daily with  breakfast.   Yes [provider]  pregabalin (LYRICA) 200 MG capsule Take 1 capsule (200 mg total) by mouth 3 (three) times daily. 09/14/19  Yes Danford, Earl Lites, MD  rivaroxaban (XARELTO) 20 MG TABS tablet Take 20 mg by mouth daily with supper.   Yes [provider]  senna-docusate (SENOKOT-S) 8.6-50 MG tablet Take 1 tablet by mouth daily.    Yes [provider]  traZODone (DESYREL) 100 MG tablet Take 100 mg by mouth at bedtime. 09/08/19  Yes [provider]  nicotine (NICODERM CQ - DOSED IN MG/24 HOURS) 21 mg/24hr patch Place onto the skin. Patient not taking: Reported on 10/21/2019    [provider]  tamsulosin (FLOMAX) 0.4 MG CAPS capsule Take 1 capsule (0.4 mg total) by mouth daily after supper. Patient not taking: Reported on 10/21/2019 10/10/19   Lynn Ito, MD  tiotropium (SPIRIVA) 18 MCG inhalation capsule Place into inhaler and inhale. Patient not taking: Reported on 10/21/2019    [provider]    Review of Systems  Constitutional: Positive for fatigue (easily). Negative for appetite change.  HENT: Negative for  congestion, postnasal drip and sore throat.   Eyes: Negative.   Respiratory: Positive for cough (dry cough), shortness of breath and wheezing. Negative for choking and chest tightness.   Cardiovascular: Positive for leg swelling. Negative for chest pain and palpitations.  Gastrointestinal: Negative for abdominal distention and abdominal pain.  Endocrine: Negative.   Genitourinary: Negative.   Musculoskeletal: Positive for back pain and neck pain.  Skin: Negative.   Allergic/Immunologic: Negative.   Neurological: Negative for dizziness, weakness, light-headedness and headaches.  Hematological: Negative for adenopathy. Bruises/bleeds easily.  Psychiatric/Behavioral: Positive for sleep disturbance (difficulty sleeping due to pain). Negative for dysphoric mood. The patient is not nervous/anxious.    Vitals:   10/21/19 1137  BP: (!) 97/54  Pulse: 68  Resp: 16  SpO2: 90%  Weight: 202 lb 3 oz (91.7 kg)  Height: 6' (1.829 m)   Wt Readings from Last 3 Encounters:  10/21/19 202 lb 3 oz (91.7 kg)  10/17/19 201 lb 8 oz (91.4 kg)  10/10/19 200 lb 1.6 oz (90.8 kg)   Lab Results  Component Value Date   CREATININE 1.30 (H) 10/17/2019   CREATININE 1.13 10/10/2019   CREATININE 1.12 10/09/2019    Physical Exam Vitals and nursing note reviewed.  Constitutional:      Appearance: Normal appearance.  HENT:     Head: Normocephalic and atraumatic.  Cardiovascular:     Rate and Rhythm: Normal rate and regular rhythm.  Pulmonary:     Effort: Pulmonary effort is normal. No respiratory distress.     Breath sounds: Examination of the right-lower field reveals rales. Examination of the left-lower field reveals rales. Rales present. No wheezing.  Abdominal:     General: There is no distension.     Palpations: Abdomen is soft.     Tenderness: There is no abdominal tenderness.  Musculoskeletal:        General: No tenderness.     Cervical back: Normal range of motion and neck supple.     Right  lower leg: Edema (3+ pitting) present.     Left lower leg: Edema (3+ pitting) present.  Skin:    General: Skin is warm and dry.     Findings: Bruising (Bruising noted to bilateral arms, patient reports he was stuck many times while hospitalized) present.  Neurological:     General: No focal deficit present.  Mental Status: He is alert and oriented to person, place, and time.  Psychiatric:        Mood and Affect: Mood normal.        Behavior: Behavior normal.     Assessment & Plan:  1: Chronic heart failure with preserved ejection fraction without structural changes- - NYHA class III - Fluid overload noted today in bilateral legs, crackles audible in both lung bases - getting weighed daily and he says that his weight has been stable; reminded to call or have staff call for an overnight weight gain of >2 pounds or a weekly weight gain of >5 pounds - says that he only adds salt to french fries which is 1-2 times/ month - group home has order already in place for furosemide 20mg  daily as needed although patient says that he's taken 80mg  daily the last 2 days - Reports compression socks were ordered yesterday - BNP 09/21/19 was 247.5 - reports receiving both covid vaccines as well as Flu vaccine and Pneumonia shot  2: HTN- - BP low but he says his runs low and he's currently without dizziness; already taking midodrine 10mg  TID; could be chronically low due to narcotic use - BMP 10/17/19 reviewed and showed sodium 142, potassium 4.3, creatinine 1.30 and GFR  57  3: DM- - A1c 09/13/19 was 7.0% - facility glucose was 131  4: COPD- - saw pulmonology Karna Christmas) 08/26/19   Group home medication list was reviewed.  Return in 3 months or sooner for any questions/problems before then.

## 2019-10-21 NOTE — Telephone Encounter (Signed)
-----   Message from Lennon Alstrom, PA-C sent at 10/19/2019  7:32 PM EDT ----- Renal function within his normal window, though on the higher side of his nl baseline window. Electrolytes within their normal ranges. Magnesium is on the lower side, which we will watch closely.  If SBP consistently above 100-110 at R arm, OK to restart his daily lasix. Make sure to take BP in R arm only. Please continue to hold Bisoprolol unless HR is consistently above 110bpm.  Please follow these instructions:  --Low salt under 2g daily; Fluid under 2L daily. --Daily BP in right arm (not L arm).  --Daily AM weights. Take 1 additional lasix if 3lb weight gain overnight or 5lb within a few days. If this occurs more than 3 days in a row without improvement or without increased urine output, call the office. Hold lasix if wt decrease below baseline.  Let me know if questions.

## 2019-10-21 NOTE — Telephone Encounter (Signed)
No answer. Left message to call back.   

## 2019-10-22 ENCOUNTER — Inpatient Hospital Stay
Admission: EM | Admit: 2019-10-22 | Discharge: 2019-10-27 | DRG: 208 | Disposition: A | Discharge status: Skilled Nursing Facility | Payer: Medicare Other | Attending: Obstetrics and Gynecology | Admitting: Obstetrics and Gynecology

## 2019-10-22 ENCOUNTER — Emergency Department: Payer: Medicare Other

## 2019-10-22 ENCOUNTER — Inpatient Hospital Stay: Payer: Medicare Other

## 2019-10-22 ENCOUNTER — Encounter: Payer: Self-pay | Admitting: Emergency Medicine

## 2019-10-22 DIAGNOSIS — R4182 Altered mental status, unspecified: Secondary | ICD-10-CM | POA: Diagnosis present

## 2019-10-22 DIAGNOSIS — F17210 Nicotine dependence, cigarettes, uncomplicated: Secondary | ICD-10-CM | POA: Diagnosis present

## 2019-10-22 DIAGNOSIS — G9341 Metabolic encephalopathy: Secondary | ICD-10-CM | POA: Diagnosis present

## 2019-10-22 DIAGNOSIS — Z7952 Long term (current) use of systemic steroids: Secondary | ICD-10-CM

## 2019-10-22 DIAGNOSIS — Z9981 Dependence on supplemental oxygen: Secondary | ICD-10-CM

## 2019-10-22 DIAGNOSIS — K219 Gastro-esophageal reflux disease without esophagitis: Secondary | ICD-10-CM | POA: Diagnosis present

## 2019-10-22 DIAGNOSIS — Z7901 Long term (current) use of anticoagulants: Secondary | ICD-10-CM | POA: Diagnosis not present

## 2019-10-22 DIAGNOSIS — J9622 Acute and chronic respiratory failure with hypercapnia: Secondary | ICD-10-CM | POA: Diagnosis present

## 2019-10-22 DIAGNOSIS — Z9049 Acquired absence of other specified parts of digestive tract: Secondary | ICD-10-CM

## 2019-10-22 DIAGNOSIS — J9621 Acute and chronic respiratory failure with hypoxia: Secondary | ICD-10-CM | POA: Diagnosis present

## 2019-10-22 DIAGNOSIS — I110 Hypertensive heart disease with heart failure: Secondary | ICD-10-CM | POA: Diagnosis present

## 2019-10-22 DIAGNOSIS — Z886 Allergy status to analgesic agent status: Secondary | ICD-10-CM

## 2019-10-22 DIAGNOSIS — Z7189 Other specified counseling: Secondary | ICD-10-CM | POA: Diagnosis not present

## 2019-10-22 DIAGNOSIS — I5033 Acute on chronic diastolic (congestive) heart failure: Secondary | ICD-10-CM | POA: Diagnosis present

## 2019-10-22 DIAGNOSIS — D649 Anemia, unspecified: Secondary | ICD-10-CM | POA: Diagnosis present

## 2019-10-22 DIAGNOSIS — E1140 Type 2 diabetes mellitus with diabetic neuropathy, unspecified: Secondary | ICD-10-CM | POA: Diagnosis present

## 2019-10-22 DIAGNOSIS — E860 Dehydration: Secondary | ICD-10-CM | POA: Diagnosis present

## 2019-10-22 DIAGNOSIS — Z888 Allergy status to other drugs, medicaments and biological substances status: Secondary | ICD-10-CM

## 2019-10-22 DIAGNOSIS — N179 Acute kidney failure, unspecified: Secondary | ICD-10-CM | POA: Diagnosis present

## 2019-10-22 DIAGNOSIS — G894 Chronic pain syndrome: Secondary | ICD-10-CM | POA: Diagnosis present

## 2019-10-22 DIAGNOSIS — I4891 Unspecified atrial fibrillation: Secondary | ICD-10-CM | POA: Diagnosis present

## 2019-10-22 DIAGNOSIS — Z20822 Contact with and (suspected) exposure to covid-19: Secondary | ICD-10-CM | POA: Diagnosis present

## 2019-10-22 DIAGNOSIS — Z66 Do not resuscitate: Secondary | ICD-10-CM | POA: Diagnosis present

## 2019-10-22 DIAGNOSIS — E875 Hyperkalemia: Secondary | ICD-10-CM | POA: Diagnosis present

## 2019-10-22 DIAGNOSIS — J441 Chronic obstructive pulmonary disease with (acute) exacerbation: Secondary | ICD-10-CM | POA: Diagnosis present

## 2019-10-22 DIAGNOSIS — E785 Hyperlipidemia, unspecified: Secondary | ICD-10-CM | POA: Diagnosis present

## 2019-10-22 DIAGNOSIS — Z7984 Long term (current) use of oral hypoglycemic drugs: Secondary | ICD-10-CM

## 2019-10-22 DIAGNOSIS — Z515 Encounter for palliative care: Secondary | ICD-10-CM | POA: Diagnosis not present

## 2019-10-22 DIAGNOSIS — J9601 Acute respiratory failure with hypoxia: Secondary | ICD-10-CM

## 2019-10-22 DIAGNOSIS — I2510 Atherosclerotic heart disease of native coronary artery without angina pectoris: Secondary | ICD-10-CM | POA: Diagnosis present

## 2019-10-22 DIAGNOSIS — Z79899 Other long term (current) drug therapy: Secondary | ICD-10-CM

## 2019-10-22 DIAGNOSIS — Z978 Presence of other specified devices: Secondary | ICD-10-CM

## 2019-10-22 DIAGNOSIS — E1165 Type 2 diabetes mellitus with hyperglycemia: Secondary | ICD-10-CM | POA: Diagnosis present

## 2019-10-22 DIAGNOSIS — Z7982 Long term (current) use of aspirin: Secondary | ICD-10-CM

## 2019-10-22 DIAGNOSIS — Z794 Long term (current) use of insulin: Secondary | ICD-10-CM

## 2019-10-22 LAB — TROPONIN I (HIGH SENSITIVITY): Troponin I (High Sensitivity): 9 ng/L (ref <18)

## 2019-10-22 LAB — URINALYSIS, COMPLETE (UACMP) WITH MICROSCOPIC
Bacteria, UA: NONE SEEN
Bilirubin Urine: NEGATIVE
Glucose, UA: NEGATIVE mg/dL
Hgb urine dipstick: NEGATIVE
Ketones, ur: NEGATIVE mg/dL
Leukocytes,Ua: NEGATIVE
Nitrite: NEGATIVE
Protein, ur: NEGATIVE mg/dL
Specific Gravity, Urine: 1.009 (ref 1.005–1.030)
WBC, UA: NONE SEEN WBC/hpf (ref 0–5)
pH: 5 (ref 5.0–8.0)

## 2019-10-22 LAB — BLOOD GAS, ARTERIAL
Acid-Base Excess: 18.2 mmol/L — ABNORMAL HIGH (ref 0.0–2.0)
Bicarbonate: 45.5 mmol/L — ABNORMAL HIGH (ref 20.0–28.0)
FIO2: 0.4
MECHVT: 500 mL
Mechanical Rate: 24
O2 Saturation: 91.5 %
PEEP: 5 cmH2O
Patient temperature: 37
pCO2 arterial: 67 mmHg (ref 32.0–48.0)
pH, Arterial: 7.44 (ref 7.350–7.450)
pO2, Arterial: 60 mmHg — ABNORMAL LOW (ref 83.0–108.0)

## 2019-10-22 LAB — COMPREHENSIVE METABOLIC PANEL
ALT: 8 U/L (ref 0–44)
AST: 17 U/L (ref 15–41)
Albumin: 3.8 g/dL (ref 3.5–5.0)
Alkaline Phosphatase: 109 U/L (ref 38–126)
Anion gap: 13 (ref 5–15)
BUN: 10 mg/dL (ref 8–23)
CO2: 33 mmol/L — ABNORMAL HIGH (ref 22–32)
Calcium: 8.6 mg/dL — ABNORMAL LOW (ref 8.9–10.3)
Chloride: 96 mmol/L — ABNORMAL LOW (ref 98–111)
Creatinine, Ser: 1.21 mg/dL (ref 0.61–1.24)
GFR, Estimated: >60 mL/min (ref >60)
Glucose, Bld: 125 mg/dL — ABNORMAL HIGH (ref 70–99)
Potassium: 5.3 mmol/L — ABNORMAL HIGH (ref 3.5–5.1)
Sodium: 142 mmol/L (ref 135–145)
Total Bilirubin: 0.7 mg/dL (ref 0.3–1.2)
Total Protein: 7.2 g/dL (ref 6.5–8.1)

## 2019-10-22 LAB — BRAIN NATRIURETIC PEPTIDE: B Natriuretic Peptide: 198.3 pg/mL — ABNORMAL HIGH (ref 0.0–100.0)

## 2019-10-22 LAB — BLOOD GAS, VENOUS
Acid-Base Excess: 11 mmol/L — ABNORMAL HIGH (ref 0.0–2.0)
Acid-Base Excess: 14.4 mmol/L — ABNORMAL HIGH (ref 0.0–2.0)
Bicarbonate: 42.8 mmol/L — ABNORMAL HIGH (ref 20.0–28.0)
Bicarbonate: 45.8 mmol/L — ABNORMAL HIGH (ref 20.0–28.0)
Delivery systems: POSITIVE
FIO2: 0.4
O2 Saturation: 54 %
O2 Saturation: 46.1 %
Patient temperature: 37
Patient temperature: 37
pCO2, Ven: 112 mmHg (ref 44.0–60.0)
pCO2, Ven: 112 mmHg (ref 44.0–60.0)
pH, Ven: 7.19 — CL (ref 7.250–7.430)
pH, Ven: 7.22 — ABNORMAL LOW (ref 7.250–7.430)
pO2, Ven: 36 mmHg (ref 32.0–45.0)
pO2, Ven: 31 mmHg — CL (ref 32.0–45.0)

## 2019-10-22 LAB — CBC WITH DIFFERENTIAL/PLATELET
Abs Immature Granulocytes: 0.12 10*3/uL — ABNORMAL HIGH (ref 0.00–0.07)
Basophils Absolute: 0.1 10*3/uL (ref 0.0–0.1)
Basophils Relative: 1 %
Eosinophils Absolute: 0.1 10*3/uL (ref 0.0–0.5)
Eosinophils Relative: 1 %
HCT: 39.3 % (ref 39.0–52.0)
Hemoglobin: 11.5 g/dL — ABNORMAL LOW (ref 13.0–17.0)
Immature Granulocytes: 2 %
Lymphocytes Relative: 15 %
Lymphs Abs: 1.2 10*3/uL (ref 0.7–4.0)
MCH: 27 pg (ref 26.0–34.0)
MCHC: 29.3 g/dL — ABNORMAL LOW (ref 30.0–36.0)
MCV: 92.3 fL (ref 80.0–100.0)
Monocytes Absolute: 0.9 10*3/uL (ref 0.1–1.0)
Monocytes Relative: 11 %
Neutro Abs: 5.6 10*3/uL (ref 1.7–7.7)
Neutrophils Relative %: 70 %
Platelets: 220 10*3/uL (ref 150–400)
RBC: 4.26 MIL/uL (ref 4.22–5.81)
RDW: 17.1 % — ABNORMAL HIGH (ref 11.5–15.5)
WBC: 7.9 10*3/uL (ref 4.0–10.5)
nRBC: 0 % (ref 0.0–0.2)

## 2019-10-22 LAB — URINE DRUG SCREEN, QUALITATIVE (ARMC ONLY)
Amphetamines, Ur Screen: NOT DETECTED
Barbiturates, Ur Screen: NOT DETECTED
Benzodiazepine, Ur Scrn: NOT DETECTED
Cannabinoid 50 Ng, Ur ~~LOC~~: NOT DETECTED
Cocaine Metabolite,Ur ~~LOC~~: NOT DETECTED
MDMA (Ecstasy)Ur Screen: NOT DETECTED
Methadone Scn, Ur: NOT DETECTED
Opiate, Ur Screen: NOT DETECTED
Phencyclidine (PCP) Ur S: NOT DETECTED
Tricyclic, Ur Screen: NOT DETECTED

## 2019-10-22 LAB — RESPIRATORY PANEL BY RT PCR (FLU A&B, COVID)
Influenza A by PCR: NEGATIVE
Influenza B by PCR: NEGATIVE
SARS Coronavirus 2 by RT PCR: NEGATIVE

## 2019-10-22 LAB — ETHANOL: Alcohol, Ethyl (B): <10 mg/dL (ref <10)

## 2019-10-22 MED ORDER — CALCIUM GLUCONATE-NACL 1-0.675 GM/50ML-% IV SOLN
1.0000 g | Freq: Once | INTRAVENOUS | Status: AC
  Administered 2019-10-23: 1000 mg via INTRAVENOUS
  Filled 2019-10-22: qty 50

## 2019-10-22 MED ORDER — SODIUM CHLORIDE 0.9 % IV BOLUS
1000.0000 mL | Freq: Once | INTRAVENOUS | Status: AC
  Administered 2019-10-23: 1000 mL via INTRAVENOUS

## 2019-10-22 MED ORDER — POLYETHYLENE GLYCOL 3350 17 G PO PACK: 17.0000 g | PACK | Freq: Every day | ORAL | Status: DC

## 2019-10-22 MED ORDER — PROPOFOL 1000 MG/100ML IV EMUL
0.0000 ug/kg/min | INTRAVENOUS | Status: DC
  Administered 2019-10-23 – 2019-10-24 (×6): 50 ug/kg/min via INTRAVENOUS
  Administered 2019-10-24 (×2): 45 ug/kg/min via INTRAVENOUS
  Administered 2019-10-24: 35 ug/kg/min via INTRAVENOUS
  Administered 2019-10-25: 20 ug/kg/min via INTRAVENOUS
  Administered 2019-10-25 (×2): 45 ug/kg/min via INTRAVENOUS
  Filled 2019-10-22 (×14): qty 100

## 2019-10-22 MED ORDER — BUDESONIDE 0.5 MG/2ML IN SUSP
0.5000 mg | Freq: Every day | RESPIRATORY_TRACT | Status: DC
  Administered 2019-10-23 – 2019-10-26 (×4): 0.5 mg via RESPIRATORY_TRACT
  Filled 2019-10-22 (×5): qty 2

## 2019-10-22 MED ORDER — PANTOPRAZOLE SODIUM 40 MG IV SOLR
40.0000 mg | Freq: Every day | INTRAVENOUS | Status: DC
  Administered 2019-10-23 – 2019-10-26 (×5): 40 mg via INTRAVENOUS
  Filled 2019-10-22 (×5): qty 40

## 2019-10-22 MED ORDER — METHYLPREDNISOLONE SODIUM SUCC 125 MG IJ SOLR
125.0000 mg | Freq: Once | INTRAMUSCULAR | Status: AC
  Administered 2019-10-22: 125 mg via INTRAVENOUS
  Filled 2019-10-22: qty 2

## 2019-10-22 MED ORDER — HYDROMORPHONE HCL 1 MG/ML IJ SOLN: 0.5000 mg | INTRAMUSCULAR | Status: DC | PRN

## 2019-10-22 MED ORDER — DOCUSATE SODIUM 100 MG PO CAPS: 100.0000 mg | ORAL_CAPSULE | Freq: Two times a day (BID) | ORAL | Status: DC | PRN

## 2019-10-22 MED ORDER — METHYLPREDNISOLONE SODIUM SUCC 125 MG IJ SOLR
60.0000 mg | Freq: Two times a day (BID) | INTRAMUSCULAR | Status: DC
  Administered 2019-10-23: 60 mg via INTRAVENOUS
  Filled 2019-10-22: qty 2

## 2019-10-22 MED ORDER — ETOMIDATE 2 MG/ML IV SOLN
30.0000 mg | Freq: Once | INTRAVENOUS | Status: AC
  Administered 2019-10-22: 30 mg via INTRAVENOUS

## 2019-10-22 MED ORDER — IPRATROPIUM-ALBUTEROL 0.5-2.5 (3) MG/3ML IN SOLN: 3.0000 mL | RESPIRATORY_TRACT | Status: DC | PRN

## 2019-10-22 MED ORDER — DOCUSATE SODIUM 50 MG/5ML PO LIQD
100.0000 mg | Freq: Two times a day (BID) | ORAL | Status: DC
  Administered 2019-10-23: 100 mg via ORAL
  Filled 2019-10-22: qty 10

## 2019-10-22 MED ORDER — PROPOFOL 1000 MG/100ML IV EMUL
INTRAVENOUS | Status: AC
  Administered 2019-10-22: 5 ug/kg/min via INTRAVENOUS
  Filled 2019-10-22: qty 100

## 2019-10-22 MED ORDER — ENOXAPARIN SODIUM 40 MG/0.4ML ~~LOC~~ SOLN: 40.0000 mg | SUBCUTANEOUS | Status: DC

## 2019-10-22 MED ORDER — POLYETHYLENE GLYCOL 3350 17 G PO PACK: 17.0000 g | PACK | Freq: Every day | ORAL | Status: DC | PRN

## 2019-10-22 MED ORDER — IPRATROPIUM-ALBUTEROL 0.5-2.5 (3) MG/3ML IN SOLN
3.0000 mL | Freq: Four times a day (QID) | RESPIRATORY_TRACT | Status: DC
  Administered 2019-10-23 – 2019-10-26 (×15): 3 mL via RESPIRATORY_TRACT
  Filled 2019-10-22 (×15): qty 3

## 2019-10-22 MED ORDER — FAMOTIDINE IN NACL 20-0.9 MG/50ML-% IV SOLN: 20.0000 mg | Freq: Two times a day (BID) | INTRAVENOUS | Status: DC

## 2019-10-22 MED ORDER — INSULIN ASPART 100 UNIT/ML ~~LOC~~ SOLN
0.0000 [IU] | SUBCUTANEOUS | Status: DC
  Administered 2019-10-23 (×3): 2 [IU] via SUBCUTANEOUS
  Administered 2019-10-23: 5 [IU] via SUBCUTANEOUS
  Administered 2019-10-23: 2 [IU] via SUBCUTANEOUS
  Administered 2019-10-23 – 2019-10-24 (×4): 3 [IU] via SUBCUTANEOUS
  Administered 2019-10-24: 5 [IU] via SUBCUTANEOUS
  Administered 2019-10-24: 3 [IU] via SUBCUTANEOUS
  Administered 2019-10-24 (×2): 5 [IU] via SUBCUTANEOUS
  Administered 2019-10-25: 8 [IU] via SUBCUTANEOUS
  Administered 2019-10-25: 3 [IU] via SUBCUTANEOUS
  Administered 2019-10-25: 5 [IU] via SUBCUTANEOUS
  Administered 2019-10-25: 3 [IU] via SUBCUTANEOUS
  Administered 2019-10-25: 5 [IU] via SUBCUTANEOUS
  Administered 2019-10-25 – 2019-10-26 (×4): 3 [IU] via SUBCUTANEOUS
  Administered 2019-10-26: 5 [IU] via SUBCUTANEOUS
  Administered 2019-10-26: 1 [IU] via SUBCUTANEOUS
  Administered 2019-10-27 (×2): 8 [IU] via SUBCUTANEOUS
  Filled 2019-10-22 (×25): qty 1

## 2019-10-22 MED ORDER — ROCURONIUM BROMIDE 50 MG/5ML IV SOLN
100.0000 mg | Freq: Once | INTRAVENOUS | Status: AC
  Administered 2019-10-22: 100 mg via INTRAVENOUS

## 2019-10-22 MED ORDER — IPRATROPIUM-ALBUTEROL 0.5-2.5 (3) MG/3ML IN SOLN
9.0000 mL | Freq: Once | RESPIRATORY_TRACT | Status: AC
  Administered 2019-10-22: 9 mL via RESPIRATORY_TRACT
  Filled 2019-10-22: qty 9

## 2019-10-22 MED ORDER — METHYLPREDNISOLONE SODIUM SUCC 125 MG IJ SOLR: 60.0000 mg | Freq: Two times a day (BID) | INTRAMUSCULAR | Status: DC

## 2019-10-22 NOTE — Telephone Encounter (Signed)
Called to give the patient results. [email or web address]. 

## 2019-10-22 NOTE — H&P (Addendum)
NAME:  Gregory Hall, MRN:  [id number], DOB:  27-Oct-1955, LOS: 0 ADMISSION DATE:  10/22/2019, CONSULTATION DATE:  10/22/2019 REFERRING MD:  Dr. Charna Archer, CHIEF COMPLAINT:  Altered Mental Status   Brief History   64 year old male admitted with acute metabolic encephalopathy and acute on chronic hypoxic hypercapnic respiratory failure secondary to COPD Exacerbation.  Failed trial of BiPAP requiring intubation in the ED.  History of present illness   Gregory Hall is a 64 year old male with a past medical history significant for COPD on 3 L home O2, atrial fibrillation on Xarelto, hypertension, hyperlipidemia, CAD, HFpEF, CKD, diabetes mellitus, and chronic pain who presents to [facility] ED on 10/22/2019 from his group home due to complaints of altered mental status.  Patient is currently intubated and sedated and no family is present, therefore history is obtained from ED and nursing notes.  Per notes, group home staff noticed he was progressively confused throughout the day today.  He seemed to worsen after receiving his evening medications (on narcotics for his chronic pain) so EMS was called.  EMS gave a dose of Narcan without significant response.    Upon arrival to the ED he was noted to be quite somnolent but arousable to voice, complaining of pain in his legs but otherwise denies complaints.  Vital signs were reassuring, but he was noted to have wheezing on exam.  Initial work-up in the ED revealed potassium 5.3, bicarb 33, glucose 125, BUN 10, creatinine 1.21, high-sensitivity troponin 9, WBC 7.9, hemoglobin 11.5.  Venous blood gas with pH 7.22 /PCO2 112 /PO2 31/bicarb 45.8.  Given his respiratory acidosis and somnolence he was placed on BiPAP.  He was given duo nebs and IV Solu-Medrol 125 mg.  His SARS-CoV-2 PCR is negative, influenza PCR is negative.  Chest x-ray showed stable chronic lung disease, with no acute superimposed findings.  Urinalysis is negative for UTI, and urine drug screen is  negative.  Serum ethyl alcohol is less than 10.  Follow-up ABG post BiPAP is virtually unchanged and he became more somnolent, which he was subsequently intubated by ED provider.  CT head is negative for any acute intracranial process.  PCCM is asked to admit the patient for further work-up and treatment of acute metabolic encephalopathy and acute on chronic hypoxic hypercapnic respiratory failure in the setting of COPD exacerbation requiring intubation.  Past Medical History  COPD on chronic 3L O2 Atrial Fibrillation on Xarelto HFpEF CAD Chronic pain syndrome  Hypertension Hyperlipidemia Diabetes Mellitus GERD Neuropathy  Significant Hospital Events   10/13: Presented to ED due to altered mental status, required intubation due to worsening somnolence and unchanged ABG  Consults:  PCCM  Procedures:  10/13: Endotracheal intubation  Significant Diagnostic Tests:  10/13: Chest x-ray>>Portable AP upright view at 2050 hours. Stable lung volumes, mediastinal contours and thoracic spinal stimulator. Chronic lower lobe atelectasis and/or scarring. Chronic increased interstitial opacity in both lungs. Lung markings today appear at baseline. No pneumothorax or pleural effusion. Visualized tracheal air column is within normal limits. Partially visible cervical ACDF. No acute osseous abnormality identified. 10/13: CT head>>1. No acute intracranial pathology. 2. Paranasal sinus disease.  Micro Data:  10/13: SARS-CoV-2>> negative 10/13: Influenza>> negative 10/13: Respiratory Viral Panel>> 10/14: Tracheal aspirate>>  Antimicrobials:  N/A  Interim history/subjective:  Unable to assess due to intubation and sedation  Objective   Blood pressure (!) 159/90, pulse (!) 109, resp. rate (!) 24, SpO2 100 %.       No intake or output data in  the 24 hours ending 10/22/19 2312 There were no vitals filed for this visit.  Examination: General: Acute on chronically ill appearing male,  laying in bed, intubated and sedated, in NAD HENT: Atraumatic, normocephalic, neck supple, no JVD, pupils PERRLA. Moist mucus membranes, ETT in place Lungs: Coarse breath sounds bilaterally with diffuse inspiratory and expiratory wheezing, vent assisted, even, no accessory muscle use Cardiovascular: Regular rate and rhythm, S1-S2, no murmurs, rubs, gallops, 1+ distal pulses Abdomen: Obese, soft, nontender, nondistended, no guarding or rebound tenderness, bowel sounds positive x4 Extremities: No deformities, 2+ edema to bilateral lower extremities Neuro: Sedated, withdraws from pain, pupils PERRLA Skin: Warm and dry. Scattered ecchymosis to Bilateral UE.  No obvious rashes, lesions, or ulcerations    Assessment & Plan:   Acute on Chronic Hypoxic Hypercapnic Respiratory Failure secondary to COPD Exacerbation Hx: COPD on 3L home O2 -Failed trial of BiPAP -Full vent support -Wean FiO2 and PEEP as tolerated to maintain O2 sats 88 to 92% -Follow intermittent chest x-ray and ABG as needed -CXR without evidence of Pneumonia 10/22/19 -Will check Respiratory Viral Panel and Tracheal aspirate culture -VAP protocol -Spontaneous breathing trials when respiratory parameters met and mental status permits -Scheduled Bronchodilators -IV and nebulized steroids   Acute Metabolic Encephalopathy secondary to Hypercapnia Sedation needs in setting of Mechanical Ventilation -RASS goal of 0 to -1 -Propofol drip and as needed Dilaudid to maintain RASS goal -Daily wake-up assessment -CT head negative 10/22/19 -Urine drug screen is negative -Patient had no response to Narcan -Provide supportive care   Chronic HFpEF without acute exacerbation Hx: Atrial flutter s/p DDCV on 09/26/19, HTN, HLD, CAD, Left Subclavian artery stenosis -Continuous cardiac monitoring (currently in NSR) -Maintain MAP >65 (BP should be taken in right arm due to Left Subclavian artery stenosis) -Diuresis as BP and renal function  permits -Outpatient Amiodarone was d/c by his PCP -Will continue home Xarelto (pharmacy to dose) -Echo on 09/26/19 with EF 55-60%   AKI Mild Hyperkalemia -Monitor I&O's / urinary output -Follow BMP -Ensure adequate renal perfusion -Avoid nephrotoxic agents as able -Replace electrolytes as indicated -Gentle IVF   Anemia without s/sx of Bleeding -Monitor for S/Sx of bleeding -Trend CBC -Continue home Xarelto for Anticoagulation/VTE Prophylaxis  -Transfuse for Hgb <7   Diabetes Mellitus -CBG's -SSI -Follow ICU Hypo/hyperglycemia protocol    Best practice:  Diet: NPO Pain/Anxiety/Delirium protocol (if indicated): Prn Dilaudid VAP protocol (if indicated): yes DVT prophylaxis: Lovenox GI prophylaxis: Protonix Glucose control: SSI Mobility: N/A Code Status: Full code Family Communication: No family present during NP rounds Disposition: ICU  Labs   CBC: Recent Labs  Lab 10/17/19 1013 10/22/19 2037  WBC 6.5 7.9  NEUTROABS  --  5.6  HGB 10.4* 11.5*  HCT 33.5* 39.3  MCV 87 92.3  PLT 280 030    Basic Metabolic Panel: Recent Labs  Lab 10/17/19 1013 10/22/19 2037  NA 142 142  K 4.3 5.3*  CL 100 96*  CO2 26 33*  GLUCOSE 207* 125*  BUN 11 10  CREATININE 1.30* 1.21  CALCIUM 8.7 8.6*  MG 1.7  --    GFR: Estimated Creatinine Clearance: 65.9 mL/min (by C-G formula based on SCr of 1.21 mg/dL). Recent Labs  Lab 10/17/19 1013 10/22/19 2037  WBC 6.5 7.9    Liver Function Tests: Recent Labs  Lab 10/22/19 2037  AST 17  ALT 8  ALKPHOS 109  BILITOT 0.7  PROT 7.2  ALBUMIN 3.8   No results for input(s): LIPASE, AMYLASE in the  last 168 hours. No results for input(s): AMMONIA in the last 168 hours.  ABG    Component Value Date/Time   PHART 7.38 09/22/2019 2100   PCO2ART 54 (H) 09/22/2019 2100   PO2ART 72 (L) 09/22/2019 2100   HCO3 45.8 (H) 10/22/2019 2213   TCO2 26 04/14/2009 0014   O2SAT 46.1 10/22/2019 2213     Coagulation Profile: No results  for input(s): INR, PROTIME in the last 168 hours.  Cardiac Enzymes: No results for input(s): CKTOTAL, CKMB, CKMBINDEX, TROPONINI in the last 168 hours.  HbA1C: Hemoglobin A1C  Date/Time Value Ref Range Status  08/24/2011 01:10 AM 8.3 (H) 4.2 - 6.3 % Final    Comment:    The American Diabetes Association recommends that a primary goal of therapy should be <7% and that physicians should reevaluate the treatment regimen in patients with HbA1c values consistently >8%.   07/15/2011 05:03 AM 8.1 (H) 4.2 - 6.3 % Final    Comment:    The American Diabetes Association recommends that a primary goal of therapy should be <7% and that physicians should reevaluate the treatment regimen in patients with HbA1c values consistently >8%.    Hgb A1c MFr Bld  Date/Time Value Ref Range Status  09/13/2019 06:46 AM 7.0 (H) 4.8 - 5.6 % Final    Comment:    (NOTE) Pre diabetes:          5.7%-6.4%  Diabetes:              >6.4%  Glycemic control for   <7.0% adults with diabetes   06/10/2019 07:14 AM 7.9 (H) 4.8 - 5.6 % Final    Comment:    (NOTE) Pre diabetes:          5.7%-6.4% Diabetes:              >6.4% Glycemic control for   <7.0% adults with diabetes     CBG: No results for input(s): GLUCAP in the last 168 hours.  Review of Systems:   Unable to assess due to intubation and sedation  Past Medical History  He,  has a past medical history of AKI (acute kidney injury) ([place]) (09/12/2019), CAD (coronary artery disease), Chest pain, CHF (congestive heart failure) ([place]), Chronic pain syndrome, COPD (chronic obstructive pulmonary disease) ([place]), Degenerative cervical disc, Depression, Diabetes mellitus without complication ([place]), Dyslipidemia, GERD (gastroesophageal reflux disease), Hernia (2014), Hypertension, MRSA (methicillin resistant staph aureus) culture positive (2011), Neuropathy, [place]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   Surgical History    Past Surgical History:   Procedure Laterality Date  . BACK SURGERY  2012  . CARDIOVERSION N/A 09/26/2019   Procedure: CARDIOVERSION;  Surgeon: Minna Merritts, MD;  Location: [facility] ORS;  Service: Cardiovascular;  Laterality: N/A;  . CHOLECYSTECTOMY    . COLONOSCOPY  Jan 2014   [name]  . CORONARY ANGIOPLASTY WITH STENT PLACEMENT  2009   stents x2, in [place], [place]  . FOOT SURGERY     Right  . NECK SURGERY    . SPINE SURGERY  2012,2013  . TONSILLECTOMY       Social History   reports that he has been smoking. He has a 15.00 pack-year smoking history. He has never used smokeless tobacco. He reports that he does not drink alcohol and does not use drugs.   Family History   His Family history is unknown by patient.   Allergies Allergies  Allergen Reactions  . Acetaminophen Other (See Comments)    Reaction:  Unknown  Kidney failure Told not to take from home M.D. Related to kidney and renal failure   . Fentanyl   . Nsaids Other (See Comments)    Reaction:  Unknown  Kidney failure Patient states not to take from home M.D. Related to kidney and renal failure.  Other reaction(s): Unknown     Home Medications  Prior to Admission medications   Medication Sig Start Date End Date Taking? Authorizing Provider  amiodarone (PACERONE) 200 MG tablet Take 200 mg by mouth daily.     [provider]  aspirin EC 81 MG tablet Take 81 mg by mouth daily.    [provider]  atorvastatin (LIPITOR) 40 MG tablet Take 1 tablet (40 mg total) by mouth daily. 09/28/19   Dwyane Dee, MD  Cholecalciferol (VITAMIN D3) 50 MCG (2000 UT) TABS Take 2,000 Units by mouth daily.     [provider]  cyclobenzaprine (FLEXERIL) 5 MG tablet Take 1 tablet (5 mg total) by mouth 3 (three) times daily as needed. Patient taking differently: Take 5 mg by mouth 2 (two) times daily as needed for muscle spasms.  05/25/19   Menshew, Dannielle Karvonen, PA-C  DULoxetine (CYMBALTA) 60 MG capsule Take 60 mg by mouth daily.     [provider]  Ensure Max Protein (ENSURE MAX PROTEIN) LIQD Take 330 mLs (11 oz total) by mouth 2 (two) times daily between meals. 06/14/19   Raiford Noble Latif, DO  furosemide (LASIX) 20 MG tablet Take 1 tablet (20 mg total) by mouth daily. Patient taking differently: Take 20 mg by mouth daily as needed for fluid.  09/14/19   Danford, Suann Larry, MD  insulin glargine (LANTUS) 100 UNIT/ML injection Inject 22 Units into the skin at bedtime.     [provider]  insulin lispro (HUMALOG) 100 UNIT/ML injection Inject 4 Units into the skin 3 (three) times daily before meals. Hold for FSBG <100    [provider]  ipratropium-albuterol (DUONEB) 0.5-2.5 (3) MG/3ML SOLN Take 3 mLs by nebulization every 6 (six) hours as needed. Patient taking differently: Take 3 mLs by nebulization every 6 (six) hours as needed (wheezing and shortness of breath).  06/14/19   Raiford Noble Latif, DO  lactulose (CHRONULAC) 10 GM/15ML solution Take 20 g by mouth daily as needed for mild constipation.    [provider]  lamoTRIgine (LAMICTAL) 100 MG tablet Take 100 mg by mouth daily.    [provider]  Menthol-Methyl Salicylate (SALONPAS PAIN RELIEF PATCH EX) Place 1 patch onto the skin every 12 (twelve) hours as needed (aches and pains).    [provider]  metFORMIN (GLUCOPHAGE-XR) 750 MG 24 hr tablet Take 1 tablet (750 mg total) by mouth 2 (two) times daily. 09/16/19   Danford, Suann Larry, MD  midodrine (PROAMATINE) 10 MG tablet Take 1 tablet (10 mg total) by mouth every 8 (eight) hours. 09/28/19   Dwyane Dee, MD  NAC 600 MG CAPS Take 600 mg by mouth daily. 09/08/19   [provider]  nicotine (NICODERM CQ - DOSED IN MG/24 HOURS) 21 mg/24hr patch Place onto the skin. Patient not taking: Reported on 10/21/2019    [provider]  NITROSTAT 0.4 MG SL tablet DISSOLVE (1) TABLET UNDER TONGUE AS NEEDED TO RELIEVE CHEST PAIN. MAYREPEAT EVERY 5  MINUTES. Patient taking differently: Place 0.4 mg under the tongue every 5 (five) minutes as needed for chest pain.  10/06/15   Wellington Hampshire, MD  Oxycodone HCl 10 MG  TABS Take 1 tablet (10 mg total) by mouth 3 (three) times daily. 09/14/19   Danford, Suann Larry, MD  oxymetazoline (AFRIN) 0.05 % nasal spray Place 2 sprays into both nostrils 2 (two) times daily as needed for congestion.    [provider]  pantoprazole (PROTONIX) 40 MG tablet Take 40 mg by mouth daily.     [provider]  predniSONE (DELTASONE) 5 MG tablet Take 2.5 mg by mouth daily with breakfast.    [provider]  pregabalin (LYRICA) 200 MG capsule Take 1 capsule (200 mg total) by mouth 3 (three) times daily. 09/14/19   Danford, Suann Larry, MD  rivaroxaban (XARELTO) 20 MG TABS tablet Take 20 mg by mouth daily with supper.    [provider]  senna-docusate (SENOKOT-S) 8.6-50 MG tablet Take 1 tablet by mouth daily.     [provider]  tamsulosin (FLOMAX) 0.4 MG CAPS capsule Take 1 capsule (0.4 mg total) by mouth daily after supper. Patient not taking: Reported on 10/21/2019 10/10/19   Nolberto Hanlon, MD  tiotropium (SPIRIVA) 18 MCG inhalation capsule Place into inhaler and inhale. Patient not taking: Reported on 10/21/2019    [provider]  traZODone (DESYREL) 100 MG tablet Take 100 mg by mouth at bedtime. 09/08/19   [provider]     Critical care time: 60 minutes     Darel Hong, [facility] [place] [facility] Pager: [phone]

## 2019-10-22 NOTE — Addendum Note (Signed)
Addended by: Kendrick Fries on: 10/22/2019 09:44 AM   Modules accepted: Orders

## 2019-10-22 NOTE — ED Notes (Signed)
repeat VBG completed.

## 2019-10-22 NOTE — ED Provider Notes (Addendum)
[facility] Emergency Department Provider Note   ____________________________________________   First MD Initiated Contact with Patient 10/22/19 2024     (approximate)  I have reviewed the triage vital signs and the nursing notes.   HISTORY  Chief Complaint Altered Mental Status   HPI Gregory Hall is a 64 y.o. male with past medical history of hypertension, hyperlipidemia, diabetes, A. fib on Xarelto, CAD, CHF, CKD, COPD on 3 L, and chronic back pain who presents to the ED for altered mental status.  History is limited due to patient's somnolence and confusion.  Per EMS, staff at patient's group home noticed that he was progressively confused throughout the day today.  He seemed to worsen after receiving his evening medications and so EMS was called.  He is reportedly on chronic pain medication and EMS gave a dose of Narcan without significant response.  Patient is somnolent but arousable to voice, complains of pain in his legs but otherwise denies complaints.  He denies taking additional medication this evening.        Past Medical History:  Diagnosis Date  . AKI (acute kidney injury) ([place]) 09/12/2019  . CAD (coronary artery disease)    s/p PTCA and stent x2  . Chest pain   . CHF (congestive heart failure) ([place])   . Chronic pain syndrome   . COPD (chronic obstructive pulmonary disease) ([place])   . Degenerative cervical disc   . Depression   . Diabetes mellitus without complication ([place])   . Dyslipidemia   . GERD (gastroesophageal reflux disease)   . Hernia 2014  . Hypertension   . MRSA (methicillin resistant staph aureus) culture positive 2011  . Neuropathy   . Nutcracker esophagus   . Rectus diastasis 07/19/2012    Patient Active Problem List   Diagnosis Date Noted  . Acute CHF (congestive heart failure) ([place]) 10/08/2019  . Chronic respiratory failure with hypoxia ([place]) 10/08/2019  . Atrial flutter ([place])   . Acute on chronic  respiratory failure with hypoxia ([place]) 09/13/2019  . Acute diastolic CHF (congestive heart failure) ([place]) 09/12/2019  . NSTEMI (non-ST elevated myocardial infarction) ([place]) 09/12/2019  . Acute kidney failure, unspecified ([place]) 09/12/2019  . Diabetes mellitus type 2, uncontrolled, with complications ([place]) 68/34/1962  . Chronic pain syndrome 06/10/2019  . COPD exacerbation ([place]) 06/10/2019  . COPD with acute exacerbation ([place]) 06/09/2019  . CAD (coronary artery disease) 06/09/2019  . HTN (hypertension) 06/09/2019  . Acute on chronic respiratory failure with hypoxia and hypercapnia ([place]) 06/09/2019  . Anxiety 06/09/2019  . Chronic prescription opiate use 06/09/2019  . Hyperglycemia 06/09/2019  . Subclavian artery stenosis, left ([place]) 08/20/2014  . Rectus diastasis 07/19/2012  . Hernia   . Edema 05/10/2011  . Goals of care, counseling/discussion 10/06/2010  . SMOKER 11/25/2009  . CAROTID BRUIT, RIGHT 11/24/2009  . Chest pain 11/24/2009  . Hyperlipidemia 03/30/2009  . Coronary atherosclerosis 03/30/2009  . HYPERTENSION, HX OF 03/30/2009    Past Surgical History:  Procedure Laterality Date  . BACK SURGERY  2012  . CARDIOVERSION N/A 09/26/2019   Procedure: CARDIOVERSION;  Surgeon: Minna Merritts, MD;  Location: [facility] ORS;  Service: Cardiovascular;  Laterality: N/A;  . CHOLECYSTECTOMY    . COLONOSCOPY  Jan 2014   [name]  . CORONARY ANGIOPLASTY WITH STENT PLACEMENT  2009   stents x2, in [place], [place]  . FOOT SURGERY     Right  . NECK SURGERY    . SPINE SURGERY  2012,2013  .  TONSILLECTOMY      Prior to Admission medications   Medication Sig Start Date End Date Taking? Authorizing Provider  amiodarone (PACERONE) 200 MG tablet Take 200 mg by mouth daily.     [provider]  aspirin EC 81 MG tablet Take 81 mg by mouth daily.    [provider]  atorvastatin (LIPITOR) 40 MG tablet Take 1 tablet (40 mg total) by mouth daily. 09/28/19   Dwyane Dee, MD   Cholecalciferol (VITAMIN D3) 50 MCG (2000 UT) TABS Take 2,000 Units by mouth daily.     [provider]  cyclobenzaprine (FLEXERIL) 5 MG tablet Take 1 tablet (5 mg total) by mouth 3 (three) times daily as needed. Patient taking differently: Take 5 mg by mouth 2 (two) times daily as needed for muscle spasms.  05/25/19   Menshew, Dannielle Karvonen, PA-C  DULoxetine (CYMBALTA) 60 MG capsule Take 60 mg by mouth daily.    [provider]  Ensure Max Protein (ENSURE MAX PROTEIN) LIQD Take 330 mLs (11 oz total) by mouth 2 (two) times daily between meals. 06/14/19   Raiford Noble Latif, DO  furosemide (LASIX) 20 MG tablet Take 1 tablet (20 mg total) by mouth daily. Patient taking differently: Take 20 mg by mouth daily as needed for fluid.  09/14/19   Danford, Suann Larry, MD  insulin glargine (LANTUS) 100 UNIT/ML injection Inject 22 Units into the skin at bedtime.     [provider]  insulin lispro (HUMALOG) 100 UNIT/ML injection Inject 4 Units into the skin 3 (three) times daily before meals. Hold for FSBG <100    [provider]  ipratropium-albuterol (DUONEB) 0.5-2.5 (3) MG/3ML SOLN Take 3 mLs by nebulization every 6 (six) hours as needed. Patient taking differently: Take 3 mLs by nebulization every 6 (six) hours as needed (wheezing and shortness of breath).  06/14/19   Raiford Noble Latif, DO  lactulose (CHRONULAC) 10 GM/15ML solution Take 20 g by mouth daily as needed for mild constipation.    [provider]  lamoTRIgine (LAMICTAL) 100 MG tablet Take 100 mg by mouth daily.    [provider]  Menthol-Methyl Salicylate (SALONPAS PAIN RELIEF PATCH EX) Place 1 patch onto the skin every 12 (twelve) hours as needed (aches and pains).    [provider]  metFORMIN (GLUCOPHAGE-XR) 750 MG 24 hr tablet Take 1 tablet (750 mg total) by mouth 2 (two) times daily. 09/16/19   Danford, Suann Larry, MD  midodrine (PROAMATINE) 10 MG tablet Take 1 tablet (10 mg  total) by mouth every 8 (eight) hours. 09/28/19   Dwyane Dee, MD  NAC 600 MG CAPS Take 600 mg by mouth daily. 09/08/19   [provider]  nicotine (NICODERM CQ - DOSED IN MG/24 HOURS) 21 mg/24hr patch Place onto the skin. Patient not taking: Reported on 10/21/2019    [provider]  NITROSTAT 0.4 MG SL tablet DISSOLVE (1) TABLET UNDER TONGUE AS NEEDED TO RELIEVE CHEST PAIN. MAYREPEAT EVERY 5 MINUTES. Patient taking differently: Place 0.4 mg under the tongue every 5 (five) minutes as needed for chest pain.  10/06/15   Wellington Hampshire, MD  Oxycodone HCl 10 MG TABS Take 1 tablet (10 mg total) by mouth 3 (three) times daily. 09/14/19   Danford, Suann Larry, MD  oxymetazoline (AFRIN) 0.05 % nasal spray Place 2 sprays into both nostrils 2 (two) times daily as needed for congestion.    [provider]  pantoprazole (PROTONIX) 40 MG tablet Take  40 mg by mouth daily.     [provider]  predniSONE (DELTASONE) 5 MG tablet Take 2.5 mg by mouth daily with breakfast.    [provider]  pregabalin (LYRICA) 200 MG capsule Take 1 capsule (200 mg total) by mouth 3 (three) times daily. 09/14/19   Danford, Suann Larry, MD  rivaroxaban (XARELTO) 20 MG TABS tablet Take 20 mg by mouth daily with supper.    [provider]  senna-docusate (SENOKOT-S) 8.6-50 MG tablet Take 1 tablet by mouth daily.     [provider]  tamsulosin (FLOMAX) 0.4 MG CAPS capsule Take 1 capsule (0.4 mg total) by mouth daily after supper. Patient not taking: Reported on 10/21/2019 10/10/19   Nolberto Hanlon, MD  tiotropium (SPIRIVA) 18 MCG inhalation capsule Place into inhaler and inhale. Patient not taking: Reported on 10/21/2019    [provider]  traZODone (DESYREL) 100 MG tablet Take 100 mg by mouth at bedtime. 09/08/19   [provider]    Allergies Acetaminophen, Fentanyl, and Nsaids  Family History  Family history unknown: Yes    Social  History Social History   Tobacco Use  . Smoking status: Current Every Day Smoker    Packs/day: 0.50    Years: 30.00    Pack years: 15.00  . Smokeless tobacco: Never Used  Substance Use Topics  . Alcohol use: No  . Drug use: No    Review of Systems  Constitutional: No fever/chills Eyes: No visual changes. ENT: No sore throat. Cardiovascular: Denies chest pain. Respiratory: Denies shortness of breath. Gastrointestinal: No abdominal pain.  No nausea, no vomiting.  No diarrhea.  No constipation. Genitourinary: Negative for dysuria. Musculoskeletal: Positive for back and bilateral leg pain. Skin: Negative for rash. Neurological: Negative for headaches, focal weakness or numbness.  ____________________________________________   PHYSICAL EXAM:  VITAL SIGNS: ED Triage Vitals  Enc Vitals Group     BP      Pulse      Resp      Temp      Temp src      SpO2      Weight      Height      Head Circumference      Peak Flow      Pain Score      Pain Loc      Pain Edu?      Excl. in [place]?     Constitutional: Somnolent but arousable to voice. Eyes: Conjunctivae are normal.  Pupils equal round and reactive to light bilaterally. Head: Atraumatic. Nose: No congestion/rhinnorhea. Mouth/Throat: Mucous membranes are moist. Neck: Normal ROM Cardiovascular: Normal rate, regular rhythm. Grossly normal heart sounds. Respiratory: Normal respiratory effort.  No retractions. Lungs with expiratory wheezing throughout. Gastrointestinal: Soft and nontender. No distention. Genitourinary: deferred Musculoskeletal: No lower extremity tenderness, 2+ pitting edema to knees bilaterally. Neurologic:  Normal speech and language. No gross focal neurologic deficits are appreciated. Skin:  Skin is warm, dry and intact. No rash noted. Psychiatric: Mood and affect are normal. Speech and behavior are normal.  ____________________________________________   LABS (all labs ordered are listed, but only  abnormal results are displayed)  Labs Reviewed  CBC WITH DIFFERENTIAL/PLATELET - Abnormal; Notable for the following components:      Result Value   Hemoglobin 11.5 (*)    MCHC 29.3 (*)    RDW 17.1 (*)    Abs Immature Granulocytes 0.12 (*)    All other components within normal limits  COMPREHENSIVE  METABOLIC PANEL - Abnormal; Notable for the following components:   Potassium 5.3 (*)    Chloride 96 (*)    CO2 33 (*)    Glucose, Bld 125 (*)    Calcium 8.6 (*)    All other components within normal limits  URINALYSIS, COMPLETE (UACMP) WITH MICROSCOPIC - Abnormal; Notable for the following components:   Color, Urine YELLOW (*)    APPearance CLEAR (*)    All other components within normal limits  BLOOD GAS, VENOUS - Abnormal; Notable for the following components:   pH, Ven 7.19 (*)    pCO2, Ven 112 (*)    Bicarbonate 42.8 (*)    Acid-Base Excess 11.0 (*)    All other components within normal limits  BRAIN NATRIURETIC PEPTIDE - Abnormal; Notable for the following components:   B Natriuretic Peptide 198.3 (*)    All other components within normal limits  BLOOD GAS, VENOUS - Abnormal; Notable for the following components:   pH, Ven 7.22 (*)    pCO2, Ven 112 (*)    pO2, Ven 31.0 (*)    Bicarbonate 45.8 (*)    Acid-Base Excess 14.4 (*)    All other components within normal limits  RESPIRATORY PANEL BY RT PCR (FLU A&B, COVID)  ETHANOL  URINE DRUG SCREEN, QUALITATIVE ([facility] ONLY)  TROPONIN I (HIGH SENSITIVITY)  TROPONIN I (HIGH SENSITIVITY)   ____________________________________________  EKG  ED ECG REPORT I, Blake Divine, the attending physician, personally viewed and interpreted this ECG.   Date: 10/22/2019  EKG Time: 20:23  Rate: 91  Rhythm: normal sinus rhythm  Axis: LAD  Intervals:none  ST&T Change: None  ED ECG REPORT I, Blake Divine, the attending physician, personally viewed and interpreted this ECG.   Date: 10/22/2019  EKG Time: 20:35  Rate: 97  Rhythm:  normal sinus rhythm  Axis: Normal  Intervals:none  ST&T Change: None    PROCEDURES  Procedure(s) performed (including Critical Care):  .Critical Care Performed by: Blake Divine, MD Authorized by: Blake Divine, MD   Critical care provider statement:    Critical care time (minutes):  45   Critical care time was exclusive of:  Separately billable procedures and treating other patients and teaching time   Critical care was necessary to treat or prevent imminent or life-threatening deterioration of the following conditions:  Respiratory failure   Critical care was time spent personally by me on the following activities:  Discussions with consultants, evaluation of patient's response to treatment, examination of patient, ordering and performing treatments and interventions, ordering and review of laboratory studies, ordering and review of radiographic studies, pulse oximetry, re-evaluation of patient's condition, obtaining history from patient or surrogate and review of old charts   I assumed direction of critical care for this patient from another provider in my specialty: no    Procedure Name: Intubation Date/Time: 10/22/2019 11:03 PM Performed by: Blake Divine, MD Pre-anesthesia Checklist: Patient identified, Patient being monitored, Emergency Drugs available, Timeout performed and Suction available Oxygen Delivery Method: Non-rebreather mask Preoxygenation: Pre-oxygenation with 100% oxygen Induction Type: Rapid sequence Ventilation: Mask ventilation without difficulty Laryngoscope Size: Mac and 4 Grade View: Grade I Tube size: 8.0 mm Number of attempts: 1 Airway Equipment and Method: Video-laryngoscopy Placement Confirmation: ETT inserted through vocal cords under direct vision,  CO2 detector and Breath sounds checked- equal and bilateral Secured at: 24 cm Tube secured with: ETT holder Dental Injury: Teeth and Oropharynx as per pre-operative assessment  ____________________________________________   INITIAL IMPRESSION / ASSESSMENT AND PLAN / ED COURSE       64 year old male with past medical history of hypertension, hyperlipidemia, diabetes, CAD, CHF, A. fib on Xarelto, PAD, CKD, COPD on 3 L who presents to the ED for altered mental status noted to be worsening throughout the day.  On arrival, patient is somnolent but arousable to voice, voices no difficulty breathing and complains only of his chronic pain in his back and legs.  Vital signs are reassuring but patient does seem to have some wheezing on exam which we will treat with DuoNeb.  He does also appear slightly fluid overloaded with lower extremity edema.  We will check VBG for CO2 narcosis, would also consider medication effect with his opiates, however he did not seem to respond to Narcan with EMS.  We will screen UA and chest x-ray for evidence of infectious process, although this seems less likely.  Would also consider AKI and uremia as he has had difficulty balancing his diuretics recently.  He does not appear to have any focal neurologic deficits on exam, will screen CT head.  VBG shows CO2 of 112 with pH of 7.19 consistent with a respiratory acidosis.  We will treat patient with duo nebs and steroids, initiate BiPAP.  Patient's mental status has seemed to gradually worsen despite BiPAP and repeat VBG is unchanged.  Case discussed with ICU, who accepts patient for admission and agrees with plan for intubation.  Patient was intubated without difficulty.  I suspect his ongoing respiratory failure is due to COPD exacerbation, but given his lack of response to treatment, we will also check CT head.        ____________________________________________   FINAL CLINICAL IMPRESSION(S) / ED DIAGNOSES  Final diagnoses:  Acute on chronic respiratory failure with hypoxia and hypercapnia (HCC)  Altered mental status, unspecified altered mental status type     ED Discharge  Orders    None       Note:  This document was prepared using Dragon voice recognition software and may include unintentional dictation errors.   Blake Divine, MD 10/22/19 [id number]    Blake Divine, MD 10/22/19 [phone]

## 2019-10-22 NOTE — ED Notes (Signed)
Pt placed on bipap  

## 2019-10-22 NOTE — ED Notes (Addendum)
Pt intubated by MD. 8.0 tube 24 at lip.

## 2019-10-22 NOTE — ED Notes (Signed)
Xray at bedside for tube placement.

## 2019-10-22 NOTE — ED Triage Notes (Signed)
Pt arrived via EMS from group home where they reported AMS. Pt responds painful stimuli as well as voice. Unable to understand words pt is saying. Pt last seen on 9/29 here in ED. Pt wears 3L oxygen chronically. Pt arrived by EMS on non-re breather.

## 2019-10-22 NOTE — Progress Notes (Signed)
PHARMACY CONSULT NOTE - FOLLOW UP  Pharmacy Consult for Electrolyte Monitoring and Replacement   Recent Labs: Potassium (mmol/L)  Date Value  10/22/2019 5.3 (H)  03/31/2013 3.6   Magnesium (mg/dL)  Date Value  71/21/9758 1.7  06/25/2012 2.4   Calcium (mg/dL)  Date Value  83/25/4982 8.6 (L)   Calcium, Total (mg/dL)  Date Value  64/15/8309 7.6 (L)   Albumin (g/dL)  Date Value  40/76/8088 3.8  10/06/2019 3.9  03/30/2013 3.2 (L)   Phosphorus (mg/dL)  Date Value  11/11/1592 3.3  03/20/2012 3.4   Sodium (mmol/L)  Date Value  10/22/2019 142  10/17/2019 142  03/31/2013 142   Assessment: Ca 8.6, Alb 3.8, K 5.3  Goal of Therapy:  Lytes WNL, K >/= 4  Plan:  Calcium Gluconate 1gm IV now x 1 F/U all labs in am  Wayland Denis ,PharmD Clinical Pharmacist 10/22/2019 11:22 PM

## 2019-10-23 DIAGNOSIS — J441 Chronic obstructive pulmonary disease with (acute) exacerbation: Principal | ICD-10-CM

## 2019-10-23 DIAGNOSIS — J9621 Acute and chronic respiratory failure with hypoxia: Secondary | ICD-10-CM | POA: Diagnosis not present

## 2019-10-23 DIAGNOSIS — J9622 Acute and chronic respiratory failure with hypercapnia: Secondary | ICD-10-CM | POA: Diagnosis not present

## 2019-10-23 LAB — CBC WITH DIFFERENTIAL/PLATELET
Abs Immature Granulocytes: 0.06 10*3/uL (ref 0.00–0.07)
Basophils Absolute: 0 10*3/uL (ref 0.0–0.1)
Basophils Relative: 0 %
Eosinophils Absolute: 0 10*3/uL (ref 0.0–0.5)
Eosinophils Relative: 0 %
HCT: 31.2 % — ABNORMAL LOW (ref 39.0–52.0)
Hemoglobin: 8.8 g/dL — ABNORMAL LOW (ref 13.0–17.0)
Immature Granulocytes: 1 %
Lymphocytes Relative: 10 %
Lymphs Abs: 0.6 10*3/uL — ABNORMAL LOW (ref 0.7–4.0)
MCH: 26.3 pg (ref 26.0–34.0)
MCHC: 28.2 g/dL — ABNORMAL LOW (ref 30.0–36.0)
MCV: 93.1 fL (ref 80.0–100.0)
Monocytes Absolute: 0.1 10*3/uL (ref 0.1–1.0)
Monocytes Relative: 2 %
Neutro Abs: 5.1 10*3/uL (ref 1.7–7.7)
Neutrophils Relative %: 87 %
Platelets: 179 10*3/uL (ref 150–400)
RBC: 3.35 MIL/uL — ABNORMAL LOW (ref 4.22–5.81)
RDW: 17.4 % — ABNORMAL HIGH (ref 11.5–15.5)
WBC: 5.9 10*3/uL (ref 4.0–10.5)
nRBC: 0 % (ref 0.0–0.2)

## 2019-10-23 LAB — COMPREHENSIVE METABOLIC PANEL

## 2019-10-23 LAB — RESPIRATORY PANEL BY PCR

## 2019-10-23 LAB — GLUCOSE, CAPILLARY
Glucose-Capillary: 133 mg/dL — ABNORMAL HIGH (ref 70–99)
Glucose-Capillary: 138 mg/dL — ABNORMAL HIGH (ref 70–99)
Glucose-Capillary: 139 mg/dL — ABNORMAL HIGH (ref 70–99)
Glucose-Capillary: 141 mg/dL — ABNORMAL HIGH (ref 70–99)
Glucose-Capillary: 180 mg/dL — ABNORMAL HIGH (ref 70–99)
Glucose-Capillary: 196 mg/dL — ABNORMAL HIGH (ref 70–99)
Glucose-Capillary: 202 mg/dL — ABNORMAL HIGH (ref 70–99)

## 2019-10-23 LAB — BASIC METABOLIC PANEL
Anion gap: 9 (ref 5–15)
BUN: 13 mg/dL (ref 8–23)
CO2: 35 mmol/L — ABNORMAL HIGH (ref 22–32)
Calcium: 8.6 mg/dL — ABNORMAL LOW (ref 8.9–10.3)
Chloride: 98 mmol/L (ref 98–111)
Creatinine, Ser: 1.04 mg/dL (ref 0.61–1.24)
GFR, Estimated: >60 mL/min (ref >60)
Glucose, Bld: 150 mg/dL — ABNORMAL HIGH (ref 70–99)
Potassium: 4.7 mmol/L (ref 3.5–5.1)
Sodium: 142 mmol/L (ref 135–145)

## 2019-10-23 LAB — TRIGLYCERIDES: Triglycerides: 52 mg/dL (ref <150)

## 2019-10-23 LAB — TROPONIN I (HIGH SENSITIVITY): Troponin I (High Sensitivity): 6 ng/L (ref <18)

## 2019-10-23 LAB — BLOOD GAS, ARTERIAL

## 2019-10-23 LAB — PROCALCITONIN: Procalcitonin: 0.12 ng/mL

## 2019-10-23 MED ORDER — POLYETHYLENE GLYCOL 3350 17 G PO PACK
17.0000 g | PACK | Freq: Every day | ORAL | Status: DC
  Administered 2019-10-23 – 2019-10-24 (×2): 17 g
  Filled 2019-10-23 (×2): qty 1

## 2019-10-23 MED ORDER — POLYETHYLENE GLYCOL 3350 17 G PO PACK: 17.0000 g | PACK | Freq: Every day | ORAL | Status: DC | PRN

## 2019-10-23 MED ORDER — SODIUM CHLORIDE 0.9 % IV BOLUS
500.0000 mL | Freq: Once | INTRAVENOUS | Status: AC
  Administered 2019-10-23: 500 mL via INTRAVENOUS

## 2019-10-23 MED ORDER — CHLORHEXIDINE GLUCONATE CLOTH 2 % EX PADS
6.0000 | MEDICATED_PAD | Freq: Every day | CUTANEOUS | Status: DC
  Administered 2019-10-23 – 2019-10-27 (×4): 6 via TOPICAL

## 2019-10-23 MED ORDER — NOREPINEPHRINE 4 MG/250ML-% IV SOLN
2.0000 ug/min | INTRAVENOUS | Status: DC
  Administered 2019-10-23: 6 ug/min via INTRAVENOUS
  Administered 2019-10-23 (×2): 2 ug/min via INTRAVENOUS
  Filled 2019-10-23 (×2): qty 250

## 2019-10-23 MED ORDER — FENTANYL 2500MCG IN NS 250ML (10MCG/ML) PREMIX INFUSION
0.0000 ug/h | INTRAVENOUS | Status: DC
  Administered 2019-10-23: 200 ug/h via INTRAVENOUS
  Administered 2019-10-23: 25 ug/h via INTRAVENOUS
  Administered 2019-10-24: 20 ug/h via INTRAVENOUS
  Administered 2019-10-25: 200 ug/h via INTRAVENOUS
  Filled 2019-10-23 (×4): qty 250

## 2019-10-23 MED ORDER — ACETAMINOPHEN 160 MG/5ML PO SOLN
650.0000 mg | Freq: Four times a day (QID) | ORAL | Status: DC | PRN
  Administered 2019-10-23: 650 mg
  Filled 2019-10-23 (×2): qty 20.3

## 2019-10-23 MED ORDER — SODIUM CHLORIDE 0.9 % IV SOLN: INTRAVENOUS | Status: DC

## 2019-10-23 MED ORDER — SODIUM CHLORIDE 0.9 % IV SOLN
250.0000 mL | INTRAVENOUS | Status: DC
  Administered 2019-10-23: 250 mL via INTRAVENOUS

## 2019-10-23 MED ORDER — METHYLPREDNISOLONE SODIUM SUCC 40 MG IJ SOLR
40.0000 mg | Freq: Two times a day (BID) | INTRAMUSCULAR | Status: DC
  Administered 2019-10-23 – 2019-10-27 (×8): 40 mg via INTRAVENOUS
  Filled 2019-10-23 (×8): qty 1

## 2019-10-23 MED ORDER — RIVAROXABAN 20 MG PO TABS
20.0000 mg | ORAL_TABLET | Freq: Every day | ORAL | Status: DC
  Administered 2019-10-23 – 2019-10-27 (×5): 20 mg
  Filled 2019-10-23 (×6): qty 1

## 2019-10-23 MED ORDER — CALCITONIN (SALMON) 200 UNIT/ML IJ SOLN
4.0000 [IU]/kg | Freq: Two times a day (BID) | INTRAMUSCULAR | Status: DC
  Administered 2019-10-23: 366 [IU] via SUBCUTANEOUS
  Filled 2019-10-23 (×2): qty 1.83

## 2019-10-23 MED ORDER — DOCUSATE SODIUM 50 MG/5ML PO LIQD
100.0000 mg | Freq: Two times a day (BID) | ORAL | Status: DC
  Administered 2019-10-23 – 2019-10-24 (×4): 100 mg
  Filled 2019-10-23 (×4): qty 10

## 2019-10-23 MED ORDER — SODIUM CHLORIDE 0.9% FLUSH: 10.0000 mL | INTRAVENOUS | Status: DC | PRN

## 2019-10-23 MED ORDER — POTASSIUM CHLORIDE 20 MEQ/15ML (10%) PO SOLN
40.0000 meq | Freq: Once | ORAL | Status: AC
  Administered 2019-10-23: 40 meq
  Filled 2019-10-23: qty 30

## 2019-10-23 MED ORDER — CALCITONIN (SALMON) 200 UNIT/ML IJ SOLN: 4.0000 [IU]/kg | Freq: Two times a day (BID) | INTRAMUSCULAR | Status: DC

## 2019-10-23 MED ORDER — MIDODRINE HCL 5 MG PO TABS
10.0000 mg | ORAL_TABLET | Freq: Three times a day (TID) | ORAL | Status: DC
  Administered 2019-10-23 – 2019-10-25 (×7): 10 mg
  Filled 2019-10-23 (×7): qty 2

## 2019-10-23 MED ORDER — PROSOURCE TF PO LIQD
45.0000 mL | Freq: Two times a day (BID) | ORAL | Status: DC
  Administered 2019-10-23 – 2019-10-27 (×4): 45 mL
  Filled 2019-10-23 (×4): qty 45

## 2019-10-23 MED ORDER — VITAL AF 1.2 CAL PO LIQD
1000.0000 mL | ORAL | Status: DC
  Administered 2019-10-23: 1000 mL

## 2019-10-23 NOTE — ED Notes (Signed)
Pt mitts placed due to attempting to discontinue resp therapy. Resp swab obtained and taken to lab.

## 2019-10-23 NOTE — Progress Notes (Signed)
GOALS OF CARE DISCUSSION  The Clinical status was relayed to family in detail. Sister Terri notified  Updated and notified of patients medical condition.  Patient is having a weak cough and struggling to remove secretions.   patient with increased WOB and using accessory muscles to breathe Explained to family course of therapy and the modalities     Patient with Progressive multiorgan failure with very low chance of meaningful recovery despite all aggressive and optimal medical therapy. Patient is in the Dying  Process associated with Suffering.  Family understands the situation.  They have consented and agreed to DNR status  Family are satisfied with Plan of action and management. All questions answered    Lucie Leather, M.D.  [facility]  Medical Director [facility] Medical Director [facility] Cardio-Pulmonary Department

## 2019-10-23 NOTE — Progress Notes (Signed)
Pt transported to ICU on the vent without incident. Pt remains on the vent and is tol well at this time. Report given to ICU RT. 

## 2019-10-23 NOTE — Progress Notes (Signed)
CRITICAL CARE NOTE 64 year old male admitted with acute metabolic encephalopathy and acute on chronic hypoxic hypercapnic respiratory failure secondary to COPD Exacerbation.  Failed trial of BiPAP requiring intubation in the ED.   Significant Hospital Events   10/13: Presented to ED due to altered mental status, required intubation due to worsening somnolence and unchanged ABG 10/14 remains intubated  Consults:  PCCM  Procedures:  10/13: Endotracheal intubation  Significant Diagnostic Tests:  10/13: Chest x-ray>>Portable AP upright view at 2050 hours. Stable lung volumes, mediastinal contours and thoracic spinal stimulator. Chronic lower lobe atelectasis and/or scarring. Chronic increased interstitial opacity in both lungs. Lung markings today appear at baseline. No pneumothorax or pleural effusion. Visualized tracheal air column is within normal limits. Partially visible cervical ACDF. No acute osseous abnormality identified. 10/13: CT head>>1. No acute intracranial pathology. 2. Paranasal sinus disease.  Micro Data:  10/13: SARS-CoV-2>> negative 10/13: Influenza>> negative 10/13: Respiratory Viral Panel>> 10/14: Tracheal aspirate>>      CC  follow up respiratory failure  SUBJECTIVE Patient remains critically ill Prognosis is guarded   BP (!) 99/54   Pulse 83   Temp 100.1 F (37.8 C) (Oral)   Resp (!) 24   Ht 6' (1.829 m)   Wt 91.7 kg   SpO2 100%   BMI 27.42 kg/m    I/O last 3 completed shifts: In: -  Out: 1200 [Urine:1200] No intake/output data recorded.  SpO2: 100 % O2 Flow Rate (L/min): 3 L/min FiO2 (%): 40 %  Estimated body mass index is 27.42 kg/m as calculated from the following:   Height as of this encounter: 6' (1.829 m).   Weight as of this encounter: 91.7 kg.    Vent Mode: PRVC FiO2 (%):  [40 %-50 %] 40 % Set Rate:  [24 bmp] 24 bmp Vt Set:  [500 mL] 500 mL PEEP:  [2 cmH20-5 cmH20] 2 cmH20 Plateau Pressure:  [19 cmH20] 19  cmH20 CBC    Component Value Date/Time   WBC 5.9 10/23/2019 0411   RBC 3.35 (L) 10/23/2019 0411   HGB 8.8 (L) 10/23/2019 0411   HGB 10.4 (L) 10/17/2019 1013   HCT 31.2 (L) 10/23/2019 0411   HCT 33.5 (L) 10/17/2019 1013   PLT 179 10/23/2019 0411   PLT 280 10/17/2019 1013   MCV 93.1 10/23/2019 0411   MCV 87 10/17/2019 1013   MCV 97 03/31/2013 0224   MCH 26.3 10/23/2019 0411   MCHC 28.2 (L) 10/23/2019 0411   RDW 17.4 (H) 10/23/2019 0411   RDW 16.0 (H) 10/17/2019 1013   RDW 14.0 03/31/2013 0224   LYMPHSABS 0.6 (L) 10/23/2019 0411   LYMPHSABS 2.3 10/06/2019 0829   LYMPHSABS 2.8 03/31/2013 0224   MONOABS 0.1 10/23/2019 0411   MONOABS 0.6 03/31/2013 0224   EOSABS 0.0 10/23/2019 0411   EOSABS 0.3 10/06/2019 0829   EOSABS 0.3 03/31/2013 0224   BASOSABS 0.0 10/23/2019 0411   BASOSABS 0.1 10/06/2019 0829   BASOSABS 0.1 03/31/2013 0224   BMP Latest Ref Rng & Units 10/23/2019 10/22/2019 10/17/2019  Glucose 70 - 99 mg/dL 107(H) 125(H) 207(H)  BUN 8 - 23 mg/dL _0 Creatinine 0.61 - 1.24 mg/dL 0.89 1.21 1.30(H)  BUN/Creat Ratio 10 - 24 - - 8(L)  Sodium 135 - 145 mmol/L 138 142 142  Potassium 3.5 - 5.1 mmol/L 3.3(L) 5.3(H) 4.3  Chloride 98 - 111 mmol/L 102 96(L) 100  CO2 22 - 32 mmol/L 29 33(H) 26  Calcium 8.9 - 10.3 mg/dL >15.0(HH) 8.6(L)  8.7    SIGNIFICANT EVENTS   REVIEW OF SYSTEMS  PATIENT IS UNABLE TO PROVIDE COMPLETE REVIEW OF SYSTEMS DUE TO SEVERE CRITICAL ILLNESS        PHYSICAL EXAMINATION:  GENERAL:critically ill appearing, +resp distress NECK: Supple.  PULMONARY: +rhonchi, +wheezing CARDIOVASCULAR: S1 and S2. Regular rate and rhythm. No murmurs, rubs, or gallops.  GASTROINTESTINAL: Soft, nontender, -distended.  Positive bowel sounds.   MUSCULOSKELETAL: No swelling, clubbing, or edema.  NEUROLOGIC: obtunded, GCS<8 SKIN:intact,warm,dry  MEDICATIONS: I have reviewed all medications and confirmed regimen as documented   CULTURE RESULTS   Recent  Results (from the past 240 hour(s))  Respiratory Panel by RT PCR (Flu A&B, Covid) - Nasopharyngeal Swab     Status: None   Collection Time: 10/22/19  9:22 PM   Specimen: Nasopharyngeal Swab  Result Value Ref Range Status   SARS Coronavirus 2 by RT PCR NEGATIVE NEGATIVE Final    Comment: (NOTE) SARS-CoV-2 target nucleic acids are NOT DETECTED.  The SARS-CoV-2 RNA is generally detectable in upper respiratoy specimens during the acute phase of infection. The lowest concentration of SARS-CoV-2 viral copies this assay can detect is 131 copies/mL. A negative result does not preclude SARS-Cov-2 infection and should not be used as the sole basis for treatment or other patient management decisions. A negative result may occur with  improper specimen collection/handling, submission of specimen other than nasopharyngeal swab, presence of viral mutation(s) within the areas targeted by this assay, and inadequate number of viral copies (<131 copies/mL). A negative result must be combined with clinical observations, patient history, and epidemiological information. The expected result is Negative.  Fact Sheet for Patients:  PinkCheek.be  Fact Sheet for Healthcare Providers:  GravelBags.it  This test is no t yet approved or cleared by the [place] FDA and  has been authorized for detection and/or diagnosis of SARS-CoV-2 by FDA under an Emergency Use Authorization (EUA). This EUA will remain  in effect (meaning this test can be used) for the duration of the COVID-19 declaration under Section 564(b)(1) of the Act, 21 U.S.C. section 360bbb-3(b)(1), unless the authorization is terminated or revoked sooner.     Influenza A by PCR NEGATIVE NEGATIVE Final   Influenza B by PCR NEGATIVE NEGATIVE Final    Comment: (NOTE) The Xpert Xpress SARS-CoV-2/FLU/RSV assay is intended as an aid in  the diagnosis of influenza from Nasopharyngeal swab  specimens and  should not be used as a sole basis for treatment. Nasal washings and  aspirates are unacceptable for Xpert Xpress SARS-CoV-2/FLU/RSV  testing.  Fact Sheet for Patients: PinkCheek.be  Fact Sheet for Healthcare Providers: GravelBags.it  This test is not yet approved or cleared by the [place] FDA and  has been authorized for detection and/or diagnosis of SARS-CoV-2 by  FDA under an Emergency Use Authorization (EUA). This EUA will remain  in effect (meaning this test can be used) for the duration of the  Covid-19 declaration under Section 564(b)(1) of the Act, 21  U.S.C. section 360bbb-3(b)(1), unless the authorization is  terminated or revoked. Performed at [facility], [address] [place], [place] [address]   Respiratory Panel by PCR     Status: None   Collection Time: 10/23/19 12:02 AM   Specimen: Nasopharyngeal Swab; Respiratory  Result Value Ref Range Status   Adenovirus NOT DETECTED NOT DETECTED Final   Coronavirus 229E NOT DETECTED NOT DETECTED Final    Comment: (NOTE) The Coronavirus on the Respiratory Panel, DOES NOT test for the novel  Coronavirus (2019 nCoV)    Coronavirus HKU1 NOT DETECTED NOT DETECTED Final   Coronavirus NL63 NOT DETECTED NOT DETECTED Final   Coronavirus OC43 NOT DETECTED NOT DETECTED Final   Metapneumovirus NOT DETECTED NOT DETECTED Final   Rhinovirus / Enterovirus NOT DETECTED NOT DETECTED Final   Influenza A NOT DETECTED NOT DETECTED Final   Influenza B NOT DETECTED NOT DETECTED Final   Parainfluenza Virus 1 NOT DETECTED NOT DETECTED Final   Parainfluenza Virus 2 NOT DETECTED NOT DETECTED Final   Parainfluenza Virus 3 NOT DETECTED NOT DETECTED Final   Parainfluenza Virus 4 NOT DETECTED NOT DETECTED Final   Respiratory Syncytial Virus NOT DETECTED NOT DETECTED Final   Bordetella pertussis NOT DETECTED NOT DETECTED Final   Chlamydophila pneumoniae NOT  DETECTED NOT DETECTED Final   Mycoplasma pneumoniae NOT DETECTED NOT DETECTED Final    Comment: Performed at [facility], [place] [address], [place], [place] [address]          IMAGING    DG Chest 1 View  Result Date: 10/22/2019 CLINICAL DATA:  Status post intubation EXAM: CHEST  1 VIEW COMPARISON:  10/22/2019 FINDINGS: Cardiac shadow is stable. Spinal stimulator is again noted. Gastric catheter is seen extending into the stomach. Endotracheal tube is noted in satisfactory position just above the aortic knob. Left basilar atelectasis is again seen. No new focal infiltrate is noted. IMPRESSION: Tubes and lines as described above. The remainder of the exam is stable in appearance. Electronically Signed   By: Inez Catalina M.D.   On: 10/22/2019 23:14   CT Head Wo Contrast  Result Date: 10/22/2019 CLINICAL DATA:  64 year old male with altered mental status. EXAM: CT HEAD WITHOUT CONTRAST TECHNIQUE: Contiguous axial images were obtained from the base of the skull through the vertex without intravenous contrast. COMPARISON:  Head CT dated 04/07/2019. FINDINGS: Brain: The ventricles and sulci appropriate size for patient's age. The gray-white matter discrimination is preserved. There is no acute intracranial hemorrhage. No mass effect or midline shift no extra-axial fluid collection. Vascular: No hyperdense vessel or unexpected calcification. Skull: Normal. Negative for fracture or focal lesion. Sinuses/Orbits: There is diffuse mucoperiosteal thickening of paranasal sinuses. Small left maxillary sinus air-fluid level. The mastoid air cells are clear. Other: None IMPRESSION: 1. No acute intracranial pathology. 2. Paranasal sinus disease. Electronically Signed   By: Anner Crete M.D.   On: 10/22/2019 23:37   DG Chest Portable 1 View  Result Date: 10/22/2019 CLINICAL DATA:  64 year old male with altered mental status. On home oxygen. EXAM: PORTABLE CHEST 1 VIEW COMPARISON:  Portable chest  10/08/2019 and earlier. FINDINGS: Portable AP upright view at 2050 hours. Stable lung volumes, mediastinal contours and thoracic spinal stimulator. Chronic lower lobe atelectasis and/or scarring. Chronic increased interstitial opacity in both lungs. Lung markings today appear at baseline. No pneumothorax or pleural effusion. Visualized tracheal air column is within normal limits. Partially visible cervical ACDF. No acute osseous abnormality identified. IMPRESSION: Stable chronic lung disease. No superimposed acute findings are identified. Electronically Signed   By: Genevie Ann M.D.   On: 10/22/2019 21:21     Nutrition Status:           Indwelling Urinary Catheter continued, requirement due to   Reason to continue Indwelling Urinary Catheter strict Intake/Output monitoring for hemodynamic instability         Ventilator continued, requirement due to severe respiratory failure   Ventilator Sedation RASS 0 to -2      ASSESSMENT AND PLAN  SYNOPSIS   Severe ACUTE Hypoxic and Hypercapnic Respiratory Failure due to acute COPD exacerbation with acute HFpEF CHF exacerbation  -continue Full MV support -continue Bronchodilator Therapy -Wean Fio2 and PEEP as tolerated -will perform SAT/SBT when respiratory parameters are met -VAP/VENT bundle implementation   SEVERE COPD EXACERBATION -continue IV steroids as prescribed -continue NEB THERAPY as prescribed -morphine as needed -wean fio2 as needed and tolerated   ACUTE SYSTOLIC CARDIAC FAILURE- HFpEF -oxygen as needed -Lasix as tolerated -follow up cardiac enzymes as indicated -follow up cardiology recs    NEUROLOGY - intubated and sedated - minimal sedation to achieve a RASS goal: -1 Wake up assessment pending   CARDIAC ICU monitoring  ID -continue IV abx as prescibed -follow up cultures  GI GI PROPHYLAXIS as indicated  NUTRITIONAL STATUS Nutrition Status:         DIET-->TF's as tolerated Constipation protocol  as indicated  ENDO - will use ICU hypoglycemic\Hyperglycemia protocol if indicated     ELECTROLYTES -follow labs as needed -replace as needed -pharmacy consultation and following   DVT/GI PRX ordered and assessed TRANSFUSIONS AS NEEDED MONITOR FSBS I Assessed the need for Labs I Assessed the need for Foley I Assessed the need for Central Venous Line Family Discussion when available I Assessed the need for Mobilization I made an Assessment of medications to be adjusted accordingly Safety Risk assessment completed   CASE DISCUSSED IN MULTIDISCIPLINARY ROUNDS WITH ICU TEAM  Critical Care Time devoted to patient care services described in this note is 35 minutes.   Overall, patient is critically ill, prognosis is guarded.    Corrin Parker, M.D.  [facility]  Medical Director [place] Director [facility] Cardio-Pulmonary Department

## 2019-10-23 NOTE — Progress Notes (Signed)
Frequent titrations necessary this shift to keep pt synchronous with vent, and calm enough to prevent self-extubation. Several episodes grasping at tubes.Levophed titrated to keep BP within desired range

## 2019-10-23 NOTE — Telephone Encounter (Signed)
Patient currently admitted at this time. 

## 2019-10-23 NOTE — Progress Notes (Addendum)
Initial Nutrition Assessment  DOCUMENTATION CODES:   Not applicable  INTERVENTION:   Initiate Vital 1.2 @55ml /hr + Pro-Source 83ml BID via tube  Free water flushes 55ml q4 hours to maintain tube patency   Propofol: 27.5 ml/hr- provides 726kcal/day   Regimen provides 2390kcal/day, 121g/day protein and 1234ml/day free water   NUTRITION DIAGNOSIS:   Inadequate oral intake related to inability to eat (pt sedated and ventilated) as evidenced by NPO status.  GOAL:   Provide needs based on ASPEN/SCCM guidelines  MONITOR:   Vent status, Labs, Weight trends, TF tolerance, Skin, I & O's  REASON FOR ASSESSMENT:   Ventilator    ASSESSMENT:   64 year old male with a past medical history significant for COPD on 3 L home O2, atrial fibrillation on Xarelto, hypertension, hyperlipidemia, CAD, HFpEF, CKD, diabetes mellitus, and chronic pain who presents to [facility] ED on 10/22/2019 from his group home due to complaints of altered mental status.   Pt sedated and ventilated. OGT in place. Plan is to start tube feeds today. RD is familiar with this pt from a recent precious admit. Pt with good appetite and oral intake at baseline. Per chart, pt is weight stable pta.   Medications reviewed and include: calcitonin, colace, insulin, protonix, miralax, NaCl @50ml /hr, fentanyl, levophed, propofol  Labs reviewed: K 3.3(L), Ca >15.0(H) BNP 198(H)- 10/13 Hgb 8.8(L), Hct 31.2(L) cbgs- 133, 138, 141 x 24 hrs AIC 7.0(H)- 9/4  Patient is currently intubated on ventilator support MV: 12.5 L/min Temp (24hrs), Avg:100.3 F (37.9 C), Min:98.9 F (37.2 C), Max:102.3 F (39.1 C)  Propofol: 27.5 ml/hr- provides 726kcal/day   MAP- >32mmHg  NUTRITION - FOCUSED PHYSICAL EXAM:    Most Recent Value  Orbital Region No depletion  Upper Arm Region Mild depletion  Thoracic and Lumbar Region No depletion  Buccal Region No depletion  Temple Region Moderate depletion  Clavicle Bone Region No depletion   Clavicle and Acromion Bone Region No depletion  Scapular Bone Region No depletion  Dorsal Hand Unable to assess  Patellar Region Mild depletion  Anterior Thigh Region No depletion  Posterior Calf Region No depletion  Edema (RD Assessment) Mild  Hair Reviewed  Eyes Reviewed  Mouth Reviewed  Skin Reviewed  Nails Reviewed     Diet Order:   Diet Order            Diet NPO time specified  Diet effective now                EDUCATION NEEDS:   No education needs have been identified at this time  Skin:  Skin Assessment: Reviewed RN Assessment (cellulitis, ecchymosis)  Last BM:  pta  Height:   Ht Readings from Last 1 Encounters:  10/23/19 6' (1.829 m)    Weight:   Wt Readings from Last 1 Encounters:  10/23/19 91.7 kg    Ideal Body Weight:  80.9 kg  BMI:  Body mass index is 27.42 kg/m.  Estimated Nutritional Needs:   Kcal:  2375kcal/day  Protein:  120-140g/day  Fluid:  >2L/day  08-13-1984 MS, RD, LDN Please refer to [facility] for RD and/or RD on-call/weekend/after hours pager

## 2019-10-23 NOTE — Progress Notes (Signed)
Pt transported to CT on the vent without incident and returned to ED.

## 2019-10-23 NOTE — Progress Notes (Signed)
Wake up assessment attempted pt became agitated, pulling at OG, ETT tubes. Increased salivation, gagging, respirations, heart rate and BP noted. 02 sats decreased. Sedation resumed. Pt now resting quietly in bed, resp even unlabored. He is synchronous with the vent. VS stable.

## 2019-10-24 ENCOUNTER — Inpatient Hospital Stay: Payer: Medicare Other

## 2019-10-24 DIAGNOSIS — Z515 Encounter for palliative care: Secondary | ICD-10-CM | POA: Diagnosis not present

## 2019-10-24 DIAGNOSIS — Z66 Do not resuscitate: Secondary | ICD-10-CM

## 2019-10-24 DIAGNOSIS — Z7189 Other specified counseling: Secondary | ICD-10-CM | POA: Diagnosis not present

## 2019-10-24 DIAGNOSIS — J9621 Acute and chronic respiratory failure with hypoxia: Secondary | ICD-10-CM | POA: Diagnosis not present

## 2019-10-24 LAB — CBC WITH DIFFERENTIAL/PLATELET
Abs Immature Granulocytes: 0.05 10*3/uL (ref 0.00–0.07)
Basophils Absolute: 0 10*3/uL (ref 0.0–0.1)
Basophils Relative: 0 %
Eosinophils Absolute: 0 10*3/uL (ref 0.0–0.5)
Eosinophils Relative: 0 %
HCT: 31.5 % — ABNORMAL LOW (ref 39.0–52.0)
Hemoglobin: 9.3 g/dL — ABNORMAL LOW (ref 13.0–17.0)
Immature Granulocytes: 1 %
Lymphocytes Relative: 6 %
Lymphs Abs: 0.6 10*3/uL — ABNORMAL LOW (ref 0.7–4.0)
MCH: 26.1 pg (ref 26.0–34.0)
MCHC: 29.5 g/dL — ABNORMAL LOW (ref 30.0–36.0)
MCV: 88.2 fL (ref 80.0–100.0)
Monocytes Absolute: 0.6 10*3/uL (ref 0.1–1.0)
Monocytes Relative: 6 %
Neutro Abs: 9.1 10*3/uL — ABNORMAL HIGH (ref 1.7–7.7)
Neutrophils Relative %: 87 %
Platelets: 257 10*3/uL (ref 150–400)
RBC: 3.57 MIL/uL — ABNORMAL LOW (ref 4.22–5.81)
RDW: 17.3 % — ABNORMAL HIGH (ref 11.5–15.5)
WBC: 10.4 10*3/uL (ref 4.0–10.5)
nRBC: 0 % (ref 0.0–0.2)

## 2019-10-24 LAB — BASIC METABOLIC PANEL
Anion gap: 6 (ref 5–15)
BUN: 21 mg/dL (ref 8–23)
CO2: 35 mmol/L — ABNORMAL HIGH (ref 22–32)
Calcium: 8.6 mg/dL — ABNORMAL LOW (ref 8.9–10.3)
Chloride: 98 mmol/L (ref 98–111)
Creatinine, Ser: 1.04 mg/dL (ref 0.61–1.24)
GFR, Estimated: >60 mL/min (ref >60)
Glucose, Bld: 242 mg/dL — ABNORMAL HIGH (ref 70–99)
Potassium: 4.8 mmol/L (ref 3.5–5.1)
Sodium: 139 mmol/L (ref 135–145)

## 2019-10-24 LAB — GLUCOSE, CAPILLARY
Glucose-Capillary: 224 mg/dL — ABNORMAL HIGH (ref 70–99)
Glucose-Capillary: 238 mg/dL — ABNORMAL HIGH (ref 70–99)
Glucose-Capillary: 175 mg/dL — ABNORMAL HIGH (ref 70–99)
Glucose-Capillary: 198 mg/dL — ABNORMAL HIGH (ref 70–99)
Glucose-Capillary: 216 mg/dL — ABNORMAL HIGH (ref 70–99)
Glucose-Capillary: 182 mg/dL — ABNORMAL HIGH (ref 70–99)

## 2019-10-24 LAB — TRIGLYCERIDES: Triglycerides: 69 mg/dL (ref <150)

## 2019-10-24 LAB — MRSA PCR SCREENING: MRSA by PCR: NEGATIVE

## 2019-10-24 LAB — HEMOGLOBIN A1C
Hgb A1c MFr Bld: 6.5 % — ABNORMAL HIGH (ref 4.8–5.6)
Mean Plasma Glucose: 140 mg/dL

## 2019-10-24 LAB — STREP PNEUMONIAE URINARY ANTIGEN: Strep Pneumo Urinary Antigen: NEGATIVE

## 2019-10-24 LAB — PROCALCITONIN: Procalcitonin: 0.18 ng/mL

## 2019-10-24 MED ORDER — CHLORHEXIDINE GLUCONATE 0.12% ORAL RINSE (MEDLINE KIT)
15.0000 mL | Freq: Two times a day (BID) | OROMUCOSAL | Status: DC
  Administered 2019-10-24 – 2019-10-25 (×2): 15 mL via OROMUCOSAL

## 2019-10-24 MED ORDER — VITAL AF 1.2 CAL PO LIQD
1000.0000 mL | ORAL | Status: DC
  Administered 2019-10-24 (×2): 1000 mL

## 2019-10-24 MED ORDER — SODIUM CHLORIDE 0.9 % IV SOLN
1.0000 g | INTRAVENOUS | Status: DC
  Administered 2019-10-24 – 2019-10-27 (×3): 1 g via INTRAVENOUS
  Filled 2019-10-24 (×2): qty 1
  Filled 2019-10-24 (×2): qty 10
  Filled 2019-10-24: qty 1

## 2019-10-24 MED ORDER — AZITHROMYCIN 500 MG PO TABS
500.0000 mg | ORAL_TABLET | Freq: Every day | ORAL | Status: DC
  Administered 2019-10-24: 500 mg via ORAL
  Filled 2019-10-24 (×2): qty 1

## 2019-10-24 MED ORDER — FUROSEMIDE 10 MG/ML IJ SOLN
20.0000 mg | Freq: Two times a day (BID) | INTRAMUSCULAR | Status: DC
  Administered 2019-10-24 – 2019-10-27 (×6): 20 mg via INTRAVENOUS
  Filled 2019-10-24: qty 2
  Filled 2019-10-24: qty 4
  Filled 2019-10-24 (×3): qty 2
  Filled 2019-10-24: qty 4

## 2019-10-24 MED ORDER — AZITHROMYCIN 200 MG/5ML PO SUSR
500.0000 mg | Freq: Every day | ORAL | Status: DC
  Administered 2019-10-25: 500 mg
  Filled 2019-10-24 (×2): qty 12.5

## 2019-10-24 MED ORDER — ORAL CARE MOUTH RINSE
15.0000 mL | OROMUCOSAL | Status: DC
  Administered 2019-10-24 – 2019-10-25 (×6): 15 mL via OROMUCOSAL

## 2019-10-24 NOTE — Consult Note (Signed)
Consultation Note Date: 10/24/2019   Patient Name: Gregory Hall  DOB: 04/16/1955  MRN: [id number]  Age / Sex: 64 y.o., male  PCP: Enid Baas, MD Referring Physician: Erin Fulling, MD  Reason for Consultation: Establishing goals of care  HPI/Patient Profile: 64 y.o. male  with past medical history of COPD on 3 L O2 baseline, a fib, CAD, CHF, CKD, HTN, HLD, and chronic pain admitted on 10/22/2019 with AMS. Required intubation in ED. Being treated for COPD exacerbation and CHF exacerbation. Also with CAP. Requiring heavy sedation - agitated when sedation is decreased. Also requiring vasopressor support.  PMT consulted for GOC.  Clinical Assessment and Goals of Care: I have reviewed medical records including EPIC notes, labs and imaging, received report from RN, and assessed the patient.  Patient is heavily sedated - on propofol and fentanyl infusion. Also on levophed infusion. Received update from RN - did not tolerate SBT. Family (sisters) has not been at bedside but has called Charity fundraiser for updates.   I attempted to call sister Terri to further discuss GOC however she did not answer - voicemail left.   Reviewed palliative care notes - patient recently seen last month several times during previous hospitalization. Patient had been intubated during that admission as well. At that time, family had agreed to DNR. After extubation patient had stated he "never wanted to be on a ventilator again" per notes. He was considering hospice support but ultimately decided against hospice care outpatient d/t his group home not supportive of hospice.   Will continue attempts to further discuss GOC with family.   Primary Decision Maker NEXT OF KIN - sisters  SUMMARY OF RECOMMENDATIONS   - unable to reach family, patient sedated - see above for patient's previously expressed wishes - will continue attempts to discuss GOC with family  Code  Status/Advance Care Planning:  DNR  Prognosis:   Unable to determine  Discharge Planning: To Be Determined      Primary Diagnoses: Present on Admission: . Acute on chronic respiratory failure with hypoxia and hypercapnia (HCC)   I have reviewed the medical record, interviewed the patient and family, and examined the patient. The following aspects are pertinent.  Past Medical History:  Diagnosis Date  . AKI (acute kidney injury) (HCC) 09/12/2019  . CAD (coronary artery disease)    s/p PTCA and stent x2  . Chest pain   . CHF (congestive heart failure) (HCC)   . Chronic pain syndrome   . COPD (chronic obstructive pulmonary disease) (HCC)   . Degenerative cervical disc   . Depression   . Diabetes mellitus without complication (HCC)   . Dyslipidemia   . GERD (gastroesophageal reflux disease)   . Hernia 2014  . Hypertension   . MRSA (methicillin resistant staph aureus) culture positive 2011  . Neuropathy   . Nutcracker esophagus   . Rectus diastasis 07/19/2012   Social History   Socioeconomic History  . Marital status: Single    Spouse name: Not on file  . Number of children: Not on file  . Years of education: Not on file  . Highest education level: Not on file  Occupational History  . Not on file  Tobacco Use  . Smoking status: Current Every Day Smoker    Packs/day: 0.50    Years: 30.00    Pack years: 15.00  . Smokeless tobacco: Never Used  Substance and Sexual Activity  . Alcohol use: No  . Drug use: No  . Sexual activity: Not  on file  Other Topics Concern  . Not on file  Social History Narrative  . Not on file   Social Determinants of Health   Financial Resource Strain:   . Difficulty of Paying Living Expenses: Not on file  Food Insecurity:   . Worried About Programme researcher, broadcasting/film/video in the Last Year: Not on file  . Ran Out of Food in the Last Year: Not on file  Transportation Needs:   . Lack of Transportation (Medical): Not on file  . Lack of  Transportation (Non-Medical): Not on file  Physical Activity:   . Days of Exercise per Week: Not on file  . Minutes of Exercise per Session: Not on file  Stress:   . Feeling of Stress : Not on file  Social Connections:   . Frequency of Communication with Friends and Family: Not on file  . Frequency of Social Gatherings with Friends and Family: Not on file  . Attends Religious Services: Not on file  . Active Member of Clubs or Organizations: Not on file  . Attends Banker Meetings: Not on file  . Marital Status: Not on file   Family History  Family history unknown: Yes   Scheduled Meds: . azithromycin  500 mg Oral Daily  . budesonide (PULMICORT) nebulizer solution  0.5 mg Nebulization Daily  . Chlorhexidine Gluconate Cloth  6 each Topical Daily  . docusate  100 mg Per Tube BID  . feeding supplement (PROSource TF)  45 mL Per Tube BID  . furosemide  20 mg Intravenous BID  . insulin aspart  0-15 Units Subcutaneous Q4H  . ipratropium-albuterol  3 mL Nebulization Q6H  . methylPREDNISolone (SOLU-MEDROL) injection  40 mg Intravenous Q12H  . midodrine  10 mg Per Tube TID WC  . pantoprazole (PROTONIX) IV  40 mg Intravenous q1800  . polyethylene glycol  17 g Per Tube Daily  . rivaroxaban  20 mg Per Tube Q supper   Continuous Infusions: . sodium chloride 50 mL/hr at 10/24/19 0920  . sodium chloride Stopped (10/23/19 0416)  . cefTRIAXone (ROCEPHIN)  IV    . feeding supplement (VITAL AF 1.2 CAL) 1,000 mL (10/23/19 1222)  . fentaNYL infusion INTRAVENOUS 200 mcg/hr (10/23/19 2350)  . norepinephrine (LEVOPHED) Adult infusion 6 mcg/min (10/23/19 2349)  . propofol (DIPRIVAN) infusion 35 mcg/kg/min (10/24/19 0919)   PRN Meds:.acetaminophen (TYLENOL) oral liquid 160 mg/5 mL, HYDROmorphone (DILAUDID) injection, HYDROmorphone (DILAUDID) injection, ipratropium-albuterol, polyethylene glycol, sodium chloride flush Allergies  Allergen Reactions  . Acetaminophen Other (See Comments)     Reaction:  Unknown  Kidney failure Told not to take from home M.D. Related to kidney and renal failure   . Fentanyl   . Nsaids Other (See Comments)    Reaction:  Unknown  Kidney failure Patient states not to take from home M.D. Related to kidney and renal failure.  Other reaction(s): Unknown   Review of Systems  Unable to perform ROS: Intubated    Physical Exam Constitutional:      Comments: Intubated/sedated - not interactive  Cardiovascular:     Rate and Rhythm: Normal rate and regular rhythm.  Pulmonary:     Effort: Pulmonary effort is normal.  Abdominal:     Palpations: Abdomen is soft.  Musculoskeletal:     Right lower leg: No edema.     Left lower leg: No edema.  Skin:    General: Skin is warm and dry.     Vital Signs: BP (!) 117/57   Pulse  65   Temp 98.6 F (37 C)   Resp (!) 24   Ht 6' (1.829 m)   Wt 91.7 kg   SpO2 95%   BMI 27.42 kg/m  Pain Scale: CPOT   Pain Score: 0-No pain   SpO2: SpO2: 95 % O2 Device:SpO2: 95 % O2 Flow Rate: .O2 Flow Rate (L/min): 3 L/min  IO: Intake/output summary:   Intake/Output Summary (Last 24 hours) at 10/24/2019 1226 Last data filed at 10/23/2019 1848 Gross per 24 hour  Intake 917.67 ml  Output 350 ml  Net 567.67 ml    LBM:   Baseline Weight: Weight: 91.7 kg Most recent weight: Weight: 91.7 kg     Palliative Assessment/Data: PPS 30%    Time Total: 35 minutes Greater than 50%  of this time was spent counseling and coordinating care related to the above assessment and plan.  Gerlean Ren, DNP, AGNP-C Palliative Medicine Team [phone] Pager: [phone]

## 2019-10-24 NOTE — Progress Notes (Signed)
PHARMACY CONSULT NOTE - FOLLOW UP  Pharmacy Consult for Electrolyte Monitoring and Replacement   Recent Labs: Potassium (mmol/L)  Date Value  10/24/2019 4.8  03/31/2013 3.6   Magnesium (mg/dL)  Date Value  43/27/6147 1.7  06/25/2012 2.4   Calcium (mg/dL)  Date Value  10/07/5745 8.6 (L)   Calcium, Total (mg/dL)  Date Value  34/03/7094 7.6 (L)   Albumin (g/dL)  Date Value  43/83/8184 POSSIBLE CONTAMINATION  10/06/2019 3.9  03/30/2013 3.2 (L)   Phosphorus (mg/dL)  Date Value  03/75/4360 3.3  03/20/2012 3.4   Sodium (mmol/L)  Date Value  10/24/2019 139  10/17/2019 142  03/31/2013 142   Assessment: 64 year old male admitted with respiratory acidosis failing BiPAP and requiring intubation. Remains intubated and sedated in the ICU. Pharmacy consulted for electrolyte management.  Goal of Therapy:  Lytes WNL, K >/= 4  Plan:  No electrolyte replacement indicated. Continue to follow along.  Pricilla Riffle ,PharmD Clinical Pharmacist 10/24/2019 2:06 PM

## 2019-10-24 NOTE — Progress Notes (Signed)
Nutrition Brief Follow-Up Note   INTERVENTION:   Increase Vital 1.2 to 34ml/hr + Pro-Source 49ml BID via tube  Free water flushes 25ml q4 hours to maintain tube patency   Propofol: 19.26 ml/hr- provides 508kcal/day   Regimen provides 2316kcal/day, 130g/day protein and 1321ml/day free water   Estimated Nutritional Needs:   Kcal:  2375kcal/day  Protein:  120-140g/day  Fluid:  >2L/day  Betsey Holiday MS, RD, LDN Please refer to [facility] for RD and/or RD on-call/weekend/after hours pager

## 2019-10-24 NOTE — Progress Notes (Signed)
CRITICAL CARE NOTE 64 year old male admitted with acute metabolic encephalopathy and acute on chronic hypoxic hypercapnic respiratory failure secondary to COPD Exacerbation.  Failed trial of BiPAP requiring intubation in the ED.   Significant Hospital Events   10/13: Presented to ED due to altered mental status, required intubation due to worsening somnolence and unchanged ABG 10/14 remains intubated 10/15- on MV FiO2 40% on levophed 4, fentanyl and propofol to RASS-1, awakening trial failed with aggitation and encephalopathy.  DNR status. Palliative following.   Consults:  PCCM  Procedures:  10/13: Endotracheal intubation  Significant Diagnostic Tests:  10/13: Chest x-ray>>Portable AP upright view at 2050 hours. Stable lung volumes, mediastinal contours and thoracic spinal stimulator. Chronic lower lobe atelectasis and/or scarring. Chronic increased interstitial opacity in both lungs. Lung markings today appear at baseline. No pneumothorax or pleural effusion. Visualized tracheal air column is within normal limits. Partially visible cervical ACDF. No acute osseous abnormality identified. 10/13: CT head>>1. No acute intracranial pathology. 2. Paranasal sinus disease.  Micro Data:  10/13: SARS-CoV-2>> negative 10/13: Influenza>> negative 10/13: Respiratory Viral Panel>> 10/14: Tracheal aspirate>>      CC  follow up respiratory failure  SUBJECTIVE Patient remains critically ill Prognosis is guarded   BP (!) 110/51   Pulse 78   Temp 98.1 F (36.7 C) (Oral)   Resp (!) 26   Ht 6' (1.829 m)   Wt 91.7 kg   SpO2 93%   BMI 27.42 kg/m    I/O last 3 completed shifts: In: 917.7 [I.V.:917.7] Out: 1550 [Urine:1550] No intake/output data recorded.  SpO2: 93 % O2 Flow Rate (L/min): 3 L/min FiO2 (%): 40 %  Estimated body mass index is 27.42 kg/m as calculated from the following:   Height as of this encounter: 6' (1.829 m).   Weight as of this encounter: 91.7  kg.    Vent Mode: PRVC FiO2 (%):  [40 %] 40 % Set Rate:  [24 bmp] 24 bmp Vt Set:  [500 mL] 500 mL PEEP:  [5 cmH20] 5 cmH20 Plateau Pressure:  [17 cmH20] 17 cmH20 CBC    Component Value Date/Time   WBC 10.4 10/24/2019 0415   RBC 3.57 (L) 10/24/2019 0415   HGB 9.3 (L) 10/24/2019 0415   HGB 10.4 (L) 10/17/2019 1013   HCT 31.5 (L) 10/24/2019 0415   HCT 33.5 (L) 10/17/2019 1013   PLT 257 10/24/2019 0415   PLT 280 10/17/2019 1013   MCV 88.2 10/24/2019 0415   MCV 87 10/17/2019 1013   MCV 97 03/31/2013 0224   MCH 26.1 10/24/2019 0415   MCHC 29.5 (L) 10/24/2019 0415   RDW 17.3 (H) 10/24/2019 0415   RDW 16.0 (H) 10/17/2019 1013   RDW 14.0 03/31/2013 0224   LYMPHSABS 0.6 (L) 10/24/2019 0415   LYMPHSABS 2.3 10/06/2019 0829   LYMPHSABS 2.8 03/31/2013 0224   MONOABS 0.6 10/24/2019 0415   MONOABS 0.6 03/31/2013 0224   EOSABS 0.0 10/24/2019 0415   EOSABS 0.3 10/06/2019 0829   EOSABS 0.3 03/31/2013 0224   BASOSABS 0.0 10/24/2019 0415   BASOSABS 0.1 10/06/2019 0829   BASOSABS 0.1 03/31/2013 0224   BMP Latest Ref Rng & Units 10/24/2019 10/23/2019 10/23/2019  Glucose 70 - 99 mg/dL 161(W) 960(A) POSSIBLE CONTAMINATION  BUN 8 - 23 mg/dL 21 13 POSSIBLE CONTAMINATION  Creatinine 0.61 - 1.24 mg/dL 5.40 9.81 POSSIBLE CONTAMINATION  BUN/Creat Ratio 10 - 24 - - -  Sodium 135 - 145 mmol/L 139 142 POSSIBLE CONTAMINATION  Potassium 3.5 - 5.1 mmol/L 4.8  4.7 POSSIBLE CONTAMINATION  Chloride 98 - 111 mmol/L 98 98 POSSIBLE CONTAMINATION  CO2 22 - 32 mmol/L 35(H) 35(H) POSSIBLE CONTAMINATION  Calcium 8.9 - 10.3 mg/dL 0.8(M) 5.7(Q) POSSIBLE CONTAMINATION    SIGNIFICANT EVENTS   REVIEW OF SYSTEMS  PATIENT IS UNABLE TO PROVIDE COMPLETE REVIEW OF SYSTEMS DUE TO SEVERE CRITICAL ILLNESS        PHYSICAL EXAMINATION:  GENERAL:Sedated age approporate NECK: Supple.  PULMONARY: +rhonchi  b/l CARDIOVASCULAR: S1 and S2. Regular rate and rhythm. No murmurs, rubs, or gallops.  GASTROINTESTINAL:  Soft, nontender, -distended.  Positive bowel sounds.   MUSCULOSKELETAL: 2+ edema swelling of LE ,NO clubbing NEUROLOGIC: obtunded, GCS4T SKIN:intact,warm,dry  MEDICATIONS: I have reviewed all medications and confirmed regimen as documented   CULTURE RESULTS   Recent Results (from the past 240 hour(s))  Respiratory Panel by RT PCR (Flu A&B, Covid) - Nasopharyngeal Swab     Status: None   Collection Time: 10/22/19  9:22 PM   Specimen: Nasopharyngeal Swab  Result Value Ref Range Status   SARS Coronavirus 2 by RT PCR NEGATIVE NEGATIVE Final    Comment: (NOTE) SARS-CoV-2 target nucleic acids are NOT DETECTED.  The SARS-CoV-2 RNA is generally detectable in upper respiratoy specimens during the acute phase of infection. The lowest concentration of SARS-CoV-2 viral copies this assay can detect is 131 copies/mL. A negative result does not preclude SARS-Cov-2 infection and should not be used as the sole basis for treatment or other patient management decisions. A negative result may occur with  improper specimen collection/handling, submission of specimen other than nasopharyngeal swab, presence of viral mutation(s) within the areas targeted by this assay, and inadequate number of viral copies (<131 copies/mL). A negative result must be combined with clinical observations, patient history, and epidemiological information. The expected result is Negative.  Fact Sheet for Patients:  https://www.moore.com/  Fact Sheet for Healthcare Providers:  https://www.young.biz/  This test is no t yet approved or cleared by the [place] FDA and  has been authorized for detection and/or diagnosis of SARS-CoV-2 by FDA under an Emergency Use Authorization (EUA). This EUA will remain  in effect (meaning this test can be used) for the duration of the COVID-19 declaration under Section 564(b)(1) of the Act, 21 U.S.C. section 360bbb-3(b)(1), unless the  authorization is terminated or revoked sooner.     Influenza A by PCR NEGATIVE NEGATIVE Final   Influenza B by PCR NEGATIVE NEGATIVE Final    Comment: (NOTE) The Xpert Xpress SARS-CoV-2/FLU/RSV assay is intended as an aid in  the diagnosis of influenza from Nasopharyngeal swab specimens and  should not be used as a sole basis for treatment. Nasal washings and  aspirates are unacceptable for Xpert Xpress SARS-CoV-2/FLU/RSV  testing.  Fact Sheet for Patients: https://www.moore.com/  Fact Sheet for Healthcare Providers: https://www.young.biz/  This test is not yet approved or cleared by the [place] FDA and  has been authorized for detection and/or diagnosis of SARS-CoV-2 by  FDA under an Emergency Use Authorization (EUA). This EUA will remain  in effect (meaning this test can be used) for the duration of the  Covid-19 declaration under Section 564(b)(1) of the Act, 21  U.S.C. section 360bbb-3(b)(1), unless the authorization is  terminated or revoked. Performed at [facility], [address] [place], [place] [address]   Respiratory Panel by PCR     Status: None   Collection Time: 10/23/19 12:02 AM   Specimen: Nasopharyngeal Swab; Respiratory  Result Value Ref Range Status  Adenovirus NOT DETECTED NOT DETECTED Final   Coronavirus 229E NOT DETECTED NOT DETECTED Final    Comment: (NOTE) The Coronavirus on the Respiratory Panel, DOES NOT test for the novel  Coronavirus (2019 nCoV)    Coronavirus HKU1 NOT DETECTED NOT DETECTED Final   Coronavirus NL63 NOT DETECTED NOT DETECTED Final   Coronavirus OC43 NOT DETECTED NOT DETECTED Final   Metapneumovirus NOT DETECTED NOT DETECTED Final   Rhinovirus / Enterovirus NOT DETECTED NOT DETECTED Final   Influenza A NOT DETECTED NOT DETECTED Final   Influenza B NOT DETECTED NOT DETECTED Final   Parainfluenza Virus 1 NOT DETECTED NOT DETECTED Final   Parainfluenza Virus 2 NOT DETECTED  NOT DETECTED Final   Parainfluenza Virus 3 NOT DETECTED NOT DETECTED Final   Parainfluenza Virus 4 NOT DETECTED NOT DETECTED Final   Respiratory Syncytial Virus NOT DETECTED NOT DETECTED Final   Bordetella pertussis NOT DETECTED NOT DETECTED Final   Chlamydophila pneumoniae NOT DETECTED NOT DETECTED Final   Mycoplasma pneumoniae NOT DETECTED NOT DETECTED Final    Comment: Performed at [facility], [address] [place], [place] [address]          IMAGING    DG Chest Port 1 View  Result Date: 10/24/2019 CLINICAL DATA:  Acute respiratory failure EXAM: PORTABLE CHEST 1 VIEW COMPARISON:  10/22/2019 FINDINGS: Endotracheal tube is seen 6.5 cm above the carina. Nasogastric tube extends into the upper abdomen with its proximal side hole seen within the proximal stomach. The lungs are symmetrically well expanded. Mild left basilar atelectasis persists. Developing right basilar atelectasis or infiltrate is seen. No pneumothorax. Cardiac size within normal limits. Pulmonary vascularity is normal. No acute bone abnormality. Dorsal column stimulator leads are unchanged. IMPRESSION: Developing right basilar atelectasis or infiltrate. Stable left basilar atelectasis. Stable support tubes. Electronically Signed   By: Helyn Numbers MD   On: 10/24/2019 02:44     Nutrition Status: Nutrition Problem: Inadequate oral intake Etiology: inability to eat (pt sedated and ventilated) Signs/Symptoms: NPO status Interventions: Tube feeding     Indwelling Urinary Catheter continued, requirement due to   Reason to continue Indwelling Urinary Catheter strict Intake/Output monitoring for hemodynamic instability         Ventilator continued, requirement due to severe respiratory failure   Ventilator Sedation RASS 0 to -2        ASSESSMENT AND PLAN    Severe ACUTE Hypoxic and Hypercapnic Respiratory Failure due to acute COPD exacerbation with acute HFpEF CHF exacerbation   -with concomitant  CAP suspect GPC such as strep pneumoniae , present on admission - empirically on Rocephin and zithromax   - repeat tracheal aspirate for micorbiology    - follow trend procal->>0.12>>0.18   - streptococal urinary antigen->>net -600>>   - legionella urinary ag   - patient with staph epidermitis blood culture +last month 09/2019   - monitor fluid balance    -fungitell to rule out fungal etiology   - solumedrol 40 IV BID per COPD carepath   -lasix 20 bid due to A/C HFpEF -NIV qualification for home use due to chronic CO2 retention     Severe Acute Exacerbation of COPD  - continue with COPD carepath   - solumedrol 40 bid   - antimicrobials per CAP therapy    - pharmD consultation    - recruitement maneuvers while on MV   Acute on chronic heart failure with preserved EF -TTE 09/14/2019 - Grade 2 diastolic CHF  -monitor UOP  -  gentle diuresis due to concomitant pneumonia  lasix 20 bid    Severe hyperglycemia  - partially due to systemic steroids - increase AC/HS ISS  -monitor POC q4h -diabetic coordinator monitoring   ID -continue IV abx as prescibed -follow up cultures  GI GI PROPHYLAXIS as indicated  NUTRITIONAL STATUS Nutrition Status: Nutrition Problem: Inadequate oral intake Etiology: inability to eat (pt sedated and ventilated) Signs/Symptoms: NPO status Interventions: Tube feeding   DIET-->TF's as tolerated Constipation protocol as indicated  ENDO - will use ICU hypoglycemic\Hyperglycemia protocol if indicated     ELECTROLYTES -follow labs as needed -replace as needed -pharmacy consultation and following   DVT/GI PRX ordered and assessed TRANSFUSIONS AS NEEDED MONITOR FSBS I Assessed the need for Labs I Assessed the need for Foley I Assessed the need for Central Venous Line Family Discussion when available I Assessed the need for Mobilization I made an Assessment of medications to be adjusted accordingly Safety Risk assessment completed   CASE  DISCUSSED IN MULTIDISCIPLINARY ROUNDS WITH ICU TEAM  Critical Care Time devoted to patient care services described in this note is 33 minutes.   Overall, patient is critically ill, prognosis is guarded.     Vida Rigger, M.D.  Pulmonary & Critical Care Medicine  [facility]

## 2019-10-24 NOTE — Progress Notes (Signed)
Inpatient Diabetes Program Recommendations  AACE/ADA: New Consensus Statement on Inpatient Glycemic Control (2015)  Target Ranges:  Prepandial:   less than 140 mg/dL      Peak postprandial:   less than 180 mg/dL (1-2 hours)      Critically ill patients:  140 - 180 mg/dL   Lab Results  Component Value Date   GLUCAP 175 (H) 10/24/2019   HGBA1C 6.5 (H) 10/23/2019    Review of Glycemic Control Results for Gregory Hall, Gregory Hall (MRN [id number]) as of 10/24/2019 13:05  Ref. Range 10/23/2019 21:12 10/23/2019 23:34 10/24/2019 04:27 10/24/2019 07:25 10/24/2019 12:01  Glucose-Capillary Latest Ref Range: 70 - 99 mg/dL 300 (H) 762 (H) 263 (H) 238 (H) 175 (H)   Diabetes history: DM 2 Outpatient Diabetes medications:  Lantus 22 units q HS, Novolog 4 units tid with meals, Metformin 750 mg bid Current orders for Inpatient glycemic control:  Novolog moderate q 4 hours Vital 55 ml/hr  Inpatient Diabetes Program Recommendations:    If appropriate, consider adding Levemir 8 units bid.    Thanks  Beryl Meager, RN, BC-ADM Inpatient Diabetes Coordinator Pager [phone] (8a-5p)

## 2019-10-25 LAB — BASIC METABOLIC PANEL
Anion gap: 7 (ref 5–15)
BUN: 26 mg/dL — ABNORMAL HIGH (ref 8–23)
CO2: 36 mmol/L — ABNORMAL HIGH (ref 22–32)
Calcium: 8.9 mg/dL (ref 8.9–10.3)
Chloride: 97 mmol/L — ABNORMAL LOW (ref 98–111)
Creatinine, Ser: 0.9 mg/dL (ref 0.61–1.24)
GFR, Estimated: >60 mL/min (ref >60)
Glucose, Bld: 232 mg/dL — ABNORMAL HIGH (ref 70–99)
Potassium: 5.1 mmol/L (ref 3.5–5.1)
Sodium: 140 mmol/L (ref 135–145)

## 2019-10-25 LAB — GLUCOSE, CAPILLARY
Glucose-Capillary: 157 mg/dL — ABNORMAL HIGH (ref 70–99)
Glucose-Capillary: 166 mg/dL — ABNORMAL HIGH (ref 70–99)
Glucose-Capillary: 175 mg/dL — ABNORMAL HIGH (ref 70–99)
Glucose-Capillary: 211 mg/dL — ABNORMAL HIGH (ref 70–99)
Glucose-Capillary: 229 mg/dL — ABNORMAL HIGH (ref 70–99)
Glucose-Capillary: 260 mg/dL — ABNORMAL HIGH (ref 70–99)

## 2019-10-25 LAB — CBC WITH DIFFERENTIAL/PLATELET
Abs Immature Granulocytes: 0.06 10*3/uL (ref 0.00–0.07)
Basophils Absolute: 0 10*3/uL (ref 0.0–0.1)
Basophils Relative: 0 %
Eosinophils Absolute: 0 10*3/uL (ref 0.0–0.5)
Eosinophils Relative: 0 %
HCT: 31.1 % — ABNORMAL LOW (ref 39.0–52.0)
Hemoglobin: 9.4 g/dL — ABNORMAL LOW (ref 13.0–17.0)
Immature Granulocytes: 1 %
Lymphocytes Relative: 6 %
Lymphs Abs: 0.5 10*3/uL — ABNORMAL LOW (ref 0.7–4.0)
MCH: 26.6 pg (ref 26.0–34.0)
MCHC: 30.2 g/dL (ref 30.0–36.0)
MCV: 87.9 fL (ref 80.0–100.0)
Monocytes Absolute: 0.5 10*3/uL (ref 0.1–1.0)
Monocytes Relative: 5 %
Neutro Abs: 8.2 10*3/uL — ABNORMAL HIGH (ref 1.7–7.7)
Neutrophils Relative %: 88 %
Platelets: 211 10*3/uL (ref 150–400)
RBC: 3.54 MIL/uL — ABNORMAL LOW (ref 4.22–5.81)
RDW: 17.8 % — ABNORMAL HIGH (ref 11.5–15.5)
WBC: 9.3 10*3/uL (ref 4.0–10.5)
nRBC: 0 % (ref 0.0–0.2)

## 2019-10-25 LAB — MAGNESIUM: Magnesium: 2.3 mg/dL (ref 1.7–2.4)

## 2019-10-25 LAB — PROCALCITONIN: Procalcitonin: 0.18 ng/mL

## 2019-10-25 LAB — TRIGLYCERIDES: Triglycerides: 67 mg/dL (ref <150)

## 2019-10-25 LAB — PHOSPHORUS: Phosphorus: 4.4 mg/dL (ref 2.5–4.6)

## 2019-10-25 MED ORDER — AZITHROMYCIN 200 MG/5ML PO SUSR
500.0000 mg | Freq: Every day | ORAL | Status: DC
  Administered 2019-10-26 – 2019-10-27 (×2): 500 mg via ORAL
  Filled 2019-10-25 (×2): qty 12.5

## 2019-10-25 MED ORDER — HYDROMORPHONE HCL 1 MG/ML IJ SOLN
1.0000 mg | INTRAMUSCULAR | Status: DC | PRN
  Administered 2019-10-25 (×2): 1 mg via INTRAVENOUS
  Filled 2019-10-25 (×3): qty 1

## 2019-10-25 MED ORDER — MORPHINE SULFATE (PF) 2 MG/ML IV SOLN
INTRAVENOUS | Status: AC
  Filled 2019-10-25: qty 1

## 2019-10-25 MED ORDER — DIPHENHYDRAMINE HCL 50 MG/ML IJ SOLN
50.0000 mg | Freq: Three times a day (TID) | INTRAMUSCULAR | Status: DC
  Administered 2019-10-25 – 2019-10-27 (×6): 50 mg via INTRAVENOUS
  Filled 2019-10-25 (×7): qty 1

## 2019-10-25 MED ORDER — CHLORHEXIDINE GLUCONATE 0.12 % MT SOLN
15.0000 mL | Freq: Two times a day (BID) | OROMUCOSAL | Status: DC
  Administered 2019-10-25 – 2019-10-27 (×3): 15 mL via OROMUCOSAL
  Filled 2019-10-25 (×3): qty 15

## 2019-10-25 MED ORDER — MORPHINE SULFATE (PF) 2 MG/ML IV SOLN
1.0000 mg | Freq: Once | INTRAVENOUS | Status: AC
  Administered 2019-10-25: 1 mg via INTRAVENOUS

## 2019-10-25 MED ORDER — DOCUSATE SODIUM 50 MG/5ML PO LIQD
100.0000 mg | Freq: Two times a day (BID) | ORAL | Status: DC
  Administered 2019-10-27: 100 mg via ORAL
  Filled 2019-10-25 (×6): qty 10

## 2019-10-25 MED ORDER — ONDANSETRON HCL 4 MG/2ML IJ SOLN
4.0000 mg | Freq: Four times a day (QID) | INTRAMUSCULAR | Status: DC | PRN
  Administered 2019-10-25 – 2019-10-27 (×2): 4 mg via INTRAVENOUS
  Filled 2019-10-25 (×2): qty 2

## 2019-10-25 MED ORDER — HYDROMORPHONE HCL 1 MG/ML IJ SOLN
1.0000 mg | INTRAMUSCULAR | Status: AC
  Administered 2019-10-25: 1 mg via INTRAVENOUS
  Filled 2019-10-25: qty 1

## 2019-10-25 MED ORDER — POLYETHYLENE GLYCOL 3350 17 G PO PACK
17.0000 g | PACK | Freq: Every day | ORAL | Status: DC
  Administered 2019-10-27: 17 g via ORAL
  Filled 2019-10-25 (×2): qty 1

## 2019-10-25 MED ORDER — HYDROMORPHONE HCL 1 MG/ML IJ SOLN
1.0000 mg | INTRAMUSCULAR | Status: DC | PRN
  Administered 2019-10-26: 1 mg via INTRAVENOUS
  Administered 2019-10-26: 2 mg via INTRAVENOUS
  Filled 2019-10-25: qty 1
  Filled 2019-10-25: qty 2

## 2019-10-25 MED ORDER — FUROSEMIDE 10 MG/ML IJ SOLN
40.0000 mg | Freq: Once | INTRAMUSCULAR | Status: AC
  Administered 2019-10-25: 40 mg via INTRAVENOUS
  Filled 2019-10-25: qty 4

## 2019-10-25 MED ORDER — DIPHENHYDRAMINE HCL 50 MG/ML IJ SOLN
INTRAMUSCULAR | Status: AC
  Filled 2019-10-25: qty 1

## 2019-10-25 MED ORDER — TRAZODONE HCL 50 MG PO TABS
50.0000 mg | ORAL_TABLET | Freq: Once | ORAL | Status: AC
  Administered 2019-10-25: 50 mg via ORAL
  Filled 2019-10-25: qty 1

## 2019-10-25 MED ORDER — DIPHENHYDRAMINE HCL 50 MG/ML IJ SOLN
50.0000 mg | Freq: Once | INTRAMUSCULAR | Status: AC
  Administered 2019-10-25: 50 mg via INTRAVENOUS

## 2019-10-25 MED ORDER — ORAL CARE MOUTH RINSE
15.0000 mL | Freq: Two times a day (BID) | OROMUCOSAL | Status: DC
  Administered 2019-10-25 – 2019-10-27 (×3): 15 mL via OROMUCOSAL

## 2019-10-25 MED ORDER — HYDROMORPHONE HCL 1 MG/ML IJ SOLN
1.0000 mg | Freq: Once | INTRAMUSCULAR | Status: AC
  Administered 2019-10-25: 1 mg via INTRAVENOUS

## 2019-10-25 MED ORDER — MORPHINE SULFATE (PF) 2 MG/ML IV SOLN
1.0000 mg | Freq: Once | INTRAVENOUS | Status: AC
  Administered 2019-10-25: 1 mg via INTRAVENOUS
  Filled 2019-10-25: qty 1

## 2019-10-25 MED ORDER — MIDODRINE HCL 5 MG PO TABS: 10.0000 mg | ORAL_TABLET | Freq: Three times a day (TID) | ORAL | Status: DC

## 2019-10-25 MED ORDER — OXYCODONE HCL 5 MG PO TABS
10.0000 mg | ORAL_TABLET | Freq: Four times a day (QID) | ORAL | Status: DC | PRN
  Administered 2019-10-25 – 2019-10-26 (×3): 10 mg via ORAL
  Filled 2019-10-25 (×3): qty 2

## 2019-10-25 NOTE — Progress Notes (Signed)
PHARMACY CONSULT NOTE - FOLLOW UP  Pharmacy Consult for Electrolyte Monitoring and Replacement   Recent Labs: Potassium (mmol/L)  Date Value  10/25/2019 5.1  03/31/2013 3.6   Magnesium (mg/dL)  Date Value  70/35/0093 2.3  06/25/2012 2.4   Calcium (mg/dL)  Date Value  81/82/9937 8.9   Calcium, Total (mg/dL)  Date Value  16/96/7893 7.6 (L)   Albumin (g/dL)  Date Value  81/01/7508 POSSIBLE CONTAMINATION  10/06/2019 3.9  03/30/2013 3.2 (L)   Phosphorus (mg/dL)  Date Value  25/85/2778 4.4  03/20/2012 3.4   Sodium (mmol/L)  Date Value  10/25/2019 140  10/17/2019 142  03/31/2013 142   Assessment: 64 year old male admitted with respiratory acidosis failing BiPAP and requiring intubation. Remains intubated and sedated in the ICU. Pharmacy consulted for electrolyte management.  Goal of Therapy:  Lytes WNL, K >/= 4  Plan:  Patient on lasix 20 mg IV bid. No electrolyte replacement indicated. Continue to follow along.  Angelique Blonder ,PharmD Clinical Pharmacist 10/25/2019 8:24 AM

## 2019-10-25 NOTE — Progress Notes (Signed)
CRITICAL CARE NOTE 64 year old male admitted with acute metabolic encephalopathy and acute on chronic hypoxic hypercapnic respiratory failure secondary to COPD Exacerbation.  Failed trial of BiPAP requiring intubation in the ED.   Significant Hospital Events   10/13: Presented to ED due to altered mental status, required intubation due to worsening somnolence and unchanged ABG 10/14 remains intubated 10/15- on MV FiO2 40% on levophed 4, fentanyl and propofol to RASS-1, awakening trial failed with aggitation and encephalopathy.  DNR status. Palliative following.  10/16- patient improved. He is liberated off mechanical ventilator.  He is asking for high dose narcotics even after 2 doses of po oxycontin. Delivered IV morphine twice and patient seems to be more calm now.   Consults:  PCCM  Procedures:  10/13: Endotracheal intubation  Significant Diagnostic Tests:  10/13: Chest x-ray>>Portable AP upright view at 2050 hours. Stable lung volumes, mediastinal contours and thoracic spinal stimulator. Chronic lower lobe atelectasis and/or scarring. Chronic increased interstitial opacity in both lungs. Lung markings today appear at baseline. No pneumothorax or pleural effusion. Visualized tracheal air column is within normal limits. Partially visible cervical ACDF. No acute osseous abnormality identified. 10/13: CT head>>1. No acute intracranial pathology. 2. Paranasal sinus disease.  Micro Data:  10/13: SARS-CoV-2>> negative 10/13: Influenza>> negative 10/13: Respiratory Viral Panel>> 10/14: Tracheal aspirate>>      CC  follow up respiratory failure  SUBJECTIVE Patient remains critically ill Prognosis is guarded   BP (!) 169/78   Pulse 76   Temp 98.2 F (36.8 C) (Axillary)   Resp 16   Ht 6' (1.829 m)   Wt 92.4 kg   SpO2 95%   BMI 27.63 kg/m    I/O last 3 completed shifts: In: 5184.9 [I.V.:2749.5; NG/GT:2335.4; IV Piggyback:100] Out: 1550 [Urine:1550] Total  I/O In: 1039.8 [I.V.:790.9; NG/GT:148.9; IV Piggyback:100] Out: 1750 [Urine:1750]  SpO2: 95 % O2 Flow Rate (L/min): 10 L/min FiO2 (%): 35 %  Estimated body mass index is 27.63 kg/m as calculated from the following:   Height as of this encounter: 6' (1.829 m).   Weight as of this encounter: 92.4 kg.    Vent Mode: PSV;CPAP FiO2 (%):  [35 %-40 %] 35 % Set Rate:  [24 bmp] 24 bmp Vt Set:  [500 mL] 500 mL PEEP:  [5 cmH20] 5 cmH20 Pressure Support:  [5 cmH20] 5 cmH20 CBC    Component Value Date/Time   WBC 9.3 10/25/2019 0505   RBC 3.54 (L) 10/25/2019 0505   HGB 9.4 (L) 10/25/2019 0505   HGB 10.4 (L) 10/17/2019 1013   HCT 31.1 (L) 10/25/2019 0505   HCT 33.5 (L) 10/17/2019 1013   PLT 211 10/25/2019 0505   PLT 280 10/17/2019 1013   MCV 87.9 10/25/2019 0505   MCV 87 10/17/2019 1013   MCV 97 03/31/2013 0224   MCH 26.6 10/25/2019 0505   MCHC 30.2 10/25/2019 0505   RDW 17.8 (H) 10/25/2019 0505   RDW 16.0 (H) 10/17/2019 1013   RDW 14.0 03/31/2013 0224   LYMPHSABS 0.5 (L) 10/25/2019 0505   LYMPHSABS 2.3 10/06/2019 0829   LYMPHSABS 2.8 03/31/2013 0224   MONOABS 0.5 10/25/2019 0505   MONOABS 0.6 03/31/2013 0224   EOSABS 0.0 10/25/2019 0505   EOSABS 0.3 10/06/2019 0829   EOSABS 0.3 03/31/2013 0224   BASOSABS 0.0 10/25/2019 0505   BASOSABS 0.1 10/06/2019 0829   BASOSABS 0.1 03/31/2013 0224   BMP Latest Ref Rng & Units 10/25/2019 10/24/2019 10/23/2019  Glucose 70 - 99 mg/dL 734(L) 937(T) 024(O)  BUN 8 - 23 mg/dL 95(J) 21 13  Creatinine 0.61 - 1.24 mg/dL 8.84 1.66 0.63  BUN/Creat Ratio 10 - 24 - - -  Sodium 135 - 145 mmol/L 140 139 142  Potassium 3.5 - 5.1 mmol/L 5.1 4.8 4.7  Chloride 98 - 111 mmol/L 97(L) 98 98  CO2 22 - 32 mmol/L 36(H) 35(H) 35(H)  Calcium 8.9 - 10.3 mg/dL 8.9 0.1(S) 0.1(U)    SIGNIFICANT EVENTS   REVIEW OF SYSTEMS  PATIENT IS UNABLE TO PROVIDE COMPLETE REVIEW OF SYSTEMS DUE TO SEVERE CRITICAL ILLNESS        PHYSICAL  EXAMINATION:  GENERAL:Sedated age approporate NECK: Supple.  PULMONARY: +rhonchi  b/l CARDIOVASCULAR: S1 and S2. Regular rate and rhythm. No murmurs, rubs, or gallops.  GASTROINTESTINAL: Soft, nontender, -distended.  Positive bowel sounds.   MUSCULOSKELETAL: 2+ edema swelling of LE ,NO clubbing NEUROLOGIC: obtunded, GCS4T SKIN:intact,warm,dry  MEDICATIONS: I have reviewed all medications and confirmed regimen as documented   CULTURE RESULTS   Recent Results (from the past 240 hour(s))  Respiratory Panel by RT PCR (Flu A&B, Covid) - Nasopharyngeal Swab     Status: None   Collection Time: 10/22/19  9:22 PM   Specimen: Nasopharyngeal Swab  Result Value Ref Range Status   SARS Coronavirus 2 by RT PCR NEGATIVE NEGATIVE Final    Comment: (NOTE) SARS-CoV-2 target nucleic acids are NOT DETECTED.  The SARS-CoV-2 RNA is generally detectable in upper respiratoy specimens during the acute phase of infection. The lowest concentration of SARS-CoV-2 viral copies this assay can detect is 131 copies/mL. A negative result does not preclude SARS-Cov-2 infection and should not be used as the sole basis for treatment or other patient management decisions. A negative result may occur with  improper specimen collection/handling, submission of specimen other than nasopharyngeal swab, presence of viral mutation(s) within the areas targeted by this assay, and inadequate number of viral copies (<131 copies/mL). A negative result must be combined with clinical observations, patient history, and epidemiological information. The expected result is Negative.  Fact Sheet for Patients:  https://www.moore.com/  Fact Sheet for Healthcare Providers:  https://www.young.biz/  This test is no t yet approved or cleared by the [place] FDA and  has been authorized for detection and/or diagnosis of SARS-CoV-2 by FDA under an Emergency Use Authorization (EUA). This EUA  will remain  in effect (meaning this test can be used) for the duration of the COVID-19 declaration under Section 564(b)(1) of the Act, 21 U.S.C. section 360bbb-3(b)(1), unless the authorization is terminated or revoked sooner.     Influenza A by PCR NEGATIVE NEGATIVE Final   Influenza B by PCR NEGATIVE NEGATIVE Final    Comment: (NOTE) The Xpert Xpress SARS-CoV-2/FLU/RSV assay is intended as an aid in  the diagnosis of influenza from Nasopharyngeal swab specimens and  should not be used as a sole basis for treatment. Nasal washings and  aspirates are unacceptable for Xpert Xpress SARS-CoV-2/FLU/RSV  testing.  Fact Sheet for Patients: https://www.moore.com/  Fact Sheet for Healthcare Providers: https://www.young.biz/  This test is not yet approved or cleared by the [place] FDA and  has been authorized for detection and/or diagnosis of SARS-CoV-2 by  FDA under an Emergency Use Authorization (EUA). This EUA will remain  in effect (meaning this test can be used) for the duration of the  Covid-19 declaration under Section 564(b)(1) of the Act, 21  U.S.C. section 360bbb-3(b)(1), unless the authorization is  terminated or revoked. Performed at [facility], [address] [place], [place] [address]   Respiratory Panel by PCR     Status: None   Collection Time: 10/23/19 12:02 AM   Specimen: Nasopharyngeal Swab; Respiratory  Result Value Ref Range Status   Adenovirus NOT DETECTED NOT DETECTED Final   Coronavirus 229E NOT DETECTED NOT DETECTED Final    Comment: (NOTE) The Coronavirus on the Respiratory Panel, DOES NOT test for the novel  Coronavirus (2019 nCoV)    Coronavirus HKU1 NOT DETECTED NOT DETECTED Final   Coronavirus NL63 NOT DETECTED NOT DETECTED Final   Coronavirus OC43 NOT DETECTED NOT DETECTED Final   Metapneumovirus NOT DETECTED NOT DETECTED Final   Rhinovirus / Enterovirus NOT DETECTED NOT DETECTED Final    Influenza A NOT DETECTED NOT DETECTED Final   Influenza B NOT DETECTED NOT DETECTED Final   Parainfluenza Virus 1 NOT DETECTED NOT DETECTED Final   Parainfluenza Virus 2 NOT DETECTED NOT DETECTED Final   Parainfluenza Virus 3 NOT DETECTED NOT DETECTED Final   Parainfluenza Virus 4 NOT DETECTED NOT DETECTED Final   Respiratory Syncytial Virus NOT DETECTED NOT DETECTED Final   Bordetella pertussis NOT DETECTED NOT DETECTED Final   Chlamydophila pneumoniae NOT DETECTED NOT DETECTED Final   Mycoplasma pneumoniae NOT DETECTED NOT DETECTED Final    Comment: Performed at [facility], [address] [place], [place] [address]  Culture, respiratory     Status: None (Preliminary result)   Collection Time: 10/24/19  5:04 PM   Specimen: Tracheal Aspirate; Respiratory  Result Value Ref Range Status   Specimen Description   Final    TRACHEAL ASPIRATE Performed at [facility], [address] [place], [place] [address]    Special Requests   Final    NONE Performed at [facility], [address] [place], [place] [address]    Gram Stain   Final    FEW WBC PRESENT, PREDOMINANTLY PMN ABUNDANT GRAM NEGATIVE RODS    Culture   Final    CULTURE REINCUBATED FOR BETTER GROWTH Performed at [facility], [address], [place], [place] [address]    Report Status PENDING  Incomplete  MRSA PCR Screening     Status: None   Collection Time: 10/24/19  8:53 PM   Specimen: Nasopharyngeal  Result Value Ref Range Status   MRSA by PCR NEGATIVE NEGATIVE Final    Comment:        The GeneXpert MRSA Assay (FDA approved for NASAL specimens only), is one component of a comprehensive MRSA colonization surveillance program. It is not intended to diagnose MRSA infection nor to guide or monitor treatment for MRSA infections. Performed at [facility], [address], [place], [place] [address]           IMAGING    No results found.   Nutrition  Status: Nutrition Problem: Inadequate oral intake Etiology: inability to eat (pt sedated and ventilated) Signs/Symptoms: NPO status Interventions: Tube feeding     Indwelling Urinary Catheter continued, requirement due to   Reason to continue Indwelling Urinary Catheter strict Intake/Output monitoring for hemodynamic instability         Ventilator continued, requirement due to severe respiratory failure   Ventilator Sedation RASS 0 to -2        ASSESSMENT AND PLAN    Severe ACUTE Hypoxic and Hypercapnic Respiratory Failure due to acute COPD exacerbation with acute HFpEF CHF exacerbation   -with concomitant CAP suspect GPC such as strep pneumoniae , present on admission -  empirically on Rocephin and zithromax   - repeat tracheal aspirate for micorbiology    - follow trend procal->>0.12>>0.18   - streptococal urinary antigen->>net -600>>   - legionella urinary ag   - patient with staph epidermitis blood culture +last month 09/2019   - monitor fluid balance    -fungitell to rule out fungal etiology   - solumedrol 40 IV BID per COPD carepath   -lasix 20 bid due to A/C HFpEF -NIV qualification for home use due to chronic CO2 retention                    -extubated 10/16   Severe Acute Exacerbation of COPD  - continue with COPD carepath   - solumedrol 40 bid   - antimicrobials per CAP therapy    - pharmD consultation    - recruitement maneuvers while on MV   Acute on chronic heart failure with preserved EF -TTE 09/14/2019 - Grade 2 diastolic CHF  -monitor UOP  - gentle diuresis due to concomitant pneumonia  lasix 20 bid    Severe hyperglycemia  - partially due to systemic steroids - increase AC/HS ISS  -monitor POC q4h -diabetic coordinator monitoring   ID -continue IV abx as prescibed -follow up cultures  GI GI PROPHYLAXIS as indicated  NUTRITIONAL STATUS Nutrition Status: Nutrition Problem: Inadequate oral intake Etiology: inability to eat (pt sedated  and ventilated) Signs/Symptoms: NPO status Interventions: Tube feeding   DIET-->TF's as tolerated Constipation protocol as indicated  ENDO - will use ICU hypoglycemic\Hyperglycemia protocol if indicated     ELECTROLYTES -follow labs as needed -replace as needed -pharmacy consultation and following   DVT/GI PRX ordered and assessed TRANSFUSIONS AS NEEDED MONITOR FSBS I Assessed the need for Labs I Assessed the need for Foley I Assessed the need for Central Venous Line Family Discussion when available I Assessed the need for Mobilization I made an Assessment of medications to be adjusted accordingly Safety Risk assessment completed   CASE DISCUSSED IN MULTIDISCIPLINARY ROUNDS WITH ICU TEAM  Critical Care Time devoted to patient care services described in this note is 33 minutes.   Overall, patient is critically ill, prognosis is guarded.     Vida Rigger, M.D.  Pulmonary & Critical Care Medicine  [facility]

## 2019-10-25 NOTE — Progress Notes (Signed)
Pt follows commands during WUA BP elevated, MD aware, all other VSS. Extubated, tolerating HFNC at this time. Complains of acute on chronic generalized aches and pains. Hollering out in pain. Home oxycodone ordered and administered with no effect. 1 mg morphine administered with very little effect. 1 mg more morphine administered along with benadryl for 9/10 pain. Pt appears comfortable now. Will continue to monitor.

## 2019-10-25 NOTE — Progress Notes (Signed)
Pt drifting in and out of sleep. Responds to voice. RN obtained CBG and administered insulin. Pt is now complaining of severe pain but is seen drifting off to sleep. Not safe to administer pain medication at this time. Football game on television and rest interventions for pain at this time. Will continue to monitor.

## 2019-10-25 NOTE — Progress Notes (Signed)
Dilaudid administered for severe pain. BP elevated since extubation, MD aware. Midodrine discontinued, Lasix 40 mg once given, pain is being addressed. Will continue to monitor.

## 2019-10-26 LAB — BASIC METABOLIC PANEL
Anion gap: 11 (ref 5–15)
BUN: 30 mg/dL — ABNORMAL HIGH (ref 8–23)
CO2: 37 mmol/L — ABNORMAL HIGH (ref 22–32)
Calcium: 8.8 mg/dL — ABNORMAL LOW (ref 8.9–10.3)
Chloride: 94 mmol/L — ABNORMAL LOW (ref 98–111)
Creatinine, Ser: 0.97 mg/dL (ref 0.61–1.24)
GFR, Estimated: >60 mL/min (ref >60)
Glucose, Bld: 201 mg/dL — ABNORMAL HIGH (ref 70–99)
Potassium: 4.6 mmol/L (ref 3.5–5.1)
Sodium: 142 mmol/L (ref 135–145)

## 2019-10-26 LAB — CBC WITH DIFFERENTIAL/PLATELET
Abs Immature Granulocytes: 0.05 10*3/uL (ref 0.00–0.07)
Basophils Absolute: 0 10*3/uL (ref 0.0–0.1)
Basophils Relative: 0 %
Eosinophils Absolute: 0 10*3/uL (ref 0.0–0.5)
Eosinophils Relative: 0 %
HCT: 35.1 % — ABNORMAL LOW (ref 39.0–52.0)
Hemoglobin: 10.7 g/dL — ABNORMAL LOW (ref 13.0–17.0)
Immature Granulocytes: 1 %
Lymphocytes Relative: 6 %
Lymphs Abs: 0.7 10*3/uL (ref 0.7–4.0)
MCH: 26 pg (ref 26.0–34.0)
MCHC: 30.5 g/dL (ref 30.0–36.0)
MCV: 85.4 fL (ref 80.0–100.0)
Monocytes Absolute: 0.4 10*3/uL (ref 0.1–1.0)
Monocytes Relative: 4 %
Neutro Abs: 9.3 10*3/uL — ABNORMAL HIGH (ref 1.7–7.7)
Neutrophils Relative %: 89 %
Platelets: 252 10*3/uL (ref 150–400)
RBC: 4.11 MIL/uL — ABNORMAL LOW (ref 4.22–5.81)
RDW: 18.1 % — ABNORMAL HIGH (ref 11.5–15.5)
WBC: 10.4 10*3/uL (ref 4.0–10.5)
nRBC: 0 % (ref 0.0–0.2)

## 2019-10-26 LAB — GLUCOSE, CAPILLARY
Glucose-Capillary: 191 mg/dL — ABNORMAL HIGH (ref 70–99)
Glucose-Capillary: 163 mg/dL — ABNORMAL HIGH (ref 70–99)
Glucose-Capillary: 195 mg/dL — ABNORMAL HIGH (ref 70–99)
Glucose-Capillary: 225 mg/dL — ABNORMAL HIGH (ref 70–99)
Glucose-Capillary: 158 mg/dL — ABNORMAL HIGH (ref 70–99)

## 2019-10-26 LAB — MAGNESIUM: Magnesium: 2.1 mg/dL (ref 1.7–2.4)

## 2019-10-26 LAB — PHOSPHORUS: Phosphorus: 4.4 mg/dL (ref 2.5–4.6)

## 2019-10-26 MED ORDER — ASPIRIN EC 81 MG PO TBEC
81.0000 mg | DELAYED_RELEASE_TABLET | Freq: Every day | ORAL | Status: DC
  Administered 2019-10-26 – 2019-10-27 (×2): 81 mg via ORAL
  Filled 2019-10-26 (×2): qty 1

## 2019-10-26 MED ORDER — OXYCODONE HCL 5 MG PO TABS
10.0000 mg | ORAL_TABLET | Freq: Three times a day (TID) | ORAL | Status: DC
  Administered 2019-10-26 – 2019-10-27 (×5): 10 mg via ORAL
  Filled 2019-10-26 (×5): qty 2

## 2019-10-26 MED ORDER — HYDROMORPHONE HCL 1 MG/ML IJ SOLN
1.0000 mg | INTRAMUSCULAR | Status: DC | PRN
  Administered 2019-10-26 (×2): 2 mg via INTRAVENOUS
  Administered 2019-10-26: 1 mg via INTRAVENOUS
  Administered 2019-10-26: 2 mg via INTRAVENOUS
  Administered 2019-10-26: 1 mg via INTRAVENOUS
  Administered 2019-10-27 (×4): 2 mg via INTRAVENOUS
  Filled 2019-10-26 (×3): qty 2
  Filled 2019-10-26 (×2): qty 1
  Filled 2019-10-26 (×4): qty 2

## 2019-10-26 MED ORDER — TAMSULOSIN HCL 0.4 MG PO CAPS
0.4000 mg | ORAL_CAPSULE | Freq: Every day | ORAL | Status: DC
  Administered 2019-10-26: 0.4 mg via ORAL
  Filled 2019-10-26: qty 1

## 2019-10-26 MED ORDER — INSULIN GLARGINE 100 UNIT/ML ~~LOC~~ SOLN
22.0000 [IU] | Freq: Every day | SUBCUTANEOUS | Status: DC
  Administered 2019-10-26: 22 [IU] via SUBCUTANEOUS
  Filled 2019-10-26 (×2): qty 0.22

## 2019-10-26 MED ORDER — BISOPROLOL FUMARATE 5 MG PO TABS
2.5000 mg | ORAL_TABLET | Freq: Every day | ORAL | Status: DC
  Administered 2019-10-26 – 2019-10-27 (×2): 2.5 mg via ORAL
  Filled 2019-10-26 (×2): qty 0.5

## 2019-10-26 MED ORDER — LACTULOSE 10 GM/15ML PO SOLN: 20.0000 g | Freq: Every day | ORAL | Status: DC | PRN

## 2019-10-26 MED ORDER — ATORVASTATIN CALCIUM 20 MG PO TABS
40.0000 mg | ORAL_TABLET | Freq: Every day | ORAL | Status: DC
  Administered 2019-10-26: 40 mg via ORAL
  Filled 2019-10-26: qty 2

## 2019-10-26 MED ORDER — SENNOSIDES-DOCUSATE SODIUM 8.6-50 MG PO TABS
1.0000 | ORAL_TABLET | Freq: Every day | ORAL | Status: DC
  Administered 2019-10-26 – 2019-10-27 (×2): 1 via ORAL
  Filled 2019-10-26 (×2): qty 1

## 2019-10-26 MED ORDER — AMIODARONE HCL 200 MG PO TABS
200.0000 mg | ORAL_TABLET | Freq: Every day | ORAL | Status: DC
  Administered 2019-10-26 – 2019-10-27 (×2): 200 mg via ORAL
  Filled 2019-10-26 (×2): qty 1

## 2019-10-26 MED ORDER — LAMOTRIGINE 25 MG PO TABS
100.0000 mg | ORAL_TABLET | Freq: Every day | ORAL | Status: DC
  Administered 2019-10-26 – 2019-10-27 (×2): 100 mg via ORAL
  Filled 2019-10-26 (×2): qty 4

## 2019-10-26 MED ORDER — PANTOPRAZOLE SODIUM 40 MG PO TBEC
40.0000 mg | DELAYED_RELEASE_TABLET | Freq: Every day | ORAL | Status: DC
  Administered 2019-10-26 – 2019-10-27 (×2): 40 mg via ORAL
  Filled 2019-10-26 (×2): qty 1

## 2019-10-26 MED ORDER — PREGABALIN 75 MG PO CAPS
200.0000 mg | ORAL_CAPSULE | Freq: Three times a day (TID) | ORAL | Status: DC
  Administered 2019-10-26 – 2019-10-27 (×5): 200 mg via ORAL
  Filled 2019-10-26 (×5): qty 2

## 2019-10-26 MED ORDER — TRAZODONE HCL 100 MG PO TABS
100.0000 mg | ORAL_TABLET | Freq: Every day | ORAL | Status: DC
  Administered 2019-10-26: 100 mg via ORAL
  Filled 2019-10-26: qty 1

## 2019-10-26 MED ORDER — CYCLOBENZAPRINE HCL 10 MG PO TABS
5.0000 mg | ORAL_TABLET | Freq: Three times a day (TID) | ORAL | Status: DC | PRN
  Filled 2019-10-26: qty 1
  Filled 2019-10-26: qty 0.5

## 2019-10-26 NOTE — Progress Notes (Signed)
ANTICOAGULATION CONSULT NOTE - Initial Consult  Pharmacy Consult for Rivaroxaban Indication: atrial fibrillation  Allergies  Allergen Reactions  . Acetaminophen Other (See Comments)    Reaction:  Unknown  Kidney failure Told not to take from home M.D. Related to kidney and renal failure   . Fentanyl   . Nsaids Other (See Comments)    Reaction:  Unknown  Kidney failure Patient states not to take from home M.D. Related to kidney and renal failure.  Other reaction(s): Unknown    Patient Measurements: Height: 6' (182.9 cm) Weight: 90.6 kg (199 lb 11.8 oz) IBW/kg (Calculated) : 77.6  Vital Signs: Temp: 98.5 F (36.9 C) (10/17 0800) Temp Source: Oral (10/17 0800) BP: 169/83 (10/17 0800) Pulse Rate: 87 (10/17 0800)  Labs: Recent Labs    10/24/19 0415 10/24/19 0415 10/25/19 0505 10/26/19 0515  HGB 9.3*   < > 9.4* 10.7*  HCT 31.5*  --  31.1* 35.1*  PLT 257  --  211 252  CREATININE 1.04  --  0.90 0.97   < > = values in this interval not displayed.    Estimated Creatinine Clearance: 82.2 mL/min (by C-G formula based on SCr of 0.97 mg/dL).    Assessment: 64 yo male on rivaroxaban 20 mg PO daily with supper PTA for AFib.   Goal of Therapy:  Monitor platelets by anticoagulation protocol: Yes   Plan:  Continue rivaroxaban 20 mg daily with supper  Hgb/plt count stable/improved SCr and CBC at least every three days per policy   Regino Schultze, Robey Massmann L 10/26/2019,9:40 AM

## 2019-10-26 NOTE — Progress Notes (Signed)
PROGRESS NOTE    Gregory Hall  OZH:[id number] DOB: 01-15-1955 DOA: 10/22/2019 PCP: Enid Baas, MD  Outpatient Specialists: [facility], [facility]    Brief Narrative:   64 year old male admitted with acute metabolic encephalopathy and acute on chronic hypoxic hypercapnic respiratory failuresecondary to COPD Exacerbation.Failed trial of BiPAP requiring intubation in the ED.  10/13:Presented to ED due to altered mental status, required intubation due to worsening somnolence and unchanged ABG 10/14 remains intubated 10/15- on MV FiO2 40% on levophed 4, fentanyl and propofol to RASS-1, awakening trial failed with aggitation and encephalopathy.  DNR status. Palliative following.  10/16- patient improved. He is liberated off mechanical ventilator.  He is asking for high dose narcotics even after 2 doses of po oxycontin. Delivered IV morphine twice and patient seems to be more calm now.    Assessment & Plan:   Active Problems:   Acute on chronic respiratory failure with hypoxia and hypercapnia (HCC)   Palliative care by specialist   DNR (do not resuscitate)  # COPD exacerbation, severe # Acute hypoxic hypercapnic respiratory failure on chronic  Intubated in ED on 10/13, extubated 10/16. On 3 L [place] O2 at home, now breathing comfortably on 6 L - cont ceftriaxone/azithromycin (10/15> ; procalcitonin low will likely d/c abx soon - f/u tracheal aspirate, ngtd - f/u fungitell and letionella antigens. Strep pneumo neg - cont furosemide 20 mg IV bid from home 20 mb po qd - scheduled duonebs - cont methylpred 40 mg IV bid, likely transition to orals tomorrow - hold home trelegy, roflumilast, prednisone, spiriva  # Acute exacerbation of HFpEF Appears to be resolving - cont lasix 20 mg IV bid for home 20 mg po qd - monitor uop, 4 L last 24  # CAD - s/p stents - cont home bisoprolol, atorvastatin, aspirin  # History a-fib/flutter, most recently dccv  09/2019 Appears in sinus rhythm here - cont home bisoprolol, amiodarone, rivaroxaban  # Mood disorder - cont home lamictal  # Chronic pain  Here complaining of significant chronic pain - cont home oxy 10 tid and flexeril and lyrica, add dilaudid for breakthrough pain  # T2DM Most recently glucose 163 - cont home lantus 22 qhs - SSI - hold home metformin  # Blood pressure Low BPs at home, here bp elevated - hold home midodrine - diuresis as above  # Esophagitis - cont home pantoprazole  # BPH - cont home flomax  # Insomnia - cont home trazodone   DVT prophylaxis: rivaroxaban Code Status: dnr Family Communication: none at bedside  Status is: Inpatient  Remains inpatient appropriate because:Inpatient level of care appropriate due to severity of illness   Dispo: The patient is from: Group home              Anticipated d/c is to: Group home              Anticipated d/c date is: > 3 days              Patient currently is not medically stable to d/c.        Consultants:  Critical care  Procedures: intubation    Subjective: Thinks breathing is fine "so long as I don't move around." denies fever. No abd pain. Poor appetite but tolerated some food yesterday evening. Chronic pain is bothering him, mainly neck and back  Objective: Vitals:   10/26/19 0400 10/26/19 0500 10/26/19 0600 10/26/19 0800  BP: (!) 150/73 (!) 146/72 (!) 170/88 (!) 169/83  Pulse: 79 83 86 87  Resp: 11 16 15 15   Temp:    98.5 F (36.9 C)  TempSrc:    Oral  SpO2: 92% 95% 90% 91%  Weight:      Height:        Intake/Output Summary (Last 24 hours) at 10/26/2019 0854 Last data filed at 10/26/2019 0842 Gross per 24 hour  Intake 513.3 ml  Output 4175 ml  Net -3661.7 ml   Filed Weights   10/23/19 0030 10/25/19 0320 10/26/19 0300  Weight: 91.7 kg 92.4 kg 90.6 kg    Examination:  General exam: Appears calm, in mild pain. Chronically ill appearing Respiratory system: poor  effort, scattered rhonchi Cardiovascular system: S1 & S2 heard, mod systolic murmur Gastrointestinal system: Abdomen is nondistended, soft and nontender. No organomegaly or masses felt. Normal bowel sounds heard. Central nervous system: Alert and oriented. No focal neurological deficits. Extremities: Symmetric 5 x 5 power. Trace LE edema. Mod upper extremity edema Skin: scattered echymoses extremities Psychiatry: Judgement and insight appear normal. Mood & affect appropriate.     Data Reviewed: I have personally reviewed following labs and imaging studies  CBC: Recent Labs  Lab 10/22/19 2037 10/23/19 0411 10/24/19 0415 10/25/19 0505 10/26/19 0515  WBC 7.9 5.9 10.4 9.3 10.4  NEUTROABS 5.6 5.1 9.1* 8.2* 9.3*  HGB 11.5* 8.8* 9.3* 9.4* 10.7*  HCT 39.3 31.2* 31.5* 31.1* 35.1*  MCV 92.3 93.1 88.2 87.9 85.4  PLT 220 179 257 211 252   Basic Metabolic Panel: Recent Labs  Lab 10/23/19 0411 10/23/19 1241 10/24/19 0415 10/25/19 0505 10/26/19 0515  NA POSSIBLE CONTAMINATION 142 139 140 142  K POSSIBLE CONTAMINATION 4.7 4.8 5.1 4.6  CL POSSIBLE CONTAMINATION 98 98 97* 94*  CO2 POSSIBLE CONTAMINATION 35* 35* 36* 37*  GLUCOSE POSSIBLE CONTAMINATION 150* 242* 232* 201*  BUN POSSIBLE CONTAMINATION 13 21 26* 30*  CREATININE POSSIBLE CONTAMINATION 1.04 1.04 0.90 0.97  CALCIUM POSSIBLE CONTAMINATION 8.6* 8.6* 8.9 8.8*  MG  --   --   --  2.3 2.1  PHOS  --   --   --  4.4 4.4   GFR: Estimated Creatinine Clearance: 82.2 mL/min (by C-G formula based on SCr of 0.97 mg/dL). Liver Function Tests: Recent Labs  Lab 10/22/19 2037 10/23/19 0411  AST 17 POSSIBLE CONTAMINATION  ALT 8 POSSIBLE CONTAMINATION  ALKPHOS 109 POSSIBLE CONTAMINATION  BILITOT 0.7  --   PROT 7.2 POSSIBLE CONTAMINATION  ALBUMIN 3.8 POSSIBLE CONTAMINATION   No results for input(s): LIPASE, AMYLASE in the last 168 hours. No results for input(s): AMMONIA in the last 168 hours. Coagulation Profile: No results for  input(s): INR, PROTIME in the last 168 hours. Cardiac Enzymes: No results for input(s): CKTOTAL, CKMB, CKMBINDEX, TROPONINI in the last 168 hours. BNP (last 3 results) No results for input(s): PROBNP in the last 8760 hours. HbA1C: No results for input(s): HGBA1C in the last 72 hours. CBG: Recent Labs  Lab 10/25/19 1628 10/25/19 1930 10/25/19 2342 10/26/19 0317 10/26/19 0735  GLUCAP 229* 175* 157* 191* 163*   Lipid Profile: Recent Labs    10/24/19 0415 10/25/19 0505  TRIG 69 67   Thyroid Function Tests: No results for input(s): TSH, T4TOTAL, FREET4, T3FREE, THYROIDAB in the last 72 hours. Anemia Panel: No results for input(s): VITAMINB12, FOLATE, FERRITIN, TIBC, IRON, RETICCTPCT in the last 72 hours. Urine analysis:    Component Value Date/Time   COLORURINE YELLOW (A) 10/22/2019 2037   APPEARANCEUR CLEAR (A) 10/22/2019 2037   APPEARANCEUR  Clear 10/28/2013 2215   LABSPEC 1.009 10/22/2019 2037   LABSPEC 1.002 10/28/2013 2215   PHURINE 5.0 10/22/2019 2037   GLUCOSEU NEGATIVE 10/22/2019 2037   GLUCOSEU Negative 10/28/2013 2215   HGBUR NEGATIVE 10/22/2019 2037   BILIRUBINUR NEGATIVE 10/22/2019 2037   BILIRUBINUR Negative 10/28/2013 2215   KETONESUR NEGATIVE 10/22/2019 2037   PROTEINUR NEGATIVE 10/22/2019 2037   UROBILINOGEN 1.0 03/29/2009 0840   NITRITE NEGATIVE 10/22/2019 2037   LEUKOCYTESUR NEGATIVE 10/22/2019 2037   LEUKOCYTESUR Negative 10/28/2013 2215   Sepsis Labs: @LABRCNTIP (procalcitonin:4,lacticidven:4)  ) Recent Results (from the past 240 hour(s))  Respiratory Panel by RT PCR (Flu A&B, Covid) - Nasopharyngeal Swab     Status: None   Collection Time: 10/22/19  9:22 PM   Specimen: Nasopharyngeal Swab  Result Value Ref Range Status   SARS Coronavirus 2 by RT PCR NEGATIVE NEGATIVE Final    Comment: (NOTE) SARS-CoV-2 target nucleic acids are NOT DETECTED.  The SARS-CoV-2 RNA is generally detectable in upper respiratoy specimens during the acute phase of  infection. The lowest concentration of SARS-CoV-2 viral copies this assay can detect is 131 copies/mL. A negative result does not preclude SARS-Cov-2 infection and should not be used as the sole basis for treatment or other patient management decisions. A negative result may occur with  improper specimen collection/handling, submission of specimen other than nasopharyngeal swab, presence of viral mutation(s) within the areas targeted by this assay, and inadequate number of viral copies (<131 copies/mL). A negative result must be combined with clinical observations, patient history, and epidemiological information. The expected result is Negative.  Fact Sheet for Patients:  10/24/19  Fact Sheet for Healthcare Providers:  https://www.moore.com/  This test is no t yet approved or cleared by the https://www.young.biz/ FDA and  has been authorized for detection and/or diagnosis of SARS-CoV-2 by FDA under an Emergency Use Authorization (EUA). This EUA will remain  in effect (meaning this test can be used) for the duration of the COVID-19 declaration under Section 564(b)(1) of the Act, 21 U.S.C. section 360bbb-3(b)(1), unless the authorization is terminated or revoked sooner.     Influenza A by PCR NEGATIVE NEGATIVE Final   Influenza B by PCR NEGATIVE NEGATIVE Final    Comment: (NOTE) The Xpert Xpress SARS-CoV-2/FLU/RSV assay is intended as an aid in  the diagnosis of influenza from Nasopharyngeal swab specimens and  should not be used as a sole basis for treatment. Nasal washings and  aspirates are unacceptable for Xpert Xpress SARS-CoV-2/FLU/RSV  testing.  Fact Sheet for Patients: [place]  Fact Sheet for Healthcare Providers: https://www.moore.com/  This test is not yet approved or cleared by the https://www.young.biz/ FDA and  has been authorized for detection and/or diagnosis of SARS-CoV-2  by  FDA under an Emergency Use Authorization (EUA). This EUA will remain  in effect (meaning this test can be used) for the duration of the  Covid-19 declaration under Section 564(b)(1) of the Act, 21  U.S.C. section 360bbb-3(b)(1), unless the authorization is  terminated or revoked. Performed at [facility], [address] [place], [place]   Respiratory Panel by PCR     Status: None   Collection Time: 10/23/19 12:02 AM   Specimen: Nasopharyngeal Swab; Respiratory  Result Value Ref Range Status   Adenovirus NOT DETECTED NOT DETECTED Final   Coronavirus 229E NOT DETECTED NOT DETECTED Final    Comment: (NOTE) The Coronavirus on the Respiratory Panel, DOES NOT test for the novel  Coronavirus (2019 nCoV)  Coronavirus HKU1 NOT DETECTED NOT DETECTED Final   Coronavirus NL63 NOT DETECTED NOT DETECTED Final   Coronavirus OC43 NOT DETECTED NOT DETECTED Final   Metapneumovirus NOT DETECTED NOT DETECTED Final   Rhinovirus / Enterovirus NOT DETECTED NOT DETECTED Final   Influenza A NOT DETECTED NOT DETECTED Final   Influenza B NOT DETECTED NOT DETECTED Final   Parainfluenza Virus 1 NOT DETECTED NOT DETECTED Final   Parainfluenza Virus 2 NOT DETECTED NOT DETECTED Final   Parainfluenza Virus 3 NOT DETECTED NOT DETECTED Final   Parainfluenza Virus 4 NOT DETECTED NOT DETECTED Final   Respiratory Syncytial Virus NOT DETECTED NOT DETECTED Final   Bordetella pertussis NOT DETECTED NOT DETECTED Final   Chlamydophila pneumoniae NOT DETECTED NOT DETECTED Final   Mycoplasma pneumoniae NOT DETECTED NOT DETECTED Final    Comment: Performed at [facility], [address], [place], [place] [address]  Culture, respiratory     Status: None (Preliminary result)   Collection Time: 10/24/19  5:04 PM   Specimen: Tracheal Aspirate; Respiratory  Result Value Ref Range Status   Specimen Description   Final    TRACHEAL ASPIRATE Performed at [facility], [address], [place], [place] [address]    Special Requests   Final    NONE Performed at [facility], [address] [place], [place] [address]    Gram Stain   Final    FEW WBC PRESENT, PREDOMINANTLY PMN ABUNDANT GRAM NEGATIVE RODS Performed at [facility], [address], [place], [place] [address]    Culture ABUNDANT GRAM NEGATIVE RODS  Final   Report Status PENDING  Incomplete  MRSA PCR Screening     Status: None   Collection Time: 10/24/19  8:53 PM   Specimen: Nasopharyngeal  Result Value Ref Range Status   MRSA by PCR NEGATIVE NEGATIVE Final    Comment:        The GeneXpert MRSA Assay (FDA approved for NASAL specimens only), is one component of a comprehensive MRSA colonization surveillance program. It is not intended to diagnose MRSA infection nor to guide or monitor treatment for MRSA infections. Performed at [facility], [address], [place], [place] [address]          Radiology Studies: No results found.      Scheduled Meds: . amiodarone  200 mg Oral Daily  . aspirin EC  81 mg Oral Daily  . atorvastatin  40 mg Oral Daily  . azithromycin  500 mg Oral Daily  . bisoprolol  2.5 mg Oral Daily  . budesonide (PULMICORT) nebulizer solution  0.5 mg Nebulization Daily  . chlorhexidine  15 mL Mouth Rinse BID  . Chlorhexidine Gluconate Cloth  6 each Topical Daily  . diphenhydrAMINE  50 mg Intravenous TID  . docusate  100 mg Oral BID  . feeding supplement (PROSource TF)  45 mL Per Tube BID  . furosemide  20 mg Intravenous BID  . insulin aspart  0-15 Units Subcutaneous Q4H  . insulin glargine  22 Units Subcutaneous QHS  . ipratropium-albuterol  3 mL Nebulization Q6H  . lamoTRIgine  100 mg Oral Daily  . mouth rinse  15 mL Mouth Rinse q12n4p  . methylPREDNISolone (SOLU-MEDROL) injection  40 mg Intravenous Q12H  . Oxycodone HCl  10 mg Oral TID  . pantoprazole  40 mg Oral Daily  . pantoprazole (PROTONIX) IV  40 mg Intravenous q1800  .  polyethylene glycol  17 g Oral Daily  .  pregabalin  200 mg Oral TID  . rivaroxaban  20 mg Per Tube Q supper  . senna-docusate  1 tablet Oral Daily  . tamsulosin  0.4 mg Oral QPC supper  . traZODone  100 mg Oral QHS   Continuous Infusions: . sodium chloride Stopped (10/25/19 1458)  . sodium chloride Stopped (10/23/19 0416)  . cefTRIAXone (ROCEPHIN)  IV Stopped (10/25/19 1236)  . feeding supplement (VITAL AF 1.2 CAL) 1,000 mL (10/24/19 2226)   Antibiotics Given (last 72 hours)    Date/Time Action Medication Dose Rate   10/24/19 1244 Given   azithromycin (ZITHROMAX) tablet 500 mg 500 mg    10/24/19 1246 New Bag/Given   cefTRIAXone (ROCEPHIN) 1 g in sodium chloride 0.9 % 100 mL IVPB 1 g 200 mL/hr   10/25/19 1206 New Bag/Given   cefTRIAXone (ROCEPHIN) 1 g in sodium chloride 0.9 % 100 mL IVPB 1 g 200 mL/hr   10/25/19 1300 Given   azithromycin (ZITHROMAX) 200 MG/5ML suspension 500 mg 500 mg         LOS: 4 days    Time spent: 45 min    Silvano Bilis, MD [facility]   If 7PM-7AM, please contact night-coverage [email or web address] Password [facility] 10/26/2019, 8:54 AM

## 2019-10-26 NOTE — Progress Notes (Signed)
PHARMACY CONSULT NOTE - FOLLOW UP  Pharmacy Consult for Electrolyte Monitoring and Replacement   Recent Labs: Potassium (mmol/L)  Date Value  10/26/2019 4.6  03/31/2013 3.6   Magnesium (mg/dL)  Date Value  83/72/9021 2.1  06/25/2012 2.4   Calcium (mg/dL)  Date Value  11/55/2080 8.8 (L)   Calcium, Total (mg/dL)  Date Value  22/33/6122 7.6 (L)   Albumin (g/dL)  Date Value  44/97/5300 POSSIBLE CONTAMINATION  10/06/2019 3.9  03/30/2013 3.2 (L)   Phosphorus (mg/dL)  Date Value  51/10/2109 4.4  03/20/2012 3.4   Sodium (mmol/L)  Date Value  10/26/2019 142  10/17/2019 142  03/31/2013 142   Assessment: 64 year old male admitted with respiratory acidosis failing BiPAP and requiring intubation.extubated 10/16. Pharmacy consulted for electrolyte management.  Goal of Therapy:  [place]ytes WNL, K >/= 4  Plan:  Patient on lasix 20 mg IV bid. No electrolyte replacement indicated. Continue to follow along.  Angelique Blonder ,PharmD Clinical Pharmacist 10/26/2019 11:56 AM

## 2019-10-26 NOTE — Progress Notes (Signed)
CRITICAL CARE NOTE 64 year old male admitted with acute metabolic encephalopathy and acute on chronic hypoxic hypercapnic respiratory failure secondary to COPD Exacerbation.  Failed trial of BiPAP requiring intubation in the ED.   Significant Hospital Events   10/13: Presented to ED due to altered mental status, required intubation due to worsening somnolence and unchanged ABG 10/14 remains intubated 10/15- on MV FiO2 40% on levophed 4, fentanyl and propofol to RASS-1, awakening trial failed with aggitation and encephalopathy.  DNR status. Palliative following.  10/16- patient improved. He is liberated off mechanical ventilator.  He is asking for high dose narcotics even after 2 doses of po oxycontin. Delivered IV morphine twice and patient seems to be more calm now.  10/26/19- patient evaluated at bedside, he is stable but still requests more narcotics, we upgraded analgesic to dilaudid but still he screams out in pain but unable to specify pain location states generalized.  Were optimizing for TRH transfer   Consults:  PCCM  Procedures:  10/13: Endotracheal intubation  Significant Diagnostic Tests:  10/13: Chest x-ray>>Portable AP upright view at 2050 hours. Stable lung volumes, mediastinal contours and thoracic spinal stimulator. Chronic lower lobe atelectasis and/or scarring. Chronic increased interstitial opacity in both lungs. Lung markings today appear at baseline. No pneumothorax or pleural effusion. Visualized tracheal air column is within normal limits. Partially visible cervical ACDF. No acute osseous abnormality identified. 10/13: CT head>>1. No acute intracranial pathology. 2. Paranasal sinus disease.  Micro Data:  10/13: SARS-CoV-2>> negative 10/13: Influenza>> negative 10/13: Respiratory Viral Panel>> 10/14: Tracheal aspirate>>      CC  follow up respiratory failure  SUBJECTIVE Patient remains critically ill Prognosis is guarded   BP (!) 170/88    Pulse 86   Temp 98.3 F (36.8 C) (Oral)   Resp 15   Ht 6' (1.829 m)   Wt 90.6 kg   SpO2 90%   BMI 27.09 kg/m    I/O last 3 completed shifts: In: 4601.8 [I.V.:2017.5; NG/GT:2484.3; IV Piggyback:100] Out: 5150 [Urine:5150] No intake/output data recorded.  SpO2: 90 % O2 Flow Rate (L/min): 8 L/min FiO2 (%): 35 %  Estimated body mass index is 27.09 kg/m as calculated from the following:   Height as of this encounter: 6' (1.829 m).   Weight as of this encounter: 90.6 kg.    Vent Mode: PSV;CPAP FiO2 (%):  [35 %] 35 % PEEP:  [5 cmH20] 5 cmH20 Pressure Support:  [5 cmH20] 5 cmH20 CBC    Component Value Date/Time   WBC 10.4 10/26/2019 0515   RBC 4.11 (L) 10/26/2019 0515   HGB 10.7 (L) 10/26/2019 0515   HGB 10.4 (L) 10/17/2019 1013   HCT 35.1 (L) 10/26/2019 0515   HCT 33.5 (L) 10/17/2019 1013   PLT 252 10/26/2019 0515   PLT 280 10/17/2019 1013   MCV 85.4 10/26/2019 0515   MCV 87 10/17/2019 1013   MCV 97 03/31/2013 0224   MCH 26.0 10/26/2019 0515   MCHC 30.5 10/26/2019 0515   RDW 18.1 (H) 10/26/2019 0515   RDW 16.0 (H) 10/17/2019 1013   RDW 14.0 03/31/2013 0224   LYMPHSABS 0.7 10/26/2019 0515   LYMPHSABS 2.3 10/06/2019 0829   LYMPHSABS 2.8 03/31/2013 0224   MONOABS 0.4 10/26/2019 0515   MONOABS 0.6 03/31/2013 0224   EOSABS 0.0 10/26/2019 0515   EOSABS 0.3 10/06/2019 0829   EOSABS 0.3 03/31/2013 0224   BASOSABS 0.0 10/26/2019 0515   BASOSABS 0.1 10/06/2019 0829   BASOSABS 0.1 03/31/2013 0224   BMP Latest  Ref Rng & Units 10/26/2019 10/25/2019 10/24/2019  Glucose 70 - 99 mg/dL 546(T) 035(W) 656(C)  BUN 8 - 23 mg/dL 12(X) 51(Z) 21  Creatinine 0.61 - 1.24 mg/dL 0.01 7.49 4.49  BUN/Creat Ratio 10 - 24 - - -  Sodium 135 - 145 mmol/L 142 140 139  Potassium 3.5 - 5.1 mmol/L 4.6 5.1 4.8  Chloride 98 - 111 mmol/L 94(L) 97(L) 98  CO2 22 - 32 mmol/L 37(H) 36(H) 35(H)  Calcium 8.9 - 10.3 mg/dL 6.7(R) 8.9 9.1(M)    SIGNIFICANT EVENTS   REVIEW OF SYSTEMS  10 point  ROS done and is negative except for generalized pain and fatigue   PHYSICAL EXAMINATION:  GENERAL:Sedated age approporate NECK: Supple.  PULMONARY: CTAB CARDIOVASCULAR: S1 and S2. Regular rate and rhythm. No murmurs, rubs, or gallops.  GASTROINTESTINAL: Soft, nontender, -distended.  Positive bowel sounds.   MUSCULOSKELETAL: 2+ edema swelling of LE ,NO clubbing NEUROLOGIC: no FND grossly SKIN:intact,warm,dry  MEDICATIONS: I have reviewed all medications and confirmed regimen as documented   CULTURE RESULTS   Recent Results (from the past 240 hour(s))  Respiratory Panel by RT PCR (Flu A&B, Covid) - Nasopharyngeal Swab     Status: None   Collection Time: 10/22/19  9:22 PM   Specimen: Nasopharyngeal Swab  Result Value Ref Range Status   SARS Coronavirus 2 by RT PCR NEGATIVE NEGATIVE Final    Comment: (NOTE) SARS-CoV-2 target nucleic acids are NOT DETECTED.  The SARS-CoV-2 RNA is generally detectable in upper respiratoy specimens during the acute phase of infection. The lowest concentration of SARS-CoV-2 viral copies this assay can detect is 131 copies/mL. A negative result does not preclude SARS-Cov-2 infection and should not be used as the sole basis for treatment or other patient management decisions. A negative result may occur with  improper specimen collection/handling, submission of specimen other than nasopharyngeal swab, presence of viral mutation(s) within the areas targeted by this assay, and inadequate number of viral copies (<131 copies/mL). A negative result must be combined with clinical observations, patient history, and epidemiological information. The expected result is Negative.  Fact Sheet for Patients:  https://www.moore.com/  Fact Sheet for Healthcare Providers:  https://www.young.biz/  This test is no t yet approved or cleared by the [place] FDA and  has been authorized for detection and/or diagnosis of  SARS-CoV-2 by FDA under an Emergency Use Authorization (EUA). This EUA will remain  in effect (meaning this test can be used) for the duration of the COVID-19 declaration under Section 564(b)(1) of the Act, 21 U.S.C. section 360bbb-3(b)(1), unless the authorization is terminated or revoked sooner.     Influenza A by PCR NEGATIVE NEGATIVE Final   Influenza B by PCR NEGATIVE NEGATIVE Final    Comment: (NOTE) The Xpert Xpress SARS-CoV-2/FLU/RSV assay is intended as an aid in  the diagnosis of influenza from Nasopharyngeal swab specimens and  should not be used as a sole basis for treatment. Nasal washings and  aspirates are unacceptable for Xpert Xpress SARS-CoV-2/FLU/RSV  testing.  Fact Sheet for Patients: https://www.moore.com/  Fact Sheet for Healthcare Providers: https://www.young.biz/  This test is not yet approved or cleared by the [place] FDA and  has been authorized for detection and/or diagnosis of SARS-CoV-2 by  FDA under an Emergency Use Authorization (EUA). This EUA will remain  in effect (meaning this test can be used) for the duration of the  Covid-19 declaration under Section 564(b)(1) of the Act, 21  U.S.C. section 360bbb-3(b)(1), unless the authorization is  terminated or revoked. Performed at [facility], [address] [place], [place] [address]   Respiratory Panel by PCR     Status: None   Collection Time: 10/23/19 12:02 AM   Specimen: Nasopharyngeal Swab; Respiratory  Result Value Ref Range Status   Adenovirus NOT DETECTED NOT DETECTED Final   Coronavirus 229E NOT DETECTED NOT DETECTED Final    Comment: (NOTE) The Coronavirus on the Respiratory Panel, DOES NOT test for the novel  Coronavirus (2019 nCoV)    Coronavirus HKU1 NOT DETECTED NOT DETECTED Final   Coronavirus NL63 NOT DETECTED NOT DETECTED Final   Coronavirus OC43 NOT DETECTED NOT DETECTED Final   Metapneumovirus NOT DETECTED NOT DETECTED  Final   Rhinovirus / Enterovirus NOT DETECTED NOT DETECTED Final   Influenza A NOT DETECTED NOT DETECTED Final   Influenza B NOT DETECTED NOT DETECTED Final   Parainfluenza Virus 1 NOT DETECTED NOT DETECTED Final   Parainfluenza Virus 2 NOT DETECTED NOT DETECTED Final   Parainfluenza Virus 3 NOT DETECTED NOT DETECTED Final   Parainfluenza Virus 4 NOT DETECTED NOT DETECTED Final   Respiratory Syncytial Virus NOT DETECTED NOT DETECTED Final   Bordetella pertussis NOT DETECTED NOT DETECTED Final   Chlamydophila pneumoniae NOT DETECTED NOT DETECTED Final   Mycoplasma pneumoniae NOT DETECTED NOT DETECTED Final    Comment: Performed at [facility], [address] [place], [place] [address]  Culture, respiratory     Status: None (Preliminary result)   Collection Time: 10/24/19  5:04 PM   Specimen: Tracheal Aspirate; Respiratory  Result Value Ref Range Status   Specimen Description   Final    TRACHEAL ASPIRATE Performed at [facility], [address] [place], [place] [address]    Special Requests   Final    NONE Performed at [facility], [address] [place], [place] [address]    Gram Stain   Final    FEW WBC PRESENT, PREDOMINANTLY PMN ABUNDANT GRAM NEGATIVE RODS    Culture   Final    CULTURE REINCUBATED FOR BETTER GROWTH Performed at [facility], [address] [place], [place] [address]    Report Status PENDING  Incomplete  MRSA PCR Screening     Status: None   Collection Time: 10/24/19  8:53 PM   Specimen: Nasopharyngeal  Result Value Ref Range Status   MRSA by PCR NEGATIVE NEGATIVE Final    Comment:        The GeneXpert MRSA Assay (FDA approved for NASAL specimens only), is one component of a comprehensive MRSA colonization surveillance program. It is not intended to diagnose MRSA infection nor to guide or monitor treatment for MRSA infections. Performed at [facility], [address] [place], [place] [address]            IMAGING    No results found.   Nutrition Status: Nutrition Problem: Inadequate oral intake Etiology: inability to eat (pt sedated and ventilated) Signs/Symptoms: NPO status Interventions: Tube feeding     Indwelling Urinary Catheter continued, requirement due to   Reason to continue Indwelling Urinary Catheter strict Intake/Output monitoring for hemodynamic instability         Ventilator continued, requirement due to severe respiratory failure   Ventilator Sedation RASS 0 to -2        ASSESSMENT AND PLAN    Severe ACUTE Hypoxic and Hypercapnic Respiratory Failure due to acute COPD exacerbation with acute HFpEF CHF exacerbation   -with concomitant CAP suspect  GPC such as strep pneumoniae , present on admission - empirically on Rocephin and zithromax   - repeat tracheal aspirate for micorbiology    - follow trend procal->>0.12>>0.18   - streptococal urinary antigen-   - legionella urinary ag   - patient with staph epidermitis blood culture +last month 09/2019   - monitor fluid balance -4450CC urine over 24h 10/26/19   -fungitell to rule out fungal etiology   - solumedrol 40 IV BID per COPD carepath   -lasix 20 bid due to A/C HFpEF -NIV qualification for home use due to chronic CO2 retention                    -extubated 10/16     GNR in resp culture - continue Rocephin and Zithromax-10/17   Severe Acute Exacerbation of COPD  - continue with COPD carepath   - solumedrol 40 bid   - antimicrobials per CAP therapy    - pharmD consultation    - recruitement maneuvers   Acute on chronic heart failure with preserved EF -TTE 09/14/2019 - Grade 2 diastolic CHF  -monitor UOP  - gentle diuresis due to concomitant pneumonia  lasix 20 bid    Severe hyperglycemia  - partially due to systemic steroids - increase AC/HS ISS  -monitor POC q4h -diabetic coordinator monitoring   ID -continue IV abx as prescibed -follow up cultures  GI GI PROPHYLAXIS as  indicated  NUTRITIONAL STATUS Nutrition Status: Nutrition Problem: Inadequate oral intake Etiology: inability to eat (pt sedated and ventilated) Signs/Symptoms: NPO status Interventions: Tube feeding   DIET-->TF's as tolerated Constipation protocol as indicated  ENDO - will use ICU hypoglycemic\Hyperglycemia protocol if indicated     ELECTROLYTES -follow labs as needed -replace as needed -pharmacy consultation and following   DVT/GI PRX ordered and assessed TRANSFUSIONS AS NEEDED MONITOR FSBS I Assessed the need for Labs I Assessed the need for Foley I Assessed the need for Central Venous Line Family Discussion when available I Assessed the need for Mobilization I made an Assessment of medications to be adjusted accordingly Safety Risk assessment completed   CASE DISCUSSED IN MULTIDISCIPLINARY ROUNDS WITH ICU TEAM  Critical Care Time devoted to patient care services described in this note is 33 minutes.   Overall, patient is critically ill, prognosis is guarded.     Vida Rigger, M.D.  Pulmonary & Critical Care Medicine  [facility]

## 2019-10-27 DIAGNOSIS — J9622 Acute and chronic respiratory failure with hypercapnia: Secondary | ICD-10-CM | POA: Diagnosis not present

## 2019-10-27 DIAGNOSIS — J9621 Acute and chronic respiratory failure with hypoxia: Secondary | ICD-10-CM | POA: Diagnosis not present

## 2019-10-27 LAB — GLUCOSE, CAPILLARY
Glucose-Capillary: 144 mg/dL — ABNORMAL HIGH (ref 70–99)
Glucose-Capillary: 261 mg/dL — ABNORMAL HIGH (ref 70–99)
Glucose-Capillary: 280 mg/dL — ABNORMAL HIGH (ref 70–99)
Glucose-Capillary: 281 mg/dL — ABNORMAL HIGH (ref 70–99)
Glucose-Capillary: 85 mg/dL (ref 70–99)

## 2019-10-27 LAB — CBC WITH DIFFERENTIAL/PLATELET
Abs Immature Granulocytes: 0.04 10*3/uL (ref 0.00–0.07)
Basophils Absolute: 0 10*3/uL (ref 0.0–0.1)
Basophils Relative: 0 %
Eosinophils Absolute: 0 10*3/uL (ref 0.0–0.5)
Eosinophils Relative: 0 %
HCT: 32.7 % — ABNORMAL LOW (ref 39.0–52.0)
Hemoglobin: 10.3 g/dL — ABNORMAL LOW (ref 13.0–17.0)
Immature Granulocytes: 1 %
Lymphocytes Relative: 7 %
Lymphs Abs: 0.5 10*3/uL — ABNORMAL LOW (ref 0.7–4.0)
MCH: 26.5 pg (ref 26.0–34.0)
MCHC: 31.5 g/dL (ref 30.0–36.0)
MCV: 84.1 fL (ref 80.0–100.0)
Monocytes Absolute: 0.5 10*3/uL (ref 0.1–1.0)
Monocytes Relative: 6 %
Neutro Abs: 6.7 10*3/uL (ref 1.7–7.7)
Neutrophils Relative %: 86 %
Platelets: 246 10*3/uL (ref 150–400)
RBC: 3.89 MIL/uL — ABNORMAL LOW (ref 4.22–5.81)
RDW: 18.1 % — ABNORMAL HIGH (ref 11.5–15.5)
WBC: 7.8 10*3/uL (ref 4.0–10.5)
nRBC: 0 % (ref 0.0–0.2)

## 2019-10-27 LAB — CULTURE, RESPIRATORY W GRAM STAIN

## 2019-10-27 LAB — BASIC METABOLIC PANEL
Anion gap: 9 (ref 5–15)
BUN: 31 mg/dL — ABNORMAL HIGH (ref 8–23)
CO2: 38 mmol/L — ABNORMAL HIGH (ref 22–32)
Calcium: 8.6 mg/dL — ABNORMAL LOW (ref 8.9–10.3)
Chloride: 89 mmol/L — ABNORMAL LOW (ref 98–111)
Creatinine, Ser: 1.16 mg/dL (ref 0.61–1.24)
GFR, Estimated: >60 mL/min (ref >60)
Glucose, Bld: 313 mg/dL — ABNORMAL HIGH (ref 70–99)
Potassium: 4.4 mmol/L (ref 3.5–5.1)
Sodium: 136 mmol/L (ref 135–145)

## 2019-10-27 LAB — PHOSPHORUS: Phosphorus: 4.5 mg/dL (ref 2.5–4.6)

## 2019-10-27 LAB — RESPIRATORY PANEL BY RT PCR (FLU A&B, COVID)
Influenza A by PCR: NEGATIVE
Influenza B by PCR: NEGATIVE
SARS Coronavirus 2 by RT PCR: NEGATIVE

## 2019-10-27 LAB — MAGNESIUM: Magnesium: 2.1 mg/dL (ref 1.7–2.4)

## 2019-10-27 MED ORDER — IPRATROPIUM-ALBUTEROL 0.5-2.5 (3) MG/3ML IN SOLN: 3.0000 mL | Freq: Four times a day (QID) | RESPIRATORY_TRACT | Status: DC | PRN

## 2019-10-27 MED ORDER — HYDROMORPHONE HCL 1 MG/ML IJ SOLN: 1.0000 mg | INTRAMUSCULAR | Status: DC | PRN

## 2019-10-27 MED ORDER — PREDNISONE 10 MG PO TABS: 40.0000 mg | ORAL_TABLET | Freq: Every day | ORAL | 0 refills | Status: AC

## 2019-10-27 MED ORDER — FUROSEMIDE 40 MG PO TABS
40.0000 mg | ORAL_TABLET | Freq: Every day | ORAL | 1 refills | Status: DC
Start: 2019-10-28 — End: 2019-12-13

## 2019-10-27 MED ORDER — FUROSEMIDE 40 MG PO TABS: 40.0000 mg | ORAL_TABLET | Freq: Every day | ORAL | Status: DC

## 2019-10-27 MED ORDER — FUROSEMIDE 20 MG PO TABS: 20.0000 mg | ORAL_TABLET | Freq: Two times a day (BID) | ORAL | Status: DC

## 2019-10-27 NOTE — Discharge Summary (Signed)
Gregory Hall [facility] NUU:[id number] DOB: 1955-05-24 DOA: 10/22/2019  PCP: Enid Baas, MD  Admit date: 10/22/2019 Discharge date: 10/27/2019  Time spent: 40 minutes  Recommendations for Outpatient Follow-up:  1. Close f/u with [facility] cardiology, [facility] pulmonology, and PCP 2. Will need assessment of kidney function and electrolytes within 1-2 weeks    Discharge Diagnoses:  Active Problems:   Acute on chronic respiratory failure with hypoxia and hypercapnia (HCC)   Palliative care by specialist   DNR (do not resuscitate)   Discharge Condition: fair  Diet recommendation: low sodium heart healthy  Filed Weights   10/25/19 0320 10/26/19 0300 10/27/19 0500  Weight: 92.4 kg 90.6 kg 90 kg    History of present illness:  From admission h & p: Gregory Hall is a 64 year old male with a past medical history significant for COPD on 3 L home O2, atrial fibrillation on Xarelto, hypertension, hyperlipidemia, CAD, HFpEF, CKD, diabetes mellitus, and chronic pain who presents to [facility] ED on 10/22/2019 from his group home due to complaints of altered mental status.  Patient is currently intubated and sedated and no family is present, therefore history is obtained from ED and nursing notes.  Per notes, group home staff noticed he was progressively confused throughout the day today.  He seemed to worsen after receiving his evening medications (on narcotics for his chronic pain) so EMS was called.  EMS gave a dose of Narcan without significant response.    Upon arrival to the ED he was noted to be quite somnolent but arousable to voice, complaining of pain in his legs but otherwise denies complaints.  Vital signs were reassuring, but he was noted to have wheezing on exam.  Initial work-up in the ED revealed potassium 5.3, bicarb 33, glucose 125, BUN 10, creatinine 1.21, high-sensitivity troponin 9, WBC 7.9, hemoglobin 11.5.  Venous blood gas with pH 7.22 /PCO2 112 /PO2 31/bicarb 45.8.  Given  his respiratory acidosis and somnolence he was placed on BiPAP.  He was given duo nebs and IV Solu-Medrol 125 mg.  His SARS-CoV-2 PCR is negative, influenza PCR is negative.  Chest x-ray showed stable chronic lung disease, with no acute superimposed findings.  Urinalysis is negative for UTI, and urine drug screen is negative.  Serum ethyl alcohol is less than 10.  Follow-up ABG post BiPAP is virtually unchanged and he became more somnolent, which he was subsequently intubated by ED provider.  CT head is negative for any acute intracranial process.  PCCM is asked to admit the patient for further work-up and treatment of acute metabolic encephalopathy and acute on chronic hypoxic hypercapnic respiratory failure in the setting of COPD exacerbation requiring intubation.   Hospital Course:  # COPD exacerbation, severe # Acute hypoxic hypercapnic respiratory failure on chronic Intubated in ED on 10/13, extubated 10/16. On 3 L [place] O2 at home, now breathing comfortably on home 3 L [place], pt says he is back to his baseline and insists on discharge today, despite my advise for remaining one more day as we discontinue IV meds and monitor. - cont ceftriaxone/azithromycin (10/15>10/18), procalcitonin low and rapid turn-around so will hold on further abx.  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 Strep pneumo neg - treated w/ IV lasix 20 mg IV bid, will increase home dose from 20 to 40 mg po qd as pt says 20 mg does not produce much urine - treated w/ methylpred 40 iv  bid, transition to prednisone to continue for 3 more days - hold home trelegy, roflumilast, prednisone, spiriva - Made DNR this admission  # Acute exacerbation of HFpEF Appears to be resolving - good diuresis here - lasix increase as above  # CAD - s/p stents - continued home bisoprolol, atorvastatin, aspirin  # History  a-fib/flutter, most recently dccv 09/2019 Appears in sinus rhythm here - continued home bisoprolol, amiodarone, rivaroxaban  # Mood disorder - cont home lamictal  # Chronic pain  Here complaining of significant chronic pain - cont home oxy 10 tid and flexeril and lyrica, added dilaudid for breakthrough pain  # T2DM Most recently glucose 163 - continued home lantus 22 qhs - SSI - held home metformin, will advise holding until pcp f/u given chf exacerbation  # Blood pressure Low BPs at home, here bp elevated, that resolved - held home midodrine, will resume on d/c - diuresis as above  # Esophagitis - cont home pantoprazole  # BPH - cont home flomax  # Insomnia - cont home trazodone  Procedures:  none  Consultations:  Critical care  Discharge Exam: Vitals:   10/27/19 0408 10/27/19 0726  BP: (!) 141/62 (!) 128/59  Pulse: 65 64  Resp: 20 15  Temp: 98.5 F (36.9 C) 98.8 F (37.1 C)  SpO2: 97% 93%    General exam: Appears calm, in mild pain. Chronically ill appearing Respiratory system: poor effort, scattered rhonchi Cardiovascular system: S1 & S2 heard, mod systolic murmur Gastrointestinal system: Abdomen is nondistended, soft and nontender. No organomegaly or masses felt. Normal bowel sounds heard. Central nervous system: Alert and oriented. No focal neurological deficits. Extremities: Symmetric 5 x 5 power. Trace LE edema. Mod upper extremity edema Skin: scattered echymoses extremities Psychiatry: Judgement and insight appear normal. Mood & affect appropriate.  Discharge Instructions   Discharge Instructions    Call MD for:  difficulty breathing, headache or visual disturbances   Complete by: As directed    Call MD for:  extreme fatigue   Complete by: As directed    Call MD for:  persistant dizziness or light-headedness   Complete by: As directed    Call MD for:  persistant nausea and vomiting   Complete by: As directed    Call MD for:   redness, tenderness, or signs of infection (pain, swelling, redness, odor or green/yellow discharge around incision site)   Complete by: As directed    Call MD for:  severe uncontrolled pain   Complete by: As directed    Call MD for:  temperature >100.4   Complete by: As directed    Diet - low sodium heart healthy   Complete by: As directed    Increase activity slowly   Complete by: As directed      Allergies as of 10/27/2019      Reactions   Acetaminophen Other (See Comments)   Reaction:  Unknown  Kidney failure Told not to take from home M.D. Related to kidney and renal failure   Fentanyl    Nsaids Other (See Comments)   Reaction:  Unknown  Kidney failure Patient states not to take from home M.D. Related to kidney and renal failure. Other reaction(s): Unknown      Medication List    STOP taking these medications   metFORMIN 750 MG 24 hr tablet Commonly known as: GLUCOPHAGE-XR     TAKE these medications   amiodarone 200 MG tablet Commonly known as: PACERONE Take 200 mg by mouth daily.  aspirin EC 81 MG tablet Take 81 mg by mouth daily.   atorvastatin 40 MG tablet Commonly known as: LIPITOR Take 1 tablet (40 mg total) by mouth daily.   bisoprolol 5 MG tablet Commonly known as: ZEBETA Take 2.5 mg by mouth daily.   cyclobenzaprine 5 MG tablet Commonly known as: FLEXERIL Take 1 tablet (5 mg total) by mouth 3 (three) times daily as needed. What changed:   when to take this  reasons to take this   DULoxetine 60 MG capsule Commonly known as: CYMBALTA Take 60 mg by mouth daily.   Ensure Max Protein Liqd Take 330 mLs (11 oz total) by mouth 2 (two) times daily between meals.   furosemide 40 MG tablet Commonly known as: LASIX Take 1 tablet (40 mg total) by mouth daily. Start taking on: October 28, 2019 What changed:   medication strength  how much to take   insulin glargine 100 UNIT/ML injection Commonly known as: LANTUS Inject 22 Units into the  skin at bedtime.   insulin lispro 100 UNIT/ML injection Commonly known as: HUMALOG Inject 4 Units into the skin See admin instructions. Inject 4 units with blood sugar 150-250; inject 6 units 251-350; greater than 351 call provider; Hold for FSBG <150   ipratropium-albuterol 0.5-2.5 (3) MG/3ML Soln Commonly known as: DUONEB Take 3 mLs by nebulization every 6 (six) hours as needed. What changed: when to take this   lactulose 10 GM/15ML solution Commonly known as: CHRONULAC Take 20 g by mouth daily as needed for mild constipation.   lamoTRIgine 100 MG tablet Commonly known as: LAMICTAL Take 100 mg by mouth daily.   midodrine 10 MG tablet Commonly known as: PROAMATINE Take 1 tablet (10 mg total) by mouth every 8 (eight) hours.   NAC 600 MG Caps Generic drug: Acetylcysteine Take 600 mg by mouth daily.   nicotine 21 mg/24hr patch Commonly known as: NICODERM CQ - dosed in mg/24 hours Place onto the skin.   Nitrostat 0.4 MG SL tablet Generic drug: nitroGLYCERIN DISSOLVE (1) TABLET UNDER TONGUE AS NEEDED TO RELIEVE CHEST PAIN. MAYREPEAT EVERY 5 MINUTES. What changed: See the new instructions.   Oxycodone HCl 10 MG Tabs Take 1 tablet (10 mg total) by mouth 3 (three) times daily.   oxymetazoline 0.05 % nasal spray Commonly known as: AFRIN Place 2 sprays into both nostrils 2 (two) times daily as needed for congestion.   pantoprazole 40 MG tablet Commonly known as: PROTONIX Take 40 mg by mouth daily.   predniSONE 5 MG tablet Commonly known as: DELTASONE Take 2.5 mg by mouth daily with breakfast. What changed: Another medication with the same name was added. Make sure you understand how and when to take each.   predniSONE 10 MG tablet Commonly known as: DELTASONE Take 4 tablets (40 mg total) by mouth daily for 3 days. What changed: You were already taking a medication with the same name, and this prescription was added. Make sure you understand how and when to take each.    pregabalin 200 MG capsule Commonly known as: LYRICA Take 1 capsule (200 mg total) by mouth 3 (three) times daily.   rivaroxaban 20 MG Tabs tablet Commonly known as: XARELTO Take 20 mg by mouth daily with supper.   roflumilast 500 MCG Tabs tablet Commonly known as: DALIRESP Take 250 mcg by mouth daily.   SALONPAS PAIN RELIEF PATCH EX Place 1 patch onto the skin every 12 (twelve) hours as needed (aches and pains).   senna-docusate 8.6-50  MG tablet Commonly known as: Senokot-S Take 1 tablet by mouth daily.   tamsulosin 0.4 MG Caps capsule Commonly known as: FLOMAX Take 1 capsule (0.4 mg total) by mouth daily after supper.   tiotropium 18 MCG inhalation capsule Commonly known as: SPIRIVA Place into inhaler and inhale.   traZODone 100 MG tablet Commonly known as: DESYREL Take 100 mg by mouth at bedtime.   Trelegy Ellipta 100-62.5-25 MCG/INH Aepb Generic drug: Fluticasone-Umeclidin-Vilant Inhale 1 puff into the lungs daily.   Vitamin D3 50 MCG (2000 UT) Tabs Take 2,000 Units by mouth daily.      Allergies  Allergen Reactions   Acetaminophen Other (See Comments)    Reaction:  Unknown  Kidney failure Told not to take from home M.D. Related to kidney and renal failure    Fentanyl    Nsaids Other (See Comments)    Reaction:  Unknown  Kidney failure Patient states not to take from home M.D. Related to kidney and renal failure.  Other reaction(s): Unknown    Follow-up Information    Your primary care provider Follow up.   Why: call for follow-up in 1-2 weeks       Vida Rigger, MD. Schedule an appointment as soon as possible for a visit.   Specialty: Pulmonary Disease Contact information: [address] [place] [address] [phone]        Iran Ouch, MD. Schedule an appointment as soon as possible for a visit.   Specialty: Cardiology Contact information: [address] [place] [address] [phone]                 The results of significant diagnostics from this hospitalization (including imaging, microbiology, ancillary and laboratory) are listed below for reference.    Significant Diagnostic Studies: DG Chest 1 View  Result Date: 10/22/2019 CLINICAL DATA:  Status post intubation EXAM: CHEST  1 VIEW COMPARISON:  10/22/2019 FINDINGS: Cardiac shadow is stable. Spinal stimulator is again noted. Gastric catheter is seen extending into the stomach. Endotracheal tube is noted in satisfactory position just above the aortic knob. Left basilar atelectasis is again seen. No new focal infiltrate is noted. IMPRESSION: Tubes and lines as described above. The remainder of the exam is stable in appearance. Electronically Signed   By: Alcide Clever M.D.   On: 10/22/2019 23:14   CT Head Wo Contrast  Result Date: 10/22/2019 CLINICAL DATA:  64 year old male with altered mental status. EXAM: CT HEAD WITHOUT CONTRAST TECHNIQUE: Contiguous axial images were obtained from the base of the skull through the vertex without intravenous contrast. COMPARISON:  Head CT dated 04/07/2019. FINDINGS: Brain: The ventricles and sulci appropriate size for patient's age. The gray-white matter discrimination is preserved. There is no acute intracranial hemorrhage. No mass effect or midline shift no extra-axial fluid collection. Vascular: No hyperdense vessel or unexpected calcification. Skull: Normal. Negative for fracture or focal lesion. Sinuses/Orbits: There is diffuse mucoperiosteal thickening of paranasal sinuses. Small left maxillary sinus air-fluid level. The mastoid air cells are clear. Other: None IMPRESSION: 1. No acute intracranial pathology. 2. Paranasal sinus disease. Electronically Signed   By: Elgie Collard M.D.   On: 10/22/2019 23:37   US Venous Img Lower Bilateral (DVT)  Result Date: 10/08/2019 CLINICAL DATA:  Bilateral lower extremity swelling for 3 weeks. EXAM: BILATERAL LOWER EXTREMITY VENOUS DOPPLER  ULTRASOUND TECHNIQUE: Gray-scale sonography with compression, as well as color and duplex ultrasound, were performed to evaluate the deep venous system(s) from the level of  the common femoral vein through the popliteal and proximal calf veins. COMPARISON:  07/14/2011 bilateral lower extremity venous Doppler scan. FINDINGS: VENOUS Normal compressibility of the common femoral, superficial femoral, and popliteal veins, as well as the visualized calf veins. Visualized portions of profunda femoral vein and great saphenous vein unremarkable. No filling defects to suggest DVT on grayscale or color Doppler imaging. Doppler waveforms show normal direction of venous flow, normal respiratory plasticity and response to augmentation. Limited views of the contralateral common femoral vein are unremarkable. OTHER None. Limitations: none IMPRESSION: No evidence of deep venous thrombosis in either lower extremity. Electronically Signed   By: Delbert Phenix M.D.   On: 10/08/2019 19:34   DG Chest Port 1 View  Result Date: 10/24/2019 CLINICAL DATA:  Acute respiratory failure EXAM: PORTABLE CHEST 1 VIEW COMPARISON:  10/22/2019 FINDINGS: Endotracheal tube is seen 6.5 cm above the carina. Nasogastric tube extends into the upper abdomen with its proximal side hole seen within the proximal stomach. The lungs are symmetrically well expanded. Mild left basilar atelectasis persists. Developing right basilar atelectasis or infiltrate is seen. No pneumothorax. Cardiac size within normal limits. Pulmonary vascularity is normal. No acute bone abnormality. Dorsal column stimulator leads are unchanged. IMPRESSION: Developing right basilar atelectasis or infiltrate. Stable left basilar atelectasis. Stable support tubes. Electronically Signed   By: Helyn Numbers MD   On: 10/24/2019 02:44   DG Chest Portable 1 View  Result Date: 10/22/2019 CLINICAL DATA:  64 year old male with altered mental status. On home oxygen. EXAM: PORTABLE CHEST 1 VIEW  COMPARISON:  Portable chest 10/08/2019 and earlier. FINDINGS: Portable AP upright view at 2050 hours. Stable lung volumes, mediastinal contours and thoracic spinal stimulator. Chronic lower lobe atelectasis and/or scarring. Chronic increased interstitial opacity in both lungs. Lung markings today appear at baseline. No pneumothorax or pleural effusion. Visualized tracheal air column is within normal limits. Partially visible cervical ACDF. No acute osseous abnormality identified. IMPRESSION: Stable chronic lung disease. No superimposed acute findings are identified. Electronically Signed   By: Odessa Fleming M.D.   On: 10/22/2019 21:21   DG Chest Portable 1 View  Result Date: 10/08/2019 CLINICAL DATA:  Shortness of breath.  History of CHF and COPD. EXAM: PORTABLE CHEST 1 VIEW COMPARISON:  Multiple priors, most recent 09/25/2019 FINDINGS: Patchy opacities in bilateral lung bases, new from prior. No visible pleural effusions. No pneumothorax. Similar cardiac silhouette. Aortic atherosclerosis. Left subclavian stent in similar position. Similar positioning of thoracic spinal stimulator leads in the midthoracic region. IMPRESSION: New bibasilar opacities, which may represent atelectasis, aspiration, and/or pneumonia. Electronically Signed   By: Feliberto Harts MD   On: 10/08/2019 15:57    Microbiology: Recent Results (from the past 240 hour(s))  Respiratory Panel by RT PCR (Flu A&B, Covid) - Nasopharyngeal Swab     Status: None   Collection Time: 10/22/19  9:22 PM   Specimen: Nasopharyngeal Swab  Result Value Ref Range Status   SARS Coronavirus 2 by RT PCR NEGATIVE NEGATIVE Final    Comment: (NOTE) SARS-CoV-2 target nucleic acids are NOT DETECTED.  The SARS-CoV-2 RNA is generally detectable in upper respiratoy specimens during the acute phase of infection. The lowest concentration of SARS-CoV-2 viral copies this assay can detect is 131 copies/mL. A negative result does not preclude SARS-Cov-2 infection  and should not be used as the sole basis for treatment or other patient management decisions. A negative result may occur with  improper specimen collection/handling, submission of specimen other than nasopharyngeal swab, presence of  viral mutation(s) within the areas targeted by this assay, and inadequate number of viral copies (<131 copies/mL). A negative result must be combined with clinical observations, patient history, and epidemiological information. The expected result is Negative.  Fact Sheet for Patients:  https://www.moore.com/  Fact Sheet for Healthcare Providers:  https://www.young.biz/  This test is no t yet approved or cleared by the [place] FDA and  has been authorized for detection and/or diagnosis of SARS-CoV-2 by FDA under an Emergency Use Authorization (EUA). This EUA will remain  in effect (meaning this test can be used) for the duration of the COVID-19 declaration under Section 564(b)(1) of the Act, 21 U.S.C. section 360bbb-3(b)(1), unless the authorization is terminated or revoked sooner.     Influenza A by PCR NEGATIVE NEGATIVE Final   Influenza B by PCR NEGATIVE NEGATIVE Final    Comment: (NOTE) The Xpert Xpress SARS-CoV-2/FLU/RSV assay is intended as an aid in  the diagnosis of influenza from Nasopharyngeal swab specimens and  should not be used as a sole basis for treatment. Nasal washings and  aspirates are unacceptable for Xpert Xpress SARS-CoV-2/FLU/RSV  testing.  Fact Sheet for Patients: https://www.moore.com/  Fact Sheet for Healthcare Providers: https://www.young.biz/  This test is not yet approved or cleared by the [place] FDA and  has been authorized for detection and/or diagnosis of SARS-CoV-2 by  FDA under an Emergency Use Authorization (EUA). This EUA will remain  in effect (meaning this test can be used) for the duration of the  Covid-19 declaration  under Section 564(b)(1) of the Act, 21  U.S.C. section 360bbb-3(b)(1), unless the authorization is  terminated or revoked. Performed at [facility], [address] [place], [place] [address]   Respiratory Panel by PCR     Status: None   Collection Time: 10/23/19 12:02 AM   Specimen: Nasopharyngeal Swab; Respiratory  Result Value Ref Range Status   Adenovirus NOT DETECTED NOT DETECTED Final   Coronavirus 229E NOT DETECTED NOT DETECTED Final    Comment: (NOTE) The Coronavirus on the Respiratory Panel, DOES NOT test for the novel  Coronavirus (2019 nCoV)    Coronavirus HKU1 NOT DETECTED NOT DETECTED Final   Coronavirus NL63 NOT DETECTED NOT DETECTED Final   Coronavirus OC43 NOT DETECTED NOT DETECTED Final   Metapneumovirus NOT DETECTED NOT DETECTED Final   Rhinovirus / Enterovirus NOT DETECTED NOT DETECTED Final   Influenza A NOT DETECTED NOT DETECTED Final   Influenza B NOT DETECTED NOT DETECTED Final   Parainfluenza Virus 1 NOT DETECTED NOT DETECTED Final   Parainfluenza Virus 2 NOT DETECTED NOT DETECTED Final   Parainfluenza Virus 3 NOT DETECTED NOT DETECTED Final   Parainfluenza Virus 4 NOT DETECTED NOT DETECTED Final   Respiratory Syncytial Virus NOT DETECTED NOT DETECTED Final   Bordetella pertussis NOT DETECTED NOT DETECTED Final   Chlamydophila pneumoniae NOT DETECTED NOT DETECTED Final   Mycoplasma pneumoniae NOT DETECTED NOT DETECTED Final    Comment: Performed at [facility], [address], [place], [place] [address]  Culture, respiratory     Status: None   Collection Time: 10/24/19  5:04 PM   Specimen: Tracheal Aspirate; Respiratory  Result Value Ref Range Status   Specimen Description   Final    TRACHEAL ASPIRATE Performed at [facility], [address] [place], [place] [address]    Special Requests   Final    NONE Performed at [facility], [address] [place], [place] [address]  Gram Stain   Final    FEW WBC  PRESENT, PREDOMINANTLY PMN ABUNDANT GRAM NEGATIVE RODS Performed at [facility], [address] [place], [place] [address]    Culture ABUNDANT STENOTROPHOMONAS MALTOPHILIA  Final   Report Status 10/27/2019 FINAL  Final   Organism ID, Bacteria STENOTROPHOMONAS MALTOPHILIA  Final      Susceptibility   Stenotrophomonas maltophilia - MIC*    LEVOFLOXACIN 0.5 SENSITIVE Sensitive     TRIMETH/SULFA <=20 SENSITIVE Sensitive     * ABUNDANT STENOTROPHOMONAS MALTOPHILIA  MRSA PCR Screening     Status: None   Collection Time: 10/24/19  8:53 PM   Specimen: Nasopharyngeal  Result Value Ref Range Status   MRSA by PCR NEGATIVE NEGATIVE Final    Comment:        The GeneXpert MRSA Assay (FDA approved for NASAL specimens only), is one component of a comprehensive MRSA colonization surveillance program. It is not intended to diagnose MRSA infection nor to guide or monitor treatment for MRSA infections. Performed at [facility], [address] [place], [place] [address]      Labs: Basic Metabolic Panel: Recent Labs  Lab 10/23/19 1241 10/24/19 0415 10/25/19 0505 10/26/19 0515 10/27/19 0203  NA 142 139 140 142 136  K 4.7 4.8 5.1 4.6 4.4  CL 98 98 97* 94* 89*  CO2 35* 35* 36* 37* 38*  GLUCOSE 150* 242* 232* 201* 313*  BUN 13 21 26* 30* 31*  CREATININE 1.04 1.04 0.90 0.97 1.16  CALCIUM 8.6* 8.6* 8.9 8.8* 8.6*  MG  --   --  2.3 2.1 2.1  PHOS  --   --  4.4 4.4 4.5   Liver Function Tests: Recent Labs  Lab 10/22/19 2037 10/23/19 0411  AST 17 POSSIBLE CONTAMINATION  ALT 8 POSSIBLE CONTAMINATION  ALKPHOS 109 POSSIBLE CONTAMINATION  BILITOT 0.7  --   PROT 7.2 POSSIBLE CONTAMINATION  ALBUMIN 3.8 POSSIBLE CONTAMINATION   No results for input(s): LIPASE, AMYLASE in the last 168 hours. No results for input(s): AMMONIA in the last 168 hours. CBC: Recent Labs  Lab 10/23/19 0411 10/24/19 0415 10/25/19 0505 10/26/19 0515 10/27/19 0203  WBC 5.9 10.4 9.3 10.4 7.8   NEUTROABS 5.1 9.1* 8.2* 9.3* 6.7  HGB 8.8* 9.3* 9.4* 10.7* 10.3*  HCT 31.2* 31.5* 31.1* 35.1* 32.7*  MCV 93.1 88.2 87.9 85.4 84.1  PLT 179 257 211 252 246   Cardiac Enzymes: No results for input(s): CKTOTAL, CKMB, CKMBINDEX, TROPONINI in the last 168 hours. BNP: BNP (last 3 results) Recent Labs    10/08/19 1530 10/10/19 0622 10/22/19 2031  BNP 513.8* 276.0* 198.3*    ProBNP (last 3 results) No results for input(s): PROBNP in the last 8760 hours.  CBG: Recent Labs  Lab 10/26/19 1953 10/27/19 0009 10/27/19 0402 10/27/19 0727 10/27/19 1133  GLUCAP 158* 144* 261* 85 280*       Signed:  Silvano Bilis MD.  [facility] 10/27/2019, 1:00 PM

## 2019-10-27 NOTE — NC FL2 (Signed)
[place] MEDICAID FL2 LEVEL OF CARE SCREENING TOOL     IDENTIFICATION  Patient Name: Gregory Hall Birthdate: [birth year] Sex: male Admission Date (Current Location): 10/22/2019  [place] and [place] Number:  Chiropodist and Address:  [facility], [address] [place], [place] [address]      Provider Number: [id number]  Attending Physician Name and Address:  Kathrynn Running, MD  Relative Name and Phone Number:  Charletta Cousin [phone]    Current Level of Care: Hospital Recommended Level of Care: Assisted Living Facility Prior Approval Number:    Date Approved/Denied:   PASRR Number:    Discharge Plan: Other (Comment) (ALF)    Current Diagnoses: Patient Active Problem List   Diagnosis Date Noted  . Palliative care by specialist   . DNR (do not resuscitate)   . Acute CHF (congestive heart failure) (HCC) 10/08/2019  . Chronic respiratory failure with hypoxia (HCC) 10/08/2019  . Atrial flutter (HCC)   . Acute on chronic respiratory failure with hypoxia (HCC) 09/13/2019  . Acute diastolic CHF (congestive heart failure) (HCC) 09/12/2019  . NSTEMI (non-ST elevated myocardial infarction) (HCC) 09/12/2019  . Acute kidney failure, unspecified (HCC) 09/12/2019  . Diabetes mellitus type 2, uncontrolled, with complications (HCC) 06/10/2019  . Chronic pain syndrome 06/10/2019  . COPD exacerbation (HCC) 06/10/2019  . COPD with acute exacerbation (HCC) 06/09/2019  . CAD (coronary artery disease) 06/09/2019  . HTN (hypertension) 06/09/2019  . Acute on chronic respiratory failure with hypoxia and hypercapnia (HCC) 06/09/2019  . Anxiety 06/09/2019  . Chronic prescription opiate use 06/09/2019  . Hyperglycemia 06/09/2019  . Subclavian artery stenosis, left (HCC) 08/20/2014  . Rectus diastasis 07/19/2012  . Hernia   . Edema 05/10/2011  . Goals of care, counseling/discussion 10/06/2010  . SMOKER 11/25/2009  . CAROTID BRUIT, RIGHT  11/24/2009  . Chest pain 11/24/2009  . Hyperlipidemia 03/30/2009  . Coronary atherosclerosis 03/30/2009  . HYPERTENSION, HX OF 03/30/2009    Orientation RESPIRATION BLADDER Height & Weight     Self, Time, Situation, Place  O2 (3L nasal canula) Continent Weight: 90 kg Height:  6' (182.9 cm)  BEHAVIORAL SYMPTOMS/MOOD NEUROLOGICAL BOWEL NUTRITION STATUS      Continent Diet (Heart Healthy)  AMBULATORY STATUS COMMUNICATION OF NEEDS Skin   Independent Verbally Normal                       Personal Care Assistance Level of Assistance  Bathing, Feeding, Dressing Bathing Assistance: Independent Feeding assistance: Independent Dressing Assistance: Independent     Functional Limitations Info             SPECIAL CARE FACTORS FREQUENCY  PT (By licensed PT), OT (By licensed OT)                    Contractures Contractures Info: Not present    Additional Factors Info  Code Status, Allergies Code Status Info: DNR Allergies Info: Acetaminophen, Fentanyl, NSAIDS           Current Medications (10/27/2019):  This is the current hospital active medication list Current Facility-Administered Medications  Medication Dose Route Frequency Provider Last Rate Last Admin  . 0.9 %  sodium chloride infusion  250 mL Intravenous Continuous Judithe Modest, NP   Stopped at 10/23/19 0416  . acetaminophen (TYLENOL) 160 MG/5ML solution 650 mg  650 mg Per Tube Q6H PRN Judithe Modest, NP   650 mg at 10/23/19 0207  . amiodarone (  PACERONE) tablet 200 mg  200 mg Oral Daily Kathrynn Running, MD   200 mg at 10/27/19 0845  . aspirin EC tablet 81 mg  81 mg Oral Daily Kathrynn Running, MD   81 mg at 10/27/19 4315  . atorvastatin (LIPITOR) tablet 40 mg  40 mg Oral Q2000 Kathrynn Running, MD   40 mg at 10/26/19 2045  . azithromycin (ZITHROMAX) 200 MG/5ML suspension 500 mg  500 mg Oral Daily Erin Fulling, MD   500 mg at 10/27/19 0847  . bisoprolol (ZEBETA) tablet 2.5 mg  2.5 mg Oral Daily  Kathrynn Running, MD   2.5 mg at 10/27/19 4008  . cefTRIAXone (ROCEPHIN) 1 g in sodium chloride 0.9 % 100 mL IVPB  1 g Intravenous Q24H Vida Rigger, MD   Stopped at 10/25/19 1236  . chlorhexidine (PERIDEX) 0.12 % solution 15 mL  15 mL Mouth Rinse BID Vida Rigger, MD   15 mL at 10/27/19 0958  . Chlorhexidine Gluconate Cloth 2 % PADS 6 each  6 each Topical Daily Erin Fulling, MD   6 each at 10/27/19 0959  . cyclobenzaprine (FLEXERIL) tablet 5 mg  5 mg Oral TID PRN Wouk, Wilfred Curtis, MD      . diphenhydrAMINE (BENADRYL) injection 50 mg  50 mg Intravenous TID Vida Rigger, MD   50 mg at 10/27/19 0848  . docusate (COLACE) 50 MG/5ML liquid 100 mg  100 mg Oral BID Erin Fulling, MD   100 mg at 10/27/19 0847  . feeding supplement (PROSource TF) liquid 45 mL  45 mL Per Tube BID Erin Fulling, MD   45 mL at 10/27/19 0959  . feeding supplement (VITAL AF 1.2 CAL) liquid 1,000 mL  1,000 mL Per Tube Continuous Vida Rigger, MD 60 mL/hr at 10/24/19 2226 1,000 mL at 10/24/19 2226  . [START ON 10/28/2019] furosemide (LASIX) tablet 40 mg  40 mg Oral Daily Wouk, Wilfred Curtis, MD      . HYDROmorphone (DILAUDID) injection 1-2 mg  1-2 mg Intravenous Q3H PRN Wouk, Wilfred Curtis, MD      . insulin aspart (novoLOG) injection 0-15 Units  0-15 Units Subcutaneous Q4H Harlon Ditty D, NP   8 Units at 10/27/19 0500  . insulin glargine (LANTUS) injection 22 Units  22 Units Subcutaneous QHS Kathrynn Running, MD   22 Units at 10/26/19 2357  . ipratropium-albuterol (DUONEB) 0.5-2.5 (3) MG/3ML nebulizer solution 3 mL  3 mL Nebulization Q4H PRN Harlon Ditty D, NP      . lactulose (CHRONULAC) 10 GM/15ML solution 20 g  20 g Oral Daily PRN Wouk, Wilfred Curtis, MD      . lamoTRIgine (LAMICTAL) tablet 100 mg  100 mg Oral Daily Kathrynn Running, MD   100 mg at 10/27/19 0840  . MEDLINE mouth rinse  15 mL Mouth Rinse q12n4p Vida Rigger, MD   15 mL at 10/25/19 1623  . methylPREDNISolone sodium succinate (SOLU-MEDROL)  40 mg/mL injection 40 mg  40 mg Intravenous Q12H Erin Fulling, MD   40 mg at 10/27/19 0848  . ondansetron (ZOFRAN) injection 4 mg  4 mg Intravenous Q6H PRN Eugenie Norrie, NP   4 mg at 10/25/19 2044  . oxyCODONE (Oxy IR/ROXICODONE) immediate release tablet 10 mg  10 mg Oral TID Kathrynn Running, MD   10 mg at 10/27/19 0957  . pantoprazole (PROTONIX) EC tablet 40 mg  40 mg Oral Daily Kathrynn Running, MD   40 mg at 10/27/19 0845  .  polyethylene glycol (MIRALAX / GLYCOLAX) packet 17 g  17 g Per Tube Daily PRN Hall, Scott A, RPH      . polyethylene glycol (MIRALAX / GLYCOLAX) packet 17 g  17 g Oral Daily Erin Fulling, MD   17 g at 10/27/19 0846  . pregabalin (LYRICA) capsule 200 mg  200 mg Oral TID Kathrynn Running, MD   200 mg at 10/27/19 0841  . rivaroxaban (XARELTO) tablet 20 mg  20 mg Per Tube Q supper Erin Fulling, MD   20 mg at 10/26/19 2052  . senna-docusate (Senokot-S) tablet 1 tablet  1 tablet Oral Daily Kathrynn Running, MD   1 tablet at 10/27/19 0844  . sodium chloride flush (NS) 0.9 % injection 10-40 mL  10-40 mL Intracatheter PRN Erin Fulling, MD      . tamsulosin (FLOMAX) capsule 0.4 mg  0.4 mg Oral QPC supper Kathrynn Running, MD   0.4 mg at 10/26/19 1608  . traZODone (DESYREL) tablet 100 mg  100 mg Oral QHS Kathrynn Running, MD   100 mg at 10/26/19 2357     Discharge Medications: Please see discharge summary for a list of discharge medications.  Relevant Imaging Results:  Relevant Lab Results:   Additional Information SS# [id number]  Trenton Founds, RN

## 2019-10-27 NOTE — NC FL2 (Addendum)
[place] MEDICAID FL2 LEVEL OF CARE SCREENING TOOL     IDENTIFICATION  Patient Name: Gregory Hall Birthdate: [birth year] Sex: male Admission Date (Current Location): 10/22/2019  [place] and [place] Number:  Chiropodist and Address:  [facility], [address], [place], [place] [address]      Provider Number: [id number]  Attending Physician Name and Address:  Kathrynn Running, MD  Relative Name and Phone Number:  Charletta Cousin [phone]    Current Level of Care: Hospital Recommended Level of Care: Assisted Living Facility Prior Approval Number:    Date Approved/Denied:   PASRR Number:    Discharge Plan: Other (Comment) (ALF)    Current Diagnoses: Patient Active Problem List   Diagnosis Date Noted  . Palliative care by specialist   . DNR (do not resuscitate)   . Acute CHF (congestive heart failure) (HCC) 10/08/2019  . Chronic respiratory failure with hypoxia (HCC) 10/08/2019  . Atrial flutter (HCC)   . Acute on chronic respiratory failure with hypoxia (HCC) 09/13/2019  . Acute diastolic CHF (congestive heart failure) (HCC) 09/12/2019  . NSTEMI (non-ST elevated myocardial infarction) (HCC) 09/12/2019  . Acute kidney failure, unspecified (HCC) 09/12/2019  . Diabetes mellitus type 2, uncontrolled, with complications (HCC) 06/10/2019  . Chronic pain syndrome 06/10/2019  . COPD exacerbation (HCC) 06/10/2019  . COPD with acute exacerbation (HCC) 06/09/2019  . CAD (coronary artery disease) 06/09/2019  . HTN (hypertension) 06/09/2019  . Acute on chronic respiratory failure with hypoxia and hypercapnia (HCC) 06/09/2019  . Anxiety 06/09/2019  . Chronic prescription opiate use 06/09/2019  . Hyperglycemia 06/09/2019  . Subclavian artery stenosis, left (HCC) 08/20/2014  . Rectus diastasis 07/19/2012  . Hernia   . Edema 05/10/2011  . Goals of care, counseling/discussion 10/06/2010  . SMOKER 11/25/2009  . CAROTID BRUIT, RIGHT  11/24/2009  . Chest pain 11/24/2009  . Hyperlipidemia 03/30/2009  . Coronary atherosclerosis 03/30/2009  . HYPERTENSION, HX OF 03/30/2009    Orientation RESPIRATION BLADDER Height & Weight     Self, Time, Situation, Place  O2 (3L nasal canula) Continent Weight: 90 kg Height:  6' (182.9 cm)  BEHAVIORAL SYMPTOMS/MOOD NEUROLOGICAL BOWEL NUTRITION STATUS      Continent Diet (Heart Healthy)  AMBULATORY STATUS COMMUNICATION OF NEEDS Skin   Independent Verbally Normal                       Personal Care Assistance Level of Assistance  Bathing, Feeding, Dressing Bathing Assistance: Independent Feeding assistance: Independent Dressing Assistance: Independent     Functional Limitations Info             SPECIAL CARE FACTORS FREQUENCY  PT (By licensed PT), OT (By licensed OT)                    Contractures Contractures Info: Not present    Additional Factors Info  Code Status, Allergies Code Status Info: DNR Allergies Info: Acetaminophen, Fentanyl, NSAIDS           Current Medications (10/27/2019):   Medication List    STOP taking these medications   metFORMIN 750 MG 24 hr tablet Commonly known as: GLUCOPHAGE-XR     TAKE these medications   amiodarone 200 MG tablet Commonly known as: PACERONE Take 200 mg by mouth daily.   aspirin EC 81 MG tablet Take 81 mg by mouth daily.   atorvastatin 40 MG tablet Commonly known as: LIPITOR Take 1 tablet (40 mg total)  by mouth daily.   bisoprolol 5 MG tablet Commonly known as: ZEBETA Take 2.5 mg by mouth daily.   cyclobenzaprine 5 MG tablet Commonly known as: FLEXERIL Take 1 tablet (5 mg total) by mouth 3 (three) times daily as needed. What changed:   when to take this  reasons to take this   DULoxetine 60 MG capsule Commonly known as: CYMBALTA Take 60 mg by mouth daily.   Ensure Max Protein Liqd Take 330 mLs (11 oz total) by mouth 2 (two) times daily between meals.   furosemide 40 MG  tablet Commonly known as: LASIX Take 1 tablet (40 mg total) by mouth daily. Start taking on: October 28, 2019 What changed:   medication strength  how much to take   insulin glargine 100 UNIT/ML injection Commonly known as: LANTUS Inject 22 Units into the skin at bedtime.   insulin lispro 100 UNIT/ML injection Commonly known as: HUMALOG Inject 4 Units into the skin See admin instructions. Inject 4 units with blood sugar 150-250; inject 6 units 251-350; greater than 351 call provider; Hold for FSBG <150   ipratropium-albuterol 0.5-2.5 (3) MG/3ML Soln Commonly known as: DUONEB Take 3 mLs by nebulization every 6 (six) hours as needed. What changed: when to take this   lactulose 10 GM/15ML solution Commonly known as: CHRONULAC Take 20 g by mouth daily as needed for mild constipation.   lamoTRIgine 100 MG tablet Commonly known as: LAMICTAL Take 100 mg by mouth daily.   midodrine 10 MG tablet Commonly known as: PROAMATINE Take 1 tablet (10 mg total) by mouth every 8 (eight) hours.   NAC 600 MG Caps Generic drug: Acetylcysteine Take 600 mg by mouth daily.   nicotine 21 mg/24hr patch Commonly known as: NICODERM CQ - dosed in mg/24 hours Place onto the skin.   Nitrostat 0.4 MG SL tablet Generic drug: nitroGLYCERIN DISSOLVE (1) TABLET UNDER TONGUE AS NEEDED TO RELIEVE CHEST PAIN. MAYREPEAT EVERY 5 MINUTES. What changed: See the new instructions.   Oxycodone HCl 10 MG Tabs Take 1 tablet (10 mg total) by mouth 3 (three) times daily.   oxymetazoline 0.05 % nasal spray Commonly known as: AFRIN Place 2 sprays into both nostrils 2 (two) times daily as needed for congestion.   pantoprazole 40 MG tablet Commonly known as: PROTONIX Take 40 mg by mouth daily.   predniSONE 5 MG tablet Commonly known as: DELTASONE Take 2.5 mg by mouth daily with breakfast. What changed: Another medication with the same name was added. Make sure you understand how and when to take  each.   predniSONE 10 MG tablet Commonly known as: DELTASONE Take 4 tablets (40 mg total) by mouth daily for 3 days. What changed: You were already taking a medication with the same name, and this prescription was added. Make sure you understand how and when to take each.   pregabalin 200 MG capsule Commonly known as: LYRICA Take 1 capsule (200 mg total) by mouth 3 (three) times daily.   rivaroxaban 20 MG Tabs tablet Commonly known as: XARELTO Take 20 mg by mouth daily with supper.   roflumilast 500 MCG Tabs tablet Commonly known as: DALIRESP Take 250 mcg by mouth daily.   SALONPAS PAIN RELIEF PATCH EX Place 1 patch onto the skin every 12 (twelve) hours as needed (aches and pains).   senna-docusate 8.6-50 MG tablet Commonly known as: Senokot-S Take 1 tablet by mouth daily.   tamsulosin 0.4 MG Caps capsule Commonly known as: FLOMAX Take 1 capsule (0.4  mg total) by mouth daily after supper.   tiotropium 18 MCG inhalation capsule Commonly known as: SPIRIVA Place into inhaler and inhale.   traZODone 100 MG tablet Commonly known as: DESYREL Take 100 mg by mouth at bedtime.   Trelegy Ellipta 100-62.5-25 MCG/INH Aepb Generic drug: Fluticasone-Umeclidin-Vilant Inhale 1 puff into the lungs daily.   Vitamin D3 50 MCG (2000 UT) Tabs Take 2,000 Units by mouth daily.       Discharge Medications: Please see discharge summary for a list of discharge medications.  Relevant Imaging Results:  Relevant Lab Results:   Additional Information SS# [id number]  Trenton Founds, RN

## 2019-10-27 NOTE — TOC Progression Note (Signed)
Transition of Care [facility]) - Progression Note    Patient Details  Name: Gregory Hall MRN: [id number] Date of Birth: 12-26-1955  Transition of Care [facility]) CM/SW [place], RN Phone Number: 10/27/2019, 1:37 PM  Clinical Narrative:   RNCM met with patient at bedside, patient is back to his baseline 3L of oxygen. Patient reports that he does reside at [facility] assisted living facility. RNCM placed call to facility and spoke with Anne Ng, she is prepared to receive patient back today and requests that do to his oxygen demands he be transported by EMS. RNCM completed EMS packet and will call for transport when ready.          Expected Discharge Plan and Services           Expected Discharge Date: 10/27/19                                     Social Determinants of Health (SDOH) Interventions    Readmission Risk Interventions Readmission Risk Prevention Plan 09/24/2019 09/14/2019  Transportation Screening Complete Complete  PCP or Specialist Appt within 3-5 Days - Complete  HRI or [place] - Complete  Social Work Consult for [place] Planning/Counseling - Complete  Palliative Care Screening - Not Applicable  Medication Review Press photographer) Complete Complete  PCP or Specialist appointment within 3-5 days of discharge Complete -  [place] or Home Care Consult Complete -  Palliative Care Screening Complete -  [place] Complete -  Some recent data might be hidden

## 2019-10-27 NOTE — Progress Notes (Signed)
Inpatient Diabetes Program Recommendations  AACE/ADA: New Consensus Statement on Inpatient Glycemic Control   Target Ranges:  Prepandial:   less than 140 mg/dL      Peak postprandial:   less than 180 mg/dL (1-2 hours)      Critically ill patients:  140 - 180 mg/dL   Results for ELISA, SORLIE (MRN [id number]) as of 10/27/2019 09:19  Ref. Range 10/26/2019 07:35 10/26/2019 11:30 10/26/2019 16:37 10/26/2019 19:53 10/27/2019 00:09 10/27/2019 04:02 10/27/2019 07:27  Glucose-Capillary Latest Ref Range: 70 - 99 mg/dL 409 (H) 811 (H) 914 (H) 158 (H) 144 (H) 261 (H) 85   Review of Glycemic Control  Diabetes history: DM2 Outpatient Diabetes medications: Lantus 22 units QHS, Novolog 4 units TID with meals, Metformin 750 mg BID Current orders for Inpatient glycemic control: Lantus 22 units QHS, Novolog 0-15 units Q4H; Solumedrol 40 mg Q12H  Inpatient Diabetes Program Recommendations:    Insulin: If patient is eating well, please consider changing CBGs to AC&HS and Novolog to 0-15 units TID with meals and Novolog 0-5 units QHS. If steroids are continued, please consider ordering Novolog 3 units TID with meals for meal coverage if patient eats at least 50% of meals.  Thanks, Orlando Penner, RN, MSN, CDE Diabetes Coordinator Inpatient Diabetes Program [phone] (Team Pager from 8am to 5pm)

## 2019-10-27 NOTE — Progress Notes (Addendum)
PHARMACY CONSULT NOTE - FOLLOW UP  Pharmacy Consult for Electrolyte Monitoring and Replacement   Recent Labs: Potassium (mmol/L)  Date Value  10/27/2019 4.4  03/31/2013 3.6   Magnesium (mg/dL)  Date Value  81/27/5170 2.1  06/25/2012 2.4   Calcium (mg/dL)  Date Value  01/74/9449 8.6 (L)   Calcium, Total (mg/dL)  Date Value  67/59/1638 7.6 (L)   Albumin (g/dL)  Date Value  46/65/9935 POSSIBLE CONTAMINATION  10/06/2019 3.9  03/30/2013 3.2 (L)   Phosphorus (mg/dL)  Date Value  70/17/7939 4.5  03/20/2012 3.4   Sodium (mmol/L)  Date Value  10/27/2019 136  10/17/2019 142  03/31/2013 142   Assessment: 64 year old male admitted with respiratory acidosis failing BiPAP and requiring intubation.extubated 10/16. Pharmacy consulted for electrolyte management.  Goal of Therapy:  [place]ytes WNL, K >/= 4  Plan:  Patient on lasix 20 mg IV bid. No electrolyte replacement indicated.   Pharmacy will sign off at this time since consult was for monitoring while in CCU.   Gardner Candle, PharmD, BCPS Clinical Pharmacist 10/27/2019 8:08 AM

## 2019-10-28 LAB — FUNGITELL, SERUM: Fungitell Result: <31 pg/mL (ref <80)

## 2019-10-28 LAB — LEGIONELLA PNEUMOPHILA TOTAL AB: Legionella Pneumo Total Ab: <0.91 OD ratio (ref 0.00–0.90)

## 2019-10-29 ENCOUNTER — Telehealth: Payer: Self-pay | Admitting: Cardiovascular Disease

## 2019-10-29 LAB — BLOOD GAS, ARTERIAL
Acid-Base Excess: 11.6 mmol/L — ABNORMAL HIGH (ref 0.0–2.0)
Bicarbonate: 39 mmol/L — ABNORMAL HIGH (ref 20.0–28.0)
FIO2: 0.5
MECHVT: 500 mL
O2 Saturation: 98.8 %
PEEP: 5 cmH2O
Patient temperature: 37
RATE: 24 resp/min
pCO2 arterial: 66 mmHg (ref 32.0–48.0)
pH, Arterial: 7.38 (ref 7.350–7.450)
pO2, Arterial: 127 mmHg — ABNORMAL HIGH (ref 83.0–108.0)

## 2019-10-29 NOTE — Telephone Encounter (Signed)
Brandi at [facility] calling  Needs a medication clarification on amiodarone 200 MG - patient has been told to discontinue and continue several times by different providers Please call to discuss

## 2019-10-29 NOTE — Telephone Encounter (Addendum)
Patients recent 10/27/19 hospital d/c summary sts that the patient should take Amiodarone 200mg  daily. Amiodarone was d/c by , PA on 10/06/19.  Patient was seen by 10/08/19, PA on 10/17/19 and he is sch to see her on 11/03/19. Will route msg to JV to give clarification on Amiodarone.

## 2019-10-31 NOTE — Telephone Encounter (Signed)
This case was discussed with the DOD and recommendation was to continue his amiodarone 200 mg daily.  Please ensure that he continues his amiodarone.

## 2019-10-31 NOTE — Telephone Encounter (Signed)
Spoke with Brandi at [facility].  Adv her that the patient should continue Amiodarone 200 mg daily as listed on his recent hospital discharge paperwork.  He is to f/u with cariology as planned. Brandi verbalized understanding and voiced appreciation for the call back.

## 2019-11-03 ENCOUNTER — Ambulatory Visit (INDEPENDENT_AMBULATORY_CARE_PROVIDER_SITE_OTHER): Payer: Medicare Other | Admitting: Physician Assistant

## 2019-11-03 ENCOUNTER — Other Ambulatory Visit: Payer: Self-pay

## 2019-11-03 ENCOUNTER — Encounter: Payer: Self-pay | Admitting: Physician Assistant

## 2019-11-03 VITALS — BP 110/52 | HR 78 | Ht 72.0 in | Wt 194.0 lb

## 2019-11-03 DIAGNOSIS — I4892 Unspecified atrial flutter: Secondary | ICD-10-CM

## 2019-11-03 DIAGNOSIS — E785 Hyperlipidemia, unspecified: Secondary | ICD-10-CM

## 2019-11-03 DIAGNOSIS — G894 Chronic pain syndrome: Secondary | ICD-10-CM

## 2019-11-03 DIAGNOSIS — I10 Essential (primary) hypertension: Secondary | ICD-10-CM | POA: Diagnosis not present

## 2019-11-03 DIAGNOSIS — R251 Tremor, unspecified: Secondary | ICD-10-CM

## 2019-11-03 DIAGNOSIS — I5032 Chronic diastolic (congestive) heart failure: Secondary | ICD-10-CM | POA: Diagnosis not present

## 2019-11-03 DIAGNOSIS — I6523 Occlusion and stenosis of bilateral carotid arteries: Secondary | ICD-10-CM

## 2019-11-03 DIAGNOSIS — E118 Type 2 diabetes mellitus with unspecified complications: Secondary | ICD-10-CM

## 2019-11-03 DIAGNOSIS — E1165 Type 2 diabetes mellitus with hyperglycemia: Secondary | ICD-10-CM

## 2019-11-03 DIAGNOSIS — N183 Chronic kidney disease, stage 3 unspecified: Secondary | ICD-10-CM

## 2019-11-03 DIAGNOSIS — J449 Chronic obstructive pulmonary disease, unspecified: Secondary | ICD-10-CM

## 2019-11-03 DIAGNOSIS — Z720 Tobacco use: Secondary | ICD-10-CM

## 2019-11-03 DIAGNOSIS — I771 Stricture of artery: Secondary | ICD-10-CM

## 2019-11-03 DIAGNOSIS — I2510 Atherosclerotic heart disease of native coronary artery without angina pectoris: Secondary | ICD-10-CM | POA: Diagnosis not present

## 2019-11-03 DIAGNOSIS — IMO0002 Reserved for concepts with insufficient information to code with codable children: Secondary | ICD-10-CM

## 2019-11-03 MED ORDER — AMIODARONE HCL 200 MG PO TABS: 200.0000 mg | ORAL_TABLET | Freq: Every day | ORAL | 6 refills | Status: AC

## 2019-11-03 NOTE — Patient Instructions (Addendum)
Medication Instructions:  - Your physician has recommended you make the following change in your medication:   1) RE-START amiodarone 200 mg- take 1 tablet by mouth once daily   2) STOP aspirin  *If you need a refill on your cardiac medications before your next appointment, please call your pharmacy*   Lab Work: - Your physician recommends that you have lab work today: CMET/ CBC  If you have labs (blood work) drawn today and your tests are completely normal, you will receive your results only by: Marland Kitchen MyChart Message (if you have MyChart) OR . A paper copy in the mail If you have any lab test that is abnormal or we need to change your treatment, we will call you to review the results.   Testing/Procedures: - You have been referred to : Neurology- Dr. Sherryll Burger - you should hear directly from their office with an appointment    Follow-Up: At [facility], you and your health needs are our priority.  As part of our continuing mission to provide you with exceptional heart care, we have created designated Provider Care Teams.  These Care Teams include your primary Cardiologist (physician) and Advanced Practice Providers (APPs -  Physician Assistants and Nurse Practitioners) who all work together to provide you with the care you need, when you need it.  We recommend signing up for the patient portal called "MyChart".  Sign up information is provided on this After Visit Summary.  MyChart is used to connect with patients for Virtual Visits (Telemedicine).  Patients are able to view lab/test results, encounter notes, upcoming appointments, etc.  Non-urgent messages can be sent to your provider as well.   To learn more about what you can do with MyChart, go to [email or web address].    Your next appointment:   1 month(s)  The format for your next appointment:   In Person  Provider:   You may see Lorine Bears, MD or one of the following Advanced Practice Providers on your designated Care  Team:    Nicolasa Ducking, NP  Eula Listen, PA-C  Marisue Ivan, PA-C  Cadence Fransico Michael, [place]    Other Instructions  n/a

## 2019-11-03 NOTE — Progress Notes (Signed)
Office Visit    Patient Name: Gregory Hall Date of Encounter: 11/03/2019  Primary Care Provider:  Enid Baas, MD Primary Cardiologist:  Lorine Bears, MD  Chief Complaint    Chief Complaint  Patient presents with  . Follow-up    2 Week follow up. Medications verbally reviewed with patient.     64 year old male with history of CAD s/p PCI to the LAD in 2010, HFpEF, atrial flutter s/p DCCV 09/26/2019, PAD with prior stenting to the left subclavian artery, CKD stage III, IDDM, hypertension, hyperlipidemia, psychiatric history, chronic hypoxic respiratory failure on supplemental home oxygen secondary to COPD secondary to tobacco use, esophageal spasm, erosive gastritis, GERD, anxiety/depression, chronic back pain, and who presents today for follow-up after recent repeat admission for acute on chronic HFpEF.  Past Medical History    Past Medical History:  Diagnosis Date  . AKI (acute kidney injury) (HCC) 09/12/2019  . CAD (coronary artery disease)    s/p PTCA and stent x2  . Chest pain   . CHF (congestive heart failure) (HCC)   . Chronic pain syndrome   . COPD (chronic obstructive pulmonary disease) (HCC)   . Degenerative cervical disc   . Depression   . Diabetes mellitus without complication (HCC)   . Dyslipidemia   . GERD (gastroesophageal reflux disease)   . Hernia 2014  . Hypertension   . MRSA (methicillin resistant staph aureus) culture positive 2011  . Neuropathy   . Nutcracker esophagus   . Rectus diastasis 07/19/2012   Past Surgical History:  Procedure Laterality Date  . BACK SURGERY  2012  . CARDIOVERSION N/A 09/26/2019   Procedure: CARDIOVERSION;  Surgeon: Antonieta Iba, MD;  Location: [facility] ORS;  Service: Cardiovascular;  Laterality: N/A;  . CHOLECYSTECTOMY    . COLONOSCOPY  Jan 2014   [name]  . CORONARY ANGIOPLASTY WITH STENT PLACEMENT  2009   stents x2, in [place], [place]  . FOOT SURGERY     Right  . NECK SURGERY    . SPINE SURGERY   2012,2013  . TONSILLECTOMY      Allergies  Allergies  Allergen Reactions  . Acetaminophen Other (See Comments)    Reaction:  Unknown  Kidney failure Told not to take from home M.D. Related to kidney and renal failure   . Fentanyl   . Nsaids Other (See Comments)    Reaction:  Unknown  Kidney failure Patient states not to take from home M.D. Related to kidney and renal failure.  Other reaction(s): Unknown    History of Present Illness    Gregory Hall is a 64 y.o. male with PMH as above.  He has history of CAD s/p PCI to the LAD in 2010, HFpEF, atrial flutter s/p DCCV 09/26/2019, PAD with prior stenting to the left subclavian artery, CKD stage III, IDDM, hypertension, hyperlipidemia, psychiatric history, chronic hypoxic respiratory failure on supplemental home oxygen via nasal cannula at 3 L secondary to COPD secondary to tobacco use, esophageal spasms, as course of gastritis, GERD, anxiety/depression, and chronic back pain s/p multiple surgeries.   He has known lower blood pressure in the left arm with carotid Doppler 08/2014 showing mild nonhemodynamically significant bilateral carotid artery disease with no evidence of subclavian artery stenosis.  Echo 2016 for murmur showed normal LVSF with no significant valvular abnormalities.  He was evaluated 07/20/2015 for preprocedure risk stratification prior to colonoscopy.  He underwent MPI 07/2015 without ischemia and ruled low risk scan.  He was admitted to [facility]  10/2016 for chest pain and unable to complete outpatient dobutamine stress test.   He underwent LHC 10/2016 that showed left main 30% stenosis, patent proximal LAD stent with minor luminal irregularities involving the LAD, left circumflex, and RCA.  LVEF 60%, mildly elevated LVEDP of 17 mmHg.  He was then lost to follow-up.  He was seen in the ED March and May 2021 for falls.  He was admitted to [facility] 06/2019 for COPD exacerbation. He was admitted again with acute on chronic  respiratory failure with hypoxia and hypercapnia in the setting of AE of COPD and acute on chronic HFpEF.  He was treated for COPD flare and IV diuresed.  09/14/2019 echo showed EF 65 to 70%, no RWMA, G2DD.  He was admitted to [facility] 9/12-9/19 for metabolic encephalopathy secondary to acute on chronic hypoxic and hypercapnic respiratory failure requiring mechanical ventilation secondary to AE of COPD and acute on chronic HFpEF, hypotension requiring vasopressor support, ARF requiring temporary dialysis, and new onset atrial flutter with RVR s/p DCCV.  Limited echo 09/26/2019 showed EF 55 to 60%.  High-sensitivity troponin minimally elevated with peak 78 and felt secondary to supply demand ischemia.  BP soft during admission and placed on midodrine.  Discharge weight 95.6 kg.  He was seen at follow-up 9/27 and doing well from a cardiac perspective.  He noted some lower extremity edema, though improved when compared during admission.  He indicated this was a longstanding issue for him and at times having to titrate his Lasix up to 80 mg for a brief period of time.  He reported not salting his food unless eating [place] fries, approximately once every 2 weeks.  He developed a resting tremor 9/24, which was reportedly new for him.  Due to recent AKI, Lasix was not uptitrated and recommendation was for continued leg elevation and compression stockings.  He was seen at his PCP 10/08/2019 and was instructed to go to the emergency department due to bilateral lower extremity edema. He was subsequently admitted 9/29-10/1/21 for [facility] HFpEF.  He reported chronic 6 pillow orthopnea due to back pain and shortness of breath.  He was chronically on 3 L nasal cannula at home.  He was living at assisted living and reported compliance with medications.  He was reportedly mostly wheelchair bound.  He was admitted and IV diuresed and sent home on daily Lasix 20mg .  It was felt that he may have possible right lower extremity cellulitis,  given rubor and warmth.  He was started on Keflex to treat this possible cellulitis.  Of note, cardiology was not consulted during this time. Discharge wt 90.8kg.   When seen 10/17/2019, he reported he sometimes heard his heart beat in his head. He HAD significant LEE with right lower extremity still suspicious for cellulitis. He had ongoing malaise and weakness, as well as 6 pillow orthopnea. Most nights, he slept in his recliner chair.  Recommendations were to hold Bisoprolol. ASA was discontinued, as he was on Xarelto. He was continued on Lasix 20mg  daily to be used as long as SBP remained above 100 at facility when BP checked in the R arm (not the LUE). Up-titration of lasix was TBD when facility was to call 10/1 with BP/HR readings and based on Cr/K. Additional abx for RLE cellulitis per PCP with RN at [facility] to contact pt PCP.   On 10/22/2019, he was admitted to [facility] for COPD exacerbation.  Per review of EMR, his group home noticed he was progressively confused throughout the  day.  He seemed to worsen after receiving his evening medications, including narcotics for chronic pain.  At arrival, EMS gave him a dose of Narcan without significant response.  In the ED, he was noted to be quite somnolent but arousable to voice.  He complained of pain in his legs.  He was noted to be wheezing.  Initial labs showed potassium 5.3 bicarb 33 glucose 125, BUN 10, creatinine 1.21, high-sensitivity troponin9, WBC 7.9, hemoglobin 11.5.  He required intubation briefly for support. He reportedly requested narcotics at that time. He was also provided with nebulizer and Solu-Medrol.  Chest x-ray showed chronic lung disease.  Urinalysis negative for UTI.  Urine drug screen negative.  Ethyl alcohol less than 10.  At discharge, he was continued on amiodarone and Xarelto.  He reported that he did not feel Lasix 20 mg daily was effective for him and requested increase to 40 mg daily at discharge.  He was discharged on abx,   Prednisone, and inhalers.  Today, 11/03/2019, he returns to clinic and notes that he is doing about the same as his previous clinic visit.  He does feel as if his lower extremity edema has improved.  On review of his edema, ongoing concern regarding cellulitis was noted with patient stating that he has not received any additional antibiotics for this and recommendation to reach out to his PCP.  He states that he has not received amiodarone since discharge, and given that he is also on prednisone at this time, restart of amiodarone was encouraged with this written on his paperwork to provide for his facility.  As he was on increased oral diuresis at discharge with Reds vest reading today 26, repeat BMET encouraged with patient agreement.  He continues smoking at 1/2 pack daily with recommendation to discontinue.  He is feels as if his breathing has worsened, despite his most recent admission, with encouragement to continue to try to quit smoking.  He also reports weakness and some nausea.  He does report that he is sleeping in his bed now on only one pillow and no longer in his recliner.  No chest pain, racing heart rate, or palpitations.  No presyncope or syncope.  No PND, noted orthopnea, or early satiety.  We discussed recommendations for pulmonology, PCP, and neurology.  Reds vest reading today as above 26.  BP taken in the right arm and 110/52 with heart rate 78 bpm and oxygen saturation 91%.  He denies any signs or symptoms of bleeding.  Home Medications    Prior to Admission medications   Medication Sig Start Date End Date Taking? Authorizing Provider  amiodarone (PACERONE) 200 MG tablet Take 200 mg by mouth daily. Patient not taking: Reported on 10/08/2019    [provider]  aspirin EC 81 MG tablet Take 81 mg by mouth daily.    [provider]  atorvastatin (LIPITOR) 40 MG tablet Take 1 tablet (40 mg total) by mouth daily. 09/28/19   Lewie Chamber, MD  bisoprolol (ZEBETA) 5 MG  tablet Take 0.5 tablets (2.5 mg total) by mouth daily. 09/29/19   Lewie Chamber, MD  Cholecalciferol (VITAMIN D3) 50 MCG (2000 UT) TABS Take 2,000 Units by mouth daily.     [provider]  cyclobenzaprine (FLEXERIL) 5 MG tablet Take 1 tablet (5 mg total) by mouth 3 (three) times daily as needed. Patient taking differently: Take 5 mg by mouth 2 (two) times daily as needed for muscle spasms.  05/25/19   Menshew, Smith Robert  Tomasa Blase, PA-C  DULoxetine (CYMBALTA) 60 MG capsule Take 60 mg by mouth daily.    [provider]  Ensure Max Protein (ENSURE MAX PROTEIN) LIQD Take 330 mLs (11 oz total) by mouth 2 (two) times daily between meals. 06/14/19   Marguerita Merles Latif, DO  furosemide (LASIX) 20 MG tablet Take 1 tablet (20 mg total) by mouth daily. Patient taking differently: Take 20 mg by mouth daily as needed for fluid.  09/14/19   Danford, Earl Lites, MD  insulin glargine (LANTUS) 100 UNIT/ML injection Inject 22 Units into the skin at bedtime.     [provider]  insulin lispro (HUMALOG) 100 UNIT/ML injection Inject 4 Units into the skin 3 (three) times daily before meals. Hold for FSBG <100    [provider]  ipratropium-albuterol (DUONEB) 0.5-2.5 (3) MG/3ML SOLN Take 3 mLs by nebulization every 6 (six) hours as needed. Patient taking differently: Take 3 mLs by nebulization every 6 (six) hours as needed (wheezing and shortness of breath).  06/14/19   Marguerita Merles Latif, DO  lactulose (CHRONULAC) 10 GM/15ML solution Take 20 g by mouth daily as needed for mild constipation.    [provider]  lamoTRIgine (LAMICTAL) 100 MG tablet Take 100 mg by mouth daily.    [provider]  Menthol-Methyl Salicylate (SALONPAS PAIN RELIEF PATCH EX) Place 1 patch onto the skin every 12 (twelve) hours as needed (aches and pains).    [provider]  metFORMIN (GLUCOPHAGE-XR) 750 MG 24 hr tablet Take 1 tablet (750 mg total) by mouth 2 (two) times daily. 09/16/19    Danford, Earl Lites, MD  midodrine (PROAMATINE) 10 MG tablet Take 1 tablet (10 mg total) by mouth every 8 (eight) hours. 09/28/19   Lewie Chamber, MD  NAC 600 MG CAPS Take 600 mg by mouth daily. 09/08/19   [provider]  nicotine (NICODERM CQ - DOSED IN MG/24 HOURS) 21 mg/24hr patch Place onto the skin.    [provider]  NITROSTAT 0.4 MG SL tablet DISSOLVE (1) TABLET UNDER TONGUE AS NEEDED TO RELIEVE CHEST PAIN. MAYREPEAT EVERY 5 MINUTES. Patient taking differently: Place 0.4 mg under the tongue every 5 (five) minutes as needed for chest pain.  10/06/15   Iran Ouch, MD  Oxycodone HCl 10 MG TABS Take 1 tablet (10 mg total) by mouth 3 (three) times daily. 09/14/19   Danford, Earl Lites, MD  oxymetazoline (AFRIN) 0.05 % nasal spray Place 2 sprays into both nostrils 2 (two) times daily as needed for congestion.    [provider]  pantoprazole (PROTONIX) 40 MG tablet Take 40 mg by mouth daily.     [provider]  predniSONE (DELTASONE) 5 MG tablet Take 2.5 mg by mouth daily with breakfast.    [provider]  pregabalin (LYRICA) 200 MG capsule Take 1 capsule (200 mg total) by mouth 3 (three) times daily. 09/14/19   Danford, Earl Lites, MD  rivaroxaban (XARELTO) 20 MG TABS tablet Take 20 mg by mouth daily with supper.    [provider]  senna-docusate (SENOKOT-S) 8.6-50 MG tablet Take 1 tablet by mouth daily.     [provider]  tamsulosin (FLOMAX) 0.4 MG CAPS capsule Take 1 capsule (0.4 mg total) by mouth daily after supper. 10/10/19   Lynn Ito, MD  tiotropium (SPIRIVA) 18 MCG inhalation capsule Place into inhaler and inhale.    [provider]  traZODone (DESYREL) 100 MG tablet Take 100 mg by mouth at bedtime.  09/08/19   [provider]    Review of Systems    He denies chest pain, pnd, v, dizziness, palpitations, syncope, weight gain, or early satiety.  He reports worsening breathing but improved  lower extremity edema and orthopnea.  He notes malaise and weakness. He notes ongoing erythema of LE. He reports some nausea. All other systems reviewed and are otherwise negative except as noted above.  Physical Exam    VS:  BP (!) 110/52 (BP Location: Right Arm, Patient Position: Sitting, Cuff Size: Normal)   Pulse 78   Ht 6' (1.829 m)   Wt 194 lb (88 kg)   SpO2 91% Comment: 2 Liters of Oxygen  BMI 26.31 kg/m  , BMI Body mass index is 26.31 kg/m. GEN: Male seated in wheelchair next to exam bed. HEENT: normal.  Nasal cannula oxygen, attached to machine, in place. Neck: Supple, no JVD. + bilateral carotid carotid bruits. No masses. Cardiac: RRR, no murmurs, rubs, or gallops. No clubbing, cyanosis, moderate nonpitting bilateral lower extremity edema and skin changes still concerning for LE cellulitis still present.  Radials/DP/PT 2+ and equal bilaterally.  Respiratory:  Respirations regular and unlabored,  expiratory wheeze, coarse breath sounds bilaterally. [place] oxygen and machine in place.  GI: Soft, nontender, nondistended, BS + x 4. MS: no deformity or atrophy.  As above, bilateral RLE still concerning for cellulitis.  In addition, right upper extremity (one area of forearm) concerning for cellulitis as well.  Bilateral upper and lower extremity ecchymosis noted. Skin: warm and dry, no rash. Neuro:  Strength and sensation are intact. Tremor noted.  Psych: Normal affect.  Accessory Clinical Findings    ECG personally reviewed by me today -NSR, 68 bpm, left axis deviation, PR interval 188 ms, QRS 86 ms, QTC 429- no acute changes.  VITALS Reviewed today   Temp Readings from Last 3 Encounters:  10/27/19 99.4 F (37.4 C) (Oral)  10/10/19 98.5 F (36.9 C) (Oral)  09/28/19 98.8 F (37.1 C) (Oral)   BP Readings from Last 3 Encounters:  11/03/19 (!) 110/52  10/27/19 131/66  10/21/19 (!) 97/54  NOTE: Repeat BP 10/17/19 at facility in R arm with SBP 100-110s Pulse Readings from Last 3  Encounters:  11/03/19 78  10/27/19 77  10/21/19 68    Wt Readings from Last 3 Encounters:  11/03/19 194 lb (88 kg)  10/27/19 198 lb 6.6 oz (90 kg)  10/21/19 202 lb 3 oz (91.7 kg)     LABS  reviewed today   Lab Results  Component Value Date   WBC 7.8 10/27/2019   HGB 10.3 (L) 10/27/2019   HCT 32.7 (L) 10/27/2019   MCV 84.1 10/27/2019   PLT 246 10/27/2019   Lab Results  Component Value Date   CREATININE 1.16 10/27/2019   BUN 31 (H) 10/27/2019   NA 136 10/27/2019   K 4.4 10/27/2019   CL 89 (L) 10/27/2019   CO2 38 (H) 10/27/2019   Lab Results  Component Value Date   ALT POSSIBLE CONTAMINATION 10/23/2019   AST POSSIBLE CONTAMINATION 10/23/2019   ALKPHOS POSSIBLE CONTAMINATION 10/23/2019   BILITOT 0.7 10/22/2019   Lab Results  Component Value Date   CHOL 97 09/25/2019   HDL 28 (L) 09/25/2019   LDLCALC 46 09/25/2019   TRIG 67 10/25/2019   CHOLHDL 3.5 09/25/2019    Lab Results  Component Value Date   HGBA1C 6.5 (H) 10/23/2019   Lab Results  Component Value Date   TSH 1.940 10/17/2019  STUDIES/PROCEDURES reviewed today   Limited 2D echo 09/26/2019: 1. Left ventricular ejection fraction, by estimation, is 55 to 60%. The  left ventricle has normal function. The left ventricle has no regional  wall motion abnormalities. Indeterminate diastolic filling due to E-A  fusion.  2. Right ventricular systolic function is normal. The right ventricular  size is normal. There is normal pulmonary artery systolic pressure.  3. The mitral valve was not well visualized. No evidence of mitral valve  regurgitation. No evidence of mitral stenosis.  4. The aortic valve was not well visualized. Aortic valve regurgitation  is not visualized. Mild aortic valve sclerosis is present, with no  evidence of aortic valve stenosis.  5. Normal sinus rhythm. _________  2D echo 09/14/2019: 1. Left ventricular ejection fraction, by estimation, is 65 to 70%. The  left ventricle has  normal function. The left ventricle has no regional  wall motion abnormalities. Left ventricular diastolic parameters are  consistent with Grade II diastolic  dysfunction (pseudonormalization).  2. Right ventricular systolic function is normal. The right ventricular  size is normal. There is normal pulmonary artery systolic pressure.  3. The mitral valve is normal in structure. No evidence of mitral valve  regurgitation.  4. The aortic valve was not well visualized. Aortic valve regurgitation  not assessed.  5. Pulmonic valve regurgitation not assessed.  6. The inferior vena cava is normal in size with greater than 50%  respiratory variability, suggesting right atrial pressure of 3 mmHg. __________  LHC 10/2016: Conclusions:   Nonobstructive coronary artery disease with wide open LAD stent. 30% in  the ostial left main   Normal left ventricle size and function with an ejection fraction of 60%   Mildly elevated left and end diastolic pressure at 17 mmHg   FINDINGS:   Dominance: Right   Left Main: 30% ostial left main.   LAD: LAD proximal stent is wide open. Minor luminal irregularities with  no crackles stenosis   CIRC: Minor luminal irregularities and no critical stenosis   RCA: Minor luminal irregularities with no crackles stenosis   Left Ventriculogram:  Normal left medical size and function with an  ejection fraction of 60%   Hemodynamics: Mildly elevated left and end diastolic pressure at 17 mmHg  __________  Eugenie Birks MPI 07/2015: Pharmacological myocardial perfusion imaging study with no significant ischemia Normal wall motion, EF estimated at 92% No EKG changes concerning for ischemia at peak stress or in recovery. Baseline EKG with diffuse T wave abnormality Low risk scan __________  2D echo 08/2014: - Left ventricle: The cavity size was normal. Wall thickness was  normal. Systolic function was normal. The estimated ejection  fraction was  in the range of 60% to 65%. Wall motion was normal;  there were no regional wall motion abnormalities. Left  ventricular diastolic function parameters were normal.   Impressions:   - Normal study.   Assessment & Plan    Chronic HFpEF --Reports improved volume status s/p recent admission for acute on chronic COPD.  At discharge, per patient request, Lasix 20 mg increased to Lasix 40 mg.  We will recheck a BMET.  Continue current Lasix 40 mg daily until that time, BMET resulted.  Current weight today 194 pounds or 88 kg with discharge weight 90 kg.  Reds vest today 26%.  Relatively euvolemic and well compensated on exam, though persistent changes of his lower extremity consistent with cellulitis noted and to be discussed with PCP per patient.  As previously noted, echo  has shown low RVSP.  Continue Lasix 40 mg with bisoprolol 2.5 mg daily.  Continue leg elevation and compression stockings.  Discussed low-salt diet and fluid restriction under 2 L.  Facility instructed by [facility] RN to monitor HR/BP (BP only in R arm).  RLE cellulitis --As above, RLE suspicious for ongoing cellulitis.  Will defer to PCP for further antibiotic treatment, if indicated.  Bilateral bruit on exam --Bilateral bruit appreciated on exam.  It is unclear if the bruit appreciated on his left carotid is radiation from that of his subclavian stenosis.  Nevertheless, update bilateral carotid studies as above.  On review of EMR, previous 2016 studies with mild disease noted.  No report of amaurosis fugax.  Ongoing control of LDL and lifestyle factors.  Atrial flutter with RVR s/p DCCV 9/17 --Maintaining NSR with rates controlled s/p DCCV 9/17.  Developed atrial flutter 9/13. Recurrent atrial flutter 9/16 without response to IV digoxin and IV amiodarone; therefore, underwent DCCV 9/17. --Continue current amiodarone  daily.  Renewal of amiodarone 200 mg daily provided today, as it appears he is not currently taking this  medication. --Continue current bisoprolol 2.5 mg daily.   Current rate well controlled. --Continue Xarelto for OAC. CHA2DS2VASc score of at least 5 (CHF, HTN, agex1, DM2 vascular). No s/sx of bleeding. He does have anemia of chronic dz.   CAD without angina --No CP or sx concerning for angina. S/p PCI to LAD 2010.  Continue Lipitor.  Continue Xarelto in lieu of ASA.  Continue beta-blocker.  No further ischemic work-up at this time.  COPD --Continue current breathing treatments/ inhalers.   He is currently on a prednisone course with recommendation that he continue amiodarone 200 mg daily as above.  HTN --BP taken in right arm, as left arm is continuously lower than that of right 2/2 left subclavian artery stenosis.  BP today well controlled.  No signs or symptoms of presyncope or syncope.. Continue current beta-blocker and midodrine  q8h. Of note, he reports asx if SBP 90s.  HLD --LDL 46 from 09/2019 with LFTs wnl. Continue current atorvastatin.  L subclavian artery stenosis --As above, BP lower in the L arm and that of right arm with recommendation for R arm pressures at each clinic visit. His facility was called at his last visit to inform that R arm pressures should be taken when deciding if to administer medications. He reports he is asx if SBP 90s.  Periodic imaging recommended, as well as cholesterol control and ongoing Xarelto in lieu of ASA.  Anemia --Likely of chronic dz. Continue Xarelto for anticoagulation.  No reported signs or symptoms of bleeding.  Tobacco use / COPD --Smoking cessation advised.   He reports he is trying to cut back and down to 1/2 pack daily.  Ongoing work towards cessation encouraged.  Follow-up with PCP as directed.   Tremor --Amiodarone reportedly stopped in the past to see if washout improved tremor.  Per patient, this was unsuccessful.  At discharge, it was recommended he continue amiodarone; however, he is currently not on amiodarone.  Restart  amiodarone 200 mg daily at this time.  Case discussed with another provider in the office with recommendation for referral to neurology for ongoing tremor.  He is agreeable to neurology referral today.   Medication changes: No changes. Restart amiodarone  daily as in discharge summary. Further recommendations, if needed, pending repeat BMET. Decision re: abx for cellulitis TBD by PCP.  Labs ordered: BMET Studies / Imaging ordered:  neurology referral,  b/l carotids Future considerations: Repeat ReDS vest at RTC (26% 11/03/19) Disposition: RTC 1 month   Lennon Alstrom, PA-C 11/03/2019

## 2019-11-04 LAB — COMPREHENSIVE METABOLIC PANEL
ALT: 18 IU/L (ref 0–44)
AST: 21 IU/L (ref 0–40)
Albumin/Globulin Ratio: 1.8 (ref 1.2–2.2)
Albumin: 4.6 g/dL (ref 3.8–4.8)
Alkaline Phosphatase: 74 IU/L (ref 44–121)
BUN/Creatinine Ratio: 20 (ref 10–24)
BUN: 17 mg/dL (ref 8–27)
Bilirubin Total: 0.4 mg/dL (ref 0.0–1.2)
CO2: 23 mmol/L (ref 20–29)
Calcium: 9.9 mg/dL (ref 8.6–10.2)
Chloride: 101 mmol/L (ref 96–106)
Creatinine, Ser: 0.87 mg/dL (ref 0.76–1.27)
GFR calc Af Amer: 104 mL/min/{1.73_m2} (ref >59)
GFR calc non Af Amer: 90 mL/min/{1.73_m2} (ref >59)
Globulin, Total: 2.6 g/dL (ref 1.5–4.5)
Glucose: 265 mg/dL — ABNORMAL HIGH (ref 65–99)
Potassium: 4.1 mmol/L (ref 3.5–5.2)
Sodium: 141 mmol/L (ref 134–144)
Total Protein: 7.2 g/dL (ref 6.0–8.5)

## 2019-11-04 LAB — CBC WITH DIFFERENTIAL/PLATELET
Basophils Absolute: 0 10*3/uL (ref 0.0–0.2)
Basos: 0 %
EOS (ABSOLUTE): 0.2 10*3/uL (ref 0.0–0.4)
Eos: 3 %
Hematocrit: 35.8 % — ABNORMAL LOW (ref 37.5–51.0)
Hemoglobin: 11 g/dL — ABNORMAL LOW (ref 13.0–17.7)
Immature Grans (Abs): 0 10*3/uL (ref 0.0–0.1)
Immature Granulocytes: 0 %
Lymphocytes Absolute: 1.5 10*3/uL (ref 0.7–3.1)
Lymphs: 15 %
MCH: 26.4 pg — ABNORMAL LOW (ref 26.6–33.0)
MCHC: 30.7 g/dL — ABNORMAL LOW (ref 31.5–35.7)
MCV: 86 fL (ref 79–97)
Monocytes Absolute: 1.3 10*3/uL — ABNORMAL HIGH (ref 0.1–0.9)
Monocytes: 13 %
Neutrophils Absolute: 6.4 10*3/uL (ref 1.4–7.0)
Neutrophils: 69 %
Platelets: 353 10*3/uL (ref 150–450)
RBC: 4.16 x10E6/uL (ref 4.14–5.80)
RDW: 16.5 % — ABNORMAL HIGH (ref 11.6–15.4)
WBC: 9.4 10*3/uL (ref 3.4–10.8)

## 2019-11-06 ENCOUNTER — Other Ambulatory Visit: Payer: Self-pay | Admitting: Internal Medicine

## 2019-11-06 ENCOUNTER — Other Ambulatory Visit (HOSPITAL_COMMUNITY): Payer: Self-pay | Admitting: Internal Medicine

## 2019-11-06 DIAGNOSIS — Z136 Encounter for screening for cardiovascular disorders: Secondary | ICD-10-CM

## 2019-11-11 ENCOUNTER — Other Ambulatory Visit: Payer: Self-pay | Admitting: Internal Medicine

## 2019-11-11 DIAGNOSIS — Z87891 Personal history of nicotine dependence: Secondary | ICD-10-CM

## 2019-11-11 DIAGNOSIS — Z136 Encounter for screening for cardiovascular disorders: Secondary | ICD-10-CM

## 2019-11-12 ENCOUNTER — Ambulatory Visit
Admission: RE | Admit: 2019-11-12 | Discharge: 2019-11-12 | Disposition: A | Discharge status: Home / Self Care | Payer: Medicare Other | Source: Ambulatory Visit | Attending: Internal Medicine | Admitting: Internal Medicine

## 2019-11-12 ENCOUNTER — Other Ambulatory Visit: Payer: Self-pay

## 2019-11-12 DIAGNOSIS — Z136 Encounter for screening for cardiovascular disorders: Secondary | ICD-10-CM

## 2019-11-12 DIAGNOSIS — Z87891 Personal history of nicotine dependence: Secondary | ICD-10-CM | POA: Diagnosis present

## 2019-11-14 ENCOUNTER — Telehealth: Payer: Self-pay | Admitting: Physician Assistant

## 2019-11-14 NOTE — Telephone Encounter (Signed)
Late Entry:  Referral/ demographics/ office note faxed to [facility]. Dx- tremors  Faxed to [phone]. Confirmation received.

## 2019-11-24 ENCOUNTER — Encounter: Payer: Self-pay | Admitting: Urology

## 2019-11-24 ENCOUNTER — Ambulatory Visit: Payer: Self-pay | Admitting: Urology

## 2019-12-03 ENCOUNTER — Ambulatory Visit: Payer: Medicare Other | Admitting: Family

## 2019-12-08 NOTE — Progress Notes (Deleted)
Office Visit    Patient Name: Gregory Hall Date of Encounter: 12/08/2019  Primary Care Provider:  Enid Baas, MD Primary Cardiologist:  Lorine Bears, MD  Chief Complaint    No chief complaint on file.   64 year old male with history of CAD s/p PCI to the LAD in 2010, HFpEF, atrial flutter s/p DCCV 09/26/2019, PAD with prior stenting to the left subclavian artery, CKD stage III, IDDM, hypertension, hyperlipidemia, psychiatric history, chronic hypoxic respiratory failure on supplemental home oxygen secondary to COPD secondary to tobacco use, esophageal spasm, erosive gastritis, GERD, anxiety/depression, chronic back pain, and who presents today for follow-up after recent repeat admission for acute on chronic HFpEF.  Past Medical History    Past Medical History:  Diagnosis Date  . AKI (acute kidney injury) (HCC) 09/12/2019  . CAD (coronary artery disease)    s/p PTCA and stent x2  . Chest pain   . CHF (congestive heart failure) (HCC)   . Chronic pain syndrome   . COPD (chronic obstructive pulmonary disease) (HCC)   . Degenerative cervical disc   . Depression   . Diabetes mellitus without complication (HCC)   . Dyslipidemia   . GERD (gastroesophageal reflux disease)   . Hernia 2014  . Hypertension   . MRSA (methicillin resistant staph aureus) culture positive 2011  . Neuropathy   . Nutcracker esophagus   . Rectus diastasis 07/19/2012   Past Surgical History:  Procedure Laterality Date  . BACK SURGERY  2012  . CARDIOVERSION N/A 09/26/2019   Procedure: CARDIOVERSION;  Surgeon: Antonieta Iba, MD;  Location: [facility] ORS;  Service: Cardiovascular;  Laterality: N/A;  . CHOLECYSTECTOMY    . COLONOSCOPY  Jan 2014   [name]  . CORONARY ANGIOPLASTY WITH STENT PLACEMENT  2009   stents x2, in [place], [place]  . FOOT SURGERY     Right  . NECK SURGERY    . SPINE SURGERY  2012,2013  . TONSILLECTOMY      Allergies  Allergies  Allergen Reactions  . Acetaminophen  Other (See Comments)    Reaction:  Unknown  Kidney failure Told not to take from home M.D. Related to kidney and renal failure   . Fentanyl   . Nsaids Other (See Comments)    Reaction:  Unknown  Kidney failure Patient states not to take from home M.D. Related to kidney and renal failure.  Other reaction(s): Unknown    History of Present Illness    Gregory Hall is a 64 y.o. male with PMH as above.  He has history of CAD s/p PCI to the LAD in 2010, HFpEF, atrial flutter s/p DCCV 09/26/2019, PAD with prior stenting to the left subclavian artery, CKD stage III, IDDM, hypertension, hyperlipidemia, psychiatric history, chronic hypoxic respiratory failure on supplemental home oxygen via nasal cannula at 3 L secondary to COPD secondary to tobacco use, esophageal spasms, as course of gastritis, GERD, anxiety/depression, and chronic back pain s/p multiple surgeries. He has known lower blood pressure in the left arm with carotid Doppler 08/2014 showing mild nonhemodynamically significant bilateral carotid artery disease with no evidence of subclavian artery stenosis.    He has known CAD  s/p PCI to the LAD in 2010.  MPI 07/2015 was without ischemia and ruled low risk scan. LHC 10/2016 showed left main 30% stenosis, patent proximal LAD stent with minor luminal irregularities involving the LAD, left circumflex, and RCA.  LVEF 60%, mildly elevated LVEDP of 17 mmHg.  He was seen in the  ED March and May 2021 for falls, as well as several subsequent admissions for AECOPD and AOC HFpEF.  He was treated for COPD flare and IV diuresed. 09/14/2019 echo showed EF 65 to 70%, no RWMA, G2DD.  He was admitted to [facility]/12[facility]9/19 for [facility] HFpEF / COPD and required vasopressor support and temporary dialysis. He had new onset atrial flutter with RVR s/p DCCV.  He was placed on midodrine.  Limited echo 09/26/2019 showed EF 55 to 60%. He was readmitted 9/29-10/1/21 for recurrent exacerbation and IV diuresed, as well as  started on Keflex to treat possible RLE cellulitis (still seen in clinic 10/17/19 with message sent to PCP). He was readmitted 10/2019 for AECOPD after progressively confused throughout the day at his group home, which was reportedly worse after evening medications, which including narcotics for chronic pain. No response to Narcan. He required intubation briefly for support.  He received nebulizer support and Solu-Medrol.  Chest x-ray showed chronic lung disease.   He was seen in clinic 11/03/2019 and reported improved orthopnea from 6 pillows to 1 pillow (and no longer in a recliner), as well as improved LEE. He noted ongoing progressive breathing issues, nausea, and weakness.  RLE still concerning for cellulitis with message sent to PCP.  He was instructed to restart his amiodarone, discontinued at his facility.  ReDS vest 26.  He was smoking 1/2 pack daily.  Referrals were recommended to pulmonology, PCP, and neurology. R arm BP 110/52 with heart rate 78 bpm and oxygen saturation 91%.    ------------------  Home Medications    Prior to Admission medications   Medication Sig Start Date End Date Taking? Authorizing Provider  amiodarone (PACERONE) 200 MG tablet Take 200 mg by mouth daily. Patient not taking: Reported on 10/08/2019    [provider]  aspirin EC 81 MG tablet Take 81 mg by mouth daily.    [provider]  atorvastatin (LIPITOR) 40 MG tablet Take 1 tablet (40 mg total) by mouth daily. 09/28/19   Lewie Chamber, MD  bisoprolol (ZEBETA) 5 MG tablet Take 0.5 tablets (2.5 mg total) by mouth daily. 09/29/19   Lewie Chamber, MD  Cholecalciferol (VITAMIN D3) 50 MCG (2000 UT) TABS Take 2,000 Units by mouth daily.     [provider]  cyclobenzaprine (FLEXERIL) 5 MG tablet Take 1 tablet (5 mg total) by mouth 3 (three) times daily as needed. Patient taking differently: Take 5 mg by mouth 2 (two) times daily as needed for muscle spasms.  05/25/19   Menshew, Charlesetta Ivory,  PA-C  DULoxetine (CYMBALTA) 60 MG capsule Take 60 mg by mouth daily.    [provider]  Ensure Max Protein (ENSURE MAX PROTEIN) LIQD Take 330 mLs (11 oz total) by mouth 2 (two) times daily between meals. 06/14/19   Marguerita Merles Latif, DO  furosemide (LASIX) 20 MG tablet Take 1 tablet (20 mg total) by mouth daily. Patient taking differently: Take 20 mg by mouth daily as needed for fluid.  09/14/19   Danford, Earl Lites, MD  insulin glargine (LANTUS) 100 UNIT/ML injection Inject 22 Units into the skin at bedtime.     [provider]  insulin lispro (HUMALOG) 100 UNIT/ML injection Inject 4 Units into the skin 3 (three) times daily before meals. Hold for FSBG <100    [provider]  ipratropium-albuterol (DUONEB) 0.5-2.5 (3) MG/3ML SOLN Take 3 mLs by nebulization every 6 (six) hours as needed. Patient taking differently: Take 3 mLs by nebulization every  6 (six) hours as needed (wheezing and shortness of breath).  06/14/19   Marguerita Merles Latif, DO  lactulose (CHRONULAC) 10 GM/15ML solution Take 20 g by mouth daily as needed for mild constipation.    [provider]  lamoTRIgine (LAMICTAL) 100 MG tablet Take 100 mg by mouth daily.    [provider]  Menthol-Methyl Salicylate (SALONPAS PAIN RELIEF PATCH EX) Place 1 patch onto the skin every 12 (twelve) hours as needed (aches and pains).    [provider]  metFORMIN (GLUCOPHAGE-XR) 750 MG 24 hr tablet Take 1 tablet (750 mg total) by mouth 2 (two) times daily. 09/16/19   Danford, Earl Lites, MD  midodrine (PROAMATINE) 10 MG tablet Take 1 tablet (10 mg total) by mouth every 8 (eight) hours. 09/28/19   Lewie Chamber, MD  NAC 600 MG CAPS Take 600 mg by mouth daily. 09/08/19   [provider]  nicotine (NICODERM CQ - DOSED IN MG/24 HOURS) 21 mg/24hr patch Place onto the skin.    [provider]  NITROSTAT 0.4 MG SL tablet DISSOLVE (1) TABLET UNDER TONGUE AS NEEDED TO RELIEVE CHEST PAIN.  MAYREPEAT EVERY 5 MINUTES. Patient taking differently: Place 0.4 mg under the tongue every 5 (five) minutes as needed for chest pain.  10/06/15   Iran Ouch, MD  Oxycodone HCl 10 MG TABS Take 1 tablet (10 mg total) by mouth 3 (three) times daily. 09/14/19   Danford, Earl Lites, MD  oxymetazoline (AFRIN) 0.05 % nasal spray Place 2 sprays into both nostrils 2 (two) times daily as needed for congestion.    [provider]  pantoprazole (PROTONIX) 40 MG tablet Take 40 mg by mouth daily.     [provider]  predniSONE (DELTASONE) 5 MG tablet Take 2.5 mg by mouth daily with breakfast.    [provider]  pregabalin (LYRICA) 200 MG capsule Take 1 capsule (200 mg total) by mouth 3 (three) times daily. 09/14/19   Danford, Earl Lites, MD  rivaroxaban (XARELTO) 20 MG TABS tablet Take 20 mg by mouth daily with supper.    [provider]  senna-docusate (SENOKOT-S) 8.6-50 MG tablet Take 1 tablet by mouth daily.     [provider]  tamsulosin (FLOMAX) 0.4 MG CAPS capsule Take 1 capsule (0.4 mg total) by mouth daily after supper. 10/10/19   Lynn Ito, MD  tiotropium (SPIRIVA) 18 MCG inhalation capsule Place into inhaler and inhale.    [provider]  traZODone (DESYREL) 100 MG tablet Take 100 mg by mouth at bedtime. 09/08/19   [provider]    Review of Systems    He denies chest pain, pnd, v, dizziness, palpitations, syncope, weight gain, or early satiety.  He reports worsening breathing but improved lower extremity edema and orthopnea.  He notes malaise and weakness. He notes ongoing erythema of LE. He reports some nausea. All other systems reviewed and are otherwise negative except as noted above.  Physical Exam    VS:  There were no vitals taken for this visit. , BMI There is no height or weight on file to calculate BMI. GEN: Male seated in wheelchair next to exam bed. HEENT: normal.  Nasal cannula oxygen, attached to machine, in  place. Neck: Supple, no JVD. + bilateral carotid carotid bruits. No masses. Cardiac: RRR, no murmurs, rubs, or gallops. No clubbing, cyanosis, moderate nonpitting bilateral lower extremity edema and skin changes still concerning for LE cellulitis still present.  Radials/DP/PT 2+ and equal bilaterally.  Respiratory:  Respirations regular and unlabored,  expiratory wheeze, coarse breath sounds bilaterally. [place] oxygen and machine in place.  GI: Soft, nontender, nondistended, BS + x 4. MS: no deformity or atrophy.  As above, bilateral RLE still concerning for cellulitis.  In addition, right upper extremity (one area of forearm) concerning for cellulitis as well.  Bilateral upper and lower extremity ecchymosis noted. Skin: warm and dry, no rash. Neuro:  Strength and sensation are intact. Tremor noted.  Psych: Normal affect.  Accessory Clinical Findings    ECG personally reviewed by me today -NSR, 68 bpm, left axis deviation, PR interval 188 ms, QRS 86 ms, QTC 429- no acute changes.  VITALS Reviewed today   Temp Readings from Last 3 Encounters:  10/27/19 99.4 F (37.4 C) (Oral)  10/10/19 98.5 F (36.9 C) (Oral)  09/28/19 98.8 F (37.1 C) (Oral)   BP Readings from Last 3 Encounters:  11/03/19 (!) 110/52  10/27/19 131/66  10/21/19 (!) 97/54  NOTE: Repeat BP 10/17/19 at facility in R arm with SBP 100-110s Pulse Readings from Last 3 Encounters:  11/03/19 78  10/27/19 77  10/21/19 68    Wt Readings from Last 3 Encounters:  11/03/19 194 lb (88 kg)  10/27/19 198 lb 6.6 oz (90 kg)  10/21/19 202 lb 3 oz (91.7 kg)     LABS  reviewed today   Lab Results  Component Value Date   WBC 9.4 11/03/2019   HGB 11.0 (L) 11/03/2019   HCT 35.8 (L) 11/03/2019   MCV 86 11/03/2019   PLT 353 11/03/2019   Lab Results  Component Value Date   CREATININE 0.87 11/03/2019   BUN 17 11/03/2019   NA 141 11/03/2019   K 4.1 11/03/2019   CL 101 11/03/2019   CO2 23 11/03/2019   Lab Results  Component  Value Date   ALT 18 11/03/2019   AST 21 11/03/2019   ALKPHOS 74 11/03/2019   BILITOT 0.4 11/03/2019   Lab Results  Component Value Date   CHOL 97 09/25/2019   HDL 28 (L) 09/25/2019   LDLCALC 46 09/25/2019   TRIG 67 10/25/2019   CHOLHDL 3.5 09/25/2019    Lab Results  Component Value Date   HGBA1C 6.5 (H) 10/23/2019   Lab Results  Component Value Date   TSH 1.940 10/17/2019     STUDIES/PROCEDURES reviewed today   Limited 2D echo 09/26/2019: 1. Left ventricular ejection fraction, by estimation, is 55 to 60%. The  left ventricle has normal function. The left ventricle has no regional  wall motion abnormalities. Indeterminate diastolic filling due to E-A  fusion.  2. Right ventricular systolic function is normal. The right ventricular  size is normal. There is normal pulmonary artery systolic pressure.  3. The mitral valve was not well visualized. No evidence of mitral valve  regurgitation. No evidence of mitral stenosis.  4. The aortic valve was not well visualized. Aortic valve regurgitation  is not visualized. Mild aortic valve sclerosis is present, with no  evidence of aortic valve stenosis.  5. Normal sinus rhythm. _________  2D echo 09/14/2019: 1. Left ventricular ejection fraction, by estimation, is 65 to 70%. The  left ventricle has normal function. The left ventricle has no regional  wall motion abnormalities. Left ventricular diastolic parameters are  consistent with Grade II diastolic  dysfunction (pseudonormalization).  2. Right ventricular systolic function is normal. The right ventricular  size is normal. There is normal pulmonary artery systolic pressure.  3. The mitral valve is  normal in structure. No evidence of mitral valve  regurgitation.  4. The aortic valve was not well visualized. Aortic valve regurgitation  not assessed.  5. Pulmonic valve regurgitation not assessed.  6. The inferior vena cava is normal in size with greater than 50%   respiratory variability, suggesting right atrial pressure of 3 mmHg. __________  LHC 10/2016: Conclusions:   Nonobstructive coronary artery disease with wide open LAD stent. 30% in  the ostial left main   Normal left ventricle size and function with an ejection fraction of 60%   Mildly elevated left and end diastolic pressure at 17 mmHg   FINDINGS:   Dominance: Right   Left Main: 30% ostial left main.   LAD: LAD proximal stent is wide open. Minor luminal irregularities with  no crackles stenosis   CIRC: Minor luminal irregularities and no critical stenosis   RCA: Minor luminal irregularities with no crackles stenosis   Left Ventriculogram:  Normal left medical size and function with an  ejection fraction of 60%   Hemodynamics: Mildly elevated left and end diastolic pressure at 17 mmHg  __________  Eugenie Birks MPI 07/2015: Pharmacological myocardial perfusion imaging study with no significant ischemia Normal wall motion, EF estimated at 92% No EKG changes concerning for ischemia at peak stress or in recovery. Baseline EKG with diffuse T wave abnormality Low risk scan __________  2D echo 08/2014: - Left ventricle: The cavity size was normal. Wall thickness was  normal. Systolic function was normal. The estimated ejection  fraction was in the range of 60% to 65%. Wall motion was normal;  there were no regional wall motion abnormalities. Left  ventricular diastolic function parameters were normal.   Impressions:   - Normal study.   Assessment & Plan   *** Chronic HFpEF --Reports improved volume status s/p recent admission for acute on chronic COPD.  At discharge, per patient request, Lasix 20 mg increased to Lasix 40 mg.  We will recheck a BMET.  Continue current Lasix 40 mg daily until that time, BMET resulted.  Current weight today 194 pounds or 88 kg with discharge weight 90 kg.  Reds vest today 26%.  Relatively euvolemic and well compensated on exam,  though persistent changes of his lower extremity consistent with cellulitis noted and to be discussed with PCP per patient.  As previously noted, echo has shown low RVSP.  Continue Lasix 40 mg with bisoprolol 2.5 mg daily.  Continue leg elevation and compression stockings.  Discussed low-salt diet and fluid restriction under 2 L.  Facility instructed by [facility] RN to monitor HR/BP (BP only in R arm).  RLE cellulitis --As above, RLE suspicious for ongoing cellulitis.  Will defer to PCP for further antibiotic treatment, if indicated.  Bilateral bruit on exam --Bilateral bruit appreciated on exam.  It is unclear if the bruit appreciated on his left carotid is radiation from that of his subclavian stenosis.  Nevertheless, update bilateral carotid studies as above.  On review of EMR, previous 2016 studies with mild disease noted.  No report of amaurosis fugax.  Ongoing control of LDL and lifestyle factors.  Atrial flutter with RVR s/p DCCV 9/17 --Maintaining NSR with rates controlled s/p DCCV 9/17.  Developed atrial flutter 9/13. Recurrent atrial flutter 9/16 without response to IV digoxin and IV amiodarone; therefore, underwent DCCV 9/17. --Continue current amiodarone 200mg  daily.  Renewal of amiodarone 200 mg daily provided today, as it appears he is not currently taking this medication. --Continue current bisoprolol 2.5 mg daily.  Current rate well controlled. --Continue Xarelto for OAC. CHA2DS2VASc score of at least 5 (CHF, HTN, agex1, DM2 vascular). No s/sx of bleeding. He does have anemia of chronic dz.   CAD without angina --No CP or sx concerning for angina. S/p PCI to LAD 2010.  Continue Lipitor.  Continue Xarelto in lieu of ASA.  Continue beta-blocker.  No further ischemic work-up at this time.  COPD --Continue current breathing treatments/ inhalers.   He is currently on a prednisone course with recommendation that he continue amiodarone 200 mg daily as above.  HTN --BP taken in right  arm, as left arm is continuously lower than that of right 2/2 left subclavian artery stenosis.  BP today well controlled.  No signs or symptoms of presyncope or syncope.. Continue current beta-blocker and midodrine 10mg  q8h. Of note, he reports asx if SBP 90s.  HLD --LDL 46 from 09/2019 with LFTs wnl. Continue current atorvastatin.  L subclavian artery stenosis --As above, BP lower in the L arm and that of right arm with recommendation for R arm pressures at each clinic visit. His facility was called at his last visit to inform that R arm pressures should be taken when deciding if to administer medications. He reports he is asx if SBP 90s.  Periodic imaging recommended, as well as cholesterol control and ongoing Xarelto in lieu of ASA.  Anemia --Likely of chronic dz. Continue Xarelto for anticoagulation.  No reported signs or symptoms of bleeding.  Tobacco use / COPD --Smoking cessation advised.   He reports he is trying to cut back and down to 1/2 pack daily.  Ongoing work towards cessation encouraged.  Follow-up with PCP as directed.   Tremor --Amiodarone reportedly stopped in the past to see if washout improved tremor.  Per patient, this was unsuccessful.  At discharge, it was recommended he continue amiodarone; however, he is currently not on amiodarone.  Restart amiodarone 200 mg daily at this time.  Case discussed with another provider in the office with recommendation for referral to neurology for ongoing tremor.  He is agreeable to neurology referral today.   Medication changes: No changes. Restart amiodarone 200mg  daily as in discharge summary. Further recommendations, if needed, pending repeat BMET. Decision re: abx for cellulitis TBD by PCP.  Labs ordered: BMET Studies / Imaging ordered:  neurology referral, b/l carotids Future considerations: Repeat ReDS vest at RTC (26% 11/03/19) Disposition: RTC 1 month   , PA-C 12/08/2019

## 2019-12-13 ENCOUNTER — Inpatient Hospital Stay
Admission: EM | Admit: 2019-12-13 | Discharge: 2019-12-15 | DRG: 190 | Disposition: A | Discharge status: Home / Self Care | Payer: Medicare Other | Attending: Internal Medicine | Admitting: Internal Medicine

## 2019-12-13 ENCOUNTER — Emergency Department: Payer: Medicare Other

## 2019-12-13 DIAGNOSIS — E872 Acidosis, unspecified: Secondary | ICD-10-CM

## 2019-12-13 DIAGNOSIS — J441 Chronic obstructive pulmonary disease with (acute) exacerbation: Secondary | ICD-10-CM | POA: Diagnosis not present

## 2019-12-13 DIAGNOSIS — R410 Disorientation, unspecified: Secondary | ICD-10-CM

## 2019-12-13 DIAGNOSIS — I5032 Chronic diastolic (congestive) heart failure: Secondary | ICD-10-CM | POA: Diagnosis present

## 2019-12-13 DIAGNOSIS — F39 Unspecified mood [affective] disorder: Secondary | ICD-10-CM | POA: Diagnosis present

## 2019-12-13 DIAGNOSIS — N138 Other obstructive and reflux uropathy: Secondary | ICD-10-CM | POA: Diagnosis present

## 2019-12-13 DIAGNOSIS — Z794 Long term (current) use of insulin: Secondary | ICD-10-CM | POA: Diagnosis not present

## 2019-12-13 DIAGNOSIS — Z7952 Long term (current) use of systemic steroids: Secondary | ICD-10-CM

## 2019-12-13 DIAGNOSIS — I2510 Atherosclerotic heart disease of native coronary artery without angina pectoris: Secondary | ICD-10-CM | POA: Diagnosis present

## 2019-12-13 DIAGNOSIS — G894 Chronic pain syndrome: Secondary | ICD-10-CM | POA: Diagnosis present

## 2019-12-13 DIAGNOSIS — J9621 Acute and chronic respiratory failure with hypoxia: Secondary | ICD-10-CM | POA: Diagnosis present

## 2019-12-13 DIAGNOSIS — J9601 Acute respiratory failure with hypoxia: Secondary | ICD-10-CM | POA: Diagnosis not present

## 2019-12-13 DIAGNOSIS — J9622 Acute and chronic respiratory failure with hypercapnia: Secondary | ICD-10-CM | POA: Diagnosis present

## 2019-12-13 DIAGNOSIS — I4892 Unspecified atrial flutter: Secondary | ICD-10-CM | POA: Diagnosis present

## 2019-12-13 DIAGNOSIS — E1140 Type 2 diabetes mellitus with diabetic neuropathy, unspecified: Secondary | ICD-10-CM | POA: Diagnosis present

## 2019-12-13 DIAGNOSIS — K219 Gastro-esophageal reflux disease without esophagitis: Secondary | ICD-10-CM | POA: Diagnosis present

## 2019-12-13 DIAGNOSIS — Z955 Presence of coronary angioplasty implant and graft: Secondary | ICD-10-CM | POA: Diagnosis not present

## 2019-12-13 DIAGNOSIS — I110 Hypertensive heart disease with heart failure: Secondary | ICD-10-CM | POA: Diagnosis present

## 2019-12-13 DIAGNOSIS — Z7901 Long term (current) use of anticoagulants: Secondary | ICD-10-CM

## 2019-12-13 DIAGNOSIS — I4891 Unspecified atrial fibrillation: Secondary | ICD-10-CM | POA: Diagnosis present

## 2019-12-13 DIAGNOSIS — F17210 Nicotine dependence, cigarettes, uncomplicated: Secondary | ICD-10-CM | POA: Diagnosis present

## 2019-12-13 DIAGNOSIS — Z7951 Long term (current) use of inhaled steroids: Secondary | ICD-10-CM

## 2019-12-13 DIAGNOSIS — E875 Hyperkalemia: Secondary | ICD-10-CM | POA: Diagnosis present

## 2019-12-13 DIAGNOSIS — E785 Hyperlipidemia, unspecified: Secondary | ICD-10-CM | POA: Diagnosis present

## 2019-12-13 DIAGNOSIS — N179 Acute kidney failure, unspecified: Secondary | ICD-10-CM | POA: Diagnosis present

## 2019-12-13 DIAGNOSIS — Z79891 Long term (current) use of opiate analgesic: Secondary | ICD-10-CM

## 2019-12-13 DIAGNOSIS — N401 Enlarged prostate with lower urinary tract symptoms: Secondary | ICD-10-CM | POA: Diagnosis present

## 2019-12-13 DIAGNOSIS — Z79899 Other long term (current) drug therapy: Secondary | ICD-10-CM | POA: Diagnosis not present

## 2019-12-13 DIAGNOSIS — J189 Pneumonia, unspecified organism: Secondary | ICD-10-CM

## 2019-12-13 DIAGNOSIS — Z20822 Contact with and (suspected) exposure to covid-19: Secondary | ICD-10-CM | POA: Diagnosis present

## 2019-12-13 DIAGNOSIS — R338 Other retention of urine: Secondary | ICD-10-CM

## 2019-12-13 DIAGNOSIS — E8729 Other acidosis: Secondary | ICD-10-CM

## 2019-12-13 DIAGNOSIS — J9600 Acute respiratory failure, unspecified whether with hypoxia or hypercapnia: Secondary | ICD-10-CM | POA: Diagnosis present

## 2019-12-13 DIAGNOSIS — J9602 Acute respiratory failure with hypercapnia: Secondary | ICD-10-CM | POA: Diagnosis not present

## 2019-12-13 DIAGNOSIS — R0602 Shortness of breath: Secondary | ICD-10-CM | POA: Diagnosis not present

## 2019-12-13 LAB — URINALYSIS, COMPLETE (UACMP) WITH MICROSCOPIC
Bilirubin Urine: NEGATIVE
Glucose, UA: NEGATIVE mg/dL
Hgb urine dipstick: NEGATIVE
Ketones, ur: NEGATIVE mg/dL
Leukocytes,Ua: NEGATIVE
Nitrite: NEGATIVE
Protein, ur: NEGATIVE mg/dL
Specific Gravity, Urine: 1.015 (ref 1.005–1.030)
pH: 5 (ref 5.0–8.0)

## 2019-12-13 LAB — RESP PANEL BY RT-PCR (FLU A&B, COVID) ARPGX2
Influenza A by PCR: NEGATIVE
Influenza B by PCR: NEGATIVE
SARS Coronavirus 2 by RT PCR: NEGATIVE

## 2019-12-13 LAB — CBC WITH DIFFERENTIAL/PLATELET
Abs Immature Granulocytes: 0.03 10*3/uL (ref 0.00–0.07)
Basophils Absolute: 0 10*3/uL (ref 0.0–0.1)
Basophils Relative: 1 %
Eosinophils Absolute: 0 10*3/uL (ref 0.0–0.5)
Eosinophils Relative: 0 %
HCT: 38.1 % — ABNORMAL LOW (ref 39.0–52.0)
Hemoglobin: 10.9 g/dL — ABNORMAL LOW (ref 13.0–17.0)
Immature Granulocytes: 0 %
Lymphocytes Relative: 14 %
Lymphs Abs: 0.9 10*3/uL (ref 0.7–4.0)
MCH: 24.8 pg — ABNORMAL LOW (ref 26.0–34.0)
MCHC: 28.6 g/dL — ABNORMAL LOW (ref 30.0–36.0)
MCV: 86.8 fL (ref 80.0–100.0)
Monocytes Absolute: 0.8 10*3/uL (ref 0.1–1.0)
Monocytes Relative: 11 %
Neutro Abs: 5 10*3/uL (ref 1.7–7.7)
Neutrophils Relative %: 74 %
Platelets: 215 10*3/uL (ref 150–400)
RBC: 4.39 MIL/uL (ref 4.22–5.81)
RDW: 18.9 % — ABNORMAL HIGH (ref 11.5–15.5)
WBC: 6.7 10*3/uL (ref 4.0–10.5)
nRBC: 0.6 % — ABNORMAL HIGH (ref 0.0–0.2)

## 2019-12-13 LAB — PHOSPHORUS: Phosphorus: 5.8 mg/dL — ABNORMAL HIGH (ref 2.5–4.6)

## 2019-12-13 LAB — BASIC METABOLIC PANEL
Anion gap: 10 (ref 5–15)
BUN: 31 mg/dL — ABNORMAL HIGH (ref 8–23)
CO2: 37 mmol/L — ABNORMAL HIGH (ref 22–32)
Calcium: 8.7 mg/dL — ABNORMAL LOW (ref 8.9–10.3)
Chloride: 88 mmol/L — ABNORMAL LOW (ref 98–111)
Creatinine, Ser: 1.66 mg/dL — ABNORMAL HIGH (ref 0.61–1.24)
GFR, Estimated: 45 mL/min — ABNORMAL LOW (ref >60)
Glucose, Bld: 189 mg/dL — ABNORMAL HIGH (ref 70–99)
Potassium: 5.5 mmol/L — ABNORMAL HIGH (ref 3.5–5.1)
Sodium: 135 mmol/L (ref 135–145)

## 2019-12-13 LAB — COMPREHENSIVE METABOLIC PANEL
ALT: 18 U/L (ref 0–44)
AST: 31 U/L (ref 15–41)
Albumin: 3.8 g/dL (ref 3.5–5.0)
Alkaline Phosphatase: 110 U/L (ref 38–126)
Anion gap: 14 (ref 5–15)
BUN: 27 mg/dL — ABNORMAL HIGH (ref 8–23)
CO2: 32 mmol/L (ref 22–32)
Calcium: 9 mg/dL (ref 8.9–10.3)
Chloride: 88 mmol/L — ABNORMAL LOW (ref 98–111)
Creatinine, Ser: 1.93 mg/dL — ABNORMAL HIGH (ref 0.61–1.24)
GFR, Estimated: 38 mL/min — ABNORMAL LOW (ref >60)
Glucose, Bld: 164 mg/dL — ABNORMAL HIGH (ref 70–99)
Potassium: 6.5 mmol/L (ref 3.5–5.1)
Sodium: 134 mmol/L — ABNORMAL LOW (ref 135–145)
Total Bilirubin: 1 mg/dL (ref 0.3–1.2)
Total Protein: 7.3 g/dL (ref 6.5–8.1)

## 2019-12-13 LAB — BLOOD GAS, ARTERIAL
Acid-Base Excess: 10.8 mmol/L — ABNORMAL HIGH (ref 0.0–2.0)
Acid-Base Excess: 11.6 mmol/L — ABNORMAL HIGH (ref 0.0–2.0)
Acid-Base Excess: 7.6 mmol/L — ABNORMAL HIGH (ref 0.0–2.0)
Bicarbonate: 37.3 mmol/L — ABNORMAL HIGH (ref 20.0–28.0)
Bicarbonate: 38.2 mmol/L — ABNORMAL HIGH (ref 20.0–28.0)
Bicarbonate: 42.3 mmol/L — ABNORMAL HIGH (ref 20.0–28.0)
Expiratory PAP: 6
Expiratory PAP: 8
FIO2: 0.4
FIO2: 0.4
FIO2: 1
Inspiratory PAP: 18
Inspiratory PAP: 18
Mode: POSITIVE
Mode: POSITIVE
O2 Saturation: 95 %
O2 Saturation: 96.4 %
O2 Saturation: 96.7 %
Patient temperature: 37
Patient temperature: 37
Patient temperature: 37
RATE: 10 resp/min
pCO2 arterial: 101 mmHg (ref 32.0–48.0)
pCO2 arterial: 66 mmHg (ref 32.0–48.0)
pCO2 arterial: 85 mmHg (ref 32.0–48.0)
pH, Arterial: 7.23 — ABNORMAL LOW (ref 7.350–7.450)
pH, Arterial: 7.25 — ABNORMAL LOW (ref 7.350–7.450)
pH, Arterial: 7.37 (ref 7.350–7.450)
pO2, Arterial: 100 mmHg (ref 83.0–108.0)
pO2, Arterial: 78 mmHg — ABNORMAL LOW (ref 83.0–108.0)
pO2, Arterial: 99 mmHg (ref 83.0–108.0)

## 2019-12-13 LAB — CBG MONITORING, ED
Glucose-Capillary: 186 mg/dL — ABNORMAL HIGH (ref 70–99)
Glucose-Capillary: 220 mg/dL — ABNORMAL HIGH (ref 70–99)
Glucose-Capillary: 162 mg/dL — ABNORMAL HIGH (ref 70–99)

## 2019-12-13 LAB — LACTIC ACID, PLASMA
Lactic Acid, Venous: 2.3 mmol/L (ref 0.5–1.9)
Lactic Acid, Venous: 1.6 mmol/L (ref 0.5–1.9)

## 2019-12-13 LAB — RENAL FUNCTION PANEL
Albumin: 3.6 g/dL (ref 3.5–5.0)
Anion gap: 13 (ref 5–15)
BUN: 29 mg/dL — ABNORMAL HIGH (ref 8–23)
CO2: 35 mmol/L — ABNORMAL HIGH (ref 22–32)
Calcium: 8.9 mg/dL (ref 8.9–10.3)
Chloride: 90 mmol/L — ABNORMAL LOW (ref 98–111)
Creatinine, Ser: 1.27 mg/dL — ABNORMAL HIGH (ref 0.61–1.24)
GFR, Estimated: >60 mL/min (ref >60)
Glucose, Bld: 174 mg/dL — ABNORMAL HIGH (ref 70–99)
Phosphorus: 2.9 mg/dL (ref 2.5–4.6)
Potassium: 4.5 mmol/L (ref 3.5–5.1)
Sodium: 138 mmol/L (ref 135–145)

## 2019-12-13 LAB — TROPONIN I (HIGH SENSITIVITY)
Troponin I (High Sensitivity): 20 ng/L — ABNORMAL HIGH (ref <18)
Troponin I (High Sensitivity): 21 ng/L — ABNORMAL HIGH (ref <18)

## 2019-12-13 LAB — MAGNESIUM
Magnesium: 2.4 mg/dL (ref 1.7–2.4)
Magnesium: 2.5 mg/dL — ABNORMAL HIGH (ref 1.7–2.4)

## 2019-12-13 LAB — PROCALCITONIN: Procalcitonin: 0.19 ng/mL

## 2019-12-13 LAB — BRAIN NATRIURETIC PEPTIDE: B Natriuretic Peptide: 620.6 pg/mL — ABNORMAL HIGH (ref 0.0–100.0)

## 2019-12-13 MED ORDER — DOCUSATE SODIUM 100 MG PO CAPS: 100.0000 mg | ORAL_CAPSULE | Freq: Two times a day (BID) | ORAL | Status: DC | PRN

## 2019-12-13 MED ORDER — NICOTINE 21 MG/24HR TD PT24
21.0000 mg | MEDICATED_PATCH | Freq: Every day | TRANSDERMAL | Status: DC
  Administered 2019-12-13 – 2019-12-15 (×3): 21 mg via TRANSDERMAL
  Filled 2019-12-13 (×3): qty 1

## 2019-12-13 MED ORDER — SODIUM CHLORIDE 0.9 % IV SOLN
2.0000 g | Freq: Once | INTRAVENOUS | Status: AC
  Administered 2019-12-13: 2 g via INTRAVENOUS
  Filled 2019-12-13: qty 2

## 2019-12-13 MED ORDER — METHYLPREDNISOLONE SODIUM SUCC 40 MG IJ SOLR
40.0000 mg | Freq: Two times a day (BID) | INTRAMUSCULAR | Status: DC
  Administered 2019-12-13 – 2019-12-15 (×5): 40 mg via INTRAVENOUS
  Filled 2019-12-13 (×5): qty 1

## 2019-12-13 MED ORDER — INSULIN ASPART 100 UNIT/ML ~~LOC~~ SOLN
0.0000 [IU] | SUBCUTANEOUS | Status: DC
  Administered 2019-12-13: 5 [IU] via SUBCUTANEOUS
  Administered 2019-12-13 (×2): 3 [IU] via SUBCUTANEOUS
  Administered 2019-12-14: 8 [IU] via SUBCUTANEOUS
  Administered 2019-12-14 (×2): 3 [IU] via SUBCUTANEOUS
  Administered 2019-12-14: 5 [IU] via SUBCUTANEOUS
  Administered 2019-12-14: 8 [IU] via SUBCUTANEOUS
  Administered 2019-12-15 (×3): 5 [IU] via SUBCUTANEOUS
  Filled 2019-12-13 (×11): qty 1

## 2019-12-13 MED ORDER — ENOXAPARIN SODIUM 40 MG/0.4ML ~~LOC~~ SOLN
40.0000 mg | SUBCUTANEOUS | Status: DC
  Administered 2019-12-13 – 2019-12-15 (×3): 40 mg via SUBCUTANEOUS
  Filled 2019-12-13 (×4): qty 0.4

## 2019-12-13 MED ORDER — FUROSEMIDE 10 MG/ML IJ SOLN: 40.0000 mg | Freq: Every day | INTRAMUSCULAR | Status: DC

## 2019-12-13 MED ORDER — OXYCODONE HCL 5 MG PO TABS
5.0000 mg | ORAL_TABLET | Freq: Once | ORAL | Status: AC
  Administered 2019-12-13: 5 mg via ORAL
  Filled 2019-12-13: qty 1

## 2019-12-13 MED ORDER — METHYLPREDNISOLONE SODIUM SUCC 125 MG IJ SOLR
125.0000 mg | Freq: Once | INTRAMUSCULAR | Status: AC
  Administered 2019-12-13: 125 mg via INTRAVENOUS
  Filled 2019-12-13: qty 2

## 2019-12-13 MED ORDER — SALINE SPRAY 0.65 % NA SOLN
1.0000 | NASAL | Status: DC | PRN
  Filled 2019-12-13: qty 44

## 2019-12-13 MED ORDER — TAMSULOSIN HCL 0.4 MG PO CAPS
0.4000 mg | ORAL_CAPSULE | Freq: Every day | ORAL | Status: DC
  Administered 2019-12-13 – 2019-12-14 (×2): 0.4 mg via ORAL
  Filled 2019-12-13 (×2): qty 1

## 2019-12-13 MED ORDER — CALCIUM GLUCONATE-NACL 1-0.675 GM/50ML-% IV SOLN
1.0000 g | Freq: Once | INTRAVENOUS | Status: AC
  Administered 2019-12-13: 1000 mg via INTRAVENOUS
  Filled 2019-12-13: qty 50

## 2019-12-13 MED ORDER — IPRATROPIUM-ALBUTEROL 0.5-2.5 (3) MG/3ML IN SOLN
3.0000 mL | Freq: Four times a day (QID) | RESPIRATORY_TRACT | Status: DC
  Administered 2019-12-13 – 2019-12-15 (×10): 3 mL via RESPIRATORY_TRACT
  Filled 2019-12-13 (×10): qty 3

## 2019-12-13 MED ORDER — PANTOPRAZOLE SODIUM 40 MG IV SOLR
40.0000 mg | INTRAVENOUS | Status: DC
  Administered 2019-12-13 – 2019-12-15 (×3): 40 mg via INTRAVENOUS
  Filled 2019-12-13 (×3): qty 40

## 2019-12-13 MED ORDER — AMIODARONE HCL 200 MG PO TABS
200.0000 mg | ORAL_TABLET | Freq: Every day | ORAL | Status: DC
  Administered 2019-12-13 – 2019-12-15 (×3): 200 mg via ORAL
  Filled 2019-12-13 (×3): qty 1

## 2019-12-13 MED ORDER — IPRATROPIUM BROMIDE 0.02 % IN SOLN
0.5000 mg | Freq: Once | RESPIRATORY_TRACT | Status: AC
  Administered 2019-12-13: 0.5 mg via RESPIRATORY_TRACT
  Filled 2019-12-13: qty 2.5

## 2019-12-13 MED ORDER — POLYETHYLENE GLYCOL 3350 17 G PO PACK: 17.0000 g | PACK | Freq: Every day | ORAL | Status: DC | PRN

## 2019-12-13 MED ORDER — INSULIN GLARGINE 100 UNIT/ML ~~LOC~~ SOLN
22.0000 [IU] | Freq: Every day | SUBCUTANEOUS | Status: DC
  Administered 2019-12-13 – 2019-12-14 (×2): 22 [IU] via SUBCUTANEOUS
  Filled 2019-12-13 (×3): qty 0.22

## 2019-12-13 MED ORDER — ALBUTEROL SULFATE (2.5 MG/3ML) 0.083% IN NEBU
10.0000 mg | INHALATION_SOLUTION | Freq: Once | RESPIRATORY_TRACT | Status: AC
  Administered 2019-12-13: 10 mg via RESPIRATORY_TRACT
  Filled 2019-12-13: qty 12

## 2019-12-13 MED ORDER — FLUTICASONE PROPIONATE 50 MCG/ACT NA SUSP
1.0000 | Freq: Two times a day (BID) | NASAL | Status: DC
  Administered 2019-12-13 – 2019-12-15 (×4): 1 via NASAL
  Filled 2019-12-13 (×2): qty 16

## 2019-12-13 MED ORDER — SODIUM CHLORIDE 0.9 % IV BOLUS
1000.0000 mL | Freq: Once | INTRAVENOUS | Status: AC
  Administered 2019-12-13: 1000 mL via INTRAVENOUS

## 2019-12-13 MED ORDER — OXYCODONE HCL 5 MG PO TABS
10.0000 mg | ORAL_TABLET | Freq: Three times a day (TID) | ORAL | Status: DC
  Administered 2019-12-13 – 2019-12-15 (×6): 10 mg via ORAL
  Filled 2019-12-13 (×7): qty 2

## 2019-12-13 MED ORDER — BUDESONIDE 0.25 MG/2ML IN SUSP
0.2500 mg | Freq: Two times a day (BID) | RESPIRATORY_TRACT | Status: DC
  Administered 2019-12-13 – 2019-12-15 (×5): 0.25 mg via RESPIRATORY_TRACT
  Filled 2019-12-13 (×5): qty 2

## 2019-12-13 MED ORDER — SODIUM CHLORIDE 0.9 % IV SOLN
500.0000 mg | INTRAVENOUS | Status: AC
  Administered 2019-12-13 – 2019-12-15 (×3): 500 mg via INTRAVENOUS
  Filled 2019-12-13 (×3): qty 500

## 2019-12-13 MED ORDER — ATORVASTATIN CALCIUM 20 MG PO TABS
40.0000 mg | ORAL_TABLET | Freq: Every day | ORAL | Status: DC
  Administered 2019-12-13 – 2019-12-15 (×3): 40 mg via ORAL
  Filled 2019-12-13 (×3): qty 2

## 2019-12-13 MED ORDER — ACETAMINOPHEN 500 MG PO TABS
1000.0000 mg | ORAL_TABLET | Freq: Once | ORAL | Status: AC
  Administered 2019-12-13: 1000 mg via ORAL
  Filled 2019-12-13: qty 2

## 2019-12-13 NOTE — ED Notes (Signed)
Date and time results received: 12/13/19 1:25 AM  Test: Lactic  Critical Value: 2.3  Name of Provider Notified: Katrinka Blazing, MD   Orders Received? Or Actions Taken?: Actions Taken: Provider aware.

## 2019-12-13 NOTE — H&P (Signed)
NAME:  Gregory Hall, MRN:  [id number], DOB:  1955-03-08, LOS: 0 ADMISSION DATE:  12/13/2019, CONSULTATION DATE:  12/13/2019 REFERRING MD:  Dr. Adaline Sill, CHIEF COMPLAINT: Shortness of Breath  Brief History   64 yo male presenting to the ED with acute hypoxic & hypercapnic respiratory failure requiring BIPAP admitted to [facility].  History of present illness   64 year old male who presents to the ED from his local group home with shortness of breath and the inability to urinate.  He normally wears 3 L chronically nasal cannula at home. Upon arrival to the ED patient was altered and hypoxic with SPO2 readings in the 80s while on 10 L nasal cannula.  Patient was placed on BiPAP and initial ABG showed hypercarbia with a pH: 7.23 & PCO2: 101.  After 4 hours on BiPAP repeat ABG pH: 7.25 & PCO2: 85. PCCM consulted for ongoing BIPAP management and Stepdown admission.  Past Medical History  Hypertension Dyslipidemia Type 2 diabetes COPD CAD (s/p PTCA & stent x2) GERD HFpEF A-fib/A-flutter s/p DCCV 09/2019  Significant Hospital Events   12/13/19 Admit to Stepdown unit on BIPAP  Consults:    Procedures:    Significant Diagnostic Tests:    Micro Data:  12/13/19 COVID-19 >> negative 12/13/19 Influenza A/B >> negative 12/13/19 Blood cultures >>  Antimicrobials:   12/13/19 Cefepime x 1 12/13/19 Azithromycin >>  Interim history/subjective:  Patient sitting up on stretcher on BIPAP, eyes closed- appears to be resting comfortably. Patient denies being in pain, during discussion he says intermittently confused statements and then corrects himself. Denied any infection symptoms prior to admission and says his breathing feels better on the BIPAP  Labs/ Imaging personally reviewed Na+/ K+: 135/ 5.5 BUN/Cr.: 31/ 1.66 Hgb: 10.9  WBC/ TMAX: 6.7/ 36.8 Lactic/ PCT: 2.3~1.6/ pending BNP: 620 ABG: 7.25/ 85/ 100/ 37.3 CXR 12/13/19: mild LLL opacity, likely atelectasis  Objective   Blood  pressure 129/64, pulse 64, temperature 98.3 F (36.8 C), temperature source Oral, resp. rate 14, height 6' (1.829 m), weight 88 kg, SpO2 98 %.    FiO2 (%):  [50 %] 50 %  No intake or output data in the 24 hours ending 12/13/19 0506 Filed Weights   12/13/19 0104  Weight: 88 kg    Examination: General: Adult male, critically ill, lying in bed, on BIPAP- NAD HEENT: MM pink/ dry, anicteric, atraumatic, neck supple Neuro: A&O x 4*intermittently says confused statements, follows commands, PERRL +4, MAE CV: s1s2 RRR, NSR on monitor, no r/m/g Pulm: Regular, non labored on BIPAP @ 40%, breath sounds coarse with expiratory wheezing-BUL & diminished-BLL GI: soft, rounded, non tender, bs x 4 GU: foley in place with clear yellow urine Skin: scattered ecchymosis and scabbed abrasions Extremities: warm/dry, pulses + 2 R/P, +1 edema noted BLE  Resolved Hospital Problem list     Assessment & Plan:  Acute Hypoxic & Hypercapnic Respiratory Failure PMHX: COPD- chronic 3 L [place] Acute Exacerbation of Chronic COPD  PMHx: Asthma, COPD, tobacco use - BIPAP PRN, supplemental oxygen as needed, maintain SpO2 > 88% - Repeat ABG 12:00 - Continue Bronchodilators as needed - ICS >> Pulmicort BID - LAMA >> Duoneb Q 6 - Methylprednisolone IV 40 mg BID - IV Azithromycin  daily x 3 days - Follow cultures  - Trend PCT, monitor WBC/ fever curve  Acute Kidney Injury in the setting of Acute Urinary Retention PMHx: BPH Unable to void at group home, Initial BUN/Cr: 27/ 1.93. Foley placed in ED with 650 mL  out immediately. He received 1 L NS bolus in ED - Strict I/O's: alert provider if UOP < 0.5 mL/kg/hr - Daily BMP, replace electrolytes PRN - Avoid nephrotoxic agents as able, ensure adequate renal perfusion - hold home Flomax (until off BIPAP) & furosemide regimen due to Cr., reassess as patient stabilizes  T2DM - CBG monitor Q 4, target range 140-180 - SSI Q 4 moderate scale - continue home lantus  regimen: 22 units QHS - once off BIPAP can consider carb modified diet  HLD - continue home regimen: atorvastatin 40 mg QD  A-Fibrillation/ A-Flutter Currently in NSR - Continue home regimen: amiodarone 200 mg PO as tolerated - consider starting IV drip as needed, if patient is unable to take PO due to BIPAP use  Mood Disorder Chronic Pain - home regimen on hold due to lethargy and BIPAP use - consider restarting: lamictal, flexeril, lyrica & oxycodone as needed once patient stabilizes  Best practice (evaluated daily)  Diet: NPO Pain/Anxiety/Delirium protocol (if indicated): N/A VAP protocol (if indicated): N/A DVT prophylaxis: enoxaparin SQ GI prophylaxis: protonix Glucose control: CBG Q 4, SSI & home lantus Mobility: bedrest, mobilize as tolerated last date of multidisciplinary goals of care discussion: 12/13/19 Family and staff present: patient, APP & care RN Summary of discussion: patient wants to be a FULL code Follow up goals of care discussion due: as needed Code Status: FULL Disposition: SDU  Labs   CBC: Recent Labs  Lab 12/13/19 0042  WBC 6.7  NEUTROABS 5.0  HGB 10.9*  HCT 38.1*  MCV 86.8  PLT 215    Basic Metabolic Panel: Recent Labs  Lab 12/13/19 0042  NA 134*  K 6.5*  CL 88*  CO2 32  GLUCOSE 164*  BUN 27*  CREATININE 1.93*  CALCIUM 9.0  MG 2.4   GFR: Estimated Creatinine Clearance: 41.3 mL/min (A) (by C-G formula based on SCr of 1.93 mg/dL (H)). Recent Labs  Lab 12/13/19 0042  WBC 6.7  LATICACIDVEN 2.3*    Liver Function Tests: Recent Labs  Lab 12/13/19 0042  AST 31  ALT 18  ALKPHOS 110  BILITOT 1.0  PROT 7.3  ALBUMIN 3.8   No results for input(s): LIPASE, AMYLASE in the last 168 hours. No results for input(s): AMMONIA in the last 168 hours.  ABG    Component Value Date/Time   PHART 7.25 (L) 12/13/2019 0356   PCO2ART 85 (HH) 12/13/2019 0356   PO2ART 100 12/13/2019 0356   HCO3 37.3 (H) 12/13/2019 0356   TCO2 26  04/14/2009 0014   O2SAT 96.7 12/13/2019 0356     Coagulation Profile: No results for input(s): INR, PROTIME in the last 168 hours.  Cardiac Enzymes: No results for input(s): CKTOTAL, CKMB, CKMBINDEX, TROPONINI in the last 168 hours.  HbA1C: Hemoglobin A1C  Date/Time Value Ref Range Status  08/24/2011 01:10 AM 8.3 (H) 4.2 - 6.3 % Final    Comment:    The American Diabetes Association recommends that a primary goal of therapy should be <7% and that physicians should reevaluate the treatment regimen in patients with HbA1c values consistently >8%.   07/15/2011 05:03 AM 8.1 (H) 4.2 - 6.3 % Final    Comment:    The American Diabetes Association recommends that a primary goal of therapy should be <7% and that physicians should reevaluate the treatment regimen in patients with HbA1c values consistently >8%.    Hgb A1c MFr Bld  Date/Time Value Ref Range Status  10/23/2019 04:11 AM 6.5 (H) 4.8 -  5.6 % Final    Comment:    (NOTE)         Prediabetes: 5.7 - 6.4         Diabetes: >6.4         Glycemic control for adults with diabetes: <7.0   09/13/2019 06:46 AM 7.0 (H) 4.8 - 5.6 % Final    Comment:    (NOTE) Pre diabetes:          5.7%-6.4%  Diabetes:              >6.4%  Glycemic control for   <7.0% adults with diabetes     CBG: No results for input(s): GLUCAP in the last 168 hours.  Review of Systems: Positives in BOLD   Gen: Denies fever, chills, weight change, fatigue, night sweats HEENT: Denies blurred vision, double vision, hearing loss, tinnitus, sinus congestion, rhinorrhea, sore throat, neck stiffness, dysphagia PULM: Denies shortness of breath, cough, sputum production, hemoptysis, wheezing CV: Denies chest pain, edema, orthopnea, paroxysmal nocturnal dyspnea, palpitations GI: Denies abdominal pain, nausea, vomiting, diarrhea, hematochezia, melena, constipation, change in bowel habits GU: Denies dysuria, hematuria, polyuria, oliguria, urethral discharge Endocrine:  Denies hot or cold intolerance, polyuria, polyphagia or appetite change Derm: Denies rash, dry skin, scaling or peeling skin change Heme: Denies easy bruising, bleeding, bleeding gums Neuro: Denies headache, numbness, weakness, slurred speech, loss of memory or consciousness  Past Medical History  He,  has a past medical history of AKI (acute kidney injury) (HCC) (09/12/2019), CAD (coronary artery disease), Chest pain, CHF (congestive heart failure) (HCC), Chronic pain syndrome, COPD (chronic obstructive pulmonary disease) (HCC), Degenerative cervical disc, Depression, Diabetes mellitus without complication (HCC), Dyslipidemia, GERD (gastroesophageal reflux disease), Hernia (2014), Hypertension, MRSA (methicillin resistant staph aureus) culture positive (2011), Neuropathy, [place]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   Surgical History    Past Surgical History:  Procedure Laterality Date  . BACK SURGERY  2012  . CARDIOVERSION N/A 09/26/2019   Procedure: CARDIOVERSION;  Surgeon: Antonieta Iba, MD;  Location: [facility] ORS;  Service: Cardiovascular;  Laterality: N/A;  . CHOLECYSTECTOMY    . COLONOSCOPY  Jan 2014   [name]  . CORONARY ANGIOPLASTY WITH STENT PLACEMENT  2009   stents x2, in [place], [place]  . FOOT SURGERY     Right  . NECK SURGERY    . SPINE SURGERY  2012,2013  . TONSILLECTOMY       Social History   reports that he has been smoking. He has a 15.00 pack-year smoking history. He has never used smokeless tobacco. He reports that he does not drink alcohol and does not use drugs.   Family History   His Family history is unknown by patient.   Allergies Allergies  Allergen Reactions  . Acetaminophen Other (See Comments)    Reaction:  Unknown  Kidney failure Told not to take from home M.D. Related to kidney and renal failure   . Fentanyl   . Nsaids Other (See Comments)    Reaction:  Unknown  Kidney failure Patient states not to take from home M.D. Related to  kidney and renal failure.  Other reaction(s): Unknown     Home Medications  Prior to Admission medications   Medication Sig Start Date End Date Taking? Authorizing Provider  amiodarone (PACERONE) 200 MG tablet Take 1 tablet (200 mg total) by mouth daily. 11/03/19   Marisue Ivan D, PA-C  atorvastatin (LIPITOR) 40 MG tablet Take 1 tablet (40 mg total) by mouth daily. 09/28/19  Lewie Chamber, MD  bisoprolol (ZEBETA) 5 MG tablet Take 2.5 mg by mouth daily.     [provider]  Cholecalciferol (VITAMIN D3) 50 MCG (2000 UT) TABS Take 2,000 Units by mouth daily.     [provider]  cyclobenzaprine (FLEXERIL) 5 MG tablet Take 1 tablet (5 mg total) by mouth 3 (three) times daily as needed. Patient taking differently: Take 5 mg by mouth 2 (two) times daily as needed for muscle spasms.  05/25/19   Menshew, Charlesetta Ivory, PA-C  DULoxetine (CYMBALTA) 60 MG capsule Take 60 mg by mouth daily.    [provider]  Ensure Max Protein (ENSURE MAX PROTEIN) LIQD Take 330 mLs (11 oz total) by mouth 2 (two) times daily between meals. 06/14/19   Sheikh, Omair Latif, DO  Fluticasone-Umeclidin-Vilant (TRELEGY ELLIPTA) 100-62.5-25 MCG/INH AEPB Inhale 1 puff into the lungs daily.  10/20/19   [provider]  furosemide (LASIX) 40 MG tablet Take 1 tablet (40 mg total) by mouth daily. 10/28/19   Wouk, Wilfred Curtis, MD  insulin glargine (LANTUS) 100 UNIT/ML injection Inject 22 Units into the skin at bedtime.     [provider]  insulin lispro (HUMALOG) 100 UNIT/ML injection Inject 4 Units into the skin See admin instructions. Inject 4 units with blood sugar 150-250; inject 6 units 251-350; greater than 351 call provider; Hold for FSBG <150    [provider]  ipratropium-albuterol (DUONEB) 0.5-2.5 (3) MG/3ML SOLN Take 3 mLs by nebulization every 6 (six) hours as needed. Patient taking differently: Take 3 mLs by nebulization in the morning, at noon, and at bedtime.   06/14/19   Marguerita Merles Latif, DO  lactulose (CHRONULAC) 10 GM/15ML solution Take 20 g by mouth daily as needed for mild constipation.    [provider]  lamoTRIgine (LAMICTAL) 100 MG tablet Take 100 mg by mouth daily.    [provider]  Menthol-Methyl Salicylate (SALONPAS PAIN RELIEF PATCH EX) Place 1 patch onto the skin every 12 (twelve) hours as needed (aches and pains).    [provider]  midodrine (PROAMATINE) 10 MG tablet Take 1 tablet (10 mg total) by mouth every 8 (eight) hours. 09/28/19   Lewie Chamber, MD  NAC 600 MG CAPS Take 600 mg by mouth daily. 09/08/19   [provider]  nicotine (NICODERM CQ - DOSED IN MG/24 HOURS) 21 mg/24hr patch Place onto the skin. Patient not taking: Reported on 10/21/2019    [provider]  NITROSTAT 0.4 MG SL tablet DISSOLVE (1) TABLET UNDER TONGUE AS NEEDED TO RELIEVE CHEST PAIN. MAYREPEAT EVERY 5 MINUTES. Patient taking differently: Place 0.4 mg under the tongue every 5 (five) minutes as needed for chest pain.  10/06/15   Iran Ouch, MD  Oxycodone HCl 10 MG TABS Take 1 tablet (10 mg total) by mouth 3 (three) times daily. 09/14/19   Danford, Earl Lites, MD  oxymetazoline (AFRIN) 0.05 % nasal spray Place 2 sprays into both nostrils 2 (two) times daily as needed for congestion.    [provider]  pantoprazole (PROTONIX) 40 MG tablet Take 40 mg by mouth daily.     [provider]  predniSONE (DELTASONE) 5 MG tablet Take 2.5 mg by mouth daily with breakfast.    [provider]  pregabalin (LYRICA) 200 MG capsule Take 1 capsule (200 mg total) by mouth 3 (three) times daily. 09/14/19   Danford, Earl Lites, MD  rivaroxaban (XARELTO) 20 MG TABS tablet Take 20 mg by  mouth daily with supper.    [provider]  roflumilast (DALIRESP) 500 MCG TABS tablet Take 250 mcg by mouth daily.    [provider]  senna-docusate (SENOKOT-S) 8.6-50 MG tablet Take 1 tablet by mouth  daily.     [provider]  tamsulosin (FLOMAX) 0.4 MG CAPS capsule Take 1 capsule (0.4 mg total) by mouth daily after supper. Patient not taking: Reported on 10/21/2019 10/10/19   Lynn Ito, MD  tiotropium (SPIRIVA) 18 MCG inhalation capsule Place into inhaler and inhale. Patient not taking: Reported on 10/21/2019    [provider]  traZODone (DESYREL) 100 MG tablet Take 100 mg by mouth at bedtime.  09/08/19   [provider]     Critical care time: 55 minutes      Betsey Holiday, AGACNP-BC Acute Care Nurse Practitioner [facility]   [phone] / [phone] Please see Amion for pager details.

## 2019-12-13 NOTE — ED Notes (Addendum)
Pt taken off Bipap and placed on Nasal canula at 4 liters. Dr. Jayme Cloud at bedside. Per Dr. Jayme Cloud ok for sats to be between 88-92%  Pt asking for pain medication and IV lasix. Dr. Jayme Cloud is aware.

## 2019-12-13 NOTE — Progress Notes (Addendum)
ABG drawn.  Critical valuse CO2 66 called to Dr. Jayme Cloud.  She agreed that this was his baseline and to trial patient off of the BIPAP.  I spoke with patient's RN about trialing patient off at this time. She asked that we wait she is very busy with a CODE stroke and this patient's saturations drop quickly when taken off of the BIPAP.  I told her absolutely I understood and I laid the nasal canula at bedside.

## 2019-12-13 NOTE — ED Notes (Signed)
This RN at bedside due to BiPAP machine alarming and oxygen sats reading 79%. Pt with BiPAP mask off and in hand. This RN placed mask back on pt. This RN explained the importance of keeping the mask on until his breathing improves. Oxygen sats now 94% Primary RN made aware.

## 2019-12-13 NOTE — ED Notes (Signed)
This RN to bedside due to alarm beeping that pts SpO2 was in the 70's. RN in room to find pt had taken his nasal canula off. Pt stated that it "blew off". RN told pt that he must keep his oxygen on so that we can keep his sats up. Dr. Jayme Cloud at bedside and also encouraged pt to keep oxygen on.

## 2019-12-13 NOTE — ED Notes (Signed)
Date and time results received: 12/13/19  (use smartphrase ".now" to insert current time)  Test: potassium Critical Value: 6.5  Name of Provider Notified: Dr. Katrinka Blazing  Orders Received? Or Actions Taken?: acknowleged

## 2019-12-13 NOTE — ED Notes (Signed)
Bladder scan performed, of 3 scans, lowest was >683. Provider aware. VO for foley at this time.

## 2019-12-13 NOTE — ED Notes (Signed)
Pt resting comfortably at this time. NAD noted. Warm blanket provided to pt. VSS. Call bell in reach.

## 2019-12-13 NOTE — ED Notes (Signed)
Assisting primary RN, pt medicated per MAR. Per Dr. Katrinka Blazing tylenol is okay to administer,pt agrees. Warm blanket and pillow provided. Pt alert at this time. Side rails up x2

## 2019-12-13 NOTE — ED Notes (Signed)
Respiratory therapist called to complete ABG.

## 2019-12-13 NOTE — ED Provider Notes (Addendum)
[facility] Emergency Department Provider Note ____________________________________________   First MD Initiated Contact with Patient 12/13/19 0031     (approximate)  I have reviewed the triage vital signs and the nursing notes.  HISTORY  Chief Complaint Shortness of Breath   HPI Gregory Hall is a 64 y.o. malewho presents to the ED for evaluation of shortness of breath.  Chart review indicates 10/13-10/18 admission to our facility due to severe COPD exacerbation requiring intubation upon presentation. Typically remains on 3 L nasal cannula due to COPD.  A. fib on Xarelto.  HTN, HLD, CAD s/p stenting, preserved ejection fraction, DM on insulin.  He lives in a local group home.  Patient presents from his local group home with complaints of a few days of increasing shortness of breath, lower extremity swelling and lack of urinary output.  Patient reports someone else called 911 for him and he reports explicit concern for kidney failure due to his lack of urination.  He reports urge to void but the inability to do so.  History somewhat limited due to patient's refusal to participate and altered mental status.  Past Medical History:  Diagnosis Date  . AKI (acute kidney injury) (HCC) 09/12/2019  . CAD (coronary artery disease)    s/p PTCA and stent x2  . Chest pain   . CHF (congestive heart failure) (HCC)   . Chronic pain syndrome   . COPD (chronic obstructive pulmonary disease) (HCC)   . Degenerative cervical disc   . Depression   . Diabetes mellitus without complication (HCC)   . Dyslipidemia   . GERD (gastroesophageal reflux disease)   . Hernia 2014  . Hypertension   . MRSA (methicillin resistant staph aureus) culture positive 2011  . Neuropathy   . Nutcracker esophagus   . Rectus diastasis 07/19/2012    Patient Active Problem List   Diagnosis Date Noted  . Acute respiratory failure (HCC) 12/13/2019  . Palliative care by specialist   .  DNR (do not resuscitate)   . Acute CHF (congestive heart failure) (HCC) 10/08/2019  . Chronic respiratory failure with hypoxia (HCC) 10/08/2019  . Atrial flutter (HCC)   . Acute on chronic respiratory failure with hypoxia (HCC) 09/13/2019  . Acute diastolic CHF (congestive heart failure) (HCC) 09/12/2019  . NSTEMI (non-ST elevated myocardial infarction) (HCC) 09/12/2019  . Acute kidney failure, unspecified (HCC) 09/12/2019  . Diabetes mellitus type 2, uncontrolled, with complications (HCC) 06/10/2019  . Chronic pain syndrome 06/10/2019  . COPD exacerbation (HCC) 06/10/2019  . COPD with acute exacerbation (HCC) 06/09/2019  . CAD (coronary artery disease) 06/09/2019  . HTN (hypertension) 06/09/2019  . Acute on chronic respiratory failure with hypoxia and hypercapnia (HCC) 06/09/2019  . Anxiety 06/09/2019  . Chronic prescription opiate use 06/09/2019  . Hyperglycemia 06/09/2019  . Subclavian artery stenosis, left (HCC) 08/20/2014  . Rectus diastasis 07/19/2012  . Hernia   . Edema 05/10/2011  . Goals of care, counseling/discussion 10/06/2010  . SMOKER 11/25/2009  . CAROTID BRUIT, RIGHT 11/24/2009  . Chest pain 11/24/2009  . Hyperlipidemia 03/30/2009  . Coronary atherosclerosis 03/30/2009  . HYPERTENSION, HX OF 03/30/2009    Past Surgical History:  Procedure Laterality Date  . BACK SURGERY  2012  . CARDIOVERSION N/A 09/26/2019   Procedure: CARDIOVERSION;  Surgeon: Antonieta Iba, MD;  Location: [facility] ORS;  Service: Cardiovascular;  Laterality: N/A;  . CHOLECYSTECTOMY    . COLONOSCOPY  Jan 2014   [name]  . CORONARY ANGIOPLASTY WITH STENT PLACEMENT  2009   stents x2, in [place], [place]  . FOOT SURGERY     Right  . NECK SURGERY    . SPINE SURGERY  2012,2013  . TONSILLECTOMY      Prior to Admission medications   Medication Sig Start Date End Date Taking? Authorizing Provider  amiodarone (PACERONE) 200 MG tablet Take 1 tablet (200 mg total) by mouth daily. 11/03/19   Marisue Ivan D, PA-C  atorvastatin (LIPITOR) 40 MG tablet Take 1 tablet (40 mg total) by mouth daily. 09/28/19   Lewie Chamber, MD  bisoprolol (ZEBETA) 5 MG tablet Take 2.5 mg by mouth daily.     [provider]  Cholecalciferol (VITAMIN D3) 50 MCG (2000 UT) TABS Take 2,000 Units by mouth daily.     [provider]  cyclobenzaprine (FLEXERIL) 5 MG tablet Take 1 tablet (5 mg total) by mouth 3 (three) times daily as needed. Patient taking differently: Take 5 mg by mouth 2 (two) times daily as needed for muscle spasms.  05/25/19   Menshew, Charlesetta Ivory, PA-C  DULoxetine (CYMBALTA) 60 MG capsule Take 60 mg by mouth daily.    [provider]  Ensure Max Protein (ENSURE MAX PROTEIN) LIQD Take 330 mLs (11 oz total) by mouth 2 (two) times daily between meals. 06/14/19   Sheikh, Omair Latif, DO  Fluticasone-Umeclidin-Vilant (TRELEGY ELLIPTA) 100-62.5-25 MCG/INH AEPB Inhale 1 puff into the lungs daily.  10/20/19   [provider]  furosemide (LASIX) 40 MG tablet Take 1 tablet (40 mg total) by mouth daily. 10/28/19   Wouk, Wilfred Curtis, MD  insulin glargine (LANTUS) 100 UNIT/ML injection Inject 22 Units into the skin at bedtime.     [provider]  insulin lispro (HUMALOG) 100 UNIT/ML injection Inject 4 Units into the skin See admin instructions. Inject 4 units with blood sugar 150-250; inject 6 units 251-350; greater than 351 call provider; Hold for FSBG <150    [provider]  ipratropium-albuterol (DUONEB) 0.5-2.5 (3) MG/3ML SOLN Take 3 mLs by nebulization every 6 (six) hours as needed. Patient taking differently: Take 3 mLs by nebulization in the morning, at noon, and at bedtime.  06/14/19   Marguerita Merles Latif, DO  lactulose (CHRONULAC) 10 GM/15ML solution Take 20 g by mouth daily as needed for mild constipation.    [provider]  lamoTRIgine (LAMICTAL) 100 MG tablet Take 100 mg by mouth daily.    [provider]  Menthol-Methyl  Salicylate (SALONPAS PAIN RELIEF PATCH EX) Place 1 patch onto the skin every 12 (twelve) hours as needed (aches and pains).    [provider]  midodrine (PROAMATINE) 10 MG tablet Take 1 tablet (10 mg total) by mouth every 8 (eight) hours. 09/28/19   Lewie Chamber, MD  NAC 600 MG CAPS Take 600 mg by mouth daily. 09/08/19   [provider]  nicotine (NICODERM CQ - DOSED IN MG/24 HOURS) 21 mg/24hr patch Place onto the skin. Patient not taking: Reported on 10/21/2019    [provider]  NITROSTAT 0.4 MG SL tablet DISSOLVE (1) TABLET UNDER TONGUE AS NEEDED TO RELIEVE CHEST PAIN. MAYREPEAT EVERY 5 MINUTES. Patient taking differently: Place 0.4 mg under the tongue every 5 (five) minutes as needed for chest pain.  10/06/15   Iran Ouch, MD  Oxycodone HCl 10 MG TABS Take 1 tablet (10 mg total) by mouth 3 (three) times daily. 09/14/19   Danford, Earl Lites, MD  oxymetazoline (AFRIN) 0.05 % nasal spray Place 2  sprays into both nostrils 2 (two) times daily as needed for congestion.    [provider]  pantoprazole (PROTONIX) 40 MG tablet Take 40 mg by mouth daily.     [provider]  predniSONE (DELTASONE) 5 MG tablet Take 2.5 mg by mouth daily with breakfast.    [provider]  pregabalin (LYRICA) 200 MG capsule Take 1 capsule (200 mg total) by mouth 3 (three) times daily. 09/14/19   Danford, Earl Lites, MD  rivaroxaban (XARELTO) 20 MG TABS tablet Take 20 mg by mouth daily with supper.    [provider]  roflumilast (DALIRESP) 500 MCG TABS tablet Take 250 mcg by mouth daily.    [provider]  senna-docusate (SENOKOT-S) 8.6-50 MG tablet Take 1 tablet by mouth daily.     [provider]  tamsulosin (FLOMAX) 0.4 MG CAPS capsule Take 1 capsule (0.4 mg total) by mouth daily after supper. Patient not taking: Reported on 10/21/2019 10/10/19   Lynn Ito, MD  tiotropium (SPIRIVA) 18 MCG inhalation capsule Place into inhaler  and inhale. Patient not taking: Reported on 10/21/2019    [provider]  traZODone (DESYREL) 100 MG tablet Take 100 mg by mouth at bedtime.  09/08/19   [provider]    Allergies Acetaminophen, Fentanyl, and Nsaids  Family History  Family history unknown: Yes    Social History Social History   Tobacco Use  . Smoking status: Current Every Day Smoker    Packs/day: 0.50    Years: 30.00    Pack years: 15.00  . Smokeless tobacco: Never Used  Substance Use Topics  . Alcohol use: No  . Drug use: No    Review of Systems  Unable to be accurately assessed due to patient's altered mentation.  ____________________________________________   PHYSICAL EXAM:  VITAL SIGNS: Vitals:   12/13/19 0330 12/13/19 0345  BP: 129/64   Pulse: 69 64  Resp: 18 14  Temp:    SpO2: 97% 98%     Constitutional: Sleepy and confused.  He awakens to loud vocal and noxious stimulations, follows commands in all 4 extremities without apparent deficit.  Sitting up in bed. Eyes: Conjunctivae are normal. PERRL. EOMI. Head: Atraumatic. Nose: No congestion/rhinnorhea. Mouth/Throat: Mucous membranes are dry.  Oropharynx non-erythematous. Neck: No stridor. No cervical spine tenderness to palpation. Cardiovascular: Normal rate, regular rhythm. Grossly normal heart sounds.  Good peripheral circulation. Respiratory: No tachypnea or retractions.  Poor air movement throughout with diffuse expiratory wheezes and bibasilar crackles. Gastrointestinal: Soft , nondistended, nontender to palpation. No CVA tenderness. Musculoskeletal: No lower extremity tenderness.  No joint effusions. No signs of acute trauma.  Pitting edema to bilateral lower extremities without overlying skin changes or signs of trauma. Neurologic: No gross focal neurologic deficits are appreciated.  Skin:  Skin is warm, dry and intact. No rash noted. Psychiatric: Mood and affect are normal. Speech and behavior are  normal.  ____________________________________________   LABS (all labs ordered are listed, but only abnormal results are displayed)  Labs Reviewed  CBC WITH DIFFERENTIAL/PLATELET - Abnormal; Notable for the following components:      Result Value   Hemoglobin 10.9 (*)    HCT 38.1 (*)    MCH 24.8 (*)    MCHC 28.6 (*)    RDW 18.9 (*)    nRBC 0.6 (*)    All other components within normal limits  COMPREHENSIVE METABOLIC PANEL - Abnormal; Notable for the following components:   Sodium 134 (*)  Potassium 6.5 (*)    Chloride 88 (*)    Glucose, Bld 164 (*)    BUN 27 (*)    Creatinine, Ser 1.93 (*)    GFR, Estimated 38 (*)    All other components within normal limits  LACTIC ACID, PLASMA - Abnormal; Notable for the following components:   Lactic Acid, Venous 2.3 (*)    All other components within normal limits  BLOOD GAS, ARTERIAL - Abnormal; Notable for the following components:   pH, Arterial 7.23 (*)    pCO2 arterial 101 (*)    Bicarbonate 42.3 (*)    Acid-Base Excess 11.6 (*)    All other components within normal limits  BRAIN NATRIURETIC PEPTIDE - Abnormal; Notable for the following components:   B Natriuretic Peptide 620.6 (*)    All other components within normal limits  URINALYSIS, COMPLETE (UACMP) WITH MICROSCOPIC - Abnormal; Notable for the following components:   Color, Urine YELLOW (*)    APPearance CLEAR (*)    Bacteria, UA RARE (*)    All other components within normal limits  BLOOD GAS, ARTERIAL - Abnormal; Notable for the following components:   pH, Arterial 7.25 (*)    pCO2 arterial 85 (*)    Bicarbonate 37.3 (*)    Acid-Base Excess 7.6 (*)    All other components within normal limits  TROPONIN I (HIGH SENSITIVITY) - Abnormal; Notable for the following components:   Troponin I (High Sensitivity) 20 (*)    All other components within normal limits  TROPONIN I (HIGH SENSITIVITY) - Abnormal; Notable for the following components:   Troponin I (High  Sensitivity) 21 (*)    All other components within normal limits  RESP PANEL BY RT-PCR (FLU A&B, COVID) ARPGX2  CULTURE, BLOOD (SINGLE)  MAGNESIUM   ____________________________________________  12 Lead EKG  Sinus rhythm, rate of 70 bpm.  Normal axis.  Normal intervals.  No evidence of acute ischemia. ____________________________________________  RADIOLOGY  ED MD interpretation: 1 view CXR reviewed by me with LLL infiltrate  Official radiology report(s): DG Chest Portable 1 View  Result Date: 12/13/2019 CLINICAL DATA:  COPD EXAM: PORTABLE CHEST 1 VIEW COMPARISON:  10/24/2019 FINDINGS: Mild opacity at the left lung base. Endotracheal tube is no longer present. No focal consolidation. IMPRESSION: Mild opacity at the left lung base, likely atelectasis. Electronically Signed   By: Deatra Robinson M.D.   On: 12/13/2019 01:00    ____________________________________________   PROCEDURES and INTERVENTIONS  Procedure(s) performed (including Critical Care):  .1-3 Lead EKG Interpretation Performed by: Delton Prairie, MD Authorized by: Delton Prairie, MD     Interpretation: normal     ECG rate:  70   ECG rate assessment: normal     Rhythm: sinus rhythm     Ectopy: none     Conduction: normal   .Critical Care Performed by: Delton Prairie, MD Authorized by: Delton Prairie, MD   Critical care provider statement:    Critical care time (minutes):  75   Critical care was necessary to treat or prevent imminent or life-threatening deterioration of the following conditions:  Respiratory failure   Critical care was time spent personally by me on the following activities:  Discussions with consultants, evaluation of patient's response to treatment, examination of patient, ordering and performing treatments and interventions, ordering and review of laboratory studies, ordering and review of radiographic studies, pulse oximetry, re-evaluation of patient's condition, obtaining history from patient or  surrogate and review of old charts    Medications  albuterol (PROVENTIL) (2.5 MG/3ML) 0.083% nebulizer solution 10 mg (10 mg Nebulization Given 12/13/19 0110)  ipratropium (ATROVENT) nebulizer solution 0.5 mg (0.5 mg Nebulization Given 12/13/19 0110)  methylPREDNISolone sodium succinate (SOLU-MEDROL) 125 mg/2 mL injection 125 mg (125 mg Intravenous Given 12/13/19 0115)  ceFEPIme (MAXIPIME) 2 g in sodium chloride 0.9 % 100 mL IVPB (0 g Intravenous Stopped 12/13/19 0249)  calcium gluconate 1 g/ 50 mL sodium chloride IVPB (0 g Intravenous Stopped 12/13/19 0320)  sodium chloride 0.9 % bolus 1,000 mL (1,000 mLs Intravenous New Bag/Given 12/13/19 0155)  oxyCODONE (Oxy IR/ROXICODONE) immediate release tablet 5 mg (5 mg Oral Given 12/13/19 0226)  acetaminophen (TYLENOL) tablet 1,000 mg (1,000 mg Oral Given 12/13/19 0226)    ____________________________________________   MDM / ED COURSE   64 year old with COPD presents to the ED with COPD exacerbation requiring BiPAP.  Hemodynamically stable without fever, but quite hypoxic with sats in the 80s despite 10 L.  Patient quickly started on BiPAP upon arrival with rapid improvement of his saturations, and eventual improvement of his mental status.  Initially is quite lethargic and confused, likely due to CO2 narcosis.  ABG shows a PaCO2 of 101, likely the source of his nonfocal neurologic depression.  He has no evidence of neurovascular deficits focally.  No signs of trauma.  His CXR does show a small LLL infiltrate, and he does have a lactic acidosis.  Considering his recent admission a couple months ago, concern of the possibility of HCAP and so blood cultures were drawn and patient was provided cefepime.  Additionally, patient with hyperkalemia and acute urinary retention requiring Foley catheter placement.  Hyperkalemia was temporized with calcium gluconate and patient was already provided a large dose of albuterol.  We will admit the patient to the ICU for  further work-up and management.  Clinical Course as of Dec 12 504  Sat Dec 13, 2019  0045 Improving sats on BiPAP.  Continues to be somnolent.  Answers some questions appropriately.   [DS]  0050 CXR reviewed.  Tiny pleural effusion on the right.  Cephalization suggestive of increased pulmonary vascular congestion.   [DS]  1610 Bladder scan with 680-800 on multiple checks.  Patient still unable to void.  We will place Foley   [DS]  0125 Reassessed.  Patient's responses are little more sharp and he is clinically improving on BiPAP.  Still pulling reasonable volumes   [DS]  0126 Informed of elevated lactic to 2.3.  He has no complaints of fever and has no leukocytosis, but has a possible infiltrate to his LLL.  We will empirically treat for HCAP   [DS]  0140 Notified of elevated potassium to 6.5. Calcium and fluids ordered. Awaiting remainder of metabolic panel for glucose for possible insulin administration.   [DS]  0205 Patient much more awake and conversational.  He is reporting severe chronic lower back pain and requesting some medications of 10 mg oxycodone.  He reports being on this dose for many years.  Denies any falls or trauma to his back.  Reports this feels like his typical pain.  I order 5 mg oxycodone and a gram of Tylenol   [DS]  0420 I spoke with Dr. Arville Care, admitting hospitalist, who felt uncomfortable with patient having evidence of multiorgan failure and requests that I consult the ICU.  I subsequently speak with the intensivist nurse practitioner who is on overnight and explained patient's presentation and work-up.  She indicates that they have no open beds in ICU, that she  is happy to admit the patient, but does not have any additional interventions to add to the patient's treatment plan.  She remains available for any questions or admission.   [DS]  1610 Back and forth with intensivist NP and hospitalist.  Repeat ABG improved, but still acidotic and hypercarbic.  Intensivist  NP agrees to admit.   [DS]    Clinical Course User Index [DS] Delton Prairie, MD    ____________________________________________   FINAL CLINICAL IMPRESSION(S) / ED DIAGNOSES  Final diagnoses:  COPD exacerbation (HCC)  Acute urinary retention  Respiratory acidosis  Lactic acidosis  HCAP (healthcare-associated pneumonia)  Hyperkalemia  Confusion     ED Discharge Orders    None       Ameya Vowell   Note:  This document was prepared using Dragon voice recognition software and may include unintentional dictation errors.   Delton Prairie, MD 12/13/19 9604    Delton Prairie, MD 12/13/19 5409    Delton Prairie, MD 12/13/19 [phone]

## 2019-12-13 NOTE — ED Notes (Signed)
Dr. Jayme Cloud at bedside. After breathing TX's after are finished will trial pt on [place] and give PO meds and see if pt can tolerate.

## 2019-12-14 ENCOUNTER — Other Ambulatory Visit: Payer: Self-pay

## 2019-12-14 DIAGNOSIS — J9602 Acute respiratory failure with hypercapnia: Secondary | ICD-10-CM

## 2019-12-14 DIAGNOSIS — J9601 Acute respiratory failure with hypoxia: Secondary | ICD-10-CM | POA: Diagnosis not present

## 2019-12-14 DIAGNOSIS — J441 Chronic obstructive pulmonary disease with (acute) exacerbation: Principal | ICD-10-CM

## 2019-12-14 LAB — BASIC METABOLIC PANEL
Anion gap: 8 (ref 5–15)
BUN: 30 mg/dL — ABNORMAL HIGH (ref 8–23)
CO2: 38 mmol/L — ABNORMAL HIGH (ref 22–32)
Calcium: 9 mg/dL (ref 8.9–10.3)
Chloride: 93 mmol/L — ABNORMAL LOW (ref 98–111)
Creatinine, Ser: 1.05 mg/dL (ref 0.61–1.24)
GFR, Estimated: >60 mL/min (ref >60)
Glucose, Bld: 87 mg/dL (ref 70–99)
Potassium: 4.4 mmol/L (ref 3.5–5.1)
Sodium: 139 mmol/L (ref 135–145)

## 2019-12-14 LAB — GLUCOSE, CAPILLARY
Glucose-Capillary: 161 mg/dL — ABNORMAL HIGH (ref 70–99)
Glucose-Capillary: 256 mg/dL — ABNORMAL HIGH (ref 70–99)

## 2019-12-14 LAB — CBG MONITORING, ED
Glucose-Capillary: 239 mg/dL — ABNORMAL HIGH (ref 70–99)
Glucose-Capillary: 158 mg/dL — ABNORMAL HIGH (ref 70–99)
Glucose-Capillary: 188 mg/dL — ABNORMAL HIGH (ref 70–99)
Glucose-Capillary: 275 mg/dL — ABNORMAL HIGH (ref 70–99)

## 2019-12-14 LAB — PHOSPHORUS: Phosphorus: 2.3 mg/dL — ABNORMAL LOW (ref 2.5–4.6)

## 2019-12-14 LAB — MAGNESIUM: Magnesium: 2.4 mg/dL (ref 1.7–2.4)

## 2019-12-14 LAB — PROCALCITONIN: Procalcitonin: 0.16 ng/mL

## 2019-12-14 MED ORDER — CHLORHEXIDINE GLUCONATE CLOTH 2 % EX PADS
6.0000 | MEDICATED_PAD | Freq: Every day | CUTANEOUS | Status: DC
  Administered 2019-12-14: 6 via TOPICAL

## 2019-12-14 MED ORDER — LAMOTRIGINE 100 MG PO TABS
100.0000 mg | ORAL_TABLET | Freq: Every day | ORAL | Status: DC
  Administered 2019-12-14 – 2019-12-15 (×2): 100 mg via ORAL
  Filled 2019-12-14 (×2): qty 1

## 2019-12-14 MED ORDER — SENNOSIDES-DOCUSATE SODIUM 8.6-50 MG PO TABS
1.0000 | ORAL_TABLET | Freq: Every day | ORAL | Status: DC
  Administered 2019-12-15: 1 via ORAL
  Filled 2019-12-14 (×2): qty 1

## 2019-12-14 MED ORDER — DULOXETINE HCL 30 MG PO CPEP
60.0000 mg | ORAL_CAPSULE | Freq: Every day | ORAL | Status: DC
  Administered 2019-12-14 – 2019-12-15 (×2): 60 mg via ORAL
  Filled 2019-12-14 (×2): qty 2

## 2019-12-14 MED ORDER — ALPRAZOLAM 0.5 MG PO TABS
0.2500 mg | ORAL_TABLET | Freq: Two times a day (BID) | ORAL | Status: DC | PRN
  Administered 2019-12-14 – 2019-12-15 (×3): 0.25 mg via ORAL
  Filled 2019-12-14 (×3): qty 1

## 2019-12-14 MED ORDER — OXYCODONE HCL 5 MG PO TABS
10.0000 mg | ORAL_TABLET | ORAL | Status: DC | PRN
  Administered 2019-12-14 – 2019-12-15 (×5): 10 mg via ORAL
  Filled 2019-12-14 (×4): qty 2

## 2019-12-14 MED ORDER — BACLOFEN 10 MG PO TABS
20.0000 mg | ORAL_TABLET | Freq: Three times a day (TID) | ORAL | Status: DC
  Administered 2019-12-14 – 2019-12-15 (×2): 20 mg via ORAL
  Filled 2019-12-14 (×4): qty 2

## 2019-12-14 MED ORDER — BISOPROLOL FUMARATE 5 MG PO TABS
2.5000 mg | ORAL_TABLET | Freq: Every day | ORAL | Status: DC
  Administered 2019-12-14 – 2019-12-15 (×2): 2.5 mg via ORAL
  Filled 2019-12-14 (×2): qty 0.5

## 2019-12-14 MED ORDER — LACTULOSE 10 GM/15ML PO SOLN
20.0000 g | Freq: Two times a day (BID) | ORAL | Status: DC
  Filled 2019-12-14 (×2): qty 30

## 2019-12-14 MED ORDER — ONDANSETRON HCL 4 MG/2ML IJ SOLN
4.0000 mg | Freq: Four times a day (QID) | INTRAMUSCULAR | Status: DC | PRN
  Administered 2019-12-14 – 2019-12-15 (×3): 4 mg via INTRAVENOUS
  Filled 2019-12-14 (×3): qty 2

## 2019-12-14 NOTE — ED Notes (Signed)
Admitting MD made aware patient also requesting his Lyrica for pain.

## 2019-12-14 NOTE — ED Notes (Signed)
Admitting MD at bedside.

## 2019-12-14 NOTE — ED Notes (Signed)
Medication administered per order. Pt tolerated well. Pt denies further needs. Call bell remains within reach of patient at this time.

## 2019-12-14 NOTE — Progress Notes (Signed)
PROGRESS NOTE    Gregory Hall   YQI:[id number]  DOB: 11-27-1955  PCP: Enid Baas, MD    DOA: 12/13/2019 LOS: 1   Brief Narrative   No notes on file    Assessment & Plan   Active Problems:   COPD with acute exacerbation (HCC)   Acute respiratory failure (HCC)   Acute on chronic respiratory failure with hypoxia and hypercarbia secondary to acute exacerbation COPD Initially admitted to [facility] service on BiPAP. Baseline O2 requirement 3 L/min, initially on BiPAP and 24 L Patient has stage IV severe COPD, FEV1 27% of predicted,  FEV1/FVC was 35%. 12/5: Has diffuse wheezing on exam. --Continue IV steroids --Continue bronchodilators and inhaled corticosteroid --BiPAP overnight and as needed during the day --Continue Zithromax --Supplemental oxygen as needed anti-infectious aspirin 80%   Acute kidney injury -present on admission, due to acute urinary retention-obstructive uropathy.  Foley placed. Lasix held  Acute urinary retention -patient reportedly was unable to void at ALF prior to admission.  Currently with Foley which was placed on arrival, reassess need.  --Strict I/O's --Resume Flomax --Consider urology consult.  Type 2 diabetes -sliding scale NovoLog.  Continued on home Lantus.  Hyperlipidemia -continue statin  Atrial fibrillation/flutter -currently normal sinus.  Continued on home amiodarone.  Mood disorder -continue to Cymbalta Lamictal  Chronic pain -follows the pain clinic and on oxycodone every 8 hours in addition to Flexeril Lyrica baclofen. --Home oxy has been resumed with PRN's for breakthrough  --Continue Baclofen --Lyrica on hold to avoid oversedation and respiratory depression in setting of COPD exacerbation  Tobacco abuse -nicotine patch ordered   .   DVT prophylaxis: enoxaparin (LOVENOX) injection 40 mg Start: 12/13/19 1000 SCDs Start: 12/13/19 0511   Diet:  Diet Orders (From admission, onward)    Start     Ordered    12/13/19 2327  Diet regular Room service appropriate? Yes; Fluid consistency: Thin  Diet effective now       Question Answer Comment  Room service appropriate? Yes   Fluid consistency: Thin      12/13/19 2326            Code Status: Full Code    Subjective 12/14/19    Patient seen this morning in ED on home program.  Reports feeling better.  Reports production of yellowish phlegm with coughing.  Asked to have the frequency of his pain medication increased, stating he has had multiple back surgeries.  Laying in bed exacerbates his pain.  Earlier this morning, RN reported O2 sat in the 70s when patient had remove nasal cannula.  Confirms he uses 3 L/min oxygen at baseline.   Disposition Plan & Communication   Status is: Inpatient  Inpatient status remains appropriate due to severity of illness requiring IV therapies.  Dispo: The patient is from: Home              Anticipated d/c is to: Home              Anticipated d/c date is: 2 days              Patient currently is not medically stable for discharge   Family Communication: none at bedside, will attempt call   Consults, Procedures, Significant Events   Consultants:   Initially admitted to PCCM service  Procedures:   none  Antimicrobials:  Anti-infectives (From admission, onward)   Start     Dose/Rate Route Frequency Ordered Stop   12/13/19 0800  azithromycin (ZITHROMAX) 500  mg in sodium chloride 0.9 % 250 mL IVPB        500 mg 250 mL/hr over 60 Minutes Intravenous Every 24 hours 12/13/19 0649 12/16/19 0759   12/13/19 0145  ceFEPIme (MAXIPIME) 2 g in sodium chloride 0.9 % 100 mL IVPB        2 g 200 mL/hr over 30 Minutes Intravenous  Once 12/13/19 0133 12/13/19 0249        Objective   Vitals:   12/14/19 1251 12/14/19 1300 12/14/19 1400 12/14/19 1512  BP: 135/73 132/70 112/73 (!) 147/75  Pulse: 89 85 90 84  Resp: 14 13 14 12   Temp: 98.3 F (36.8 C)   98.5 F (36.9 C)  TempSrc: Oral   Oral  SpO2: 92%  94% 93% 98%  Weight:      Height:        Intake/Output Summary (Last 24 hours) at 12/14/2019 2011 Last data filed at 12/14/2019 14/05/2019 Gross per 24 hour  Intake 250 ml  Output 2000 ml  Net -1750 ml   Filed Weights   12/13/19 0104  Weight: 88 kg    Physical Exam:   General exam: awake, alert, no acute distress Respiratory system: diminished aeration, diffuse wheezes  or rhonchi, normal respiratory effort. On 4 L/min O2 nasal cannula Cardiovascular system: normal S1/S2, RRR, no JVD, murmurs, rubs, gallops Gastrointestinal system: soft, NT, ND, no HSM felt, +bowel sounds. Central nervous system: A&O x4. no gross focal neurologic deficits, normal speech Extremities: moves all, trace edema, normal tone   Labs   Data Reviewed: I have personally reviewed following labs and imaging studies  CBC: Recent Labs  Lab 12/13/19 0042  WBC 6.7  NEUTROABS 5.0  HGB 10.9*  HCT 38.1*  MCV 86.8  PLT 215   Basic Metabolic Panel: Recent Labs  Lab 12/13/19 0042 12/13/19 0542 12/13/19 2103 12/14/19 0454  NA 134* 135 138 139  K 6.5* 5.5* 4.5 4.4  CL 88* 88* 90* 93*  CO2 32 37* 35* 38*  GLUCOSE 164* 189* 174* 87  BUN 27* 31* 29* 30*  CREATININE 1.93* 1.66* 1.27* 1.05  CALCIUM 9.0 8.7* 8.9 9.0  MG 2.4 2.5*  --  2.4  PHOS  --  5.8* 2.9 2.3*   GFR: Estimated Creatinine Clearance: 76 mL/min (by C-G formula based on SCr of 1.05 mg/dL). Liver Function Tests: Recent Labs  Lab 12/13/19 0042 12/13/19 2103  AST 31  --   ALT 18  --   ALKPHOS 110  --   BILITOT 1.0  --   PROT 7.3  --   ALBUMIN 3.8 3.6   No results for input(s): LIPASE, AMYLASE in the last 168 hours. No results for input(s): AMMONIA in the last 168 hours. Coagulation Profile: No results for input(s): INR, PROTIME in the last 168 hours. Cardiac Enzymes: No results for input(s): CKTOTAL, CKMB, CKMBINDEX, TROPONINI in the last 168 hours. BNP (last 3 results) No results for input(s): PROBNP in the last 8760  hours. HbA1C: No results for input(s): HGBA1C in the last 72 hours. CBG: Recent Labs  Lab 12/14/19 0201 12/14/19 0323 12/14/19 0745 12/14/19 1201 12/14/19 1809  GLUCAP 239* 158* 188* 275* 161*   Lipid Profile: No results for input(s): CHOL, HDL, LDLCALC, TRIG, CHOLHDL, LDLDIRECT in the last 72 hours. Thyroid Function Tests: No results for input(s): TSH, T4TOTAL, FREET4, T3FREE, THYROIDAB in the last 72 hours. Anemia Panel: No results for input(s): VITAMINB12, FOLATE, FERRITIN, TIBC, IRON, RETICCTPCT in the last 72 hours. Sepsis  Labs: Recent Labs  Lab 12/13/19 0042 12/13/19 0542 12/14/19 0454  PROCALCITON  --  0.19 0.16  LATICACIDVEN 2.3* 1.6  --     Recent Results (from the past 240 hour(s))  Blood culture (single)     Status: None (Preliminary result)   Collection Time: 12/13/19 12:42 AM   Specimen: BLOOD  Result Value Ref Range Status   Specimen Description BLOOD LEFT ASSIST CONTROL  Final   Special Requests   Final    BOTTLES DRAWN AEROBIC AND ANAEROBIC Blood Culture adequate volume   Culture   Final    NO GROWTH 1 DAY Performed at [facility], [address] [place], [place] [address]    Report Status PENDING  Incomplete  Resp Panel by RT-PCR (Flu A&B, Covid) Nasopharyngeal Swab     Status: None   Collection Time: 12/13/19 12:42 AM   Specimen: Nasopharyngeal Swab; Nasopharyngeal(NP) swabs in vial transport medium  Result Value Ref Range Status   SARS Coronavirus 2 by RT PCR NEGATIVE NEGATIVE Final    Comment: (NOTE) SARS-CoV-2 target nucleic acids are NOT DETECTED.  The SARS-CoV-2 RNA is generally detectable in upper respiratory specimens during the acute phase of infection. The lowest concentration of SARS-CoV-2 viral copies this assay can detect is 138 copies/mL. A negative result does not preclude SARS-Cov-2 infection and should not be used as the sole basis for treatment or other patient management decisions. A negative result may occur with   improper specimen collection/handling, submission of specimen other than nasopharyngeal swab, presence of viral mutation(s) within the areas targeted by this assay, and inadequate number of viral copies(<138 copies/mL). A negative result must be combined with clinical observations, patient history, and epidemiological information. The expected result is Negative.  Fact Sheet for Patients:  BloggerCourse.com  Fact Sheet for Healthcare Providers:  SeriousBroker.it  This test is no t yet approved or cleared by the [place] FDA and  has been authorized for detection and/or diagnosis of SARS-CoV-2 by FDA under an Emergency Use Authorization (EUA). This EUA will remain  in effect (meaning this test can be used) for the duration of the COVID-19 declaration under Section 564(b)(1) of the Act, 21 U.S.C.section 360bbb-3(b)(1), unless the authorization is terminated  or revoked sooner.       Influenza A by PCR NEGATIVE NEGATIVE Final   Influenza B by PCR NEGATIVE NEGATIVE Final    Comment: (NOTE) The Xpert Xpress SARS-CoV-2/FLU/RSV plus assay is intended as an aid in the diagnosis of influenza from Nasopharyngeal swab specimens and should not be used as a sole basis for treatment. Nasal washings and aspirates are unacceptable for Xpert Xpress SARS-CoV-2/FLU/RSV testing.  Fact Sheet for Patients: BloggerCourse.com  Fact Sheet for Healthcare Providers: SeriousBroker.it  This test is not yet approved or cleared by the [place] FDA and has been authorized for detection and/or diagnosis of SARS-CoV-2 by FDA under an Emergency Use Authorization (EUA). This EUA will remain in effect (meaning this test can be used) for the duration of the COVID-19 declaration under Section 564(b)(1) of the Act, 21 U.S.C. section 360bbb-3(b)(1), unless the authorization is terminated  or revoked.  Performed at [facility], [address] [place], [place] [address]       Imaging Studies   DG Chest Portable 1 View  Result Date: 12/13/2019 CLINICAL DATA:  COPD EXAM: PORTABLE CHEST 1 VIEW COMPARISON:  10/24/2019 FINDINGS: Mild opacity at the left lung base. Endotracheal tube is no longer present. No focal consolidation. IMPRESSION: Mild  opacity at the left lung base, likely atelectasis. Electronically Signed   By: Deatra Robinson M.D.   On: 12/13/2019 01:00     Medications   Scheduled Meds: . amiodarone  200 mg Oral Daily  . atorvastatin  40 mg Oral Daily  . budesonide (PULMICORT) nebulizer solution  0.25 mg Nebulization BID  . Chlorhexidine Gluconate Cloth  6 each Topical Daily  . enoxaparin (LOVENOX) injection  40 mg Subcutaneous Q24H  . fluticasone  1 spray Each Nare BID  . insulin aspart  0-15 Units Subcutaneous Q4H  . insulin glargine  22 Units Subcutaneous QHS  . ipratropium-albuterol  3 mL Nebulization Q6H  . methylPREDNISolone (SOLU-MEDROL) injection  40 mg Intravenous Q12H  . nicotine  21 mg Transdermal Daily  . oxyCODONE  10 mg Oral Q8H  . pantoprazole (PROTONIX) IV  40 mg Intravenous Q24H  . tamsulosin  0.4 mg Oral QPC supper   Continuous Infusions: . azithromycin Stopped (12/14/19 0906)       LOS: 1 day    Time spent: 30 minutes    Pennie Banter, DO [facility]  12/14/2019, 8:11 PM    If 7PM-7AM, please contact night-coverage. How to contact the [facility] Attending or Consulting provider 7A - 7P or covering provider during after hours 7P -7A, for this patient?    1. Check the care team in [facility] and look for a) attending/consulting [facility] provider listed and b) the [facility] team listed 2. Log into [email or web address] and use [employer]'s universal password to access. If you do not have the password, please contact the hospital operator. 3. Locate the [facility] provider you are looking for under [facility] and page to a number that  you can be directly reached. 4. If you still have difficulty reaching the provider, please page the [facility] (Director on Call) for the Hospitalists listed on amion for assistance.

## 2019-12-14 NOTE — ED Notes (Signed)
Pt assisted to the bedside commode at this time to have a bowel movement. Pt tolerated well. Pt back to bed without incident.

## 2019-12-14 NOTE — Plan of Care (Signed)
Continuing with plan of care. 

## 2019-12-14 NOTE — ED Notes (Signed)
Pt resting in bed with NAD noted, lights remain dimmed for patient comfort. Pt requesting this RN contact admitting MD and requesting "something for [his nerves]". Admitting MD made aware.

## 2019-12-14 NOTE — ED Notes (Signed)
Advised nurse that patients has assigned bed

## 2019-12-14 NOTE — ED Notes (Signed)
Pt ate approx 10% of breakfast tray. Pt repositioned in bed for comfort. Pt given phone to contact/update his family. Pt denies further needs at this time.

## 2019-12-14 NOTE — Progress Notes (Signed)
Patient seen for svn tx. Patient reports on o2 3 liters at home. Noted to be on 5 titrated him to 4 after svn. Sat goals 88-92 per charting. We talked about bipap at night and he immediately got upset and said was too much for him tolerate. We discussed the events of what happened prior to bipap and the effects of the therapy and what he had been told. He clearly understands the benefits of why bipap is important. So I left him to ponder our conversation with promise to return later to discuss a solution to moving forward.

## 2019-12-14 NOTE — ED Notes (Signed)
This RN to bedside, medications administered per order. Pt tolerated well. Pt currently resting in bed doing breathing treatment, explained will give patient a phone when breathing treatment complete. Pt denies further needs at this time.

## 2019-12-15 ENCOUNTER — Ambulatory Visit: Payer: Medicare Other | Admitting: Physician Assistant

## 2019-12-15 DIAGNOSIS — J9601 Acute respiratory failure with hypoxia: Secondary | ICD-10-CM | POA: Diagnosis not present

## 2019-12-15 DIAGNOSIS — J9602 Acute respiratory failure with hypercapnia: Secondary | ICD-10-CM | POA: Diagnosis not present

## 2019-12-15 DIAGNOSIS — J441 Chronic obstructive pulmonary disease with (acute) exacerbation: Secondary | ICD-10-CM | POA: Diagnosis not present

## 2019-12-15 LAB — PHOSPHORUS: Phosphorus: 3.3 mg/dL (ref 2.5–4.6)

## 2019-12-15 LAB — BASIC METABOLIC PANEL
Anion gap: 8 (ref 5–15)
BUN: 26 mg/dL — ABNORMAL HIGH (ref 8–23)
CO2: 38 mmol/L — ABNORMAL HIGH (ref 22–32)
Calcium: 9.1 mg/dL (ref 8.9–10.3)
Chloride: 93 mmol/L — ABNORMAL LOW (ref 98–111)
Creatinine, Ser: 0.95 mg/dL (ref 0.61–1.24)
GFR, Estimated: >60 mL/min (ref >60)
Glucose, Bld: 157 mg/dL — ABNORMAL HIGH (ref 70–99)
Potassium: 4.9 mmol/L (ref 3.5–5.1)
Sodium: 139 mmol/L (ref 135–145)

## 2019-12-15 LAB — GLUCOSE, CAPILLARY
Glucose-Capillary: 159 mg/dL — ABNORMAL HIGH (ref 70–99)
Glucose-Capillary: 211 mg/dL — ABNORMAL HIGH (ref 70–99)
Glucose-Capillary: 229 mg/dL — ABNORMAL HIGH (ref 70–99)
Glucose-Capillary: 232 mg/dL — ABNORMAL HIGH (ref 70–99)

## 2019-12-15 LAB — PROCALCITONIN: Procalcitonin: 0.18 ng/mL

## 2019-12-15 LAB — MAGNESIUM: Magnesium: 2.2 mg/dL (ref 1.7–2.4)

## 2019-12-15 MED ORDER — AZITHROMYCIN 250 MG PO TABS: ORAL_TABLET | ORAL | 0 refills | Status: DC

## 2019-12-15 MED ORDER — SALINE SPRAY 0.65 % NA SOLN: 1.0000 | NASAL | 3 refills | Status: DC | PRN

## 2019-12-15 MED ORDER — PREDNISONE 10 MG PO TABS: ORAL_TABLET | ORAL | 0 refills | Status: AC

## 2019-12-15 MED ORDER — MIDODRINE HCL 10 MG PO TABS
10.0000 mg | ORAL_TABLET | Freq: Three times a day (TID) | ORAL | 3 refills | Status: DC
Start: 2019-12-15 — End: 2020-02-16

## 2019-12-15 MED ORDER — FLUTICASONE PROPIONATE 50 MCG/ACT NA SUSP
1.0000 | Freq: Two times a day (BID) | NASAL | 2 refills | Status: DC
Start: 2019-12-15 — End: 2020-05-13

## 2019-12-15 NOTE — Discharge Summary (Signed)
Physician Discharge Summary  Gregory Hall [facility] WGN:[id number] DOB: [birth year] DOA: 12/13/2019  PCP: Enid Baas, MD  Admit date: 12/13/2019 Discharge date: 12/15/2019  Admitted From: group home Disposition:  Group home  Recommendations for Outpatient Follow-up:  1. Follow up with PCP in 1-2 weeks 2. Please obtain BMP/CBC in one week 3. Please follow up with pulmonology as previously scheduled   Home Health: No  Equipment/Devices: None   Discharge Condition: Stable  CODE STATUS: Full  Diet recommendation: Regular   Discharge Diagnoses: Active Problems:   COPD with acute exacerbation (HCC)   Acute respiratory failure (HCC)    Summary of HPI and Hospital Course:   HPI on admission by PCCM: "64 year old male who presents to the ED from his local group home with shortness of breath and the inability to urinate.  He normally wears 3 L chronically nasal cannula at home. Upon arrival to the ED patient was altered and hypoxic with SPO2 readings in the 80s while on 10 L nasal cannula.  Patient was placed on BiPAP and initial ABG showed hypercarbia with a pH: 7.23 & PCO2: 101.  After 4 hours on BiPAP repeat ABG pH: 7.25 & PCO2: 85. PCCM consulted for ongoing BIPAP management and Stepdown admission."  Patient was able to transitioned off Bipap quickly.  He requires it overnight but declines to use it, says he cannot tolerate it, although he knows it helps him.    He was treated with IV steroids, Zithromax and bronchodilators.  He was weaned to his baseline oxygen requirement of 3 L/min, and overall at baseline respiratory status.    He is clinically improved and medically stable for discharge today.  Sent with 2 additional days of Zithromax, and prednisone taper.  He states he has pulmonology appointment coming up in a day or two.     Discharge Instructions   Discharge Instructions    Call MD for:   Complete by: As directed    Increasing need for more oxygen or worsening  shortness of breath or wheezing more than your normal.   Call MD for:  extreme fatigue   Complete by: As directed    Call MD for:  persistant dizziness or light-headedness   Complete by: As directed    Call MD for:  severe uncontrolled pain   Complete by: As directed    Call MD for:  temperature >100.4   Complete by: As directed    Increase activity slowly   Complete by: As directed      Allergies as of 12/15/2019      Reactions   Acetaminophen Other (See Comments)   Reaction:  Unknown  Kidney failure Told not to take from home M.D. Related to kidney and renal failure   Fentanyl    Nsaids Other (See Comments)   Reaction:  Unknown  Kidney failure Patient states not to take from home M.D. Related to kidney and renal failure. Other reaction(s): Unknown      Medication List    TAKE these medications   amiodarone 200 MG tablet Commonly known as: PACERONE Take 1 tablet (200 mg total) by mouth daily.   atorvastatin 40 MG tablet Commonly known as: LIPITOR Take 1 tablet (40 mg total) by mouth daily.   azithromycin 250 MG tablet Commonly known as: Zithromax Take 250 mg (1 tablet) by mouth daily for 2 days.   baclofen 20 MG tablet Commonly known as: LIORESAL Take 20 mg by mouth 3 (three) times daily.   bisoprolol 5 MG  tablet Commonly known as: ZEBETA Take 2.5 mg by mouth daily.   budesonide 0.5 MG/2ML nebulizer solution Commonly known as: PULMICORT Take 0.5 mg by nebulization 2 (two) times daily.   cyclobenzaprine 5 MG tablet Commonly known as: FLEXERIL Take 1 tablet (5 mg total) by mouth 3 (three) times daily as needed. What changed: reasons to take this   DULoxetine 60 MG capsule Commonly known as: CYMBALTA Take 60 mg by mouth daily.   Ensure Max Protein Liqd Take 330 mLs (11 oz total) by mouth 2 (two) times daily between meals.   fluticasone 50 MCG/ACT nasal spray Commonly known as: FLONASE Place 1 spray into both nostrils 2 (two) times daily.   insulin  glargine 100 UNIT/ML injection Commonly known as: LANTUS Inject 11 Units into the skin at bedtime.   insulin lispro 100 UNIT/ML injection Commonly known as: HUMALOG Inject 4 Units into the skin See admin instructions. Inject 4 units with blood sugar 150-250; inject 6 units 251-350; greater than 351 call provider; Hold for FSBG <150   ipratropium-albuterol 0.5-2.5 (3) MG/3ML Soln Commonly known as: DUONEB Take 3 mLs by nebulization every 6 (six) hours as needed. What changed: reasons to take this   lactulose 10 GM/15ML solution Commonly known as: CHRONULAC Take 20 g by mouth 2 (two) times daily.   lamoTRIgine 100 MG tablet Commonly known as: LAMICTAL Take 100 mg by mouth daily.   magnesium oxide 400 MG tablet Commonly known as: MAG-OX Take 400 mg by mouth daily.   midodrine 10 MG tablet Commonly known as: PROAMATINE Take 1 tablet (10 mg total) by mouth every 8 (eight) hours. Hold if systolic BP>120 What changed: additional instructions   NAC 600 MG Caps Generic drug: Acetylcysteine Take 600 mg by mouth daily.   nicotine 21 mg/24hr patch Commonly known as: NICODERM CQ - dosed in mg/24 hours Place 21 mg onto the skin daily.   nicotine polacrilex 2 MG lozenge Commonly known as: COMMIT Take 2 mg by mouth every 6 (six) hours as needed for smoking cessation.   Nitrostat 0.4 MG SL tablet Generic drug: nitroGLYCERIN DISSOLVE (1) TABLET UNDER TONGUE AS NEEDED TO RELIEVE CHEST PAIN. MAYREPEAT EVERY 5 MINUTES. What changed: See the new instructions.   Oxycodone HCl 10 MG Tabs Take 1 tablet (10 mg total) by mouth 3 (three) times daily. What changed: when to take this   oxymetazoline 0.05 % nasal spray Commonly known as: AFRIN Place 2 sprays into both nostrils 2 (two) times daily as needed for congestion.   pantoprazole 40 MG tablet Commonly known as: PROTONIX Take 40 mg by mouth daily.   predniSONE 10 MG tablet Commonly known as: DELTASONE Take 4 tablets (40 mg total)  by mouth daily for 2 days, THEN 3 tablets (30 mg total) daily for 2 days, THEN 2 tablets (20 mg total) daily for 2 days, THEN 1 tablet (10 mg total) daily for 2 days, THEN 0.5 tablets (5 mg total) daily for 2 days. Resume daily 2.5 mg by mouth daily after taper.. Start taking on: December 15, 2019 What changed:   medication strength  See the new instructions.   pregabalin 200 MG capsule Commonly known as: LYRICA Take 1 capsule (200 mg total) by mouth 3 (three) times daily.   rivaroxaban 20 MG Tabs tablet Commonly known as: XARELTO Take 20 mg by mouth daily with supper.   roflumilast 500 MCG Tabs tablet Commonly known as: DALIRESP Take 250 mcg by mouth daily.   SALONPAS PAIN RELIEF  PATCH EX Place 1 patch onto the skin every 12 (twelve) hours as needed (aches and pains).   senna-docusate 8.6-50 MG tablet Commonly known as: Senokot-S Take 1 tablet by mouth daily.   sodium chloride 0.65 % Soln nasal spray Commonly known as: OCEAN Place 1 spray into both nostrils as needed for congestion.   tamsulosin 0.4 MG Caps capsule Commonly known as: FLOMAX Take 1 capsule (0.4 mg total) by mouth daily after supper.   traZODone 100 MG tablet Commonly known as: DESYREL Take 100 mg by mouth at bedtime.   Trelegy Ellipta 100-62.5-25 MCG/INH Aepb Generic drug: Fluticasone-Umeclidin-Vilant Inhale 1 puff into the lungs daily.   varenicline 1 MG tablet Commonly known as: CHANTIX Take 1 mg by mouth 2 (two) times daily.   Vitamin D3 50 MCG (2000 UT) Tabs Take 2,000 Units by mouth daily.       Allergies  Allergen Reactions  . Acetaminophen Other (See Comments)    Reaction:  Unknown  Kidney failure Told not to take from home M.D. Related to kidney and renal failure   . Fentanyl   . Nsaids Other (See Comments)    Reaction:  Unknown  Kidney failure Patient states not to take from home M.D. Related to kidney and renal failure.  Other reaction(s): Unknown    Consultations:  PCCM     Procedures/Studies: DG Chest Portable 1 View  Result Date: 12/13/2019 CLINICAL DATA:  COPD EXAM: PORTABLE CHEST 1 VIEW COMPARISON:  10/24/2019 FINDINGS: Mild opacity at the left lung base. Endotracheal tube is no longer present. No focal consolidation. IMPRESSION: Mild opacity at the left lung base, likely atelectasis. Electronically Signed   By: Deatra Robinson M.D.   On: 12/13/2019 01:00       Subjective: Pt seen up in chair.  Did not wear bipap overnight, says does not tolerate it but knows it helps him.  Denies SOB beyond baseline.  On his usual 3 L/min.  Feels well enough for discharge home and says has pulmonology appt tomorrow or next day.   Discharge Exam: Vitals:   12/15/19 0354 12/15/19 0818  BP: (!) 150/66 (!) 153/74  Pulse: 76 83  Resp: 20   Temp: 98 F (36.7 C) 98.2 F (36.8 C)  SpO2: 91% 92%   Vitals:   12/14/19 2333 12/15/19 0248 12/15/19 0354 12/15/19 0818  BP: (!) 141/61  (!) 150/66 (!) 153/74  Pulse: 78  76 83  Resp: 18  20   Temp: 98 F (36.7 C)  98 F (36.7 C) 98.2 F (36.8 C)  TempSrc: Oral  Oral Oral  SpO2: 96% 93% 91% 92%  Weight:      Height:        General: Pt is alert, awake, not in acute distress Cardiovascular: RRR, S1/S2 +, no rubs, no gallops Respiratory: poor aeration, no wheezing, no rhonchi, normal work of breathing, on 3 L/min oxygen Abdominal: Soft, NT, ND, bowel sounds + Extremities: no edema, no cyanosis    The results of significant diagnostics from this hospitalization (including imaging, microbiology, ancillary and laboratory) are listed below for reference.     Microbiology: Recent Results (from the past 240 hour(s))  Blood culture (single)     Status: None (Preliminary result)   Collection Time: 12/13/19 12:42 AM   Specimen: BLOOD  Result Value Ref Range Status   Specimen Description BLOOD LEFT ASSIST CONTROL  Final   Special Requests   Final    BOTTLES DRAWN AEROBIC AND ANAEROBIC Blood Culture adequate  volume    Culture   Final    NO GROWTH 2 DAYS Performed at [facility], [address] [place], [place] [address]    Report Status PENDING  Incomplete  Resp Panel by RT-PCR (Flu A&B, Covid) Nasopharyngeal Swab     Status: None   Collection Time: 12/13/19 12:42 AM   Specimen: Nasopharyngeal Swab; Nasopharyngeal(NP) swabs in vial transport medium  Result Value Ref Range Status   SARS Coronavirus 2 by RT PCR NEGATIVE NEGATIVE Final    Comment: (NOTE) SARS-CoV-2 target nucleic acids are NOT DETECTED.  The SARS-CoV-2 RNA is generally detectable in upper respiratory specimens during the acute phase of infection. The lowest concentration of SARS-CoV-2 viral copies this assay can detect is 138 copies/mL. A negative result does not preclude SARS-Cov-2 infection and should not be used as the sole basis for treatment or other patient management decisions. A negative result may occur with  improper specimen collection/handling, submission of specimen other than nasopharyngeal swab, presence of viral mutation(s) within the areas targeted by this assay, and inadequate number of viral copies(<138 copies/mL). A negative result must be combined with clinical observations, patient history, and epidemiological information. The expected result is Negative.  Fact Sheet for Patients:  BloggerCourse.com  Fact Sheet for Healthcare Providers:  SeriousBroker.it  This test is no t yet approved or cleared by the [place] FDA and  has been authorized for detection and/or diagnosis of SARS-CoV-2 by FDA under an Emergency Use Authorization (EUA). This EUA will remain  in effect (meaning this test can be used) for the duration of the COVID-19 declaration under Section 564(b)(1) of the Act, 21 U.S.C.section 360bbb-3(b)(1), unless the authorization is terminated  or revoked sooner.       Influenza A by PCR NEGATIVE NEGATIVE Final   Influenza B by PCR  NEGATIVE NEGATIVE Final    Comment: (NOTE) The Xpert Xpress SARS-CoV-2/FLU/RSV plus assay is intended as an aid in the diagnosis of influenza from Nasopharyngeal swab specimens and should not be used as a sole basis for treatment. Nasal washings and aspirates are unacceptable for Xpert Xpress SARS-CoV-2/FLU/RSV testing.  Fact Sheet for Patients: BloggerCourse.com  Fact Sheet for Healthcare Providers: SeriousBroker.it  This test is not yet approved or cleared by the [place] FDA and has been authorized for detection and/or diagnosis of SARS-CoV-2 by FDA under an Emergency Use Authorization (EUA). This EUA will remain in effect (meaning this test can be used) for the duration of the COVID-19 declaration under Section 564(b)(1) of the Act, 21 U.S.C. section 360bbb-3(b)(1), unless the authorization is terminated or revoked.  Performed at [facility], [address] [place], [place] [address]      Labs: BNP (last 3 results) Recent Labs    10/10/19 0622 10/22/19 2031 12/13/19 0042  BNP 276.0* 198.3* 620.6*   Basic Metabolic Panel: Recent Labs  Lab 12/13/19 0042 12/13/19 0542 12/13/19 2103 12/14/19 0454 12/15/19 0516  NA 134* 135 138 139 139  K 6.5* 5.5* 4.5 4.4 4.9  CL 88* 88* 90* 93* 93*  CO2 32 37* 35* 38* 38*  GLUCOSE 164* 189* 174* 87 157*  BUN 27* 31* 29* 30* 26*  CREATININE 1.93* 1.66* 1.27* 1.05 0.95  CALCIUM 9.0 8.7* 8.9 9.0 9.1  MG 2.4 2.5*  --  2.4 2.2  PHOS  --  5.8* 2.9 2.3* 3.3   Liver Function Tests: Recent Labs  Lab 12/13/19 0042 12/13/19 2103  AST 31  --   ALT 18  --  ALKPHOS 110  --   BILITOT 1.0  --   PROT 7.3  --   ALBUMIN 3.8 3.6   No results for input(s): LIPASE, AMYLASE in the last 168 hours. No results for input(s): AMMONIA in the last 168 hours. CBC: Recent Labs  Lab 12/13/19 0042  WBC 6.7  NEUTROABS 5.0  HGB 10.9*  HCT 38.1*  MCV 86.8  PLT 215   Cardiac  Enzymes: No results for input(s): CKTOTAL, CKMB, CKMBINDEX, TROPONINI in the last 168 hours. BNP: Invalid input(s): POCBNP CBG: Recent Labs  Lab 12/14/19 1809 12/14/19 2050 12/15/19 0006 12/15/19 0355 12/15/19 0815  GLUCAP 161* 256* 229* 159* 211*   D-Dimer No results for input(s): DDIMER in the last 72 hours. Hgb A1c No results for input(s): HGBA1C in the last 72 hours. Lipid Profile No results for input(s): CHOL, HDL, LDLCALC, TRIG, CHOLHDL, LDLDIRECT in the last 72 hours. Thyroid function studies No results for input(s): TSH, T4TOTAL, T3FREE, THYROIDAB in the last 72 hours.  Invalid input(s): FREET3 Anemia work up No results for input(s): VITAMINB12, FOLATE, FERRITIN, TIBC, IRON, RETICCTPCT in the last 72 hours. Urinalysis    Component Value Date/Time   COLORURINE YELLOW (A) 12/13/2019 0042   APPEARANCEUR CLEAR (A) 12/13/2019 0042   APPEARANCEUR Clear 10/28/2013 2215   LABSPEC 1.015 12/13/2019 0042   LABSPEC 1.002 10/28/2013 2215   PHURINE 5.0 12/13/2019 0042   GLUCOSEU NEGATIVE 12/13/2019 0042   GLUCOSEU Negative 10/28/2013 2215   HGBUR NEGATIVE 12/13/2019 0042   BILIRUBINUR NEGATIVE 12/13/2019 0042   BILIRUBINUR Negative 10/28/2013 2215   KETONESUR NEGATIVE 12/13/2019 0042   PROTEINUR NEGATIVE 12/13/2019 0042   UROBILINOGEN 1.0 03/29/2009 0840   NITRITE NEGATIVE 12/13/2019 0042   LEUKOCYTESUR NEGATIVE 12/13/2019 0042   LEUKOCYTESUR Negative 10/28/2013 2215   Sepsis Labs Invalid input(s): PROCALCITONIN,  WBC,  LACTICIDVEN Microbiology Recent Results (from the past 240 hour(s))  Blood culture (single)     Status: None (Preliminary result)   Collection Time: 12/13/19 12:42 AM   Specimen: BLOOD  Result Value Ref Range Status   Specimen Description BLOOD LEFT ASSIST CONTROL  Final   Special Requests   Final    BOTTLES DRAWN AEROBIC AND ANAEROBIC Blood Culture adequate volume   Culture   Final    NO GROWTH 2 DAYS Performed at [facility], [address], [place], [place] [address]    Report Status PENDING  Incomplete  Resp Panel by RT-PCR (Flu A&B, Covid) Nasopharyngeal Swab     Status: None   Collection Time: 12/13/19 12:42 AM   Specimen: Nasopharyngeal Swab; Nasopharyngeal(NP) swabs in vial transport medium  Result Value Ref Range Status   SARS Coronavirus 2 by RT PCR NEGATIVE NEGATIVE Final    Comment: (NOTE) SARS-CoV-2 target nucleic acids are NOT DETECTED.  The SARS-CoV-2 RNA is generally detectable in upper respiratory specimens during the acute phase of infection. The lowest concentration of SARS-CoV-2 viral copies this assay can detect is 138 copies/mL. A negative result does not preclude SARS-Cov-2 infection and should not be used as the sole basis for treatment or other patient management decisions. A negative result may occur with  improper specimen collection/handling, submission of specimen other than nasopharyngeal swab, presence of viral mutation(s) within the areas targeted by this assay, and inadequate number of viral copies(<138 copies/mL). A negative result must be combined with clinical observations, patient history, and epidemiological information. The expected result is Negative.  Fact Sheet for Patients:  BloggerCourse.com  Fact Sheet for Healthcare Providers:  SeriousBroker.it  This test is no t yet approved or cleared by the [place] and  has been authorized for detection and/or diagnosis of SARS-CoV-2 by FDA under an Emergency Use Authorization (EUA). This EUA will remain  in effect (meaning this test can be used) for the duration of the COVID-19 declaration under Section 564(b)(1) of the Act, 21 U.S.C.section 360bbb-3(b)(1), unless the authorization is terminated  or revoked sooner.       Influenza A by PCR NEGATIVE NEGATIVE Final   Influenza B by PCR NEGATIVE NEGATIVE Final    Comment: (NOTE) The Xpert Xpress SARS-CoV-2/FLU/RSV  plus assay is intended as an aid in the diagnosis of influenza from Nasopharyngeal swab specimens and should not be used as a sole basis for treatment. Nasal washings and aspirates are unacceptable for Xpert Xpress SARS-CoV-2/FLU/RSV testing.  Fact Sheet for Patients: BloggerCourse.com  Fact Sheet for Healthcare Providers: SeriousBroker.it  This test is not yet approved or cleared by the [place] FDA and has been authorized for detection and/or diagnosis of SARS-CoV-2 by FDA under an Emergency Use Authorization (EUA). This EUA will remain in effect (meaning this test can be used) for the duration of the COVID-19 declaration under Section 564(b)(1) of the Act, 21 U.S.C. section 360bbb-3(b)(1), unless the authorization is terminated or revoked.  Performed at [facility], [address] [place], [place] [address]      Time coordinating discharge: Over 30 minutes  SIGNED:   Pennie Banter, DO [facility] 12/15/2019, 10:54 AM   If 7PM-7AM, please contact night-coverage [email or web address]

## 2019-12-15 NOTE — Progress Notes (Signed)
Patient awake at this time, watching tv. Saturation noted at 93% no distress noted. States other that being hot he is fine. SVN given. bipap on sb

## 2019-12-15 NOTE — NC FL2 (Signed)
[place] MEDICAID FL2 LEVEL OF CARE SCREENING TOOL     IDENTIFICATION  Patient Name: Gregory Hall Birthdate: [birth year] Sex: male Admission Date (Current Location): 12/13/2019  [facility] and [place] Number:  Chiropodist and Address:         Provider Number: [id number]  Attending Physician Name and Address:  Pennie Banter, DO  Relative Name and Phone Number:       Current Level of Care: Hospital Recommended Level of Care: Family Care Home Prior Approval Number:    Date Approved/Denied:   PASRR Number:    Discharge Plan: Domiciliary (Rest home) [facility])    Current Diagnoses: Patient Active Problem List   Diagnosis Date Noted  . Acute respiratory failure (HCC) 12/13/2019  . Palliative care by specialist   . DNR (do not resuscitate)   . Acute CHF (congestive heart failure) (HCC) 10/08/2019  . Chronic respiratory failure with hypoxia (HCC) 10/08/2019  . Atrial flutter (HCC)   . Acute on chronic respiratory failure with hypoxia (HCC) 09/13/2019  . Acute diastolic CHF (congestive heart failure) (HCC) 09/12/2019  . NSTEMI (non-ST elevated myocardial infarction) (HCC) 09/12/2019  . Acute kidney failure, unspecified (HCC) 09/12/2019  . Diabetes mellitus type 2, uncontrolled, with complications (HCC) 06/10/2019  . Chronic pain syndrome 06/10/2019  . COPD exacerbation (HCC) 06/10/2019  . COPD with acute exacerbation (HCC) 06/09/2019  . CAD (coronary artery disease) 06/09/2019  . HTN (hypertension) 06/09/2019  . Acute on chronic respiratory failure with hypoxia and hypercapnia (HCC) 06/09/2019  . Anxiety 06/09/2019  . Chronic prescription opiate use 06/09/2019  . Hyperglycemia 06/09/2019  . Subclavian artery stenosis, left (HCC) 08/20/2014  . Rectus diastasis 07/19/2012  . Hernia   . Edema 05/10/2011  . Goals of care, counseling/discussion 10/06/2010  . SMOKER 11/25/2009  . CAROTID BRUIT, RIGHT 11/24/2009  . Chest pain 11/24/2009  .  Hyperlipidemia 03/30/2009  . Coronary atherosclerosis 03/30/2009  . HYPERTENSION, HX OF 03/30/2009    Orientation RESPIRATION BLADDER Height & Weight     Self, Time, Situation, Place  O2 (3L [place]) Continent Weight: 88 kg Height:  6' (182.9 cm)  BEHAVIORAL SYMPTOMS/MOOD NEUROLOGICAL BOWEL NUTRITION STATUS      Continent Diet (Regular)  AMBULATORY STATUS COMMUNICATION OF NEEDS Skin   Limited Assist Verbally Normal                       Personal Care Assistance Level of Assistance              Functional Limitations Info             SPECIAL CARE FACTORS FREQUENCY                       Contractures Contractures Info: Not present    Additional Factors Info  Code Status, Allergies Code Status Info: FULL Allergies Info: Acetaminophen, Fentanyl, Nsaids           Medication List    TAKE these medications   amiodarone 200 MG tablet Commonly known as: PACERONE Take 1 tablet (200 mg total) by mouth daily.   atorvastatin 40 MG tablet Commonly known as: LIPITOR Take 1 tablet (40 mg total) by mouth daily.   azithromycin 250 MG tablet Commonly known as: Zithromax Take 250 mg (1 tablet) by mouth daily for 2 days.   baclofen 20 MG tablet Commonly known as: LIORESAL Take 20 mg by mouth 3 (three) times daily.   bisoprolol  5 MG tablet Commonly known as: ZEBETA Take 2.5 mg by mouth daily.   budesonide 0.5 MG/2ML nebulizer solution Commonly known as: PULMICORT Take 0.5 mg by nebulization 2 (two) times daily.   cyclobenzaprine 5 MG tablet Commonly known as: FLEXERIL Take 1 tablet (5 mg total) by mouth 3 (three) times daily as needed. What changed: reasons to take this   DULoxetine 60 MG capsule Commonly known as: CYMBALTA Take 60 mg by mouth daily.   Ensure Max Protein Liqd Take 330 mLs (11 oz total) by mouth 2 (two) times daily between meals.   fluticasone 50 MCG/ACT nasal spray Commonly known as: FLONASE Place 1 spray into both  nostrils 2 (two) times daily.   insulin glargine 100 UNIT/ML injection Commonly known as: LANTUS Inject 11 Units into the skin at bedtime.   insulin lispro 100 UNIT/ML injection Commonly known as: HUMALOG Inject 4 Units into the skin See admin instructions. Inject 4 units with blood sugar 150-250; inject 6 units 251-350; greater than 351 call provider; Hold for FSBG <150   ipratropium-albuterol 0.5-2.5 (3) MG/3ML Soln Commonly known as: DUONEB Take 3 mLs by nebulization every 6 (six) hours as needed. What changed: reasons to take this   lactulose 10 GM/15ML solution Commonly known as: CHRONULAC Take 20 g by mouth 2 (two) times daily.   lamoTRIgine 100 MG tablet Commonly known as: LAMICTAL Take 100 mg by mouth daily.   magnesium oxide 400 MG tablet Commonly known as: MAG-OX Take 400 mg by mouth daily.   midodrine 10 MG tablet Commonly known as: PROAMATINE Take 1 tablet (10 mg total) by mouth every 8 (eight) hours. Hold if systolic BP>120 What changed: additional instructions   NAC 600 MG Caps Generic drug: Acetylcysteine Take 600 mg by mouth daily.   nicotine 21 mg/24hr patch Commonly known as: NICODERM CQ - dosed in mg/24 hours Place 21 mg onto the skin daily.   nicotine polacrilex 2 MG lozenge Commonly known as: COMMIT Take 2 mg by mouth every 6 (six) hours as needed for smoking cessation.   Nitrostat 0.4 MG SL tablet Generic drug: nitroGLYCERIN DISSOLVE (1) TABLET UNDER TONGUE AS NEEDED TO RELIEVE CHEST PAIN. MAYREPEAT EVERY 5 MINUTES. What changed: See the new instructions.   Oxycodone HCl 10 MG Tabs Take 1 tablet (10 mg total) by mouth 3 (three) times daily. What changed: when to take this   oxymetazoline 0.05 % nasal spray Commonly known as: AFRIN Place 2 sprays into both nostrils 2 (two) times daily as needed for congestion.   pantoprazole 40 MG tablet Commonly known as: PROTONIX Take 40 mg by mouth daily.   predniSONE 10 MG tablet  Commonly known as: DELTASONE Take 4 tablets (40 mg total) by mouth daily for 2 days, THEN 3 tablets (30 mg total) daily for 2 days, THEN 2 tablets (20 mg total) daily for 2 days, THEN 1 tablet (10 mg total) daily for 2 days, THEN 0.5 tablets (5 mg total) daily for 2 days. Resume daily 2.5 mg by mouth daily after taper.. Start taking on: December 15, 2019 What changed:   medication strength  See the new instructions.   pregabalin 200 MG capsule Commonly known as: LYRICA Take 1 capsule (200 mg total) by mouth 3 (three) times daily.   rivaroxaban 20 MG Tabs tablet Commonly known as: XARELTO Take 20 mg by mouth daily with supper.   roflumilast 500 MCG Tabs tablet Commonly known as: DALIRESP Take 250 mcg by mouth daily.   SALONPAS  PAIN RELIEF PATCH EX Place 1 patch onto the skin every 12 (twelve) hours as needed (aches and pains).   senna-docusate 8.6-50 MG tablet Commonly known as: Senokot-S Take 1 tablet by mouth daily.   sodium chloride 0.65 % Soln nasal spray Commonly known as: OCEAN Place 1 spray into both nostrils as needed for congestion.   tamsulosin 0.4 MG Caps capsule Commonly known as: FLOMAX Take 1 capsule (0.4 mg total) by mouth daily after supper.   traZODone 100 MG tablet Commonly known as: DESYREL Take 100 mg by mouth at bedtime.   Trelegy Ellipta 100-62.5-25 MCG/INH Aepb Generic drug: Fluticasone-Umeclidin-Vilant Inhale 1 puff into the lungs daily.   varenicline 1 MG tablet Commonly known as: CHANTIX Take 1 mg by mouth 2 (two) times daily.   Vitamin D3 50 MCG (2000 UT) Tabs Take 2,000 Units by mouth daily    Relevant Imaging Results:  Relevant Lab Results:   Additional Information SS# [id number]  Chapman Fitch, RN

## 2019-12-15 NOTE — TOC Initial Note (Signed)
Transition of Care [facility]) - Initial/Assessment Note    Patient Details  Name: Gregory Hall MRN: [id number] Date of Birth: 09/06/1955  [facility] CM/SW Contact:    Chapman Fitch, RN Phone Number: 12/15/2019, 3:40 PM  Clinical Narrative:                 Patient admitted from [facility]. Patient states that he has lived there since March and wishes to return.   Patient states that he has portable O2, WC and RW at the facility.  Patient request EMS transport for discharge.  Transport arranged at [facility]	[place] on chart  Bedside RN to give report to the family care home   Patient states he uses medicaid transportation to routine appointments.  TOC notified Annette at care home of discharge. Fl2 faxed to [phone]   Expected Discharge Plan: Group Home (Family care home) Barriers to Discharge: No Barriers Identified   Patient Goals and CMS Choice        Expected Discharge Plan and Services Expected Discharge Plan: Group Home (Family care home)       Living arrangements for the past 2 months: Group Home (family care home) Expected Discharge Date: 12/15/19                                    Prior Living Arrangements/Services Living arrangements for the past 2 months: Group Home (family care home) Lives with:: Facility Resident Patient language and need for interpreter reviewed:: Yes Do you feel safe going back to the place where you live?: Yes      Need for Family Participation in Patient Care: Yes (Comment) Care giver support system in place?: Yes (comment)   Criminal Activity/Legal Involvement Pertinent to Current Situation/Hospitalization: No - Comment as needed  Activities of Daily Living Home Assistive Devices/Equipment: Wheelchair ADL Screening (condition at time of admission) Patient's cognitive ability adequate to safely complete daily activities?: Yes Is the patient deaf or have difficulty hearing?: No Does the  patient have difficulty seeing, even when wearing glasses/contacts?: No Does the patient have difficulty concentrating, remembering, or making decisions?: No Patient able to express need for assistance with ADLs?: Yes Does the patient have difficulty dressing or bathing?: Yes Independently performs ADLs?: No Communication: Independent Dressing (OT): Needs assistance Is this a change from baseline?: Pre-admission baseline Grooming: Independent Feeding: Independent Bathing: Needs assistance Is this a change from baseline?: Pre-admission baseline Toileting: Needs assistance Is this a change from baseline?: Pre-admission baseline In/Out Bed: Needs assistance Is this a change from baseline?: Pre-admission baseline Walks in Home: Independent with device (comment) (wheelchair) Does the patient have difficulty walking or climbing stairs?: Yes Weakness of Legs: Both Weakness of Arms/Hands: None  Permission Sought/Granted                  Emotional Assessment Appearance:: Appears stated age     Orientation: : Oriented to Self, Oriented to  Time, Oriented to Place, Oriented to Situation      Admission diagnosis:  Acute respiratory failure (HCC) [J96.00] Hyperkalemia [E87.5] Lactic acidosis [E87.2] Respiratory acidosis [E87.2] Confusion [R41.0] COPD exacerbation (HCC) [J44.1] COPD with acute exacerbation (HCC) [J44.1] Acute urinary retention [R33.8] HCAP (healthcare-associated pneumonia) [J18.9] Patient Active Problem List   Diagnosis Date Noted  . Acute respiratory failure (HCC) 12/13/2019  . Palliative care by specialist   . DNR (do not resuscitate)   .  Acute CHF (congestive heart failure) (HCC) 10/08/2019  . Chronic respiratory failure with hypoxia (HCC) 10/08/2019  . Atrial flutter (HCC)   . Acute on chronic respiratory failure with hypoxia (HCC) 09/13/2019  . Acute diastolic CHF (congestive heart failure) (HCC) 09/12/2019  . NSTEMI (non-ST elevated myocardial  infarction) (HCC) 09/12/2019  . Acute kidney failure, unspecified (HCC) 09/12/2019  . Diabetes mellitus type 2, uncontrolled, with complications (HCC) 06/10/2019  . Chronic pain syndrome 06/10/2019  . COPD exacerbation (HCC) 06/10/2019  . COPD with acute exacerbation (HCC) 06/09/2019  . CAD (coronary artery disease) 06/09/2019  . HTN (hypertension) 06/09/2019  . Acute on chronic respiratory failure with hypoxia and hypercapnia (HCC) 06/09/2019  . Anxiety 06/09/2019  . Chronic prescription opiate use 06/09/2019  . Hyperglycemia 06/09/2019  . Subclavian artery stenosis, left (HCC) 08/20/2014  . Rectus diastasis 07/19/2012  . Hernia   . Edema 05/10/2011  . Goals of care, counseling/discussion 10/06/2010  . SMOKER 11/25/2009  . CAROTID BRUIT, RIGHT 11/24/2009  . Chest pain 11/24/2009  . Hyperlipidemia 03/30/2009  . Coronary atherosclerosis 03/30/2009  . HYPERTENSION, HX OF 03/30/2009   PCP:  Enid Baas, MD Pharmacy:   [facility] - [place], [place] - [address] [place] [address] Phone: [phone] Fax: [phone]     Social Determinants of Health (SDOH) Interventions    Readmission Risk Interventions Readmission Risk Prevention Plan 12/15/2019 09/24/2019 09/14/2019  Transportation Screening Complete Complete Complete  PCP or Specialist Appt within 3-5 Days - - Complete  HRI or Home Care Consult - - Complete  Social Work Consult for Recovery Care Planning/Counseling - - Complete  Palliative Care Screening - - Not Applicable  Medication Review Oceanographer) Complete Complete Complete  PCP or Specialist appointment within 3-5 days of discharge - Complete -  HRI or Home Care Consult Complete Complete -  Palliative Care Screening Not Applicable Complete -  Skilled Nursing Facility Not Applicable Complete -  Some recent data might be hidden

## 2019-12-15 NOTE — Progress Notes (Signed)
Verified discharge, return to previous group home.  Patient assessment stable, vitals stable. No signs of distress  Reviewed discharge information with Drinda Butts, caregiver at group home- verbalized understanding of medication changes, discharge instructions.  Provided discharge AVS packet to EMS- report to EMS.  IV's removed.  Patient dressed.  Departed via EMS at 1600

## 2019-12-15 NOTE — Progress Notes (Signed)
o2 now on 3 liters as saturation on 4 noted at 96. Patient not entertaining idea of bipap. He states his breathing is fine and bipap is still too much for him to handle. bipap remains on standby. Patient is alert at this time. Will continue to monitor.

## 2019-12-15 NOTE — Evaluation (Addendum)
Physical Therapy Evaluation Patient Details Name: Gregory Hall MRN: [id number] DOB: 10/15/1955 Today's Date: 12/15/2019   History of Present Illness  Pt is a 64 y.o. malewho presents to the ED for evaluation of shortness of breath. Chart review indicates 10/13-10/18 admission to our facility due to severe COPD exacerbation requiring intubation upon presentation. Typically remains on 3 L nasal cannula due to COPD.  A. fib on Xarelto.  HTN, HLD, CAD s/p stenting, preserved ejection fraction, DM on insulin.  He lives in a local group home.    Clinical Impression  Pt A&Ox4, reported chronic back pain, but that overall he is feeling better compared to prior to admission. Pt reported that he lives at a group home that provides assist with meals/medications and ADLs as needed. Pt is normally able to transfer to [facility] independently.  The patient demonstrated bed mobility modI with use of rails, and good sitting balance. Stand pivot to recliner in room with UE support with modI as well (assist with lines/leads but no physical assist needed). No unsteadiness noted. Pt on 3L for mobility, did desaturate to 86%, but with time and rest recovered to 89%. Pt endorsed at baseline he is on 3L and will turn it up to 4L with mobility. The patient demonstrated and reported return to baseline level of functioning, no further acute PT needs indicated. PT to sign off. Please reconsult PT if pt status changes or acute needs are identified.      Follow Up Recommendations No PT follow up    Equipment Recommendations  None recommended by PT    Recommendations for Other Services       Precautions / Restrictions Precautions Precautions: Fall Restrictions Weight Bearing Restrictions: No      Mobility  Bed Mobility Overal bed mobility: Modified Independent                  Transfers Overall transfer level: Modified independent Equipment used: None                Ambulation/Gait              General Gait Details: deferred, pt non ambulatory at baseline  Stairs            Wheelchair Mobility    Modified Rankin (Stroke Patients Only)       Balance Overall balance assessment: Needs assistance Sitting-balance support: Feet supported Sitting balance-Leahy Scale: Good       Standing balance-Leahy Scale: Poor Standing balance comment: reliant on UE for transfer                             Pertinent Vitals/Pain Pain Assessment: Faces Faces Pain Scale: Hurts a little bit Pain Location: endorsed chronic back pain Pain Descriptors / Indicators: Aching;Grimacing Pain Intervention(s): Limited activity within patient's tolerance;Repositioned;Monitored during session    Home Living Family/patient expects to be discharged to:: Group home               Home Equipment: Bedside commode;Wheelchair - manual;Hand held shower head;Grab bars - tub/shower;Grab bars - toilet      Prior Function Level of Independence: Independent with assistive device(s)         Comments: Pt reports he is generally independent with ADL management but accepts help as needed (sink baths). He has not walked more than a few steps in >1 year, but is able to stand pivot to/from his WC w/o assist. Manages dressing and  bathing tasks independently. ALF staff support meal prep, medication management, and can provide physical assist PRN.     Hand Dominance   Dominant Hand: Right    Extremity/Trunk Assessment   Upper Extremity Assessment Upper Extremity Assessment: Generalized weakness    Lower Extremity Assessment Lower Extremity Assessment: Generalized weakness    Cervical / Trunk Assessment Cervical / Trunk Assessment: Kyphotic  Communication   Communication: No difficulties  Cognition Arousal/Alertness: Awake/alert Behavior During Therapy: WFL for tasks assessed/performed Overall Cognitive Status: Within Functional Limits for tasks assessed                                         General Comments      Exercises     Assessment/Plan    PT Assessment Patent does not need any further PT services  PT Problem List Decreased strength;Decreased activity tolerance       PT Treatment Interventions      PT Goals (Current goals can be found in the Care Plan section)       Frequency     Barriers to discharge        Co-evaluation               AM-PAC PT "6 Clicks" Mobility  Outcome Measure Help needed turning from your back to your side while in a flat bed without using bedrails?: A Little Help needed moving from lying on your back to sitting on the side of a flat bed without using bedrails?: A Little Help needed moving to and from a bed to a chair (including a wheelchair)?: None Help needed standing up from a chair using your arms (e.g., wheelchair or bedside chair)?: None Help needed to walk in hospital room?: Total Help needed climbing 3-5 steps with a railing? : Total 6 Click Score: 16    End of Session Equipment Utilized During Treatment: Gait belt Activity Tolerance: Patient tolerated treatment well Patient left: in chair;with chair alarm set;with call bell/phone within reach Nurse Communication: Mobility status PT Visit Diagnosis: Other abnormalities of gait and mobility (R26.89)    Time: 3335-4562 PT Time Calculation (min) (ACUTE ONLY): 18 min   Charges:   PT Evaluation $PT Eval Low Complexity: 1 Low        Olga Coaster PT, DPT 10:18 AM,12/15/19

## 2019-12-15 NOTE — Progress Notes (Signed)
OT Screen Note  Patient Details Name: Gregory Hall MRN: [id number] DOB: [birth year]   Cancelled Treatment:    Reason Eval/Treat Not Completed: OT screened, no needs identified, will sign off. Consult received, chart reviewed. Spoke with PT prior to attempt. On attempt, pt alert and oriented, seated up in recliner, denies concerns. Modified indep with transfers, at baseline mod indep for ADL and has assist for ADL tasks at his group home as needed. They assist with meals, medications. Pt at baseline 3L O2 in room. VSS. No skilled OT needs appreciated. Pt in agreement, denies additional concerns. Will sign off. Please re-consult if additional needs arise.   Richrd Prime, MPH, MS, OTR/L ascom [phone] 12/15/19, 10:25 AM

## 2019-12-16 ENCOUNTER — Encounter: Payer: Self-pay | Admitting: Physician Assistant

## 2019-12-18 LAB — CULTURE, BLOOD (SINGLE)
Culture: NO GROWTH
Special Requests: ADEQUATE

## 2019-12-26 ENCOUNTER — Other Ambulatory Visit: Payer: Self-pay | Admitting: Neurology

## 2019-12-26 DIAGNOSIS — R251 Tremor, unspecified: Secondary | ICD-10-CM

## 2019-12-29 ENCOUNTER — Ambulatory Visit (INDEPENDENT_AMBULATORY_CARE_PROVIDER_SITE_OTHER): Payer: Medicare Other | Admitting: Family

## 2019-12-29 ENCOUNTER — Other Ambulatory Visit: Payer: Self-pay

## 2019-12-29 ENCOUNTER — Encounter: Payer: Self-pay | Admitting: Family

## 2019-12-29 VITALS — BP 100/44 | HR 70 | Ht 72.0 in | Wt 189.0 lb

## 2019-12-29 DIAGNOSIS — R251 Tremor, unspecified: Secondary | ICD-10-CM | POA: Diagnosis not present

## 2019-12-29 DIAGNOSIS — I4892 Unspecified atrial flutter: Secondary | ICD-10-CM | POA: Diagnosis not present

## 2019-12-29 DIAGNOSIS — I771 Stricture of artery: Secondary | ICD-10-CM

## 2019-12-29 DIAGNOSIS — I959 Hypotension, unspecified: Secondary | ICD-10-CM

## 2019-12-29 DIAGNOSIS — I5032 Chronic diastolic (congestive) heart failure: Secondary | ICD-10-CM | POA: Diagnosis not present

## 2019-12-29 DIAGNOSIS — I2510 Atherosclerotic heart disease of native coronary artery without angina pectoris: Secondary | ICD-10-CM

## 2019-12-29 DIAGNOSIS — I6523 Occlusion and stenosis of bilateral carotid arteries: Secondary | ICD-10-CM

## 2019-12-29 DIAGNOSIS — E785 Hyperlipidemia, unspecified: Secondary | ICD-10-CM

## 2019-12-29 NOTE — Patient Instructions (Addendum)
Medication Instructions:  Your physician has recommended you make the following change in your medication:   STOP Bisoprolol  Continue Midodrine Three times a day - (If lightheaded or dizzy please check BP. Anticipate it is due to hypotension. If BP is less than 110/60 and did not receive Midodrine at last dose, okay to administer.)  *If you need a refill on your cardiac medications before your next appointment, please call your pharmacy*   Lab Work: None ordered today.   Testing/Procedures: Recommend scheduling carotid duplex as previously ordered. Your provider has requested that you have a carotid duplex. This test is an ultrasound of the carotid arteries in your neck. It looks at blood flow through these arteries that supply the brain with blood. Allow one hour for this exam. There are no restrictions or special instructions.  Follow-Up: At [facility], you and your health needs are our priority.  As part of our continuing mission to provide you with exceptional heart care, we have created designated Provider Care Teams.  These Care Teams include your primary Cardiologist (physician) and Advanced Practice Providers (APPs -  Physician Assistants and Nurse Practitioners) who all work together to provide you with the care you need, when you need it.  We recommend signing up for the patient portal called "MyChart".  Sign up information is provided on this After Visit Summary.  MyChart is used to connect with patients for Virtual Visits (Telemedicine).  Patients are able to view lab/test results, encounter notes, upcoming appointments, etc.  Non-urgent messages can be sent to your provider as well.   To learn more about what you can do with MyChart, go to [email or web address].    Your next appointment:   2-3 months  The format for your next appointment:   In Person  Provider:   You may see Lorine Bears, MD  or one of the following Advanced Practice Providers on your designated  Care Team:    Nicolasa Ducking, NP  Eula Listen, PA-C  Marisue Ivan, PA-C  Cadence Fransico Michael, [place]  Gillian Shields, NP  Other Instructions  1) Please assist with meals due to tremors  2) If lightheaded or dizzy please check BP. Anticipate it is due to hypotension. If BP is less than 110/60 and did not receive Midodrine at last dose, okay to administer.
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
## 2020-01-04 ENCOUNTER — Ambulatory Visit: Payer: Medicare Other

## 2020-01-07 ENCOUNTER — Other Ambulatory Visit: Payer: Self-pay

## 2020-01-07 ENCOUNTER — Ambulatory Visit
Admission: RE | Admit: 2020-01-07 | Discharge: 2020-01-07 | Disposition: A | Discharge status: Home / Self Care | Payer: Medicare Other | Source: Ambulatory Visit | Attending: Neurology | Admitting: Neurology

## 2020-01-07 DIAGNOSIS — R251 Tremor, unspecified: Secondary | ICD-10-CM

## 2020-01-19 ENCOUNTER — Telehealth: Payer: Self-pay | Admitting: Cardiovascular Disease

## 2020-01-19 NOTE — Telephone Encounter (Signed)
Pt c/o BP issue: STAT if pt c/o blurred vision, one-sided weakness or slurred speech  1. What are your last 5 BP readings?  2 pm today 128/61 Yesterday morning 191/103, 201/115 States his readings are "all over the place"  2. Are you having any other symptoms (ex. Dizziness, headache, blurred vision, passed out)?   3. What is your BP issue? elevated

## 2020-01-19 NOTE — Telephone Encounter (Signed)
Spoke with Brandi @ [facility] where the pt resides.  Brandi sts that the patients BP has been having significant fluctuations. Pt parameters for midodrine is that it is given for morning BPs <120. He has not been given he has not been given midodrine for a few days. His morning SBPs on Sat and Sun were in the 190-200's. BP this morning prior to medication 152/83. BP @ 2pm 128/61.  The NP the rounds on the their patients monthly suggested that they contact the pt Cardiologist for medication recommendations.  Offered to schedule a sooner appt with Dr. Kirke Corin, but the patient has other appts everyday this week. Appt scheduled for 01/27/20 @ 4:40pm with Dr. Kirke Corin. Patient has an appt this week with Clarisa Kindred, NP.  They will continue to monitor the pt BP. Elta [place] that I will fwd the update to Dr. Kirke Corin and call back if he has additional recommendations prior or the appt. Brandi agreeable with the plan.

## 2020-01-21 ENCOUNTER — Ambulatory Visit: Payer: Medicare Other | Attending: Family | Admitting: Family

## 2020-01-21 ENCOUNTER — Encounter: Payer: Self-pay | Admitting: Family

## 2020-01-21 ENCOUNTER — Other Ambulatory Visit: Payer: Self-pay

## 2020-01-21 VITALS — BP 137/65 | HR 76 | Resp 18 | Ht 72.0 in | Wt 208.0 lb

## 2020-01-21 DIAGNOSIS — E119 Type 2 diabetes mellitus without complications: Secondary | ICD-10-CM | POA: Diagnosis not present

## 2020-01-21 DIAGNOSIS — R062 Wheezing: Secondary | ICD-10-CM | POA: Insufficient documentation

## 2020-01-21 DIAGNOSIS — R0602 Shortness of breath: Secondary | ICD-10-CM | POA: Diagnosis not present

## 2020-01-21 DIAGNOSIS — J440 Chronic obstructive pulmonary disease with acute lower respiratory infection: Secondary | ICD-10-CM | POA: Diagnosis not present

## 2020-01-21 DIAGNOSIS — I5032 Chronic diastolic (congestive) heart failure: Secondary | ICD-10-CM | POA: Diagnosis not present

## 2020-01-21 DIAGNOSIS — I110 Hypertensive heart disease with heart failure: Secondary | ICD-10-CM | POA: Insufficient documentation

## 2020-01-21 DIAGNOSIS — Z794 Long term (current) use of insulin: Secondary | ICD-10-CM | POA: Insufficient documentation

## 2020-01-21 DIAGNOSIS — Z955 Presence of coronary angioplasty implant and graft: Secondary | ICD-10-CM | POA: Insufficient documentation

## 2020-01-21 DIAGNOSIS — Z885 Allergy status to narcotic agent status: Secondary | ICD-10-CM | POA: Insufficient documentation

## 2020-01-21 DIAGNOSIS — Z886 Allergy status to analgesic agent status: Secondary | ICD-10-CM | POA: Diagnosis not present

## 2020-01-21 DIAGNOSIS — I2510 Atherosclerotic heart disease of native coronary artery without angina pectoris: Secondary | ICD-10-CM | POA: Diagnosis not present

## 2020-01-21 DIAGNOSIS — J449 Chronic obstructive pulmonary disease, unspecified: Secondary | ICD-10-CM

## 2020-01-21 DIAGNOSIS — Z7901 Long term (current) use of anticoagulants: Secondary | ICD-10-CM | POA: Insufficient documentation

## 2020-01-21 DIAGNOSIS — I10 Essential (primary) hypertension: Secondary | ICD-10-CM

## 2020-01-21 DIAGNOSIS — G8929 Other chronic pain: Secondary | ICD-10-CM | POA: Diagnosis not present

## 2020-01-21 DIAGNOSIS — F17210 Nicotine dependence, cigarettes, uncomplicated: Secondary | ICD-10-CM | POA: Insufficient documentation

## 2020-01-21 DIAGNOSIS — E1165 Type 2 diabetes mellitus with hyperglycemia: Secondary | ICD-10-CM

## 2020-01-21 DIAGNOSIS — R5383 Other fatigue: Secondary | ICD-10-CM | POA: Insufficient documentation

## 2020-01-21 DIAGNOSIS — R059 Cough, unspecified: Secondary | ICD-10-CM | POA: Diagnosis not present

## 2020-01-21 DIAGNOSIS — Z79899 Other long term (current) drug therapy: Secondary | ICD-10-CM | POA: Insufficient documentation

## 2020-01-21 DIAGNOSIS — Z7951 Long term (current) use of inhaled steroids: Secondary | ICD-10-CM | POA: Insufficient documentation

## 2020-01-21 DIAGNOSIS — IMO0002 Reserved for concepts with insufficient information to code with codable children: Secondary | ICD-10-CM

## 2020-01-21 MED ORDER — TORSEMIDE 20 MG PO TABS: 40.0000 mg | ORAL_TABLET | Freq: Every day | ORAL | 3 refills | Status: DC

## 2020-01-21 NOTE — Patient Instructions (Addendum)
Begin weighing daily and call for an overnight weight gain of > 2 pounds or a weekly weight gain of >5 pounds.   Begin torsemide 40mg  daily (2 tablets every morning)
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
## 2020-01-22 ENCOUNTER — Ambulatory Visit
Admission: RE | Admit: 2020-01-22 | Discharge: 2020-01-22 | Disposition: A | Discharge status: Home / Self Care | Payer: Medicare Other | Source: Ambulatory Visit | Attending: Neurology | Admitting: Neurology

## 2020-01-22 ENCOUNTER — Other Ambulatory Visit: Payer: Self-pay

## 2020-01-22 DIAGNOSIS — R251 Tremor, unspecified: Secondary | ICD-10-CM | POA: Insufficient documentation

## 2020-01-22 NOTE — Telephone Encounter (Signed)
This is best addressed with an office visit which is already scheduled next week.  Thanks.

## 2020-01-23 NOTE — Telephone Encounter (Signed)
Noted  

## 2020-01-26 ENCOUNTER — Telehealth: Payer: Self-pay | Admitting: Cardiovascular Disease

## 2020-01-26 NOTE — Telephone Encounter (Signed)
Attempted to call the patient x 3 tries. No answer- no voice mail.  The ring does change after several rings and sounds like it goes to a fax machine.  Will attempt to call back at a later time.

## 2020-01-26 NOTE — Telephone Encounter (Signed)
Pt c/o BP issue: STAT if pt c/o blurred vision, one-sided weakness or slurred speech  1. What are your last 5 BP readings? 180/90 are the highest numbers  2. Are you having any other symptoms (ex. Dizziness, headache, blurred vision, passed out)? Headaches, new tremors (seeing Neuro about this)  3. What is your BP issue? high Has not been taking Midodrine

## 2020-01-27 ENCOUNTER — Ambulatory Visit: Payer: Medicare Other | Admitting: Cardiovascular Disease

## 2020-01-27 ENCOUNTER — Ambulatory Visit: Payer: Medicare Other | Admitting: Physician Assistant

## 2020-01-28 ENCOUNTER — Other Ambulatory Visit: Payer: Self-pay | Admitting: Neurology

## 2020-01-28 DIAGNOSIS — R4189 Other symptoms and signs involving cognitive functions and awareness: Secondary | ICD-10-CM

## 2020-01-28 DIAGNOSIS — G930 Cerebral cysts: Secondary | ICD-10-CM

## 2020-01-28 DIAGNOSIS — R9089 Other abnormal findings on diagnostic imaging of central nervous system: Secondary | ICD-10-CM

## 2020-01-29 ENCOUNTER — Other Ambulatory Visit: Payer: Self-pay | Admitting: *Deleted

## 2020-01-29 MED ORDER — BISOPROLOL FUMARATE 5 MG PO TABS: 2.5000 mg | ORAL_TABLET | ORAL | 0 refills | Status: DC

## 2020-01-29 NOTE — Telephone Encounter (Signed)
Left voicemail message to call back for recommendations prior to his upcoming appointment.

## 2020-01-29 NOTE — Telephone Encounter (Signed)
Spoke with patient and he reviewed blood pressure being elevated and advised we like it to be lower than 140/80. Requested that he continue monitoring blood pressure readings and keep a log of those readings. Offered earlier appointment than next Friday but he does not want early morning due to need for transportation. Confirmed appointment for carotid on Tuesday and also for Friday with provider. Also reviewed to call if blood pressures persistently remain elevated and to continue holding his midodrine. Pt states he is seeing neurologist for the headaches and tremors. Let him know that I would send this message to provider and if any further recommendations that I would give him a call back. He verbalized understanding of our conversation, agreement with plan, and had no further questions at this time.

## 2020-01-29 NOTE — Telephone Encounter (Signed)
Please have him resume bisoprolol 2.5 mg daily for systolic BP > 140 mmHg. Hesitant to start higher at this time given prior hypotension. Also, it appears he was recently started on torsemide by outside office. He now has torsemide and Lasix on his medication list. Please ensure he is not taking both of the loop diuretics. Keep follow up appointment.

## 2020-01-29 NOTE — Addendum Note (Signed)
Addended by: Bryna Colander on: 01/29/2020 10:13 AM   Modules accepted: Orders

## 2020-01-29 NOTE — Telephone Encounter (Addendum)
Spoke with patient and reviewed provider recommendations to start Bisoprolol half tablet 2.5 mg once daily if systolic blood pressure (Top number) is greater than 140. Inquired if he was still taking furosemide and torsemide. He reports that he is only taking torsemide and they discontinued his furosemide. Also confirmed his upcoming appointment again and which pharmacy he would like me to send medication. He verbalized understanding of all instructions, agreement with plan, and had no further questions at this time.
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
## 2020-02-03 ENCOUNTER — Other Ambulatory Visit: Payer: Self-pay

## 2020-02-03 ENCOUNTER — Ambulatory Visit (INDEPENDENT_AMBULATORY_CARE_PROVIDER_SITE_OTHER): Payer: Medicare Other

## 2020-02-03 ENCOUNTER — Telehealth: Payer: Self-pay | Admitting: *Deleted

## 2020-02-03 DIAGNOSIS — I6523 Occlusion and stenosis of bilateral carotid arteries: Secondary | ICD-10-CM | POA: Diagnosis not present

## 2020-02-03 NOTE — Telephone Encounter (Signed)
Attempted to call pt to review results. No answer. Lmtcb.  

## 2020-02-03 NOTE — Telephone Encounter (Signed)
-----   Message from Alver Sorrow, NP sent at 02/03/2020  3:31 PM EST ----- Carotid duplex shows bilateral 40-50% stenosis. No indication for repair. Continue Atorvastatin as keeping cholesterol well controlled will help prevent progression. Recommend repeat study in 1 year.

## 2020-02-06 ENCOUNTER — Ambulatory Visit (INDEPENDENT_AMBULATORY_CARE_PROVIDER_SITE_OTHER): Payer: Medicare Other | Admitting: Medical

## 2020-02-06 ENCOUNTER — Encounter: Payer: Self-pay | Admitting: Physician Assistant

## 2020-02-06 ENCOUNTER — Other Ambulatory Visit: Payer: Self-pay

## 2020-02-06 ENCOUNTER — Other Ambulatory Visit: Payer: Self-pay | Admitting: *Deleted

## 2020-02-06 VITALS — BP 98/42 | HR 75 | Ht 72.0 in

## 2020-02-06 DIAGNOSIS — I2510 Atherosclerotic heart disease of native coronary artery without angina pectoris: Secondary | ICD-10-CM | POA: Diagnosis not present

## 2020-02-06 DIAGNOSIS — J449 Chronic obstructive pulmonary disease, unspecified: Secondary | ICD-10-CM | POA: Diagnosis not present

## 2020-02-06 DIAGNOSIS — E785 Hyperlipidemia, unspecified: Secondary | ICD-10-CM

## 2020-02-06 DIAGNOSIS — I959 Hypotension, unspecified: Secondary | ICD-10-CM

## 2020-02-06 DIAGNOSIS — I4892 Unspecified atrial flutter: Secondary | ICD-10-CM

## 2020-02-06 DIAGNOSIS — I6523 Occlusion and stenosis of bilateral carotid arteries: Secondary | ICD-10-CM

## 2020-02-06 NOTE — Patient Instructions (Addendum)
Medication Instructions:  Your physician has recommended you make the following change in your medication:  1. CHANGE Take Midodrine if Systolic Blood pressure number is less than 100.  *If you need a refill on your cardiac medications before your next appointment, please call your pharmacy*   Lab Work: None  If you have labs (blood work) drawn today and your tests are completely normal, you will receive your results only by: Marland Kitchen MyChart Message (if you have MyChart) OR . A paper copy in the mail If you have any lab test that is abnormal or we need to change your treatment, we will call you to review the results.   Testing/Procedures: None   Follow-Up: At [facility], you and your health needs are our priority.  As part of our continuing mission to provide you with exceptional heart care, we have created designated Provider Care Teams.  These Care Teams include your primary Cardiologist (physician) and Advanced Practice Providers (APPs -  Physician Assistants and Nurse Practitioners) who all work together to provide you with the care you need, when you need it.  Your next appointment:   1 week(s)  The format for your next appointment:   In Person  Provider:   You may see Lorine Bears, MD or one of the following Advanced Practice Providers on your designated Care Team:    Nicolasa Ducking, NP  Eula Listen, PA-C  Marisue Ivan, PA-C  Cadence Rapelje, [place]  Gillian Shields, NP

## 2020-02-09 ENCOUNTER — Other Ambulatory Visit: Payer: Self-pay

## 2020-02-09 ENCOUNTER — Ambulatory Visit
Admission: RE | Admit: 2020-02-09 | Discharge: 2020-02-09 | Disposition: A | Discharge status: Home / Self Care | Payer: Medicare Other | Source: Ambulatory Visit | Attending: Neurology | Admitting: Neurology

## 2020-02-09 DIAGNOSIS — R9089 Other abnormal findings on diagnostic imaging of central nervous system: Secondary | ICD-10-CM

## 2020-02-09 DIAGNOSIS — R4189 Other symptoms and signs involving cognitive functions and awareness: Secondary | ICD-10-CM

## 2020-02-09 DIAGNOSIS — G930 Cerebral cysts: Secondary | ICD-10-CM
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
## 2020-02-12 NOTE — Telephone Encounter (Signed)
Pt has been notified of results via MyChart. Repeat carotid ultrasound ordered for 1 year.

## 2020-02-13 ENCOUNTER — Ambulatory Visit: Payer: Medicare Other | Admitting: Physician Assistant

## 2020-02-13 ENCOUNTER — Ambulatory Visit: Payer: Medicare Other

## 2020-02-16 ENCOUNTER — Encounter: Payer: Self-pay | Admitting: Physician Assistant

## 2020-02-16 ENCOUNTER — Other Ambulatory Visit: Payer: Self-pay

## 2020-02-16 ENCOUNTER — Ambulatory Visit (INDEPENDENT_AMBULATORY_CARE_PROVIDER_SITE_OTHER): Payer: Medicare Other | Admitting: Physician Assistant

## 2020-02-16 VITALS — BP 100/45 | HR 71 | Ht 74.0 in | Wt 202.0 lb

## 2020-02-16 DIAGNOSIS — I5032 Chronic diastolic (congestive) heart failure: Secondary | ICD-10-CM

## 2020-02-16 DIAGNOSIS — J9611 Chronic respiratory failure with hypoxia: Secondary | ICD-10-CM

## 2020-02-16 DIAGNOSIS — Z720 Tobacco use: Secondary | ICD-10-CM

## 2020-02-16 DIAGNOSIS — I4892 Unspecified atrial flutter: Secondary | ICD-10-CM | POA: Diagnosis not present

## 2020-02-16 DIAGNOSIS — R519 Headache, unspecified: Secondary | ICD-10-CM

## 2020-02-16 DIAGNOSIS — R0989 Other specified symptoms and signs involving the circulatory and respiratory systems: Secondary | ICD-10-CM | POA: Diagnosis not present

## 2020-02-16 DIAGNOSIS — G930 Cerebral cysts: Secondary | ICD-10-CM

## 2020-02-16 DIAGNOSIS — I739 Peripheral vascular disease, unspecified: Secondary | ICD-10-CM

## 2020-02-16 DIAGNOSIS — I2510 Atherosclerotic heart disease of native coronary artery without angina pectoris: Secondary | ICD-10-CM

## 2020-02-16 DIAGNOSIS — J9612 Chronic respiratory failure with hypercapnia: Secondary | ICD-10-CM

## 2020-02-16 DIAGNOSIS — J449 Chronic obstructive pulmonary disease, unspecified: Secondary | ICD-10-CM

## 2020-02-16 DIAGNOSIS — E785 Hyperlipidemia, unspecified: Secondary | ICD-10-CM

## 2020-02-16 DIAGNOSIS — R251 Tremor, unspecified: Secondary | ICD-10-CM

## 2020-02-16 DIAGNOSIS — I6523 Occlusion and stenosis of bilateral carotid arteries: Secondary | ICD-10-CM

## 2020-02-16 DIAGNOSIS — N183 Chronic kidney disease, stage 3 unspecified: Secondary | ICD-10-CM

## 2020-02-16 MED ORDER — MIDODRINE HCL 10 MG PO TABS: 10.0000 mg | ORAL_TABLET | Freq: Two times a day (BID) | ORAL | 3 refills | Status: AC

## 2020-02-16 NOTE — Patient Instructions (Addendum)
Medication Instructions:  Your physician has recommended you make the following change in your medication:   1. STOP Bisoprolol  2. CHANGE Midodrine and take twice a day if top blood pressure number is less than 100  *If you need a refill on your cardiac medications before your next appointment, please call your pharmacy*   Lab Work: None  If you have labs (blood work) drawn today and your tests are completely normal, you will receive your results only by: Marland Kitchen MyChart Message (if you have MyChart) OR . A paper copy in the mail If you have any lab test that is abnormal or we need to change your treatment, we will call you to review the results.   Testing/Procedures: None   Follow-Up: At [facility], you and your health needs are our priority.  As part of our continuing mission to provide you with exceptional heart care, we have created designated Provider Care Teams.  These Care Teams include your primary Cardiologist (physician) and Advanced Practice Providers (APPs -  Physician Assistants and Nurse Practitioners) who all work together to provide you with the care you need, when you need it.   Your next appointment:   3 month(s)  The format for your next appointment:   In Person  Provider:   Lorine Bears, MD or Eula Listen, PA-C   Other Instructions Called [facility] and they should be coming out to see you today to look at your equipment.

## 2020-02-16 NOTE — Progress Notes (Signed)
Spoke with Tiffany at [facility] regarding patients oxygen use when coming to appointments and that he requested someone to come out and refill his oxygen. She states that he uses a Therapist, art as well. She will send someone out to his place today to check equipment and provide further education regarding the use of his equipment.

## 2020-02-17 ENCOUNTER — Emergency Department: Payer: Medicare Other

## 2020-02-17 ENCOUNTER — Inpatient Hospital Stay: Payer: Medicare Other

## 2020-02-17 ENCOUNTER — Other Ambulatory Visit: Payer: Self-pay

## 2020-02-17 ENCOUNTER — Encounter: Payer: Self-pay | Admitting: Emergency Medicine

## 2020-02-17 ENCOUNTER — Inpatient Hospital Stay
Admission: EM | Admit: 2020-02-17 | Discharge: 2020-02-21 | DRG: 683 | Disposition: A | Discharge status: Home Health | Payer: Medicare Other | Source: Ambulatory Visit | Attending: Internal Medicine | Admitting: Internal Medicine

## 2020-02-17 DIAGNOSIS — M542 Cervicalgia: Secondary | ICD-10-CM | POA: Diagnosis present

## 2020-02-17 DIAGNOSIS — F419 Anxiety disorder, unspecified: Secondary | ICD-10-CM | POA: Diagnosis present

## 2020-02-17 DIAGNOSIS — Z20822 Contact with and (suspected) exposure to covid-19: Secondary | ICD-10-CM | POA: Diagnosis present

## 2020-02-17 DIAGNOSIS — E119 Type 2 diabetes mellitus without complications: Secondary | ICD-10-CM | POA: Diagnosis present

## 2020-02-17 DIAGNOSIS — K219 Gastro-esophageal reflux disease without esophagitis: Secondary | ICD-10-CM | POA: Diagnosis present

## 2020-02-17 DIAGNOSIS — Z794 Long term (current) use of insulin: Secondary | ICD-10-CM | POA: Diagnosis not present

## 2020-02-17 DIAGNOSIS — I2510 Atherosclerotic heart disease of native coronary artery without angina pectoris: Secondary | ICD-10-CM | POA: Diagnosis present

## 2020-02-17 DIAGNOSIS — R55 Syncope and collapse: Secondary | ICD-10-CM | POA: Diagnosis present

## 2020-02-17 DIAGNOSIS — Z8614 Personal history of Methicillin resistant Staphylococcus aureus infection: Secondary | ICD-10-CM

## 2020-02-17 DIAGNOSIS — I110 Hypertensive heart disease with heart failure: Secondary | ICD-10-CM | POA: Diagnosis present

## 2020-02-17 DIAGNOSIS — Z87891 Personal history of nicotine dependence: Secondary | ICD-10-CM | POA: Diagnosis not present

## 2020-02-17 DIAGNOSIS — Z79899 Other long term (current) drug therapy: Secondary | ICD-10-CM

## 2020-02-17 DIAGNOSIS — I4892 Unspecified atrial flutter: Secondary | ICD-10-CM | POA: Diagnosis present

## 2020-02-17 DIAGNOSIS — F32A Depression, unspecified: Secondary | ICD-10-CM | POA: Diagnosis present

## 2020-02-17 DIAGNOSIS — Z955 Presence of coronary angioplasty implant and graft: Secondary | ICD-10-CM

## 2020-02-17 DIAGNOSIS — J9811 Atelectasis: Secondary | ICD-10-CM | POA: Diagnosis present

## 2020-02-17 DIAGNOSIS — J9611 Chronic respiratory failure with hypoxia: Secondary | ICD-10-CM | POA: Diagnosis present

## 2020-02-17 DIAGNOSIS — G894 Chronic pain syndrome: Secondary | ICD-10-CM | POA: Diagnosis present

## 2020-02-17 DIAGNOSIS — Z7951 Long term (current) use of inhaled steroids: Secondary | ICD-10-CM

## 2020-02-17 DIAGNOSIS — J441 Chronic obstructive pulmonary disease with (acute) exacerbation: Secondary | ICD-10-CM | POA: Diagnosis present

## 2020-02-17 DIAGNOSIS — N400 Enlarged prostate without lower urinary tract symptoms: Secondary | ICD-10-CM | POA: Diagnosis present

## 2020-02-17 DIAGNOSIS — Z7952 Long term (current) use of systemic steroids: Secondary | ICD-10-CM

## 2020-02-17 DIAGNOSIS — Z7901 Long term (current) use of anticoagulants: Secondary | ICD-10-CM

## 2020-02-17 DIAGNOSIS — R9389 Abnormal findings on diagnostic imaging of other specified body structures: Secondary | ICD-10-CM

## 2020-02-17 DIAGNOSIS — I959 Hypotension, unspecified: Secondary | ICD-10-CM | POA: Diagnosis present

## 2020-02-17 DIAGNOSIS — R0602 Shortness of breath: Secondary | ICD-10-CM

## 2020-02-17 DIAGNOSIS — J9621 Acute and chronic respiratory failure with hypoxia: Secondary | ICD-10-CM | POA: Diagnosis not present

## 2020-02-17 DIAGNOSIS — I5032 Chronic diastolic (congestive) heart failure: Secondary | ICD-10-CM | POA: Diagnosis present

## 2020-02-17 DIAGNOSIS — M549 Dorsalgia, unspecified: Secondary | ICD-10-CM | POA: Diagnosis present

## 2020-02-17 DIAGNOSIS — E785 Hyperlipidemia, unspecified: Secondary | ICD-10-CM | POA: Diagnosis present

## 2020-02-17 DIAGNOSIS — N179 Acute kidney failure, unspecified: Secondary | ICD-10-CM | POA: Diagnosis present

## 2020-02-17 DIAGNOSIS — Z9981 Dependence on supplemental oxygen: Secondary | ICD-10-CM

## 2020-02-17 LAB — LACTIC ACID, PLASMA
Lactic Acid, Venous: 1.8 mmol/L (ref 0.5–1.9)
Lactic Acid, Venous: 1.3 mmol/L (ref 0.5–1.9)

## 2020-02-17 LAB — CBC WITH DIFFERENTIAL/PLATELET
Abs Immature Granulocytes: 0.04 10*3/uL (ref 0.00–0.07)
Basophils Absolute: 0.1 10*3/uL (ref 0.0–0.1)
Basophils Relative: 1 %
Eosinophils Absolute: 0.1 10*3/uL (ref 0.0–0.5)
Eosinophils Relative: 1 %
HCT: 35.6 % — ABNORMAL LOW (ref 39.0–52.0)
Hemoglobin: 10.5 g/dL — ABNORMAL LOW (ref 13.0–17.0)
Immature Granulocytes: 0 %
Lymphocytes Relative: 10 %
Lymphs Abs: 1 10*3/uL (ref 0.7–4.0)
MCH: 23.2 pg — ABNORMAL LOW (ref 26.0–34.0)
MCHC: 29.5 g/dL — ABNORMAL LOW (ref 30.0–36.0)
MCV: 78.6 fL — ABNORMAL LOW (ref 80.0–100.0)
Monocytes Absolute: 0.9 10*3/uL (ref 0.1–1.0)
Monocytes Relative: 9 %
Neutro Abs: 7.7 10*3/uL (ref 1.7–7.7)
Neutrophils Relative %: 79 %
Platelets: 354 10*3/uL (ref 150–400)
RBC: 4.53 MIL/uL (ref 4.22–5.81)
RDW: 20.3 % — ABNORMAL HIGH (ref 11.5–15.5)
WBC: 9.8 10*3/uL (ref 4.0–10.5)
nRBC: 0 % (ref 0.0–0.2)

## 2020-02-17 LAB — COMPREHENSIVE METABOLIC PANEL
ALT: 8 U/L (ref 0–44)
AST: 13 U/L — ABNORMAL LOW (ref 15–41)
Albumin: 3.1 g/dL — ABNORMAL LOW (ref 3.5–5.0)
Alkaline Phosphatase: 86 U/L (ref 38–126)
Anion gap: 13 (ref 5–15)
BUN: 24 mg/dL — ABNORMAL HIGH (ref 8–23)
CO2: 35 mmol/L — ABNORMAL HIGH (ref 22–32)
Calcium: 8.7 mg/dL — ABNORMAL LOW (ref 8.9–10.3)
Chloride: 89 mmol/L — ABNORMAL LOW (ref 98–111)
Creatinine, Ser: 3.2 mg/dL — ABNORMAL HIGH (ref 0.61–1.24)
GFR, Estimated: 20 mL/min — ABNORMAL LOW (ref >60)
Glucose, Bld: 196 mg/dL — ABNORMAL HIGH (ref 70–99)
Potassium: 3.3 mmol/L — ABNORMAL LOW (ref 3.5–5.1)
Sodium: 137 mmol/L (ref 135–145)
Total Bilirubin: 0.5 mg/dL (ref 0.3–1.2)
Total Protein: 6.8 g/dL (ref 6.5–8.1)

## 2020-02-17 LAB — TROPONIN I (HIGH SENSITIVITY)
Troponin I (High Sensitivity): 7 ng/L (ref <18)
Troponin I (High Sensitivity): 6 ng/L (ref <18)

## 2020-02-17 LAB — CBG MONITORING, ED: Glucose-Capillary: 369 mg/dL — ABNORMAL HIGH (ref 70–99)

## 2020-02-17 LAB — RESP PANEL BY RT-PCR (FLU A&B, COVID) ARPGX2
Influenza A by PCR: NEGATIVE
Influenza B by PCR: NEGATIVE
SARS Coronavirus 2 by RT PCR: NEGATIVE

## 2020-02-17 LAB — BRAIN NATRIURETIC PEPTIDE: B Natriuretic Peptide: 48.9 pg/mL (ref 0.0–100.0)

## 2020-02-17 LAB — POC SARS CORONAVIRUS 2 AG -  ED: SARS Coronavirus 2 Ag: NEGATIVE

## 2020-02-17 MED ORDER — MIDODRINE HCL 5 MG PO TABS
10.0000 mg | ORAL_TABLET | Freq: Two times a day (BID) | ORAL | Status: DC
  Administered 2020-02-17 – 2020-02-18 (×2): 10 mg via ORAL
  Filled 2020-02-17 (×3): qty 2

## 2020-02-17 MED ORDER — FLUTICASONE PROPIONATE 50 MCG/ACT NA SUSP
1.0000 | Freq: Two times a day (BID) | NASAL | Status: DC
  Administered 2020-02-18 – 2020-02-21 (×5): 1 via NASAL
  Filled 2020-02-17: qty 16

## 2020-02-17 MED ORDER — LAMOTRIGINE 100 MG PO TABS
100.0000 mg | ORAL_TABLET | Freq: Every day | ORAL | Status: DC
  Administered 2020-02-17 – 2020-02-21 (×5): 100 mg via ORAL
  Filled 2020-02-17 (×5): qty 1

## 2020-02-17 MED ORDER — SODIUM CHLORIDE 0.9 % IV SOLN: INTRAVENOUS | Status: DC

## 2020-02-17 MED ORDER — TAMSULOSIN HCL 0.4 MG PO CAPS
0.4000 mg | ORAL_CAPSULE | Freq: Every day | ORAL | Status: DC
  Administered 2020-02-17 – 2020-02-20 (×4): 0.4 mg via ORAL
  Filled 2020-02-17 (×4): qty 1

## 2020-02-17 MED ORDER — ONDANSETRON HCL 4 MG/2ML IJ SOLN
4.0000 mg | Freq: Once | INTRAMUSCULAR | Status: AC
  Administered 2020-02-17: 4 mg via INTRAVENOUS
  Filled 2020-02-17: qty 2

## 2020-02-17 MED ORDER — PREGABALIN 75 MG PO CAPS
200.0000 mg | ORAL_CAPSULE | Freq: Three times a day (TID) | ORAL | Status: DC
  Administered 2020-02-17 – 2020-02-21 (×13): 200 mg via ORAL
  Filled 2020-02-17 (×13): qty 1

## 2020-02-17 MED ORDER — SODIUM CHLORIDE 0.9 % IV SOLN
2.0000 g | Freq: Three times a day (TID) | INTRAVENOUS | Status: DC
  Administered 2020-02-17 – 2020-02-18 (×2): 2 g via INTRAVENOUS
  Filled 2020-02-17 (×4): qty 2

## 2020-02-17 MED ORDER — MAGNESIUM OXIDE 400 (241.3 MG) MG PO TABS
400.0000 mg | ORAL_TABLET | Freq: Every day | ORAL | Status: DC
  Administered 2020-02-17 – 2020-02-21 (×5): 400 mg via ORAL
  Filled 2020-02-17 (×5): qty 1

## 2020-02-17 MED ORDER — ATORVASTATIN CALCIUM 20 MG PO TABS
40.0000 mg | ORAL_TABLET | Freq: Every day | ORAL | Status: DC
  Administered 2020-02-17 – 2020-02-20 (×4): 40 mg via ORAL
  Filled 2020-02-17 (×4): qty 2

## 2020-02-17 MED ORDER — AMIODARONE HCL 200 MG PO TABS
200.0000 mg | ORAL_TABLET | Freq: Every day | ORAL | Status: DC
  Administered 2020-02-18 – 2020-02-21 (×4): 200 mg via ORAL
  Filled 2020-02-17 (×4): qty 1

## 2020-02-17 MED ORDER — LACTATED RINGERS IV BOLUS
1000.0000 mL | Freq: Once | INTRAVENOUS | Status: AC
  Administered 2020-02-17: 1000 mL via INTRAVENOUS

## 2020-02-17 MED ORDER — METHYLPREDNISOLONE SODIUM SUCC 40 MG IJ SOLR
40.0000 mg | Freq: Two times a day (BID) | INTRAMUSCULAR | Status: AC
  Administered 2020-02-17 – 2020-02-18 (×2): 40 mg via INTRAVENOUS
  Filled 2020-02-17 (×2): qty 1

## 2020-02-17 MED ORDER — PANTOPRAZOLE SODIUM 40 MG PO TBEC
40.0000 mg | DELAYED_RELEASE_TABLET | Freq: Every day | ORAL | Status: DC
  Administered 2020-02-17 – 2020-02-21 (×5): 40 mg via ORAL
  Filled 2020-02-17 (×5): qty 1

## 2020-02-17 MED ORDER — METHYLPREDNISOLONE SODIUM SUCC 125 MG IJ SOLR
125.0000 mg | Freq: Once | INTRAMUSCULAR | Status: AC
  Administered 2020-02-17: 125 mg via INTRAVENOUS
  Filled 2020-02-17: qty 2

## 2020-02-17 MED ORDER — NITROGLYCERIN 0.4 MG SL SUBL: 0.4000 mg | SUBLINGUAL_TABLET | SUBLINGUAL | Status: DC | PRN

## 2020-02-17 MED ORDER — PREDNISONE 20 MG PO TABS: 40.0000 mg | ORAL_TABLET | Freq: Every day | ORAL | Status: DC

## 2020-02-17 MED ORDER — IPRATROPIUM-ALBUTEROL 0.5-2.5 (3) MG/3ML IN SOLN
9.0000 mL | Freq: Once | RESPIRATORY_TRACT | Status: AC
  Administered 2020-02-17: 9 mL via RESPIRATORY_TRACT
  Filled 2020-02-17: qty 9

## 2020-02-17 MED ORDER — ROFLUMILAST 500 MCG PO TABS
250.0000 ug | ORAL_TABLET | Freq: Every day | ORAL | Status: DC
  Administered 2020-02-17 – 2020-02-21 (×5): 250 ug via ORAL
  Filled 2020-02-17 (×5): qty 1

## 2020-02-17 MED ORDER — ALBUTEROL SULFATE HFA 108 (90 BASE) MCG/ACT IN AERS
2.0000 | INHALATION_SPRAY | Freq: Once | RESPIRATORY_TRACT | Status: AC
  Administered 2020-02-17: 2 via RESPIRATORY_TRACT
  Filled 2020-02-17: qty 6.7

## 2020-02-17 MED ORDER — OXYCODONE HCL 5 MG PO TABS
10.0000 mg | ORAL_TABLET | Freq: Four times a day (QID) | ORAL | Status: DC
  Administered 2020-02-17 – 2020-02-21 (×16): 10 mg via ORAL
  Filled 2020-02-17: qty 2
  Filled 2020-02-17: qty 1
  Filled 2020-02-17 (×15): qty 2
  Filled 2020-02-17 (×2): qty 1
  Filled 2020-02-17: qty 2

## 2020-02-17 MED ORDER — IPRATROPIUM-ALBUTEROL 0.5-2.5 (3) MG/3ML IN SOLN
3.0000 mL | Freq: Four times a day (QID) | RESPIRATORY_TRACT | Status: DC | PRN
  Administered 2020-02-18 – 2020-02-20 (×4): 3 mL via RESPIRATORY_TRACT
  Filled 2020-02-17 (×4): qty 3

## 2020-02-17 MED ORDER — OXYMETAZOLINE HCL 0.05 % NA SOLN
2.0000 | Freq: Two times a day (BID) | NASAL | Status: DC | PRN
  Filled 2020-02-17: qty 15

## 2020-02-17 MED ORDER — INSULIN ASPART 100 UNIT/ML ~~LOC~~ SOLN
0.0000 [IU] | Freq: Three times a day (TID) | SUBCUTANEOUS | Status: DC
  Administered 2020-02-17: 11 [IU] via SUBCUTANEOUS
  Administered 2020-02-18: 3 [IU] via SUBCUTANEOUS
  Administered 2020-02-18: 8 [IU] via SUBCUTANEOUS
  Administered 2020-02-18: 3 [IU] via SUBCUTANEOUS
  Administered 2020-02-19 (×2): 5 [IU] via SUBCUTANEOUS
  Administered 2020-02-19 – 2020-02-20 (×2): 3 [IU] via SUBCUTANEOUS
  Administered 2020-02-20: 5 [IU] via SUBCUTANEOUS
  Administered 2020-02-20: 17:00:00 3 [IU] via SUBCUTANEOUS
  Administered 2020-02-21: 5 [IU] via SUBCUTANEOUS
  Filled 2020-02-17 (×11): qty 1

## 2020-02-17 MED ORDER — MIDODRINE HCL 5 MG PO TABS
10.0000 mg | ORAL_TABLET | Freq: Once | ORAL | Status: AC
  Administered 2020-02-17: 10 mg via ORAL
  Filled 2020-02-17: qty 2

## 2020-02-17 MED ORDER — LORATADINE 10 MG PO TABS
10.0000 mg | ORAL_TABLET | Freq: Every day | ORAL | Status: DC
  Administered 2020-02-18 – 2020-02-21 (×4): 10 mg via ORAL
  Filled 2020-02-17 (×4): qty 1

## 2020-02-17 MED ORDER — VITAMIN D3 25 MCG (1000 UNIT) PO TABS
2000.0000 [IU] | ORAL_TABLET | Freq: Every day | ORAL | Status: DC
  Administered 2020-02-17 – 2020-02-21 (×5): 2000 [IU] via ORAL
  Filled 2020-02-17 (×9): qty 2

## 2020-02-17 MED ORDER — BUDESONIDE 0.5 MG/2ML IN SUSP
0.5000 mg | Freq: Two times a day (BID) | RESPIRATORY_TRACT | Status: DC
  Administered 2020-02-18 – 2020-02-21 (×6): 0.5 mg via RESPIRATORY_TRACT
  Filled 2020-02-17 (×8): qty 2

## 2020-02-17 MED ORDER — RIVAROXABAN 20 MG PO TABS
20.0000 mg | ORAL_TABLET | Freq: Every day | ORAL | Status: DC
  Administered 2020-02-17 – 2020-02-20 (×4): 20 mg via ORAL
  Filled 2020-02-17 (×5): qty 1

## 2020-02-17 MED ORDER — TRAZODONE HCL 50 MG PO TABS
150.0000 mg | ORAL_TABLET | Freq: Every day | ORAL | Status: DC
  Filled 2020-02-17: qty 3
  Filled 2020-02-17: qty 1
  Filled 2020-02-17: qty 3
  Filled 2020-02-17: qty 1

## 2020-02-17 MED ORDER — SENNOSIDES-DOCUSATE SODIUM 8.6-50 MG PO TABS
1.0000 | ORAL_TABLET | Freq: Every day | ORAL | Status: DC
  Administered 2020-02-17 – 2020-02-18 (×2): 1 via ORAL
  Filled 2020-02-17 (×4): qty 1

## 2020-02-17 MED ORDER — OXYCODONE HCL 5 MG PO TABS
10.0000 mg | ORAL_TABLET | Freq: Once | ORAL | Status: AC
Start: 2020-02-17 — End: 2020-02-17
  Administered 2020-02-17: 10 mg via ORAL
  Filled 2020-02-17: qty 2

## 2020-02-17 MED ORDER — UBROGEPANT 100 MG PO TABS
100.0000 mg | ORAL_TABLET | Freq: Every day | ORAL | Status: DC
Start: 2020-02-17 — End: 2020-02-22

## 2020-02-17 MED ORDER — DULOXETINE HCL 30 MG PO CPEP
60.0000 mg | ORAL_CAPSULE | Freq: Every day | ORAL | Status: DC
  Administered 2020-02-17 – 2020-02-21 (×5): 60 mg via ORAL
  Filled 2020-02-17 (×4): qty 2
  Filled 2020-02-17: qty 1

## 2020-02-17 MED ORDER — SODIUM CHLORIDE 0.9 % IV BOLUS
500.0000 mL | Freq: Once | INTRAVENOUS | Status: AC
  Administered 2020-02-17: 500 mL via INTRAVENOUS

## 2020-02-17 MED ORDER — ENSURE MAX PROTEIN PO LIQD
11.0000 [oz_av] | Freq: Two times a day (BID) | ORAL | Status: DC
  Filled 2020-02-17: qty 330

## 2020-02-17 MED ORDER — SALINE SPRAY 0.65 % NA SOLN
1.0000 | NASAL | Status: DC | PRN
  Filled 2020-02-17: qty 44

## 2020-02-17 NOTE — ED Provider Notes (Signed)
[facility] Emergency Department Provider Note   ____________________________________________   Event Date/Time   First MD Initiated Contact with Patient 02/17/20 1124     (approximate)  I have reviewed the triage vital signs and the nursing notes.   HISTORY  Chief Complaint Hypotension    HPI Gregory Hall is a 65 y.o. male with past medical history of hypertension, hyperlipidemia, CAD, COPD on 3 L, atrial flutter on Xarelto, CHF, and chronic neck and back pain who presents to the ED complaining of weakness.  Patient reports he has been increasingly weak with shaking in his extremities for the past month.  He deals with a chronic cough, but a couple of days ago this became productive of greenish sputum that "tastes like pneumonia."  This has been associated with worsening difficulty breathing despite wearing his usual 3 L nasal cannula.  He went to be seen at his pulmonologist office earlier today, where there was concern for COPD exacerbation and hypotension.  He was subsequently referred to the ED for further evaluation.  He denies any fevers, chest pain, abdominal pain, dysuria, or hematuria.  He has been feeling nauseous but has not vomited and he denies any diarrhea.  He does complain of ongoing chronic neck and back pain.  He also takes Midrin for hypotension regularly, but states he already took his morning dose.        Past Medical History:  Diagnosis Date  . AKI (acute kidney injury) (HCC) 09/12/2019  . CAD (coronary artery disease)    s/p PTCA and stent x2  . Chest pain   . CHF (congestive heart failure) (HCC)   . Chronic pain syndrome   . COPD (chronic obstructive pulmonary disease) (HCC)   . Degenerative cervical disc   . Depression   . Diabetes mellitus without complication (HCC)   . Dyslipidemia   . GERD (gastroesophageal reflux disease)   . Hernia 2014  . Hypertension   . MRSA (methicillin resistant staph aureus) culture  positive 2011  . Neuropathy   . Nutcracker esophagus   . Rectus diastasis 07/19/2012    Patient Active Problem List   Diagnosis Date Noted  . AKI (acute kidney injury) (HCC) 02/17/2020  . Acute respiratory failure (HCC) 12/13/2019  . Palliative care by specialist   . DNR (do not resuscitate)   . Acute CHF (congestive heart failure) (HCC) 10/08/2019  . Chronic respiratory failure with hypoxia (HCC) 10/08/2019  . Atrial flutter (HCC)   . Acute on chronic respiratory failure with hypoxia (HCC) 09/13/2019  . Acute diastolic CHF (congestive heart failure) (HCC) 09/12/2019  . NSTEMI (non-ST elevated myocardial infarction) (HCC) 09/12/2019  . Acute kidney failure, unspecified (HCC) 09/12/2019  . Diabetes mellitus type 2, uncontrolled, with complications (HCC) 06/10/2019  . Chronic pain syndrome 06/10/2019  . COPD exacerbation (HCC) 06/10/2019  . COPD with acute exacerbation (HCC) 06/09/2019  . CAD (coronary artery disease) 06/09/2019  . HTN (hypertension) 06/09/2019  . Acute on chronic respiratory failure with hypoxia and hypercapnia (HCC) 06/09/2019  . Anxiety 06/09/2019  . Chronic prescription opiate use 06/09/2019  . Hyperglycemia 06/09/2019  . Subclavian artery stenosis, left (HCC) 08/20/2014  . Rectus diastasis 07/19/2012  . Hernia   . Edema 05/10/2011  . Goals of care, counseling/discussion 10/06/2010  . SMOKER 11/25/2009  . CAROTID BRUIT, RIGHT 11/24/2009  . Chest pain 11/24/2009  . Hyperlipidemia 03/30/2009  . Coronary atherosclerosis 03/30/2009  . HYPERTENSION, HX OF 03/30/2009    Past Surgical History:  Procedure Laterality Date  . BACK SURGERY  2012  . CARDIOVERSION N/A 09/26/2019   Procedure: CARDIOVERSION;  Surgeon: Antonieta Iba, MD;  Location: [facility] ORS;  Service: Cardiovascular;  Laterality: N/A;  . CHOLECYSTECTOMY    . COLONOSCOPY  Jan 2014   [name]  . CORONARY ANGIOPLASTY WITH STENT PLACEMENT  2009   stents x2, in [place], [place]  . FOOT SURGERY      Right  . NECK SURGERY    . SPINE SURGERY  2012,2013  . TONSILLECTOMY      Prior to Admission medications   Medication Sig Start Date End Date Taking? Authorizing Provider  amiodarone (PACERONE) 200 MG tablet Take 1 tablet (200 mg total) by mouth daily. 11/03/19   Marisue Ivan D, PA-C  atorvastatin (LIPITOR) 40 MG tablet Take 1 tablet (40 mg total) by mouth daily. 09/28/19   Lewie Chamber, MD  baclofen (LIORESAL) 20 MG tablet Take 20 mg by mouth 3 (three) times daily.    [provider]  budesonide (PULMICORT) 0.5 MG/2ML nebulizer solution Take 0.5 mg by nebulization 2 (two) times daily.    [provider]  Cholecalciferol (VITAMIN D3) 50 MCG (2000 UT) TABS Take 2,000 Units by mouth daily.     [provider]  DULoxetine (CYMBALTA) 60 MG capsule Take 60 mg by mouth daily.    [provider]  Ensure Max Protein (ENSURE MAX PROTEIN) LIQD Take 330 mLs (11 oz total) by mouth 2 (two) times daily between meals. 06/14/19   Sheikh, Omair Latif, DO  fexofenadine (ALLEGRA) 180 MG tablet Take 180 mg by mouth daily. 01/23/20   [provider]  fluticasone (FLONASE) 50 MCG/ACT nasal spray Place 1 spray into both nostrils 2 (two) times daily. 12/15/19   Pennie Banter, DO  Fluticasone-Umeclidin-Vilant (TRELEGY ELLIPTA) 100-62.5-25 MCG/INH AEPB Inhale 1 puff into the lungs daily.  10/20/19   [provider]  glyBURIDE (DIABETA) 2.5 MG tablet Take 2.5 mg by mouth daily. 01/01/20   [provider]  insulin glargine (LANTUS) 100 UNIT/ML injection Inject 11 Units into the skin at bedtime.     [provider]  insulin lispro (HUMALOG) 100 UNIT/ML injection Inject 4 Units into the skin See admin instructions. Inject 4 units with blood sugar 150-250; inject 6 units 251-350; greater than 351 call provider; Hold for FSBG <150    [provider]  ipratropium-albuterol (DUONEB) 0.5-2.5 (3) MG/3ML SOLN Take 3 mLs by nebulization every 6  (six) hours as needed. Patient taking differently: Take 3 mLs by nebulization every 6 (six) hours as needed (wheezing). 06/14/19   Marguerita Merles Latif, DO  lamoTRIgine (LAMICTAL) 100 MG tablet Take 100 mg by mouth daily.    [provider]  LANTUS SOLOSTAR 100 UNIT/ML Solostar Pen Inject into the skin. 01/23/20   [provider]  magnesium oxide (MAG-OX) 400 (241.3 Mg) MG tablet Take 1 tablet by mouth daily. 12/24/19   [provider]  midodrine (PROAMATINE) 10 MG tablet Take 1 tablet (10 mg total) by mouth 2 (two) times daily. Take if top blood pressure reading is less than 100 02/16/20   Dunn, Ryan M, PA-C  NAC 600 MG CAPS Take 600 mg by mouth daily. 09/08/19   [provider]  NITROSTAT 0.4 MG SL tablet DISSOLVE (1) TABLET UNDER TONGUE AS NEEDED TO RELIEVE CHEST PAIN. MAYREPEAT EVERY 5 MINUTES. Patient taking differently: Place 0.4 mg under the tongue every 5 (five) minutes as needed for chest pain. 10/06/15  Iran Ouch, MD  Oxycodone HCl 10 MG TABS Take 1 tablet (10 mg total) by mouth 3 (three) times daily. Patient taking differently: Take 10 mg by mouth every 6 (six) hours. 09/14/19   Danford, Earl Lites, MD  oxymetazoline (AFRIN) 0.05 % nasal spray Place 2 sprays into both nostrils 2 (two) times daily as needed for congestion.    [provider]  pantoprazole (PROTONIX) 40 MG tablet Take 40 mg by mouth daily.     [provider]  predniSONE (DELTASONE) 2.5 MG tablet Take by mouth. 01/21/20   [provider]  pregabalin (LYRICA) 200 MG capsule Take 1 capsule (200 mg total) by mouth 3 (three) times daily. 09/14/19   Danford, Earl Lites, MD  rivaroxaban (XARELTO) 20 MG TABS tablet Take 20 mg by mouth daily with supper.    [provider]  roflumilast (DALIRESP) 500 MCG TABS tablet Take 250 mcg by mouth daily.    [provider]  senna-docusate (SENOKOT-S) 8.6-50 MG tablet Take 1 tablet by mouth daily.     [provider]  sodium chloride (OCEAN) 0.65 % SOLN nasal spray Place 1 spray into both nostrils as needed for congestion. 12/15/19   Pennie Banter, DO  tamsulosin (FLOMAX) 0.4 MG CAPS capsule Take 1 capsule (0.4 mg total) by mouth daily after supper. 10/10/19   Lynn Ito, MD  torsemide (DEMADEX) 20 MG tablet Take 2 tablets (40 mg total) by mouth daily. 01/21/20 04/20/20  Delma Freeze, FNP  traZODone (DESYREL) 150 MG tablet Take 150 mg by mouth at bedtime. 01/23/20   [provider]  Ubrogepant (UBRELVY) 100 MG TABS Take by mouth. 01/27/20   [provider]    Allergies Acetaminophen, Fentanyl, and Nsaids  Family History  Family history unknown: Yes    Social History Social History   Tobacco Use  . Smoking status: Former Smoker    Packs/day: 0.50    Years: 30.00    Pack years: 15.00    Types: Cigarettes    Quit date: 11/26/2019    Years since quitting: 0.2  . Smokeless tobacco: Never Used  Substance Use Topics  . Alcohol use: No  . Drug use: No    Review of Systems  Constitutional: No fever/chills.  Positive for generalized weakness. Eyes: No visual changes. ENT: No sore throat. Cardiovascular: Denies chest pain. Respiratory: Positive for cough and shortness of breath. Gastrointestinal: No abdominal pain.  Positive for nausea, no vomiting.  No diarrhea.  No constipation. Genitourinary: Negative for dysuria. Musculoskeletal: Negative for back pain. Skin: Negative for rash. Neurological: Negative for headaches, focal weakness or numbness.  ____________________________________________   PHYSICAL EXAM:  VITAL SIGNS: ED Triage Vitals  Enc Vitals Group     BP 02/17/20 1118 108/74     Pulse Rate 02/17/20 1118 65     Resp 02/17/20 1118 20     Temp 02/17/20 1118 97.7 F (36.5 C)     Temp Source 02/17/20 1118 Oral     SpO2 02/17/20 1118 94 %     Weight 02/17/20 1122 202 lb (91.6 kg)     Height 02/17/20 1122 6\' 2"  (1.88 m)     Head  Circumference --      Peak Flow --      Pain Score 02/17/20 1121 9     Pain Loc --      Pain Edu? --      Excl. in GC? --     Constitutional: Alert  and oriented. Eyes: Conjunctivae are normal. Head: Atraumatic. Nose: No congestion/rhinnorhea. Mouth/Throat: Mucous membranes are moist. Neck: Normal ROM Cardiovascular: Normal rate, regular rhythm. Grossly normal heart sounds. Respiratory: Tachypneic with mildly increased respiratory effort.  No retractions. Lungs with rhonchi throughout and expiratory wheezing. Gastrointestinal: Soft and nontender. No distention. Genitourinary: deferred Musculoskeletal: No lower extremity tenderness nor edema. Neurologic:  Normal speech and language. No gross focal neurologic deficits are appreciated. Skin:  Skin is warm, dry and intact. No rash noted. Psychiatric: Mood and affect are normal. Speech and behavior are normal.  ____________________________________________   LABS (all labs ordered are listed, but only abnormal results are displayed)  Labs Reviewed  CBC WITH DIFFERENTIAL/PLATELET - Abnormal; Notable for the following components:      Result Value   Hemoglobin 10.5 (*)    HCT 35.6 (*)    MCV 78.6 (*)    MCH 23.2 (*)    MCHC 29.5 (*)    RDW 20.3 (*)    All other components within normal limits  COMPREHENSIVE METABOLIC PANEL - Abnormal; Notable for the following components:   Potassium 3.3 (*)    Chloride 89 (*)    CO2 35 (*)    Glucose, Bld 196 (*)    BUN 24 (*)    Creatinine, Ser 3.20 (*)    Calcium 8.7 (*)    Albumin 3.1 (*)    AST 13 (*)    GFR, Estimated 20 (*)    All other components within normal limits  RESP PANEL BY RT-PCR (FLU A&B, COVID) [id number]  CULTURE, BLOOD (ROUTINE X 2)  CULTURE, BLOOD (ROUTINE X 2)  LACTIC ACID, PLASMA  LACTIC ACID, PLASMA  BRAIN NATRIURETIC PEPTIDE  URINALYSIS, COMPLETE (UACMP) WITH MICROSCOPIC  POC SARS CORONAVIRUS 2 AG -  ED  TROPONIN I (HIGH SENSITIVITY)  TROPONIN I (HIGH  SENSITIVITY)   ____________________________________________  EKG  ED ECG REPORT I, Chesley Noon, the attending physician, personally viewed and interpreted this ECG.   Date: 02/17/2020  EKG Time: 11:39  Rate: 59  Rhythm: normal sinus rhythm  Axis: Normal  Intervals:none  ST&T Change: None   PROCEDURES  Procedure(s) performed (including Critical Care):  Procedures   ____________________________________________   INITIAL IMPRESSION / ASSESSMENT AND PLAN / ED COURSE       65 year old male with past medical history of hypertension, hyperlipidemia, CAD, COPD on 3 L, atrial flutter on Xarelto, CHF, and chronic neck and back pain who presents to the ED with productive cough with increasing difficulty breathing over the past couple of days.  He is mildly tachypneic but not in any respiratory distress, maintaining O2 sats of greater than 90% on his usual 3 L nasal cannula.  He does have wheezing and rhonchi throughout, we will check chest x-ray and treat element of COPD exacerbation with steroids and albuterol inhaler for now.  We will need to hold off on nebulized breathing treatments given possibility of COVID-19, for which testing is pending.  Patient currently hypotensive despite taking his usual midodrine, but remains awake and alert and denies chest pain.  We will hydrate with IV fluids and initiate sepsis work-up.  Patient's blood pressure did not improve following initial liter of IV fluids.  He was given his usual dose of midodrine and hydrated with additional 1 L IV fluids, after which blood pressure seems to be normalizing.  COVID-19 testing is negative and patient reports breathing feels better following steroids and nebulized breathing treatments.  It is likely he was over diuresed  his lab work shows AKI, likely due to dehydration.  Given ongoing COPD exacerbation and AKI, case discussed with hospitalist for admission.       ____________________________________________   FINAL CLINICAL IMPRESSION(S) / ED DIAGNOSES  Final diagnoses:  COPD exacerbation (HCC)  AKI (acute kidney injury) (HCC)  Hypotension, unspecified hypotension type     ED Discharge Orders    None       Note:  This document was prepared using Dragon voice recognition software and may include unintentional dictation errors.   Chesley Noon, MD 02/17/20 [id number]

## 2020-02-17 NOTE — H&P (Addendum)
History and Physical    Gregory Hall ZHY:[id number] DOB: 09-18-1955 DOA: 02/17/2020  PCP: Koren Bound, NP   Patient coming from: Home  I have personally briefly reviewed patient's old medical records in [facility]  Chief Complaint: Shortness of breath  HPI: Gregory Hall is a 65 y.o. male with medical history significant for coronary artery disease status post PCI to the LAD, chronic diastolic dysfunction CHF, atrial flutter status post DC cardioversion on chronic anticoagulation with Xarelto, PAD status post left subclavian artery stenting, diabetes mellitus with complications of stage III chronic kidney disease, COPD with chronic respiratory failure on 3 L of oxygen, anxiety/depression, GERD who presents to the emergency room from his pulmonologist office for evaluation of a near syncopal episode.  The staff at [facility] clinic reported a blood pressure of 60/40.  Patient complained of feeling dizzy and lightheaded but denied having any falls or loss of consciousness. He also complains of worsening shortness of breath from his baseline associated with a cough productive of greenish yellow phlegm and diffuse wheezing.  Per patient he quit smoking about 2 months ago.  He complains of leg and back pain which is chronic and unchanged as well as worsening tremors in his upper extremities especially with activity. He denies having any chest pain, no palpitations, no diaphoresis, no abdominal pain, no nausea, no vomiting, no diarrhea, no constipation, no fever, no chills, no urinary frequency, no nocturia, no dysuria Labs show sodium 137, potassium 3.3, chloride 89, bicarb 35, BUN 24, creatinine 2.20, calcium 8.7, alkaline phosphatase 86, albumin 3.1, AST 13, ALT 8, total protein 6.8, BNP 43.9, troponin VI, lactic acid 1.3, white count 9.8, hemoglobin 10.5, hematocrit 35.6, MCV 78.6, RDW 20.3, platelet count 354 Respiratory viral panel is negative Chest x-ray reviewed by me shows  hyperinflated lungs Twelve-lead EKG shows sinus rhythm with nonspecific T wave changes    ED Course: Patient is a 65 year old male with a history of COPD with chronic respiratory failure on 3 L of oxygen who was sent to the ER from his pulmonologist office for evaluation of a near syncopal episode.  Patient was hypotensive in the office with a blood pressure of 60/40.  He complained of feeling dizzy and lightheaded but denied having any loss of consciousness or falls.  He also has worsening shortness of breath from his baseline associated with a cough productive of greenish-yellow phlegm and wheezing.  Chest x-ray does not show any acute findings.  Labs reveal acute kidney injury.  He will be admitted to the hospital for further evaluation.   Review of Systems: As per HPI otherwise all other systems reviewed and negative.    Past Medical History:  Diagnosis Date  . AKI (acute kidney injury) (HCC) 09/12/2019  . CAD (coronary artery disease)    s/p PTCA and stent x2  . Chest pain   . CHF (congestive heart failure) (HCC)   . Chronic pain syndrome   . COPD (chronic obstructive pulmonary disease) (HCC)   . Degenerative cervical disc   . Depression   . Diabetes mellitus without complication (HCC)   . Dyslipidemia   . GERD (gastroesophageal reflux disease)   . Hernia 2014  . Hypertension   . MRSA (methicillin resistant staph aureus) culture positive 2011  . Neuropathy   . Nutcracker esophagus   . Rectus diastasis 07/19/2012    Past Surgical History:  Procedure Laterality Date  . BACK SURGERY  2012  . CARDIOVERSION N/A 09/26/2019   Procedure: CARDIOVERSION;  Surgeon: Antonieta Iba, MD;  Location: [facility] ORS;  Service: Cardiovascular;  Laterality: N/A;  . CHOLECYSTECTOMY    . COLONOSCOPY  Jan 2014   [name]  . CORONARY ANGIOPLASTY WITH STENT PLACEMENT  2009   stents x2, in [place], [place]  . FOOT SURGERY     Right  . NECK SURGERY    . SPINE SURGERY  2012,2013  . TONSILLECTOMY        reports that he quit smoking about 2 months ago. His smoking use included cigarettes. He has a 15.00 pack-year smoking history. He has never used smokeless tobacco. He reports that he does not drink alcohol and does not use drugs.  Allergies  Allergen Reactions  . Acetaminophen Other (See Comments)    Reaction:  Unknown  Kidney failure Told not to take from home M.D. Related to kidney and renal failure   . Fentanyl   . Nsaids Other (See Comments)    Reaction:  Unknown  Kidney failure Patient states not to take from home M.D. Related to kidney and renal failure.  Other reaction(s): Unknown    Family History  Family history unknown: Yes      Prior to Admission medications   Medication Sig Start Date End Date Taking? Authorizing Provider  amiodarone (PACERONE) 200 MG tablet Take 1 tablet (200 mg total) by mouth daily. 11/03/19   Marisue Ivan D, PA-C  atorvastatin (LIPITOR) 40 MG tablet Take 1 tablet (40 mg total) by mouth daily. 09/28/19   Lewie Chamber, MD  baclofen (LIORESAL) 20 MG tablet Take 20 mg by mouth 3 (three) times daily.    [provider]  budesonide (PULMICORT) 0.5 MG/2ML nebulizer solution Take 0.5 mg by nebulization 2 (two) times daily.    [provider]  Cholecalciferol (VITAMIN D3) 50 MCG (2000 UT) TABS Take 2,000 Units by mouth daily.     [provider]  DULoxetine (CYMBALTA) 60 MG capsule Take 60 mg by mouth daily.    [provider]  Ensure Max Protein (ENSURE MAX PROTEIN) LIQD Take 330 mLs (11 oz total) by mouth 2 (two) times daily between meals. 06/14/19   Sheikh, Omair Latif, DO  fexofenadine (ALLEGRA) 180 MG tablet Take 180 mg by mouth daily. 01/23/20   [provider]  fluticasone (FLONASE) 50 MCG/ACT nasal spray Place 1 spray into both nostrils 2 (two) times daily. 12/15/19   Pennie Banter, DO  Fluticasone-Umeclidin-Vilant (TRELEGY ELLIPTA) 100-62.5-25 MCG/INH AEPB Inhale 1 puff into the lungs daily.   10/20/19   [provider]  glyBURIDE (DIABETA) 2.5 MG tablet Take 2.5 mg by mouth daily. 01/01/20   [provider]  insulin glargine (LANTUS) 100 UNIT/ML injection Inject 11 Units into the skin at bedtime.     [provider]  insulin lispro (HUMALOG) 100 UNIT/ML injection Inject 4 Units into the skin See admin instructions. Inject 4 units with blood sugar 150-250; inject 6 units 251-350; greater than 351 call provider; Hold for FSBG <150    [provider]  ipratropium-albuterol (DUONEB) 0.5-2.5 (3) MG/3ML SOLN Take 3 mLs by nebulization every 6 (six) hours as needed. Patient taking differently: Take 3 mLs by nebulization every 6 (six) hours as needed (wheezing). 06/14/19   Marguerita Merles Latif, DO  lamoTRIgine (LAMICTAL) 100 MG tablet Take 100 mg by mouth daily.    [provider]  LANTUS SOLOSTAR 100 UNIT/ML Solostar Pen Inject into the skin. 01/23/20   [provider]  magnesium oxide (MAG-OX) 400 (241.3 Mg)  MG tablet Take 1 tablet by mouth daily. 12/24/19   [provider]  midodrine (PROAMATINE) 10 MG tablet Take 1 tablet (10 mg total) by mouth 2 (two) times daily. Take if top blood pressure reading is less than 100 02/16/20   Dunn, Ryan M, PA-C  NAC 600 MG CAPS Take 600 mg by mouth daily. 09/08/19   [provider]  NITROSTAT 0.4 MG SL tablet DISSOLVE (1) TABLET UNDER TONGUE AS NEEDED TO RELIEVE CHEST PAIN. MAYREPEAT EVERY 5 MINUTES. Patient taking differently: Place 0.4 mg under the tongue every 5 (five) minutes as needed for chest pain. 10/06/15   Iran Ouch, MD  Oxycodone HCl 10 MG TABS Take 1 tablet (10 mg total) by mouth 3 (three) times daily. Patient taking differently: Take 10 mg by mouth every 6 (six) hours. 09/14/19   Danford, Earl Lites, MD  oxymetazoline (AFRIN) 0.05 % nasal spray Place 2 sprays into both nostrils 2 (two) times daily as needed for congestion.    [provider]  pantoprazole  (PROTONIX) 40 MG tablet Take 40 mg by mouth daily.     [provider]  predniSONE (DELTASONE) 2.5 MG tablet Take by mouth. 01/21/20   [provider]  pregabalin (LYRICA) 200 MG capsule Take 1 capsule (200 mg total) by mouth 3 (three) times daily. 09/14/19   Danford, Earl Lites, MD  rivaroxaban (XARELTO) 20 MG TABS tablet Take 20 mg by mouth daily with supper.    [provider]  roflumilast (DALIRESP) 500 MCG TABS tablet Take 250 mcg by mouth daily.    [provider]  senna-docusate (SENOKOT-S) 8.6-50 MG tablet Take 1 tablet by mouth daily.     [provider]  sodium chloride (OCEAN) 0.65 % SOLN nasal spray Place 1 spray into both nostrils as needed for congestion. 12/15/19   Pennie Banter, DO  tamsulosin (FLOMAX) 0.4 MG CAPS capsule Take 1 capsule (0.4 mg total) by mouth daily after supper. 10/10/19   Lynn Ito, MD  torsemide (DEMADEX) 20 MG tablet Take 2 tablets (40 mg total) by mouth daily. 01/21/20 04/20/20  Delma Freeze, FNP  traZODone (DESYREL) 150 MG tablet Take 150 mg by mouth at bedtime. 01/23/20   [provider]  Ubrogepant (UBRELVY) 100 MG TABS Take by mouth. 01/27/20   [provider]    Physical Exam: Vitals:   02/17/20 1400 02/17/20 1430 02/17/20 1500 02/17/20 1530  BP: (!) 91/50 (!) 93/55 134/67 128/62  Pulse: 60 (!) 59 70 81  Resp: (!) 9 13 10 16   Temp:      TempSrc:      SpO2: 91% 97% 97% 90%  Weight:      Height:         Vitals:   02/17/20 1400 02/17/20 1430 02/17/20 1500 02/17/20 1530  BP: (!) 91/50 (!) 93/55 134/67 128/62  Pulse: 60 (!) 59 70 81  Resp: (!) 9 13 10 16   Temp:      TempSrc:      SpO2: 91% 97% 97% 90%  Weight:      Height:          Constitutional: Alert and oriented x 3 . Not in any apparent distress.  Chronically ill-appearing HEENT:      Head: Normocephalic and atraumatic.         Eyes: PERLA, EOMI, Conjunctivae are normal. Sclera is non-icteric.       Mouth/Throat:  Mucous membranes are moist.  Neck: Supple with no signs of meningismus. Cardiovascular: Regular rate and rhythm. No murmurs, gallops, or rubs. 2+ symmetrical distal pulses are present . No JVD. No LE edema Respiratory: Respiratory effort normal.  Rhonchi in both lung fields bilaterally.  Scattered wheezes, no crackles Gastrointestinal: Soft, non tender, and non distended with positive bowel sounds.  Genitourinary: No CVA tenderness. Musculoskeletal: Nontender with normal range of motion in all extremities. No cyanosis, or erythema of extremities. Neurologic:  Face is symmetric. Moving all extremities. No gross focal neurologic deficits  Skin: Skin is warm, dry.  No rash or ulcers Psychiatric: Mood and affect are normal   Labs on Admission: I have personally reviewed following labs and imaging studies  CBC: Recent Labs  Lab 02/17/20 1149  WBC 9.8  NEUTROABS 7.7  HGB 10.5*  HCT 35.6*  MCV 78.6*  PLT 354   Basic Metabolic Panel: Recent Labs  Lab 02/17/20 1149  NA 137  K 3.3*  CL 89*  CO2 35*  GLUCOSE 196*  BUN 24*  CREATININE 3.20*  CALCIUM 8.7*   GFR: Estimated Creatinine Clearance: 26 mL/min (A) (by C-G formula based on SCr of 3.2 mg/dL (H)). Liver Function Tests: Recent Labs  Lab 02/17/20 1149  AST 13*  ALT 8  ALKPHOS 86  BILITOT 0.5  PROT 6.8  ALBUMIN 3.1*   No results for input(s): LIPASE, AMYLASE in the last 168 hours. No results for input(s): AMMONIA in the last 168 hours. Coagulation Profile: No results for input(s): INR, PROTIME in the last 168 hours. Cardiac Enzymes: No results for input(s): CKTOTAL, CKMB, CKMBINDEX, TROPONINI in the last 168 hours. BNP (last 3 results) No results for input(s): PROBNP in the last 8760 hours. HbA1C: No results for input(s): HGBA1C in the last 72 hours. CBG: No results for input(s): GLUCAP in the last 168 hours. Lipid Profile: No results for input(s): CHOL, HDL, LDLCALC, TRIG, CHOLHDL, LDLDIRECT in the last 72  hours. Thyroid Function Tests: No results for input(s): TSH, T4TOTAL, FREET4, T3FREE, THYROIDAB in the last 72 hours. Anemia Panel: No results for input(s): VITAMINB12, FOLATE, FERRITIN, TIBC, IRON, RETICCTPCT in the last 72 hours. Urine analysis:    Component Value Date/Time   COLORURINE YELLOW (A) 12/13/2019 0042   APPEARANCEUR CLEAR (A) 12/13/2019 0042   APPEARANCEUR Clear 10/28/2013 2215   LABSPEC 1.015 12/13/2019 0042   LABSPEC 1.002 10/28/2013 2215   PHURINE 5.0 12/13/2019 0042   GLUCOSEU NEGATIVE 12/13/2019 0042   GLUCOSEU Negative 10/28/2013 2215   HGBUR NEGATIVE 12/13/2019 0042   BILIRUBINUR NEGATIVE 12/13/2019 0042   BILIRUBINUR Negative 10/28/2013 2215   KETONESUR NEGATIVE 12/13/2019 0042   PROTEINUR NEGATIVE 12/13/2019 0042   UROBILINOGEN 1.0 03/29/2009 0840   NITRITE NEGATIVE 12/13/2019 0042   LEUKOCYTESUR NEGATIVE 12/13/2019 0042   LEUKOCYTESUR Negative 10/28/2013 2215    Radiological Exams on Admission: DG Chest Portable 1 View  Result Date: 02/17/2020 CLINICAL DATA:  Shortness of breath EXAM: PORTABLE CHEST 1 VIEW COMPARISON:  12/13/2018 FINDINGS: Cardiac shadow is within normal limits. Aortic calcifications are seen. Left subclavian arterial stenting is noted. Spinal stimulator is seen. The lungs are well aerated bilaterally. Mild patchy bibasilar atelectatic changes are seen. No bony abnormality is seen. IMPRESSION: Mild basilar atelectasis Electronically Signed   By: Alcide Clever M.D.   On: 02/17/2020 11:57     Assessment/Plan Principal Problem:   AKI (acute kidney injury) (HCC) Active Problems:   COPD with acute exacerbation (HCC)   CAD (coronary artery disease)   Atrial flutter (HCC)  Chronic respiratory failure with hypoxia (HCC)   Diabetes mellitus without complication (HCC)   Diastolic dysfunction with chronic heart failure (HCC)   Near syncope     Acute kidney injury At baseline patient has a serum creatinine of 0.95 and today on admission it  is 3.20 Acute kidney injury appears to be secondary to ATN from hypotension We will hold diuretics at this time Judicious IV fluid administration and monitor respiratory status closely We will obtain renal stone CT to rule out obstruction as the cause of his acute kidney injury We will request nephrology consult if there is no improvement in his renal function   Near syncope Secondary to hypotension Patient had a blood pressure of 60/40 upon arrival to the ER and responded to IV fluid resuscitation with improvement in his blood pressure Continue midodrine Hold torsemide   COPD with acute exacerbation on chronic respiratory failure Patient presents for evaluation of worsening shortness of breath from his baseline he has a cough productive of yellowish-green phlegm He is also on chronic steroid therapy  We will place patient on cefepime 2 g IV every 8 hours Follow-up results of sputum culture Continue as needed bronchodilator therapy Continue inhaled and systemic steroids Patient is on 3 L of oxygen continuous     History of atrial flutter Continue amiodarone Continue Xarelto as primary prophylaxis for an acute stroke   BPH Continue Flomax   History of depression and anxiety Continue trazodone and duloxetine    Diabetes mellitus Maintain consistent carbohydrate diet Glycemic control with sliding scale insulin Accu-Cheks before meals and at bedtime   DVT prophylaxis: Lovenox Code Status: full code Family Communication: Greater than 50% of time was spent discussing patient's condition and plan of care with him at the bedside.  All questions and concerns have been addressed.  He verbalizes understanding and agrees with the plan.  He lists his sister Charletta Cousin as his healthcare power of attorney.  CODE STATUS was discussed and he is a full code. Disposition Plan: Back to previous home environment Consults called: none Status: Inpatient.  The medical decision making for  this patient was of high complexity and the patient is at high risk of clinical deterioration during his hospitalization.    Lucile Shutters MD [facility]     02/17/2020, 4:40 PM

## 2020-02-17 NOTE — ED Triage Notes (Addendum)
Arrives from [facility].  Patient had a regular appointment today and [facility] staff report patient BP 60/40 and patient was near syncopal.  Patient arrives to ED, AAOx3.  Skin warm and dry. MAE equally and strong. NAD. ONly complaint is of chronic pain.  Patient reports shaking to upper extremities is new x 1 month.

## 2020-02-18 ENCOUNTER — Inpatient Hospital Stay: Payer: Medicare Other

## 2020-02-18 DIAGNOSIS — N179 Acute kidney failure, unspecified: Secondary | ICD-10-CM | POA: Diagnosis not present

## 2020-02-18 LAB — CBC
HCT: 32.9 % — ABNORMAL LOW (ref 39.0–52.0)
Hemoglobin: 9.8 g/dL — ABNORMAL LOW (ref 13.0–17.0)
MCH: 23.2 pg — ABNORMAL LOW (ref 26.0–34.0)
MCHC: 29.8 g/dL — ABNORMAL LOW (ref 30.0–36.0)
MCV: 77.8 fL — ABNORMAL LOW (ref 80.0–100.0)
Platelets: 333 10*3/uL (ref 150–400)
RBC: 4.23 MIL/uL (ref 4.22–5.81)
RDW: 20 % — ABNORMAL HIGH (ref 11.5–15.5)
WBC: 13 10*3/uL — ABNORMAL HIGH (ref 4.0–10.5)
nRBC: 0 % (ref 0.0–0.2)

## 2020-02-18 LAB — BASIC METABOLIC PANEL
Anion gap: 14 (ref 5–15)
BUN: 25 mg/dL — ABNORMAL HIGH (ref 8–23)
CO2: 32 mmol/L (ref 22–32)
Calcium: 8.5 mg/dL — ABNORMAL LOW (ref 8.9–10.3)
Chloride: 91 mmol/L — ABNORMAL LOW (ref 98–111)
Creatinine, Ser: 2.03 mg/dL — ABNORMAL HIGH (ref 0.61–1.24)
GFR, Estimated: 35 mL/min — ABNORMAL LOW (ref >60)
Glucose, Bld: 162 mg/dL — ABNORMAL HIGH (ref 70–99)
Potassium: 4.5 mmol/L (ref 3.5–5.1)
Sodium: 137 mmol/L (ref 135–145)

## 2020-02-18 LAB — GLUCOSE, CAPILLARY
Glucose-Capillary: 184 mg/dL — ABNORMAL HIGH (ref 70–99)
Glucose-Capillary: 200 mg/dL — ABNORMAL HIGH (ref 70–99)
Glucose-Capillary: 256 mg/dL — ABNORMAL HIGH (ref 70–99)
Glucose-Capillary: 175 mg/dL — ABNORMAL HIGH (ref 70–99)

## 2020-02-18 LAB — EXPECTORATED SPUTUM ASSESSMENT W GRAM STAIN, RFLX TO RESP C

## 2020-02-18 LAB — HEMOGLOBIN A1C
Hgb A1c MFr Bld: 7.9 % — ABNORMAL HIGH (ref 4.8–5.6)
Mean Plasma Glucose: 180.03 mg/dL

## 2020-02-18 LAB — PROCALCITONIN: Procalcitonin: <0.1 ng/mL

## 2020-02-18 LAB — HIV ANTIBODY (ROUTINE TESTING W REFLEX): HIV Screen 4th Generation wRfx: NONREACTIVE

## 2020-02-18 MED ORDER — SODIUM CHLORIDE 0.9 % IV SOLN
2.0000 g | Freq: Two times a day (BID) | INTRAVENOUS | Status: DC
  Filled 2020-02-18 (×2): qty 2

## 2020-02-18 MED ORDER — DIPHENHYDRAMINE HCL 50 MG/ML IJ SOLN
25.0000 mg | Freq: Three times a day (TID) | INTRAMUSCULAR | Status: DC
  Administered 2020-02-18: 25 mg via INTRAVENOUS
  Filled 2020-02-18: qty 1

## 2020-02-18 MED ORDER — MIDODRINE HCL 5 MG PO TABS
10.0000 mg | ORAL_TABLET | Freq: Three times a day (TID) | ORAL | Status: DC
  Administered 2020-02-18 – 2020-02-21 (×7): 10 mg via ORAL
  Filled 2020-02-18 (×7): qty 2

## 2020-02-18 MED ORDER — LEVOFLOXACIN 750 MG PO TABS
750.0000 mg | ORAL_TABLET | ORAL | Status: DC
  Administered 2020-02-18: 750 mg via ORAL
  Filled 2020-02-18: qty 2

## 2020-02-18 MED ORDER — HYDROMORPHONE HCL 1 MG/ML IJ SOLN
1.0000 mg | INTRAMUSCULAR | Status: DC | PRN
  Administered 2020-02-18 – 2020-02-21 (×19): 1 mg via INTRAVENOUS
  Filled 2020-02-18 (×19): qty 1

## 2020-02-18 MED ORDER — HYDROMORPHONE HCL 2 MG PO TABS
2.0000 mg | ORAL_TABLET | Freq: Four times a day (QID) | ORAL | Status: DC | PRN
  Administered 2020-02-18: 2 mg via ORAL
  Filled 2020-02-18: qty 1

## 2020-02-18 MED ORDER — FUROSEMIDE 10 MG/ML IJ SOLN
40.0000 mg | Freq: Every day | INTRAMUSCULAR | Status: DC
  Administered 2020-02-18 – 2020-02-19 (×2): 40 mg via INTRAVENOUS
  Filled 2020-02-18 (×3): qty 4

## 2020-02-18 MED ORDER — METHYLPREDNISOLONE SODIUM SUCC 40 MG IJ SOLR
40.0000 mg | Freq: Two times a day (BID) | INTRAMUSCULAR | Status: DC
  Administered 2020-02-18 – 2020-02-19 (×2): 40 mg via INTRAVENOUS
  Filled 2020-02-18 (×2): qty 1

## 2020-02-18 MED ORDER — SODIUM CHLORIDE 0.9 % IV SOLN: 2.0000 g | Freq: Three times a day (TID) | INTRAVENOUS | Status: DC

## 2020-02-18 NOTE — Progress Notes (Signed)
PROGRESS NOTE    Gregory Hall  XBJ:[id number] DOB: 1955-11-23 DOA: 02/17/2020 PCP: Koren Bound, NP   Brief Narrative: Taken from H&P Gregory Hall is a 65 y.o. male with medical history significant for coronary artery disease status post PCI to the LAD, chronic diastolic dysfunction CHF, atrial flutter status post DC cardioversion on chronic anticoagulation with Xarelto, PAD status post left subclavian artery stenting, diabetes mellitus with complications of stage III chronic kidney disease, COPD with chronic respiratory failure on 3 L of oxygen, anxiety/depression, GERD who presents to the emergency room from his pulmonologist office for evaluation of a near syncopal episode.  The staff at [facility] reported a blood pressure of 60/40.  Patient complained of feeling dizzy and lightheaded but denied having any falls or loss of consciousness. Labs within normal limit except AKI and x-ray with hyperinflated lungs and no new infiltrate.  Subjective: Patient was complaining of generalized pain, stating that he has chronic pain syndrome and his home dose of foxy is not working.  Assessment & Plan:   Principal Problem:   AKI (acute kidney injury) (HCC) Active Problems:   COPD with acute exacerbation (HCC)   CAD (coronary artery disease)   Atrial flutter (HCC)   Chronic respiratory failure with hypoxia (HCC)   Diabetes mellitus without complication (HCC)   Diastolic dysfunction with chronic heart failure (HCC)   Near syncope  AKI.  Some improvement in creatinine with IV fluid.  Baseline less than 1.  Creatinine at 2.03 today.  Renal CT without hydronephrosis or nephrolithiasis. -Monitor renal function -Continue with gentle hydration -Avoid nephrotoxins  Near syncope.  Most likely secondary to hypotension.  Blood pressure responded to fluid resuscitation. -Continue with midodrine -Keep holding torsemide  Chronic respiratory failure/COPD exacerbation.  Saturating in  low 90s on his home oxygen requirement of 3 L.  Was having some expiratory wheeze. He was placed on cefepime for concern of pneumonia. Pulmonary was consulted-appreciate their recommendations. -Switch cefepime with p.o. Levaquin. -Continue with steroid -Continue with bronchodilators -Continue with supportive care -Continue with supplemental oxygen to keep the saturation above 90%  Chronic pain syndrome.  Patient with uncontrolled pain on his home regimen of oxycodone. -Add Dilaudid and monitor  History of atrial flutter. -Continue with amiodarone and Xarelto  BPH. -Continue with Flomax  History of depression and anxiety. -Continue with home dose of trazodone and duloxetine  Diet controlled diabetes mellitus. -Continue to monitor -As needed SSI  Objective: Vitals:   02/18/20 0436 02/18/20 0842 02/18/20 1321 02/18/20 1625  BP: (!) 112/58 (!) 119/49  (!) 124/50  Pulse: 65 69  72  Resp: Temp: 97.6 F (36.4 C) 98.2 F (36.8 C)  98.2 F (36.8 C)  TempSrc: Oral Oral    SpO2: 93% 93% 91% 92%  Weight:      Height:        Intake/Output Summary (Last 24 hours) at 02/18/2020 1727 Last data filed at 02/18/2020 1623 Gross per 24 hour  Intake 1647.75 ml  Output 1825 ml  Net -177.25 ml   Filed Weights   02/17/20 1122 02/18/20 0341  Weight: 91.6 kg 96 kg    Examination:  General exam: Appears calm and comfortable  Respiratory system: Bilateral scattered wheeze. Respiratory effort normal. Cardiovascular system: S1 & S2 heard, RRR.  Gastrointestinal system: Soft, nontender, nondistended, bowel sounds positive. Central nervous system: Alert and oriented. No focal neurological deficits. Extremities: No edema, no cyanosis, pulses intact and symmetrical. Psychiatry: Judgement and  insight appear normal. Mood & affect appropriate.    DVT prophylaxis: Xarelto Code Status: Full Family Communication: Discussed with patient Disposition Plan:  Status is:  Inpatient  Remains inpatient appropriate because:Inpatient level of care appropriate due to severity of illness   Dispo: The patient is from: Group home              Anticipated d/c is to: Group home              Anticipated d/c date is: 1 day              Patient currently is not medically stable to d/c.   Difficult to place patient No               Level of care: Progressive Cardiac  Consultants:   Pulmonology  Procedures:  Antimicrobials:  Levaquin  Data Reviewed: I have personally reviewed following labs and imaging studies  CBC: Recent Labs  Lab 02/17/20 1149 02/18/20 0550  WBC 9.8 13.0*  NEUTROABS 7.7  --   HGB 10.5* 9.8*  HCT 35.6* 32.9*  MCV 78.6* 77.8*  PLT 354 333   Basic Metabolic Panel: Recent Labs  Lab 02/17/20 1149 02/18/20 0550  NA 137 137  K 3.3* 4.5  CL 89* 91*  CO2 35* 32  GLUCOSE 196* 162*  BUN 24* 25*  CREATININE 3.20* 2.03*  CALCIUM 8.7* 8.5*   GFR: Estimated Creatinine Clearance: 41.1 mL/min (A) (by C-G formula based on SCr of 2.03 mg/dL (H)). Liver Function Tests: Recent Labs  Lab 02/17/20 1149  AST 13*  ALT 8  ALKPHOS 86  BILITOT 0.5  PROT 6.8  ALBUMIN 3.1*   No results for input(s): LIPASE, AMYLASE in the last 168 hours. No results for input(s): AMMONIA in the last 168 hours. Coagulation Profile: No results for input(s): INR, PROTIME in the last 168 hours. Cardiac Enzymes: No results for input(s): CKTOTAL, CKMB, CKMBINDEX, TROPONINI in the last 168 hours. BNP (last 3 results) No results for input(s): PROBNP in the last 8760 hours. HbA1C: Recent Labs    02/18/20 0550  HGBA1C 7.9*   CBG: Recent Labs  Lab 02/17/20 1829 02/18/20 0845 02/18/20 1155 02/18/20 1639  GLUCAP 369* 184* 200* 256*   Lipid Profile: No results for input(s): CHOL, HDL, LDLCALC, TRIG, CHOLHDL, LDLDIRECT in the last 72 hours. Thyroid Function Tests: No results for input(s): TSH, T4TOTAL, FREET4, T3FREE, THYROIDAB in the last 72  hours. Anemia Panel: No results for input(s): VITAMINB12, FOLATE, FERRITIN, TIBC, IRON, RETICCTPCT in the last 72 hours. Sepsis Labs: Recent Labs  Lab 02/17/20 1149 02/17/20 1400  LATICACIDVEN 1.8 1.3    Recent Results (from the past 240 hour(s))  Culture, blood (routine x 2)     Status: None (Preliminary result)   Collection Time: 02/17/20 11:48 AM   Specimen: BLOOD  Result Value Ref Range Status   Specimen Description BLOOD RIGHT FA  Final   Special Requests   Final    BOTTLES DRAWN AEROBIC AND ANAEROBIC Blood Culture adequate volume   Culture   Final    NO GROWTH < 24 HOURS Performed at [facility], [address] [place], [place] [address]    Report Status PENDING  Incomplete  Culture, blood (routine x 2)     Status: None (Preliminary result)   Collection Time: 02/17/20 12:10 PM   Specimen: BLOOD  Result Value Ref Range Status   Specimen Description BLOOD LEFT [facility]  Final   Special Requests  Final    BOTTLES DRAWN AEROBIC AND ANAEROBIC Blood Culture adequate volume   Culture   Final    NO GROWTH < 24 HOURS Performed at [facility], [address] [place], [place] [address]    Report Status PENDING  Incomplete  Resp Panel by RT-PCR (Flu A&B, Covid) Nasopharyngeal Swab     Status: None   Collection Time: 02/17/20 12:39 PM   Specimen: Nasopharyngeal Swab; Nasopharyngeal(NP) swabs in vial transport medium  Result Value Ref Range Status   SARS Coronavirus 2 by RT PCR NEGATIVE NEGATIVE Final    Comment: (NOTE) SARS-CoV-2 target nucleic acids are NOT DETECTED.  The SARS-CoV-2 RNA is generally detectable in upper respiratory specimens during the acute phase of infection. The lowest concentration of SARS-CoV-2 viral copies this assay can detect is 138 copies/mL. A negative result does not preclude SARS-Cov-2 infection and should not be used as the sole basis for treatment or other patient management decisions. A negative result may occur with   improper specimen collection/handling, submission of specimen other than nasopharyngeal swab, presence of viral mutation(s) within the areas targeted by this assay, and inadequate number of viral copies(<138 copies/mL). A negative result must be combined with clinical observations, patient history, and epidemiological information. The expected result is Negative.  Fact Sheet for Patients:  BloggerCourse.com  Fact Sheet for Healthcare Providers:  SeriousBroker.it  This test is no t yet approved or cleared by the [place] FDA and  has been authorized for detection and/or diagnosis of SARS-CoV-2 by FDA under an Emergency Use Authorization (EUA). This EUA will remain  in effect (meaning this test can be used) for the duration of the COVID-19 declaration under Section 564(b)(1) of the Act, 21 U.S.C.section 360bbb-3(b)(1), unless the authorization is terminated  or revoked sooner.       Influenza A by PCR NEGATIVE NEGATIVE Final   Influenza B by PCR NEGATIVE NEGATIVE Final    Comment: (NOTE) The Xpert Xpress SARS-CoV-2/FLU/RSV plus assay is intended as an aid in the diagnosis of influenza from Nasopharyngeal swab specimens and should not be used as a sole basis for treatment. Nasal washings and aspirates are unacceptable for Xpert Xpress SARS-CoV-2/FLU/RSV testing.  Fact Sheet for Patients: BloggerCourse.com  Fact Sheet for Healthcare Providers: SeriousBroker.it  This test is not yet approved or cleared by the [place] FDA and has been authorized for detection and/or diagnosis of SARS-CoV-2 by FDA under an Emergency Use Authorization (EUA). This EUA will remain in effect (meaning this test can be used) for the duration of the COVID-19 declaration under Section 564(b)(1) of the Act, 21 U.S.C. section 360bbb-3(b)(1), unless the authorization is terminated  or revoked.  Performed at [facility], [address], [place], [place] [address]      Radiology Studies: DG Chest [place] 1 View  Result Date: 02/18/2020 CLINICAL DATA:  Cough and shortness of breath EXAM: PORTABLE CHEST 1 VIEW COMPARISON:  February 17, 2020 FINDINGS: There are areas of atelectatic change in each lung base. There is no evident edema or airspace opacity. Heart size and pulmonary vascularity are normal. No adenopathy. There is aortic atherosclerosis. Thoracic stimulator leads are present in the midthoracic region, stable. There is postoperative change in the lower cervical region. There is a stent in the left innominate vein region, stable. IMPRESSION: Bibasilar atelectasis. No edema or airspace opacity. Stable cardiac silhouette. Aortic Atherosclerosis (ICD10-I70.0). Electronically Signed   By: Bretta Bang III M.D.   On: 02/18/2020 15:22   DG Chest  Portable 1 View  Result Date: 02/17/2020 CLINICAL DATA:  Shortness of breath EXAM: PORTABLE CHEST 1 VIEW COMPARISON:  12/13/2018 FINDINGS: Cardiac shadow is within normal limits. Aortic calcifications are seen. Left subclavian arterial stenting is noted. Spinal stimulator is seen. The lungs are well aerated bilaterally. Mild patchy bibasilar atelectatic changes are seen. No bony abnormality is seen. IMPRESSION: Mild basilar atelectasis Electronically Signed   By: Alcide Clever M.D.   On: 02/17/2020 11:57   CT RENAL STONE STUDY  Result Date: 02/17/2020 CLINICAL DATA:  Acute renal failure. EXAM: CT ABDOMEN AND PELVIS WITHOUT CONTRAST TECHNIQUE: Multidetector CT imaging of the abdomen and pelvis was performed following the standard protocol without IV contrast. COMPARISON:  CT abdomen pelvis June 24, 2012 and lumbar spine CT May 25, 2019. FINDINGS: Lower chest: Bibasilar consolidative opacities. Normal size heart. No pericardial effusion. Coronary artery calcifications. Hepatobiliary: No suspicious hepatic lesion. Cholecystectomy.  No biliary ductal dilatation. Pancreas: Unremarkable Spleen: Unremarkable Adrenals/Urinary Tract: Bilateral adrenal glands are unremarkable. No hydronephrosis. No nephrolithiasis. Bladder is unremarkable. Stomach/Bowel: Stomach is distended without evidence of wall thickening. No suspicious small bowel wall thickening or dilation. Normal appendix. Redundant sigmoid colon. Moderate volume of formed stool throughout the colon. Colonic diverticulosis without findings of acute diverticulosis. Vascular/Lymphatic: Aortic atherosclerosis. No enlarged abdominal or pelvic lymph nodes. Reproductive: Prostate is unremarkable. Other: No abdominopelvic ascites. Musculoskeletal: L4-L5 posterior lumbar fusion and disc spacer. No significant change in the L1 superior endplate compression deformity. IMPRESSION: 1. No hydronephrosis. 2. Bibasilar consolidative opacities, which could reflect atelectasis or pneumonia. 3. Colonic diverticulosis without findings of acute diverticulosis. 4. Moderate volume of formed stool throughout the colon. 5. Aortic atherosclerosis. Aortic Atherosclerosis (ICD10-I70.0). Electronically Signed   By: Maudry Mayhew MD   On: 02/17/2020 17:24    Scheduled Meds: . amiodarone  200 mg Oral Daily  . atorvastatin  40 mg Oral QHS  . budesonide  0.5 mg Nebulization BID  . cholecalciferol  2,000 Units Oral Daily  . diphenhydrAMINE  25 mg Intravenous TID  . DULoxetine  60 mg Oral Daily  . fluticasone  1 spray Each Nare BID  . furosemide  40 mg Intravenous Daily  . insulin aspart  0-15 Units Subcutaneous TID WC  . lamoTRIgine  100 mg Oral Daily  . levofloxacin  750 mg Oral Q48H  . loratadine  10 mg Oral Daily  . magnesium oxide  400 mg Oral Daily  . methylPREDNISolone (SOLU-MEDROL) injection  40 mg Intravenous Q12H  . midodrine  10 mg Oral TID  . oxyCODONE  10 mg Oral Q6H  . pantoprazole  40 mg Oral Daily  . pregabalin  200 mg Oral Q8H  . Ensure Max Protein  11 oz Oral BID BM  . rivaroxaban   20 mg Oral Q supper  . roflumilast  250 mcg Oral Daily  . senna-docusate  1 tablet Oral Daily  . tamsulosin  0.4 mg Oral QPC supper  . traZODone  150 mg Oral QHS  . Ubrogepant  100 mg Oral Daily   Continuous Infusions:   LOS: 1 day   Time spent: 40 minutes  Arnetha Courser, MD [facility]  If 7PM-7AM, please contact night-coverage [email or web address]  02/18/2020, 5:27 PM   This record has been created using Conservation officer, historic buildings. Errors have been sought and corrected,but may not always be located. Such creation errors do not reflect on the standard of care.

## 2020-02-18 NOTE — Progress Notes (Signed)
Initial Nutrition Assessment  DOCUMENTATION CODES:   Not applicable  INTERVENTION:   Ensure Max protein supplement BID, each supplement provides 150kcal and 30g of protein.  NUTRITION DIAGNOSIS:   Increased nutrient needs related to chronic illness (COPD) as evidenced by estimated needs.  GOAL:   Patient will meet greater than or equal to 90% of their needs  MONITOR:   PO intake,Supplement acceptance,Labs,Weight trends,Skin,I & O's  REASON FOR ASSESSMENT:   Consult Assessment of nutrition requirement/status  ASSESSMENT:   65 y.o. male with medical history significant for coronary artery disease status post PCI to the LAD, chronic diastolic dysfunction CHF, atrial flutter status post DC cardioversion on chronic anticoagulation with Xarelto, PAD status post left subclavian artery stenting, diabetes mellitus with complications of stage III chronic kidney disease, COPD with chronic respiratory failure on 3 L of oxygen, anxiety/depression, GERD who presents to the emergency room from his pulmonologist office for evaluation of a near syncopal episode.  RD working remotely.  Pt is known to this RD from a recent previous admit. Pt with good appetite and oral intake at baseline. Pt ate 100% of his breakfast this morning. RD will add supplements to help pt meet his estimated needs. Per chart, pt appears weight stable at baseline.   Medications reviewed and include: vitamin D, insulin, Mg oxide, oxycodone, protonix, prednisone, senokot, NaCl @100ml /hr  Labs reviewed: BUN 25(H), creat 2.03(H) Wbc- 13.0(H), Hgb 9.8(L), Hct 32.9(L), MCV 77.8(L), MCH 23.2(L), MCHC 29.8(L) cbgs- 369, 184 x 24 hrs AIC 6.5(H)- 10/14  NUTRITION - FOCUSED PHYSICAL EXAM: Unable to perform at this time  Diet Order:   Diet Order            Diet Carb Modified Fluid consistency: Thin; Room service appropriate? Yes  Diet effective now                EDUCATION NEEDS:   Education needs have been  addressed  Skin:  Skin Assessment: Reviewed RN Assessment  Last BM:  2/8  Height:   Ht Readings from Last 1 Encounters:  02/17/20 6\' 2"  (1.88 m)    Weight:   Wt Readings from Last 1 Encounters:  02/18/20 96 kg    Ideal Body Weight:  86.3 kg  BMI:  Body mass index is 27.17 kg/m.  Estimated Nutritional Needs:   Kcal:  2300-2600kcal/day  Protein:  115-130g/day  Fluid:  2.3-2.6L/day  MS, RD, LDN Please refer to [facility] for RD and/or RD on-call/weekend/after hours pager

## 2020-02-18 NOTE — Evaluation (Signed)
Occupational Therapy Evaluation Patient Details Name: Gregory Hall MRN: [id number] DOB: 07-Jan-1956 Today's Date: 02/18/2020    History of Present Illness Pt is a 65 y.o. male who presents to the ED with complaints of feeling feeling dizzy and lightheaded but denied having any falls or loss of consciousness. Pt admitted for AKI. Chart review indicates pt has had multiple past admissions to our facility in past 6 months due to severe COPD exacerbations. Typically remains on 3 L nasal cannula at baseline due to COPD.  A. fib on Xarelto.  HTN, HLD, CAD s/p stenting, preserved ejection fraction, DM on insulin.  He lives in a local group home.   Clinical Impression   [name] was seen for OT evaluation this date. Prior to hospital admission, pt was living in a group home with assistance from staff to complete IADL tasks including cooking & medication management. Pt reports that he is generally independent with ADL tasks at baseline. He typically stand pivots to his WC at baseline for functional mobility. Currently pt demonstrates impairments as described below (See OT problem list) which functionally limit his ability to perform ADL/self-care tasks. Pt currently requires MIN A for LB ADL management, as well as set-up assist for UB tasks while seated or at bed level.  Pt would benefit from skilled OT services to address noted impairments and functional limitations (see below for any additional details) in order to maximize safety and independence while minimizing falls risk and caregiver burden. Upon hospital discharge, recommend HHOT to maximize pt safety and return to functional independence during meaningful occupations of daily life.   Patient suffers from advanced COPD which impairs his ability to perform daily activities like toileting, feeding, dressing, grooming, bathing in the home. A cane, walker, crutch will not resolve the patient's issue with performing activities of daily living. Pt has  an OTC transport chair that does not currently, safely, meet his needs. A lightweight wheelchair and cushion is required/recommended and will allow patient to safely perform daily activities.   Patient can safely propel the wheelchair in the home and has a caregiver who can provide assistance if needed.       Follow Up Recommendations  Home health OT    Equipment Recommendations  Wheelchair (measurements OT)    Recommendations for Other Services       Precautions / Restrictions Precautions Precautions: Fall Restrictions Weight Bearing Restrictions: No      Mobility Bed Mobility               General bed mobility comments: Deferred. Pt declines OOB/EOB 2/2 pain and fatigue from recent PT evaluation. Will continue to monitor.    Transfers                      Balance Overall balance assessment: No apparent balance deficits (not formally assessed)                                         ADL either performed or assessed with clinical judgement   ADL Overall ADL's : Needs assistance/impaired                                       General ADL Comments: Pt functionally limited by generalized weakness, decreased activity tolerance, and pain this date.  He requires SET UP/SUP for UB tasks including bathing, grooming, and self-feeding at bed level or seated. Anticipate MIN A for LB ADL management. Supervision for safety during functional mobility.     Vision Baseline Vision/History: Wears glasses Wears Glasses: At all times Patient Visual Report: No change from baseline       Perception     Praxis      Pertinent Vitals/Pain Pain Assessment: 0-10 Pain Score: 9  Pain Location: Back/shoulders Pain Descriptors / Indicators: Aching;Constant;Sore Pain Intervention(s): Limited activity within patient's tolerance;Monitored during session;Patient requesting pain meds-RN notified     Hand Dominance Right   Extremity/Trunk  Assessment Upper Extremity Assessment Upper Extremity Assessment: Generalized weakness (Pt endorses new tremors in BUE since admission. Generally weak t/o with no focal weakness appreciated.)   Lower Extremity Assessment Lower Extremity Assessment: Generalized weakness       Communication Communication Communication: No difficulties   Cognition Arousal/Alertness: Awake/alert Behavior During Therapy: WFL for tasks assessed/performed;Flat affect Overall Cognitive Status: Within Functional Limits for tasks assessed                                     General Comments       Exercises Other Exercises Other Exercises: Pt educated on importance of functional activity during hospital stay, falls prevention strategies, WC management strategies, and routines modifications to support safety and functional independence upon hospital DC.   Shoulder Instructions      Home Living Family/patient expects to be discharged to:: Group home                             Home Equipment: Wheelchair - manual;Hand held shower head;Grab bars - tub/shower;Grab bars - toilet   Additional Comments: L break broken on WC.      Prior Functioning/Environment Level of Independence: Needs assistance  Gait / Transfers Assistance Needed: Pt generally stand pivot transfers to his WC independently. ADL's / Homemaking Assistance Needed: Pt reports group home staff assist with IADL management including meal prep and medication management. He states staff will assist him with BADL tasks on occasion, but generally he is able to manage bathing, dressing, toileting, etc. independently.            OT Problem List: Decreased strength;Decreased coordination;Decreased activity tolerance;Decreased safety awareness;Cardiopulmonary status limiting activity;Pain;Impaired balance (sitting and/or standing);Decreased knowledge of use of DME or AE      OT Treatment/Interventions: Therapeutic  exercise;Self-care/ADL training;Therapeutic activities;Energy conservation;Visual/perceptual remediation/compensation;DME and/or AE instruction;Patient/family education;Balance training    OT Goals(Current goals can be found in the care plan section) Acute Rehab OT Goals Patient Stated Goal: To go home OT Goal Formulation: With patient Time For Goal Achievement: 03/03/20 Potential to Achieve Goals: Good ADL Goals Pt Will Perform Grooming: sitting;with modified independence (c LRAD PRN for improved safety and functional indep.) Pt Will Perform Lower Body Dressing: sit to/from stand;with modified independence (c LRAD PRN for improved safety and functional indep.) Pt Will Transfer to Toilet: bedside commode;regular height toilet;stand pivot transfer;with modified independence (c LRAD PRN for improved safety and functional indep.) Pt Will Perform Tub/Shower Transfer: Stand pivot transfer;Shower transfer;with supervision;with set-up;shower seat (c LRAD PRN for improved safety and functional indep.)  OT Frequency: Min 1X/week   Barriers to D/C:            Co-evaluation  AM-PAC OT "6 Clicks" Daily Activity     Outcome Measure Help from another person eating meals?: None Help from another person taking care of personal grooming?: A Little Help from another person toileting, which includes using toliet, bedpan, or urinal?: A Little Help from another person bathing (including washing, rinsing, drying)?: A Little Help from another person to put on and taking off regular upper body clothing?: A Little Help from another person to put on and taking off regular lower body clothing?: A Little 6 Click Score: 19   End of Session Nurse Communication: Mobility status;Patient requests pain meds  Activity Tolerance: Patient limited by fatigue;Patient limited by pain Patient left: in bed;with call bell/phone within reach;with bed alarm set  OT Visit Diagnosis: Other abnormalities of  gait and mobility (R26.89);Pain;Muscle weakness (generalized) (M62.81) Pain - Right/Left:  (Both) Pain - part of body: Shoulder (Back and shoulders)                Time: 1540-0867 OT Time Calculation (min): 18 min Charges:  OT General Charges $OT Visit: 1 Visit OT Evaluation $OT Eval Moderate Complexity: 1 Mod OT Treatments $Self Care/Home Management : 8-22 mins  Rockney Ghee, M.S., OTR/L Ascom: [phone] 02/18/20, 12:41 PM

## 2020-02-18 NOTE — Evaluation (Signed)
Physical Therapy Evaluation Patient Details Name: Gregory Hall MRN: [id number] DOB: [birth year] Today's Date: 02/18/2020   History of Present Illness  Per MD notes: Pt is a 65 y.o. male with medical history significant for coronary artery disease status post PCI to the LAD, chronic diastolic dysfunction CHF, atrial flutter status post DC cardioversion on chronic anticoagulation with Xarelto, PAD status post left subclavian artery stenting, diabetes mellitus with complications of stage III chronic kidney disease, COPD with chronic respiratory failure on 3 L of oxygen, anxiety/depression, GERD who presents to the emergency room from his pulmonologist office for evaluation of a near syncopal episode.  MD assessment includes: AKI, near syncope secondary to hypotension, and COPD exacerbation.    Clinical Impression  Pt was pleasant and motivated to participate during the session.  Pt reported being ambulatory and participated in pulmonary rehab as well as with physical therapy "around a year ago" but since then has become non-ambulatory secondary to SOB with exertion.  Pt demonstrated good functional strength but poor activity tolerance and requested continued physical therapy secondary to goals of improving his endurance and quality of life.  Pt is at high risk for continued functional decline and will benefit from HHPT services upon discharge to safely address deficits listed in patient problem list for decreased caregiver assistance and eventual return to PLOF.      Follow Up Recommendations Home health PT;Supervision - Intermittent    Equipment Recommendations  None recommended by PT    Recommendations for Other Services       Precautions / Restrictions Precautions Precautions: Fall Restrictions Weight Bearing Restrictions: No      Mobility  Bed Mobility Overal bed mobility: Independent             General bed mobility comments: Good speed and effort with bed mobility  tasks    Transfers Overall transfer level: Independent Equipment used: None             General transfer comment: Pt steady with good control with SPT's  Ambulation/Gait             General Gait Details: NT, pt reported being non-ambulatory  Careers information officer    Modified Rankin (Stroke Patients Only)       Balance Overall balance assessment: No apparent balance deficits (not formally assessed)                                           Pertinent Vitals/Pain Pain Assessment: 0-10 Pain Score: 9  Pain Location: Back/shoulders Pain Descriptors / Indicators: Aching;Sore Pain Intervention(s): Premedicated before session;Monitored during session    Home Living Family/patient expects to be discharged to:: Group home               Home Equipment: Wheelchair - manual;Hand held shower head;Grab bars - tub/shower;Grab bars - toilet Additional Comments: L break broken on WC.    Prior Function Level of Independence: Needs assistance   Gait / Transfers Assistance Needed: Pt generally stand pivot transfers to his W/C independently, no fall history, has not ambulated in "around a year"  ADL's / Homemaking Assistance Needed: Pt reports group home staff assist with IADL management including meal prep and medication management. He states staff will assist him with ADL tasks on occasion, but generally he is able to manage bathing, dressing,  toileting, etc. independently.        Hand Dominance   Dominant Hand: Right    Extremity/Trunk Assessment   Upper Extremity Assessment Upper Extremity Assessment: Generalized weakness    Lower Extremity Assessment Lower Extremity Assessment: Generalized weakness       Communication   Communication: No difficulties  Cognition Arousal/Alertness: Awake/alert Behavior During Therapy: WFL for tasks assessed/performed Overall Cognitive Status: Within Functional Limits for tasks  assessed                                        General Comments      Exercises Other Exercises Other Exercises: Extensive pt education on dyspnea cycle, physiological benefits of activity, energy conservation, and principles of activity progression using RPE to guide intensity   Assessment/Plan    PT Assessment Patient needs continued PT services  PT Problem List Decreased strength;Decreased activity tolerance;Decreased knowledge of use of DME       PT Treatment Interventions DME instruction;Gait training;Functional mobility training;Therapeutic activities;Therapeutic exercise;Balance training;Patient/family education    PT Goals (Current goals can be found in the Care Plan section)  Acute Rehab PT Goals Patient Stated Goal: Improved endurance PT Goal Formulation: With patient Time For Goal Achievement: 03/02/20 Potential to Achieve Goals: Fair    Frequency Min 2X/week   Barriers to discharge        Co-evaluation               AM-PAC PT "6 Clicks" Mobility  Outcome Measure Help needed turning from your back to your side while in a flat bed without using bedrails?: None Help needed moving from lying on your back to sitting on the side of a flat bed without using bedrails?: None Help needed moving to and from a bed to a chair (including a wheelchair)?: None Help needed standing up from a chair using your arms (e.g., wheelchair or bedside chair)?: None Help needed to walk in hospital room?: Total Help needed climbing 3-5 steps with a railing? : Total 6 Click Score: 18    End of Session Equipment Utilized During Treatment: Gait belt Activity Tolerance: Patient tolerated treatment well Patient left: in bed;with call bell/phone within reach;with bed alarm set Nurse Communication: Mobility status;Other (comment) (Pt requested nausea meds) PT Visit Diagnosis: Difficulty in walking, not elsewhere classified (R26.2);Muscle weakness (generalized)  (M62.81)    Time: 2440-1027 PT Time Calculation (min) (ACUTE ONLY): 26 min   Charges:   PT Evaluation $PT Eval Low Complexity: 1 Low PT Treatments $Therapeutic Activity: 8-22 mins        D. Scott Nayra Coury PT, DPT 02/18/20, 1:23 PM

## 2020-02-18 NOTE — TOC Transition Note (Signed)
Transition of Care [facility]) - CM/SW Discharge Note   Patient Details  Name: Gregory Hall MRN: [id number] Date of Birth: November 19, 1955  [facility] CM/SW Contact:  Maree Krabbe, LCSW Phone Number: 02/18/2020, 3:19 PM   Clinical Narrative:   Pt states he lives at [facility] (Group Home). Pt states he needs a new transport chair, his break is broken on his and its 67 years old. [employer] has ordered him a new chair--pt appreciative. Pt states he would also like HH if he can get it. CSW will reach out to [facility] agencies.      Barriers to Discharge: Continued Medical Work up   Patient Goals and CMS Choice Patient states their goals for this hospitalization and ongoing recovery are:: to get better   Choice offered to / list presented to : Patient  Discharge Placement                       Discharge Plan and Services In-house Referral: NA   Post Acute Care Choice: Home Health          DME Arranged: Other see comment Theme park manager) DME Agency: [employer] Date DME Agency Contacted: 02/18/20 Time DME Agency Contacted: [phone] Representative spoke with at DME Agency: [place] [employer] Arranged: PT          Social Determinants of Health (SDOH) Interventions     Readmission Risk Interventions Readmission Risk Prevention Plan 02/18/2020 12/15/2019 09/24/2019  Transportation Screening Complete Complete Complete  PCP or Specialist Appt within 3-5 Days - - -  HRI or Home Care Consult - - -  Social Work Consult for Recovery Care Planning/Counseling - - -  Palliative Care Screening - - -  Medication Review Oceanographer) (No Data) Complete Complete  PCP or Specialist appointment within 3-5 days of discharge Complete - Complete  HRI or Home Care Consult Complete Complete Complete  SW Recovery Care/Counseling Consult Complete - -  Palliative Care Screening Not Applicable Not Applicable Complete  Skilled Nursing Facility Not Applicable Not Applicable Complete   Some recent data might be hidden

## 2020-02-18 NOTE — Consult Note (Signed)
Pulmonary Medicine          Date: 02/18/2020,   MRN# [id number] [facility] 04/20/1955     AdmissionWeight: 91.6 kg                 CurrentWeight: 96 kg  Referring physician: Dr Nelson Chimes    CHIEF COMPLAINT:   Acute exacerbation of COPD   HISTORY OF PRESENT ILLNESS   This is 65 year old pleasant male with a history of CAD status post stenting, chronic pain syndrome, cervicalgia, GERD, essential hypertension, neuropathy, dysphagia history of cholecystectomy who came in for worsening respiratory symptoms including wheezing refractory to inhaler therapy at home.  Additionally patient reports chest discomfort and cough however denies fever chills diaphoresis nausea or vomiting.  He was found to be hypoxemic in the 80s which improved with supplemental oxygen.  I saw patient on day of admission in clinic he was hypotensive and hypoxemic which promted ER evaluation.  He is admitted for acute exacerbation of COPD.   PAST MEDICAL HISTORY   Past Medical History:  Diagnosis Date  . AKI (acute kidney injury) (HCC) 09/12/2019  . CAD (coronary artery disease)    s/p PTCA and stent x2  . Chest pain   . CHF (congestive heart failure) (HCC)   . Chronic pain syndrome   . COPD (chronic obstructive pulmonary disease) (HCC)   . Degenerative cervical disc   . Depression   . Diabetes mellitus without complication (HCC)   . Dyslipidemia   . GERD (gastroesophageal reflux disease)   . Hernia 2014  . Hypertension   . MRSA (methicillin resistant staph aureus) culture positive 2011  . Neuropathy   . Nutcracker esophagus   . Rectus diastasis 07/19/2012     SURGICAL HISTORY   Past Surgical History:  Procedure Laterality Date  . BACK SURGERY  2012  . CARDIOVERSION N/A 09/26/2019   Procedure: CARDIOVERSION;  Surgeon: Antonieta Iba, MD;  Location: [facility] ORS;  Service: Cardiovascular;  Laterality: N/A;  . CHOLECYSTECTOMY    . COLONOSCOPY  Jan 2014   [name]  . CORONARY  ANGIOPLASTY WITH STENT PLACEMENT  2009   stents x2, in [place], [place]  . FOOT SURGERY     Right  . NECK SURGERY    . SPINE SURGERY  2012,2013  . TONSILLECTOMY       FAMILY HISTORY   Family History  Family history unknown: Yes     SOCIAL HISTORY   Social History   Tobacco Use  . Smoking status: Former Smoker    Packs/day: 0.50    Years: 30.00    Pack years: 15.00    Types: Cigarettes    Quit date: 11/26/2019    Years since quitting: 0.2  . Smokeless tobacco: Never Used  Substance Use Topics  . Alcohol use: No  . Drug use: No     MEDICATIONS    Home Medication:    Current Medication:  Current Facility-Administered Medications:  .  amiodarone (PACERONE) tablet 200 mg, 200 mg, Oral, Daily, Agbata, Tochukwu, MD, 200 mg at 02/18/20 0850 .  atorvastatin (LIPITOR) tablet 40 mg, 40 mg, Oral, QHS, Agbata, Tochukwu, MD, 40 mg at 02/17/20 2012 .  budesonide (PULMICORT) nebulizer solution 0.5 mg, 0.5 mg, Nebulization, BID, Agbata, Tochukwu, MD .  cholecalciferol (VITAMIN D) tablet 2,000 Units, 2,000 Units, Oral, Daily, Agbata, Tochukwu, MD, 2,000 Units at 02/18/20 0851 .  diphenhydrAMINE (BENADRYL) injection 25 mg, 25 mg, Intravenous, TID, Vida Rigger, MD .  DULoxetine (CYMBALTA)  DR capsule 60 mg, 60 mg, Oral, Daily, Agbata, Tochukwu, MD, 60 mg at 02/18/20 0852 .  fluticasone (FLONASE) 50 MCG/ACT nasal spray 1 spray, 1 spray, Each Nare, BID, Agbata, Tochukwu, MD, 1 spray at 02/18/20 0852 .  furosemide (LASIX) injection 40 mg, 40 mg, Intravenous, Daily, Yaasir Menken, MD .  HYDROmorphone (DILAUDID) injection 1 mg, 1 mg, Intravenous, Q3H PRN, Karna Christmas, Kanyia Heaslip, MD .  insulin aspart (novoLOG) injection 0-15 Units, 0-15 Units, Subcutaneous, TID WC, Agbata, Tochukwu, MD, 3 Units at 02/18/20 1225 .  ipratropium-albuterol (DUONEB) 0.5-2.5 (3) MG/3ML nebulizer solution 3 mL, 3 mL, Nebulization, Q6H PRN, Agbata, Tochukwu, MD, 3 mL at 02/18/20 1324 .  lamoTRIgine (LAMICTAL) tablet  100 mg, 100 mg, Oral, Daily, Agbata, Tochukwu, MD, 100 mg at 02/18/20 0852 .  levofloxacin (LEVAQUIN) tablet 750 mg, 750 mg, Oral, Q48H, Amin, Tilman Neat, MD, 750 mg at 02/18/20 1223 .  loratadine (CLARITIN) tablet 10 mg, 10 mg, Oral, Daily, Agbata, Tochukwu, MD, 10 mg at 02/18/20 0856 .  magnesium oxide (MAG-OX) tablet 400 mg, 400 mg, Oral, Daily, Agbata, Tochukwu, MD, 400 mg at 02/18/20 0850 .  midodrine (PROAMATINE) tablet 10 mg, 10 mg, Oral, TID, Karna Christmas, Edwardo Wojnarowski, MD .  nitroGLYCERIN (NITROSTAT) SL tablet 0.4 mg, 0.4 mg, Sublingual, Q5 min PRN, Agbata, Tochukwu, MD .  oxyCODONE (Oxy IR/ROXICODONE) immediate release tablet 10 mg, 10 mg, Oral, Q6H, Agbata, Tochukwu, MD, 10 mg at 02/18/20 1354 .  oxymetazoline (AFRIN) 0.05 % nasal spray 2 spray, 2 spray, Each Nare, BID PRN, Agbata, Tochukwu, MD .  pantoprazole (PROTONIX) EC tablet 40 mg, 40 mg, Oral, Daily, Agbata, Tochukwu, MD, 40 mg at 02/18/20 0856 .  [COMPLETED] methylPREDNISolone sodium succinate (SOLU-MEDROL) 40 mg/mL injection 40 mg, 40 mg, Intravenous, Q12H, 40 mg at 02/18/20 0856 **FOLLOWED BY** [START ON 02/19/2020] predniSONE (DELTASONE) tablet 40 mg, 40 mg, Oral, Q breakfast, Agbata, Tochukwu, MD .  pregabalin (LYRICA) capsule 200 mg, 200 mg, Oral, Q8H, Agbata, Tochukwu, MD, 200 mg at 02/18/20 1353 .  protein supplement (ENSURE MAX) liquid, 11 oz, Oral, BID BM, Agbata, Tochukwu, MD .  rivaroxaban (XARELTO) tablet 20 mg, 20 mg, Oral, Q supper, Agbata, Tochukwu, MD, 20 mg at 02/17/20 1831 .  roflumilast (DALIRESP) tablet 250 mcg, 250 mcg, Oral, Daily, Agbata, Tochukwu, MD, 250 mcg at 02/18/20 0851 .  senna-docusate (Senokot-S) tablet 1 tablet, 1 tablet, Oral, Daily, Agbata, Tochukwu, MD, 1 tablet at 02/18/20 0850 .  sodium chloride (OCEAN) 0.65 % nasal spray 1 spray, 1 spray, Each Nare, PRN, Agbata, Tochukwu, MD .  tamsulosin (FLOMAX) capsule 0.4 mg, 0.4 mg, Oral, QPC supper, Agbata, Tochukwu, MD, 0.4 mg at 02/17/20 1830 .  traZODone (DESYREL)  tablet 150 mg, 150 mg, Oral, QHS, Agbata, Tochukwu, MD .  Ubrogepant TABS 100 mg, 100 mg, Oral, Daily, Agbata, Tochukwu, MD    ALLERGIES   Acetaminophen, Fentanyl, and Nsaids     REVIEW OF SYSTEMS    Review of Systems:  Gen:  Denies  fever, sweats, chills weigh loss  HEENT: Denies blurred vision, double vision, ear pain, eye pain, hearing loss, nose bleeds, sore throat Cardiac:  No dizziness, chest pain or heaviness, chest tightness,edema Resp: Reports shortness of breath,wheezing, denies hemoptysis,  Gi: Denies swallowing difficulty, stomach pain, nausea or vomiting, diarrhea, constipation, bowel incontinence Gu:  Denies bladder incontinence, burning urine Ext:   Denies Joint pain, stiffness or swelling Skin: Denies  skin rash, easy bruising or bleeding or hives Endoc:  Denies polyuria, polydipsia , polyphagia or weight change Psych:  Denies depression, insomnia or hallucinations   Other:  All other systems negative   VS: BP (!) 124/50 (BP Location: Right Arm)   Pulse 72   Temp 98.2 F (36.8 C)   Resp 18   Ht  (1.88 m)   Wt 96 kg   SpO2 92%   BMI 27.17 kg/m      PHYSICAL EXAM    GENERAL:NAD, no fevers, chills, no weakness no fatigue HEAD: Normocephalic, atraumatic.  EYES: Pupils equal, round, reactive to light. Extraocular muscles intact. No scleral icterus.  MOUTH: Moist mucosal membrane. Dentition intact. No abscess noted.  EAR, NOSE, THROAT: Clear without exudates. No external lesions.  NECK: Supple. No thyromegaly. No nodules. No JVD.  PULMONARY:bilateral exp and insp multiphonic wheezing and rhonchi bilaterally CARDIOVASCULAR: S1 and S2. Regular rate and rhythm. No murmurs, rubs, or gallops. No edema. Pedal pulses 2+ bilaterally.  GASTROINTESTINAL: Soft, nontender, nondistended. No masses. Positive bowel sounds. No hepatosplenomegaly.  MUSCULOSKELETAL: No swelling, clubbing, or edema. Range of motion full in all extremities.  NEUROLOGIC: Cranial  nerves II through XII are intact. No gross focal neurological deficits. Sensation intact. Reflexes intact.  SKIN: No ulceration, lesions, rashes, or cyanosis. Skin warm and dry. Turgor intact.  PSYCHIATRIC: Mood, affect within normal limits. The patient is awake, alert and oriented x 3. Insight, judgment intact.       IMAGING    MR BRAIN WO CONTRAST  Result Date: 01/22/2020 CLINICAL DATA:  Hand tremor 1 month.  Confusion. EXAM: MRI HEAD WITHOUT CONTRAST TECHNIQUE: Multiplanar, multiecho pulse sequences of the brain and surrounding structures were obtained without intravenous contrast. COMPARISON:  CT head 10/22/2019, 04/07/2019, 03/04/2015 as well as earlier CT studies. FINDINGS: Brain: Ventricle size normal. Cerebral volume normal. Negative for acute infarct. Normal white matter. No significant chronic ischemia. No hemorrhage. Cystic lesion is present in the right anterior temporal lobe measuring approximately 8 x 11 mm. This is well-defined with surrounding mild hyperintensity. This appears unchanged from CT in 2017 and also present on the more recent CT studies. The lesion is not seen on a CT from 03/30/2013. Vascular: Normal arterial flow voids Skull and upper cervical spine: No focal skeletal lesion. Sinuses/Orbits: Mild to moderate mucosal edema paranasal sinuses. Bilateral cataract extraction Other: None IMPRESSION: Negative for acute or chronic infarction. 8 x 11 mm cyst in the right anterior temporal lobe with surrounding edema. This is present on prior CT studies in 2017 but not 2015. This could represent a neoplasm. Follow-up MRI brain with contrast recommended for baseline evaluation. Electronically Signed   By: Marlan Palau M.D.   On: 01/22/2020 11:29   DG Chest Port 1 View  Result Date: 02/18/2020 CLINICAL DATA:  Cough and shortness of breath EXAM: PORTABLE CHEST 1 VIEW COMPARISON:  February 17, 2020 FINDINGS: There are areas of atelectatic change in each lung base. There is no evident  edema or airspace opacity. Heart size and pulmonary vascularity are normal. No adenopathy. There is aortic atherosclerosis. Thoracic stimulator leads are present in the midthoracic region, stable. There is postoperative change in the lower cervical region. There is a stent in the left innominate vein region, stable. IMPRESSION: Bibasilar atelectasis. No edema or airspace opacity. Stable cardiac silhouette. Aortic Atherosclerosis (ICD10-I70.0). Electronically Signed   By: Bretta Bang III M.D.   On: 02/18/2020 15:22   DG Chest Portable 1 View  Result Date: 02/17/2020 CLINICAL DATA:  Shortness of breath EXAM: PORTABLE CHEST 1 VIEW COMPARISON:  12/13/2018 FINDINGS: Cardiac shadow is within  normal limits. Aortic calcifications are seen. Left subclavian arterial stenting is noted. Spinal stimulator is seen. The lungs are well aerated bilaterally. Mild patchy bibasilar atelectatic changes are seen. No bony abnormality is seen. IMPRESSION: Mild basilar atelectasis Electronically Signed   By: Alcide Clever M.D.   On: 02/17/2020 11:57   CT RENAL STONE STUDY  Result Date: 02/17/2020 CLINICAL DATA:  Acute renal failure. EXAM: CT ABDOMEN AND PELVIS WITHOUT CONTRAST TECHNIQUE: Multidetector CT imaging of the abdomen and pelvis was performed following the standard protocol without IV contrast. COMPARISON:  CT abdomen pelvis June 24, 2012 and lumbar spine CT May 25, 2019. FINDINGS: Lower chest: Bibasilar consolidative opacities. Normal size heart. No pericardial effusion. Coronary artery calcifications. Hepatobiliary: No suspicious hepatic lesion. Cholecystectomy. No biliary ductal dilatation. Pancreas: Unremarkable Spleen: Unremarkable Adrenals/Urinary Tract: Bilateral adrenal glands are unremarkable. No hydronephrosis. No nephrolithiasis. Bladder is unremarkable. Stomach/Bowel: Stomach is distended without evidence of wall thickening. No suspicious small bowel wall thickening or dilation. Normal appendix. Redundant  sigmoid colon. Moderate volume of formed stool throughout the colon. Colonic diverticulosis without findings of acute diverticulosis. Vascular/Lymphatic: Aortic atherosclerosis. No enlarged abdominal or pelvic lymph nodes. Reproductive: Prostate is unremarkable. Other: No abdominopelvic ascites. Musculoskeletal: L4-L5 posterior lumbar fusion and disc spacer. No significant change in the L1 superior endplate compression deformity. IMPRESSION: 1. No hydronephrosis. 2. Bibasilar consolidative opacities, which could reflect atelectasis or pneumonia. 3. Colonic diverticulosis without findings of acute diverticulosis. 4. Moderate volume of formed stool throughout the colon. 5. Aortic atherosclerosis. Aortic Atherosclerosis (ICD10-I70.0). Electronically Signed   By: Maudry Mayhew MD   On: 02/17/2020 17:24   VAS US CAROTID  Result Date: 02/03/2020 Carotid Arterial Duplex Study Indications:       Bilateral bruits and Lt Subc stent. Risk Factors:      Hypertension, hyperlipidemia, Diabetes, current smoker,                    coronary artery disease, PAD. Other Factors:     CHF. Comparison Study:  08/28/2014 showed normal ICA velocities, bilaterally. Performing Technologist: Joaquim Nam  Examination Guidelines: A complete evaluation includes B-mode imaging, spectral Doppler, color Doppler, and power Doppler as needed of all accessible portions of each vessel. Bilateral testing is considered an integral part of a complete examination. Limited examinations for reoccurring indications may be performed as noted.  Right Carotid Findings: +----------+--------+--------+--------+-------------------------------+--------+           PSV cm/sEDV cm/sStenosisPlaque Description             Comments +----------+--------+--------+--------+-------------------------------+--------+ CCA Prox  111     24                                                       +----------+--------+--------+--------+-------------------------------+--------+ CCA Mid   107     31                                                      +----------+--------+--------+--------+-------------------------------+--------+ CCA Distal100     25                                                      +----------+--------+--------+--------+-------------------------------+--------+  ICA Prox  125     37              diffuse, irregular and calcific         +----------+--------+--------+--------+-------------------------------+--------+ ICA Mid   170     55      40-59%  calcific and diffuse                    +----------+--------+--------+--------+-------------------------------+--------+ ICA Distal159     52                                                      +----------+--------+--------+--------+-------------------------------+--------+ ECA       159     20              focal and calcific                      +----------+--------+--------+--------+-------------------------------+--------+ +----------+--------+-------+----------------+-------------------+           PSV cm/sEDV cmsDescribe        Arm Pressure (mmHG) +----------+--------+-------+----------------+-------------------+ [id number]             Multiphasic, [id number]                  +----------+--------+-------+----------------+-------------------+ +---------+--------+--+--------+--+---------+ VertebralPSV cm/s68EDV cm/s27Antegrade +---------+--------+--+--------+--+---------+  Left Carotid Findings: +----------+--------+--------+--------+-------------------------------+--------+           PSV cm/sEDV cm/sStenosisPlaque Description             Comments +----------+--------+--------+--------+-------------------------------+--------+ CCA Prox  106     18      <50%    diffuse, irregular and calcific          +----------+--------+--------+--------+-------------------------------+--------+ CCA Mid   105     26                                                      +----------+--------+--------+--------+-------------------------------+--------+ CCA Distal108     33                                                      +----------+--------+--------+--------+-------------------------------+--------+ ICA Prox  166     58      40-59%  focal, smooth and calcific              +----------+--------+--------+--------+-------------------------------+--------+ ICA Mid   150     40                                                      +----------+--------+--------+--------+-------------------------------+--------+ ICA Distal139     44                                                      +----------+--------+--------+--------+-------------------------------+--------+ ECA  148     17                                                      +----------+--------+--------+--------+-------------------------------+--------+ +----------+--------+--------+----------------+-------------------+           PSV cm/sEDV cm/sDescribe        Arm Pressure (mmHG) +----------+--------+--------+----------------+-------------------+ Subclavian102             Multiphasic, RUE45                  +----------+--------+--------+----------------+-------------------+ +---------+--------+--+--------+-+---------+ VertebralPSV cm/s30EDV cm/s9Antegrade +---------+--------+--+--------+-+---------+  Summary: Right Carotid: Velocities in 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 Left Carotid: Velocities in the left ICA are consistent with a 40-59% stenosis. Vertebrals:  Bilateral vertebral arteries demonstrate antegrade flow. Subclavians: Normal flow hemodynamics were seen in bilateral subclavian              arteries. *See table(s) above for measurements and observations. Suggest follow  up study in 12 months. Electronically signed by Charlton Haws MD on 02/03/2020 at 12:59:09 PM.    Final (Updated)       ASSESSMENT/PLAN   Acute on chronic hypoxemic respiratory failure -Due to moderate acute exacerbation of COPD -Complicated by bibasilar atelectasis -Agree with Levoquin for AECOPD - Solu-Medrol from 40 2 times daily -Agree with Pulmicort and Brovana -PT OT when able -I-S at bedside encouraged to use multiple times each hour -Acapella device use as able several times daily -Roflumilast 250 MCG p.o. daily -respiratory cultures -currently on 4L/min Mayodan -midodrine 10 tid -lasix 40 daily IV -increased dilaudid to  q3h with diphenhydramine - patient has advanced copd with chronic pain syndrome and elevated tolerance.    Thank you for allowing me to participate in the care of this patient.   Patient/Family are satisfied with care plan and all questions have been answered.   This document was prepared using Dragon voice recognition software and may include unintentional dictation errors.     Vida Rigger, M.D.  Division of Pulmonary & Critical Care Medicine  [facility] [place]

## 2020-02-18 NOTE — Progress Notes (Signed)
Inpatient Diabetes Program Recommendations  AACE/ADA: New Consensus Statement on Inpatient Glycemic Control   Target Ranges:  Prepandial:   less than 140 mg/dL      Peak postprandial:   less than 180 mg/dL (1-2 hours)      Critically ill patients:  140 - 180 mg/dL   Results for OLLIE, ESTY (MRN [id number]) as of 02/18/2020 10:35  Ref. Range 02/17/2020 18:29 02/18/2020 08:45  Glucose-Capillary Latest Ref Range: 70 - 99 mg/dL 427 (H) 062 (H)   Review of Glycemic Control  Diabetes history: DM2 Outpatient Diabetes medications: Glyburide 2.5 QAM, Humalog 4-6 units, Lantus 11 units QHS Current orders for Inpatient glycemic control: Novolog 0-15 units TID with meals; Prednisone 40 mg QAM  Inpatient Diabetes Program Recommendations:    Insulin: Please consider adding Novolog 0-5 units QHS for bedtime correction.  If steroids are continued, please consider ordering Novolog 3 units TID with meals for meal coverage if patient eats at least 50% of meals.  Thanks, Orlando Penner, RN, MSN, CDE Diabetes Coordinator Inpatient Diabetes Program [phone] (Team Pager from 8am to 5pm)

## 2020-02-19 ENCOUNTER — Inpatient Hospital Stay: Payer: Medicare Other

## 2020-02-19 DIAGNOSIS — N179 Acute kidney failure, unspecified: Secondary | ICD-10-CM | POA: Diagnosis not present

## 2020-02-19 LAB — BASIC METABOLIC PANEL
Anion gap: 11 (ref 5–15)
BUN: 30 mg/dL — ABNORMAL HIGH (ref 8–23)
CO2: 35 mmol/L — ABNORMAL HIGH (ref 22–32)
Calcium: 9 mg/dL (ref 8.9–10.3)
Chloride: 90 mmol/L — ABNORMAL LOW (ref 98–111)
Creatinine, Ser: 1.31 mg/dL — ABNORMAL HIGH (ref 0.61–1.24)
GFR, Estimated: 60 mL/min — ABNORMAL LOW (ref >60)
Glucose, Bld: 267 mg/dL — ABNORMAL HIGH (ref 70–99)
Potassium: 5.1 mmol/L (ref 3.5–5.1)
Sodium: 136 mmol/L (ref 135–145)

## 2020-02-19 LAB — GLUCOSE, CAPILLARY
Glucose-Capillary: 246 mg/dL — ABNORMAL HIGH (ref 70–99)
Glucose-Capillary: 168 mg/dL — ABNORMAL HIGH (ref 70–99)
Glucose-Capillary: 227 mg/dL — ABNORMAL HIGH (ref 70–99)
Glucose-Capillary: 185 mg/dL — ABNORMAL HIGH (ref 70–99)

## 2020-02-19 LAB — MAGNESIUM: Magnesium: 2.2 mg/dL (ref 1.7–2.4)

## 2020-02-19 MED ORDER — METHYLPREDNISOLONE SODIUM SUCC 40 MG IJ SOLR
40.0000 mg | INTRAMUSCULAR | Status: DC
  Administered 2020-02-20 – 2020-02-21 (×2): 40 mg via INTRAVENOUS
  Filled 2020-02-19 (×2): qty 1

## 2020-02-19 MED ORDER — GLYCOPYRROLATE 1 MG PO TABS
1.0000 mg | ORAL_TABLET | Freq: Three times a day (TID) | ORAL | Status: DC
  Administered 2020-02-19 – 2020-02-21 (×6): 1 mg via ORAL
  Filled 2020-02-19 (×8): qty 1

## 2020-02-19 MED ORDER — DIPHENHYDRAMINE HCL 25 MG PO CAPS
25.0000 mg | ORAL_CAPSULE | Freq: Three times a day (TID) | ORAL | Status: DC
  Administered 2020-02-19 – 2020-02-21 (×7): 25 mg via ORAL
  Filled 2020-02-19 (×7): qty 1

## 2020-02-19 MED ORDER — GUAIFENESIN ER 600 MG PO TB12
600.0000 mg | ORAL_TABLET | Freq: Two times a day (BID) | ORAL | Status: DC
  Administered 2020-02-19 – 2020-02-21 (×5): 600 mg via ORAL
  Filled 2020-02-19 (×5): qty 1

## 2020-02-19 MED ORDER — LEVOFLOXACIN 500 MG PO TABS
500.0000 mg | ORAL_TABLET | Freq: Every day | ORAL | Status: DC
  Administered 2020-02-19 – 2020-02-21 (×3): 500 mg via ORAL
  Filled 2020-02-19 (×3): qty 1

## 2020-02-19 NOTE — Progress Notes (Signed)
Inpatient Diabetes Program Recommendations  AACE/ADA: New Consensus Statement on Inpatient Glycemic Control  Target Ranges:  Prepandial:   less than 140 mg/dL      Peak postprandial:   less than 180 mg/dL (1-2 hours)      Critically ill patients:  140 - 180 mg/dL   Results for Gregory Hall, Gregory Hall (MRN [id number]) as of 02/19/2020 10:59  Ref. Range 02/18/2020 08:45 02/18/2020 11:55 02/18/2020 16:39 02/18/2020 20:18 02/19/2020 07:34  Glucose-Capillary Latest Ref Range: 70 - 99 mg/dL 706 (H) 237 (H) 628 (H) 175 (H) 246 (H)   Review of Glycemic Control  Diabetes history: DM2 Outpatient Diabetes medications: Glyburide 2.5 QAM, Humalog 4-6 units, Lantus 11 units QHS Current orders for Inpatient glycemic control: Novolog 0-15 units TID with meals; Solumedrol 40 mg Q12H  Inpatient Diabetes Program Recommendations:    Insulin:   If steroids are continued, please consider ordering Novolog 3 units TID with meals for meal coverage if patient eats at least 50% of meals.  Thanks, Orlando Penner, RN, MSN, CDE Diabetes Coordinator Inpatient Diabetes Program [phone] (Team Pager from 8am to 5pm)

## 2020-02-19 NOTE — Progress Notes (Signed)
PROGRESS NOTE    Gregory Hall  ION:[id number] DOB: 05-Jun-1955 DOA: 02/17/2020 PCP: Koren Bound, NP   Brief Narrative: Taken from H&P Gregory Hall is a 64 y.o. male with medical history significant for coronary artery disease status post PCI to the LAD, chronic diastolic dysfunction CHF, atrial flutter status post DC cardioversion on chronic anticoagulation with Xarelto, PAD status post left subclavian artery stenting, diabetes mellitus with complications of stage III chronic kidney disease, COPD with chronic respiratory failure on 3 L of oxygen, anxiety/depression, GERD who presents to the emergency room from his pulmonologist office for evaluation of a near syncopal episode.  The staff at [facility] clinic reported a blood pressure of 60/40.  Patient complained of feeling dizzy and lightheaded but denied having any falls or loss of consciousness. Labs within normal limit except AKI and x-ray with hyperinflated lungs and no new infiltrate.  Subjective: Patient was complaining of tight chest, stating that he cannot bring that phlegm out. Denies any chest pain.  Assessment & Plan:   Principal Problem:   AKI (acute kidney injury) (HCC) Active Problems:   COPD with acute exacerbation (HCC)   CAD (coronary artery disease)   Atrial flutter (HCC)   Chronic respiratory failure with hypoxia (HCC)   Diabetes mellitus without complication (HCC)   Diastolic dysfunction with chronic heart failure (HCC)   Near syncope  AKI. Creatinine continue to improve.  Baseline less than 1.  Creatinine at 1.31 today.  Renal CT without hydronephrosis or nephrolithiasis. -Monitor renal function -Encourage p.o. hydration -Avoid nephrotoxins  Near syncope.  Most likely secondary to hypotension.  Blood pressure responded to fluid resuscitation. -Continue with midodrine -Keep holding torsemide-can be restarted on discharge.  Chronic respiratory failure/COPD exacerbation.  Saturating in low 90s  on his home oxygen requirement of 3 L.  Was having some expiratory wheeze. He was placed on cefepime for concern of pneumonia. Pulmonary was consulted-appreciate their recommendations. -Switch cefepime with p.o. Levaquin, pulmonology wants to complete a 5-day course because of his risk factors, chest x-ray without any sign of pneumonia and procalcitonin negative. -Robinul was added by pulmonary for his complaint of about phlegm causing irritation and tight chest. -Continue with steroid -Continue with bronchodilators -Continue with supportive care -Continue with supplemental oxygen to keep the saturation above 90%  Chronic pain syndrome.  Patient with uncontrolled pain on his home regimen of oxycodone. -Continue Dilaudid with home regimen and monitor  History of atrial flutter. -Continue with amiodarone and Xarelto  BPH. -Continue with Flomax  History of depression and anxiety. -Continue with home dose of trazodone and duloxetine  Diet controlled diabetes mellitus. -Continue to monitor -As needed SSI  Objective: Vitals:   02/19/20 0924 02/19/20 0925 02/19/20 1133 02/19/20 1245  BP:  (!) 131/54 (!) 142/55 (!) 145/71  Pulse:  74  65  Resp:  20  17  Temp:  98.2 F (36.8 C) 98.2 F (36.8 C) 98.1 F (36.7 C)  TempSrc:  Oral Oral Oral  SpO2: (!) 80% 93%  92%  Weight:      Height:        Intake/Output Summary (Last 24 hours) at 02/19/2020 1341 Last data filed at 02/19/2020 1159 Gross per 24 hour  Intake --  Output 2950 ml  Net -2950 ml   Filed Weights   02/17/20 1122 02/18/20 0341 02/19/20 0607  Weight: 91.6 kg 96 kg 96 kg    Examination:  General. Well-developed gentleman, in no acute distress. Pulmonary. Prolonged expiratory phase with some  wheeze, normal respiratory effort. CV.  Regular rate and rhythm, no JVD, rub or murmur. Abdomen.  Soft, nontender, nondistended, BS positive. CNS.  Alert and oriented x3.  No focal neurologic deficit. Extremities.  No edema, no  cyanosis, pulses intact and symmetrical. Psychiatry.  Judgment and insight appears normal.    DVT prophylaxis: Xarelto Code Status: Full Family Communication: Discussed with patient Disposition Plan:  Status is: Inpatient  Remains inpatient appropriate because:Inpatient level of care appropriate due to severity of illness   Dispo: The patient is from: Group home              Anticipated d/c is to: Group home              Anticipated d/c date is: 1 day              Patient currently is not medically stable to d/c.   Difficult to place patient No               Level of care: Med-Surg  Consultants:   Pulmonology  Procedures:  Antimicrobials:  Levaquin  Data Reviewed: I have personally reviewed following labs and imaging studies  CBC: Recent Labs  Lab 02/17/20 1149 02/18/20 0550  WBC 9.8 13.0*  NEUTROABS 7.7  --   HGB 10.5* 9.8*  HCT 35.6* 32.9*  MCV 78.6* 77.8*  PLT 354 333   Basic Metabolic Panel: Recent Labs  Lab 02/17/20 1149 02/18/20 0550 02/19/20 0644  NA 137 137 136  K 3.3* 4.5 5.1  CL 89* 91* 90*  CO2 35* 32 35*  GLUCOSE 196* 162* 267*  BUN 24* 25* 30*  CREATININE 3.20* 2.03* 1.31*  CALCIUM 8.7* 8.5* 9.0  MG  --   --  2.2   GFR: Estimated Creatinine Clearance: 63.6 mL/min (A) (by C-G formula based on SCr of 1.31 mg/dL (H)). Liver Function Tests: Recent Labs  Lab 02/17/20 1149  AST 13*  ALT 8  ALKPHOS 86  BILITOT 0.5  PROT 6.8  ALBUMIN 3.1*   No results for input(s): LIPASE, AMYLASE in the last 168 hours. No results for input(s): AMMONIA in the last 168 hours. Coagulation Profile: No results for input(s): INR, PROTIME in the last 168 hours. Cardiac Enzymes: No results for input(s): CKTOTAL, CKMB, CKMBINDEX, TROPONINI in the last 168 hours. BNP (last 3 results) No results for input(s): PROBNP in the last 8760 hours. HbA1C: Recent Labs    02/18/20 0550  HGBA1C 7.9*   CBG: Recent Labs  Lab 02/18/20 1155 02/18/20 1639  02/18/20 2018 02/19/20 0734 02/19/20 1152  GLUCAP 200* 256* 175* 246* 168*   Lipid Profile: No results for input(s): CHOL, HDL, LDLCALC, TRIG, CHOLHDL, LDLDIRECT in the last 72 hours. Thyroid Function Tests: No results for input(s): TSH, T4TOTAL, FREET4, T3FREE, THYROIDAB in the last 72 hours. Anemia Panel: No results for input(s): VITAMINB12, FOLATE, FERRITIN, TIBC, IRON, RETICCTPCT in the last 72 hours. Sepsis Labs: Recent Labs  Lab 02/17/20 1149 02/17/20 1400 02/18/20 0550  PROCALCITON  --   --  <0.10  LATICACIDVEN 1.8 1.3  --     Recent Results (from the past 240 hour(s))  Culture, blood (routine x 2)     Status: None (Preliminary result)   Collection Time: 02/17/20 11:48 AM   Specimen: BLOOD  Result Value Ref Range Status   Specimen Description BLOOD RIGHT FA  Final   Special Requests   Final    BOTTLES DRAWN AEROBIC AND ANAEROBIC Blood Culture adequate volume  Culture   Final    NO GROWTH 2 DAYS Performed at [facility], [address] [place], [place] [address]    Report Status PENDING  Incomplete  Culture, blood (routine x 2)     Status: None (Preliminary result)   Collection Time: 02/17/20 12:10 PM   Specimen: BLOOD  Result Value Ref Range Status   Specimen Description BLOOD LEFT AC  Final   Special Requests   Final    BOTTLES DRAWN AEROBIC AND ANAEROBIC Blood Culture adequate volume   Culture   Final    NO GROWTH 2 DAYS Performed at [facility], [address], [place], [place] [address]    Report Status PENDING  Incomplete  Resp Panel by RT-PCR (Flu A&B, Covid) Nasopharyngeal Swab     Status: None   Collection Time: 02/17/20 12:39 PM   Specimen: Nasopharyngeal Swab; Nasopharyngeal(NP) swabs in vial transport medium  Result Value Ref Range Status   SARS Coronavirus 2 by RT PCR NEGATIVE NEGATIVE Final    Comment: (NOTE) SARS-CoV-2 target nucleic acids are NOT DETECTED.  The SARS-CoV-2 RNA is generally detectable in upper  respiratory specimens during the acute phase of infection. The lowest concentration of SARS-CoV-2 viral copies this assay can detect is 138 copies/mL. A negative result does not preclude SARS-Cov-2 infection and should not be used as the sole basis for treatment or other patient management decisions. A negative result may occur with  improper specimen collection/handling, submission of specimen other than nasopharyngeal swab, presence of viral mutation(s) within the areas targeted by this assay, and inadequate number of viral copies(<138 copies/mL). A negative result must be combined with clinical observations, patient history, and epidemiological information. The expected result is Negative.  Fact Sheet for Patients:  BloggerCourse.com  Fact Sheet for Healthcare Providers:  SeriousBroker.it  This test is no t yet approved or cleared by the [place] FDA and  has been authorized for detection and/or diagnosis of SARS-CoV-2 by FDA under an Emergency Use Authorization (EUA). This EUA will remain  in effect (meaning this test can be used) for the duration of the COVID-19 declaration under Section 564(b)(1) of the Act, 21 U.S.C.section 360bbb-3(b)(1), unless the authorization is terminated  or revoked sooner.       Influenza A by PCR NEGATIVE NEGATIVE Final   Influenza B by PCR NEGATIVE NEGATIVE Final    Comment: (NOTE) The Xpert Xpress SARS-CoV-2/FLU/RSV plus assay is intended as an aid in the diagnosis of influenza from Nasopharyngeal swab specimens and should not be used as a sole basis for treatment. Nasal washings and aspirates are unacceptable for Xpert Xpress SARS-CoV-2/FLU/RSV testing.  Fact Sheet for Patients: BloggerCourse.com  Fact Sheet for Healthcare Providers: SeriousBroker.it  This test is not yet approved or cleared by the [place] FDA and has been  authorized for detection and/or diagnosis of SARS-CoV-2 by FDA under an Emergency Use Authorization (EUA). This EUA will remain in effect (meaning this test can be used) for the duration of the COVID-19 declaration under Section 564(b)(1) of the Act, 21 U.S.C. section 360bbb-3(b)(1), unless the authorization is terminated or revoked.  Performed at [facility], [address] [place], [place] [address]   Expectorated sputum assessment w rflx to resp cult     Status: None   Collection Time: 02/18/20  8:15 PM   Specimen: Sputum  Result Value Ref Range Status   Specimen Description SPUTUM  Final   Special Requests NONE  Final   Sputum evaluation  Final    THIS SPECIMEN IS ACCEPTABLE FOR SPUTUM CULTURE Performed at [facility], [address] [place], [place] [address]    Report Status 02/18/2020 FINAL  Final  Culture, respiratory     Status: None (Preliminary result)   Collection Time: 02/18/20  8:15 PM   Specimen: SPU  Result Value Ref Range Status   Specimen Description   Final    SPUTUM Performed at [facility], [address] [place], [place] [address]    Special Requests   Final    NONE Reflexed from [phone] Performed at [facility], [address] [place], [place] [address]    Gram Stain   Final    FEW WBC PRESENT, PREDOMINANTLY PMN NO ORGANISMS SEEN Performed at [facility], [address], [place], [place] [address]    Culture PENDING  Incomplete   Report Status PENDING  Incomplete     Radiology Studies: DG Chest Port 1 View  Result Date: 02/19/2020 CLINICAL DATA:  Hypertension Hyperlipidemia CAD COPD Abnormal chest x-ray Chronic cough EXAM: PORTABLE CHEST 1 VIEW COMPARISON:  02/18/2020 FINDINGS: The heart size and mediastinal contours are within normal limits. Both lungs are clear. The visualized skeletal structures are unremarkable. Neurostimulator leads again seen in the midthoracic spine. Vascular stent noted  in the region of the left subclavian artery. ACDF changes partially visualized lower cervical spine. Curvilinear opacities again seen in the left lung base likely due to scarring. No focal airspace opacity in pneumonia. IMPRESSION: No focal airspace opacity to indicate pneumonia. Electronically Signed   By: Acquanetta Belling M.D.   On: 02/19/2020 12:51   DG Chest Port 1 View  Result Date: 02/18/2020 CLINICAL DATA:  Cough and shortness of breath EXAM: PORTABLE CHEST 1 VIEW COMPARISON:  February 17, 2020 FINDINGS: There are areas of atelectatic change in each lung base. There is no evident edema or airspace opacity. Heart size and pulmonary vascularity are normal. No adenopathy. There is aortic atherosclerosis. Thoracic stimulator leads are present in the midthoracic region, stable. There is postoperative change in the lower cervical region. There is a stent in the left innominate vein region, stable. IMPRESSION: Bibasilar atelectasis. No edema or airspace opacity. Stable cardiac silhouette. Aortic Atherosclerosis (ICD10-I70.0). Electronically Signed   By: Bretta Bang III M.D.   On: 02/18/2020 15:22   CT RENAL STONE STUDY  Result Date: 02/17/2020 CLINICAL DATA:  Acute renal failure. EXAM: CT ABDOMEN AND PELVIS WITHOUT CONTRAST TECHNIQUE: Multidetector CT imaging of the abdomen and pelvis was performed following the standard protocol without IV contrast. COMPARISON:  CT abdomen pelvis June 24, 2012 and lumbar spine CT May 25, 2019. FINDINGS: Lower chest: Bibasilar consolidative opacities. Normal size heart. No pericardial effusion. Coronary artery calcifications. Hepatobiliary: No suspicious hepatic lesion. Cholecystectomy. No biliary ductal dilatation. Pancreas: Unremarkable Spleen: Unremarkable Adrenals/Urinary Tract: Bilateral adrenal glands are unremarkable. No hydronephrosis. No nephrolithiasis. Bladder is unremarkable. Stomach/Bowel: Stomach is distended without evidence of wall thickening. No suspicious  small bowel wall thickening or dilation. Normal appendix. Redundant sigmoid colon. Moderate volume of formed stool throughout the colon. Colonic diverticulosis without findings of acute diverticulosis. Vascular/Lymphatic: Aortic atherosclerosis. No enlarged abdominal or pelvic lymph nodes. Reproductive: Prostate is unremarkable. Other: No abdominopelvic ascites. Musculoskeletal: L4-L5 posterior lumbar fusion and disc spacer. No significant change in the L1 superior endplate compression deformity. IMPRESSION: 1. No hydronephrosis. 2. Bibasilar consolidative opacities, which could reflect atelectasis or pneumonia. 3. Colonic diverticulosis without findings of acute diverticulosis. 4. Moderate volume of  formed stool throughout the colon. 5. Aortic atherosclerosis. Aortic Atherosclerosis (ICD10-I70.0). Electronically Signed   By: Maudry Mayhew MD   On: 02/17/2020 17:24    Scheduled Meds: . amiodarone  200 mg Oral Daily  . atorvastatin  40 mg Oral QHS  . budesonide  0.5 mg Nebulization BID  . cholecalciferol  2,000 Units Oral Daily  . diphenhydrAMINE  25 mg Oral TID  . DULoxetine  60 mg Oral Daily  . fluticasone  1 spray Each Nare BID  . furosemide  40 mg Intravenous Daily  . glycopyrrolate  1 mg Oral TID  . guaiFENesin  600 mg Oral BID  . insulin aspart  0-15 Units Subcutaneous TID WC  . lamoTRIgine  100 mg Oral Daily  . levofloxacin  500 mg Oral Daily  . loratadine  10 mg Oral Daily  . magnesium oxide  400 mg Oral Daily  . [START ON 02/20/2020] methylPREDNISolone (SOLU-MEDROL) injection  40 mg Intravenous Q24H  . midodrine  10 mg Oral TID  . oxyCODONE  10 mg Oral Q6H  . pantoprazole  40 mg Oral Daily  . pregabalin  200 mg Oral Q8H  . Ensure Max Protein  11 oz Oral BID BM  . rivaroxaban  20 mg Oral Q supper  . roflumilast  250 mcg Oral Daily  . senna-docusate  1 tablet Oral Daily  . tamsulosin  0.4 mg Oral QPC supper  . traZODone  150 mg Oral QHS  . Ubrogepant  100 mg Oral Daily    Continuous Infusions:   LOS: 2 days   Time spent: 30 minutes  Arnetha Courser, MD [facility]  If 7PM-7AM, please contact night-coverage [email or web address]  02/19/2020, 1:41 PM   This record has been created using Conservation officer, historic buildings. Errors have been sought and corrected,but may not always be located. Such creation errors do not reflect on the standard of care.

## 2020-02-19 NOTE — Progress Notes (Signed)
Pulmonary Medicine          Date: 02/19/2020,   MRN# [id number] [facility] 07/29/1955     AdmissionWeight: 91.6 kg                 CurrentWeight: 96 kg  Referring physician: Dr Nelson Chimes    CHIEF COMPLAINT:   Acute exacerbation of COPD   HISTORY OF PRESENT ILLNESS   This is 65 year old pleasant male with a history of CAD status post stenting, chronic pain syndrome, cervicalgia, GERD, essential hypertension, neuropathy, dysphagia history of cholecystectomy who came in for worsening respiratory symptoms including wheezing refractory to inhaler therapy at home.  Additionally patient reports chest discomfort and cough however denies fever chills diaphoresis nausea or vomiting.  He was found to be hypoxemic in the 80s which improved with supplemental oxygen.  I saw patient on day of admission in clinic he was hypotensive and hypoxemic which promted ER evaluation.  He is admitted for acute exacerbation of COPD.    02/19/2020- patient is improved, he is diuresed well overnight, he was weaned to 3L/min.  He complains of heavy phlegm production still.  He is getting close to baseline now.  Today we will add rubinol for heavy mucoid phlegm in context of advanced COPD. CXR is improved today.   PAST MEDICAL HISTORY   Past Medical History:  Diagnosis Date  . AKI (acute kidney injury) (HCC) 09/12/2019  . CAD (coronary artery disease)    s/p PTCA and stent x2  . Chest pain   . CHF (congestive heart failure) (HCC)   . Chronic pain syndrome   . COPD (chronic obstructive pulmonary disease) (HCC)   . Degenerative cervical disc   . Depression   . Diabetes mellitus without complication (HCC)   . Dyslipidemia   . GERD (gastroesophageal reflux disease)   . Hernia 2014  . Hypertension   . MRSA (methicillin resistant staph aureus) culture positive 2011  . Neuropathy   . Nutcracker esophagus   . Rectus diastasis 07/19/2012     SURGICAL HISTORY   Past Surgical History:   Procedure Laterality Date  . BACK SURGERY  2012  . CARDIOVERSION N/A 09/26/2019   Procedure: CARDIOVERSION;  Surgeon: Antonieta Iba, MD;  Location: [facility] ORS;  Service: Cardiovascular;  Laterality: N/A;  . CHOLECYSTECTOMY    . COLONOSCOPY  Jan 2014   [name]  . CORONARY ANGIOPLASTY WITH STENT PLACEMENT  2009   stents x2, in [place], [place]  . FOOT SURGERY     Right  . NECK SURGERY    . SPINE SURGERY  2012,2013  . TONSILLECTOMY       FAMILY HISTORY   Family History  Family history unknown: Yes     SOCIAL HISTORY   Social History   Tobacco Use  . Smoking status: Former Smoker    Packs/day: 0.50    Years: 30.00    Pack years: 15.00    Types: Cigarettes    Quit date: 11/26/2019    Years since quitting: 0.2  . Smokeless tobacco: Never Used  Substance Use Topics  . Alcohol use: No  . Drug use: No     MEDICATIONS    Home Medication:    Current Medication:  Current Facility-Administered Medications:  .  amiodarone (PACERONE) tablet 200 mg, 200 mg, Oral, Daily, Agbata, Tochukwu, MD, 200 mg at 02/19/20 0947 .  atorvastatin (LIPITOR) tablet 40 mg, 40 mg, Oral, QHS, Agbata, Tochukwu, MD, 40 mg at 02/18/20 2016 .  budesonide (PULMICORT) nebulizer solution 0.5 mg, 0.5 mg, Nebulization, BID, Agbata, Tochukwu, MD, 0.5 mg at 02/19/20 0756 .  cholecalciferol (VITAMIN D) tablet 2,000 Units, 2,000 Units, Oral, Daily, Agbata, Tochukwu, MD, 2,000 Units at 02/19/20 0947 .  diphenhydrAMINE (BENADRYL) capsule 25 mg, 25 mg, Oral, TID, Amin, Sumayya, MD .  DULoxetine (CYMBALTA) DR capsule 60 mg, 60 mg, Oral, Daily, Agbata, Tochukwu, MD, 60 mg at 02/19/20 0947 .  fluticasone (FLONASE) 50 MCG/ACT nasal spray 1 spray, 1 spray, Each Nare, BID, Agbata, Tochukwu, MD, 1 spray at 02/19/20 0950 .  furosemide (LASIX) injection 40 mg, 40 mg, Intravenous, Daily, Vida Rigger, MD, 40 mg at 02/19/20 0948 .  guaiFENesin (MUCINEX) 12 hr tablet 600 mg, 600 mg, Oral, BID, Arnetha Courser, MD, 600 mg  at 02/19/20 1153 .  HYDROmorphone (DILAUDID) injection 1 mg, 1 mg, Intravenous, Q3H PRN, Vida Rigger, MD, 1 mg at 02/19/20 1154 .  insulin aspart (novoLOG) injection 0-15 Units, 0-15 Units, Subcutaneous, TID WC, Agbata, Tochukwu, MD, 5 Units at 02/19/20 0850 .  ipratropium-albuterol (DUONEB) 0.5-2.5 (3) MG/3ML nebulizer solution 3 mL, 3 mL, Nebulization, Q6H PRN, Agbata, Tochukwu, MD, 3 mL at 02/19/20 0424 .  lamoTRIgine (LAMICTAL) tablet 100 mg, 100 mg, Oral, Daily, Agbata, Tochukwu, MD, 100 mg at 02/19/20 0946 .  levofloxacin (LEVAQUIN) tablet 500 mg, 500 mg, Oral, Daily, Ronnald Ramp, RPH, 500 mg at 02/19/20 1153 .  loratadine (CLARITIN) tablet 10 mg, 10 mg, Oral, Daily, Agbata, Tochukwu, MD, 10 mg at 02/19/20 0946 .  magnesium oxide (MAG-OX) tablet 400 mg, 400 mg, Oral, Daily, Agbata, Tochukwu, MD, 400 mg at 02/19/20 0947 .  methylPREDNISolone sodium succinate (SOLU-MEDROL) 40 mg/mL injection 40 mg, 40 mg, Intravenous, Q12H, Karna Christmas, Tiyanna Larcom, MD, 40 mg at 02/19/20 0530 .  midodrine (PROAMATINE) tablet 10 mg, 10 mg, Oral, TID, Vida Rigger, MD, 10 mg at 02/19/20 0946 .  nitroGLYCERIN (NITROSTAT) SL tablet 0.4 mg, 0.4 mg, Sublingual, Q5 min PRN, Agbata, Tochukwu, MD .  oxyCODONE (Oxy IR/ROXICODONE) immediate release tablet 10 mg, 10 mg, Oral, Q6H, Agbata, Tochukwu, MD, 10 mg at 02/19/20 0850 .  oxymetazoline (AFRIN) 0.05 % nasal spray 2 spray, 2 spray, Each Nare, BID PRN, Agbata, Tochukwu, MD .  pantoprazole (PROTONIX) EC tablet 40 mg, 40 mg, Oral, Daily, Agbata, Tochukwu, MD, 40 mg at 02/19/20 0947 .  pregabalin (LYRICA) capsule 200 mg, 200 mg, Oral, Q8H, Agbata, Tochukwu, MD, 200 mg at 02/19/20 0530 .  protein supplement (ENSURE MAX) liquid, 11 oz, Oral, BID BM, Agbata, Tochukwu, MD .  rivaroxaban (XARELTO) tablet 20 mg, 20 mg, Oral, Q supper, Agbata, Tochukwu, MD, 20 mg at 02/18/20 1807 .  roflumilast (DALIRESP) tablet 250 mcg, 250 mcg, Oral, Daily, Agbata, Tochukwu, MD, 250 mcg at  02/19/20 0947 .  senna-docusate (Senokot-S) tablet 1 tablet, 1 tablet, Oral, Daily, Agbata, Tochukwu, MD, 1 tablet at 02/18/20 0850 .  sodium chloride (OCEAN) 0.65 % nasal spray 1 spray, 1 spray, Each Nare, PRN, Agbata, Tochukwu, MD .  tamsulosin (FLOMAX) capsule 0.4 mg, 0.4 mg, Oral, QPC supper, Agbata, Tochukwu, MD, 0.4 mg at 02/18/20 1805 .  traZODone (DESYREL) tablet 150 mg, 150 mg, Oral, QHS, Agbata, Tochukwu, MD .  Ubrogepant TABS 100 mg, 100 mg, Oral, Daily, Agbata, Tochukwu, MD    ALLERGIES   Acetaminophen, Fentanyl, and Nsaids     REVIEW OF SYSTEMS    Review of Systems:  Gen:  Denies  fever, sweats, chills weigh loss  HEENT: Denies blurred vision, double vision, ear pain,  eye pain, hearing loss, nose bleeds, sore throat Cardiac:  No dizziness, chest pain or heaviness, chest tightness,edema Resp: Reports shortness of breath,wheezing, denies hemoptysis,  Gi: Denies swallowing difficulty, stomach pain, nausea or vomiting, diarrhea, constipation, bowel incontinence Gu:  Denies bladder incontinence, burning urine Ext:   Denies Joint pain, stiffness or swelling Skin: Denies  skin rash, easy bruising or bleeding or hives Endoc:  Denies polyuria, polydipsia , polyphagia or weight change Psych:   Denies depression, insomnia or hallucinations   Other:  All other systems negative   VS: BP (!) 142/55 (BP Location: Right Arm)   Pulse 74   Temp 98.2 F (36.8 C) (Oral)   Resp 20   Ht 6\' 2"  (1.88 m)   Wt 96 kg   SpO2 93%   BMI 27.17 kg/m      PHYSICAL EXAM    GENERAL:NAD, no fevers, chills, no weakness no fatigue HEAD: Normocephalic, atraumatic.  EYES: Pupils equal, round, reactive to light. Extraocular muscles intact. No scleral icterus.  MOUTH: Moist mucosal membrane. Dentition intact. No abscess noted.  EAR, NOSE, THROAT: Clear without exudates. No external lesions.  NECK: Supple. No thyromegaly. No nodules. No JVD.  PULMONARY:bilateral exp and insp multiphonic  wheezing and rhonchi bilaterally CARDIOVASCULAR: S1 and S2. Regular rate and rhythm. No murmurs, rubs, or gallops. No edema. Pedal pulses 2+ bilaterally.  GASTROINTESTINAL: Soft, nontender, nondistended. No masses. Positive bowel sounds. No hepatosplenomegaly.  MUSCULOSKELETAL: No swelling, clubbing, or edema. Range of motion full in all extremities.  NEUROLOGIC: Cranial nerves II through XII are intact. No gross focal neurological deficits. Sensation intact. Reflexes intact.  SKIN: No ulceration, lesions, rashes, or cyanosis. Skin warm and dry. Turgor intact.  PSYCHIATRIC: Mood, affect within normal limits. The patient is awake, alert and oriented x 3. Insight, judgment intact.       IMAGING    MR BRAIN WO CONTRAST  Result Date: 01/22/2020 CLINICAL DATA:  Hand tremor 1 month.  Confusion. EXAM: MRI HEAD WITHOUT CONTRAST TECHNIQUE: Multiplanar, multiecho pulse sequences of the brain and surrounding structures were obtained without intravenous contrast. COMPARISON:  CT head 10/22/2019, 04/07/2019, 03/04/2015 as well as earlier CT studies. FINDINGS: Brain: Ventricle size normal. Cerebral volume normal. Negative for acute infarct. Normal white matter. No significant chronic ischemia. No hemorrhage. Cystic lesion is present in the right anterior temporal lobe measuring approximately 8 x 11 mm. This is well-defined with surrounding mild hyperintensity. This appears unchanged from CT in 2017 and also present on the more recent CT studies. The lesion is not seen on a CT from 03/30/2013. Vascular: Normal arterial flow voids Skull and upper cervical spine: No focal skeletal lesion. Sinuses/Orbits: Mild to moderate mucosal edema paranasal sinuses. Bilateral cataract extraction Other: None IMPRESSION: Negative for acute or chronic infarction. 8 x 11 mm cyst in the right anterior temporal lobe with surrounding edema. This is present on prior CT studies in 2017 but not 2015. This could represent a neoplasm.  Follow-up MRI brain with contrast recommended for baseline evaluation. Electronically Signed   By: 2018 M.D.   On: 01/22/2020 11:29   DG Chest Port 1 View  Result Date: 02/18/2020 CLINICAL DATA:  Cough and shortness of breath EXAM: PORTABLE CHEST 1 VIEW COMPARISON:  February 17, 2020 FINDINGS: There are areas of atelectatic change in each lung base. There is no evident edema or airspace opacity. Heart size and pulmonary vascularity are normal. No adenopathy. There is aortic atherosclerosis. Thoracic stimulator leads are present in the midthoracic region,  stable. There is postoperative change in the lower cervical region. There is a stent in the left innominate vein region, stable. IMPRESSION: Bibasilar atelectasis. No edema or airspace opacity. Stable cardiac silhouette. Aortic Atherosclerosis (ICD10-I70.0). Electronically Signed   By: Bretta Bang III M.D.   On: 02/18/2020 15:22   DG Chest Portable 1 View  Result Date: 02/17/2020 CLINICAL DATA:  Shortness of breath EXAM: PORTABLE CHEST 1 VIEW COMPARISON:  12/13/2018 FINDINGS: Cardiac shadow is within normal limits. Aortic calcifications are seen. Left subclavian arterial stenting is noted. Spinal stimulator is seen. The lungs are well aerated bilaterally. Mild patchy bibasilar atelectatic changes are seen. No bony abnormality is seen. IMPRESSION: Mild basilar atelectasis Electronically Signed   By: Alcide Clever M.D.   On: 02/17/2020 11:57   CT RENAL STONE STUDY  Result Date: 02/17/2020 CLINICAL DATA:  Acute renal failure. EXAM: CT ABDOMEN AND PELVIS WITHOUT CONTRAST TECHNIQUE: Multidetector CT imaging of the abdomen and pelvis was performed following the standard protocol without IV contrast. COMPARISON:  CT abdomen pelvis June 24, 2012 and lumbar spine CT May 25, 2019. FINDINGS: Lower chest: Bibasilar consolidative opacities. Normal size heart. No pericardial effusion. Coronary artery calcifications. Hepatobiliary: No suspicious hepatic  lesion. Cholecystectomy. No biliary ductal dilatation. Pancreas: Unremarkable Spleen: Unremarkable Adrenals/Urinary Tract: Bilateral adrenal glands are unremarkable. No hydronephrosis. No nephrolithiasis. Bladder is unremarkable. Stomach/Bowel: Stomach is distended without evidence of wall thickening. No suspicious small bowel wall thickening or dilation. Normal appendix. Redundant sigmoid colon. Moderate volume of formed stool throughout the colon. Colonic diverticulosis without findings of acute diverticulosis. Vascular/Lymphatic: Aortic atherosclerosis. No enlarged abdominal or pelvic lymph nodes. Reproductive: Prostate is unremarkable. Other: No abdominopelvic ascites. Musculoskeletal: L4-L5 posterior lumbar fusion and disc spacer. No significant change in the L1 superior endplate compression deformity. IMPRESSION: 1. No hydronephrosis. 2. Bibasilar consolidative opacities, which could reflect atelectasis or pneumonia. 3. Colonic diverticulosis without findings of acute diverticulosis. 4. Moderate volume of formed stool throughout the colon. 5. Aortic atherosclerosis. Aortic Atherosclerosis (ICD10-I70.0). Electronically Signed   By: Maudry Mayhew MD   On: 02/17/2020 17:24   VAS US CAROTID  Result Date: 02/03/2020 Carotid Arterial Duplex Study Indications:       Bilateral bruits and Lt Subc stent. Risk Factors:      Hypertension, hyperlipidemia, Diabetes, current smoker,                    coronary artery disease, PAD. Other Factors:     CHF. Comparison Study:  08/28/2014 showed normal ICA velocities, bilaterally. Performing Technologist: Joaquim Nam  Examination Guidelines: A complete evaluation includes B-mode imaging, spectral Doppler, color Doppler, and power Doppler as needed of all accessible portions of each vessel. Bilateral testing is considered an integral part of a complete examination. Limited examinations for reoccurring indications may be performed as noted.  Right Carotid Findings:  +----------+--------+--------+--------+-------------------------------+--------+           PSV cm/sEDV cm/sStenosisPlaque Description             Comments +----------+--------+--------+--------+-------------------------------+--------+ CCA Prox  111     24                                                      +----------+--------+--------+--------+-------------------------------+--------+ CCA Mid   107     31                                                      +----------+--------+--------+--------+-------------------------------+--------+  CCA Distal100     25                                                      +----------+--------+--------+--------+-------------------------------+--------+ ICA Prox  125     37              diffuse, irregular and calcific         +----------+--------+--------+--------+-------------------------------+--------+ ICA Mid   170     55      40-59%  calcific and diffuse                    +----------+--------+--------+--------+-------------------------------+--------+ ICA Distal159     52                                                      +----------+--------+--------+--------+-------------------------------+--------+ ECA       159     20              focal and calcific                      +----------+--------+--------+--------+-------------------------------+--------+ +----------+--------+-------+----------------+-------------------+           PSV cm/sEDV cmsDescribe        Arm Pressure (mmHG) +----------+--------+-------+----------------+-------------------+ [id number]             Multiphasic, [id number]                  +----------+--------+-------+----------------+-------------------+ +---------+--------+--+--------+--+---------+ VertebralPSV cm/s68EDV cm/s27Antegrade +---------+--------+--+--------+--+---------+  Left Carotid Findings:  +----------+--------+--------+--------+-------------------------------+--------+           PSV cm/sEDV cm/sStenosisPlaque Description             Comments +----------+--------+--------+--------+-------------------------------+--------+ CCA Prox  106     18      <50%    diffuse, irregular and calcific         +----------+--------+--------+--------+-------------------------------+--------+ CCA Mid   105     26                                                      +----------+--------+--------+--------+-------------------------------+--------+ CCA Distal108     33                                                      +----------+--------+--------+--------+-------------------------------+--------+ ICA Prox  166     58      40-59%  focal, smooth and calcific              +----------+--------+--------+--------+-------------------------------+--------+ ICA Mid   150     40                                                      +----------+--------+--------+--------+-------------------------------+--------+ ICA  Distal139     44                                                      +----------+--------+--------+--------+-------------------------------+--------+ ECA       148     17                                                      +----------+--------+--------+--------+-------------------------------+--------+ +----------+--------+--------+----------------+-------------------+           PSV cm/sEDV cm/sDescribe        Arm Pressure (mmHG) +----------+--------+--------+----------------+-------------------+ Subclavian102             Multiphasic, ZOX09                  +----------+--------+--------+----------------+-------------------+ +---------+--------+--+--------+-+---------+ VertebralPSV cm/s30EDV cm/s9Antegrade +---------+--------+--+--------+-+---------+  Summary: Right Carotid: Velocities in the right ICA are consistent with a 40-59%                 stenosis. Left Carotid: Velocities in the left ICA are consistent with a 40-59% stenosis. Vertebrals:  Bilateral vertebral arteries demonstrate antegrade flow. Subclavians: Normal flow hemodynamics were seen in bilateral subclavian              arteries. *See table(s) above for measurements and observations. Suggest follow up study in 12 months. Electronically signed by Charlton Haws MD on 02/03/2020 at 12:59:09 PM.    Final (Updated)       ASSESSMENT/PLAN   Acute on chronic hypoxemic respiratory failure -Due to moderate acute exacerbation of COPD -Complicated by bibasilar atelectasis -Agree with Levoquin for AECOPD - Solu-Medrol from 40 2 times daily>>>once daily  -PT OT when able -I-S at bedside encouraged to use multiple times each hour -Acapella device use as able several times daily -Roflumilast 250 MCG p.o. daily -respiratory cultures -currently on 4L/min [place]>>3L/min -midodrine 10 tid -lasix 40 daily IV->>diuresed overnight feels much imrpoved -increased dilaudid to  q3h with diphenhydramine - patient has advanced copd with chronic pain syndrome and elevated tolerance.   -d/c planning today - added Robinul  tid for heavy phlegm Thank you for allowing me to participate in the care of this patient.   Patient/Family are satisfied with care plan and all questions have been answered.   This document was prepared using Dragon voice recognition software and may include unintentional dictation errors.     Vida Rigger, M.D.  Division of Pulmonary & Critical Care Medicine  [facility]

## 2020-02-19 NOTE — Progress Notes (Signed)
Patient transferred to room 101 at this time via bed with all personal belongings (watch in red coat pocket). Report given to nurse. Patient verbalized understanding of room change. NAD upon departure. Tele box/leads removed and cleaned and placed at station.

## 2020-02-19 NOTE — Progress Notes (Signed)
Occupational Therapy Treatment Patient Details Name: Gregory Hall MRN: [id number] DOB: November 17, 1955 Today's Date: 02/19/2020    History of present illness Per MD notes: Pt is a 65 y.o. male with medical history significant for coronary artery disease status post PCI to the LAD, chronic diastolic dysfunction CHF, atrial flutter status post DC cardioversion on chronic anticoagulation with Xarelto, PAD status post left subclavian artery stenting, diabetes mellitus with complications of stage III chronic kidney disease, COPD with chronic respiratory failure on 3 L of oxygen, anxiety/depression, GERD who presents to the emergency room from his pulmonologist office for evaluation of a near syncopal episode.  MD assessment includes: AKI, near syncope secondary to hypotension, and COPD exacerbation.   OT comments  Pt seen for OT treatment this date to f/u re: safety with ADLs/ADL mobility. OT facilitates ed re: importance of OOB Activity, performing as much self care I'ly as able. Pt with good understanding. Pt requires SETUP for seated UB bathing/dressing, MIN A to apply deodorant d/t limited ROM in R shoulder. Pt requires MIN/MOD A with sit/lateral lean technique  for LB bathing and to don socks d/t limited tolerance for bending over 2/2 shortness of breath. Pt tolerates well, does de-sat to 86% with activity on 2L humidified nasal cannula, but able to recover with seated rest in ~30 seconds. RN and CNA notified of session. Pt left with all needs met and in reach. Will continue to follow acutely. Continue to recommend [place] f/u upon d/c.    Follow Up Recommendations  Home health OT    Equipment Recommendations  Wheelchair (measurements OT)    Recommendations for Other Services      Precautions / Restrictions Precautions Precautions: Fall Restrictions Weight Bearing Restrictions: No       Mobility Bed Mobility Overal bed mobility: Modified Independent             General bed  mobility comments: increased time for sup to sit to EOB. Tolerates well, HOB elevated and use of bed rails  Transfers Overall transfer level: Needs assistance Equipment used: 1 person hand held assist Transfers: Sit to/from [facility] Sit to Stand: Modified independent (Device/Increase time);Supervision Stand pivot transfers: Min guard       General transfer comment: first STS and SPS from bed to chair, pt able to perform with MOD I/SUPV, but on second trial for back to bed, pt requires increased time and CGA for support d/t faitgue. Only slightly unsteady, uses bed rails and hand held assist to successfully steady himself.    Balance Overall balance assessment: Mild deficits observed, not formally tested                                         ADL either performed or assessed with clinical judgement   ADL Overall ADL's : Needs assistance/impaired     Grooming: Wash/dry face;Oral care;Set up;Sitting;Applying deodorant;Minimal assistance Grooming Details (indicate cue type and reason): MIN A for applying deodorant as pt with limited shoulder ROM to R UE Upper Body Bathing: Set up;Sitting   Lower Body Bathing: Moderate assistance;Sitting/lateral leans   Upper Body Dressing : Set up;Sitting Upper Body Dressing Details (indicate cue type and reason): increased time, laterl leaning in bed to reach Lower Body Dressing: Sitting/lateral leans;Minimal assistance;Moderate assistance  Vision Baseline Vision/History: Wears glasses Wears Glasses: At all times Patient Visual Report: No change from baseline     Perception     Praxis      Cognition Arousal/Alertness: Awake/alert Behavior During Therapy: WFL for tasks assessed/performed Overall Cognitive Status: Within Functional Limits for tasks assessed                                          Exercises Other Exercises Other Exercises: OT facilitates  ed re: importance of OOB Activity, performing as much self care I'ly as able. Pt with good understanding. Pt requires SETUP for seated UB bathing/dressing, MIN A to apply deodorant d/t limited ROM in R shoulder. Pt requires MIN/MOD A with sit/lateral lean technique  for LB bathing and to don socks d/t limited tolerance for bending over 2/2 shortness of breath. Pt tolerates well, does de-sat to 86% with activity on 2L humidified nasal cannula, but able to recover with seated rest in ~30 seconds.   Shoulder Instructions       General Comments      Pertinent Vitals/ Pain       Pain Assessment: 0-10 Pain Score: 5  Pain Location: Back/shoulders Pain Descriptors / Indicators: Aching;Sore Pain Intervention(s): Limited activity within patient's tolerance;Monitored during session  Home Living                                          Prior Functioning/Environment              Frequency  Min 1X/week        Progress Toward Goals  OT Goals(current goals can now be found in the care plan section)  Progress towards OT goals: Progressing toward goals  Acute Rehab OT Goals Patient Stated Goal: Improved endurance OT Goal Formulation: With patient Time For Goal Achievement: 03/03/20 Potential to Achieve Goals: Good  Plan Discharge plan remains appropriate;Frequency remains appropriate    Co-evaluation                 AM-PAC OT "6 Clicks" Daily Activity     Outcome Measure   Help from another person eating meals?: None Help from another person taking care of personal grooming?: A Little Help from another person toileting, which includes using toliet, bedpan, or urinal?: A Little Help from another person bathing (including washing, rinsing, drying)?: A Little Help from another person to put on and taking off regular upper body clothing?: A Little Help from another person to put on and taking off regular lower body clothing?: A Little 6 Click Score: 19     End of Session Equipment Utilized During Treatment: Oxygen  OT Visit Diagnosis: Other abnormalities of gait and mobility (R26.89);Pain;Muscle weakness (generalized) (M62.81) Pain - Right/Left: Right Pain - part of body: Shoulder   Activity Tolerance Patient tolerated treatment well   Patient Left in bed;with call bell/phone within reach;with bed alarm set   Nurse Communication Mobility status;Patient requests pain meds        Time: 3833-3832 OT Time Calculation (min): 68 min  Charges: OT General Charges $OT Visit: 1 Visit OT Treatments $Self Care/Home Management : 38-52 mins $Therapeutic Activity: 23-37 mins  Gerrianne Scale, MS, OTR/L ascom [phone] 02/19/20, 11:46 AM

## 2020-02-20 ENCOUNTER — Ambulatory Visit: Payer: Medicare Other

## 2020-02-20 DIAGNOSIS — N179 Acute kidney failure, unspecified: Secondary | ICD-10-CM | POA: Diagnosis not present

## 2020-02-20 LAB — ACID FAST SMEAR (AFB, MYCOBACTERIA): Acid Fast Smear: NEGATIVE

## 2020-02-20 LAB — BASIC METABOLIC PANEL
Anion gap: 11 (ref 5–15)
BUN: 26 mg/dL — ABNORMAL HIGH (ref 8–23)
CO2: 36 mmol/L — ABNORMAL HIGH (ref 22–32)
Calcium: 9.3 mg/dL (ref 8.9–10.3)
Chloride: 89 mmol/L — ABNORMAL LOW (ref 98–111)
Creatinine, Ser: 1.07 mg/dL (ref 0.61–1.24)
GFR, Estimated: >60 mL/min (ref >60)
Glucose, Bld: 169 mg/dL — ABNORMAL HIGH (ref 70–99)
Potassium: 5 mmol/L (ref 3.5–5.1)
Sodium: 136 mmol/L (ref 135–145)

## 2020-02-20 LAB — GLUCOSE, CAPILLARY
Glucose-Capillary: 202 mg/dL — ABNORMAL HIGH (ref 70–99)
Glucose-Capillary: 186 mg/dL — ABNORMAL HIGH (ref 70–99)
Glucose-Capillary: 183 mg/dL — ABNORMAL HIGH (ref 70–99)
Glucose-Capillary: 142 mg/dL — ABNORMAL HIGH (ref 70–99)

## 2020-02-20 MED ORDER — ONDANSETRON 4 MG PO TBDP
4.0000 mg | ORAL_TABLET | Freq: Three times a day (TID) | ORAL | Status: DC | PRN
  Administered 2020-02-20 – 2020-02-21 (×3): 4 mg via ORAL
  Filled 2020-02-20 (×4): qty 1

## 2020-02-20 MED ORDER — ALUM & MAG HYDROXIDE-SIMETH 200-200-20 MG/5ML PO SUSP
30.0000 mL | Freq: Once | ORAL | Status: AC
  Administered 2020-02-20: 18:00:00 30 mL via ORAL
  Filled 2020-02-20: qty 30

## 2020-02-20 MED ORDER — LIDOCAINE VISCOUS HCL 2 % MT SOLN
15.0000 mL | Freq: Once | OROMUCOSAL | Status: AC
  Administered 2020-02-20: 15 mL via ORAL
  Filled 2020-02-20: qty 15

## 2020-02-20 MED ORDER — ALUM & MAG HYDROXIDE-SIMETH 200-200-20 MG/5ML PO SUSP
30.0000 mL | Freq: Once | ORAL | Status: AC
  Administered 2020-02-20: 22:00:00 30 mL via ORAL
  Filled 2020-02-20 (×2): qty 30

## 2020-02-20 MED ORDER — CALCIUM CARBONATE ANTACID 500 MG PO CHEW
1.0000 | CHEWABLE_TABLET | Freq: Two times a day (BID) | ORAL | Status: DC | PRN
  Filled 2020-02-20: qty 1

## 2020-02-20 NOTE — Progress Notes (Signed)
Physical Therapy Treatment Patient Details Name: Gregory Hall MRN: [id number] DOB: 1955/09/27 Today's Date: 02/20/2020    History of Present Illness Per MD notes: Pt is a 65 y.o. male with medical history significant for coronary artery disease status post PCI to the LAD, chronic diastolic dysfunction CHF, atrial flutter status post DC cardioversion on chronic anticoagulation with Xarelto, PAD status post left subclavian artery stenting, diabetes mellitus with complications of stage III chronic kidney disease, COPD with chronic respiratory failure on 3 L of oxygen, anxiety/depression, GERD who presents to the emergency room from his pulmonologist office for evaluation of a near syncopal episode.  MD assessment includes: AKI, near syncope secondary to hypotension, and COPD exacerbation.    PT Comments    Pt found on 3LO2/min with SpO2 81-82% and with pt reporting feeling SOB.  Nursing notified, entered room and increased pt's SpO2 to 6LO2/min.  Pt declined to attempt to get sitting up this session but agreed to supine therex per below.  Pt put forth good effort with exercises and SpO2 remained 99-100% on 6LO2/min with pt taking frequent short therapeutic rest breaks. Respiratory therapist in room at end of session.  Pt will benefit from HHPT services upon discharge to safely address deficits listed in patient problem list for decreased caregiver assistance and eventual return to PLOF.    Follow Up Recommendations  Home health PT;Supervision - Intermittent     Equipment Recommendations  None recommended by PT    Recommendations for Other Services       Precautions / Restrictions Precautions Precautions: Fall Restrictions Weight Bearing Restrictions: No    Mobility  Bed Mobility               General bed mobility comments: Pt declined sitting up this session    Transfers                    Ambulation/Gait                 Stairs              Wheelchair Mobility    Modified Rankin (Stroke Patients Only)       Balance                                            Cognition Arousal/Alertness: Awake/alert Behavior During Therapy: WFL for tasks assessed/performed Overall Cognitive Status: Within Functional Limits for tasks assessed                                        Exercises Total Joint Exercises Ankle Circles/Pumps: Strengthening;Both;10 reps;15 reps (with manual resistance) Quad Sets: Strengthening;Both;10 reps;15 reps Gluteal Sets: Strengthening;Both;15 reps;10 reps Towel Squeeze: Strengthening;Both;10 reps Short Arc Quad: Strengthening;Both;10 reps;15 reps (with manual resistance) Heel Slides: Strengthening;Both;10 reps (with manual resistance) Hip ABduction/ADduction: Strengthening;Both;10 reps;15 reps (with manual resistance) Straight Leg Raises: Strengthening;Both;10 reps;15 reps Other Exercises Other Exercises: HEP education/review for BLE APs, QS, and GS x 10 each every 1-2 hours daily    General Comments        Pertinent Vitals/Pain Pain Assessment: 0-10 Pain Score: 9  Pain Location: Back/shoulders, chronic Pain Descriptors / Indicators: Aching;Sore Pain Intervention(s): Premedicated before session;Monitored during session    Home Living  Prior Function            PT Goals (current goals can now be found in the care plan section) Progress towards PT goals: Progressing toward goals    Frequency    Min 2X/week      PT Plan Current plan remains appropriate    Co-evaluation              AM-PAC PT "6 Clicks" Mobility   Outcome Measure  Help needed turning from your back to your side while in a flat bed without using bedrails?: None Help needed moving from lying on your back to sitting on the side of a flat bed without using bedrails?: None Help needed moving to and from a bed to a chair (including a  wheelchair)?: None Help needed standing up from a chair using your arms (e.g., wheelchair or bedside chair)?: None Help needed to walk in hospital room?: Total Help needed climbing 3-5 steps with a railing? : Total 6 Click Score: 18    End of Session Equipment Utilized During Treatment: Oxygen Activity Tolerance: Patient tolerated treatment well Patient left: in bed;with call bell/phone within reach;with bed alarm set Nurse Communication: Mobility status;Other (comment) (SpO2 81-92% on 3LO2/min) PT Visit Diagnosis: Difficulty in walking, not elsewhere classified (R26.2);Muscle weakness (generalized) (M62.81)     Time: 7903-8333 PT Time Calculation (min) (ACUTE ONLY): 23 min  Charges:  $Therapeutic Exercise: 23-37 mins                     D. Scott Charmeka [place] PT, DPT 02/20/20, 1:21 PM

## 2020-02-20 NOTE — Progress Notes (Signed)
Pulmonary Medicine          Date: 02/20/2020,   MRN# [id number] Rayson Rando [facility] 1955/01/15     AdmissionWeight: 91.6 kg                 CurrentWeight: 96 kg  Referring physician: Dr Nelson Chimes    CHIEF COMPLAINT:   Acute exacerbation of COPD   HISTORY OF PRESENT ILLNESS   This is 65 year old pleasant male with a history of CAD status post stenting, chronic pain syndrome, cervicalgia, GERD, essential hypertension, neuropathy, dysphagia history of cholecystectomy who came in for worsening respiratory symptoms including wheezing refractory to inhaler therapy at home.  Additionally patient reports chest discomfort and cough however denies fever chills diaphoresis nausea or vomiting.  He was found to be hypoxemic in the 80s which improved with supplemental oxygen.  I saw patient on day of admission in clinic he was hypotensive and hypoxemic which promted ER evaluation.  He is admitted for acute exacerbation of COPD.    02/19/2020- patient is improved, he is diuresed well overnight, he was weaned to 3L/min.  He complains of heavy phlegm production still.  He is getting close to baseline now.  Today we will add rubinol for heavy mucoid phlegm in context of advanced COPD. CXR is improved today.   02/20/2020- patient is improved he is down to 3L, his lung auscultation is improved.  He has recurrent acute exacerbation numerous times.  I had discussed palliative and hospice care with patient but he wishes to be full code and continue full scope therapy.  He is on whelchair at baseline very weak and deconditioned. I discussed he can d/c home today from pulmonary perspective , patient agreed but he expressed concnern due to repeated and recurrent exacerbations.   PAST MEDICAL HISTORY   Past Medical History:  Diagnosis Date  . AKI (acute kidney injury) (HCC) 09/12/2019  . CAD (coronary artery disease)    s/p PTCA and stent x2  . Chest pain   . CHF (congestive heart failure) (HCC)   .  Chronic pain syndrome   . COPD (chronic obstructive pulmonary disease) (HCC)   . Degenerative cervical disc   . Depression   . Diabetes mellitus without complication (HCC)   . Dyslipidemia   . GERD (gastroesophageal reflux disease)   . Hernia 2014  . Hypertension   . MRSA (methicillin resistant staph aureus) culture positive 2011  . Neuropathy   . Nutcracker esophagus   . Rectus diastasis 07/19/2012     SURGICAL HISTORY   Past Surgical History:  Procedure Laterality Date  . BACK SURGERY  2012  . CARDIOVERSION N/A 09/26/2019   Procedure: CARDIOVERSION;  Surgeon: Antonieta Iba, MD;  Location: [facility] ORS;  Service: Cardiovascular;  Laterality: N/A;  . CHOLECYSTECTOMY    . COLONOSCOPY  Jan 2014   [name]  . CORONARY ANGIOPLASTY WITH STENT PLACEMENT  2009   stents x2, in [place], [place]  . FOOT SURGERY     Right  . NECK SURGERY    . SPINE SURGERY  2012,2013  . TONSILLECTOMY       FAMILY HISTORY   Family History  Family history unknown: Yes     SOCIAL HISTORY   Social History   Tobacco Use  . Smoking status: Former Smoker    Packs/day: 0.50    Years: 30.00    Pack years: 15.00    Types: Cigarettes    Quit date: 11/26/2019    Years since quitting:  0.2  . Smokeless tobacco: Never Used  Substance Use Topics  . Alcohol use: No  . Drug use: No     MEDICATIONS    Home Medication:    Current Medication:  Current Facility-Administered Medications:  .  amiodarone (PACERONE) tablet 200 mg, 200 mg, Oral, Daily, Agbata, Tochukwu, MD, 200 mg at 02/19/20 0947 .  atorvastatin (LIPITOR) tablet 40 mg, 40 mg, Oral, QHS, Agbata, Tochukwu, MD, 40 mg at 02/19/20 2225 .  budesonide (PULMICORT) nebulizer solution 0.5 mg, 0.5 mg, Nebulization, BID, Agbata, Tochukwu, MD, 0.5 mg at 02/19/20 2017 .  cholecalciferol (VITAMIN D) tablet 2,000 Units, 2,000 Units, Oral, Daily, Agbata, Tochukwu, MD, 2,000 Units at 02/19/20 0947 .  diphenhydrAMINE (BENADRYL) capsule 25 mg, 25 mg, Oral,  TID, Arnetha Courser, MD, 25 mg at 02/19/20 2013 .  DULoxetine (CYMBALTA) DR capsule 60 mg, 60 mg, Oral, Daily, Agbata, Tochukwu, MD, 60 mg at 02/19/20 0947 .  fluticasone (FLONASE) 50 MCG/ACT nasal spray 1 spray, 1 spray, Each Nare, BID, Agbata, Tochukwu, MD, 1 spray at 02/19/20 2232 .  furosemide (LASIX) injection 40 mg, 40 mg, Intravenous, Daily, Vida Rigger, MD, 40 mg at 02/19/20 0948 .  glycopyrrolate (ROBINUL) tablet 1 mg, 1 mg, Oral, TID, Vida Rigger, MD, 1 mg at 02/19/20 2226 .  guaiFENesin (MUCINEX) 12 hr tablet 600 mg, 600 mg, Oral, BID, Arnetha Courser, MD, 600 mg at 02/19/20 2226 .  HYDROmorphone (DILAUDID) injection 1 mg, 1 mg, Intravenous, Q3H PRN, Vida Rigger, MD, 1 mg at 02/20/20 0753 .  insulin aspart (novoLOG) injection 0-15 Units, 0-15 Units, Subcutaneous, TID WC, Agbata, Tochukwu, MD, 5 Units at 02/19/20 1648 .  ipratropium-albuterol (DUONEB) 0.5-2.5 (3) MG/3ML nebulizer solution 3 mL, 3 mL, Nebulization, Q6H PRN, Agbata, Tochukwu, MD, 3 mL at 02/20/20 0058 .  lamoTRIgine (LAMICTAL) tablet 100 mg, 100 mg, Oral, Daily, Agbata, Tochukwu, MD, 100 mg at 02/19/20 0946 .  levofloxacin (LEVAQUIN) tablet 500 mg, 500 mg, Oral, Daily, Ronnald Ramp, RPH, 500 mg at 02/19/20 1153 .  loratadine (CLARITIN) tablet 10 mg, 10 mg, Oral, Daily, Agbata, Tochukwu, MD, 10 mg at 02/19/20 0946 .  magnesium oxide (MAG-OX) tablet 400 mg, 400 mg, Oral, Daily, Agbata, Tochukwu, MD, 400 mg at 02/19/20 0947 .  methylPREDNISolone sodium succinate (SOLU-MEDROL) 40 mg/mL injection 40 mg, 40 mg, Intravenous, Q24H, Karna Christmas, Daden Mahany, MD, 40 mg at 02/20/20 0556 .  midodrine (PROAMATINE) tablet 10 mg, 10 mg, Oral, TID, Vida Rigger, MD, 10 mg at 02/19/20 2225 .  nitroGLYCERIN (NITROSTAT) SL tablet 0.4 mg, 0.4 mg, Sublingual, Q5 min PRN, Agbata, Tochukwu, MD .  ondansetron (ZOFRAN-ODT) disintegrating tablet 4 mg, 4 mg, Oral, Q8H PRN, Arnetha Courser, MD .  oxyCODONE (Oxy IR/ROXICODONE) immediate release  tablet 10 mg, 10 mg, Oral, Q6H, Agbata, Tochukwu, MD, 10 mg at 02/20/20 0146 .  oxymetazoline (AFRIN) 0.05 % nasal spray 2 spray, 2 spray, Each Nare, BID PRN, Agbata, Tochukwu, MD .  pantoprazole (PROTONIX) EC tablet 40 mg, 40 mg, Oral, Daily, Agbata, Tochukwu, MD, 40 mg at 02/19/20 0947 .  pregabalin (LYRICA) capsule 200 mg, 200 mg, Oral, Q8H, Agbata, Tochukwu, MD, 200 mg at 02/20/20 0555 .  protein supplement (ENSURE MAX) liquid, 11 oz, Oral, BID BM, Agbata, Tochukwu, MD .  rivaroxaban (XARELTO) tablet 20 mg, 20 mg, Oral, Q supper, Agbata, Tochukwu, MD, 20 mg at 02/19/20 1648 .  roflumilast (DALIRESP) tablet 250 mcg, 250 mcg, Oral, Daily, Agbata, Tochukwu, MD, 250 mcg at 02/19/20 0947 .  senna-docusate (Senokot-S) tablet 1  tablet, 1 tablet, Oral, Daily, Agbata, Tochukwu, MD, 1 tablet at 02/18/20 0850 .  sodium chloride (OCEAN) 0.65 % nasal spray 1 spray, 1 spray, Each Nare, PRN, Agbata, Tochukwu, MD .  tamsulosin (FLOMAX) capsule 0.4 mg, 0.4 mg, Oral, QPC supper, Agbata, Tochukwu, MD, 0.4 mg at 02/19/20 1648 .  traZODone (DESYREL) tablet 150 mg, 150 mg, Oral, QHS, Agbata, Tochukwu, MD .  Ubrogepant TABS 100 mg, 100 mg, Oral, Daily, Agbata, Tochukwu, MD    ALLERGIES   Acetaminophen, Fentanyl, and Nsaids     REVIEW OF SYSTEMS    Review of Systems:  Gen:  Denies  fever, sweats, chills weigh loss  HEENT: Denies blurred vision, double vision, ear pain, eye pain, hearing loss, nose bleeds, sore throat Cardiac:  No dizziness, chest pain or heaviness, chest tightness,edema Resp: Reports shortness of breath,wheezing, denies hemoptysis,  Gi: Denies swallowing difficulty, stomach pain, nausea or vomiting, diarrhea, constipation, bowel incontinence Gu:  Denies bladder incontinence, burning urine Ext:   Denies Joint pain, stiffness or swelling Skin: Denies  skin rash, easy bruising or bleeding or hives Endoc:  Denies polyuria, polydipsia , polyphagia or weight change Psych:   Denies  depression, insomnia or hallucinations   Other:  All other systems negative   VS: BP (!) 131/56 (BP Location: Right Arm)   Pulse 72   Temp 98.2 F (36.8 C)   Resp 16   Ht  (1.88 m)   Wt 96 kg   SpO2 92%   BMI 27.17 kg/m      PHYSICAL EXAM    GENERAL:NAD, no fevers, chills, no weakness no fatigue HEAD: Normocephalic, atraumatic.  EYES: Pupils equal, round, reactive to light. Extraocular muscles intact. No scleral icterus.  MOUTH: Moist mucosal membrane. Dentition intact. No abscess noted.  EAR, NOSE, THROAT: Clear without exudates. No external lesions.  NECK: Supple. No thyromegaly. No nodules. No JVD.  PULMONARY:bilateral exp and insp multiphonic wheezing and rhonchi bilaterally CARDIOVASCULAR: S1 and S2. Regular rate and rhythm. No murmurs, rubs, or gallops. No edema. Pedal pulses 2+ bilaterally.  GASTROINTESTINAL: Soft, nontender, nondistended. No masses. Positive bowel sounds. No hepatosplenomegaly.  MUSCULOSKELETAL: No swelling, clubbing, or edema. Range of motion full in all extremities.  NEUROLOGIC: Cranial nerves II through XII are intact. No gross focal neurological deficits. Sensation intact. Reflexes intact.  SKIN: No ulceration, lesions, rashes, or cyanosis. Skin warm and dry. Turgor intact.  PSYCHIATRIC: Mood, affect within normal limits. The patient is awake, alert and oriented x 3. Insight, judgment intact.       IMAGING    MR BRAIN WO CONTRAST  Result Date: 01/22/2020 CLINICAL DATA:  Hand tremor 1 month.  Confusion. EXAM: MRI HEAD WITHOUT CONTRAST TECHNIQUE: Multiplanar, multiecho pulse sequences of the brain and surrounding structures were obtained without intravenous contrast. COMPARISON:  CT head 10/22/2019, 04/07/2019, 03/04/2015 as well as earlier CT studies. FINDINGS: Brain: Ventricle size normal. Cerebral volume normal. Negative for acute infarct. Normal white matter. No significant chronic ischemia. No hemorrhage. Cystic lesion is present in the  right anterior temporal lobe measuring approximately 8 x 11 mm. This is well-defined with surrounding mild hyperintensity. This appears unchanged from CT in 2017 and also present on the more recent CT studies. The lesion is not seen on a CT from 03/30/2013. Vascular: Normal arterial flow voids Skull and upper cervical spine: No focal skeletal lesion. Sinuses/Orbits: Mild to moderate mucosal edema paranasal sinuses. Bilateral cataract extraction Other: None IMPRESSION: Negative for acute or chronic infarction. 8 x 11 mm  cyst in the right anterior temporal lobe with surrounding edema. This is present on prior CT studies in 2017 but not 2015. This could represent a neoplasm. Follow-up MRI brain with contrast recommended for baseline evaluation. Electronically Signed   By: Marlan Palau M.D.   On: 01/22/2020 11:29   DG Chest Port 1 View  Result Date: 02/19/2020 CLINICAL DATA:  Hypertension Hyperlipidemia CAD COPD Abnormal chest x-ray Chronic cough EXAM: PORTABLE CHEST 1 VIEW COMPARISON:  02/18/2020 FINDINGS: The heart size and mediastinal contours are within normal limits. Both lungs are clear. The visualized skeletal structures are unremarkable. Neurostimulator leads again seen in the midthoracic spine. Vascular stent noted in the region of the left subclavian artery. ACDF changes partially visualized lower cervical spine. Curvilinear opacities again seen in the left lung base likely due to scarring. No focal airspace opacity in pneumonia. IMPRESSION: No focal airspace opacity to indicate pneumonia. Electronically Signed   By: Acquanetta Belling M.D.   On: 02/19/2020 12:51   DG Chest Port 1 View  Result Date: 02/18/2020 CLINICAL DATA:  Cough and shortness of breath EXAM: PORTABLE CHEST 1 VIEW COMPARISON:  February 17, 2020 FINDINGS: There are areas of atelectatic change in each lung base. There is no evident edema or airspace opacity. Heart size and pulmonary vascularity are normal. No adenopathy. There is aortic  atherosclerosis. Thoracic stimulator leads are present in the midthoracic region, stable. There is postoperative change in the lower cervical region. There is a stent in the left innominate vein region, stable. IMPRESSION: Bibasilar atelectasis. No edema or airspace opacity. Stable cardiac silhouette. Aortic Atherosclerosis (ICD10-I70.0). Electronically Signed   By: Bretta Bang III M.D.   On: 02/18/2020 15:22   DG Chest Portable 1 View  Result Date: 02/17/2020 CLINICAL DATA:  Shortness of breath EXAM: PORTABLE CHEST 1 VIEW COMPARISON:  12/13/2018 FINDINGS: Cardiac shadow is within normal limits. Aortic calcifications are seen. Left subclavian arterial stenting is noted. Spinal stimulator is seen. The lungs are well aerated bilaterally. Mild patchy bibasilar atelectatic changes are seen. No bony abnormality is seen. IMPRESSION: Mild basilar atelectasis Electronically Signed   By: Alcide Clever M.D.   On: 02/17/2020 11:57   CT RENAL STONE STUDY  Result Date: 02/17/2020 CLINICAL DATA:  Acute renal failure. EXAM: CT ABDOMEN AND PELVIS WITHOUT CONTRAST TECHNIQUE: Multidetector CT imaging of the abdomen and pelvis was performed following the standard protocol without IV contrast. COMPARISON:  CT abdomen pelvis June 24, 2012 and lumbar spine CT May 25, 2019. FINDINGS: Lower chest: Bibasilar consolidative opacities. Normal size heart. No pericardial effusion. Coronary artery calcifications. Hepatobiliary: No suspicious hepatic lesion. Cholecystectomy. No biliary ductal dilatation. Pancreas: Unremarkable Spleen: Unremarkable Adrenals/Urinary Tract: Bilateral adrenal glands are unremarkable. No hydronephrosis. No nephrolithiasis. Bladder is unremarkable. Stomach/Bowel: Stomach is distended without evidence of wall thickening. No suspicious small bowel wall thickening or dilation. Normal appendix. Redundant sigmoid colon. Moderate volume of formed stool throughout the colon. Colonic diverticulosis without findings  of acute diverticulosis. Vascular/Lymphatic: Aortic atherosclerosis. No enlarged abdominal or pelvic lymph nodes. Reproductive: Prostate is unremarkable. Other: No abdominopelvic ascites. Musculoskeletal: L4-L5 posterior lumbar fusion and disc spacer. No significant change in the L1 superior endplate compression deformity. IMPRESSION: 1. No hydronephrosis. 2. Bibasilar consolidative opacities, which could reflect atelectasis or pneumonia. 3. Colonic diverticulosis without findings of acute diverticulosis. 4. Moderate volume of formed stool throughout the colon. 5. Aortic atherosclerosis. Aortic Atherosclerosis (ICD10-I70.0). Electronically Signed   By: Maudry Mayhew MD   On: 02/17/2020 17:24   VAS US  CAROTID  Result Date: 02/03/2020 Carotid Arterial Duplex Study Indications:       Bilateral bruits and Lt Subc stent. Risk Factors:      Hypertension, hyperlipidemia, Diabetes, current smoker,                    coronary artery disease, PAD. Other Factors:     CHF. Comparison Study:  08/28/2014 showed normal ICA velocities, bilaterally. Performing Technologist: Joaquim Nam  Examination Guidelines: A complete evaluation includes B-mode imaging, spectral Doppler, color Doppler, and power Doppler as needed of all accessible portions of each vessel. Bilateral testing is considered an integral part of a complete examination. Limited examinations for reoccurring indications may be performed as noted.  Right Carotid Findings: +----------+--------+--------+--------+-------------------------------+--------+           PSV cm/sEDV cm/sStenosisPlaque Description             Comments +----------+--------+--------+--------+-------------------------------+--------+ CCA Prox  111     24                                                      +----------+--------+--------+--------+-------------------------------+--------+ CCA Mid   107     31                                                       +----------+--------+--------+--------+-------------------------------+--------+ CCA Distal100     25                                                      +----------+--------+--------+--------+-------------------------------+--------+ ICA Prox  125     37              diffuse, irregular and calcific         +----------+--------+--------+--------+-------------------------------+--------+ ICA Mid   170     55      40-59%  calcific and diffuse                    +----------+--------+--------+--------+-------------------------------+--------+ ICA Distal159     52                                                      +----------+--------+--------+--------+-------------------------------+--------+ ECA       159     20              focal and calcific                      +----------+--------+--------+--------+-------------------------------+--------+ +----------+--------+-------+----------------+-------------------+           PSV cm/sEDV cmsDescribe        Arm Pressure (mmHG) +----------+--------+-------+----------------+-------------------+ [id number]             Multiphasic, WNL99                  +----------+--------+-------+----------------+-------------------+ +---------+--------+--+--------+--+---------+ VertebralPSV cm/s68EDV cm/s27Antegrade +---------+--------+--+--------+--+---------+  Left Carotid Findings: +----------+--------+--------+--------+-------------------------------+--------+  PSV cm/sEDV cm/sStenosisPlaque Description             Comments +----------+--------+--------+--------+-------------------------------+--------+ CCA Prox  106     18      <50%    diffuse, irregular and calcific         +----------+--------+--------+--------+-------------------------------+--------+ CCA Mid   105     26                                                       +----------+--------+--------+--------+-------------------------------+--------+ CCA Distal108     33                                                      +----------+--------+--------+--------+-------------------------------+--------+ ICA Prox  166     58      40-59%  focal, smooth and calcific              +----------+--------+--------+--------+-------------------------------+--------+ ICA Mid   150     40                                                      +----------+--------+--------+--------+-------------------------------+--------+ ICA Distal139     44                                                      +----------+--------+--------+--------+-------------------------------+--------+ ECA       148     17                                                      +----------+--------+--------+--------+-------------------------------+--------+ +----------+--------+--------+----------------+-------------------+           PSV cm/sEDV cm/sDescribe        Arm Pressure (mmHG) +----------+--------+--------+----------------+-------------------+ Subclavian102             Multiphasic, NGE95                  +----------+--------+--------+----------------+-------------------+ +---------+--------+--+--------+-+---------+ VertebralPSV cm/s30EDV cm/s9Antegrade +---------+--------+--+--------+-+---------+  Summary: Right Carotid: Velocities in 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 Left Carotid: Velocities in the left ICA are consistent with a 40-59% stenosis. Vertebrals:  Bilateral vertebral arteries demonstrate antegrade flow. Subclavians: Normal flow hemodynamics were seen in bilateral subclavian              arteries. *See table(s) above for measurements and observations. Suggest follow up study in 12 months. Electronically signed by Charlton Haws MD on 02/03/2020 at 12:59:09 PM.    Final (Updated)       ASSESSMENT/PLAN   Acute on chronic  hypoxemic respiratory failure -Due to moderate acute exacerbation of COPD -Complicated by bibasilar atelectasis -Agree with Levoquin for AECOPD -  Solu-Medrol from 40 2 times daily>>>once daily  -PT OT when able -I-S at bedside encouraged to use multiple times each hour -Acapella device use as able several times daily -Roflumilast 250 MCG p.o. daily -respiratory cultures -currently on 4L/min [place]>>3L/min -midodrine 10 tid -lasix 40 daily IV->>diuresed overnight feels much imrpoved -increased dilaudid to 1mg  q3h with diphenhydramine - patient has advanced copd with chronic pain syndrome and elevated tolerance.   -d/c planning today - added Robinul 1mg  tid for heavy phlegm Thank you for allowing me to participate in the care of this patient.   Patient/Family are satisfied with care plan and all questions have been answered.   This document was prepared using Dragon voice recognition software and may include unintentional dictation errors.     , M.D.  Division of Pulmonary & Critical Care Medicine  [facility]

## 2020-02-20 NOTE — Progress Notes (Signed)
Patient oxygen levels are stable at 95% 5L [place]. Did notify Dr. Nelson Chimes but have had no new orders. He is not complaining of any issues at the moment. He has bed in reach and call bell in reach.

## 2020-02-20 NOTE — Progress Notes (Signed)
Patient resting in bed this morning. Did complain of pain. Medicated with scheduled and PRN medications. He did also voice some nausea, spoke with Dr. Nelson Chimes and she ordered PO zofran. He was having some concerns about his cough and some slight shortness of breath. She ordered for RT to come and perform chest PT for patient to help break up congestion within lung fields. During PT, his oxygen levels went down to 83% on 3L [place]. Went into room and noticed patient was slightly labored. Did increase to 6L [place] with humidification. Notified respiratory about increase in oxygen needs and breathing treatment. Will notify Dr. Nelson Chimes about issue. Bed in low position, call bell in reach. PT still in room with patient.

## 2020-02-20 NOTE — Care Management Important Message (Signed)
Important Message  Patient Details  Name: Gregory Hall MRN: [id number] Date of Birth: 1955/06/16   Medicare Important Message Given:  Yes     [name] 02/20/2020, 11:00 AM

## 2020-02-20 NOTE — TOC Progression Note (Signed)
Transition of Care [facility]) - Progression Note    Patient Details  Name: Gregory Hall MRN: [id number] Date of Birth: [birth year]  [facility] CM/SW Contact  Allayne Butcher, RN Phone Number: 02/20/2020, 8:19 AM  Clinical Narrative:    Barbara Cower with [facility] has accepted home health referral for RN, PT, and OT.  Expected Discharge Plan: Group Home Barriers to Discharge: Continued Medical Work up  Expected Discharge Plan and Services Expected Discharge Plan: Group Home In-house Referral: NA   Post Acute Care Choice: Home Health Living arrangements for the past 2 months: Group Home                 DME Arranged: Other see comment Theme park manager) DME Agency: [employer] Date DME Agency Contacted: 02/18/20 Time DME Agency Contacted: [phone] Representative spoke with at DME Agency: Elease Hashimoto HH Arranged: RN,PT,OT HH Agency: [facility] ([facility]) Date HH Agency Contacted: 02/20/20 Time HH Agency Contacted: [phone] Representative spoke with at [facility] Agency: Barbara Cower   Social Determinants of Health (SDOH) Interventions    Readmission Risk Interventions Readmission Risk Prevention Plan 02/18/2020 12/15/2019 09/24/2019  Transportation Screening Complete Complete Complete  PCP or Specialist Appt within 3-5 Days - - -  HRI or Home Care Consult - - -  Social Work Consult for Recovery Care Planning/Counseling - - -  Palliative Care Screening - - -  Medication Review Oceanographer) (No Data) Complete Complete  PCP or Specialist appointment within 3-5 days of discharge Complete - Complete  HRI or Home Care Consult Complete Complete Complete  SW Recovery Care/Counseling Consult Complete - -  Palliative Care Screening Not Applicable Not Applicable Complete  Skilled Nursing Facility Not Applicable Not Applicable Complete  Some recent data might be hidden

## 2020-02-20 NOTE — TOC Progression Note (Signed)
Transition of Care [facility]) - Progression Note    Patient Details  Name: Gregory Hall MRN: [id number] Date of Birth: 1955-12-07  Transition of Care [facility]) CM/SW Contact  Allayne Butcher, RN Phone Number: 02/20/2020, 1:36 PM  Clinical Narrative:    Patient is not medically cleared for discharge today but potential for discharge tomorrow.  Patient is from [facility].  Group home owner Jimmye Norman updated today that patient may be ready for DC.  Rowan Blase reports that on the weekend they use [employer] for pick up.  Patient is [facility] up [facility] with [facility]     Expected Discharge Plan: Group Home Barriers to Discharge: Continued Medical Work up  Expected Discharge Plan and Services Expected Discharge Plan: Group Home In-house Referral: NA   Post Acute Care Choice: Home Health Living arrangements for the past 2 months: Group Home                 DME Arranged: Other see comment Theme park manager) DME Agency: [facility] Date DME Agency Contacted: 02/18/20 Time DME Agency Contacted: [phone] Representative spoke with at DME Agency: Elease Hashimoto HH Arranged: RN,PT,OT HH Agency: [facility] Date HH Agency Contacted: 02/20/20 Time HH Agency Contacted: [phone] Representative spoke with at [facility] Agency: Barbara Cower   Social Determinants of Health (SDOH) Interventions    Readmission Risk Interventions Readmission Risk Prevention Plan 02/18/2020 12/15/2019 09/24/2019  Transportation Screening Complete Complete Complete  PCP or Specialist Appt within 3-5 Days - - -  HRI or Home Care Consult - - -  Social Work Consult for Recovery Care Planning/Counseling - - -  Palliative Care Screening - - -  Medication Review Oceanographer) (No Data) Complete Complete  PCP or Specialist appointment within 3-5 days of discharge Complete - Complete  HRI or Home Care Consult Complete Complete Complete  SW Recovery Care/Counseling Consult Complete - -  Palliative Care  Screening Not Applicable Not Applicable Complete  Skilled Nursing Facility Not Applicable Not Applicable Complete  Some recent data might be hidden

## 2020-02-20 NOTE — Progress Notes (Addendum)
PROGRESS NOTE    Gregory Hall  GYJ:[id number] DOB: Aug 14, 1955 DOA: 02/17/2020 PCP: Koren Bound, NP   Brief Narrative: Taken from H&P Gregory Hall is a 65 y.o. male with medical history significant for coronary artery disease status post PCI to the LAD, chronic diastolic dysfunction CHF, atrial flutter status post DC cardioversion on chronic anticoagulation with Xarelto, PAD status post left subclavian artery stenting, diabetes mellitus with complications of stage III chronic kidney disease, COPD with chronic respiratory failure on 3 L of oxygen, anxiety/depression, GERD who presents to the emergency room from his pulmonologist office for evaluation of a near syncopal episode.  The staff at [facility] clinic reported a blood pressure of 60/40.  Patient complained of feeling dizzy and lightheaded but denied having any falls or loss of consciousness. Labs within normal limit except AKI and x-ray with hyperinflated lungs and no new infiltrate.  Subjective: Patient continued to have tight chest and stating that he cannot breathe. He feels very uncomfortable that he cannot move any secretions.  According to him adding Robinul and Mucinex is not working yet. Also complaining of some nausea, no vomiting. Not comfortable leaving today with this type chest.  Assessment & Plan:   Principal Problem:   AKI (acute kidney injury) (HCC) Active Problems:   COPD with acute exacerbation (HCC)   CAD (coronary artery disease)   Atrial flutter (HCC)   Chronic respiratory failure with hypoxia (HCC)   Diabetes mellitus without complication (HCC)   Diastolic dysfunction with chronic heart failure (HCC)   Near syncope  AKI. Creatinine continue to improve.  Baseline less than 1.  Creatinine at 1.07 today.  Renal CT without hydronephrosis or nephrolithiasis. -Monitor renal function -Encourage p.o. hydration -Avoid nephrotoxins  Near syncope.  Most likely secondary to hypotension.  Blood  pressure responded to fluid resuscitation. -Continue with midodrine -Keep holding torsemide-can be restarted on discharge.  Chronic respiratory failure/COPD exacerbation.  Saturating in low 90s on his home oxygen requirement of 3 L.  Was having some expiratory wheeze. He was placed on cefepime for concern of pneumonia. Pulmonary was consulted-appreciate their recommendations. -Switch cefepime with p.o. Levaquin, pulmonology wants to complete a 5-day course because of his risk factors, chest x-ray without any sign of pneumonia and procalcitonin negative. -Robinul was added by pulmonary for his complaint of about phlegm causing irritation and tight chest. -Will add chest PT to see if that will help with his feeling of chest tightness and move mucus. -Continue with steroid -Continue with bronchodilators -Continue with supportive care -Continue with supplemental oxygen to keep the saturation above 90%  Chronic pain syndrome.  Patient with uncontrolled pain on his home regimen of oxycodone. -Continue Dilaudid with home regimen and monitor  History of atrial flutter. -Continue with amiodarone and Xarelto  BPH. -Continue with Flomax  History of depression and anxiety. -Continue with home dose of trazodone and duloxetine  Type II diabetes mellitus. -Continue to monitor -As needed SSI  Objective: Vitals:   02/20/20 0459 02/20/20 0720 02/20/20 1128 02/20/20 1548  BP: 132/67 (!) 131/56 122/61 (!) 128/53  Pulse: 67 72 69 69  Resp: 16 16 18 18   Temp: 98.2 F (36.8 C) 98.2 F (36.8 C) 98.8 F (37.1 C) (!) 97.5 F (36.4 C)  TempSrc: Oral     SpO2: 95% 92% 96% 95%  Weight:      Height:        Intake/Output Summary (Last 24 hours) at 02/20/2020 1709 Last data filed at 02/20/2020 1429 Gross  per 24 hour  Intake 240 ml  Output 1000 ml  Net -760 ml   Filed Weights   02/17/20 1122 02/18/20 0341 02/19/20 0607  Weight: 91.6 kg 96 kg 96 kg    Examination:  General.  Chronically  ill-appearing gentleman, in no acute distress. Pulmonary.  Scattered expiratory wheeze and prolonged expiratory component, normal respiratory effort. CV.  Regular rate and rhythm, no JVD, rub or murmur. Abdomen.  Soft, nontender, nondistended, BS positive. CNS.  Alert and oriented x3.  No focal neurologic deficit. Extremities.  No edema, no cyanosis, pulses intact and symmetrical. Psychiatry.  Judgment and insight appears normal.   DVT prophylaxis: Xarelto Code Status: Full Family Communication: Discussed with patient Disposition Plan:  Status is: Inpatient  Remains inpatient appropriate because:Inpatient level of care appropriate due to severity of illness   Dispo: The patient is from: Group home              Anticipated d/c is to: Group home              Anticipated d/c date is: 1 day              Patient currently is not medically stable to d/c.   Difficult to place patient No               Level of care: Med-Surg  Consultants:   Pulmonology  Procedures:  Antimicrobials:  Levaquin  Data Reviewed: I have personally reviewed following labs and imaging studies  CBC: Recent Labs  Lab 02/17/20 1149 02/18/20 0550  WBC 9.8 13.0*  NEUTROABS 7.7  --   HGB 10.5* 9.8*  HCT 35.6* 32.9*  MCV 78.6* 77.8*  PLT 354 333   Basic Metabolic Panel: Recent Labs  Lab 02/17/20 1149 02/18/20 0550 02/19/20 0644 02/20/20 0635  NA 137 137 136 136  K 3.3* 4.5 5.1 5.0  CL 89* 91* 90* 89*  CO2 35* 32 35* 36*  GLUCOSE 196* 162* 267* 169*  BUN 24* 25* 30* 26*  CREATININE 3.20* 2.03* 1.31* 1.07  CALCIUM 8.7* 8.5* 9.0 9.3  MG  --   --  2.2  --    GFR: Estimated Creatinine Clearance: 77.9 mL/min (by C-G formula based on SCr of 1.07 mg/dL). Liver Function Tests: Recent Labs  Lab 02/17/20 1149  AST 13*  ALT 8  ALKPHOS 86  BILITOT 0.5  PROT 6.8  ALBUMIN 3.1*   No results for input(s): LIPASE, AMYLASE in the last 168 hours. No results for input(s): AMMONIA in the last 168  hours. Coagulation Profile: No results for input(s): INR, PROTIME in the last 168 hours. Cardiac Enzymes: No results for input(s): CKTOTAL, CKMB, CKMBINDEX, TROPONINI in the last 168 hours. BNP (last 3 results) No results for input(s): PROBNP in the last 8760 hours. HbA1C: Recent Labs    02/18/20 0550  HGBA1C 7.9*   CBG: Recent Labs  Lab 02/19/20 1631 02/19/20 2039 02/20/20 0752 02/20/20 1148 02/20/20 1602  GLUCAP 227* 185* 202* 186* 183*   Lipid Profile: No results for input(s): CHOL, HDL, LDLCALC, TRIG, CHOLHDL, LDLDIRECT in the last 72 hours. Thyroid Function Tests: No results for input(s): TSH, T4TOTAL, FREET4, T3FREE, THYROIDAB in the last 72 hours. Anemia Panel: No results for input(s): VITAMINB12, FOLATE, FERRITIN, TIBC, IRON, RETICCTPCT in the last 72 hours. Sepsis Labs: Recent Labs  Lab 02/17/20 1149 02/17/20 1400 02/18/20 0550  PROCALCITON  --   --  <0.10  LATICACIDVEN 1.8 1.3  --     Recent  Results (from the past 240 hour(s))  Culture, blood (routine x 2)     Status: None (Preliminary result)   Collection Time: 02/17/20 11:48 AM   Specimen: BLOOD  Result Value Ref Range Status   Specimen Description BLOOD RIGHT FA  Final   Special Requests   Final    BOTTLES DRAWN AEROBIC AND ANAEROBIC Blood Culture adequate volume   Culture   Final    NO GROWTH 3 DAYS Performed at [facility], [address], [place], [place] [address]    Report Status PENDING  Incomplete  Culture, blood (routine x 2)     Status: None (Preliminary result)   Collection Time: 02/17/20 12:10 PM   Specimen: BLOOD  Result Value Ref Range Status   Specimen Description BLOOD LEFT AC  Final   Special Requests   Final    BOTTLES DRAWN AEROBIC AND ANAEROBIC Blood Culture adequate volume   Culture   Final    NO GROWTH 3 DAYS Performed at [facility], [address] [place], [place] [address]    Report Status PENDING  Incomplete  Resp Panel by RT-PCR (Flu A&B,  Covid) Nasopharyngeal Swab     Status: None   Collection Time: 02/17/20 12:39 PM   Specimen: Nasopharyngeal Swab; Nasopharyngeal(NP) swabs in vial transport medium  Result Value Ref Range Status   SARS Coronavirus 2 by RT PCR NEGATIVE NEGATIVE Final    Comment: (NOTE) SARS-CoV-2 target nucleic acids are NOT DETECTED.  The SARS-CoV-2 RNA is generally detectable in upper respiratory specimens during the acute phase of infection. The lowest concentration of SARS-CoV-2 viral copies this assay can detect is 138 copies/mL. A negative result does not preclude SARS-Cov-2 infection and should not be used as the sole basis for treatment or other patient management decisions. A negative result may occur with  improper specimen collection/handling, submission of specimen other than nasopharyngeal swab, presence of viral mutation(s) within the areas targeted by this assay, and inadequate number of viral copies(<138 copies/mL). A negative result must be combined with clinical observations, patient history, and epidemiological information. The expected result is Negative.  Fact Sheet for Patients:  BloggerCourse.com  Fact Sheet for Healthcare Providers:  SeriousBroker.it  This test is no t yet approved or cleared by the [place] FDA and  has been authorized for detection and/or diagnosis of SARS-CoV-2 by FDA under an Emergency Use Authorization (EUA). This EUA will remain  in effect (meaning this test can be used) for the duration of the COVID-19 declaration under Section 564(b)(1) of the Act, 21 U.S.C.section 360bbb-3(b)(1), unless the authorization is terminated  or revoked sooner.       Influenza A by PCR NEGATIVE NEGATIVE Final   Influenza B by PCR NEGATIVE NEGATIVE Final    Comment: (NOTE) The Xpert Xpress SARS-CoV-2/FLU/RSV plus assay is intended as an aid in the diagnosis of influenza from Nasopharyngeal swab specimens and should  not be used as a sole basis for treatment. Nasal washings and aspirates are unacceptable for Xpert Xpress SARS-CoV-2/FLU/RSV testing.  Fact Sheet for Patients: BloggerCourse.com  Fact Sheet for Healthcare Providers: SeriousBroker.it  This test is not yet approved or cleared by the [place] FDA and has been authorized for detection and/or diagnosis of SARS-CoV-2 by FDA under an Emergency Use Authorization (EUA). This EUA will remain in effect (meaning this test can be used) for the duration of the COVID-19 declaration under Section 564(b)(1) of the Act, 21 U.S.C. section 360bbb-3(b)(1), unless the authorization is terminated or  revoked.  Performed at [facility], [address] [place], [place] [address]   Expectorated sputum assessment w rflx to resp cult     Status: None   Collection Time: 02/18/20  8:15 PM   Specimen: Sputum  Result Value Ref Range Status   Specimen Description SPUTUM  Final   Special Requests NONE  Final   Sputum evaluation   Final    THIS SPECIMEN IS ACCEPTABLE FOR SPUTUM CULTURE Performed at [facility], [address] [place], [place] [address]    Report Status 02/18/2020 FINAL  Final  Culture, respiratory     Status: None (Preliminary result)   Collection Time: 02/18/20  8:15 PM   Specimen: SPU  Result Value Ref Range Status   Specimen Description   Final    SPUTUM Performed at [facility], [address], [place], [place] [address]    Special Requests   Final    NONE Reflexed from [phone] Performed at [facility], [address] [place], [place] [address]    Gram Stain   Final    FEW WBC PRESENT, PREDOMINANTLY PMN NO ORGANISMS SEEN    Culture   Final    CULTURE REINCUBATED FOR BETTER GROWTH Performed at [facility], [address], [place], [place] [address]    Report Status PENDING  Incomplete  Aspergillus Ag, BAL/Serum     Status:  None   Collection Time: 02/19/20  6:44 AM   Specimen: Sputum; Respiratory  Result Value Ref Range Status   Aspergillus Ag, BAL/Serum 0.04 0.00 - 0.49 Index Final    Comment: (NOTE) Performed At: [facility] [address] [place], [place] [id number] Jolene Schimke MD DU:[id number]      Radiology Studies: DG Chest Port 1 View  Result Date: 02/19/2020 CLINICAL DATA:  Hypertension Hyperlipidemia CAD COPD Abnormal chest x-ray Chronic cough EXAM: PORTABLE CHEST 1 VIEW COMPARISON:  02/18/2020 FINDINGS: The heart size and mediastinal contours are within normal limits. Both lungs are clear. The visualized skeletal structures are unremarkable. Neurostimulator leads again seen in the midthoracic spine. Vascular stent noted in the region of the left subclavian artery. ACDF changes partially visualized lower cervical spine. Curvilinear opacities again seen in the left lung base likely due to scarring. No focal airspace opacity in pneumonia. IMPRESSION: No focal airspace opacity to indicate pneumonia. Electronically Signed   By: Acquanetta Belling M.D.   On: 02/19/2020 12:51    Scheduled Meds: . amiodarone  200 mg Oral Daily  . atorvastatin  40 mg Oral QHS  . budesonide  0.5 mg Nebulization BID  . cholecalciferol  2,000 Units Oral Daily  . diphenhydrAMINE  25 mg Oral TID  . DULoxetine  60 mg Oral Daily  . fluticasone  1 spray Each Nare BID  . furosemide  40 mg Intravenous Daily  . glycopyrrolate  1 mg Oral TID  . guaiFENesin  600 mg Oral BID  . insulin aspart  0-15 Units Subcutaneous TID WC  . lamoTRIgine  100 mg Oral Daily  . levofloxacin  500 mg Oral Daily  . loratadine  10 mg Oral Daily  . magnesium oxide  400 mg Oral Daily  . methylPREDNISolone (SOLU-MEDROL) injection  40 mg Intravenous Q24H  . midodrine  10 mg Oral TID  . oxyCODONE  10 mg Oral Q6H  . pantoprazole  40 mg Oral Daily  . pregabalin  200 mg Oral Q8H  . Ensure Max Protein  11 oz Oral BID BM  .  rivaroxaban  20 mg Oral Q  supper  . roflumilast  250 mcg Oral Daily  . senna-docusate  1 tablet Oral Daily  . tamsulosin  0.4 mg Oral QPC supper  . traZODone  150 mg Oral QHS  . Ubrogepant  100 mg Oral Daily   Continuous Infusions:   LOS: 3 days   Time spent: 25 minutes  Arnetha Courser, MD [facility]  If 7PM-7AM, please contact night-coverage [email or web address]  02/20/2020, 5:09 PM   This record has been created using Conservation officer, historic buildings. Errors have been sought and corrected,but may not always be located. Such creation errors do not reflect on the standard of care.

## 2020-02-21 DIAGNOSIS — N179 Acute kidney failure, unspecified: Secondary | ICD-10-CM | POA: Diagnosis not present

## 2020-02-21 LAB — CULTURE, RESPIRATORY W GRAM STAIN: Culture: NORMAL

## 2020-02-21 LAB — GLUCOSE, CAPILLARY
Glucose-Capillary: 212 mg/dL — ABNORMAL HIGH (ref 70–99)
Glucose-Capillary: 175 mg/dL — ABNORMAL HIGH (ref 70–99)
Glucose-Capillary: 190 mg/dL — ABNORMAL HIGH (ref 70–99)

## 2020-02-21 LAB — ASPERGILLUS ANTIGEN, BAL/SERUM: Aspergillus Ag, BAL/Serum: 0.04 Index (ref 0.00–0.49)

## 2020-02-21 MED ORDER — ONDANSETRON 4 MG PO TBDP
4.0000 mg | ORAL_TABLET | Freq: Three times a day (TID) | ORAL | 0 refills | Status: AC | PRN
Start: 2020-02-21 — End: ?

## 2020-02-21 MED ORDER — ROFLUMILAST 500 MCG PO TABS: 250.0000 ug | ORAL_TABLET | Freq: Every day | ORAL | 0 refills | Status: DC

## 2020-02-21 MED ORDER — UBRELVY 100 MG PO TABS: 100.0000 mg | ORAL_TABLET | Freq: Every day | ORAL | 0 refills | Status: DC

## 2020-02-21 MED ORDER — GLYCOPYRROLATE 1 MG PO TABS: 1.0000 mg | ORAL_TABLET | Freq: Three times a day (TID) | ORAL | 0 refills | Status: DC

## 2020-02-21 MED ORDER — PREDNISONE 50 MG PO TABS
ORAL_TABLET | ORAL | 0 refills | Status: DC
Start: 2020-02-21 — End: 2020-03-20

## 2020-02-21 MED ORDER — GUAIFENESIN ER 600 MG PO TB12: 600.0000 mg | ORAL_TABLET | Freq: Two times a day (BID) | ORAL | 0 refills | Status: AC

## 2020-02-21 MED ORDER — LEVOFLOXACIN 500 MG PO TABS: 500.0000 mg | ORAL_TABLET | Freq: Every day | ORAL | 0 refills | Status: AC

## 2020-02-21 MED ORDER — HYDROMORPHONE HCL 1 MG/ML IJ SOLN
1.0000 mg | Freq: Once | INTRAMUSCULAR | Status: AC
Start: 2020-02-21 — End: 2020-02-21
  Administered 2020-02-21: 20:00:00 1 mg via INTRAVENOUS
  Filled 2020-02-21: qty 1

## 2020-02-21 NOTE — Progress Notes (Signed)
Patient resting in bed. Went into room due to patient calling out about discharge today. He was having a panic attack and breathing heavily stating that he was unfit to be discharged and sent home. He stated that he felt like his oxygen and lung capacity weren't good enough and that his pain is not completely better. Notified Dr. Nelson Chimes. She came to floor and saw patient. She came out to desk and stated that patient was cleared medically to go home, but if he refuses to leave we can't just kick him out. I then gave the patient a few minutes and went into room with tech to get patient ready. When telling patient that he was back to baseline on his oxygen needs he stated that he still was not comfortable going home. He asked for his paperwork to appeal discharge. Gave him the paper and he made a call. Notified Dr. Nelson Chimes and Dr. Mervyn Skeeters regarding issue. I then stated to patient that his PRN pain medication had been discontinued and that he would no longer be able to receive that medication. He then became upset and stated to get in touch with the pulmonologist because he ordered the medicine and not Dr. Nelson Chimes. Notified both providers and case worker of situation. Bed in low position and call bell in reach

## 2020-02-21 NOTE — TOC Transition Note (Signed)
Transition of Care [facility]) - CM/SW Discharge Note   Patient Details  Name: Gregory Hall MRN: [id number] Date of Birth: [birth year]  Transition of Care [facility]) CM/SW Contact:  Bing Quarry, RN Phone Number: 02/21/2020, 3:47 PM   Clinical Narrative:    02/21/20 1547 Patient now being discharged today after discussing situation with pulmonologist. Transport arranged via [facility] for transport oxygen needs of 3L/[place].  [facility] at group home setting already in place but could not reach for transport oxygen. DME Wheelchair will be delivered to group home 02/22/20 per [facility]. DC summary faxed to [facility] personal fax number and notified her of renewed discharge. [facility] notified as well via [facility]. Facesheet and Med. Nec. Form printed to unit and acknowledged by Unit RN Trish Mage. Left VM with sister's contact information of transfer today and CM call back number if any questions.     Final next level of care: Group Home Barriers to Discharge: Barriers Resolved   Patient Goals and CMS Choice Patient states their goals for this hospitalization and ongoing recovery are:: to get better CMS Medicare.gov Compare Post Acute Care list provided to:: Patient Choice offered to / list presented to : Patient  Discharge Placement                Patient to be transferred to facility by: [facility] Name of family member notified: Rennis Petty via VM on mobile number (Group home notified by CM) Patient and family notified of of transfer: 02/21/20  Discharge Plan and Services In-house Referral: NA   Post Acute Care Choice: Home Health          DME Arranged: Wheelchair manual DME Agency: [facility] Date DME Agency Contacted:  (Updated today to replacement WC needed.) Time DME Agency Contacted: 1540 Representative spoke with at DME Agency: Arnold Long via [facility] on call. Will deliver to group home. HH Arranged: RN,OT,PT HH Agency: [facility] Date HH Agency Contacted:  02/21/20 Time HH Agency Contacted: [phone] Representative spoke with at [facility] Agency: [facility] (To confirm discharge.)  Social Determinants of Health (SDOH) Interventions     Readmission Risk Interventions Readmission Risk Prevention Plan 02/18/2020 12/15/2019 09/24/2019  Transportation Screening Complete Complete Complete  PCP or Specialist Appt within 3-5 Days - - -  HRI or Home Care Consult - - -  Social Work Consult for Recovery Care Planning/Counseling - - -  Palliative Care Screening - - -  Medication Review Oceanographer) (No Data) Complete Complete  PCP or Specialist appointment within 3-5 days of discharge Complete - Complete  HRI or Home Care Consult Complete Complete Complete  SW Recovery Care/Counseling Consult Complete - -  Palliative Care Screening Not Applicable Not Applicable Complete  Skilled Nursing Facility Not Applicable Not Applicable Complete  Some recent data might be hidden

## 2020-02-21 NOTE — TOC Progression Note (Addendum)
Transition of Care [facility]) - Progression Note    Patient Details  Name: Gregory Hall MRN: [id number] Date of Birth: 1955/06/17  Transition of Care [facility]) CM/SW Contact  Bing Quarry, RN Phone Number: 02/21/2020, 11:41 AM  Clinical Narrative:    02/21/20 Update at 1254 pm. FYI: Gearldine Bienenstock RN called back to check if patient had a repeat MRI with contrast for MRI results done 01/22/20. Patient has a pain stimulator according to [facility] that has to be turned off using a remote she can bring from the group home if he happens to get it while here this admission. Gabriel Cirri RN CM  According to Unit RN patient is refusing discharge due to high pain levels. Discharged canceled for today per provider.   Pain Management:  Gearldine Bienenstock RN said he was on a pain contract with pain management clinic. Received Scheduled pain meds noted on admission med rec and prn Baclofen 20 mg per Gearldine Bienenstock RN at group home. This was started at pain clinic and she reported he did well with this at facility. Reported to provider.  Gabriel Cirri RN CM  02/21/20 Discharged planned for today per provider.   Group home director called and left VM at [phone].   Brandy RN called CM back and acknowledged ready for patient.   Need DC summary faxed to [phone] before 3 pm if medications changed or new.   DME: Needs replacement WC. [employer] has not completed this as no order in.   [facility] accepted but needs orders placed as well. Provider notified.   Asked PT for [facility] recommendations as patient is 6'2" tall and 96 kg.   [place] to unit filled out except for time.   Contacted [employer] and confirmed drivers available today.   Group home is [facility] at [address], [place], [place] [address].   Communicated with unit RN, PT, and provider.   Pt is now refusing to leave due to pain per RN.   Will monitor.   Noni Saupe RN CM        Expected Discharge Plan: Group Home  Barriers  to Discharge: Barriers Resolved    Expected Discharge Plan and Services  Expected Discharge Plan: Group Home  In-house Referral: NA     Post Acute Care Choice: Home Health  Living arrangements for the past 2 months: Group Home  Expected Discharge Date: 02/21/20                DME Arranged: Wheelchair manual  DME Agency: [employer]  Date DME Agency Contacted:  (Updated today to replacement WC needed.)  Time DME Agency Contacted: 1518  Representative spoke with at DME Agency: Elease Hashimoto  [facility] Arranged:  ([facility] notified today of DC, Orders need to be placed.)  [facility] Agency: [facility]  Date [facility] Agency Contacted: 02/20/20  Time HH Agency Contacted: [phone]  Representative spoke with at [facility] Agency:  (Confirmed today with Conception Doebler Cower 02/21/20)   Social Determinants of Health (SDOH) Interventions    Readmission Risk Interventions Readmission Risk Prevention Plan 02/18/2020 12/15/2019 09/24/2019  Transportation Screening Complete Complete Complete  PCP or Specialist Appt within 3-5 Days - - -  HRI or Home Care Consult - - -  Social Work Consult for Recovery Care Planning/Counseling - - -  Palliative Care Screening - - -  Medication Review (RN Care Manager) (No Data) Complete Complete  PCP or Specialist appointment within 3-5 days of discharge Complete -  Complete  HRI or Home Care Consult Complete Complete Complete  SW Recovery Care/Counseling Consult Complete - -  Palliative Care Screening Not Applicable Not Applicable Complete  Skilled Nursing Facility Not Applicable Not Applicable Complete  Some recent data might be hidden

## 2020-02-21 NOTE — Discharge Instructions (Signed)

## 2020-02-21 NOTE — Progress Notes (Signed)
Pulmonary Medicine          Date: 02/21/2020,   MRN# [id number] [facility] 1955-08-10     AdmissionWeight: 91.6 kg                 CurrentWeight: 96 kg  Referring physician: Dr Nelson Chimes    CHIEF COMPLAINT:   Acute exacerbation of COPD   HISTORY OF PRESENT ILLNESS   This is 65 year old pleasant male with a history of CAD status post stenting, chronic pain syndrome, cervicalgia, GERD, essential hypertension, neuropathy, dysphagia history of cholecystectomy who came in for worsening respiratory symptoms including wheezing refractory to inhaler therapy at home.  Additionally patient reports chest discomfort and cough however denies fever chills diaphoresis nausea or vomiting.  He was found to be hypoxemic in the 80s which improved with supplemental oxygen.  I saw patient on day of admission in clinic he was hypotensive and hypoxemic which promted ER evaluation.  He is admitted for acute exacerbation of COPD.    02/19/2020- patient is improved, he is diuresed well overnight, he was weaned to 3L/min.  He complains of heavy phlegm production still.  He is getting close to baseline now.  Today we will add rubinol for heavy mucoid phlegm in context of advanced COPD. CXR is improved today.   02/20/2020- patient is improved he is down to 3L, his lung auscultation is improved.  He has recurrent acute exacerbation numerous times.  I had discussed palliative and hospice care with patient but he wishes to be full code and continue full scope therapy.  He is on whelchair at baseline very weak and deconditioned. I discussed he can d/c home today from pulmonary perspective , patient agreed but he expressed concnern due to repeated and recurrent exacerbations.   02/21/2020- patient is cleared for d/c home from pulmonary perspective.  He wanted to stay longer because he is cared for so well but at this point he can be discharged home with outpatient follow up with [facility] clinic  pulmonary.  PAST MEDICAL HISTORY   Past Medical History:  Diagnosis Date  . AKI (acute kidney injury) (HCC) 09/12/2019  . CAD (coronary artery disease)    s/p PTCA and stent x2  . Chest pain   . CHF (congestive heart failure) (HCC)   . Chronic pain syndrome   . COPD (chronic obstructive pulmonary disease) (HCC)   . Degenerative cervical disc   . Depression   . Diabetes mellitus without complication (HCC)   . Dyslipidemia   . GERD (gastroesophageal reflux disease)   . Hernia 2014  . Hypertension   . MRSA (methicillin resistant staph aureus) culture positive 2011  . Neuropathy   . Nutcracker esophagus   . Rectus diastasis 07/19/2012     SURGICAL HISTORY   Past Surgical History:  Procedure Laterality Date  . BACK SURGERY  2012  . CARDIOVERSION N/A 09/26/2019   Procedure: CARDIOVERSION;  Surgeon: Antonieta Iba, MD;  Location: [facility] ORS;  Service: Cardiovascular;  Laterality: N/A;  . CHOLECYSTECTOMY    . COLONOSCOPY  Jan 2014   [name]  . CORONARY ANGIOPLASTY WITH STENT PLACEMENT  2009   stents x2, in [place], [place]  . FOOT SURGERY     Right  . NECK SURGERY    . SPINE SURGERY  2012,2013  . TONSILLECTOMY       FAMILY HISTORY   Family History  Family history unknown: Yes     SOCIAL HISTORY   Social History  Tobacco Use  . Smoking status: Former Smoker    Packs/day: 0.50    Years: 30.00    Pack years: 15.00    Types: Cigarettes    Quit date: 11/26/2019    Years since quitting: 0.2  . Smokeless tobacco: Never Used  Substance Use Topics  . Alcohol use: No  . Drug use: No     MEDICATIONS    Home Medication:    Current Medication:  Current Facility-Administered Medications:  .  amiodarone (PACERONE) tablet 200 mg, 200 mg, Oral, Daily, Agbata, Tochukwu, MD, 200 mg at 02/21/20 0833 .  atorvastatin (LIPITOR) tablet 40 mg, 40 mg, Oral, QHS, Agbata, Tochukwu, MD, 40 mg at 02/20/20 2104 .  budesonide (PULMICORT) nebulizer solution 0.5 mg, 0.5 mg,  Nebulization, BID, Agbata, Tochukwu, MD, 0.5 mg at 02/21/20 0755 .  calcium carbonate (TUMS - dosed in mg elemental calcium) chewable tablet 200 mg of elemental calcium, 1 tablet, Oral, BID PRN, Arnetha Courser, MD .  cholecalciferol (VITAMIN D) tablet 2,000 Units, 2,000 Units, Oral, Daily, Agbata, Tochukwu, MD, 2,000 Units at 02/21/20 0833 .  diphenhydrAMINE (BENADRYL) capsule 25 mg, 25 mg, Oral, TID, Arnetha Courser, MD, 25 mg at 02/21/20 0832 .  DULoxetine (CYMBALTA) DR capsule 60 mg, 60 mg, Oral, Daily, Agbata, Tochukwu, MD, 60 mg at 02/21/20 0832 .  fluticasone (FLONASE) 50 MCG/ACT nasal spray 1 spray, 1 spray, Each Nare, BID, Agbata, Tochukwu, MD, 1 spray at 02/21/20 0837 .  furosemide (LASIX) injection 40 mg, 40 mg, Intravenous, Daily, Vida Rigger, MD, 40 mg at 02/19/20 0948 .  glycopyrrolate (ROBINUL) tablet 1 mg, 1 mg, Oral, TID, Vida Rigger, MD, 1 mg at 02/21/20 0832 .  guaiFENesin (MUCINEX) 12 hr tablet 600 mg, 600 mg, Oral, BID, Arnetha Courser, MD, 600 mg at 02/21/20 0832 .  insulin aspart (novoLOG) injection 0-15 Units, 0-15 Units, Subcutaneous, TID WC, Agbata, Tochukwu, MD, 5 Units at 02/21/20 0833 .  ipratropium-albuterol (DUONEB) 0.5-2.5 (3) MG/3ML nebulizer solution 3 mL, 3 mL, Nebulization, Q6H PRN, Agbata, Tochukwu, MD, 3 mL at 02/20/20 0058 .  lamoTRIgine (LAMICTAL) tablet 100 mg, 100 mg, Oral, Daily, Agbata, Tochukwu, MD, 100 mg at 02/21/20 0832 .  levofloxacin (LEVAQUIN) tablet 500 mg, 500 mg, Oral, Daily, Ronnald Ramp, RPH, 500 mg at 02/21/20 0831 .  loratadine (CLARITIN) tablet 10 mg, 10 mg, Oral, Daily, Agbata, Tochukwu, MD, 10 mg at 02/21/20 0832 .  magnesium oxide (MAG-OX) tablet 400 mg, 400 mg, Oral, Daily, Agbata, Tochukwu, MD, 400 mg at 02/21/20 0834 .  methylPREDNISolone sodium succinate (SOLU-MEDROL) 40 mg/mL injection 40 mg, 40 mg, Intravenous, Q24H, Karna Christmas, Arad Burston, MD, 40 mg at 02/21/20 0433 .  midodrine (PROAMATINE) tablet 10 mg, 10 mg, Oral, TID, Karna Christmas,  Breylen Agyeman, MD, 10 mg at 02/21/20 0832 .  nitroGLYCERIN (NITROSTAT) SL tablet 0.4 mg, 0.4 mg, Sublingual, Q5 min PRN, Agbata, Tochukwu, MD .  ondansetron (ZOFRAN-ODT) disintegrating tablet 4 mg, 4 mg, Oral, Q8H PRN, Arnetha Courser, MD, 4 mg at 02/21/20 0205 .  oxyCODONE (Oxy IR/ROXICODONE) immediate release tablet 10 mg, 10 mg, Oral, Q6H, Agbata, Tochukwu, MD, 10 mg at 02/21/20 0833 .  oxymetazoline (AFRIN) 0.05 % nasal spray 2 spray, 2 spray, Each Nare, BID PRN, Agbata, Tochukwu, MD .  pantoprazole (PROTONIX) EC tablet 40 mg, 40 mg, Oral, Daily, Agbata, Tochukwu, MD, 40 mg at 02/21/20 0833 .  pregabalin (LYRICA) capsule 200 mg, 200 mg, Oral, Q8H, Agbata, Tochukwu, MD, 200 mg at 02/21/20 0509 .  protein supplement (ENSURE MAX) liquid, 11  oz, Oral, BID BM, Agbata, Tochukwu, MD .  rivaroxaban (XARELTO) tablet 20 mg, 20 mg, Oral, Q supper, Agbata, Tochukwu, MD, 20 mg at 02/20/20 1706 .  roflumilast (DALIRESP) tablet 250 mcg, 250 mcg, Oral, Daily, Agbata, Tochukwu, MD, 250 mcg at 02/21/20 0832 .  senna-docusate (Senokot-S) tablet 1 tablet, 1 tablet, Oral, Daily, Agbata, Tochukwu, MD, 1 tablet at 02/18/20 0850 .  sodium chloride (OCEAN) 0.65 % nasal spray 1 spray, 1 spray, Each Nare, PRN, Agbata, Tochukwu, MD .  tamsulosin (FLOMAX) capsule 0.4 mg, 0.4 mg, Oral, QPC supper, Agbata, Tochukwu, MD, 0.4 mg at 02/20/20 1706 .  traZODone (DESYREL) tablet 150 mg, 150 mg, Oral, QHS, Agbata, Tochukwu, MD .  Ubrogepant TABS 100 mg, 100 mg, Oral, Daily, Agbata, Tochukwu, MD    ALLERGIES   Acetaminophen, Fentanyl, and Nsaids     REVIEW OF SYSTEMS    Review of Systems:  Gen:  Denies  fever, sweats, chills weigh loss  HEENT: Denies blurred vision, double vision, ear pain, eye pain, hearing loss, nose bleeds, sore throat Cardiac:  No dizziness, chest pain or heaviness, chest tightness,edema Resp: Reports shortness of breath,wheezing, denies hemoptysis,  Gi: Denies swallowing difficulty, stomach pain, nausea or  vomiting, diarrhea, constipation, bowel incontinence Gu:  Denies bladder incontinence, burning urine Ext:   Denies Joint pain, stiffness or swelling Skin: Denies  skin rash, easy bruising or bleeding or hives Endoc:  Denies polyuria, polydipsia , polyphagia or weight change Psych:   Denies depression, insomnia or hallucinations   Other:  All other systems negative   VS: BP 114/67 (BP Location: Right Arm)   Pulse 78   Temp 98.7 F (37.1 C) (Oral)   Resp 16   Ht  (1.88 m)   Wt 96 kg   SpO2 95%   BMI 27.17 kg/m      PHYSICAL EXAM    GENERAL:NAD, no fevers, chills, no weakness no fatigue HEAD: Normocephalic, atraumatic.  EYES: Pupils equal, round, reactive to light. Extraocular muscles intact. No scleral icterus.  MOUTH: Moist mucosal membrane. Dentition intact. No abscess noted.  EAR, NOSE, THROAT: Clear without exudates. No external lesions.  NECK: Supple. No thyromegaly. No nodules. No JVD.  PULMONARY:bilateral exp and insp multiphonic wheezing and rhonchi bilaterally CARDIOVASCULAR: S1 and S2. Regular rate and rhythm. No murmurs, rubs, or gallops. No edema. Pedal pulses 2+ bilaterally.  GASTROINTESTINAL: Soft, nontender, nondistended. No masses. Positive bowel sounds. No hepatosplenomegaly.  MUSCULOSKELETAL: No swelling, clubbing, or edema. Range of motion full in all extremities.  NEUROLOGIC: Cranial nerves II through XII are intact. No gross focal neurological deficits. Sensation intact. Reflexes intact.  SKIN: No ulceration, lesions, rashes, or cyanosis. Skin warm and dry. Turgor intact.  PSYCHIATRIC: Mood, affect within normal limits. The patient is awake, alert and oriented x 3. Insight, judgment intact.       IMAGING    DG Chest Port 1 View  Result Date: 02/19/2020 CLINICAL DATA:  Hypertension Hyperlipidemia CAD COPD Abnormal chest x-ray Chronic cough EXAM: PORTABLE CHEST 1 VIEW COMPARISON:  02/18/2020 FINDINGS: The heart size and mediastinal contours are  within normal limits. Both lungs are clear. The visualized skeletal structures are unremarkable. Neurostimulator leads again seen in the midthoracic spine. Vascular stent noted in the region of the left subclavian artery. ACDF changes partially visualized lower cervical spine. Curvilinear opacities again seen in the left lung base likely due to scarring. No focal airspace opacity in pneumonia. IMPRESSION: No focal airspace opacity to indicate pneumonia. Electronically Signed  By: Mauri Reading  Mir M.D.   On: 02/19/2020 12:51   DG Chest Port 1 View  Result Date: 02/18/2020 CLINICAL DATA:  Cough and shortness of breath EXAM: PORTABLE CHEST 1 VIEW COMPARISON:  February 17, 2020 FINDINGS: There are areas of atelectatic change in each lung base. There is no evident edema or airspace opacity. Heart size and pulmonary vascularity are normal. No adenopathy. There is aortic atherosclerosis. Thoracic stimulator leads are present in the midthoracic region, stable. There is postoperative change in the lower cervical region. There is a stent in the left innominate vein region, stable. IMPRESSION: Bibasilar atelectasis. No edema or airspace opacity. Stable cardiac silhouette. Aortic Atherosclerosis (ICD10-I70.0). Electronically Signed   By: Bretta Bang III M.D.   On: 02/18/2020 15:22   DG Chest Portable 1 View  Result Date: 02/17/2020 CLINICAL DATA:  Shortness of breath EXAM: PORTABLE CHEST 1 VIEW COMPARISON:  12/13/2018 FINDINGS: Cardiac shadow is within normal limits. Aortic calcifications are seen. Left subclavian arterial stenting is noted. Spinal stimulator is seen. The lungs are well aerated bilaterally. Mild patchy bibasilar atelectatic changes are seen. No bony abnormality is seen. IMPRESSION: Mild basilar atelectasis Electronically Signed   By: Alcide Clever M.D.   On: 02/17/2020 11:57   CT RENAL STONE STUDY  Result Date: 02/17/2020 CLINICAL DATA:  Acute renal failure. EXAM: CT ABDOMEN AND PELVIS WITHOUT  CONTRAST TECHNIQUE: Multidetector CT imaging of the abdomen and pelvis was performed following the standard protocol without IV contrast. COMPARISON:  CT abdomen pelvis June 24, 2012 and lumbar spine CT May 25, 2019. FINDINGS: Lower chest: Bibasilar consolidative opacities. Normal size heart. No pericardial effusion. Coronary artery calcifications. Hepatobiliary: No suspicious hepatic lesion. Cholecystectomy. No biliary ductal dilatation. Pancreas: Unremarkable Spleen: Unremarkable Adrenals/Urinary Tract: Bilateral adrenal glands are unremarkable. No hydronephrosis. No nephrolithiasis. Bladder is unremarkable. Stomach/Bowel: Stomach is distended without evidence of wall thickening. No suspicious small bowel wall thickening or dilation. Normal appendix. Redundant sigmoid colon. Moderate volume of formed stool throughout the colon. Colonic diverticulosis without findings of acute diverticulosis. Vascular/Lymphatic: Aortic atherosclerosis. No enlarged abdominal or pelvic lymph nodes. Reproductive: Prostate is unremarkable. Other: No abdominopelvic ascites. Musculoskeletal: L4-L5 posterior lumbar fusion and disc spacer. No significant change in the L1 superior endplate compression deformity. IMPRESSION: 1. No hydronephrosis. 2. Bibasilar consolidative opacities, which could reflect atelectasis or pneumonia. 3. Colonic diverticulosis without findings of acute diverticulosis. 4. Moderate volume of formed stool throughout the colon. 5. Aortic atherosclerosis. Aortic Atherosclerosis (ICD10-I70.0). Electronically Signed   By: Maudry Mayhew MD   On: 02/17/2020 17:24   VAS US CAROTID  Result Date: 02/03/2020 Carotid Arterial Duplex Study Indications:       Bilateral bruits and Lt Subc stent. Risk Factors:      Hypertension, hyperlipidemia, Diabetes, current smoker,                    coronary artery disease, PAD. Other Factors:     CHF. Comparison Study:  08/28/2014 showed normal ICA velocities, bilaterally. Performing  Technologist: Joaquim Nam  Examination Guidelines: A complete evaluation includes B-mode imaging, spectral Doppler, color Doppler, and power Doppler as needed of all accessible portions of each vessel. Bilateral testing is considered an integral part of a complete examination. Limited examinations for reoccurring indications may be performed as noted.  Right Carotid Findings: +----------+--------+--------+--------+-------------------------------+--------+           PSV cm/sEDV cm/sStenosisPlaque Description             Comments +----------+--------+--------+--------+-------------------------------+--------+ CCA Prox  111     24                                                      +----------+--------+--------+--------+-------------------------------+--------+ CCA Mid   107     31                                                      +----------+--------+--------+--------+-------------------------------+--------+ CCA Distal100     25                                                      +----------+--------+--------+--------+-------------------------------+--------+ ICA Prox  125     37              diffuse, irregular and calcific         +----------+--------+--------+--------+-------------------------------+--------+ ICA Mid   170     55      40-59%  calcific and diffuse                    +----------+--------+--------+--------+-------------------------------+--------+ ICA Distal159     52                                                      +----------+--------+--------+--------+-------------------------------+--------+ ECA       159     20              focal and calcific                      +----------+--------+--------+--------+-------------------------------+--------+ +----------+--------+-------+----------------+-------------------+           PSV cm/sEDV cmsDescribe        Arm Pressure (mmHG)  +----------+--------+-------+----------------+-------------------+ [id number]             Multiphasic, WNL99                  +----------+--------+-------+----------------+-------------------+ +---------+--------+--+--------+--+---------+ VertebralPSV cm/s68EDV cm/s27Antegrade +---------+--------+--+--------+--+---------+  Left Carotid Findings: +----------+--------+--------+--------+-------------------------------+--------+           PSV cm/sEDV cm/sStenosisPlaque Description             Comments +----------+--------+--------+--------+-------------------------------+--------+ CCA Prox  106     18      <50%    diffuse, irregular and calcific         +----------+--------+--------+--------+-------------------------------+--------+ CCA Mid   105     26                                                      +----------+--------+--------+--------+-------------------------------+--------+ CCA Distal108     33                                                      +----------+--------+--------+--------+-------------------------------+--------+  ICA Prox  166     58      40-59%  focal, smooth and calcific              +----------+--------+--------+--------+-------------------------------+--------+ ICA Mid   150     40                                                      +----------+--------+--------+--------+-------------------------------+--------+ ICA Distal139     44                                                      +----------+--------+--------+--------+-------------------------------+--------+ ECA       148     17                                                      +----------+--------+--------+--------+-------------------------------+--------+ +----------+--------+--------+----------------+-------------------+           PSV cm/sEDV cm/sDescribe        Arm Pressure (mmHG) +----------+--------+--------+----------------+-------------------+  Subclavian102             Multiphasic, UXL24                  +----------+--------+--------+----------------+-------------------+ +---------+--------+--+--------+-+---------+ VertebralPSV cm/s30EDV cm/s9Antegrade +---------+--------+--+--------+-+---------+  Summary: Right Carotid: Velocities in 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 Left Carotid: Velocities in the left ICA are consistent with a 40-59% stenosis. Vertebrals:  Bilateral vertebral arteries demonstrate antegrade flow. Subclavians: Normal flow hemodynamics were seen in bilateral subclavian              arteries. *See table(s) above for measurements and observations. Suggest follow up study in 12 months. Electronically signed by Charlton Haws MD on 02/03/2020 at 12:59:09 PM.    Final (Updated)       ASSESSMENT/PLAN   Acute on chronic hypoxemic respiratory failure -Due to moderate acute exacerbation of COPD -Complicated by bibasilar atelectasis -Agree with Levoquin for AECOPD - Solu-Medrol from 40 2 times daily>>>once daily  -PT OT when able -I-S at bedside encouraged to use multiple times each hour -Acapella device use as able several times daily -Roflumilast 250 MCG p.o. daily -respiratory cultures -currently on 4L/min [place]>>3L/min -midodrine 10 tid -lasix 40 daily IV->>diuresed overnight feels much imrpoved -increased dilaudid to  q3h with diphenhydramine - patient has advanced copd with chronic pain syndrome and elevated tolerance.   -d/c planning today - added Robinul  tid for heavy phlegm Thank you for allowing me to participate in the care of this patient.   Patient/Family are satisfied with care plan and all questions have been answered.   This document was prepared using Dragon voice recognition software and may include unintentional dictation errors.     Vida Rigger, M.D.  Division of Pulmonary & Critical Care Medicine  [facility]

## 2020-02-21 NOTE — Progress Notes (Signed)
Pulmonologist came to room and spoke to patient. Told patient that he is medically clear for discharge and patient stated he would cancel appeal for discharge. Patient has called and canceled appeal. Working on discharge and transportation for patient to go back home.

## 2020-02-21 NOTE — Discharge Summary (Signed)
Physician Discharge Summary  Gregory Hall MVH:[id number] DOB: [birth year] DOA: 02/17/2020  PCP: Gregory Bound, NP  Admit date: 02/17/2020 Discharge date: 02/21/2020  Admitted From: Group home Disposition: Group home  Recommendations for Outpatient Follow-up:  1. Follow up with PCP in 1-2 weeks 2. Follow-up with pulmonology 3. Please obtain BMP/CBC in one week 4. 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 The staff at [facility] clinic reported a blood pressure of 60/40. Patient complained of feeling dizzy and lightheaded but denied having any falls or loss of consciousness. Labs within normal limit except AKI and x-ray with hyperinflated lungs and no new infiltrate.  His AKI resolved with IV fluid.  He had syncopal episode secondary to hypotension which responded to IV fluid. We initially held his home torsemide, which can be resumed on discharge. He will continue with midodrine and encouraged p.o. hydration.  Patient has chronic respiratory failure secondary to COPD and uses 3 to 4 L of oxygen.  Has chronic expiratory wheeze and prolonged expiratory phase. Remained stable from respiratory standpoint.  There was some concern of pneumonia although there  was no infiltrate on chest x-ray.  Pulmonary was also consulted and because of his high risk he was given antibiotics, initially with cefepime and later transitioned to Levaquin to complete a 5-day course. Patient continued to complain tight chest, stating that he has some phlegm that he cannot get out.  He was given Mucinex and Robinul with not much relief. Most of the time it seems like subjective complaint as his saturation remained stable, no other clinical indication for any obstruction. He was discharged home on higher dose of prednisone and will follow up with pulmonology as an outpatient.  Patient takes prednisone at baseline, can resume his home dose after finishing 5 days of 50 mg of prednisone.  Patient has  Chronic pain syndrome, not sure about the reason.  Follow-up with pain.  He was given Dilaudid along with his home regimen of oxycodone when he continued to complain about worsening pain.  Patient continued to ask for more Dilaudid, clinically does not appear in pain, concern of drug-seeking behavior.  He needs to follow-up with his pain clinic for further management.  He also has an history of atrial flutter, heart rate remained controlled and he will continue home dose of amiodarone and Xarelto.  He will continue rest of his home medications and follow-up with his providers.  Discharge Diagnoses:  Principal Problem:   AKI (acute kidney injury) (HCC) Active Problems:   COPD with acute exacerbation (HCC)   CAD (coronary artery disease)   Atrial flutter (HCC)   Chronic respiratory failure with hypoxia (HCC)   Diabetes mellitus without complication (HCC)   Diastolic dysfunction with chronic heart failure (HCC)   Near syncope   Discharge Instructions  Discharge Instructions    Diet - low sodium heart healthy   Complete by: As directed  Discharge instructions   Complete by: As directed    It was pleasure taking care of you. Please take prednisone 50 mg daily for next 5  days and then resume your usual home dose. Follow-up with your lung doctor. Keep yourself well-hydrated   Discharge instructions   Complete by: As directed    It was pleasure taking care of you. Continue taking your medications along with few new to help with your breathing. You are being given antibiotics for 2 more days. He will continue with high-dose prednisone for 5 more days and then resume your normal home dose. Follow-up with your pulmonologist within 1 week for further recommendations. Keep yourself well-hydrated.   Increase activity slowly   Complete by: As directed    Increase activity slowly   Complete by: As directed      Allergies as of 02/21/2020      Reactions   Acetaminophen Other (See Comments)   Reaction:  Unknown  Kidney failure Told not to take from home M.D. Related to kidney and renal failure   Fentanyl    Nsaids Other (See Comments)   Reaction:  Unknown  Kidney failure Patient states not to take from home M.D. Related to kidney and renal failure. Other reaction(s): Unknown      Medication List    TAKE these medications   amiodarone 200 MG tablet Commonly known as: PACERONE Take 1 tablet (200 mg total) by mouth daily.   atorvastatin 40 MG tablet Commonly known as: LIPITOR Take 1 tablet (40 mg total) by mouth daily.   baclofen 20 MG tablet Commonly known as: LIORESAL Take 20 mg by mouth 3 (three) times daily.   budesonide 0.5 MG/2ML nebulizer solution Commonly known as: PULMICORT Take 0.5 mg by nebulization 2 (two) times daily.   DULoxetine 60 MG capsule Commonly known as: CYMBALTA Take 60 mg by mouth daily.   Ensure Max Protein Liqd Take 330 mLs (11 oz total) by mouth 2 (two) times daily between meals.   fexofenadine 180 MG tablet Commonly known as: ALLEGRA Take 180 mg by mouth daily.   fluticasone 50 MCG/ACT nasal spray Commonly known as: FLONASE Place 1 spray into both nostrils 2 (two) times daily.   glyBURIDE 2.5 MG  tablet Commonly known as: DIABETA Take 2.5 mg by mouth daily.   glycopyrrolate 1 MG tablet Commonly known as: ROBINUL Take 1 tablet (1 mg total) by mouth 3 (three) times daily.   guaiFENesin 600 MG 12 hr tablet Commonly known as: MUCINEX Take 1 tablet (600 mg total) by mouth 2 (two) times daily.   insulin glargine 100 UNIT/ML injection Commonly known as: LANTUS Inject 11 Units into the skin at bedtime.   insulin lispro 100 UNIT/ML injection Commonly known as: HUMALOG Inject 4 Units into the skin See admin instructions. Inject 4 units with blood sugar 150-250; inject 6 units 251-350; greater than 351 call provider; Hold for FSBG <150   ipratropium-albuterol 0.5-2.5 (3) MG/3ML Soln Commonly known as: DUONEB Take 3 mLs by nebulization every 6 (six) hours as needed. What changed: reasons to take this   lamoTRIgine 100 MG tablet Commonly known as: LAMICTAL Take 100 mg by mouth daily.   levofloxacin 500 MG tablet Commonly known as: LEVAQUIN Take 1 tablet (500 mg total) by mouth daily for 2 days. Start taking on: February 22, 2020   magnesium oxide 400 (241.3 Mg) MG tablet Commonly known as: MAG-OX Take 1 tablet by mouth daily.   midodrine 10 MG tablet Commonly known  as: PROAMATINE Take 1 tablet (10 mg total) by mouth 2 (two) times daily. Take if top blood pressure reading is less than 100   NAC 600 MG Caps Generic drug: Acetylcysteine Take 600 mg by mouth daily.   Nitrostat 0.4 MG SL tablet Generic drug: nitroGLYCERIN DISSOLVE (1) TABLET UNDER TONGUE AS NEEDED TO RELIEVE CHEST PAIN. MAYREPEAT EVERY 5 MINUTES. What changed: See the new instructions.   ondansetron 4 MG disintegrating tablet Commonly known as: ZOFRAN-ODT Take 1 tablet (4 mg total) by mouth every 8 (eight) hours as needed for nausea or vomiting.   Oxycodone HCl 10 MG Tabs Take 1 tablet (10 mg total) by mouth 3 (three) times daily. What changed: when to take this   oxymetazoline 0.05 % nasal  spray Commonly known as: AFRIN Place 2 sprays into both nostrils 2 (two) times daily as needed for congestion.   pantoprazole 40 MG tablet Commonly known as: PROTONIX Take 40 mg by mouth daily.   predniSONE 2.5 MG tablet Commonly known as: DELTASONE Take by mouth. What changed: Another medication with the same name was added. Make sure you understand how and when to take each.   predniSONE 50 MG tablet Commonly known as: DELTASONE Take 1 tablet daily for 5 days What changed: You were already taking a medication with the same name, and this prescription was added. Make sure you understand how and when to take each.   pregabalin 200 MG capsule Commonly known as: LYRICA Take 1 capsule (200 mg total) by mouth 3 (three) times daily.   rivaroxaban 20 MG Tabs tablet Commonly known as: XARELTO Take 20 mg by mouth daily with supper.   roflumilast 500 MCG Tabs tablet Commonly known as: DALIRESP Take 0.5 tablets (250 mcg total) by mouth daily.   senna-docusate 8.6-50 MG tablet Commonly known as: Senokot-S Take 1 tablet by mouth daily.   sodium chloride 0.65 % Soln nasal spray Commonly known as: OCEAN Place 1 spray into both nostrils as needed for congestion.   tamsulosin 0.4 MG Caps capsule Commonly known as: FLOMAX Take 1 capsule (0.4 mg total) by mouth daily after supper.   torsemide 20 MG tablet Commonly known as: DEMADEX Take 2 tablets (40 mg total) by mouth daily.   traZODone 150 MG tablet Commonly known as: DESYREL Take 150 mg by mouth at bedtime.   Trelegy Ellipta 100-62.5-25 MCG/INH Aepb Generic drug: Fluticasone-Umeclidin-Vilant Inhale 1 puff into the lungs daily.   Ubrelvy 100 MG Tabs Generic drug: Ubrogepant Take 100 mg by mouth daily. Start taking on: February 22, 2020 What changed:   how much to take  when to take this   Vitamin D3 50 MCG (2000 UT) Tabs Take 2,000 Units by mouth daily.       Follow-up Information    Gregory Bound, NP. Schedule  an appointment as soon as possible for a visit.   Specialty: Nurse Practitioner Contact information: [address] [place] [address] [phone]        Iran Ouch, MD .   Specialty: Cardiology Contact information: [address] [place] [address] [phone]        Vida Rigger, MD Follow up.   Specialty: Pulmonary Disease Contact information: [address] [place] [address] [phone]              Allergies  Allergen Reactions  . Acetaminophen Other (See Comments)    Reaction:  Unknown  Kidney failure Told not to take from home  M.D. Related to kidney and renal failure   . Fentanyl   . Nsaids Other (See Comments)    Reaction:  Unknown  Kidney failure Patient states not to take from home M.D. Related to kidney and renal failure.  Other reaction(s): Unknown    Consultations:  Pulmonology  Procedures/Studies: MR BRAIN WO CONTRAST  Result Date: 01/22/2020 CLINICAL DATA:  Hand tremor 1 month.  Confusion. EXAM: MRI HEAD WITHOUT CONTRAST TECHNIQUE: Multiplanar, multiecho pulse sequences of the brain and surrounding structures were obtained without intravenous contrast. COMPARISON:  CT head 10/22/2019, 04/07/2019, 03/04/2015 as well as earlier CT studies. FINDINGS: Brain: Ventricle size normal. Cerebral volume normal. Negative for acute infarct. Normal white matter. No significant chronic ischemia. No hemorrhage. Cystic lesion is present in the right anterior temporal lobe measuring approximately 8 x 11 mm. This is well-defined with surrounding mild hyperintensity. This appears unchanged from CT in 2017 and also present on the more recent CT studies. The lesion is not seen on a CT from 03/30/2013. Vascular: Normal arterial flow voids Skull and upper cervical spine: No focal skeletal lesion. Sinuses/Orbits: Mild to moderate mucosal edema paranasal sinuses. Bilateral cataract extraction Other: None IMPRESSION:  Negative for acute or chronic infarction. 8 x 11 mm cyst in the right anterior temporal lobe with surrounding edema. This is present on prior CT studies in 2017 but not 2015. This could represent a neoplasm. Follow-up MRI brain with contrast recommended for baseline evaluation. Electronically Signed   By: Marlan Palau M.D.   On: 01/22/2020 11:29   DG Chest Port 1 View  Result Date: 02/19/2020 CLINICAL DATA:  Hypertension Hyperlipidemia CAD COPD Abnormal chest x-ray Chronic cough EXAM: PORTABLE CHEST 1 VIEW COMPARISON:  02/18/2020 FINDINGS: The heart size and mediastinal contours are within normal limits. Both lungs are clear. The visualized skeletal structures are unremarkable. Neurostimulator leads again seen in the midthoracic spine. Vascular stent noted in the region of the left subclavian artery. ACDF changes partially visualized lower cervical spine. Curvilinear opacities again seen in the left lung base likely due to scarring. No focal airspace opacity in pneumonia. IMPRESSION: No focal airspace opacity to indicate pneumonia. Electronically Signed   By: Acquanetta Belling M.D.   On: 02/19/2020 12:51   DG Chest Port 1 View  Result Date: 02/18/2020 CLINICAL DATA:  Cough and shortness of breath EXAM: PORTABLE CHEST 1 VIEW COMPARISON:  February 17, 2020 FINDINGS: There are areas of atelectatic change in each lung base. There is no evident edema or airspace opacity. Heart size and pulmonary vascularity are normal. No adenopathy. There is aortic atherosclerosis. Thoracic stimulator leads are present in the midthoracic region, stable. There is postoperative change in the lower cervical region. There is a stent in the left innominate vein region, stable. IMPRESSION: Bibasilar atelectasis. No edema or airspace opacity. Stable cardiac silhouette. Aortic Atherosclerosis (ICD10-I70.0). Electronically Signed   By: Bretta Bang III M.D.   On: 02/18/2020 15:22   DG Chest Portable 1 View  Result Date:  02/17/2020 CLINICAL DATA:  Shortness of breath EXAM: PORTABLE CHEST 1 VIEW COMPARISON:  12/13/2018 FINDINGS: Cardiac shadow is within normal limits. Aortic calcifications are seen. Left subclavian arterial stenting is noted. Spinal stimulator is seen. The lungs are well aerated bilaterally. Mild patchy bibasilar atelectatic changes are seen. No bony abnormality is seen. IMPRESSION: Mild basilar atelectasis Electronically Signed   By: Alcide Clever M.D.   On: 02/17/2020 11:57   CT RENAL STONE STUDY  Result Date: 02/17/2020 CLINICAL DATA:  Acute renal  failure. EXAM: CT ABDOMEN AND PELVIS WITHOUT CONTRAST TECHNIQUE: Multidetector CT imaging of the abdomen and pelvis was performed following the standard protocol without IV contrast. COMPARISON:  CT abdomen pelvis June 24, 2012 and lumbar spine CT May 25, 2019. FINDINGS: Lower chest: Bibasilar consolidative opacities. Normal size heart. No pericardial effusion. Coronary artery calcifications. Hepatobiliary: No suspicious hepatic lesion. Cholecystectomy. No biliary ductal dilatation. Pancreas: Unremarkable Spleen: Unremarkable Adrenals/Urinary Tract: Bilateral adrenal glands are unremarkable. No hydronephrosis. No nephrolithiasis. Bladder is unremarkable. Stomach/Bowel: Stomach is distended without evidence of wall thickening. No suspicious small bowel wall thickening or dilation. Normal appendix. Redundant sigmoid colon. Moderate volume of formed stool throughout the colon. Colonic diverticulosis without findings of acute diverticulosis. Vascular/Lymphatic: Aortic atherosclerosis. No enlarged abdominal or pelvic lymph nodes. Reproductive: Prostate is unremarkable. Other: No abdominopelvic ascites. Musculoskeletal: L4-L5 posterior lumbar fusion and disc spacer. No significant change in the L1 superior endplate compression deformity. IMPRESSION: 1. No hydronephrosis. 2. Bibasilar consolidative opacities, which could reflect atelectasis or pneumonia. 3. Colonic  diverticulosis without findings of acute diverticulosis. 4. Moderate volume of formed stool throughout the colon. 5. Aortic atherosclerosis. Aortic Atherosclerosis (ICD10-I70.0). Electronically Signed   By: Maudry Mayhew MD   On: 02/17/2020 17:24   VAS US CAROTID  Result Date: 02/03/2020 Carotid Arterial Duplex Study Indications:       Bilateral bruits and Lt Subc stent. Risk Factors:      Hypertension, hyperlipidemia, Diabetes, current smoker,                    coronary artery disease, PAD. Other Factors:     CHF. Comparison Study:  08/28/2014 showed normal ICA velocities, bilaterally. Performing Technologist: Joaquim Nam  Examination Guidelines: A complete evaluation includes B-mode imaging, spectral Doppler, color Doppler, and power Doppler as needed of all accessible portions of each vessel. Bilateral testing is considered an integral part of a complete examination. Limited examinations for reoccurring indications may be performed as noted.  Right Carotid Findings: +----------+--------+--------+--------+-------------------------------+--------+           PSV cm/sEDV cm/sStenosisPlaque Description             Comments +----------+--------+--------+--------+-------------------------------+--------+ CCA Prox  111     24                                                      +----------+--------+--------+--------+-------------------------------+--------+ CCA Mid   107     31                                                      +----------+--------+--------+--------+-------------------------------+--------+ CCA Distal100     25                                                      +----------+--------+--------+--------+-------------------------------+--------+ ICA Prox  125     37              diffuse, irregular and calcific         +----------+--------+--------+--------+-------------------------------+--------+ ICA Mid   170  55      40-59%  calcific and diffuse                     +----------+--------+--------+--------+-------------------------------+--------+ ICA Distal159     52                                                      +----------+--------+--------+--------+-------------------------------+--------+ ECA       159     20              focal and calcific                      +----------+--------+--------+--------+-------------------------------+--------+ +----------+--------+-------+----------------+-------------------+           PSV cm/sEDV cmsDescribe        Arm Pressure (mmHG) +----------+--------+-------+----------------+-------------------+ [id number]             Multiphasic, [id number]                  +----------+--------+-------+----------------+-------------------+ +---------+--------+--+--------+--+---------+ VertebralPSV cm/s68EDV cm/s27Antegrade +---------+--------+--+--------+--+---------+  Left Carotid Findings: +----------+--------+--------+--------+-------------------------------+--------+           PSV cm/sEDV cm/sStenosisPlaque Description             Comments +----------+--------+--------+--------+-------------------------------+--------+ CCA Prox  106     18      <50%    diffuse, irregular and calcific         +----------+--------+--------+--------+-------------------------------+--------+ CCA Mid   105     26                                                      +----------+--------+--------+--------+-------------------------------+--------+ CCA Distal108     33                                                      +----------+--------+--------+--------+-------------------------------+--------+ ICA Prox  166     58      40-59%  focal, smooth and calcific              +----------+--------+--------+--------+-------------------------------+--------+ ICA Mid   150     40                                                       +----------+--------+--------+--------+-------------------------------+--------+ ICA Distal139     44                                                      +----------+--------+--------+--------+-------------------------------+--------+ ECA       148     17                                                      +----------+--------+--------+--------+-------------------------------+--------+ +----------+--------+--------+----------------+-------------------+  PSV cm/sEDV cm/sDescribe        Arm Pressure (mmHG) +----------+--------+--------+----------------+-------------------+ Subclavian102             Multiphasic, [id number]                  +----------+--------+--------+----------------+-------------------+ +---------+--------+--+--------+-+---------+ VertebralPSV cm/s30EDV cm/s9Antegrade +---------+--------+--+--------+-+---------+  Summary: Right Carotid: Velocities in 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 Left Carotid: Velocities in the left ICA are consistent with a 40-59% stenosis. Vertebrals:  Bilateral vertebral arteries demonstrate antegrade flow. Subclavians: Normal flow hemodynamics were seen in bilateral subclavian              arteries. *See table(s) above for measurements and observations. Suggest follow up study in 12 months. Electronically signed by Charlton Haws MD on 02/03/2020 at 12:59:09 PM.    Final (Updated)      Subjective: Patient was feeling close to his baseline when seen today.  Ready to go home.  Continues to have some chest tightness, advised to keep working with incentive spirometry and flutter valve and follow-up with pulmonologist next week.  Discharge Exam: Vitals:   02/21/20 0741 02/21/20 0755  BP: (!) 134/58   Pulse: 72 73  Resp: 16 16  Temp: 98.6 F (37 C)   SpO2: 93% 92%   Vitals:   02/20/20 1958 02/21/20 0419 02/21/20 0741 02/21/20 0755  BP:  (!) 144/62 (!) 134/58   Pulse:  67 72 73  Resp:  Temp:  98.6 F (37 C) 98.6 F (37 C)   TempSrc:  Oral    SpO2: 96% 94% 93% 92%  Weight:      Height:        General: Pt is alert, awake, not in acute distress Cardiovascular: RRR, S1/S2 +, no rubs, no gallops Respiratory: Few scattered wheeze with prolonged expiratory phase, normal work of breathing. Abdominal: Soft, NT, ND, bowel sounds + Extremities: no edema, no cyanosis   The results of significant diagnostics from this hospitalization (including imaging, microbiology, ancillary and laboratory) are listed below for reference.    Microbiology: Recent Results (from the past 240 hour(s))  Culture, blood (routine x 2)     Status: None (Preliminary result)   Collection Time: 02/17/20 11:48 AM   Specimen: BLOOD  Result Value Ref Range Status   Specimen Description BLOOD RIGHT FA  Final   Special Requests   Final    BOTTLES DRAWN AEROBIC AND ANAEROBIC Blood Culture adequate volume   Culture   Final    NO GROWTH 4 DAYS Performed at [facility], [address], [place], [place] [address]    Report Status PENDING  Incomplete  Culture, blood (routine x 2)     Status: None (Preliminary result)   Collection Time: 02/17/20 12:10 PM   Specimen: BLOOD  Result Value Ref Range Status   Specimen Description BLOOD LEFT AC  Final   Special Requests   Final    BOTTLES DRAWN AEROBIC AND ANAEROBIC Blood Culture adequate volume   Culture   Final    NO GROWTH 4 DAYS Performed at [facility], [address] [place], [place] [address]    Report Status PENDING  Incomplete  Resp Panel by RT-PCR (Flu A&B, Covid) Nasopharyngeal Swab     Status: None   Collection Time: 02/17/20 12:39 PM   Specimen: Nasopharyngeal Swab; Nasopharyngeal(NP) swabs in vial transport medium  Result  Value Ref Range Status   SARS Coronavirus 2 by RT PCR NEGATIVE NEGATIVE Final    Comment: (NOTE) SARS-CoV-2 target nucleic acids are NOT DETECTED.  The SARS-CoV-2 RNA is generally detectable in  upper respiratory specimens during the acute phase of infection. The lowest concentration of SARS-CoV-2 viral copies this assay can detect is 138 copies/mL. A negative result does not preclude SARS-Cov-2 infection and should not be used as the sole basis for treatment or other patient management decisions. A negative result may occur with  improper specimen collection/handling, submission of specimen other than nasopharyngeal swab, presence of viral mutation(s) within the areas targeted by this assay, and inadequate number of viral copies(<138 copies/mL). A negative result must be combined with clinical observations, patient history, and epidemiological information. The expected result is Negative.  Fact Sheet for Patients:  BloggerCourse.com  Fact Sheet for Healthcare Providers:  SeriousBroker.it  This test is no t yet approved or cleared by the [place] FDA and  has been authorized for detection and/or diagnosis of SARS-CoV-2 by FDA under an Emergency Use Authorization (EUA). This EUA will remain  in effect (meaning this test can be used) for the duration of the COVID-19 declaration under Section 564(b)(1) of the Act, 21 U.S.C.section 360bbb-3(b)(1), unless the authorization is terminated  or revoked sooner.       Influenza A by PCR NEGATIVE NEGATIVE Final   Influenza B by PCR NEGATIVE NEGATIVE Final    Comment: (NOTE) The Xpert Xpress SARS-CoV-2/FLU/RSV plus assay is intended as an aid in the diagnosis of influenza from Nasopharyngeal swab specimens and should not be used as a sole basis for treatment. Nasal washings and aspirates are unacceptable for Xpert Xpress SARS-CoV-2/FLU/RSV testing.  Fact Sheet for Patients: BloggerCourse.com  Fact Sheet for Healthcare Providers: SeriousBroker.it  This test is not yet approved or cleared by the [place] FDA and has been  authorized for detection and/or diagnosis of SARS-CoV-2 by FDA under an Emergency Use Authorization (EUA). This EUA will remain in effect (meaning this test can be used) for the duration of the COVID-19 declaration under Section 564(b)(1) of the Act, 21 U.S.C. section 360bbb-3(b)(1), unless the authorization is terminated or revoked.  Performed at [facility], [address] [place], [place] [address]   Acid Fast Smear (AFB)     Status: None   Collection Time: 02/18/20  8:15 PM   Specimen: Sputum  Result Value Ref Range Status   AFB Specimen Processing Concentration  Final   Acid Fast Smear Negative  Final    Comment: (NOTE) Performed At: [facility] [address] [place], [place] [id number] Jolene Schimke MD [id number]    Source (AFB) SPUTUM  Final    Comment: Performed at [facility], [address], [place], [place] [address]  Expectorated sputum assessment w rflx to resp cult     Status: None   Collection Time: 02/18/20  8:15 PM   Specimen: Sputum  Result Value Ref Range Status   Specimen Description SPUTUM  Final   Special Requests NONE  Final   Sputum evaluation   Final    THIS SPECIMEN IS ACCEPTABLE FOR SPUTUM CULTURE Performed at [facility], [address], [place], [place] [address]    Report Status 02/18/2020 FINAL  Final  Culture, respiratory     Status: None (Preliminary result)   Collection Time: 02/18/20  8:15 PM   Specimen: SPU  Result Value Ref Range Status   Specimen Description   Final  SPUTUM Performed at [facility], [address] [place], [place] [address]    Special Requests   Final    NONE Reflexed from [id number] Performed at [facility], [address] [place], [place] [address]    Gram Stain   Final    FEW WBC PRESENT, PREDOMINANTLY PMN NO ORGANISMS SEEN    Culture   Final    CULTURE REINCUBATED FOR BETTER GROWTH Performed at [facility], [address],  [place], [place] [address]    Report Status PENDING  Incomplete  Aspergillus Ag, BAL/Serum     Status: None   Collection Time: 02/19/20  6:44 AM   Specimen: Sputum; Respiratory  Result Value Ref Range Status   Aspergillus Ag, BAL/Serum 0.04 0.00 - 0.49 Index Final    Comment: (NOTE) Performed At: [facility] [address] [place], [place] [id number] Jolene Schimke MD VH:[id number]      Labs: BNP (last 3 results) Recent Labs    10/22/19 2031 12/13/19 0042 02/17/20 1149  BNP 198.3* 620.6* 48.9   Basic Metabolic Panel: Recent Labs  Lab 02/17/20 1149 02/18/20 0550 02/19/20 0644 02/20/20 0635  NA 137 137 136 136  K 3.3* 4.5 5.1 5.0  CL 89* 91* 90* 89*  CO2 35* 32 35* 36*  GLUCOSE 196* 162* 267* 169*  BUN 24* 25* 30* 26*  CREATININE 3.20* 2.03* 1.31* 1.07  CALCIUM 8.7* 8.5* 9.0 9.3  MG  --   --  2.2  --    Liver Function Tests: Recent Labs  Lab 02/17/20 1149  AST 13*  ALT 8  ALKPHOS 86  BILITOT 0.5  PROT 6.8  ALBUMIN 3.1*   No results for input(s): LIPASE, AMYLASE in the last 168 hours. No results for input(s): AMMONIA in the last 168 hours. CBC: Recent Labs  Lab 02/17/20 1149 02/18/20 0550  WBC 9.8 13.0*  NEUTROABS 7.7  --   HGB 10.5* 9.8*  HCT 35.6* 32.9*  MCV 78.6* 77.8*  PLT 354 333   Cardiac Enzymes: No results for input(s): CKTOTAL, CKMB, CKMBINDEX, TROPONINI in the last 168 hours. BNP: Invalid input(s): POCBNP CBG: Recent Labs  Lab 02/20/20 0752 02/20/20 1148 02/20/20 1602 02/20/20 2056 02/21/20 0746  GLUCAP 202* 186* 183* 142* 212*   D-Dimer No results for input(s): DDIMER in the last 72 hours. Hgb A1c No results for input(s): HGBA1C in the last 72 hours. Lipid Profile No results for input(s): CHOL, HDL, LDLCALC, TRIG, CHOLHDL, LDLDIRECT in the last 72 hours. Thyroid function studies No results for input(s): TSH, T4TOTAL, T3FREE, THYROIDAB in the last 72 hours.  Invalid input(s): FREET3 Anemia work up No results for  input(s): VITAMINB12, FOLATE, FERRITIN, TIBC, IRON, RETICCTPCT in the last 72 hours. Urinalysis    Component Value Date/Time   COLORURINE YELLOW (A) 12/13/2019 0042   APPEARANCEUR CLEAR (A) 12/13/2019 0042   APPEARANCEUR Clear 10/28/2013 2215   LABSPEC 1.015 12/13/2019 0042   LABSPEC 1.002 10/28/2013 2215   PHURINE 5.0 12/13/2019 0042   GLUCOSEU NEGATIVE 12/13/2019 0042   GLUCOSEU Negative 10/28/2013 2215   HGBUR NEGATIVE 12/13/2019 0042   BILIRUBINUR NEGATIVE 12/13/2019 0042   BILIRUBINUR Negative 10/28/2013 2215   KETONESUR NEGATIVE 12/13/2019 0042   PROTEINUR NEGATIVE 12/13/2019 0042   UROBILINOGEN 1.0 03/29/2009 0840   NITRITE NEGATIVE 12/13/2019 0042   LEUKOCYTESUR NEGATIVE 12/13/2019 0042   LEUKOCYTESUR Negative 10/28/2013 2215   Sepsis Labs Invalid input(s): PROCALCITONIN,  WBC,  LACTICIDVEN Microbiology Recent Results (from the past 240 hour(s))  Culture, blood (routine x 2)     Status: None (Preliminary result)   Collection Time: 02/17/20 11:48 AM   Specimen: BLOOD  Result Value Ref Range Status   Specimen Description BLOOD RIGHT FA  Final   Special Requests   Final    BOTTLES DRAWN AEROBIC AND ANAEROBIC Blood Culture adequate volume   Culture   Final    NO GROWTH 4 DAYS Performed at [facility], [address], [place], [place] [address]    Report Status PENDING  Incomplete  Culture, blood (routine x 2)     Status: None (Preliminary result)   Collection Time: 02/17/20 12:10 PM   Specimen: BLOOD  Result Value Ref Range Status   Specimen Description BLOOD LEFT AC  Final   Special Requests   Final    BOTTLES DRAWN AEROBIC AND ANAEROBIC Blood Culture adequate volume   Culture   Final    NO GROWTH 4 DAYS Performed at [facility], [address] [place], [place] [address]    Report Status PENDING  Incomplete  Resp Panel by RT-PCR (Flu A&B, Covid) Nasopharyngeal Swab     Status: None   Collection Time: 02/17/20 12:39 PM   Specimen:  Nasopharyngeal Swab; Nasopharyngeal(NP) swabs in vial transport medium  Result Value Ref Range Status   SARS Coronavirus 2 by RT PCR NEGATIVE NEGATIVE Final    Comment: (NOTE) SARS-CoV-2 target nucleic acids are NOT DETECTED.  The SARS-CoV-2 RNA is generally detectable in upper respiratory specimens during the acute phase of infection. The lowest concentration of SARS-CoV-2 viral copies this assay can detect is 138 copies/mL. A negative result does not preclude SARS-Cov-2 infection and should not be used as the sole basis for treatment or other patient management decisions. A negative result may occur with  improper specimen collection/handling, submission of specimen other than nasopharyngeal swab, presence of viral mutation(s) within the areas targeted by this assay, and inadequate number of viral copies(<138 copies/mL). A negative result must be combined with clinical observations, patient history, and epidemiological information. The expected result is Negative.  Fact Sheet for Patients:  BloggerCourse.com  Fact Sheet for Healthcare Providers:  SeriousBroker.it  This test is no t yet approved or cleared by the [place] FDA and  has been authorized for detection and/or diagnosis of SARS-CoV-2 by FDA under an Emergency Use Authorization (EUA). This EUA will remain  in effect (meaning this test can be used) for the duration of the COVID-19 declaration under Section 564(b)(1) of the Act, 21 U.S.C.section 360bbb-3(b)(1), unless the authorization is terminated  or revoked sooner.       Influenza A by PCR NEGATIVE NEGATIVE Final   Influenza B by PCR NEGATIVE NEGATIVE Final    Comment: (NOTE) The Xpert Xpress SARS-CoV-2/FLU/RSV plus assay is intended as an aid in the diagnosis of influenza from Nasopharyngeal swab specimens and should not be used as a sole basis for treatment. Nasal washings and aspirates are unacceptable for  Xpert Xpress SARS-CoV-2/FLU/RSV testing.  Fact Sheet for Patients: BloggerCourse.com  Fact Sheet for Healthcare Providers: SeriousBroker.it  This test is not yet approved or cleared by the [place] FDA and has been authorized for detection and/or diagnosis of SARS-CoV-2 by FDA under an Emergency Use Authorization (EUA). This EUA will remain in effect (meaning this test can be used) for the duration of the COVID-19 declaration under Section 564(b)(1) of the Act, 21 U.S.C. section 360bbb-3(b)(1), unless the authorization is terminated or revoked.  Performed at [facility],  [address] [place], [place] [address]   Acid Fast Smear (AFB)     Status: None   Collection Time: 02/18/20  8:15 PM   Specimen: Sputum  Result Value Ref Range Status   AFB Specimen Processing Concentration  Final   Acid Fast Smear Negative  Final    Comment: (NOTE) Performed At: [facility] [address] [place], [place] [id number] Jolene Schimke MD XB:[id number]    Source (AFB) SPUTUM  Final    Comment: Performed at [facility], [address] [place], [place] [address]  Expectorated sputum assessment w rflx to resp cult     Status: None   Collection Time: 02/18/20  8:15 PM   Specimen: Sputum  Result Value Ref Range Status   Specimen Description SPUTUM  Final   Special Requests NONE  Final   Sputum evaluation   Final    THIS SPECIMEN IS ACCEPTABLE FOR SPUTUM CULTURE Performed at [facility], [address] [place], [place] [address]    Report Status 02/18/2020 FINAL  Final  Culture, respiratory     Status: None (Preliminary result)   Collection Time: 02/18/20  8:15 PM   Specimen: SPU  Result Value Ref Range Status   Specimen Description   Final    SPUTUM Performed at [facility], [address] [place], [place] [address]    Special Requests   Final    NONE Reflexed from  [id number] Performed at [facility], [address] [place], [place] [address]    Gram Stain   Final    FEW WBC PRESENT, PREDOMINANTLY PMN NO ORGANISMS SEEN    Culture   Final    CULTURE REINCUBATED FOR BETTER GROWTH Performed at [facility], [address] [place], [place] [address]    Report Status PENDING  Incomplete  Aspergillus Ag, BAL/Serum     Status: None   Collection Time: 02/19/20  6:44 AM   Specimen: Sputum; Respiratory  Result Value Ref Range Status   Aspergillus Ag, BAL/Serum 0.04 0.00 - 0.49 Index Final    Comment: (NOTE) Performed At: [facility] [address] [place], [place] [id number] Jolene Schimke MD VZ:[id number]     Time coordinating discharge: Over 30 minutes  SIGNED:  Arnetha Courser, MD  [facility] 02/21/2020, 9:45 AM  If 7PM-7AM, please contact night-coverage [email or web address]  This record has been created using Conservation officer, historic buildings. Errors have been sought and corrected,but may not always be located. Such creation errors do not reflect on the standard of care.

## 2020-02-21 NOTE — Progress Notes (Signed)
Pt discharged to group home; transported via EMS; vs obtained; pt placed on 2L 02 via n/c; last dose of IV dilaudid administered; IV removed; catheter tip intact.

## 2020-02-22 LAB — CULTURE, BLOOD (ROUTINE X 2)
Culture: NO GROWTH
Culture: NO GROWTH
Special Requests: ADEQUATE
Special Requests: ADEQUATE

## 2020-03-11 ENCOUNTER — Ambulatory Visit: Payer: Medicare Other | Admitting: Cardiovascular Disease

## 2020-03-11 NOTE — Progress Notes (Signed)
Office Visit    Patient Name: Gregory Hall Date of Encounter: 03/12/2020  Primary Care Provider:  Koren Bound, NP Primary Cardiologist:  Lorine Bears, MD  Chief Complaint    Chief Complaint  Patient presents with  . Other    2-3 month f/u no complaints today. Meds reviewed verbally with pt.    65 year old male with history of CAD s/p PCI to the LAD in 2010, HFpEF, atrial flutter s/p DCCV 09/26/2019, PAD with prior stenting to the left subclavian artery, bilateral carotid disease, CKD stage III, IDDM, hypertension, hyperlipidemia, psychiatric history, chronic hypoxic respiratory failure on supplemental home oxygen secondary to COPD secondary to tobacco use, esophageal spasm, erosive gastritis, GERD, anxiety/depression, chronic back pain, and who presents today for 2-3 month follow-up/recent admission at [facility].  Past Medical History    Past Medical History:  Diagnosis Date  . AKI (acute kidney injury) (HCC) 09/12/2019  . CAD (coronary artery disease)    s/p PTCA and stent x2  . Chest pain   . CHF (congestive heart failure) (HCC)   . Chronic pain syndrome   . COPD (chronic obstructive pulmonary disease) (HCC)   . Degenerative cervical disc   . Depression   . Diabetes mellitus without complication (HCC)   . Dyslipidemia   . GERD (gastroesophageal reflux disease)   . Hernia 2014  . Hypertension   . MRSA (methicillin resistant staph aureus) culture positive 2011  . Neuropathy   . Nutcracker esophagus   . Rectus diastasis 07/19/2012   Past Surgical History:  Procedure Laterality Date  . BACK SURGERY  2012  . CARDIOVERSION N/A 09/26/2019   Procedure: CARDIOVERSION;  Surgeon: Antonieta Iba, MD;  Location: [facility] ORS;  Service: Cardiovascular;  Laterality: N/A;  . CHOLECYSTECTOMY    . COLONOSCOPY  Jan 2014   [name]  . CORONARY ANGIOPLASTY WITH STENT PLACEMENT  2009   stents x2, in [place], [place]  . FOOT SURGERY     Right  . NECK SURGERY    . SPINE SURGERY   2012,2013  . TONSILLECTOMY      Allergies  Allergies  Allergen Reactions  . Acetaminophen Other (See Comments)    Reaction:  Unknown  Kidney failure Told not to take from home M.D. Related to kidney and renal failure   . Fentanyl   . Nsaids Other (See Comments)    Reaction:  Unknown  Kidney failure Patient states not to take from home M.D. Related to kidney and renal failure.  Other reaction(s): Unknown    History of Present Illness    Gregory Hall is a 65 y.o. male with PMH as above.  He has history of CAD s/p PCI to the LAD in 2010, HFpEF, atrial flutter s/p DCCV 09/26/2019, PAD with prior stenting to the left subclavian artery, CKD stage III, IDDM, hypertension, hyperlipidemia, psychiatric history, chronic hypoxic respiratory failure on supplemental home oxygen via nasal cannula at 3 L secondary to COPD secondary to tobacco use, esophageal spasms, as course of gastritis, GERD, anxiety/depression, and chronic back pain s/p multiple surgeries.   He has known lower blood pressure in the left arm with carotid Doppler 08/2014 showing mild nonhemodynamically significant bilateral carotid artery disease with no evidence of subclavian artery stenosis.  Echo 2016 for murmur showed normal LVSF with no significant valvular abnormalities.  He was evaluated 07/20/2015 for preprocedure risk stratification prior to colonoscopy.  He underwent MPI 07/2015 without ischemia and ruled low risk scan.  He was admitted to [facility]  10/2016 for chest pain and unable to complete outpatient dobutamine stress test.   He underwent LHC 10/2016 that showed left main 30% stenosis, patent proximal LAD stent with minor luminal irregularities involving the LAD, left circumflex, and RCA.  LVEF 60%, mildly elevated LVEDP of 17 mmHg.  He was then lost to follow-up.  He was seen in the ED March and May 2021 for falls.  He was admitted to [facility] 06/2019 for COPD exacerbation. He was admitted again with acute on chronic  respiratory failure with hypoxia and hypercapnia in the setting of AE of COPD and acute on chronic HFpEF.  He was treated for COPD flare and IV diuresed.  09/14/2019 echo showed EF 65 to 70%, no RWMA, G2DD.  He was admitted to [facility] 9/12-9/19 for metabolic encephalopathy secondary to acute on chronic hypoxic and hypercapnic respiratory failure requiring mechanical ventilation secondary to AE of COPD and acute on chronic HFpEF, hypotension requiring vasopressor support, ARF requiring temporary dialysis, and new onset atrial flutter with RVR s/p DCCV. He was placed on midodrine. LEE was reported as chronic. He was not salting his food unless eating [place] fries, approximately once every 2 weeks.     Limited echo 09/26/2019 showed EF 55 to 60%.    He developed a resting tremor 9/24, which was new for him.  Due to recent AKI, Lasix was not uptitrated and recommendation was for continued leg elevation and compression stockings.  Sent to the ED 09/2019 due to bilateral lower extremity edema and IV diuresed.   Admitted 9/29-10/1/21 for [facility] HFpEF.  He reported chronic 6 pillow orthopnea due to back pain and shortness of breath.  He was on 3 L nasal cannula at home. Possible right lower extremity cellulitis was noted, and he was started on Keflex. When seen 10/17/2019, he reported tachypalpitations. Right lower extremity still suspicious for cellulitis. He had ongoing malaise and weakness, as well as 6 pillow orthopnea. Bisoprolol held and ASA discontinued.  He was continued on lasix.   On 10/22/2019, he was admitted to [facility] for COPD exacerbation. He complained of pain in his legs.  He required intubation briefly for support. He was continued on amiodarone and Xarelto.  Lasix increased to 40 mg daily at discharge.  He was discharged on abx,  Prednisone, and inhalers. Seen in clinic 11/03/2019 with improved lower extremity edema and euvolemic.  He was sleeping on 1 pillow. Concern still noted for right-sided  cellulitis. Amiodarone was restarted.  ASA discontinued.  Smoking cessation advised. Referral to neurology provided for tremors.  11/2019 ultrasound for the aorta negative for abdominal aortic aneurysm. 01/2020 carotid study with 40 to 59% bilateral ICA stenosis.  Follow-up recommended in 2023.  He was last seen in office by Eula Listen, PA-C 02/16/2020.  He reported tremors, weakness, and headaches at that time.  Bisoprolol discontinued given hypotension.  Midodrine parameters were set.  Weight 202 pounds.  He was admitted 02/17/20 with AKI, HTN, near syncope.  He received IV fluids and diuretics held.  He received antibiotics for his cough / sputum reported.  On 2/10, he was IV diuresed due to volume overload.  He was discharged with recommendation for close pulmonology follow-up.  Today, 03/12/2020, he returns to clinic and notes that he feels improved from his previous visits.  He continues to note shortness of breath, but he states this usually improves if he focuses on his breathing.  He has been working with physical therapy and states he can ambulate 18 feet, at which  point he gets some shortness of breath; however, this also improves with focusing on his breathing.  He reports sleeping in a recliner again; however, he is uncertain if this is due to orthopnea versus comfort. He is about to get a sleep number bed at his facility, which he is excited about.  He is no longer on nasal cannula oxygen today. He reports weight 205 to 207 pounds.  Weight gain noted from previous clinic visit with patient report that he feels this is due to caloric intake, rather than fluid.  He reports that the person making his food at his facility is an excellent cook, and this is the reason for his weight gain.  He has bilateral lower extremity erythema, greater on R than left and R calf pain; however, he reports compliance with anticoagulation.  He states that this finding is not new and has been ongoing for 2 years.  No  palpable cord.  On review of notes, this asymmetric edema and erythema has been noted in the past with concern for cellulitis.  Calf pain occurs both with exertion and at rest, noted in the past, and he feels it has been unchanged over the last 2 years. This is also noted on review of EMR.  He reports cold lower extremities, which he also states has been ongoing for years.  He denies any racing heart rate, palpitations, syncope, or recent falls.  He denies any abdominal distention.  No early satiety. He reports home SBP 110-140 and DBP 60-80.  He continues to note a productive cough with sputum light and tan in color, stating this is unchanged for years.  On review of EMR, cough is improved from his recent admission, during which time he received antibiotics.  He is dizzy if standing too quickly but otherwise no dizziness. He has not needed his midodrine as of late. No signs or symptoms of bleeding.  He reports medication compliance.   ReDS vest declined today.  Home Medications    Prior to Admission medications   Medication Sig Start Date End Date Taking? Authorizing Provider  amiodarone (PACERONE) 200 MG tablet Take 200 mg by mouth daily. Patient not taking: Reported on 10/08/2019    [provider]  aspirin EC 81 MG tablet Take 81 mg by mouth daily.    [provider]  atorvastatin (LIPITOR) 40 MG tablet Take 1 tablet (40 mg total) by mouth daily. 09/28/19   Lewie Chamber, MD  bisoprolol (ZEBETA) 5 MG tablet Take 0.5 tablets (2.5 mg total) by mouth daily. 09/29/19   Lewie Chamber, MD  Cholecalciferol (VITAMIN D3) 50 MCG (2000 UT) TABS Take 2,000 Units by mouth daily.     [provider]  cyclobenzaprine (FLEXERIL) 5 MG tablet Take 1 tablet (5 mg total) by mouth 3 (three) times daily as needed. Patient taking differently: Take 5 mg by mouth 2 (two) times daily as needed for muscle spasms.  05/25/19   Menshew, Charlesetta Ivory, PA-C  DULoxetine (CYMBALTA) 60 MG capsule Take 60  mg by mouth daily.    [provider]  Ensure Max Protein (ENSURE MAX PROTEIN) LIQD Take 330 mLs (11 oz total) by mouth 2 (two) times daily between meals. 06/14/19   Marguerita Merles Latif, DO  furosemide (LASIX) 20 MG tablet Take 1 tablet (20 mg total) by mouth daily. Patient taking differently: Take 20 mg by mouth daily as needed for fluid.  09/14/19   Danford, Earl Lites, MD  insulin glargine (LANTUS) 100 UNIT/ML  injection Inject 22 Units into the skin at bedtime.     [provider]  insulin lispro (HUMALOG) 100 UNIT/ML injection Inject 4 Units into the skin 3 (three) times daily before meals. Hold for FSBG <100    [provider]  ipratropium-albuterol (DUONEB) 0.5-2.5 (3) MG/3ML SOLN Take 3 mLs by nebulization every 6 (six) hours as needed. Patient taking differently: Take 3 mLs by nebulization every 6 (six) hours as needed (wheezing and shortness of breath).  06/14/19   Marguerita Merles Latif, DO  lactulose (CHRONULAC) 10 GM/15ML solution Take 20 g by mouth daily as needed for mild constipation.    [provider]  lamoTRIgine (LAMICTAL) 100 MG tablet Take 100 mg by mouth daily.    [provider]  Menthol-Methyl Salicylate (SALONPAS PAIN RELIEF PATCH EX) Place 1 patch onto the skin every 12 (twelve) hours as needed (aches and pains).    [provider]  metFORMIN (GLUCOPHAGE-XR) 750 MG 24 hr tablet Take 1 tablet (750 mg total) by mouth 2 (two) times daily. 09/16/19   Danford, Earl Lites, MD  midodrine (PROAMATINE) 10 MG tablet Take 1 tablet (10 mg total) by mouth every 8 (eight) hours. 09/28/19   Lewie Chamber, MD  NAC 600 MG CAPS Take 600 mg by mouth daily. 09/08/19   [provider]  nicotine (NICODERM CQ - DOSED IN MG/24 HOURS) 21 mg/24hr patch Place onto the skin.    [provider]  NITROSTAT 0.4 MG SL tablet DISSOLVE (1) TABLET UNDER TONGUE AS NEEDED TO RELIEVE CHEST PAIN. MAYREPEAT EVERY 5 MINUTES. Patient taking  differently: Place 0.4 mg under the tongue every 5 (five) minutes as needed for chest pain.  10/06/15   Iran Ouch, MD  Oxycodone HCl 10 MG TABS Take 1 tablet (10 mg total) by mouth 3 (three) times daily. 09/14/19   Danford, Earl Lites, MD  oxymetazoline (AFRIN) 0.05 % nasal spray Place 2 sprays into both nostrils 2 (two) times daily as needed for congestion.    [provider]  pantoprazole (PROTONIX) 40 MG tablet Take 40 mg by mouth daily.     [provider]  predniSONE (DELTASONE) 5 MG tablet Take 2.5 mg by mouth daily with breakfast.    [provider]  pregabalin (LYRICA) 200 MG capsule Take 1 capsule (200 mg total) by mouth 3 (three) times daily. 09/14/19   Danford, Earl Lites, MD  rivaroxaban (XARELTO) 20 MG TABS tablet Take 20 mg by mouth daily with supper.    [provider]  senna-docusate (SENOKOT-S) 8.6-50 MG tablet Take 1 tablet by mouth daily.     [provider]  tamsulosin (FLOMAX) 0.4 MG CAPS capsule Take 1 capsule (0.4 mg total) by mouth daily after supper. 10/10/19   Lynn Ito, MD  tiotropium (SPIRIVA) 18 MCG inhalation capsule Place into inhaler and inhale.    [provider]  traZODone (DESYREL) 100 MG tablet Take 100 mg by mouth at bedtime. 09/08/19   [provider]    Review of Systems    He denies chest pain, pnd, v, dizziness, palpitations, syncope, weight gain, or early satiety.  He reports ongoing issues with breathing, though improved if he focuses on his breathing closely. He is not bothered by his lower extremity edema, noted on exam.  He reports unchanged cough / sputum. He reports findings of asymmetric edema/erythema and right lower extremity calf pain x2 years (with workup already completed) and reports compliance with anticoagulation.  He reports cold  lower extremities, which is not a new finding.  He is sleeping in a recliner but uncertain if he has orthopena versus comfort.  He reports  resolution of previous weakness and nausea. All other systems reviewed and are otherwise negative except as noted above.  Physical Exam    VS:  BP (!) 118/58 (BP Location: Right Arm, Patient Position: Sitting, Cuff Size: Normal)   Ht 6' (1.829 m)   Wt 207 lb 1 oz (93.9 kg)   SpO2 90%   BMI 28.08 kg/m  , BMI Body mass index is 28.08 kg/m. GEN: Male seated in wheelchair next to exam bed.  Not on oxygen today. HEENT: normal.   Neck: Supple, no JVD.  Bilateral carotid bruits. No masses. Cardiac: RRR, no murmurs, rubs, or gallops. No clubbing, cyanosis, moderate to 1+ bilateral lower extremity edema and erythema, reported as chronic.  RLEE>LLEE, reported as chronic.  Radials 2+ on the right and 1+ on left.  DP/PT 2+ and equal bilaterally.  Respiratory:  Respirations regular and unlabored.  Bilateral reduced breath sounds. GI: Soft, nontender, nondistended, BS + x 4. MS: no deformity or atrophy.  Bilateral upper extremity ecchymosis, chronic finding. Skin: warm and dry, no rash. Neuro:  Strength and sensation are intact.   Psych: Normal affect.  Accessory Clinical Findings    ECG personally reviewed by me today -NSR, 68 bpm, left axis deviation, PR interval 188 ms, QRS 88 ms, QTC 433, baseline wander and artifact- no acute changes.  VITALS Reviewed today   Temp Readings from Last 3 Encounters:  02/21/20 98.6 F (37 C) (Oral)  02/18/20 98.5 F (36.9 C) (Oral)  12/15/19 97.9 F (36.6 C) (Oral)   BP Readings from Last 3 Encounters:  03/12/20 (!) 118/58  02/21/20 137/74  02/16/20 (!) 100/45  NOTE: Repeat BP 10/17/19 at facility in R arm with SBP 100-110s Pulse Readings from Last 3 Encounters:  02/21/20 92  02/16/20 71  02/18/20 78    Wt Readings from Last 3 Encounters:  03/12/20 207 lb 1 oz (93.9 kg)  02/19/20 211 lb 9.6 oz (96 kg)  02/16/20 202 lb (91.6 kg)     LABS  reviewed today   Lab Results  Component Value Date   WBC 13.0 (H) 02/18/2020   HGB 9.8 (L) 02/18/2020    HCT 32.9 (L) 02/18/2020   MCV 77.8 (L) 02/18/2020   PLT 333 02/18/2020   Lab Results  Component Value Date   CREATININE 1.07 02/20/2020   BUN 26 (H) 02/20/2020   NA 136 02/20/2020   K 5.0 02/20/2020   CL 89 (L) 02/20/2020   CO2 36 (H) 02/20/2020   Lab Results  Component Value Date   ALT 8 02/17/2020   AST 13 (L) 02/17/2020   ALKPHOS 86 02/17/2020   BILITOT 0.5 02/17/2020   Lab Results  Component Value Date   CHOL 97 09/25/2019   HDL 28 (L) 09/25/2019   LDLCALC 46 09/25/2019   TRIG 67 10/25/2019   CHOLHDL 3.5 09/25/2019    Lab Results  Component Value Date   HGBA1C 7.9 (H) 02/18/2020   Lab Results  Component Value Date   TSH 1.940 10/17/2019     STUDIES/PROCEDURES reviewed today   Carotid study 01/2020 Summary:  Right Carotid: Velocities in the right ICA are consistent with a 40-59%         stenosis.  Left Carotid: Velocities in the left ICA are consistent with a 40-59%  stenosis.  Vertebrals: Bilateral vertebral arteries demonstrate  antegrade flow.  Subclavians: Normal flow hemodynamics were seen in bilateral subclavian        arteries.   US Aorta 11/2019 IMPRESSION: Sonographic survey negative for abdominal aortic aneurysm   Limited 2D echo 09/26/2019: 1. Left ventricular ejection fraction, by estimation, is 55 to 60%. The  left ventricle has normal function. The left ventricle has no regional  wall motion abnormalities. Indeterminate diastolic filling due to E-A  fusion.  2. Right ventricular systolic function is normal. The right ventricular  size is normal. There is normal pulmonary artery systolic pressure.  3. The mitral valve was not well visualized. No evidence of mitral valve  regurgitation. No evidence of mitral stenosis.  4. The aortic valve was not well visualized. Aortic valve regurgitation  is not visualized. Mild aortic valve sclerosis is present, with no  evidence of aortic valve stenosis.  5. Normal sinus  rhythm. _________  2D echo 09/14/2019: 1. Left ventricular ejection fraction, by estimation, is 65 to 70%. The  left ventricle has normal function. The left ventricle has no regional  wall motion abnormalities. Left ventricular diastolic parameters are  consistent with Grade II diastolic  dysfunction (pseudonormalization).  2. Right ventricular systolic function is normal. The right ventricular  size is normal. There is normal pulmonary artery systolic pressure.  3. The mitral valve is normal in structure. No evidence of mitral valve  regurgitation.  4. The aortic valve was not well visualized. Aortic valve regurgitation  not assessed.  5. Pulmonic valve regurgitation not assessed.  6. The inferior vena cava is normal in size with greater than 50%  respiratory variability, suggesting right atrial pressure of 3 mmHg. __________  LHC 10/2016: Conclusions:   Nonobstructive coronary artery disease with wide open LAD stent. 30% in  the ostial left main   Normal left ventricle size and function with an ejection fraction of 60%   Mildly elevated left and end diastolic pressure at 17 mmHg   FINDINGS:   Dominance: Right   Left Main: 30% ostial left main.   LAD: LAD proximal stent is wide open. Minor luminal irregularities with  no crackles stenosis   CIRC: Minor luminal irregularities and no critical stenosis   RCA: Minor luminal irregularities with no crackles stenosis   Left Ventriculogram:  Normal left medical size and function with an  ejection fraction of 60%   Hemodynamics: Mildly elevated left and end diastolic pressure at 17 mmHg  __________  Eugenie Birks MPI 07/2015: Pharmacological myocardial perfusion imaging study with no significant ischemia Normal wall motion, EF estimated at 92% No EKG changes concerning for ischemia at peak stress or in recovery. Baseline EKG with diffuse T wave abnormality Low risk scan __________  2D echo 08/2014: - Left  ventricle: The cavity size was normal. Wall thickness was  normal. Systolic function was normal. The estimated ejection  fraction was in the range of 60% to 65%. Wall motion was normal;  there were no regional wall motion abnormalities. Left  ventricular diastolic function parameters were normal.   Impressions:   - Normal study.   Assessment & Plan    CAD without angina --No CP or sx concerning for angina. S/p PCI to LAD 2010.   Continue Xarelto in lieu of ASA.  Continue statin.  Beta-blocker discontinued due to hypotension.  No further ischemic work-up at this time.  Chronic HFpEF --Reports improvement in breathing status. Ongoing sx of LEE, productive cough, and difficulty with laying flat that he reports is not new for him.  Not on oxygen today.  Increase in weight, attributed to caloric intake.  Working with PT and ambulated at least 15 feet before becoming short of breath. Declined ReDS, medication changes today. Continue leg elevation and compression stockings. Reviewed that he should have low threshold for repeat LE studies given his erythema and edema on exam, as well as leg pain, but with pt preference to defer. Discussed low-salt diet and fluid restriction under 2 L.  Continue torsemide 40 mg daily.  Atrial flutter with RVR s/p DCCV 9/17 --Maintaining NSR with rates controlled s/p DCCV 9/17.  Developed atrial flutter 9/13. Recurrent atrial flutter 9/16 without response to IV digoxin and IV amiodarone; therefore, underwent DCCV 9/17. Continue current amiodarone 200mg  daily.  Continue monitoring studies and labs as per guidelines. Continue Xarelto 20 mg for OAC. CHA2DS2VASc score of at least 5 (CHF, HTN, agex1, DM2 vascular). No s/sx of bleeding.   PAD Carotid artery disease L subclavian artery stenosis --Bilateral carotid bruit on exam.  S/p subclavian artery stenting with normal flow hemodynamics by ultrasound 01/2020.  Blood pressure taken in both arms, given BP always lower  in the left arm.  Carotid artery ultrasound 01/2020 with 40 to 59% bilateral ICA stenosis.  Follow-up carotid ultrasound 01/2021 ordered.  Reviewed that he should have low threshold for repeat LE studies given his erythema and chronic leg pain / coldness, but with pt preference to defer. Continue Xarelto in place of ASA.  Ongoing control of LDL and lifestyle factors.  COPD, Tobacco use Chronic hypoxic and hypercapnic respiratory failure --Not using oxygen today with reported improvement in breathing status. Smoking cessation advised with previous report he quit smoking.  HTN History of hypotension --BP taken in right arm, as left arm is continuously lower than that of right 2/2 left subclavian artery stenosis.  BP today well controlled.  No signs or symptoms of presyncope or syncope.  He has not used midodrine since his last visit.  HLD --Continue current atorvastatin.  Tremor --Per neurology.   Disposition: RTC 6 months   Lennon Alstrom, PA-C 03/12/2020

## 2020-03-12 ENCOUNTER — Other Ambulatory Visit: Payer: Self-pay

## 2020-03-12 ENCOUNTER — Ambulatory Visit (INDEPENDENT_AMBULATORY_CARE_PROVIDER_SITE_OTHER): Payer: Medicare Other | Admitting: Physician Assistant

## 2020-03-12 ENCOUNTER — Encounter: Payer: Self-pay | Admitting: Physician Assistant

## 2020-03-12 VITALS — BP 89/50 | Ht 72.0 in | Wt 207.1 lb

## 2020-03-12 DIAGNOSIS — I2510 Atherosclerotic heart disease of native coronary artery without angina pectoris: Secondary | ICD-10-CM

## 2020-03-12 DIAGNOSIS — I5032 Chronic diastolic (congestive) heart failure: Secondary | ICD-10-CM | POA: Diagnosis not present

## 2020-03-12 DIAGNOSIS — J449 Chronic obstructive pulmonary disease, unspecified: Secondary | ICD-10-CM

## 2020-03-12 DIAGNOSIS — Z720 Tobacco use: Secondary | ICD-10-CM

## 2020-03-12 DIAGNOSIS — I6523 Occlusion and stenosis of bilateral carotid arteries: Secondary | ICD-10-CM

## 2020-03-12 DIAGNOSIS — E785 Hyperlipidemia, unspecified: Secondary | ICD-10-CM

## 2020-03-12 DIAGNOSIS — I739 Peripheral vascular disease, unspecified: Secondary | ICD-10-CM | POA: Diagnosis not present

## 2020-03-12 DIAGNOSIS — I771 Stricture of artery: Secondary | ICD-10-CM

## 2020-03-12 DIAGNOSIS — I10 Essential (primary) hypertension: Secondary | ICD-10-CM

## 2020-03-12 DIAGNOSIS — E1165 Type 2 diabetes mellitus with hyperglycemia: Secondary | ICD-10-CM

## 2020-03-12 DIAGNOSIS — I4892 Unspecified atrial flutter: Secondary | ICD-10-CM

## 2020-03-12 DIAGNOSIS — E118 Type 2 diabetes mellitus with unspecified complications: Secondary | ICD-10-CM

## 2020-03-12 DIAGNOSIS — IMO0002 Reserved for concepts with insufficient information to code with codable children: Secondary | ICD-10-CM

## 2020-03-12 NOTE — Patient Instructions (Signed)
Medication Instructions:  °Your physician recommends that you continue on your current medications as directed. Please refer to the Current Medication list given to you today. ° ° °*If you need a refill on your cardiac medications before your next appointment, please call your pharmacy* ° ° °Lab Work: °None ordered. ° ° ° °Testing/Procedures: °None ordered. ° ° °Follow-Up: °At [facility], you and your health needs are our priority.  As part of our continuing mission to provide you with exceptional heart care, we have created designated Provider Care Teams.  These Care Teams include your primary Cardiologist (physician) and Advanced Practice Providers (APPs -  Physician Assistants and Nurse Practitioners) who all work together to provide you with the care you need, when you need it. ° °We recommend signing up for the patient portal called "MyChart".  Sign up information is provided on this After Visit Summary.  MyChart is used to connect with patients for Virtual Visits (Telemedicine).  Patients are able to view lab/test results, encounter notes, upcoming appointments, etc.  Non-urgent messages can be sent to your provider as well.   °To learn more about what you can do with MyChart, go to [email or web address].   ° °Your next appointment:   °6 month(s) ° °The format for your next appointment:   °In Person ° °Provider:   °You may see Muhammad Arida, MD or one of the following Advanced Practice Providers on your designated Care Team:   °· Christopher Berge, NP °· Ryan Dunn, PA-C °· Jacquelyn Visser, PA-C °· Cadence Furth, PA-C °· Caitlin Walker, NP ° ° ° ° ° °

## 2020-03-15 ENCOUNTER — Ambulatory Visit
Admission: RE | Admit: 2020-03-15 | Discharge: 2020-03-15 | Disposition: A | Discharge status: Home / Self Care | Payer: Medicare Other | Source: Ambulatory Visit | Attending: Neurology | Admitting: Neurology

## 2020-03-15 ENCOUNTER — Other Ambulatory Visit: Payer: Self-pay

## 2020-03-15 DIAGNOSIS — R9089 Other abnormal findings on diagnostic imaging of central nervous system: Secondary | ICD-10-CM | POA: Diagnosis present

## 2020-03-15 DIAGNOSIS — R4189 Other symptoms and signs involving cognitive functions and awareness: Secondary | ICD-10-CM | POA: Diagnosis present

## 2020-03-15 DIAGNOSIS — G930 Cerebral cysts: Secondary | ICD-10-CM | POA: Diagnosis present

## 2020-03-15 MED ORDER — GADOBUTROL 1 MMOL/ML IV SOLN
9.0000 mL | Freq: Once | INTRAVENOUS | Status: AC | PRN
  Administered 2020-03-15: 9 mL via INTRAVENOUS

## 2020-03-20 ENCOUNTER — Inpatient Hospital Stay
Admission: EM | Admit: 2020-03-20 | Discharge: 2020-03-24 | DRG: 189 | Discharge status: Against Medical Advice | Payer: Medicare Other | Attending: Internal Medicine | Admitting: Internal Medicine

## 2020-03-20 ENCOUNTER — Emergency Department: Payer: Medicare Other

## 2020-03-20 ENCOUNTER — Other Ambulatory Visit: Payer: Self-pay

## 2020-03-20 DIAGNOSIS — Z7189 Other specified counseling: Secondary | ICD-10-CM | POA: Diagnosis not present

## 2020-03-20 DIAGNOSIS — G894 Chronic pain syndrome: Secondary | ICD-10-CM | POA: Diagnosis present

## 2020-03-20 DIAGNOSIS — I130 Hypertensive heart and chronic kidney disease with heart failure and stage 1 through stage 4 chronic kidney disease, or unspecified chronic kidney disease: Secondary | ICD-10-CM | POA: Diagnosis present

## 2020-03-20 DIAGNOSIS — N179 Acute kidney failure, unspecified: Secondary | ICD-10-CM | POA: Diagnosis not present

## 2020-03-20 DIAGNOSIS — I509 Heart failure, unspecified: Secondary | ICD-10-CM | POA: Diagnosis not present

## 2020-03-20 DIAGNOSIS — J9621 Acute and chronic respiratory failure with hypoxia: Secondary | ICD-10-CM | POA: Diagnosis present

## 2020-03-20 DIAGNOSIS — R339 Retention of urine, unspecified: Secondary | ICD-10-CM | POA: Diagnosis not present

## 2020-03-20 DIAGNOSIS — Z9981 Dependence on supplemental oxygen: Secondary | ICD-10-CM

## 2020-03-20 DIAGNOSIS — E1122 Type 2 diabetes mellitus with diabetic chronic kidney disease: Secondary | ICD-10-CM | POA: Diagnosis present

## 2020-03-20 DIAGNOSIS — F1120 Opioid dependence, uncomplicated: Secondary | ICD-10-CM | POA: Diagnosis present

## 2020-03-20 DIAGNOSIS — Z7901 Long term (current) use of anticoagulants: Secondary | ICD-10-CM

## 2020-03-20 DIAGNOSIS — M1990 Unspecified osteoarthritis, unspecified site: Secondary | ICD-10-CM | POA: Diagnosis present

## 2020-03-20 DIAGNOSIS — F17210 Nicotine dependence, cigarettes, uncomplicated: Secondary | ICD-10-CM | POA: Diagnosis present

## 2020-03-20 DIAGNOSIS — J9622 Acute and chronic respiratory failure with hypercapnia: Secondary | ICD-10-CM | POA: Diagnosis present

## 2020-03-20 DIAGNOSIS — D509 Iron deficiency anemia, unspecified: Secondary | ICD-10-CM | POA: Diagnosis present

## 2020-03-20 DIAGNOSIS — Z765 Malingerer [conscious simulation]: Secondary | ICD-10-CM

## 2020-03-20 DIAGNOSIS — F419 Anxiety disorder, unspecified: Secondary | ICD-10-CM | POA: Diagnosis present

## 2020-03-20 DIAGNOSIS — F32A Depression, unspecified: Secondary | ICD-10-CM | POA: Diagnosis present

## 2020-03-20 DIAGNOSIS — Z720 Tobacco use: Secondary | ICD-10-CM | POA: Diagnosis not present

## 2020-03-20 DIAGNOSIS — J9602 Acute respiratory failure with hypercapnia: Secondary | ICD-10-CM

## 2020-03-20 DIAGNOSIS — I959 Hypotension, unspecified: Secondary | ICD-10-CM | POA: Diagnosis present

## 2020-03-20 DIAGNOSIS — E874 Mixed disorder of acid-base balance: Secondary | ICD-10-CM | POA: Diagnosis present

## 2020-03-20 DIAGNOSIS — M069 Rheumatoid arthritis, unspecified: Secondary | ICD-10-CM | POA: Diagnosis present

## 2020-03-20 DIAGNOSIS — I10 Essential (primary) hypertension: Secondary | ICD-10-CM | POA: Diagnosis present

## 2020-03-20 DIAGNOSIS — Z515 Encounter for palliative care: Secondary | ICD-10-CM | POA: Diagnosis not present

## 2020-03-20 DIAGNOSIS — E785 Hyperlipidemia, unspecified: Secondary | ICD-10-CM | POA: Diagnosis present

## 2020-03-20 DIAGNOSIS — I4892 Unspecified atrial flutter: Secondary | ICD-10-CM | POA: Diagnosis present

## 2020-03-20 DIAGNOSIS — Z5329 Procedure and treatment not carried out because of patient's decision for other reasons: Secondary | ICD-10-CM | POA: Diagnosis not present

## 2020-03-20 DIAGNOSIS — I2510 Atherosclerotic heart disease of native coronary artery without angina pectoris: Secondary | ICD-10-CM | POA: Diagnosis present

## 2020-03-20 DIAGNOSIS — Z79899 Other long term (current) drug therapy: Secondary | ICD-10-CM

## 2020-03-20 DIAGNOSIS — I5033 Acute on chronic diastolic (congestive) heart failure: Secondary | ICD-10-CM | POA: Diagnosis present

## 2020-03-20 DIAGNOSIS — R278 Other lack of coordination: Secondary | ICD-10-CM | POA: Diagnosis present

## 2020-03-20 DIAGNOSIS — R410 Disorientation, unspecified: Secondary | ICD-10-CM | POA: Diagnosis not present

## 2020-03-20 DIAGNOSIS — R262 Difficulty in walking, not elsewhere classified: Secondary | ICD-10-CM | POA: Diagnosis present

## 2020-03-20 DIAGNOSIS — Z8614 Personal history of Methicillin resistant Staphylococcus aureus infection: Secondary | ICD-10-CM

## 2020-03-20 DIAGNOSIS — Z7951 Long term (current) use of inhaled steroids: Secondary | ICD-10-CM

## 2020-03-20 DIAGNOSIS — I480 Paroxysmal atrial fibrillation: Secondary | ICD-10-CM | POA: Diagnosis present

## 2020-03-20 DIAGNOSIS — M545 Low back pain, unspecified: Secondary | ICD-10-CM | POA: Diagnosis present

## 2020-03-20 DIAGNOSIS — Z794 Long term (current) use of insulin: Secondary | ICD-10-CM

## 2020-03-20 DIAGNOSIS — T83021A Displacement of indwelling urethral catheter, initial encounter: Secondary | ICD-10-CM | POA: Diagnosis not present

## 2020-03-20 DIAGNOSIS — J9611 Chronic respiratory failure with hypoxia: Secondary | ICD-10-CM | POA: Diagnosis present

## 2020-03-20 DIAGNOSIS — Z20822 Contact with and (suspected) exposure to covid-19: Secondary | ICD-10-CM | POA: Diagnosis present

## 2020-03-20 DIAGNOSIS — I272 Pulmonary hypertension, unspecified: Secondary | ICD-10-CM | POA: Diagnosis not present

## 2020-03-20 DIAGNOSIS — J441 Chronic obstructive pulmonary disease with (acute) exacerbation: Secondary | ICD-10-CM | POA: Diagnosis present

## 2020-03-20 DIAGNOSIS — J9601 Acute respiratory failure with hypoxia: Secondary | ICD-10-CM

## 2020-03-20 DIAGNOSIS — N183 Chronic kidney disease, stage 3 unspecified: Secondary | ICD-10-CM | POA: Diagnosis present

## 2020-03-20 DIAGNOSIS — Z955 Presence of coronary angioplasty implant and graft: Secondary | ICD-10-CM

## 2020-03-20 DIAGNOSIS — Z79891 Long term (current) use of opiate analgesic: Secondary | ICD-10-CM

## 2020-03-20 LAB — LACTIC ACID, PLASMA
Lactic Acid, Venous: 0.9 mmol/L (ref 0.5–1.9)
Lactic Acid, Venous: 0.9 mmol/L (ref 0.5–1.9)
Lactic Acid, Venous: 1.4 mmol/L (ref 0.5–1.9)

## 2020-03-20 LAB — BLOOD GAS, VENOUS
Acid-Base Excess: 14.5 mmol/L — ABNORMAL HIGH (ref 0.0–2.0)
Bicarbonate: 45.6 mmol/L — ABNORMAL HIGH (ref 20.0–28.0)
O2 Saturation: 52.6 %
Patient temperature: 37
pCO2, Ven: 104 mmHg (ref 44.0–60.0)
pH, Ven: 7.25 (ref 7.250–7.430)
pO2, Ven: 33 mmHg (ref 32.0–45.0)

## 2020-03-20 LAB — COMPREHENSIVE METABOLIC PANEL
ALT: 11 U/L (ref 0–44)
AST: 14 U/L — ABNORMAL LOW (ref 15–41)
Albumin: 3.7 g/dL (ref 3.5–5.0)
Alkaline Phosphatase: 122 U/L (ref 38–126)
Anion gap: 6 (ref 5–15)
BUN: 19 mg/dL (ref 8–23)
CO2: 39 mmol/L — ABNORMAL HIGH (ref 22–32)
Calcium: 9.1 mg/dL (ref 8.9–10.3)
Chloride: 94 mmol/L — ABNORMAL LOW (ref 98–111)
Creatinine, Ser: 1.26 mg/dL — ABNORMAL HIGH (ref 0.61–1.24)
GFR, Estimated: >60 mL/min (ref >60)
Glucose, Bld: 100 mg/dL — ABNORMAL HIGH (ref 70–99)
Potassium: 4.7 mmol/L (ref 3.5–5.1)
Sodium: 139 mmol/L (ref 135–145)
Total Bilirubin: 0.5 mg/dL (ref 0.3–1.2)
Total Protein: 7 g/dL (ref 6.5–8.1)

## 2020-03-20 LAB — CBC WITH DIFFERENTIAL/PLATELET
Abs Immature Granulocytes: 0.04 10*3/uL (ref 0.00–0.07)
Basophils Absolute: 0 10*3/uL (ref 0.0–0.1)
Basophils Relative: 0 %
Eosinophils Absolute: 0 10*3/uL (ref 0.0–0.5)
Eosinophils Relative: 0 %
HCT: 38.1 % — ABNORMAL LOW (ref 39.0–52.0)
Hemoglobin: 10.9 g/dL — ABNORMAL LOW (ref 13.0–17.0)
Immature Granulocytes: 1 %
Lymphocytes Relative: 9 %
Lymphs Abs: 0.7 10*3/uL (ref 0.7–4.0)
MCH: 23.4 pg — ABNORMAL LOW (ref 26.0–34.0)
MCHC: 28.6 g/dL — ABNORMAL LOW (ref 30.0–36.0)
MCV: 81.8 fL (ref 80.0–100.0)
Monocytes Absolute: 0.9 10*3/uL (ref 0.1–1.0)
Monocytes Relative: 12 %
Neutro Abs: 6.3 10*3/uL (ref 1.7–7.7)
Neutrophils Relative %: 78 %
Platelets: 261 10*3/uL (ref 150–400)
RBC: 4.66 MIL/uL (ref 4.22–5.81)
RDW: 19.9 % — ABNORMAL HIGH (ref 11.5–15.5)
WBC: 8 10*3/uL (ref 4.0–10.5)
nRBC: 0 % (ref 0.0–0.2)

## 2020-03-20 LAB — BRAIN NATRIURETIC PEPTIDE
B Natriuretic Peptide: 138.6 pg/mL — ABNORMAL HIGH (ref 0.0–100.0)
B Natriuretic Peptide: 130.5 pg/mL — ABNORMAL HIGH (ref 0.0–100.0)

## 2020-03-20 LAB — URINALYSIS, COMPLETE (UACMP) WITH MICROSCOPIC
Bacteria, UA: NONE SEEN
Bilirubin Urine: NEGATIVE
Glucose, UA: NEGATIVE mg/dL
Hgb urine dipstick: NEGATIVE
Ketones, ur: NEGATIVE mg/dL
Leukocytes,Ua: NEGATIVE
Nitrite: NEGATIVE
Protein, ur: NEGATIVE mg/dL
Specific Gravity, Urine: 1.017 (ref 1.005–1.030)
Squamous Epithelial / HPF: NONE SEEN (ref 0–5)
pH: 5 (ref 5.0–8.0)

## 2020-03-20 LAB — POC SARS CORONAVIRUS 2 AG -  ED: SARS Coronavirus 2 Ag: NEGATIVE

## 2020-03-20 LAB — BLOOD GAS, ARTERIAL
Acid-Base Excess: 15.1 mmol/L — ABNORMAL HIGH (ref 0.0–2.0)
Allens test (pass/fail): POSITIVE — AB
Bicarbonate: 45.2 mmol/L — ABNORMAL HIGH (ref 20.0–28.0)
FIO2: 0.4
MECHVT: 500 mL
O2 Saturation: 84.4 %
Patient temperature: 37
RATE: 16 resp/min
pCO2 arterial: 94 mmHg (ref 32.0–48.0)
pH, Arterial: 7.29 — ABNORMAL LOW (ref 7.350–7.450)
pO2, Arterial: 55 mmHg — ABNORMAL LOW (ref 83.0–108.0)

## 2020-03-20 LAB — COOXEMETRY PANEL
Carboxyhemoglobin: 4.4 % — ABNORMAL HIGH (ref 0.5–1.5)
Methemoglobin: 0.2 % (ref 0.0–1.5)

## 2020-03-20 LAB — RESP PANEL BY RT-PCR (FLU A&B, COVID) ARPGX2
Influenza A by PCR: NEGATIVE
Influenza B by PCR: NEGATIVE
SARS Coronavirus 2 by RT PCR: NEGATIVE

## 2020-03-20 LAB — GLUCOSE, CAPILLARY: Glucose-Capillary: 183 mg/dL — ABNORMAL HIGH (ref 70–99)

## 2020-03-20 LAB — MRSA PCR SCREENING: MRSA by PCR: NEGATIVE

## 2020-03-20 LAB — TROPONIN I (HIGH SENSITIVITY)
Troponin I (High Sensitivity): 6 ng/L (ref <18)
Troponin I (High Sensitivity): 6 ng/L (ref <18)

## 2020-03-20 LAB — MAGNESIUM: Magnesium: 2.2 mg/dL (ref 1.7–2.4)

## 2020-03-20 LAB — PROCALCITONIN: Procalcitonin: 0.19 ng/mL

## 2020-03-20 MED ORDER — BACLOFEN 10 MG PO TABS
20.0000 mg | ORAL_TABLET | Freq: Three times a day (TID) | ORAL | Status: DC
  Administered 2020-03-21 (×2): 20 mg via ORAL
  Filled 2020-03-20 (×7): qty 2

## 2020-03-20 MED ORDER — METHYLPREDNISOLONE SODIUM SUCC 125 MG IJ SOLR
125.0000 mg | Freq: Once | INTRAMUSCULAR | Status: AC
  Administered 2020-03-20: 125 mg via INTRAVENOUS
  Filled 2020-03-20: qty 2

## 2020-03-20 MED ORDER — IPRATROPIUM-ALBUTEROL 0.5-2.5 (3) MG/3ML IN SOLN
3.0000 mL | Freq: Once | RESPIRATORY_TRACT | Status: AC
  Administered 2020-03-20: 3 mL via RESPIRATORY_TRACT
  Filled 2020-03-20: qty 3

## 2020-03-20 MED ORDER — POLYETHYLENE GLYCOL 3350 17 G PO PACK
17.0000 g | PACK | Freq: Every day | ORAL | Status: DC | PRN
Start: 2020-03-20 — End: 2020-03-24

## 2020-03-20 MED ORDER — DOCUSATE SODIUM 100 MG PO CAPS: 100.0000 mg | ORAL_CAPSULE | Freq: Two times a day (BID) | ORAL | Status: DC | PRN

## 2020-03-20 MED ORDER — HYDROMORPHONE HCL 1 MG/ML IJ SOLN
0.5000 mg | INTRAMUSCULAR | Status: DC | PRN
  Administered 2020-03-20 – 2020-03-21 (×5): 0.5 mg via INTRAVENOUS
  Filled 2020-03-20 (×5): qty 1

## 2020-03-20 MED ORDER — IPRATROPIUM-ALBUTEROL 0.5-2.5 (3) MG/3ML IN SOLN
9.0000 mL | Freq: Once | RESPIRATORY_TRACT | Status: AC
  Administered 2020-03-20: 9 mL via RESPIRATORY_TRACT
  Filled 2020-03-20: qty 9

## 2020-03-20 MED ORDER — RIVAROXABAN 10 MG PO TABS
10.0000 mg | ORAL_TABLET | Freq: Every day | ORAL | Status: DC
  Administered 2020-03-21: 10 mg via ORAL
  Filled 2020-03-20 (×2): qty 1

## 2020-03-20 MED ORDER — PANTOPRAZOLE SODIUM 40 MG PO TBEC
40.0000 mg | DELAYED_RELEASE_TABLET | Freq: Every day | ORAL | Status: DC
  Administered 2020-03-21 – 2020-03-24 (×4): 40 mg via ORAL
  Filled 2020-03-20 (×4): qty 1

## 2020-03-20 MED ORDER — SODIUM CHLORIDE 0.9 % IV SOLN
250.0000 mL | INTRAVENOUS | Status: DC | PRN
Start: 2020-03-20 — End: 2020-03-24

## 2020-03-20 MED ORDER — SODIUM CHLORIDE 0.9 % IV SOLN
500.0000 mg | Freq: Once | INTRAVENOUS | Status: AC
  Administered 2020-03-20: 500 mg via INTRAVENOUS
  Filled 2020-03-20: qty 500

## 2020-03-20 MED ORDER — CHLORHEXIDINE GLUCONATE CLOTH 2 % EX PADS: 6.0000 | MEDICATED_PAD | Freq: Every day | CUTANEOUS | Status: DC

## 2020-03-20 MED ORDER — IPRATROPIUM-ALBUTEROL 0.5-2.5 (3) MG/3ML IN SOLN
3.0000 mL | RESPIRATORY_TRACT | Status: DC
  Administered 2020-03-20 – 2020-03-22 (×10): 3 mL via RESPIRATORY_TRACT
  Filled 2020-03-20 (×10): qty 3

## 2020-03-20 MED ORDER — SODIUM CHLORIDE 0.9% FLUSH
3.0000 mL | Freq: Two times a day (BID) | INTRAVENOUS | Status: DC
  Administered 2020-03-20 – 2020-03-24 (×10): 3 mL via INTRAVENOUS

## 2020-03-20 MED ORDER — SODIUM CHLORIDE 0.9% FLUSH
3.0000 mL | INTRAVENOUS | Status: DC | PRN
Start: 2020-03-20 — End: 2020-03-24

## 2020-03-20 MED ORDER — OXYCODONE HCL 5 MG PO TABS
10.0000 mg | ORAL_TABLET | Freq: Three times a day (TID) | ORAL | Status: DC | PRN
  Administered 2020-03-20 – 2020-03-21 (×2): 10 mg via ORAL
  Filled 2020-03-20 (×2): qty 2

## 2020-03-20 MED ORDER — SODIUM CHLORIDE 0.9 % IV SOLN
1.0000 g | Freq: Once | INTRAVENOUS | Status: AC
  Administered 2020-03-20: 1 g via INTRAVENOUS
  Filled 2020-03-20: qty 10

## 2020-03-20 MED ORDER — ONDANSETRON HCL 4 MG/2ML IJ SOLN: 4.0000 mg | Freq: Four times a day (QID) | INTRAMUSCULAR | Status: DC | PRN

## 2020-03-20 MED ORDER — METHYLPREDNISOLONE SODIUM SUCC 125 MG IJ SOLR
80.0000 mg | Freq: Two times a day (BID) | INTRAMUSCULAR | Status: DC
  Administered 2020-03-20 – 2020-03-21 (×2): 80 mg via INTRAVENOUS
  Filled 2020-03-20 (×2): qty 2

## 2020-03-20 MED ORDER — FUROSEMIDE 10 MG/ML IJ SOLN
80.0000 mg | Freq: Once | INTRAMUSCULAR | Status: AC
  Administered 2020-03-20: 80 mg via INTRAVENOUS
  Filled 2020-03-20: qty 8

## 2020-03-20 MED ORDER — INSULIN ASPART 100 UNIT/ML ~~LOC~~ SOLN
0.0000 [IU] | SUBCUTANEOUS | Status: DC
  Administered 2020-03-21: 4 [IU] via SUBCUTANEOUS
  Administered 2020-03-21 (×2): 3 [IU] via SUBCUTANEOUS
  Administered 2020-03-21: 4 [IU] via SUBCUTANEOUS
  Filled 2020-03-20 (×4): qty 1
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
## 2020-03-20 NOTE — ED Notes (Signed)
Patient requesting pain medication and is increasingly restless.  Georgetta Haber, MD  Messaged via secure chat to request IV medication for patient.

## 2020-03-20 NOTE — ED Provider Notes (Signed)
[facility] Emergency Department Provider Note  ____________________________________________   Event Date/Time   First MD Initiated Contact with Patient 03/20/20 1129     (approximate)  I have reviewed the triage vital signs and the nursing notes.   HISTORY  Chief Complaint Shortness of Breath and Leg Swelling   HPI Gregory Hall is a 65 y.o. male with a past medical history of COPD, CHF, chronic pain, DM, HTN, CAD, depression and tobacco abuse who presents for assessment of shortness of breath and worsening leg swelling as well as decreased urine output today.  He states he thinks started feeling short of breath yesterday.  He denies any headache, earache, sore throat, fevers, cough, chest pain abdominal pain, acute neck or back pain or diarrhea.  States he has chronic pain in his lower back is not any different than usual.  He denies any recent injuries or falls.  States he has not been able to urinate today.  He thinks he has new swelling in his legs and has been taking all his medications as directed.  Denies any other acute concerns at this time.  Denies any EtOH use.          Past Medical History:  Diagnosis Date  . AKI (acute kidney injury) (HCC) 09/12/2019  . CAD (coronary artery disease)    s/p PTCA and stent x2  . Chest pain   . CHF (congestive heart failure) (HCC)   . Chronic pain syndrome   . COPD (chronic obstructive pulmonary disease) (HCC)   . Degenerative cervical disc   . Depression   . Diabetes mellitus without complication (HCC)   . Dyslipidemia   . GERD (gastroesophageal reflux disease)   . Hernia 2014  . Hypertension   . MRSA (methicillin resistant staph aureus) culture positive 2011  . Neuropathy   . Nutcracker esophagus   . Rectus diastasis 07/19/2012    Patient Active Problem List   Diagnosis Date Noted  . AKI (acute kidney injury) (HCC) 02/17/2020  . Diabetes mellitus without complication (HCC)   . Diastolic  dysfunction with chronic heart failure (HCC)   . Near syncope   . Acute respiratory failure (HCC) 12/13/2019  . Palliative care by specialist   . DNR (do not resuscitate)   . Acute CHF (congestive heart failure) (HCC) 10/08/2019  . Chronic respiratory failure with hypoxia (HCC) 10/08/2019  . Atrial flutter (HCC)   . Acute on chronic respiratory failure with hypoxia (HCC) 09/13/2019  . Acute diastolic CHF (congestive heart failure) (HCC) 09/12/2019  . NSTEMI (non-ST elevated myocardial infarction) (HCC) 09/12/2019  . Acute kidney failure, unspecified (HCC) 09/12/2019  . Diabetes mellitus type 2, uncontrolled, with complications (HCC) 06/10/2019  . Chronic pain syndrome 06/10/2019  . COPD exacerbation (HCC) 06/10/2019  . COPD with acute exacerbation (HCC) 06/09/2019  . CAD (coronary artery disease) 06/09/2019  . HTN (hypertension) 06/09/2019  . Acute on chronic respiratory failure with hypoxia and hypercapnia (HCC) 06/09/2019  . Anxiety 06/09/2019  . Chronic prescription opiate use 06/09/2019  . Hyperglycemia 06/09/2019  . Subclavian artery stenosis, left (HCC) 08/20/2014  . Rectus diastasis 07/19/2012  . Hernia   . Edema 05/10/2011  . Goals of care, counseling/discussion 10/06/2010  . SMOKER 11/25/2009  . CAROTID BRUIT, RIGHT 11/24/2009  . Chest pain 11/24/2009  . Hyperlipidemia 03/30/2009  . Coronary atherosclerosis 03/30/2009  . HYPERTENSION, HX OF 03/30/2009    Past Surgical History:  Procedure Laterality Date  . BACK SURGERY  2012  .  CARDIOVERSION N/A 09/26/2019   Procedure: CARDIOVERSION;  Surgeon: Antonieta Iba, MD;  Location: [facility] ORS;  Service: Cardiovascular;  Laterality: N/A;  . CHOLECYSTECTOMY    . COLONOSCOPY  Jan 2014   [name]  . CORONARY ANGIOPLASTY WITH STENT PLACEMENT  2009   stents x2, in [place], [place]  . FOOT SURGERY     Right  . NECK SURGERY    . SPINE SURGERY  2012,2013  . TONSILLECTOMY      Prior to Admission medications   Medication Sig  Start Date End Date Taking? Authorizing Provider  amiodarone (PACERONE) 200 MG tablet Take 1 tablet (200 mg total) by mouth daily. 11/03/19   Marisue Ivan D, PA-C  atorvastatin (LIPITOR) 40 MG tablet Take 1 tablet (40 mg total) by mouth daily. 09/28/19   Lewie Chamber, MD  baclofen (LIORESAL) 20 MG tablet Take 20 mg by mouth 3 (three) times daily.    [provider]  budesonide (PULMICORT) 0.5 MG/2ML nebulizer solution Take 0.5 mg by nebulization 2 (two) times daily.    [provider]  Cholecalciferol (VITAMIN D3) 50 MCG (2000 UT) TABS Take 2,000 Units by mouth daily.     [provider]  DULoxetine (CYMBALTA) 60 MG capsule Take 60 mg by mouth daily.    [provider]  Ensure Max Protein (ENSURE MAX PROTEIN) LIQD Take 330 mLs (11 oz total) by mouth 2 (two) times daily between meals. 06/14/19   Sheikh, Omair Latif, DO  fexofenadine (ALLEGRA) 180 MG tablet Take 180 mg by mouth daily. 01/23/20   [provider]  fluticasone (FLONASE) 50 MCG/ACT nasal spray Place 1 spray into both nostrils 2 (two) times daily. 12/15/19   Pennie Banter, DO  Fluticasone-Umeclidin-Vilant (TRELEGY ELLIPTA) 100-62.5-25 MCG/INH AEPB Inhale 1 puff into the lungs daily.  10/20/19   [provider]  glyBURIDE (DIABETA) 2.5 MG tablet Take 2.5 mg by mouth daily. 01/01/20   [provider]  glycopyrrolate (ROBINUL) 1 MG tablet Take 1 tablet (1 mg total) by mouth 3 (three) times daily. 02/21/20   Arnetha Courser, MD  guaiFENesin (MUCINEX) 600 MG 12 hr tablet Take 1 tablet (600 mg total) by mouth 2 (two) times daily. 02/21/20   Arnetha Courser, MD  insulin glargine (LANTUS) 100 UNIT/ML injection Inject 11 Units into the skin at bedtime.     [provider]  insulin lispro (HUMALOG) 100 UNIT/ML injection Inject 4 Units into the skin See admin instructions. Inject 4 units with blood sugar 150-250; inject 6 units 251-350; greater than 351 call provider; Hold for FSBG  <150    [provider]  ipratropium-albuterol (DUONEB) 0.5-2.5 (3) MG/3ML SOLN Take 3 mLs by nebulization every 6 (six) hours as needed. Patient taking differently: Take 3 mLs by nebulization every 6 (six) hours as needed (wheezing). 06/14/19   Marguerita Merles Latif, DO  lamoTRIgine (LAMICTAL) 100 MG tablet Take 100 mg by mouth daily.    [provider]  magnesium oxide (MAG-OX) 400 (241.3 Mg) MG tablet Take 1 tablet by mouth daily. 12/24/19   [provider]  midodrine (PROAMATINE) 10 MG tablet Take 1 tablet (10 mg total) by mouth 2 (two) times daily. Take if top blood pressure reading is less than 100 02/16/20   Dunn, Ryan M, PA-C  NAC 600 MG CAPS Take 600 mg by mouth daily. 09/08/19   [provider]  NITROSTAT 0.4 MG SL tablet DISSOLVE (1) TABLET UNDER TONGUE AS NEEDED TO RELIEVE CHEST PAIN. MAYREPEAT EVERY 5  MINUTES. Patient taking differently: Place 0.4 mg under the tongue every 5 (five) minutes as needed for chest pain. 10/06/15   Iran Ouch, MD  ondansetron (ZOFRAN-ODT) 4 MG disintegrating tablet Take 1 tablet (4 mg total) by mouth every 8 (eight) hours as needed for nausea or vomiting. 02/21/20   Arnetha Courser, MD  Oxycodone HCl 10 MG TABS Take 1 tablet (10 mg total) by mouth 3 (three) times daily. Patient taking differently: Take 10 mg by mouth every 6 (six) hours. 09/14/19   Danford, Earl Lites, MD  oxymetazoline (AFRIN) 0.05 % nasal spray Place 2 sprays into both nostrils 2 (two) times daily as needed for congestion.    [provider]  pantoprazole (PROTONIX) 40 MG tablet Take 40 mg by mouth daily.     [provider]  predniSONE (DELTASONE) 2.5 MG tablet Take by mouth. 01/21/20   [provider]  predniSONE (DELTASONE) 50 MG tablet Take 1 tablet daily for 5 days 02/21/20   Arnetha Courser, MD  pregabalin (LYRICA) 200 MG capsule Take 1 capsule (200 mg total) by mouth 3 (three) times daily. 09/14/19   Danford, Earl Lites, MD   rivaroxaban (XARELTO) 20 MG TABS tablet Take 20 mg by mouth daily with supper.    [provider]  roflumilast (DALIRESP) 500 MCG TABS tablet Take 0.5 tablets (250 mcg total) by mouth daily. 02/21/20   Arnetha Courser, MD  senna-docusate (SENOKOT-S) 8.6-50 MG tablet Take 1 tablet by mouth daily.     [provider]  sodium chloride (OCEAN) 0.65 % SOLN nasal spray Place 1 spray into both nostrils as needed for congestion. 12/15/19   Pennie Banter, DO  tamsulosin (FLOMAX) 0.4 MG CAPS capsule Take 1 capsule (0.4 mg total) by mouth daily after supper. 10/10/19   Lynn Ito, MD  torsemide (DEMADEX) 20 MG tablet Take 2 tablets (40 mg total) by mouth daily. 01/21/20 04/20/20  Delma Freeze, FNP  traZODone (DESYREL) 150 MG tablet Take 150 mg by mouth at bedtime. 01/23/20   [provider]  Ubrogepant (UBRELVY) 100 MG TABS Take 100 mg by mouth daily. 02/22/20   Arnetha Courser, MD    Allergies Acetaminophen, Fentanyl, and Nsaids  Family History  Family history unknown: Yes    Social History Social History   Tobacco Use  . Smoking status: Current Every Day Smoker    Packs/day: 0.50    Years: 30.00    Pack years: 15.00    Types: Cigarettes    Last attempt to quit: 11/26/2019    Years since quitting: 0.3  . Smokeless tobacco: Never Used  Substance Use Topics  . Alcohol use: No  . Drug use: No    Review of Systems  Review of Systems  Constitutional: Negative for chills and fever.  HENT: Negative for sore throat.   Eyes: Negative for pain.  Respiratory: Positive for shortness of breath. Negative for cough and stridor.   Cardiovascular: Negative for chest pain.  Gastrointestinal: Negative for vomiting.  Genitourinary:       "cant pee today, urinated normally yesterday  Musculoskeletal: Positive for back pain ( chronic) and neck pain ( chronic).  Skin: Negative for rash.  Neurological: Negative for seizures, loss of consciousness and headaches.   Psychiatric/Behavioral: Negative for suicidal ideas.  All other systems reviewed and are negative.     ____________________________________________   PHYSICAL EXAM:  VITAL SIGNS: ED Triage Vitals  Enc Vitals Group     BP  Pulse      Resp      Temp      Temp src      SpO2      Weight      Height      Head Circumference      Peak Flow      Pain Score      Pain Loc      Pain Edu?      Excl. in GC?    Vitals:   03/20/20 1356 03/20/20 1400  BP: (!) 138/49 (!) 128/56  Pulse: 90 89  Resp: 15 15  Temp:    SpO2: 94% 95%   Physical Exam Vitals and nursing note reviewed.  Constitutional:      Appearance: He is well-developed. He is ill-appearing.  HENT:     Head: Normocephalic and atraumatic.     Right Ear: External ear normal.     Left Ear: External ear normal.     Nose: Nose normal.  Eyes:     Conjunctiva/sclera: Conjunctivae normal.  Cardiovascular:     Rate and Rhythm: Normal rate and regular rhythm.     Heart sounds: No murmur heard.   Pulmonary:     Effort: Respiratory distress present.     Breath sounds: Decreased breath sounds present.  Abdominal:     General: There is distension.     Palpations: Abdomen is soft.     Tenderness: There is no abdominal tenderness.  Musculoskeletal:     Cervical back: Neck supple.     Right lower leg: Edema present.     Left lower leg: Edema present.  Skin:    General: Skin is warm and dry.  Neurological:     Mental Status: He is alert and oriented to person, place, and time.  Psychiatric:        Mood and Affect: Mood normal.      ____________________________________________   LABS (all labs ordered are listed, but only abnormal results are displayed)  Labs Reviewed  CBC WITH DIFFERENTIAL/PLATELET - Abnormal; Notable for the following components:      Result Value   Hemoglobin 10.9 (*)    HCT 38.1 (*)    MCH 23.4 (*)    MCHC 28.6 (*)    RDW 19.9 (*)    All other components within normal limits   COMPREHENSIVE METABOLIC PANEL - Abnormal; Notable for the following components:   Chloride 94 (*)    CO2 39 (*)    Glucose, Bld 100 (*)    Creatinine, Ser 1.26 (*)    AST 14 (*)    All other components within normal limits  BRAIN NATRIURETIC PEPTIDE - Abnormal; Notable for the following components:   B Natriuretic Peptide 138.6 (*)    All other components within normal limits  URINALYSIS, COMPLETE (UACMP) WITH MICROSCOPIC - Abnormal; Notable for the following components:   Color, Urine YELLOW (*)    APPearance CLEAR (*)    All other components within normal limits  BLOOD GAS, VENOUS - Abnormal; Notable for the following components:   pCO2, Ven 104 (*)    Bicarbonate 45.6 (*)    Acid-Base Excess 14.5 (*)    All other components within normal limits  CULTURE, BLOOD (ROUTINE X 2)  CULTURE, BLOOD (ROUTINE X 2)  PROCALCITONIN  LACTIC ACID, PLASMA  LACTIC ACID, PLASMA  TROPONIN I (HIGH SENSITIVITY)  TROPONIN I (HIGH SENSITIVITY)   ____________________________________________  EKG  ECG shows sinus rhythm with a ventricular rate  of 84, borderline left axis deviation, unremarkable intervals and nonspecific ST change in lead III, V4, and V6 without other clear evidence of acute ischemia or other significant underlying arrhythmia. ____________________________________________  RADIOLOGY  ED MD interpretation: Bilateral interstitial opacities most consistent with edema without clear for consolidation, effusion, or pneumothorax.   Official radiology report(s): DG Chest 1 View  Result Date: 03/20/2020 CLINICAL DATA:  Shortness of breath EXAM: CHEST  1 VIEW COMPARISON:  02/19/2020 FINDINGS: The heart size and mediastinal contours are within normal limits. Mild, diffuse bilateral interstitial opacity. Bandlike scarring or atelectasis of the bilateral lung bases. The visualized skeletal structures are unremarkable. IMPRESSION: 1. Mild, diffuse bilateral interstitial opacity, most consistent  with mild edema. 2. Bandlike scarring or atelectasis of the bilateral lung bases. Electronically Signed   By: Lauralyn Primes M.D.   On: 03/20/2020 11:52    ____________________________________________   PROCEDURES  Procedure(s) performed (including Critical Care):  .Critical Care Performed by: Gilles Chiquito, MD Authorized by: Gilles Chiquito, MD   Critical care provider statement:    Critical care time (minutes):  45   Critical care time was exclusive of:  Separately billable procedures and treating other patients   Critical care was necessary to treat or prevent imminent or life-threatening deterioration of the following conditions:  Respiratory failure   Critical care was time spent personally by me on the following activities:  Discussions with consultants, evaluation of patient's response to treatment, examination of patient, ordering and performing treatments and interventions, ordering and review of laboratory studies, ordering and review of radiographic studies, pulse oximetry, re-evaluation of patient's condition, obtaining history from patient or surrogate and review of old charts     ____________________________________________   INITIAL IMPRESSION / ASSESSMENT AND PLAN / ED COURSE      Patient presents with above to history exam for assessment of worsening shortness of breath over the last 2 days associate with some inability to urinate and swelling in his legs.  On arrival he is hemodynamically stable on a nonrebreather.  However on trial of 4 L nasal cannula he did drop his SPO2 down to low 80s.  This improved after nonrebreather was replaced back to the mid 90s.  Primary differential includes acute heart failure exacerbation, COPD exacerbation, pneumonia, ACS, arrhythmia, anemia, metabolic derangements possible kidney failure.  Unclear if patient is retaining secondary to post outlet obstruction.  Foley placed given concern patient was not safe to ambulate he refused  using a urinal.  Low suspicion for PE as patient is anticoagulated on Xarelto.  Chest x-ray obtained shows some bilateral patchiness consistent with early pulmonary edema.  No clear focal consolidation, large effusion or pneumothorax.  VBG unremarkable remarkable for evidence of acute hypercarbic respiratory failure with a pH of 7.25 and a PCO2 of 104 with bicarb of 45.6.  Troponin is not elevated at 6 and overall low suspicion for ACS at this time.  Over given presentation we will plan to trend this x1.  CBC shows no leukocytosis or acute anemia.  Hemoglobin is at baseline at 10.9 compared to 9.1.  CMP remarkable for bicarb of 39 and no other significant electrolyte or metabolic derangements.  Procalcitonin is slightly elevated at 0.19.  BNP slightly elevated at 138 although I suspect patient is slightly volume overloaded given her rales associated with wheezing and significant edema on exam.  Wheezing is concerning in the setting of hypercarbic and hypoxic respiratory failure for COPD exacerbation.  In addition to Lasix patient was treated  with duo nebs and steroids and placed on BiPAP.  Lactic acid nonelevated 0.9.  Low suspicion for sepsis at this time.  Patient was given Rocephin and Cipro for her COPD exacerbation.  Given degree of hypoxia and hypercarbia he will be admitted to medical ICU service for further evaluation and management.    ____________________________________________   FINAL CLINICAL IMPRESSION(S) / ED DIAGNOSES  Final diagnoses:  Acute respiratory failure with hypoxia and hypercapnia (HCC)    Medications  baclofen (LIORESAL) tablet 20 mg (has no administration in time range)  oxyCODONE (Oxy IR/ROXICODONE) immediate release tablet 10 mg (has no administration in time range)  azithromycin (ZITHROMAX) 500 mg in sodium chloride 0.9 % 250 mL IVPB (500 mg Intravenous New Bag/Given 03/20/20 1352)  ipratropium-albuterol (DUONEB) 0.5-2.5 (3) MG/3ML nebulizer solution 3 mL (3 mLs  Nebulization Given 03/20/20 1152)  ipratropium-albuterol (DUONEB) 0.5-2.5 (3) MG/3ML nebulizer solution 9 mL (9 mLs Nebulization Given 03/20/20 1255)  methylPREDNISolone sodium succinate (SOLU-MEDROL) 125 mg/2 mL injection 125 mg (125 mg Intravenous Given 03/20/20 1255)  cefTRIAXone (ROCEPHIN) 1 g in sodium chloride 0.9 % 100 mL IVPB (0 g Intravenous Stopped 03/20/20 1416)  furosemide (LASIX) injection 80 mg (80 mg Intravenous Given 03/20/20 1347)     ED Discharge Orders    None       Note:  This document was prepared using Dragon voice recognition software and may include unintentional dictation errors.   Gilles Chiquito, MD 03/20/20 [phone]

## 2020-03-20 NOTE — ED Triage Notes (Signed)
Pt arrives to ER via [facility] from [facility] for SOB and leg swelling. Upon arrival appears lethargic. On 10L non re breather. Will open eyes when talked to. 64% on RA initially. Pt states he hasn't urinated since last night. Also c/o chronic back and neck pain.
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
## 2020-03-21 ENCOUNTER — Inpatient Hospital Stay: Payer: Medicare Other

## 2020-03-21 ENCOUNTER — Encounter: Payer: Self-pay | Admitting: Pulmonary Disease

## 2020-03-21 ENCOUNTER — Inpatient Hospital Stay (HOSPITAL_COMMUNITY)
Admit: 2020-03-21 | Discharge: 2020-03-21 | Disposition: A | Discharge status: Home / Self Care | Payer: Medicare Other | Attending: Pulmonary Disease | Admitting: Pulmonary Disease

## 2020-03-21 DIAGNOSIS — I272 Pulmonary hypertension, unspecified: Secondary | ICD-10-CM

## 2020-03-21 DIAGNOSIS — J441 Chronic obstructive pulmonary disease with (acute) exacerbation: Secondary | ICD-10-CM

## 2020-03-21 LAB — ECHOCARDIOGRAM COMPLETE
AR max vel: 2.01 cm2
AV Peak grad: 7.1 mmHg
Ao pk vel: 1.33 m/s
Area-P 1/2: 3.34 cm2
Height: 72 in
S' Lateral: 2.99 cm
Weight: 3322.77 oz

## 2020-03-21 LAB — GLUCOSE, CAPILLARY
Glucose-Capillary: 175 mg/dL — ABNORMAL HIGH (ref 70–99)
Glucose-Capillary: 153 mg/dL — ABNORMAL HIGH (ref 70–99)
Glucose-Capillary: 147 mg/dL — ABNORMAL HIGH (ref 70–99)
Glucose-Capillary: 96 mg/dL (ref 70–99)

## 2020-03-21 LAB — BASIC METABOLIC PANEL
Anion gap: 9 (ref 5–15)
BUN: 23 mg/dL (ref 8–23)
CO2: 39 mmol/L — ABNORMAL HIGH (ref 22–32)
Calcium: 8.9 mg/dL (ref 8.9–10.3)
Chloride: 91 mmol/L — ABNORMAL LOW (ref 98–111)
Creatinine, Ser: 0.94 mg/dL (ref 0.61–1.24)
GFR, Estimated: >60 mL/min (ref >60)
Glucose, Bld: 176 mg/dL — ABNORMAL HIGH (ref 70–99)
Potassium: 4.5 mmol/L (ref 3.5–5.1)
Sodium: 139 mmol/L (ref 135–145)

## 2020-03-21 LAB — CBC
HCT: 34.8 % — ABNORMAL LOW (ref 39.0–52.0)
Hemoglobin: 10.3 g/dL — ABNORMAL LOW (ref 13.0–17.0)
MCH: 23.4 pg — ABNORMAL LOW (ref 26.0–34.0)
MCHC: 29.6 g/dL — ABNORMAL LOW (ref 30.0–36.0)
MCV: 79.1 fL — ABNORMAL LOW (ref 80.0–100.0)
Platelets: 282 10*3/uL (ref 150–400)
RBC: 4.4 MIL/uL (ref 4.22–5.81)
RDW: 20.2 % — ABNORMAL HIGH (ref 11.5–15.5)
WBC: 4.1 10*3/uL (ref 4.0–10.5)
nRBC: 0 % (ref 0.0–0.2)

## 2020-03-21 LAB — HEMOGLOBIN A1C
Hgb A1c MFr Bld: 7.8 % — ABNORMAL HIGH (ref 4.8–5.6)
Mean Plasma Glucose: 177.16 mg/dL

## 2020-03-21 LAB — MAGNESIUM: Magnesium: 2.4 mg/dL (ref 1.7–2.4)

## 2020-03-21 LAB — PHOSPHORUS: Phosphorus: 2.5 mg/dL (ref 2.5–4.6)

## 2020-03-21 MED ORDER — TORSEMIDE 20 MG PO TABS
40.0000 mg | ORAL_TABLET | Freq: Every day | ORAL | Status: DC
  Administered 2020-03-21: 40 mg via ORAL
  Filled 2020-03-21: qty 2

## 2020-03-21 MED ORDER — AMIODARONE HCL 200 MG PO TABS
200.0000 mg | ORAL_TABLET | Freq: Every day | ORAL | Status: DC
  Administered 2020-03-21 – 2020-03-24 (×4): 200 mg via ORAL
  Filled 2020-03-21 (×4): qty 1

## 2020-03-21 MED ORDER — OXYCODONE HCL 5 MG PO TABS
10.0000 mg | ORAL_TABLET | Freq: Four times a day (QID) | ORAL | Status: DC | PRN
  Administered 2020-03-21 (×2): 10 mg via ORAL
  Filled 2020-03-21 (×2): qty 2

## 2020-03-21 MED ORDER — PERFLUTREN LIPID MICROSPHERE
1.0000 mL | INTRAVENOUS | Status: AC | PRN
  Administered 2020-03-21: 5 mL via INTRAVENOUS
  Filled 2020-03-21: qty 10

## 2020-03-21 MED ORDER — INSULIN GLARGINE 100 UNIT/ML ~~LOC~~ SOLN
11.0000 [IU] | Freq: Every day | SUBCUTANEOUS | Status: DC
  Administered 2020-03-22 – 2020-03-23 (×2): 11 [IU] via SUBCUTANEOUS
  Filled 2020-03-21 (×4): qty 0.11

## 2020-03-21 MED ORDER — DULOXETINE HCL 30 MG PO CPEP
60.0000 mg | ORAL_CAPSULE | Freq: Every day | ORAL | Status: DC
  Administered 2020-03-21: 60 mg via ORAL
  Filled 2020-03-21 (×2): qty 2

## 2020-03-21 MED ORDER — TAMSULOSIN HCL 0.4 MG PO CAPS
0.4000 mg | ORAL_CAPSULE | Freq: Every day | ORAL | Status: DC
  Administered 2020-03-22 – 2020-03-23 (×2): 0.4 mg via ORAL
  Filled 2020-03-21 (×3): qty 1

## 2020-03-21 MED ORDER — TRAZODONE HCL 50 MG PO TABS
150.0000 mg | ORAL_TABLET | Freq: Every day | ORAL | Status: DC
  Administered 2020-03-21 – 2020-03-23 (×3): 150 mg via ORAL
  Filled 2020-03-21 (×3): qty 1

## 2020-03-21 MED ORDER — LAMOTRIGINE 100 MG PO TABS
100.0000 mg | ORAL_TABLET | Freq: Every day | ORAL | Status: DC
  Administered 2020-03-21 – 2020-03-24 (×4): 100 mg via ORAL
  Filled 2020-03-21: qty 4
  Filled 2020-03-21 (×3): qty 1

## 2020-03-21 MED ORDER — HYDROMORPHONE HCL 1 MG/ML IJ SOLN: 0.5000 mg | INTRAMUSCULAR | Status: DC | PRN

## 2020-03-21 MED ORDER — METHYLPREDNISOLONE SODIUM SUCC 40 MG IJ SOLR
40.0000 mg | Freq: Two times a day (BID) | INTRAMUSCULAR | Status: DC
  Administered 2020-03-21 – 2020-03-24 (×6): 40 mg via INTRAVENOUS
  Filled 2020-03-21 (×6): qty 1

## 2020-03-21 MED ORDER — ATORVASTATIN CALCIUM 20 MG PO TABS
40.0000 mg | ORAL_TABLET | Freq: Every day | ORAL | Status: DC
  Administered 2020-03-21 – 2020-03-24 (×4): 40 mg via ORAL
  Filled 2020-03-21 (×4): qty 2

## 2020-03-21 MED ORDER — RIVAROXABAN 20 MG PO TABS: 20.0000 mg | ORAL_TABLET | Freq: Every day | ORAL | Status: DC

## 2020-03-21 MED ORDER — INSULIN ASPART 100 UNIT/ML ~~LOC~~ SOLN
0.0000 [IU] | Freq: Three times a day (TID) | SUBCUTANEOUS | Status: DC
  Administered 2020-03-21: 2 [IU] via SUBCUTANEOUS
  Administered 2020-03-22: 3 [IU] via SUBCUTANEOUS
  Administered 2020-03-22: 5 [IU] via SUBCUTANEOUS
  Administered 2020-03-22: 3 [IU] via SUBCUTANEOUS
  Administered 2020-03-23: 2 [IU] via SUBCUTANEOUS
  Administered 2020-03-23: 5 [IU] via SUBCUTANEOUS
  Administered 2020-03-23: 2 [IU] via SUBCUTANEOUS
  Administered 2020-03-24 (×2): 3 [IU] via SUBCUTANEOUS
  Filled 2020-03-21 (×9): qty 1

## 2020-03-21 NOTE — Progress Notes (Signed)
Pt refusing to use urinal, external catheters, and bedpan. Also refusing to allow staff to bladder scan him. MD Apolinar Junes aware. If no output by 2300, will place 18 Fr coude catheter per MD Apolinar Junes.   Lamonte Richer, RN

## 2020-03-21 NOTE — Progress Notes (Signed)
AVAP VT  500

## 2020-03-21 NOTE — Progress Notes (Signed)
*  PRELIMINARY RESULTS* Echocardiogram 2D Echocardiogram has been performed. Definity IV Contrast used on this study.  Gregory Hall Gregory Hall 03/21/2020, 7:28 AM

## 2020-03-21 NOTE — Progress Notes (Signed)
Follow up - Critical Care Medicine Note  Patient Details:    Gregory Hall is an 65 y.o. male  current smoker (half pack per day) with known GOLD stage IV COPD and chronic respiratory failure with hypoxia and hypercarbia on 3 to 4 L of oxygen who presented with the complaint of weakness and inability to void.  He became lethargic and obtunded in the emergency room and had to be placed on BiPAP.  Initial PCO2 is 104.  Patient placed on noninvasive ventilation, PCCM asked to admit.  Lines, Airways, Drains: Urethral Catheter Jae Dire RN  Latex 16 Fr. (Active)  Indication for Insertion or Continuance of Catheter Unstable critically ill patients first 24-48 hours (See Criteria) 03/21/20 0800  Site Assessment Clean;Intact;Dry 03/21/20 0800  Catheter Maintenance Bag below level of bladder;Catheter secured;Drainage bag/tubing not touching floor;Insertion date on drainage bag;No dependent loops;Seal intact;Bag emptied prior to transport 03/21/20 0800  Collection Container Standard drainage bag 03/21/20 0800  Securement Method Securing device (Describe) 03/21/20 0800  Urinary Catheter Interventions (if applicable) Unclamped 03/21/20 0800  Output (mL) 450 mL 03/21/20 0856    Anti-infectives:  Anti-infectives (From admission, onward)   Start     Dose/Rate Route Frequency Ordered Stop   03/20/20 1315  cefTRIAXone (ROCEPHIN) 1 g in sodium chloride 0.9 % 100 mL IVPB        1 g 200 mL/hr over 30 Minutes Intravenous  Once 03/20/20 1302 03/20/20 1416   03/20/20 1315  azithromycin (ZITHROMAX) 500 mg in sodium chloride 0.9 % 250 mL IVPB        500 mg 250 mL/hr over 60 Minutes Intravenous  Once 03/20/20 1302 03/20/20 1455      Microbiology: Results for orders placed or performed during the hospital encounter of 03/20/20  Blood culture (routine x 2)     Status: None (Preliminary result)   Collection Time: 03/20/20 11:41 AM   Specimen: BLOOD  Result Value Ref Range Status   Specimen Description BLOOD  RIGHT FOREARM  Final   Special Requests   Final    BOTTLES DRAWN AEROBIC AND ANAEROBIC Blood Culture adequate volume   Culture   Final    NO GROWTH < 24 HOURS Performed at [facility], [address], [place], [place] [address]    Report Status PENDING  Incomplete  Blood culture (routine x 2)     Status: None (Preliminary result)   Collection Time: 03/20/20  1:25 PM   Specimen: BLOOD  Result Value Ref Range Status   Specimen Description BLOOD LEFT FOREARM  Final   Special Requests   Final    BOTTLES DRAWN AEROBIC AND ANAEROBIC Blood Culture results may not be optimal due to an inadequate volume of blood received in culture bottles   Culture   Final    NO GROWTH < 24 HOURS Performed at [facility], [address] [place], [place] [address]    Report Status PENDING  Incomplete  Resp Panel by RT-PCR (Flu A&B, Covid) Nasopharyngeal Swab     Status: None   Collection Time: 03/20/20  5:44 PM   Specimen: Nasopharyngeal Swab; Nasopharyngeal(NP) swabs in vial transport medium  Result Value Ref Range Status   SARS Coronavirus 2 by RT PCR NEGATIVE NEGATIVE Final    Comment: (NOTE) SARS-CoV-2 target nucleic acids are NOT DETECTED.  The SARS-CoV-2 RNA is generally detectable in upper respiratory specimens during the acute phase of infection. The lowest concentration of SARS-CoV-2 viral copies this assay can detect is 138 copies/mL.  A negative result does not preclude SARS-Cov-2 infection and should not be used as the sole basis for treatment or other patient management decisions. A negative result may occur with  improper specimen collection/handling, submission of specimen other than nasopharyngeal swab, presence of viral mutation(s) within the areas targeted by this assay, and inadequate number of viral copies(<138 copies/mL). A negative result must be combined with clinical observations, patient history, and epidemiological information. The expected result is  Negative.  Fact Sheet for Patients:  BloggerCourse.com  Fact Sheet for Healthcare Providers:  SeriousBroker.it  This test is no t yet approved or cleared by the [place] FDA and  has been authorized for detection and/or diagnosis of SARS-CoV-2 by FDA under an Emergency Use Authorization (EUA). This EUA will remain  in effect (meaning this test can be used) for the duration of the COVID-19 declaration under Section 564(b)(1) of the Act, 21 U.S.C.section 360bbb-3(b)(1), unless the authorization is terminated  or revoked sooner.       Influenza A by PCR NEGATIVE NEGATIVE Final   Influenza B by PCR NEGATIVE NEGATIVE Final    Comment: (NOTE) The Xpert Xpress SARS-CoV-2/FLU/RSV plus assay is intended as an aid in the diagnosis of influenza from Nasopharyngeal swab specimens and should not be used as a sole basis for treatment. Nasal washings and aspirates are unacceptable for Xpert Xpress SARS-CoV-2/FLU/RSV testing.  Fact Sheet for Patients: BloggerCourse.com  Fact Sheet for Healthcare Providers: SeriousBroker.it  This test is not yet approved or cleared by the [place] FDA and has been authorized for detection and/or diagnosis of SARS-CoV-2 by FDA under an Emergency Use Authorization (EUA). This EUA will remain in effect (meaning this test can be used) for the duration of the COVID-19 declaration under Section 564(b)(1) of the Act, 21 U.S.C. section 360bbb-3(b)(1), unless the authorization is terminated or revoked.  Performed at [facility], [address] [place], [place] [address]   MRSA PCR Screening     Status: None   Collection Time: 03/20/20  6:38 PM   Specimen: Nasal Mucosa; Nasopharyngeal  Result Value Ref Range Status   MRSA by PCR NEGATIVE NEGATIVE Final    Comment:        The GeneXpert MRSA Assay (FDA approved for NASAL specimens only), is  one component of a comprehensive MRSA colonization surveillance program. It is not intended to diagnose MRSA infection nor to guide or monitor treatment for MRSA infections. Performed at [facility], [address] [place], [place] [address]     Best Practice/Protocols:  VTE Prophylaxis: Direct Thrombin Inhibitor GI Prophylaxis: Proton Pump Inhibitor   Events: 03/20/2020 admitted to stepdown for acute on chronic respiratory failure on BiPAP 03/21/2020 resolved acute phase of respiratory failure, awake and alert demanding increased dose of narcotics today   Studies: DG Chest 1 View  Result Date: 03/20/2020 CLINICAL DATA:  Shortness of breath EXAM: CHEST  1 VIEW COMPARISON:  02/19/2020 FINDINGS: The heart size and mediastinal contours are within normal limits. Mild, diffuse bilateral interstitial opacity. Bandlike scarring or atelectasis of the bilateral lung bases. The visualized skeletal structures are unremarkable. IMPRESSION: 1. Mild, diffuse bilateral interstitial opacity, most consistent with mild edema. 2. Bandlike scarring or atelectasis of the bilateral lung bases. Electronically Signed   By: Lauralyn Primes M.D.   On: 03/20/2020 11:52   MR BRAIN W WO CONTRAST  Result Date: 03/15/2020 CLINICAL DATA:  Follow-up MRI for brain cyst. EXAM: MRI HEAD WITHOUT AND WITH CONTRAST TECHNIQUE: Multiplanar, multiecho pulse sequences  of the brain and surrounding structures were obtained without and with intravenous contrast. CONTRAST:  9mL GADAVIST GADOBUTROL 1 MMOL/ML IV SOLN COMPARISON:  January 22, 2020. 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 Similar to prior, it demonstrates mild surrounding edema and complete FLAIR suppression. No substantial mass effect. It is in close proximity to the right MCA branch vessels. No acute hemorrhage. No hydrocephalus. No acute infarct. No midline shift. No extra-axial fluid collections.  Vascular: Major arterial flow voids are maintained at the skull base. Skull and upper cervical spine: Normal marrow signal. Sinuses/Orbits: Largely clear. Other: No sizable mastoid effusions. IMPRESSION: Similar 8 x 11 mm well-defined cyst in the anterior right temporal lobe, which is nonenhancing. Given the absence of contrast enhancement and characteristic appearance/location, this is favored to represent an anterior temporal lobe dilated perivascular space (which often do demonstrate surrounding edema). Recommend follow-up MRI with contrast in approximately 6 months to ensure stability and exclude malignancy. Electronically Signed   By: Feliberto Harts MD   On: 03/15/2020 14:42   DG Chest Port 1 View  Result Date: 03/21/2020 CLINICAL DATA:  Respiratory failure. HISTORY OF CHF, DM, HTN, COPD, CAD, GERD. EXAM: PORTABLE CHEST 1 VIEW COMPARISON:  Chest x-ray 03/20/2020. FINDINGS: The heart size and mediastinal contours are unchanged. Aortic arch calcifications. Left brachiocephalic stent. Spinal cord stimulator visualized overlying mediastinum. Streaky bibasilar airspace opacities likely represent atelectasis. Increased interstitial markings with Kerley B lines. No pleural effusion. No pneumothorax. Cervical surgical hardware. No acute osseous abnormality. IMPRESSION: 1. Pulmonary edema. 2. Bibasilar streaky airspace opacities likely represent atelectasis. Electronically Signed   By: Tish Frederickson M.D.   On: 03/21/2020 05:37    Consults: Palliative Care  Subjective:    Overnight Issues: Awake and alert today, conversant, asking for Dilaudid and oxycodone for pain.  Took AVAPS off and refused to wear it.  Objective:  Vital signs for last 24 hours: Temp:  [97.6 F (36.4 C)-98.7 F (37.1 C)] 98.4 F (36.9 C) (03/13 0742) Pulse Rate:  [67-95] 87 (03/13 1100) Resp:  [11-25] 15 (03/13 1100) BP: (105-161)/(37-90) 137/73 (03/13 1100) SpO2:  [80 %-100 %] 93 % (03/13 1131) FiO2 (%):  [40 %] 40 %  (03/13 0800) Weight:  [94.2 kg-94.3 kg] 94.3 kg (03/13 0500)  Hemodynamic parameters for last 24 hours:    Intake/Output from previous day: 03/12 0701 - 03/13 0700 In: -  Out: 3180 [Urine:3180]  Intake/Output this shift: Total I/O In: -  Out: 450 [Urine:450]  Vent settings for last 24 hours: FiO2 (%):  [40 %] 40 %  Physical Exam:  GENERAL: Chronically ill-appearing man plethoric appearing, no asterixis  HEAD: Normocephalic, atraumatic.  EYES: Pupils equal, round, reactive to light.  No scleral icterus.  MOUTH:  Oral mucosa moist NECK: Supple. No thyromegaly. Trachea midline. No JVD.  No adenopathy. PULMONARY: Poor air entry bilaterally, prolonged end expiratory phase, no wheezes noted.  No rhonchi.  CARDIOVASCULAR: S1 and S2.  A. fib with CVR. ABDOMEN: Protuberant, nondistended, soft, normoactive bowel sounds. MUSCULOSKELETAL: No joint deformity, no clubbing, there trace edema in the lower extremities to the level of mid shin. NEUROLOGIC:  Awake, alert, no overt focal deficit.  Speech is fluent.  No asterixis. SKIN: Intact,warm,dry.  Multiple ecchymotic areas.  Chronic stasis changes lower extremities PSYCH:  Manipulative behavior.  Assessment/Plan:  Acute on chronic respiratory failure with hypoxia and hypercarbia COPD exacerbation without infection Query oversedation from opiates Will need AVAPS at  nighttime Oxygen to maintain O2 between 88 to 92% Arterial blood gas today shows PaCO2 slightly lower than usual at 55 Baseline PaCO2 around 66  Transfer to Progressive Care, TRH service to pick up in the morning Management of COPD exacerbation as below Goals of care discussion as below  Stage IV COPD by GOLD criteria Exacerbation without active infection IV corticosteroids, decrease to Solu-Medrol 40 mg twice daily Continue DuoNeb every 4h Continue pulmonary hygiene Still no antibiotics indicated at present  Pulmonary edema Lasix x1 given in the ED Resume home dose  Demadex  Atrial fibrillation Continue Xarelto increased to 20 mg p.o. daily (home dose) Resume outpatient amiodarone and cardiac meds  Diabetes mellitus type 2 Resume home Lantus dose and sliding scale for meal coverage CBGs before meals and at bedtime No at bedtime coverage  Tobacco dependence due to cigarettes Issue adds complexity to his management Patient was counseled regards to discontinuation of smoking Did not appear to be receptive  Chronic opiate dependence Narcotic seeking behavior Continue low-dose Dilaudid IV as needed for severe pain Oxycodone as needed for moderate pain Consider chronic pain clinic  Goals of care  Homero Fellers discussion with regards to goals of care  He is engaged in a vicious cycle of narcotic sedation and COPD exacerbation  I have discussed with the patient that there were always be degrees of pain  If he wants to be absolutely pain-free he will need to accept a DNR status as increased narcotics will lead to worsening of his chronic respiratory failure  If he wants intubation and resuscitation and iwants to continue to live then he will have to tolerate a degree of pain as increasing narcotic doses will cause respiratory depression  Patient states that he will "think about it"  Patient then proceeded to state that he wanted to have 2 mg of Dilaudid every 4 h alternated with oxycodone 10 mg every 4 h, I explained to the patient that this is not possible given the severity of his COPD and respiratory failure  He will likely not wean if needs intubation again  Will obtain Palliative Care consult  Was previously DNR but RESCINDED and now is full code  Patient is approaching hospice criteria  Prophylaxis Continue Xarelto Protonix   LOS: 1 day   Additional comments: Care coordination was performed with bedside nurse.  Patient will be transferred to Progressive Care and transfer to hospitalist service in the morning.  Dr. Adria Dill,  the patient's primary pulmonologist has been notified of the patient's admission so that he can follow him from the pulmonary standpoint.  Level 3 follow-up  C. Danice Goltz, MD [facility] 03/21/2020  *This note was dictated using voice recognition software/Dragon.  Despite best efforts to proofread, errors can occur which can change the meaning.  Any change was purely unintentional.

## 2020-03-21 NOTE — Progress Notes (Signed)
Patient attempted to urinate in urinal and was only able to void small amount of blood and noted to be bleeding from penis again.  Pressure held and bleeding stopped, attending and urologist notified. Bladder scan completed and was .

## 2020-03-21 NOTE — Progress Notes (Signed)
This RN called to patient room by another RN.  Patient walked himself to the bathroom and pulled foley out.  Balloon still inflated, bleeding from penis. Pressure held and bleeding stopped. Dr. Jayme Cloud at bedside. Pain meds given.

## 2020-03-21 NOTE — Progress Notes (Signed)
Called to see patient because he pulled out Foley with balloon still up.  There was significant bleeding per urethra.  Patient is on Xarelto.  Currently bleeding has stopped.  Patient is in no distress.  I discussed with urology on-call, Dr. Apolinar Junes.  Would not place Foley back prophylactically.  If patient develops urinary retention it could mean he has retained clot.  Would then try to insert 18-20 coud catheter and irrigate clot.  If this occurs urology can be consulted formally.   Gailen Shelter, MD [facility]

## 2020-03-22 ENCOUNTER — Encounter: Payer: Self-pay | Admitting: Primary Care

## 2020-03-22 DIAGNOSIS — Z7189 Other specified counseling: Secondary | ICD-10-CM

## 2020-03-22 DIAGNOSIS — I509 Heart failure, unspecified: Secondary | ICD-10-CM

## 2020-03-22 DIAGNOSIS — Z515 Encounter for palliative care: Secondary | ICD-10-CM

## 2020-03-22 LAB — CBC
HCT: 32.2 % — ABNORMAL LOW (ref 39.0–52.0)
Hemoglobin: 9.7 g/dL — ABNORMAL LOW (ref 13.0–17.0)
MCH: 23 pg — ABNORMAL LOW (ref 26.0–34.0)
MCHC: 30.1 g/dL (ref 30.0–36.0)
MCV: 76.5 fL — ABNORMAL LOW (ref 80.0–100.0)
Platelets: 290 10*3/uL (ref 150–400)
RBC: 4.21 MIL/uL — ABNORMAL LOW (ref 4.22–5.81)
RDW: 20.4 % — ABNORMAL HIGH (ref 11.5–15.5)
WBC: 7.8 10*3/uL (ref 4.0–10.5)
nRBC: 0 % (ref 0.0–0.2)

## 2020-03-22 LAB — BASIC METABOLIC PANEL
Anion gap: 10 (ref 5–15)
BUN: 32 mg/dL — ABNORMAL HIGH (ref 8–23)
CO2: 37 mmol/L — ABNORMAL HIGH (ref 22–32)
Calcium: 9 mg/dL (ref 8.9–10.3)
Chloride: 93 mmol/L — ABNORMAL LOW (ref 98–111)
Creatinine, Ser: 1.37 mg/dL — ABNORMAL HIGH (ref 0.61–1.24)
GFR, Estimated: 57 mL/min — ABNORMAL LOW (ref >60)
Glucose, Bld: 183 mg/dL — ABNORMAL HIGH (ref 70–99)
Potassium: 4 mmol/L (ref 3.5–5.1)
Sodium: 140 mmol/L (ref 135–145)

## 2020-03-22 LAB — BLOOD GAS, ARTERIAL
Acid-Base Excess: 21.6 mmol/L — ABNORMAL HIGH (ref 0.0–2.0)
Allens test (pass/fail): POSITIVE — AB
Bicarbonate: 45.2 mmol/L — ABNORMAL HIGH (ref 20.0–28.0)
FIO2: 0.32
O2 Saturation: 99.3 %
Patient temperature: 37
pCO2 arterial: 45 mmHg (ref 32.0–48.0)
pH, Arterial: 7.61 (ref 7.350–7.450)
pO2, Arterial: 125 mmHg — ABNORMAL HIGH (ref 83.0–108.0)

## 2020-03-22 LAB — GLUCOSE, CAPILLARY
Glucose-Capillary: 191 mg/dL — ABNORMAL HIGH (ref 70–99)
Glucose-Capillary: 155 mg/dL — ABNORMAL HIGH (ref 70–99)
Glucose-Capillary: 229 mg/dL — ABNORMAL HIGH (ref 70–99)
Glucose-Capillary: 111 mg/dL — ABNORMAL HIGH (ref 70–99)

## 2020-03-22 LAB — IRON AND TIBC
Iron: 17 ug/dL — ABNORMAL LOW (ref 45–182)
Saturation Ratios: 3 % — ABNORMAL LOW (ref 17.9–39.5)
TIBC: 524 ug/dL — ABNORMAL HIGH (ref 250–450)
UIBC: 507 ug/dL

## 2020-03-22 LAB — MAGNESIUM: Magnesium: 2.4 mg/dL (ref 1.7–2.4)

## 2020-03-22 LAB — FERRITIN: Ferritin: 11 ng/mL — ABNORMAL LOW (ref 24–336)

## 2020-03-22 LAB — PHOSPHORUS: Phosphorus: 3.1 mg/dL (ref 2.5–4.6)

## 2020-03-22 MED ORDER — NALOXONE HCL 0.4 MG/ML IJ SOLN: 0.4000 mg | INTRAMUSCULAR | Status: DC | PRN

## 2020-03-22 MED ORDER — OXYCODONE HCL 5 MG PO TABS
5.0000 mg | ORAL_TABLET | Freq: Four times a day (QID) | ORAL | Status: DC | PRN
  Administered 2020-03-22 – 2020-03-23 (×4): 5 mg via ORAL
  Filled 2020-03-22 (×4): qty 1

## 2020-03-22 MED ORDER — IPRATROPIUM-ALBUTEROL 0.5-2.5 (3) MG/3ML IN SOLN
3.0000 mL | Freq: Two times a day (BID) | RESPIRATORY_TRACT | Status: DC
  Administered 2020-03-22 – 2020-03-23 (×2): 3 mL via RESPIRATORY_TRACT
  Filled 2020-03-22 (×2): qty 3

## 2020-03-22 MED ORDER — DULOXETINE HCL 30 MG PO CPEP
30.0000 mg | ORAL_CAPSULE | Freq: Every day | ORAL | Status: DC
  Administered 2020-03-22 – 2020-03-24 (×3): 30 mg via ORAL
  Filled 2020-03-22 (×2): qty 1

## 2020-03-22 MED ORDER — SODIUM CHLORIDE 0.9 % IV SOLN
510.0000 mg | Freq: Once | INTRAVENOUS | Status: AC
  Administered 2020-03-22: 510 mg via INTRAVENOUS
  Filled 2020-03-22: qty 17

## 2020-03-22 NOTE — Progress Notes (Signed)
Bladder scan at 2300 showed 511 mL. 18 Fr coude catheter not in stock on this floor. Requested from transport and will place as soon as it arrives.  Update 0139: 18 Fr unavailable. 16 Fr coude catheter placed by Manuela Schwartz, NP. Urine drainage is clear and pink-tinged with small clots.   Lamonte Richer, RN

## 2020-03-22 NOTE — Evaluation (Signed)
Occupational Therapy Evaluation Patient Details Name: Gregory Hall MRN: [id number] DOB: 1956-01-05 Today's Date: 03/22/2020    History of Present Illness 65 year old current smoker (half pack per day) with known GOLD stage IV COPD and chronic respiratory failure with hypoxia and hypercarbia on 3 to 4 L of oxygen who presented with the complaint of weakness and inability to void.  In addition the patient complained of increasing shortness of breath.  The patient was very vague with his symptomatology on initial presentation to the ED.  EMS was activated by his rest home when he was noted to be weak and had trouble breathing despite being on oxygen at 3 L/min.  On EMS arrival he was noted to have oxygen saturations at 64% and was subsequently placed on nonrebreather mask.  Per the rest home staff the patient had not voided since the day prior.   Clinical Impression   Patient presenting with decreased I in self care, balance, functional mobility/transfer, endurance, and safety awareness. Patient reports living in an assisted living facility PTA. He reports performing his own self care tasks but staff assist him with meals, medications, and other IADLs. Pt reports transferring into wheelchair during the day. This session pt was self limiting. Pt agreeable to OT intervention and then when asked to sit on EOB and don socks pt began to refuse. Pt reports back pain and therapist lifting bed up to therapist height to assist him with positioning and pt began calling out with therapist not even touching him. OT positioned pt in order to pull him up further in bed with pt continuing to call out "No". RN reports pt ambulating to bathroom yesterday and pulling out catheter but in very different state today. OT recommends short term rehab stay to increase functional performance before returning home.  Patient will benefit from acute OT to increase overall independence in the areas of ADLs, functional mobility,  and safety awareness in order to safely discharge to next venue of care.   Follow Up Recommendations  SNF;Supervision/Assistance - 24 hour    Equipment Recommendations  Other (comment) (defer to next venue of care)       Precautions / Restrictions Precautions Precautions: Fall Restrictions Weight Bearing Restrictions: No      Mobility Bed Mobility Overal bed mobility: Needs Assistance Bed Mobility: Rolling Rolling: Total assist         General bed mobility comments: total A with pt giving no effort for repositioning    Transfers                 General transfer comment: Pt refusal        ADL either performed or assessed with clinical judgement   ADL Overall ADL's : Needs assistance/impaired                                       General ADL Comments: Pt refusal to participate in these activities today.     Vision Baseline Vision/History: Wears glasses Wears Glasses: At all times Patient Visual Report: No change from baseline              Pertinent Vitals/Pain Pain Assessment: 0-10 Pain Score: 10-Worst pain ever Pain Location: back Pain Descriptors / Indicators: Other (Comment) (yelling out) Pain Intervention(s): Limited activity within patient's tolerance;Premedicated before session;Repositioned     Hand Dominance Right   Extremity/Trunk Assessment Upper Extremity Assessment Upper Extremity Assessment: Generalized  weakness   Lower Extremity Assessment Lower Extremity Assessment: Defer to PT evaluation       Communication Communication Communication: No difficulties   Cognition Arousal/Alertness: Lethargic Behavior During Therapy: WFL for tasks assessed/performed Overall Cognitive Status: History of cognitive impairments - at baseline                                 General Comments: Pt oriented to self and year only. Pt did verbalize being in hospital but reports different hospital name               Home Living Family/patient expects to be discharged to:: Assisted living Living Arrangements: Alone                           Home Equipment: Hand held shower head;Grab bars - tub/shower;Grab bars - toilet;Wheelchair - manual          Prior Functioning/Environment Level of Independence: Needs assistance    ADL's / Homemaking Assistance Needed: Pt reports he is able to perform ADL tasks independently. Staff assist with IADL tasks.   Comments: Pt reports he only takes a few steps to his wheelchair. He performs sink baths at baseline.        OT Problem List: Decreased strength;Decreased activity tolerance;Impaired balance (sitting and/or standing);Decreased cognition;Pain;Decreased safety awareness;Decreased knowledge of use of DME or AE      OT Treatment/Interventions: Self-care/ADL training;Therapeutic exercise;Energy conservation;DME and/or AE instruction;Cognitive remediation/compensation;Therapeutic activities;Balance training;Patient/family education;Manual therapy    OT Goals(Current goals can be found in the care plan section) Acute Rehab OT Goals Patient Stated Goal: "noooooooo" OT Goal Formulation: With patient Time For Goal Achievement: 04/05/20 Potential to Achieve Goals: Fair ADL Goals Pt Will Perform Grooming: with modified independence;standing Pt Will Perform Lower Body Dressing: with supervision;sit to/from stand Pt Will Transfer to Toilet: with supervision;ambulating Pt Will Perform Toileting - Clothing Manipulation and hygiene: with supervision;sit to/from stand  OT Frequency: Min 2X/week   Barriers to D/C:    none known at this time          AM-PAC OT "6 Clicks" Daily Activity     Outcome Measure Help from another person eating meals?: None Help from another person taking care of personal grooming?: A Lot Help from another person toileting, which includes using toliet, bedpan, or urinal?: Total Help from another person bathing  (including washing, rinsing, drying)?: Total Help from another person to put on and taking off regular upper body clothing?: A Lot Help from another person to put on and taking off regular lower body clothing?: Total 6 Click Score: 11   End of Session Nurse Communication: Mobility status  Activity Tolerance: Other (comment);Patient limited by pain (self limiting) Patient left: in bed;with call bell/phone within reach;with bed alarm set  OT Visit Diagnosis: Muscle weakness (generalized) (M62.81);Unsteadiness on feet (R26.81);Repeated falls (R29.6)                Time: 6378-5885 OT Time Calculation (min): 14 min Charges:  OT General Charges $OT Visit: 1 Visit OT Evaluation $OT Eval Moderate Complexity: 1 [address] [place], OTR/L , CBIS ascom [phone]  03/22/20, 3:22 PM

## 2020-03-22 NOTE — Progress Notes (Signed)
PROGRESS NOTE    Gregory Hall  [name]:[id number] DOB: 10/28/1955 DOA: 03/20/2020 PCP: Koren Bound, NP   Chief Complain: Weakness, inability to void  Brief Narrative:  Patient is a 65 year old male with history of stage IV COPD, chronic respiratory failure with hypoxia/hypercarbia on 3 to 4 L of oxygen at home who presented with weakness and inability to void.  Patient became lethargic and apparent in the emergency department with placed on BiPAP.  CO2 was found to 104.  Patient was admitted under  PCCM service.  Patient was transferred to [facility] service on 03/22/2020.  Hospital course remarkable for urinary retention.  Assessment & Plan:   Principal Problem:   Acute on chronic respiratory failure with hypoxia (HCC) Active Problems:   Hyperlipidemia   CAD (coronary artery disease)   HTN (hypertension)   Acute on chronic respiratory failure with hypoxia and hypercapnia (HCC)   Anxiety   Chronic prescription opiate use   Chronic pain syndrome   Atrial flutter (HCC)   Chronic respiratory failure with hypoxia (HCC)   Continuous tobacco abuse   Acute on chronic respiratory failure with hypoxia/hypercarbia: Secondary to COPD exacerbation and possible oversedation from opiates.  Initially put on BiPAP, now on BiPAP at night.  Initial CO2 as per ABG was 104.  Baseline PCO2 around 66.  ABG done today shows significant improvement in the hypercarbia, CO2 in the range of 40s  Stage 4 COPD: Admitted for exacerbation.  On Solu-Medrol 40 mg twice a day.  We will continue slow tapering.  Continue bronchodilators.  Imagings did not show pneumonia.  Pulmonary edema: Given a dose of Lasix in the emergency department.  On Demadex at home .  AKI: Creatinine of 1.3.  We will continue monitor renal function, avoid nephrotoxins.  Torsemide will be held, he had significant urine output since last few days  Microcytic anemia: Low iron as per  iron studies.  Hemoglobin stable in the range of 9.  We  will give him a dose of IV iron  Paroxysmal A. fib: On amiodarone and rate control medications which will be continued.  On Xarelto for anticoagulation.  Currently in normal sinus rhythm.  Diabetes type 2: Continue current insulin regimen.  Monitor blood sugars  Tobacco abuse: Counseled cessation.  Chronic opioid dependence/narcotic seeking behavior: On low-dose Dilaudid for severe pain.  Oxycodone for moderate pain as needed.  Patient looks significantly confused, opiates could have contributed.  Will minimize opiates we recommend follow-up with pain management as an outpatient.  Confusion:As per the caretaker,he is alert and oriented at baseline. Patient has been found to have cognitive decline recently as per earlier documentation from PCCM.  He is currently alert but not oriented.  Given a dose of Narcan today,opiods cud contributing.  MRI done on 3/7 showed 8 x 11 mm well-defined cyst in the anterior right temporal lobe,ecommended follow-up MRI with contrast in approximately 6 months to ensure stability and exclude malignancy.  Goals of care:Has end stage COPD.  Palliative care consulted.  Patient has multiple comorbidities, and his goals of care discussion.  Can be a candidate for hospice.  Weakness: PT/OT consulted. Has gait problems and chronic back pain as per the caretaker         DVT prophylaxis: Xarelto Code Status: Full code Family Communication: Caretaker on 03/22/20 Status is: Inpatient  Remains inpatient appropriate because:Severity of illness   Dispo: The patient is from: Home              Anticipated  d/c is to: Home vs SnF              Patient currently is not medically stable to d/c.   Difficult to place patient No    Consultants: PCCM  Procedures:None  Antimicrobials:  Anti-infectives (From admission, onward)   Start     Dose/Rate Route Frequency Ordered Stop   03/20/20 1315  cefTRIAXone (ROCEPHIN) 1 g in sodium chloride 0.9 % 100 mL IVPB        1  g 200 mL/hr over 30 Minutes Intravenous  Once 03/20/20 1302 03/20/20 1416   03/20/20 1315  azithromycin (ZITHROMAX) 500 mg in sodium chloride 0.9 % 250 mL IVPB        500 mg 250 mL/hr over 60 Minutes Intravenous  Once 03/20/20 1302 03/20/20 1455      Subjective: Patient seen and examined the bedside this morning.  Hemodynamically stable.  He was lying on the bed.  He is alert and communicates but not oriented to time or place.  Does not look in respiratory distress.  Denies any complaints.  Objective: Vitals:   03/21/20 2102 03/21/20 2340 03/22/20 0023 03/22/20 0502  BP: 136/79  (!) 136/57 (!) 164/73  Pulse: 94  93 94  Resp: Temp: 98.6 F (37 C)  99.5 F (37.5 C) 98.1 F (36.7 C)  TempSrc: Oral     SpO2: 94% 95% 100% 100%  Weight:    88.9 kg  Height:        Intake/Output Summary (Last 24 hours) at 03/22/2020 0735 Last data filed at 03/22/2020 0152 Gross per 24 hour  Intake 250 ml  Output 1550 ml  Net -1300 ml   Filed Weights   03/20/20 1838 03/21/20 0500 03/22/20 0502  Weight: 94.2 kg 94.3 kg 88.9 kg    Examination:  General exam: Very deconditioned, debilitated, weak Respiratory system: Bilateral diminished air sounds, mild wheezes cardiovascular system: S1 & S2 heard, RRR. No JVD, murmurs, rubs, gallops or clicks. No pedal edema. Gastrointestinal system: Abdomen is nondistended, soft and nontender. No organomegaly or masses felt. Normal bowel sounds heard. Central nervous system: Alert and awake but not  oriented. No focal neurological deficits. Extremities: No edema, no clubbing ,no cyanosis Skin: Scattered purpura  Data Reviewed: I have personally reviewed following labs and imaging studies  CBC: Recent Labs  Lab 03/20/20 1137 03/21/20 0447 03/22/20 0655  WBC 8.0 4.1 7.8  NEUTROABS 6.3  --   --   HGB 10.9* 10.3* 9.7*  HCT 38.1* 34.8* 32.2*  MCV 81.8 79.1* 76.5*  PLT 261 282 290   Basic Metabolic Panel: Recent Labs  Lab 03/20/20 1137  03/21/20 0447  NA 139 139  K 4.7 4.5  CL 94* 91*  CO2 39* 39*  GLUCOSE 100* 176*  BUN 19 23  CREATININE 1.26* 0.94  CALCIUM 9.1 8.9  MG 2.2 2.4  PHOS  --  2.5   GFR: Estimated Creatinine Clearance: 83.7 mL/min (by C-G formula based on SCr of 0.94 mg/dL). Liver Function Tests: Recent Labs  Lab 03/20/20 1137  AST 14*  ALT 11  ALKPHOS 122  BILITOT 0.5  PROT 7.0  ALBUMIN 3.7   No results for input(s): LIPASE, AMYLASE in the last 168 hours. No results for input(s): AMMONIA in the last 168 hours. Coagulation Profile: No results for input(s): INR, PROTIME in the last 168 hours. Cardiac Enzymes: No results for input(s): CKTOTAL, CKMB, CKMBINDEX, TROPONINI in the last 168 hours. BNP (last 3  results) No results for input(s): PROBNP in the last 8760 hours. HbA1C: Recent Labs    03/21/20 0447  HGBA1C 7.8*   CBG: Recent Labs  Lab 03/20/20 2310 03/21/20 0332 03/21/20 0740 03/21/20 1113 03/21/20 2101  GLUCAP 183* 175* 153* 147* 96   Lipid Profile: No results for input(s): CHOL, HDL, LDLCALC, TRIG, CHOLHDL, LDLDIRECT in the last 72 hours. Thyroid Function Tests: No results for input(s): TSH, T4TOTAL, FREET4, T3FREE, THYROIDAB in the last 72 hours. Anemia Panel: No results for input(s): VITAMINB12, FOLATE, FERRITIN, TIBC, IRON, RETICCTPCT in the last 72 hours. Sepsis Labs: Recent Labs  Lab 03/20/20 1137 03/20/20 1141 03/20/20 1326 03/20/20 1744  PROCALCITON 0.19  --   --   --   LATICACIDVEN  --  0.9 0.9 1.4    Recent Results (from the past 240 hour(s))  Blood culture (routine x 2)     Status: None (Preliminary result)   Collection Time: 03/20/20 11:41 AM   Specimen: BLOOD  Result Value Ref Range Status   Specimen Description BLOOD RIGHT FOREARM  Final   Special Requests   Final    BOTTLES DRAWN AEROBIC AND ANAEROBIC Blood Culture adequate volume   Culture   Final    NO GROWTH 2 DAYS Performed at [facility], [address], [place], [place]  [address]    Report Status PENDING  Incomplete  Blood culture (routine x 2)     Status: None (Preliminary result)   Collection Time: 03/20/20  1:25 PM   Specimen: BLOOD  Result Value Ref Range Status   Specimen Description BLOOD LEFT FOREARM  Final   Special Requests   Final    BOTTLES DRAWN AEROBIC AND ANAEROBIC Blood Culture results may not be optimal due to an inadequate volume of blood received in culture bottles   Culture   Final    NO GROWTH 2 DAYS Performed at [facility], [address] [place], [place] [address]    Report Status PENDING  Incomplete  Resp Panel by RT-PCR (Flu A&B, Covid) Nasopharyngeal Swab     Status: None   Collection Time: 03/20/20  5:44 PM   Specimen: Nasopharyngeal Swab; Nasopharyngeal(NP) swabs in vial transport medium  Result Value Ref Range Status   SARS Coronavirus 2 by RT PCR NEGATIVE NEGATIVE Final    Comment: (NOTE) SARS-CoV-2 target nucleic acids are NOT DETECTED.  The SARS-CoV-2 RNA is generally detectable in upper respiratory specimens during the acute phase of infection. The lowest concentration of SARS-CoV-2 viral copies this assay can detect is 138 copies/mL. A negative result does not preclude SARS-Cov-2 infection and should not be used as the sole basis for treatment or other patient management decisions. A negative result may occur with  improper specimen collection/handling, submission of specimen other than nasopharyngeal swab, presence of viral mutation(s) within the areas targeted by this assay, and inadequate number of viral copies(<138 copies/mL). A negative result must be combined with clinical observations, patient history, and epidemiological information. The expected result is Negative.  Fact Sheet for Patients:  BloggerCourse.com  Fact Sheet for Healthcare Providers:  SeriousBroker.it  This test is no t yet approved or cleared by the [place] FDA and  has  been authorized for detection and/or diagnosis of SARS-CoV-2 by FDA under an Emergency Use Authorization (EUA). This EUA will remain  in effect (meaning this test can be used) for the duration of the COVID-19 declaration under Section 564(b)(1) of the Act, 21 U.S.C.section 360bbb-3(b)(1), unless the authorization is terminated  or revoked sooner.       Influenza A by PCR NEGATIVE NEGATIVE Final   Influenza B by PCR NEGATIVE NEGATIVE Final    Comment: (NOTE) The Xpert Xpress SARS-CoV-2/FLU/RSV plus assay is intended as an aid in the diagnosis of influenza from Nasopharyngeal swab specimens and should not be used as a sole basis for treatment. Nasal washings and aspirates are unacceptable for Xpert Xpress SARS-CoV-2/FLU/RSV testing.  Fact Sheet for Patients: BloggerCourse.com  Fact Sheet for Healthcare Providers: SeriousBroker.it  This test is not yet approved or cleared by the [place] FDA and has been authorized for detection and/or diagnosis of SARS-CoV-2 by FDA under an Emergency Use Authorization (EUA). This EUA will remain in effect (meaning this test can be used) for the duration of the COVID-19 declaration under Section 564(b)(1) of the Act, 21 U.S.C. section 360bbb-3(b)(1), unless the authorization is terminated or revoked.  Performed at [facility], [address] [place], [place] [address]   MRSA PCR Screening     Status: None   Collection Time: 03/20/20  6:38 PM   Specimen: Nasal Mucosa; Nasopharyngeal  Result Value Ref Range Status   MRSA by PCR NEGATIVE NEGATIVE Final    Comment:        The GeneXpert MRSA Assay (FDA approved for NASAL specimens only), is one component of a comprehensive MRSA colonization surveillance program. It is not intended to diagnose MRSA infection nor to guide or monitor treatment for MRSA infections. Performed at [facility] [address],  [place], [place] [address]          Radiology Studies: DG Chest 1 View  Result Date: 03/20/2020 CLINICAL DATA:  Shortness of breath EXAM: CHEST  1 VIEW COMPARISON:  02/19/2020 FINDINGS: The heart size and mediastinal contours are within normal limits. Mild, diffuse bilateral interstitial opacity. Bandlike scarring or atelectasis of the bilateral lung bases. The visualized skeletal structures are unremarkable. IMPRESSION: 1. Mild, diffuse bilateral interstitial opacity, most consistent with mild edema. 2. Bandlike scarring or atelectasis of the bilateral lung bases. Electronically Signed   By: Lauralyn Primes M.D.   On: 03/20/2020 11:52   DG Chest Port 1 View  Result Date: 03/21/2020 CLINICAL DATA:  Respiratory failure. HISTORY OF CHF, DM, HTN, COPD, CAD, GERD. EXAM: PORTABLE CHEST 1 VIEW COMPARISON:  Chest x-ray 03/20/2020. FINDINGS: The heart size and mediastinal contours are unchanged. Aortic arch calcifications. Left brachiocephalic stent. Spinal cord stimulator visualized overlying mediastinum. Streaky bibasilar airspace opacities likely represent atelectasis. Increased interstitial markings with Kerley B lines. No pleural effusion. No pneumothorax. Cervical surgical hardware. No acute osseous abnormality. IMPRESSION: 1. Pulmonary edema. 2. Bibasilar streaky airspace opacities likely represent atelectasis. Electronically Signed   By: Tish Frederickson M.D.   On: 03/21/2020 05:37   ECHOCARDIOGRAM COMPLETE  Result Date: 03/21/2020    ECHOCARDIOGRAM REPORT   Patient Name:   HELIOS KOHLMANN Date of Exam: 03/21/2020 Medical Rec #:  [id number]             Height:       72.0 in Accession #:    [id number]            Weight:       207.7 lb Date of Birth:  05-29-1955             BSA:          2.165 m Patient Age:    85 years  BP:           161/61 mmHg Patient Gender: M                     HR:           87 bpm. Exam Location:  [facility] Procedure: 2D Echo and Intracardiac Opacification Agent Indications:      Pulmonary Hypertension I27.2  History:         Patient has prior history of Echocardiogram examinations, most                  recent 09/26/2019.  Sonographer:     Wonda Cerise RDCS Referring Phys:  [id number] CAR[address] [place] Saliva Diagnosing Phys: Julien Nordmann MD  Sonographer Comments: Technically difficult study due to poor echo windows. Image acquisition challenging due to patient body habitus and Image acquisition challenging due to respiratory motion. IMPRESSIONS  1. Left ventricular ejection fraction, by estimation, is 60 to 65%. The left ventricle has normal function. The left ventricle has no regional wall motion abnormalities. Left ventricular diastolic parameters are consistent with Grade I diastolic dysfunction (impaired relaxation).  2. Right ventricular systolic function is normal. The right ventricular size is normal. Tricuspid regurgitation signal is inadequate for assessing PA pressure.  3. The mitral valve was not well visualized. No evidence of mitral valve regurgitation. FINDINGS  Left Ventricle: Left ventricular ejection fraction, by estimation, is 60 to 65%. The left ventricle has normal function. The left ventricle has no regional wall motion abnormalities. Definity contrast agent was given IV to delineate the left ventricular  endocardial borders. The left ventricular internal cavity size was normal in size. There is no left ventricular hypertrophy. Left ventricular diastolic parameters are consistent with Grade I diastolic dysfunction (impaired relaxation). Right Ventricle: The right ventricular size is normal. No increase in right ventricular wall thickness. Right ventricular systolic function is normal. Tricuspid regurgitation signal is inadequate for assessing PA pressure. Left Atrium: Left atrial size was normal in size. Right Atrium: Right atrial size was normal in size. Pericardium: There is no evidence of pericardial effusion. Mitral Valve: The mitral valve was not well visualized. No  evidence of mitral valve regurgitation. No evidence of mitral valve stenosis. Tricuspid Valve: The tricuspid valve is normal in structure. Tricuspid valve regurgitation is not demonstrated. No evidence of tricuspid stenosis. Aortic Valve: The aortic valve was not well visualized. Aortic valve regurgitation is not visualized. No aortic stenosis is present. Aortic valve peak gradient measures 7.1 mmHg. Pulmonic Valve: The pulmonic valve was normal in structure. Pulmonic valve regurgitation is not visualized. No evidence of pulmonic stenosis. Aorta: The aortic root is normal in size and structure. Venous: The inferior vena cava is normal in size with greater than 50% respiratory variability, suggesting right atrial pressure of 3 mmHg. IAS/Shunts: No atrial level shunt detected by color flow Doppler.  LEFT VENTRICLE PLAX 2D LVIDd:         4.62 cm  Diastology LVIDs:         2.99 cm  LV e' medial:    5.66 cm/s LV PW:         1.26 cm  LV E/e' medial:  14.4 LV IVS:        1.14 cm  LV e' lateral:   9.25 cm/s LVOT diam:     2.00 cm  LV E/e' lateral: 8.8 LV SV:         59 LV SV Index:   27 LVOT Area:  3.14 cm  RIGHT VENTRICLE RV Basal diam:  3.84 cm TAPSE (M-mode): 2.7 cm LEFT ATRIUM           Index       RIGHT ATRIUM           Index LA diam:      3.30 cm 1.52 cm/m  RA Area:     13.90 cm LA Vol (A2C): 25.7 ml 11.87 ml/m RA Volume:   32.20 ml  14.87 ml/m LA Vol (A4C): 15.5 ml 7.16 ml/m  AORTIC VALVE AV Area (Vmax): 2.01 cm AV Vmax:        133.00 cm/s AV Peak Grad:   7.1 mmHg LVOT Vmax:      84.90 cm/s LVOT Vmean:     53.300 cm/s LVOT VTI:       0.189 m  AORTA Ao Root diam: 3.40 cm MITRAL VALVE MV Area (PHT): 3.34 cm    SHUNTS MV Decel Time: 227 msec    Systemic VTI:  0.19 m MV E velocity: 81.40 cm/s  Systemic Diam: 2.00 cm MV A velocity: 96.40 cm/s MV E/A ratio:  0.84 Julien Nordmann MD Electronically signed by Julien Nordmann MD Signature Date/Time: 03/21/2020/12:28:54 PM    Final         Scheduled Meds: .  amiodarone  200 mg Oral Daily  . atorvastatin  40 mg Oral Daily  . baclofen  20 mg Oral TID  . Chlorhexidine Gluconate Cloth  6 each Topical Q0600  . DULoxetine  60 mg Oral Daily  . insulin aspart  0-15 Units Subcutaneous TID WC  . insulin glargine  11 Units Subcutaneous QHS  . ipratropium-albuterol  3 mL Nebulization Q4H  . lamoTRIgine  100 mg Oral Daily  . methylPREDNISolone (SOLU-MEDROL) injection  40 mg Intravenous Q12H  . pantoprazole  40 mg Oral Daily  . sodium chloride flush  3 mL Intravenous Q12H  . tamsulosin  0.4 mg Oral QPC supper  . torsemide  40 mg Oral Daily  . traZODone  150 mg Oral QHS   Continuous Infusions: . sodium chloride       LOS: 2 days    Time spent: 35 mins.More than 50% of that time was spent in counseling and/or coordination of care.      Burnadette Pop, MD [facility] P3/14/2022, 7:35 AM

## 2020-03-22 NOTE — Progress Notes (Signed)
PT Cancellation Note  Patient Details Name: Gregory Hall MRN: [id number] DOB: 1955/12/05   Cancelled Treatment:    Reason Eval/Treat Not Completed: Other (comment). Consult received and chart reviewed. Pt sleeping upon arrival scooted all the way down to bottom of bed. With tactile cues, awakens and reports severe pain. Assisted with repositioning and pillow placement. Pt reports he is unable to work with therapy at this time. RN made aware that he is requesting pain meds. Will re-attempt tomorrow.   Ryman Rathgeber 03/22/2020, 2:00 PM  Elizabeth Palau, PT, DPT [phone]

## 2020-03-22 NOTE — Consult Note (Signed)
Consultation Note Date: 03/22/2020   Patient Name: Gregory Hall  DOB: 1955-06-11  MRN: [id number]  Age / Sex: 65 y.o., male  PCP: Koren Bound, NP Referring Physician: Burnadette Pop, MD  Reason for Consultation: Establishing goals of care and Psychosocial/spiritual support  HPI/Patient Profile: 65 y.o. male  with past medical history of COPD- 3 L O2 baseline, a fib, CAD, CHF, CKD, HTN, HLD, chronic pain, lives in a family care home admitted on 03/20/2020 with acute on chronic respiratory failure with hypoxia/hypercarbia, secondary to COPD exacerbation and possible oversedation from opiates.   Clinical Assessment and Goals of Care: I have reviewed medical records including EPIC notes, labs and imaging, received report from bedside nursing staff, examined the patient.  Mr. Perez, Dirico, is lying quietly in bed.  He will make and briefly keep eye contact.  He is oriented to self only, but is calm and cooperative.  He is unable to answer any of my questions or participate in any meaningful goals of care discussion.  He is unable to tell me his healthcare surrogate until I prompted him that it would be his sister.  Call to sister, Charletta Cousin to discuss diagnosis prognosis, GOC, EOL wishes, disposition and options.  No answer, left voicemail message requesting return call.      Kenzie is a resident of a group home.  He completed MOLST form September 2021 stating he would like to be DNR.  I am unsure if his wishes have changed.   Conference with attending, bedside nursing staff, transition of care team related to patient condition, needs, goals of care. PMT to continue to follow.  HCPOA   [facility] -[place] tells me that he is unmarried and has no children.  He tells me that his sister, Charletta Cousin will be his healthcare surrogate.    SUMMARY OF RECOMMENDATIONS   At this point full scope/full code  by default. Time for outcomes/improvements. Continue to reach out to family for goalsetting.   Code Status/Advance Care Planning:  Full code  -[place] completed MOLST form in September 2021 stating he would want DNR status.  Unable to reach sister to verify.  Determine if his wishes have changed.  Symptom Management:   Per hospitalist, no additional needs at this time.  Palliative Prophylaxis:   Frequent Pain Assessment and Oral Care  Additional Recommendations (Limitations, Scope, Preferences):  Full Scope Treatment  Psycho-social/Spiritual:   Desire for further Chaplaincy support:no  Additional Recommendations: Caregiving  Support/Resources and Education on Hospice  Prognosis:   Unable to determine, based on outcomes.  6 to 12 months or less would not be surprising based on prior functional status, relatively young age, difficulty in prognostication for COPD  Discharge Planning: At this point is qualified for short-term rehab.      Primary Diagnoses: Present on Admission: . Acute on chronic respiratory failure with hypoxia (HCC) . Acute on chronic respiratory failure with hypoxia and hypercapnia (HCC) . Anxiety . Atrial flutter (HCC) . CAD (coronary artery disease) . Chronic respiratory  failure with hypoxia (HCC) . Chronic pain syndrome . HTN (hypertension) . Hyperlipidemia . Continuous tobacco abuse   I have reviewed the medical record, interviewed the patient and family, and examined the patient. The following aspects are pertinent.  Past Medical History:  Diagnosis Date  . AKI (acute kidney injury) (HCC) 09/12/2019  . CAD (coronary artery disease)    s/p PTCA and stent x2  . Chest pain   . CHF (congestive heart failure) (HCC)   . Chronic pain syndrome   . COPD (chronic obstructive pulmonary disease) (HCC)   . Degenerative cervical disc   . Depression   . Diabetes mellitus without complication (HCC)   . Dyslipidemia   . GERD (gastroesophageal reflux  disease)   . Hernia 2014  . Hypertension   . MRSA (methicillin resistant staph aureus) culture positive 2011  . Neuropathy   . Nutcracker esophagus   . Rectus diastasis 07/19/2012   Social History   Socioeconomic History  . Marital status: Single    Spouse name: Not on file  . Number of children: Not on file  . Years of education: Not on file  . Highest education level: Not on file  Occupational History  . Not on file  Tobacco Use  . Smoking status: Current Every Day Smoker    Packs/day: 0.50    Years: 30.00    Pack years: 15.00    Types: Cigarettes    Last attempt to quit: 11/26/2019    Years since quitting: 0.3  . Smokeless tobacco: Never Used  Substance and Sexual Activity  . Alcohol use: No  . Drug use: No  . Sexual activity: Not on file  Other Topics Concern  . Not on file  Social History Narrative  . Not on file   Social Determinants of Health   Financial Resource Strain: Not on file  Food Insecurity: Not on file  Transportation Needs: Not on file  Physical Activity: Not on file  Stress: Not on file  Social Connections: Not on file   Family History  Family history unknown: Yes   Scheduled Meds: . amiodarone  200 mg Oral Daily  . atorvastatin  40 mg Oral Daily  . Chlorhexidine Gluconate Cloth  6 each Topical Q0600  . DULoxetine  30 mg Oral Daily  . insulin aspart  0-15 Units Subcutaneous TID WC  . insulin glargine  11 Units Subcutaneous QHS  . ipratropium-albuterol  3 mL Nebulization BID  . lamoTRIgine  100 mg Oral Daily  . methylPREDNISolone (SOLU-MEDROL) injection  40 mg Intravenous Q12H  . pantoprazole  40 mg Oral Daily  . sodium chloride flush  3 mL Intravenous Q12H  . tamsulosin  0.4 mg Oral QPC supper  . traZODone  150 mg Oral QHS   Continuous Infusions: . sodium chloride    . ferumoxytol     PRN Meds:.sodium chloride, docusate sodium, naLOXone (NARCAN)  injection, ondansetron (ZOFRAN) IV, oxyCODONE, polyethylene glycol, sodium chloride  flush Medications Prior to Admission:  Prior to Admission medications   Medication Sig Start Date End Date Taking? Authorizing Provider  amiodarone (PACERONE) 200 MG tablet Take 1 tablet (200 mg total) by mouth daily. 11/03/19  Yes Visser, Jacquelyn D, PA-C  atorvastatin (LIPITOR) 40 MG tablet Take 1 tablet (40 mg total) by mouth daily. 09/28/19  Yes Lewie Chamber, MD  baclofen (LIORESAL) 20 MG tablet Take 20 mg by mouth 3 (three) times daily.   Yes [provider]  budesonide (PULMICORT) 0.5 MG/2ML nebulizer solution Take  0.5 mg by nebulization 2 (two) times daily.   Yes [provider]  Cholecalciferol (VITAMIN D3) 50 MCG (2000 UT) TABS Take 2,000 Units by mouth daily.    Yes [provider]  DULoxetine (CYMBALTA) 60 MG capsule Take 60 mg by mouth daily.   Yes [provider]  Ensure Max Protein (ENSURE MAX PROTEIN) LIQD Take 330 mLs (11 oz total) by mouth 2 (two) times daily between meals. 06/14/19  Yes Sheikh, Omair Latif, DO  fexofenadine (ALLEGRA) 180 MG tablet Take 180 mg by mouth daily. 01/23/20  Yes [provider]  fluticasone (FLONASE) 50 MCG/ACT nasal spray Place 1 spray into both nostrils 2 (two) times daily. 12/15/19  Yes Esaw Grandchild A, DO  Fluticasone-Umeclidin-Vilant (TRELEGY ELLIPTA) 100-62.5-25 MCG/INH AEPB Inhale 1 puff into the lungs daily.  10/20/19  Yes [provider]  glyBURIDE (DIABETA) 2.5 MG tablet Take 2.5 mg by mouth daily. 01/01/20  Yes [provider]  glycopyrrolate (ROBINUL) 1 MG tablet Take 1 tablet (1 mg total) by mouth 3 (three) times daily. 02/21/20  Yes Arnetha Courser, MD  insulin glargine (LANTUS) 100 UNIT/ML injection Inject 11 Units into the skin at bedtime.    Yes [provider]  insulin lispro (HUMALOG) 100 UNIT/ML injection Inject 4 Units into the skin See admin instructions. Inject 4 units with blood sugar 150-250; inject 6 units 251-350; greater than 351 call provider; Hold for FSBG  <150   Yes [provider]  ipratropium-albuterol (DUONEB) 0.5-2.5 (3) MG/3ML SOLN Take 3 mLs by nebulization every 6 (six) hours as needed. Patient taking differently: Take 3 mLs by nebulization every 6 (six) hours as needed (wheezing). 06/14/19  Yes Sheikh, Omair Latif, DO  lamoTRIgine (LAMICTAL) 100 MG tablet Take 100 mg by mouth daily.   Yes [provider]  magnesium oxide (MAG-OX) 400 (241.3 Mg) MG tablet Take 1 tablet by mouth daily. 12/24/19  Yes [provider]  midodrine (PROAMATINE) 10 MG tablet Take 1 tablet (10 mg total) by mouth 2 (two) times daily. Take if top blood pressure reading is less than 100 02/16/20  Yes Dunn, Ryan M, PA-C  NAC 600 MG CAPS Take 600 mg by mouth daily. 09/08/19  Yes [provider]  ondansetron (ZOFRAN-ODT) 4 MG disintegrating tablet Take 1 tablet (4 mg total) by mouth every 8 (eight) hours as needed for nausea or vomiting. 02/21/20  Yes Arnetha Courser, MD  Oxycodone HCl 10 MG TABS Take 1 tablet (10 mg total) by mouth 3 (three) times daily. Patient taking differently: Take 10 mg by mouth every 6 (six) hours. 09/14/19  Yes Danford, Earl Lites, MD  oxymetazoline (AFRIN) 0.05 % nasal spray Place 2 sprays into both nostrils 2 (two) times daily as needed for congestion.   Yes [provider]  pantoprazole (PROTONIX) 40 MG tablet Take 40 mg by mouth daily.    Yes [provider]  pregabalin (LYRICA) 200 MG capsule Take 1 capsule (200 mg total) by mouth 3 (three) times daily. 09/14/19  Yes Danford, Earl Lites, MD  rivaroxaban (XARELTO) 20 MG TABS tablet Take 20 mg by mouth daily with supper.   Yes [provider]  roflumilast (DALIRESP) 500 MCG TABS tablet Take 0.5 tablets (250 mcg total) by mouth daily. 02/21/20  Yes Arnetha Courser, MD  sodium chloride (OCEAN) 0.65 % SOLN nasal spray Place 1 spray into both nostrils as needed for congestion. 12/15/19  Yes Esaw Grandchild A, DO  tamsulosin (FLOMAX) 0.4 MG CAPS  capsule  Take 1 capsule (0.4 mg total) by mouth daily after supper. 10/10/19  Yes Lynn Ito, MD  torsemide (DEMADEX) 20 MG tablet Take 2 tablets (40 mg total) by mouth daily. 01/21/20 04/20/20 Yes Hackney, Jarold Song, FNP  traZODone (DESYREL) 150 MG tablet Take 150 mg by mouth at bedtime. 01/23/20  Yes [provider]  Ubrogepant (UBRELVY) 100 MG TABS Take 100 mg by mouth daily. 02/22/20  Yes Arnetha Courser, MD  guaiFENesin (MUCINEX) 600 MG 12 hr tablet Take 1 tablet (600 mg total) by mouth 2 (two) times daily. 02/21/20   Arnetha Courser, MD  NITROSTAT 0.4 MG SL tablet DISSOLVE (1) TABLET UNDER TONGUE AS NEEDED TO RELIEVE CHEST PAIN. MAYREPEAT EVERY 5 MINUTES. Patient taking differently: Place 0.4 mg under the tongue every 5 (five) minutes as needed for chest pain. 10/06/15   Iran Ouch, MD  senna-docusate (SENOKOT-S) 8.6-50 MG tablet Take 1 tablet by mouth daily.     [provider]   Allergies  Allergen Reactions  . Acetaminophen Other (See Comments)    Reaction:  Unknown  Kidney failure Told not to take from home M.D. Related to kidney and renal failure   . Fentanyl   . Nsaids Other (See Comments)    Reaction:  Unknown  Kidney failure Patient states not to take from home M.D. Related to kidney and renal failure.  Other reaction(s): Unknown   Review of Systems  Unable to perform ROS: Mental status change    Physical Exam Vitals and nursing note reviewed.  Constitutional:      General: He is not in acute distress.    Appearance: He is ill-appearing.  HENT:     Head: Normocephalic and atraumatic.  Cardiovascular:     Rate and Rhythm: Normal rate.  Pulmonary:     Effort: Pulmonary effort is normal. No tachypnea.  Skin:    General: Skin is warm and dry.  Neurological:     Mental Status: He is alert.     Comments: Oriented to self only  Psychiatric:     Comments: Calm and cooperative     Vital Signs: BP (!) 160/75 (BP Location: Left Arm)   Pulse 90   Temp  98.3 F (36.8 C) (Axillary)   Resp 18   Ht 6' (1.829 m)   Wt 88.9 kg   SpO2 96%   BMI 26.58 kg/m  Pain Scale: 0-10   Pain Score: Asleep   SpO2: SpO2: 96 % O2 Device:SpO2: 96 % O2 Flow Rate: .O2 Flow Rate (L/min): 3 L/min  IO: Intake/output summary:   Intake/Output Summary (Last 24 hours) at 03/22/2020 1250 Last data filed at 03/22/2020 1141 Gross per 24 hour  Intake 250 ml  Output 2025 ml  Net -1775 ml    LBM: Last BM Date: 03/21/20 Baseline Weight: Weight: 90.7 kg Most recent weight: Weight: 88.9 kg     Palliative Assessment/Data:   Flowsheet Rows   Flowsheet Row Most Recent Value  Intake Tab   Referral Department Hospitalist  Unit at Time of Referral Intermediate Care Unit  Palliative Care Primary Diagnosis Pulmonary  Date Notified 03/20/20  Palliative Care Type Return patient Palliative Care  Reason for referral Clarify Goals of Care  Date of Admission 03/20/20  Date first seen by Palliative Care 03/22/20  # of days Palliative referral response time 2 Day(s)  # of days IP prior to Palliative referral 0  Clinical Assessment   Palliative Performance Scale Score 40%  Pain Max last 24 hours  Not able to report  Pain Min Last 24 hours Not able to report  Dyspnea Max Last 24 Hours Not able to report  Dyspnea Min Last 24 hours Not able to report  Psychosocial & Spiritual Assessment   Palliative Care Outcomes       Time In: 1545 Time Out: 1615 Time Total: 30 minutes  Greater than 50%  of this time was spent counseling and coordinating care related to the above assessment and plan.  Signed by: Katheran Awe, NP   Please contact Palliative Medicine Team phone at [phone] for questions and concerns.  For individual provider: See Loretha Stapler

## 2020-03-23 DIAGNOSIS — G894 Chronic pain syndrome: Secondary | ICD-10-CM

## 2020-03-23 LAB — BASIC METABOLIC PANEL
Anion gap: 10 (ref 5–15)
BUN: 27 mg/dL — ABNORMAL HIGH (ref 8–23)
CO2: 33 mmol/L — ABNORMAL HIGH (ref 22–32)
Calcium: 9.1 mg/dL (ref 8.9–10.3)
Chloride: 98 mmol/L (ref 98–111)
Creatinine, Ser: 1.08 mg/dL (ref 0.61–1.24)
GFR, Estimated: >60 mL/min (ref >60)
Glucose, Bld: 151 mg/dL — ABNORMAL HIGH (ref 70–99)
Potassium: 3.9 mmol/L (ref 3.5–5.1)
Sodium: 141 mmol/L (ref 135–145)

## 2020-03-23 LAB — CBC WITH DIFFERENTIAL/PLATELET
Abs Immature Granulocytes: 0.01 10*3/uL (ref 0.00–0.07)
Basophils Absolute: 0 10*3/uL (ref 0.0–0.1)
Basophils Relative: 0 %
Eosinophils Absolute: 0 10*3/uL (ref 0.0–0.5)
Eosinophils Relative: 0 %
HCT: 32.1 % — ABNORMAL LOW (ref 39.0–52.0)
Hemoglobin: 9.8 g/dL — ABNORMAL LOW (ref 13.0–17.0)
Immature Granulocytes: 0 %
Lymphocytes Relative: 8 %
Lymphs Abs: 0.5 10*3/uL — ABNORMAL LOW (ref 0.7–4.0)
MCH: 23.4 pg — ABNORMAL LOW (ref 26.0–34.0)
MCHC: 30.5 g/dL (ref 30.0–36.0)
MCV: 76.6 fL — ABNORMAL LOW (ref 80.0–100.0)
Monocytes Absolute: 0.5 10*3/uL (ref 0.1–1.0)
Monocytes Relative: 7 %
Neutro Abs: 5.5 10*3/uL (ref 1.7–7.7)
Neutrophils Relative %: 85 %
Platelets: 293 10*3/uL (ref 150–400)
RBC: 4.19 MIL/uL — ABNORMAL LOW (ref 4.22–5.81)
RDW: 20.8 % — ABNORMAL HIGH (ref 11.5–15.5)
WBC: 6.5 10*3/uL (ref 4.0–10.5)
nRBC: 0 % (ref 0.0–0.2)

## 2020-03-23 LAB — GLUCOSE, CAPILLARY
Glucose-Capillary: 203 mg/dL — ABNORMAL HIGH (ref 70–99)
Glucose-Capillary: 143 mg/dL — ABNORMAL HIGH (ref 70–99)
Glucose-Capillary: 223 mg/dL — ABNORMAL HIGH (ref 70–99)
Glucose-Capillary: 171 mg/dL — ABNORMAL HIGH (ref 70–99)

## 2020-03-23 MED ORDER — BUDESONIDE 0.5 MG/2ML IN SUSP
0.5000 mg | Freq: Two times a day (BID) | RESPIRATORY_TRACT | Status: DC
  Administered 2020-03-23 – 2020-03-24 (×2): 0.5 mg via RESPIRATORY_TRACT
  Filled 2020-03-23 (×2): qty 2

## 2020-03-23 MED ORDER — OXYCODONE HCL 5 MG PO TABS
10.0000 mg | ORAL_TABLET | Freq: Four times a day (QID) | ORAL | Status: DC | PRN
  Administered 2020-03-23 – 2020-03-24 (×5): 10 mg via ORAL
  Filled 2020-03-23 (×5): qty 2

## 2020-03-23 MED ORDER — RIVAROXABAN 20 MG PO TABS
20.0000 mg | ORAL_TABLET | Freq: Every day | ORAL | Status: DC
  Administered 2020-03-23: 20 mg via ORAL
  Filled 2020-03-23: qty 1

## 2020-03-23 MED ORDER — LIDOCAINE 5 % EX PTCH
1.0000 | MEDICATED_PATCH | CUTANEOUS | Status: DC
  Filled 2020-03-23 (×2): qty 1

## 2020-03-23 MED ORDER — IPRATROPIUM-ALBUTEROL 0.5-2.5 (3) MG/3ML IN SOLN
3.0000 mL | Freq: Four times a day (QID) | RESPIRATORY_TRACT | Status: DC
  Administered 2020-03-23 – 2020-03-24 (×5): 3 mL via RESPIRATORY_TRACT
  Filled 2020-03-23 (×3): qty 3

## 2020-03-23 MED ORDER — TORSEMIDE 20 MG PO TABS
20.0000 mg | ORAL_TABLET | Freq: Every day | ORAL | Status: DC
  Administered 2020-03-23: 20 mg via ORAL
  Filled 2020-03-23: qty 1

## 2020-03-23 NOTE — Progress Notes (Signed)
Patient refused bipap for the night.  Remains on 2LPM oxygen via nasal cannula.

## 2020-03-23 NOTE — Progress Notes (Signed)
MD Adhikari requests voiding trial. Foley removed at [phone] by this RN. Output to be monitored and update to MD.

## 2020-03-23 NOTE — Evaluation (Signed)
Physical Therapy Evaluation Patient Details Name: Gregory Hall MRN: [id number] DOB: 1955/01/18 Today's Date: 03/23/2020   History of Present Illness  Pt is a 65 y.o. male presenting to hospital 3/12 for SOB, worsening leg swelling, and decreased urine output.  Pt admitted with acute on chronic respiratory failure with hypoxia and hypercarbia, COPD exacerbation, question oversedation from opiates, pulmonary edema, and a-fib.   PMH includes COPD, CHF, chronic pain, DM, htn, CAD, depression, tobacco abuse, AKI, chest pain, neuropathy, h/o back surgery, cardioversion, R foot surgery, neck surgery.  Chronic LBP and neck pain.  3-4 L home O2 baseline.  Clinical Impression  Prior to hospital admission, pt was modified independent w/c level functional mobility; working with HHPT on walking with RW; lives at family care home (ramp to enter).  Currently pt is SBA with bed mobility; CGA to SBA with transfers; and SBA with pt propeling self 120 feet in w/c (using B UE's at times but mostly B LE's).  Overall pt demonstrating safe w/c level functional mobility.  Pt would benefit from HHPT to continue working on strengthening and ambulation (walking deferred during session d/t pt initially reporting he was non-ambulatory but then end of session pt reporting he was working on walking with HHPT).  Pt pre-medicated with pain meds for PT session.  Pain 4/10 back and neck beginning and end of session.  Pt would benefit from skilled PT to address noted impairments and functional limitations (see below for any additional details).  Upon hospital discharge, pt would benefit from HHPT.    Follow Up Recommendations Home health PT    Equipment Recommendations  Other (comment) (pt has needed DME at home already)    Recommendations for Other Services       Precautions / Restrictions Precautions Precautions: Fall Restrictions Weight Bearing Restrictions: No      Mobility  Bed Mobility Overal bed mobility:  Needs Assistance Bed Mobility: Supine to Sit;Sit to Supine     Supine to sit: Supervision Sit to supine: Supervision   General bed mobility comments: bed flat; mild increased effort to perform on own    Transfers Overall transfer level: Needs assistance Equipment used: None Transfers: Stand Pivot Transfers   Stand pivot transfers: Min guard;Supervision       General transfer comment: stand step turn bed to/from w/c; steady and safe  Ambulation/Gait             General Gait Details: Deferred (pt initially reporting being non-ambulatory but then pt reporting end of session he is working with HHPT on walking with RW)  Administrator mobility: Yes Wheelchair propulsion: Both upper extremities;Both lower extermities Wheelchair parts: Supervision/cueing Distance: 120 Wheelchair Assistance Details (indicate cue type and reason): pt able to navigate around obstacles safely and manage brakes safely  Modified Rankin (Stroke Patients Only)       Balance Overall balance assessment: Needs assistance Sitting-balance support: No upper extremity supported;Feet supported Sitting balance-Leahy Scale: Normal Sitting balance - Comments: steady sitting reaching outside BOS   Standing balance support: Single extremity supported Standing balance-Leahy Scale: Fair Standing balance comment: steady with at least single UE support                             Pertinent Vitals/Pain Pain Assessment: 0-10 Pain Score: 4  Pain Location: back and neck Pain Descriptors / Indicators: Sore;Aching Pain Intervention(s):  Limited activity within patient's tolerance;Monitored during session;Premedicated before session;Repositioned  HR WFL during sessions activities. O2 sats 90% or greater on 2 L via nasal cannula during sessions activities (91% at rest beginning/end of session)--nurse notified.    Home Living Family/patient  expects to be discharged to:: Assisted living [facility]) Living Arrangements: Alone             Home Equipment: Hand held shower head;Grab bars - tub/shower;Grab bars - toilet;Wheelchair - manual Additional Comments: ramp to enter; 1 level    Prior Function Level of Independence: Needs assistance   Gait / Transfers Assistance Needed: Modified independent w/c level functional mobility.  Receiving HHPT 2x/week working on walking with RW and strengthening.  ADL's / Homemaking Assistance Needed: Staff set-up for sponge baths; assist for meals and medications.        Hand Dominance   Dominant Hand: Right    Extremity/Trunk Assessment   Upper Extremity Assessment Upper Extremity Assessment: Generalized weakness    Lower Extremity Assessment Lower Extremity Assessment: Generalized weakness    Cervical / Trunk Assessment Cervical / Trunk Assessment: Normal  Communication   Communication: No difficulties  Cognition Arousal/Alertness: Awake/alert Behavior During Therapy: WFL for tasks assessed/performed Overall Cognitive Status: Within Functional Limits for tasks assessed                                        General Comments   Nursing cleared pt for participation in physical therapy.  Pt agreeable to PT session.    Exercises     Assessment/Plan    PT Assessment Patient needs continued PT services  PT Problem List Decreased strength;Decreased activity tolerance;Decreased mobility;Pain       PT Treatment Interventions DME instruction;Gait training;Functional mobility training;Therapeutic activities;Therapeutic exercise;Balance training;Patient/family education;Wheelchair mobility training    PT Goals (Current goals can be found in the Care Plan section)  Acute Rehab PT Goals Patient Stated Goal: to go back to family care home PT Goal Formulation: With patient Time For Goal Achievement: 04/06/20 Potential to Achieve Goals: Good     Frequency Min 2X/week   Barriers to discharge        Co-evaluation               AM-PAC PT "6 Clicks" Mobility  Outcome Measure Help needed turning from your back to your side while in a flat bed without using bedrails?: None Help needed moving from lying on your back to sitting on the side of a flat bed without using bedrails?: A Little Help needed moving to and from a bed to a chair (including a wheelchair)?: A Little Help needed standing up from a chair using your arms (e.g., wheelchair or bedside chair)?: A Little Help needed to walk in hospital room?: A Lot Help needed climbing 3-5 steps with a railing? : Total 6 Click Score: 16    End of Session Equipment Utilized During Treatment: Gait belt Activity Tolerance: Patient tolerated treatment well Patient left: in bed;with call bell/phone within reach;with bed alarm set Nurse Communication: Mobility status;Precautions;Other (comment) (pt's O2 sats 91% on 2 L O2 via nasal cannula) PT Visit Diagnosis: Other abnormalities of gait and mobility (R26.89);Muscle weakness (generalized) (M62.81);Difficulty in walking, not elsewhere classified (R26.2);Pain    Time: 7510-2585 PT Time Calculation (min) (ACUTE ONLY): 30 min   Charges:   PT Evaluation $PT Eval Low Complexity: 1 Low PT Treatments $Therapeutic Activity:  8-22 mins       Hendricks Limes, PT 03/23/20, 3:48 PM

## 2020-03-23 NOTE — Care Management Important Message (Signed)
Important Message  Patient Details  Name: Gregory Hall MRN: [id number] Date of Birth: July 29, 1955   Medicare Important Message Given:  Yes     [name] 03/23/2020, 11:18 AM

## 2020-03-23 NOTE — Progress Notes (Signed)
Palliative:  Mr. Mykale, Gandolfo, is resting quietly in bed.  He greets me, making and somewhat keeping eye contact.  He is oriented X 3 today and able to make his needs known.  He does look much improved today.  Nursing staff is at bedside and they agree that Jemarcus is much improved today, sharing that his Foley catheter has been removed moved and they are waiting a voiding trial.  Lenardo tells me that he is having pain which is preventing him from working with physical therapy.  We talked about finding a balance in managing his pain, but not making him to obtunded, reducing his breathing.  PT to work with Maurine Minister for recommendations.  He tells me that he is considering returning to his group home.  We talked about CODE STATUS as he completed a MOST form in September 2021 stating he wanted DNR.  He states that he does not want DNR, but would accept long-term life support including trach/PEG if needed.  Conference with attending, bedside nursing staff and [facility] team related to patient condition, needs, goals of care, CODE STATUS.  PMT to continue to follow. ACP updated  Plan:  Continue with FULL SCOPE/CODE, would accept trach/PEG.  To work with PT this afternoon for recommendations.  At this point return to group home with home health versus short-term rehab.  35 minutes  Lillia Carmel, NP Palliative Medicine Team  Team Phone [phone] Greater than 50% of this time was spent counseling and coordinating care related to the above assessment and plan.

## 2020-03-23 NOTE — Progress Notes (Signed)
Bladder scanned patient at 1450. total scanned. Patient given ordered lasix. Will continue to monitor for output and need for in/out cath.   1639 Patient voided yellow urine with trace of blood. Frank blood found to be on linens. Linens changed. Patient reports painful to urinate. Will update MD.

## 2020-03-23 NOTE — Progress Notes (Signed)
Mobility Specialist - Progress Note   03/23/20 1200  Mobility  Activity Refused mobility  Mobility performed by Mobility specialist    Pt declined mobility this date d/t pain. RN in at the time and offers education and encouragement for participation with PT/mobility, but pt further declines and request return for a later time.    Gregory Hall Mobility Specialist 03/23/20, 12:11 PM

## 2020-03-23 NOTE — Progress Notes (Addendum)
PROGRESS NOTE    Gregory Hall  ZOX:[id number] DOB: 1955-11-18 DOA: 03/20/2020 PCP: Koren Bound, NP   Chief Complain: Weakness, inability to void  Brief Narrative:  Patient is a 65 year old male with history of stage IV COPD, chronic respiratory failure with hypoxia/hypercarbia  who presented from group home with weakness and inability to void.  Patient became lethargic and apparently in the emergency department with placed on BiPAP.  CO2 was found to 104.  Patient was admitted under  PCCM service.  Patient was transferred to [facility] service on 03/22/2020.  Respiratory status is improving, on 2 L of oxygen.  PT/OT recommended skilled nursing facility on discharge.  Assessment & Plan:   Principal Problem:   Acute on chronic respiratory failure with hypoxia (HCC) Active Problems:   Hyperlipidemia   CAD (coronary artery disease)   HTN (hypertension)   Acute on chronic respiratory failure with hypoxia and hypercapnia (HCC)   Anxiety   Chronic prescription opiate use   Chronic pain syndrome   Atrial flutter (HCC)   Chronic respiratory failure with hypoxia (HCC)   Continuous tobacco abuse   Acute on chronic respiratory failure with hypoxia/hypercarbia: Secondary to COPD exacerbation and possible oversedation from opiates.  Initially put on BiPAP, now on BiPAP at night.  Initial CO2 as per ABG was 104.  Baseline PCO2 around 66.  ABG done on 03/22/20 showed significant improvement in the hypercarbia, CO2 in the range of 40s.  Currently on 2 L of oxygen per minute.   Stage 4 COPD: Admitted for exacerbation.  On Solu-Medrol 40 mg twice a day.  He still have bilateral wheezes today so I will continue IV steroids.  We will continue to check if we can wean the steroids to oral.  I will add Pulmicort, continue DuoNeb .  Imagings did not show pneumonia. He is on 3L of oxygen at home chronically  Pulmonary edema: Given a dose of Lasix in the emergency department.  On Demadex at home  .Resolved  AKI: He had significant urine output since last few days. AKI resolved after   Torsemide was held,now restarting on low dose  Microcytic anemia: Found to have low iron as per  iron studies.  Hemoglobin stable in the range of 9.  S/P  dose of IV iron on 03/22/20  Paroxysmal A. fib: On amiodarone and rate control medications which will be continued.  On Xarelto for anticoagulation.  Currently in normal sinus rhythm.  Diabetes type 2: Continue current insulin regimen.  Monitor blood sugars  Tobacco abuse: Counseled for  cessation.  Chronic opioid dependence/narcotic seeking behavior:He was on  Dilaudid ,Oxycodone .  Patient looked significantly confused, opiates could have contributed.  Will minimize opiates, we recommend follow-up with pain management as an outpatient.   Confusion:As per the caretaker,he is alert and oriented at baseline. Patient has been found to have cognitive decline recently as per earlier documentation from PCCM.  He was found to be severely confused on 03/22/2020 so opiates tapered.  Given a dose of Narcan today with significant improvement in the mental status.  Today he is alert and oriented.   MRI done on 3/7 showed 8 x 11 mm well-defined cyst in the anterior right temporal lobe,recommended follow-upMRI with contrast in approximately 6 months to ensure stability and exclude malignancy. As per the caretaker, he is often confused after he takes too much pain medication/opioids.  He has difficulty in ambulation due to back pain and takes narcotics.  Goals of care:Has end stage  COPD. Patient has multiple comorbidities, palliative care has been following.  Remains full code  Weakness: PT/OT consulted. Has gait problems and chronic back pain as per the caretaker.  PT/OT recommended skilled nursing facility on discharge.  TOC consulted.  Patient has been declining skilled facility.         DVT prophylaxis: Xarelto Code Status: Full code Family Communication:  Caretaker on 03/22/20 Status is: Inpatient  Remains inpatient appropriate because:Severity of illness   Dispo: The patient is from: Home              Anticipated d/c is to: Home vs SnF              Patient currently is not medically stable to d/c.   Difficult to place patient No  Not ready for discharge today because he still has significant bilateral wheezing.  PT/OT reevaluating the patient.  Consultants: PCCM  Procedures:None  Antimicrobials:  Anti-infectives (From admission, onward)   Start     Dose/Rate Route Frequency Ordered Stop   03/20/20 1315  cefTRIAXone (ROCEPHIN) 1 g in sodium chloride 0.9 % 100 mL IVPB        1 g 200 mL/hr over 30 Minutes Intravenous  Once 03/20/20 1302 03/20/20 1416   03/20/20 1315  azithromycin (ZITHROMAX) 500 mg in sodium chloride 0.9 % 250 mL IVPB        500 mg 250 mL/hr over 60 Minutes Intravenous  Once 03/20/20 1302 03/20/20 1455      Subjective: Patient seen and examined the bedside this morning.  He is alert and oriented today.  Looks more comfortable but complains of severe pain everywhere.  He says he cannot participate with physical therapy because of pain.  We added lidocaine patch and increased the oxycodone to 10 mg.  He is opiate dependent  Objective: Vitals:   03/22/20 1620 03/22/20 1959 03/22/20 2006 03/23/20 0428  BP: 138/71 (!) 142/57  131/67  Pulse: 82 86  89  Resp: Temp: 98.4 F (36.9 C) 98.2 F (36.8 C)  98 F (36.7 C)  TempSrc: Oral Oral  Oral  SpO2: 96% 94% 91% 91%  Weight:    88.2 kg  Height:        Intake/Output Summary (Last 24 hours) at 03/23/2020 0748 Last data filed at 03/23/2020 0500 Gross per 24 hour  Intake 1061.62 ml  Output 2175 ml  Net -1113.38 ml   Filed Weights   03/21/20 0500 03/22/20 0502 03/23/20 0428  Weight: 94.3 kg 88.9 kg 88.2 kg    Examination:  General exam: Deconditioned, debilitated, chronically ill looking Respiratory system: Bilateral expiratory wheezes   cardiovascular system: S1 & S2 heard, RRR. Gastrointestinal system: Abdomen is nondistended, soft and nontender. No organomegaly or masses felt. Normal bowel sounds heard. Central nervous system: Alert and oriented. No focal neurological deficits. Extremities: No edema, no clubbing ,no cyanosis Skin: Scattered purpura    Data Reviewed: I have personally reviewed following labs and imaging studies  CBC: Recent Labs  Lab 03/20/20 1137 03/21/20 0447 03/22/20 0655 03/23/20 0433  WBC 8.0 4.1 7.8 6.5  NEUTROABS 6.3  --   --  5.5  HGB 10.9* 10.3* 9.7* 9.8*  HCT 38.1* 34.8* 32.2* 32.1*  MCV 81.8 79.1* 76.5* 76.6*  PLT 261 282 290 293   Basic Metabolic Panel: Recent Labs  Lab 03/20/20 1137 03/21/20 0447 03/22/20 0655 03/23/20 0433  NA 139 139 140 141  K 4.7 4.5 4.0 3.9  CL 94*  91* 93* 98  CO2 39* 39* 37* 33*  GLUCOSE 100* 176* 183* 151*  BUN 19 23 32* 27*  CREATININE 1.26* 0.94 1.37* 1.08  CALCIUM 9.1 8.9 9.0 9.1  MG 2.2 2.4 2.4  --   PHOS  --  2.5 3.1  --    GFR: Estimated Creatinine Clearance: 72.8 mL/min (by C-G formula based on SCr of 1.08 mg/dL). Liver Function Tests: Recent Labs  Lab 03/20/20 1137  AST 14*  ALT 11  ALKPHOS 122  BILITOT 0.5  PROT 7.0  ALBUMIN 3.7   No results for input(s): LIPASE, AMYLASE in the last 168 hours. No results for input(s): AMMONIA in the last 168 hours. Coagulation Profile: No results for input(s): INR, PROTIME in the last 168 hours. Cardiac Enzymes: No results for input(s): CKTOTAL, CKMB, CKMBINDEX, TROPONINI in the last 168 hours. BNP (last 3 results) No results for input(s): PROBNP in the last 8760 hours. HbA1C: Recent Labs    03/21/20 0447  HGBA1C 7.8*   CBG: Recent Labs  Lab 03/21/20 2101 03/22/20 0747 03/22/20 1142 03/22/20 1619 03/22/20 2114  GLUCAP 96 191* 155* 229* 111*   Lipid Profile: No results for input(s): CHOL, HDL, LDLCALC, TRIG, CHOLHDL, LDLDIRECT in the last 72 hours. Thyroid Function  Tests: No results for input(s): TSH, T4TOTAL, FREET4, T3FREE, THYROIDAB in the last 72 hours. Anemia Panel: Recent Labs    03/22/20 0828  FERRITIN 11*  TIBC 524*  IRON 17*   Sepsis Labs: Recent Labs  Lab 03/20/20 1137 03/20/20 1141 03/20/20 1326 03/20/20 1744  PROCALCITON 0.19  --   --   --   LATICACIDVEN  --  0.9 0.9 1.4    Recent Results (from the past 240 hour(s))  Blood culture (routine x 2)     Status: None (Preliminary result)   Collection Time: 03/20/20 11:41 AM   Specimen: BLOOD  Result Value Ref Range Status   Specimen Description BLOOD RIGHT FOREARM  Final   Special Requests   Final    BOTTLES DRAWN AEROBIC AND ANAEROBIC Blood Culture adequate volume   Culture   Final    NO GROWTH 3 DAYS Performed at [facility], [address] [place], [place] [address]    Report Status PENDING  Incomplete  Blood culture (routine x 2)     Status: None (Preliminary result)   Collection Time: 03/20/20  1:25 PM   Specimen: BLOOD  Result Value Ref Range Status   Specimen Description BLOOD LEFT FOREARM  Final   Special Requests   Final    BOTTLES DRAWN AEROBIC AND ANAEROBIC Blood Culture results may not be optimal due to an inadequate volume of blood received in culture bottles   Culture   Final    NO GROWTH 3 DAYS Performed at [facility], [address] [place], [place] [address]    Report Status PENDING  Incomplete  Resp Panel by RT-PCR (Flu A&B, Covid) Nasopharyngeal Swab     Status: None   Collection Time: 03/20/20  5:44 PM   Specimen: Nasopharyngeal Swab; Nasopharyngeal(NP) swabs in vial transport medium  Result Value Ref Range Status   SARS Coronavirus 2 by RT PCR NEGATIVE NEGATIVE Final    Comment: (NOTE) SARS-CoV-2 target nucleic acids are NOT DETECTED.  The SARS-CoV-2 RNA is generally detectable in upper respiratory specimens during the acute phase of infection. The lowest concentration of SARS-CoV-2 viral copies this assay can detect  is 138 copies/mL. A negative result does not preclude SARS-Cov-2 infection and  should not be used as the sole basis for treatment or other patient management decisions. A negative result may occur with  improper specimen collection/handling, submission of specimen other than nasopharyngeal swab, presence of viral mutation(s) within the areas targeted by this assay, and inadequate number of viral copies(<138 copies/mL). A negative result must be combined with clinical observations, patient history, and epidemiological information. The expected result is Negative.  Fact Sheet for Patients:  BloggerCourse.com  Fact Sheet for Healthcare Providers:  SeriousBroker.it  This test is no t yet approved or cleared by the [place] FDA and  has been authorized for detection and/or diagnosis of SARS-CoV-2 by FDA under an Emergency Use Authorization (EUA). This EUA will remain  in effect (meaning this test can be used) for the duration of the COVID-19 declaration under Section 564(b)(1) of the Act, 21 U.S.C.section 360bbb-3(b)(1), unless the authorization is terminated  or revoked sooner.       Influenza A by PCR NEGATIVE NEGATIVE Final   Influenza B by PCR NEGATIVE NEGATIVE Final    Comment: (NOTE) The Xpert Xpress SARS-CoV-2/FLU/RSV plus assay is intended as an aid in the diagnosis of influenza from Nasopharyngeal swab specimens and should not be used as a sole basis for treatment. Nasal washings and aspirates are unacceptable for Xpert Xpress SARS-CoV-2/FLU/RSV testing.  Fact Sheet for Patients: BloggerCourse.com  Fact Sheet for Healthcare Providers: SeriousBroker.it  This test is not yet approved or cleared by the [place] FDA and has been authorized for detection and/or diagnosis of SARS-CoV-2 by FDA under an Emergency Use Authorization (EUA). This EUA will remain in effect  (meaning this test can be used) for the duration of the COVID-19 declaration under Section 564(b)(1) of the Act, 21 U.S.C. section 360bbb-3(b)(1), unless the authorization is terminated or revoked.  Performed at [facility], [address] [place], [place] [address]   MRSA PCR Screening     Status: None   Collection Time: 03/20/20  6:38 PM   Specimen: Nasal Mucosa; Nasopharyngeal  Result Value Ref Range Status   MRSA by PCR NEGATIVE NEGATIVE Final    Comment:        The GeneXpert MRSA Assay (FDA approved for NASAL specimens only), is one component of a comprehensive MRSA colonization surveillance program. It is not intended to diagnose MRSA infection nor to guide or monitor treatment for MRSA infections. Performed at [facility], [address], [place], [place] [address]          Radiology Studies: No results found.      Scheduled Meds: . amiodarone  200 mg Oral Daily  . atorvastatin  40 mg Oral Daily  . Chlorhexidine Gluconate Cloth  6 each Topical Q0600  . DULoxetine  30 mg Oral Daily  . insulin aspart  0-15 Units Subcutaneous TID WC  . insulin glargine  11 Units Subcutaneous QHS  . ipratropium-albuterol  3 mL Nebulization BID  . lamoTRIgine  100 mg Oral Daily  . methylPREDNISolone (SOLU-MEDROL) injection  40 mg Intravenous Q12H  . pantoprazole  40 mg Oral Daily  . sodium chloride flush  3 mL Intravenous Q12H  . tamsulosin  0.4 mg Oral QPC supper  . traZODone  150 mg Oral QHS   Continuous Infusions: . sodium chloride       LOS: 3 days    Time spent: 35 mins.More than 50% of that time was spent in counseling and/or coordination of care.      Burnadette Pop, MD [facility] P3/15/2022,  7:48 AM

## 2020-03-23 NOTE — Progress Notes (Signed)
PT Cancellation Note  Patient Details Name: Otha Monical MRN: [id number] DOB: 1955-11-21   Cancelled Treatment:    Reason Eval/Treat Not Completed: Pain limiting ability to participate.  Attempted to see pt at 0916 this morning but pt reporting having "too much pain" to participate in therapy.  Nurse notified regarding pt's c/o pain (pt not due for pain meds yet though).  Therapist returned at 1056 (pt received pain medication at 1007) and pt reporting back and neck pain still 8/10 and was only given 5 mg instead of the 10 mg of pain meds that works for him so he was not going to be able to work with therapy today d/t pain issues.  Nurse notified.  Will re-attempt PT evaluation at a later date/time as able.  Hendricks Limes, PT 03/23/20, 11:07 AM

## 2020-03-24 DIAGNOSIS — I2510 Atherosclerotic heart disease of native coronary artery without angina pectoris: Secondary | ICD-10-CM

## 2020-03-24 DIAGNOSIS — E785 Hyperlipidemia, unspecified: Secondary | ICD-10-CM

## 2020-03-24 DIAGNOSIS — F419 Anxiety disorder, unspecified: Secondary | ICD-10-CM

## 2020-03-24 DIAGNOSIS — I10 Essential (primary) hypertension: Secondary | ICD-10-CM

## 2020-03-24 DIAGNOSIS — Z720 Tobacco use: Secondary | ICD-10-CM

## 2020-03-24 LAB — GLUCOSE, CAPILLARY
Glucose-Capillary: 187 mg/dL — ABNORMAL HIGH (ref 70–99)
Glucose-Capillary: 151 mg/dL — ABNORMAL HIGH (ref 70–99)

## 2020-03-24 LAB — BASIC METABOLIC PANEL
Anion gap: 8 (ref 5–15)
BUN: 33 mg/dL — ABNORMAL HIGH (ref 8–23)
CO2: 36 mmol/L — ABNORMAL HIGH (ref 22–32)
Calcium: 8.9 mg/dL (ref 8.9–10.3)
Chloride: 95 mmol/L — ABNORMAL LOW (ref 98–111)
Creatinine, Ser: 1.48 mg/dL — ABNORMAL HIGH (ref 0.61–1.24)
GFR, Estimated: 52 mL/min — ABNORMAL LOW (ref >60)
Glucose, Bld: 270 mg/dL — ABNORMAL HIGH (ref 70–99)
Potassium: 4.2 mmol/L (ref 3.5–5.1)
Sodium: 139 mmol/L (ref 135–145)

## 2020-03-24 MED ORDER — LIDOCAINE 5 % EX PTCH: 1.0000 | MEDICATED_PATCH | CUTANEOUS | 0 refills | Status: DC

## 2020-03-24 MED ORDER — PREDNISONE 10 MG PO TABS
60.0000 mg | ORAL_TABLET | Freq: Every day | ORAL | 0 refills | Status: DC
Start: 2020-03-24 — End: 2020-04-08

## 2020-03-24 NOTE — NC FL2 (Signed)
[place] MEDICAID FL2 LEVEL OF CARE SCREENING TOOL     IDENTIFICATION  Patient Name: Gregory Hall Birthdate: Jan 22, 1955 Sex: male Admission Date (Current Location): 03/20/2020  [place] and [place] Number:  Chiropodist and Address:  [facility], [address], [place], [place] [address]      Provider Number: [id number]  Attending Physician Name and Address:  Calvert Cantor, MD  Relative Name and Phone Number:       Current Level of Care: Hospital Recommended Level of Care: Other (Comment) (Group Home) Prior Approval Number:    Date Approved/Denied:   PASRR Number:    Discharge Plan: Other (Comment) (Group Home)    Current Diagnoses: Patient Active Problem List   Diagnosis Date Noted  . Continuous tobacco abuse 03/20/2020  . AKI (acute kidney injury) (HCC) 02/17/2020  . Diabetes mellitus without complication (HCC)   . Diastolic dysfunction with chronic heart failure (HCC)   . Near syncope   . Acute respiratory failure (HCC) 12/13/2019  . Palliative care by specialist   . DNR (do not resuscitate)   . Acute CHF (congestive heart failure) (HCC) 10/08/2019  . Chronic respiratory failure with hypoxia (HCC) 10/08/2019  . Atrial flutter (HCC)   . Acute on chronic respiratory failure with hypoxia (HCC) 09/13/2019  . Acute diastolic CHF (congestive heart failure) (HCC) 09/12/2019  . NSTEMI (non-ST elevated myocardial infarction) (HCC) 09/12/2019  . Acute kidney failure, unspecified (HCC) 09/12/2019  . Diabetes mellitus type 2, uncontrolled, with complications (HCC) 06/10/2019  . Chronic pain syndrome 06/10/2019  . COPD exacerbation (HCC) 06/10/2019  . COPD with acute exacerbation (HCC) 06/09/2019  . CAD (coronary artery disease) 06/09/2019  . HTN (hypertension) 06/09/2019  . Acute on chronic respiratory failure with hypoxia and hypercapnia (HCC) 06/09/2019  . Anxiety 06/09/2019  . Chronic prescription opiate use 06/09/2019  .  Hyperglycemia 06/09/2019  . Subclavian artery stenosis, left (HCC) 08/20/2014  . Rectus diastasis 07/19/2012  . Hernia   . Edema 05/10/2011  . Goals of care, counseling/discussion 10/06/2010  . SMOKER 11/25/2009  . CAROTID BRUIT, RIGHT 11/24/2009  . Chest pain 11/24/2009  . Hyperlipidemia 03/30/2009  . Coronary atherosclerosis 03/30/2009  . HYPERTENSION, HX OF 03/30/2009    Orientation RESPIRATION BLADDER Height & Weight     Self,Time,Situation,Place  Normal Continent Weight: 196 lb 3.4 oz (89 kg) Height:  6' (182.9 cm)  BEHAVIORAL SYMPTOMS/MOOD NEUROLOGICAL BOWEL NUTRITION STATUS      Continent Diet (carb modified, thin liquids)  AMBULATORY STATUS COMMUNICATION OF NEEDS Skin   Limited Assist Verbally Skin abrasions (open wound on right thigh, foam dressing. open would on left arm foam dressing)                       Personal Care Assistance Level of Assistance  Dressing,Feeding,Bathing Bathing Assistance: Limited assistance Feeding assistance: Independent Dressing Assistance: Limited assistance     Functional Limitations Info  Sight,Hearing,Speech Sight Info: Adequate Hearing Info: Adequate Speech Info: Adequate    SPECIAL CARE FACTORS FREQUENCY  PT (By licensed PT),OT (By licensed OT)     PT Frequency: 2z OT Frequency: 2x            Contractures Contractures Info: Not present    Additional Factors Info  Code Status,Allergies Code Status Info: full code Allergies Info: Acetaminophen, Fentanyl, Nsaids           Current Medications (03/24/2020):  This is the current hospital active medication list Current Facility-Administered Medications  Medication Dose Route Frequency Provider Last Rate Last Admin  . 0.9 %  sodium chloride infusion  250 mL Intravenous PRN Salena Saner, MD      . amiodarone (PACERONE) tablet 200 mg  200 mg Oral Daily Salena Saner, MD   200 mg at 03/24/20 0957  . atorvastatin (LIPITOR) tablet 40 mg  40 mg Oral Daily  Salena Saner, MD   40 mg at 03/24/20 0957  . budesonide (PULMICORT) nebulizer solution 0.5 mg  0.5 mg Nebulization BID Burnadette Pop, MD   0.5 mg at 03/24/20 0734  . docusate sodium (COLACE) capsule 100 mg  100 mg Oral BID PRN Salena Saner, MD      . DULoxetine (CYMBALTA) DR capsule 30 mg  30 mg Oral Daily Burnadette Pop, MD   30 mg at 03/24/20 0957  . insulin aspart (novoLOG) injection 0-15 Units  0-15 Units Subcutaneous TID WC Salena Saner, MD   3 Units at 03/24/20 [phone]  . insulin glargine (LANTUS) injection 11 Units  11 Units Subcutaneous QHS Salena Saner, MD   11 Units at 03/23/20 2140  . ipratropium-albuterol (DUONEB) 0.5-2.5 (3) MG/3ML nebulizer solution 3 mL  3 mL Nebulization Q6H Adhikari, Amrit, MD   3 mL at 03/24/20 0734  . lamoTRIgine (LAMICTAL) tablet 100 mg  100 mg Oral Daily Salena Saner, MD   100 mg at 03/24/20 0958  . lidocaine (LIDODERM) 5 % 1 patch  1 patch Transdermal Q24H Adhikari, Amrit, MD      . methylPREDNISolone sodium succinate (SOLU-MEDROL) 40 mg/mL injection 40 mg  40 mg Intravenous Q12H Salena Saner, MD   40 mg at 03/24/20 0823  . naloxone [facility]) injection 0.4 mg  0.4 mg Intravenous PRN Burnadette Pop, MD      . ondansetron (ZOFRAN) injection 4 mg  4 mg Intravenous Q6H PRN Salena Saner, MD      . oxyCODONE (Oxy IR/ROXICODONE) immediate release tablet 10 mg  10 mg Oral Q6H PRN Burnadette Pop, MD   10 mg at 03/24/20 3007  . pantoprazole (PROTONIX) EC tablet 40 mg  40 mg Oral Daily Salena Saner, MD   40 mg at 03/24/20 0957  . polyethylene glycol (MIRALAX / GLYCOLAX) packet 17 g  17 g Oral Daily PRN Salena Saner, MD      . rivaroxaban Carlena Hurl) tablet 20 mg  20 mg Oral Q supper Burnadette Pop, MD   20 mg at 03/23/20 1645  . sodium chloride flush (NS) 0.9 % injection 3 mL  3 mL Intravenous Q12H Salena Saner, MD   3 mL at 03/24/20 0958  . sodium chloride flush (NS) 0.9 % injection 3 mL  3 mL Intravenous PRN  Salena Saner, MD      . tamsulosin (FLOMAX) capsule 0.4 mg  0.4 mg Oral QPC supper Salena Saner, MD   0.4 mg at 03/23/20 1644  . traZODone (DESYREL) tablet 150 mg  150 mg Oral QHS Salena Saner, MD   150 mg at 03/23/20 2139     Discharge Medications: Please see discharge summary for a list of discharge medications.  Relevant Imaging Results:  Relevant Lab Results:   Additional Information SSN:[id number]  Reuel Boom Minh [place], LCSW

## 2020-03-24 NOTE — Clinical Social Work Note (Signed)
Patient continues to exhibit signs of hypercapnia associated with chronic respiratory failure secondary to severe COPD.  Interruption or failure to provide NIV would quickly lead to exacerbation of the patient's condition, hospital admission, and likely harm to the patient. Continued use is preferred.  The use of the NIV will treat patient's high PC02 levels and can reduce risk of exacerbations and future hospitalizations when used at night and during the day.  BiLevel/RAD has been tried previously and has proven ineffective at managing this patient's hypercapnia.  Ventilation is required to decrease the work of breathing and improve pulmonary status. Interruption of ventilator support would lead to decline of health status.  Patient is able to protect their airways and clear secretions on their own.    

## 2020-03-24 NOTE — TOC Initial Note (Addendum)
Transition of Care [facility]) - Initial/Assessment Note    Patient Details  Name: Gregory Hall MRN: [id number] Date of Birth: 21-Nov-1955  Transition of Care [facility]) CM/SW Contact:    Maree Krabbe, LCSW Phone Number: 03/24/2020, 11:35 AM  Clinical Narrative:  CSW spoke with pt at bedside. Pt states he is from [facility]. Pt is agreeable to return. Pt states Medicaid transport takes him to his doctor appointments. Pt gets his meds from [facility]. Pt's established PCP is Hal Morales. Pt was getting HH provided by [facility] prior to admission. Pt is agreeable to continue the [facility] with [facility] (PT, RN.) Referral sent to [facility] with [facility]. [facility] also set up through [facility].   CSW called Clementeen Hoof [phone]), group home administrator--left voicemail, awaiting call back.            CSW received a call back from administrator and she is prepared for pt. FL2 will be faxed to [phone].        Expected Discharge Plan: Group Home Barriers to Discharge: No Barriers Identified   Patient Goals and CMS Choice        Expected Discharge Plan and Services Expected Discharge Plan: Group Home In-house Referral: Clinical Social Work   Post Acute Care Choice: Home Health Living arrangements for the past 2 months: Group Home Expected Discharge Date: 03/24/20               DME Arranged: Ventilator DME Agency: [facility] Date DME Agency Contacted: 03/24/20 Time DME Agency Contacted: 1135 Representative spoke with at DME Agency: zach HH Arranged: PT,RN HH Agency: [facility] Date HH Agency Contacted: 03/24/20 Time HH Agency Contacted: 1135 Representative spoke with at [facility] Agency: Barbara Cower  Prior Living Arrangements/Services Living arrangements for the past 2 months: Group Home Lives with:: Facility Resident Patient language and need for interpreter reviewed:: Yes Do you feel safe going back to the place where you live?: Yes      Need for Family  Participation in Patient Care: Yes (Comment) Care giver support system in place?: Yes (comment)   Criminal Activity/Legal Involvement Pertinent to Current Situation/Hospitalization: No - Comment as needed  Activities of Daily Living Home Assistive Devices/Equipment: Eyeglasses ADL Screening (condition at time of admission) Patient's cognitive ability adequate to safely complete daily activities?: Yes Is the patient deaf or have difficulty hearing?: No Does the patient have difficulty seeing, even when wearing glasses/contacts?: No Does the patient have difficulty concentrating, remembering, or making decisions?: No Patient able to express need for assistance with ADLs?: Yes Does the patient have difficulty dressing or bathing?: No Independently performs ADLs?: Yes (appropriate for developmental age) Does the patient have difficulty walking or climbing stairs?: No Weakness of Legs: None Weakness of Arms/Hands: None  Permission Sought/Granted         Permission granted to share info w AGENCY: [facility]        Emotional Assessment Appearance:: Appears stated age Attitude/Demeanor/Rapport: Engaged Affect (typically observed): Accepting,Appropriate Orientation: : Oriented to Self,Oriented to Place,Oriented to  Time,Oriented to Situation Alcohol / Substance Use: Not Applicable Psych Involvement: No (comment)  Admission diagnosis:  COPD exacerbation (HCC) [J44.1] Acute respiratory failure with hypoxia and hypercapnia (HCC) [J96.01, J96.02] Acute on chronic respiratory failure with hypoxia and hypercapnia (HCC) [J96.21, J96.22] Acute on chronic congestive heart failure, unspecified heart failure type (HCC) [I50.9] Patient Active Problem List   Diagnosis Date Noted  . Continuous tobacco abuse 03/20/2020  . AKI (acute kidney injury) (HCC) 02/17/2020  .  Diabetes mellitus without complication (HCC)   . Diastolic dysfunction with chronic heart failure (HCC)   . Near syncope   .  Acute respiratory failure (HCC) 12/13/2019  . Palliative care by specialist   . DNR (do not resuscitate)   . Acute CHF (congestive heart failure) (HCC) 10/08/2019  . Chronic respiratory failure with hypoxia (HCC) 10/08/2019  . Atrial flutter (HCC)   . Acute on chronic respiratory failure with hypoxia (HCC) 09/13/2019  . Acute diastolic CHF (congestive heart failure) (HCC) 09/12/2019  . NSTEMI (non-ST elevated myocardial infarction) (HCC) 09/12/2019  . Acute kidney failure, unspecified (HCC) 09/12/2019  . Diabetes mellitus type 2, uncontrolled, with complications (HCC) 06/10/2019  . Chronic pain syndrome 06/10/2019  . COPD exacerbation (HCC) 06/10/2019  . COPD with acute exacerbation (HCC) 06/09/2019  . CAD (coronary artery disease) 06/09/2019  . HTN (hypertension) 06/09/2019  . Acute on chronic respiratory failure with hypoxia and hypercapnia (HCC) 06/09/2019  . Anxiety 06/09/2019  . Chronic prescription opiate use 06/09/2019  . Hyperglycemia 06/09/2019  . Subclavian artery stenosis, left (HCC) 08/20/2014  . Rectus diastasis 07/19/2012  . Hernia   . Edema 05/10/2011  . Goals of care, counseling/discussion 10/06/2010  . SMOKER 11/25/2009  . CAROTID BRUIT, RIGHT 11/24/2009  . Chest pain 11/24/2009  . Hyperlipidemia 03/30/2009  . Coronary atherosclerosis 03/30/2009  . HYPERTENSION, HX OF 03/30/2009   PCP:  Koren Bound, NP Pharmacy:   [facility] - [place], [place] - [address] [place] [address] Phone: [phone] Fax: [phone]     Social Determinants of Health (SDOH) Interventions    Readmission Risk Interventions Readmission Risk Prevention Plan 02/18/2020 12/15/2019 09/24/2019  Transportation Screening Complete Complete Complete  PCP or Specialist Appt within 3-5 Days - - -  HRI or Home Care Consult - - -  Social Work Consult for Recovery Care Planning/Counseling - - -  Palliative Care Screening - - -  Medication Review  Oceanographer) (No Data) Complete Complete  PCP or Specialist appointment within 3-5 days of discharge Complete - Complete  HRI or Home Care Consult Complete Complete Complete  SW Recovery Care/Counseling Consult Complete - -  Palliative Care Screening Not Applicable Not Applicable Complete  Skilled Nursing Facility Not Applicable Not Applicable Complete  Some recent data might be hidden

## 2020-03-24 NOTE — Progress Notes (Addendum)
Palliative:  Mr. Gregory, Hall, is resting quietly in bed.  He greets me making and mostly keeping eye contact.  He appears stable, nontoxic.  He is alert and oriented, able to make his basic needs known.  There is no family at bedside at this time.  We talk about his urological problems.  He shares that he has voided today, and denies issues.  We talked about his pain management.  I encouraged him to work closely with his pain management specialist.  We also talked about CODE STATUS, and I reassure Gregory Hall that we will respect his wishes.  Conference with attending, bedside nursing staff, transition of care team related to patient condition, needs, goals of care, disposition.  Plan: Return to group home with home health.  Outpatient palliative to follow.  Full scope/full code, would accept long-term trach/PEG.    25 minutes Lillia Carmel, NP Palliative medicine team Team phone [phone] Greater than 50% of this time was spent counseling and coordinating care related to the above assessment and plan.

## 2020-03-24 NOTE — TOC Progression Note (Signed)
Transition of Care [facility]) - Progression Note    Patient Details  Name: Gregory Hall MRN: [id number] Date of Birth: [birth year]  Transition [facility] Care [facility]) CM/SW Contact  Maree Krabbe, LCSW Phone Number: 03/24/2020, 12:40 PM  Clinical Narrative:  Referral for [facility] was provided to [facility] however, will take a day or two to get auth through [facility]. Per Ian Malkin with [facility] MD wants to keep pt until auth obtained. DC will not occur today. CSW will notified Group Home.     Expected Discharge Plan: Group Home Barriers to Discharge: No Barriers Identified  Expected Discharge Plan and Services Expected Discharge Plan: Group Home In-house Referral: Clinical Social Work   Post Acute Care Choice: Home Health Living arrangements for the past 2 months: Group Home Expected Discharge Date: 03/24/20               DME Arranged: Ventilator DME Agency: [facility] Date DME Agency Contacted: 03/24/20 Time DME Agency Contacted: 1135 Representative spoke with at DME Agency: zach HH Arranged: PT,RN HH Agency: [facility] Date HH Agency Contacted: 03/24/20 Time HH Agency Contacted: 1135 Representative spoke with at [facility] Agency: Barbara Cower   Social Determinants of Health (SDOH) Interventions    Readmission Risk Interventions Readmission Risk Prevention Plan 02/18/2020 12/15/2019 09/24/2019  Transportation Screening Complete Complete Complete  PCP or Specialist Appt within 3-5 Days - - -  HRI or Home Care Consult - - -  Social Work Consult for Recovery Care Planning/Counseling - - -  Palliative Care Screening - - -  Medication Review Oceanographer) (No Data) Complete Complete  PCP or Specialist appointment within 3-5 days of discharge Complete - Complete  HRI or Home Care Consult Complete Complete Complete  SW Recovery Care/Counseling Consult Complete - -  Palliative Care Screening Not Applicable Not Applicable Complete  Skilled Nursing Facility Not Applicable Not  Applicable Complete  Some recent data might be hidden

## 2020-03-24 NOTE — Plan of Care (Signed)
Problem: Education: Goal: Knowledge of General Education information will improve Description: Including pain rating scale, medication(s)/side effects and non-pharmacologic comfort measures 03/24/2020 1202 by Ansel Bong, RN Outcome: Progressing 03/24/2020 1202 by Ansel Bong, RN Outcome: Progressing   Problem: Health Behavior/Discharge Planning: Goal: Ability to manage health-related needs will improve 03/24/2020 1202 by Ansel Bong, RN Outcome: Progressing 03/24/2020 1202 by Ansel Bong, RN Outcome: Progressing   Problem: Clinical Measurements: Goal: Ability to maintain clinical measurements within normal limits will improve 03/24/2020 1202 by Ansel Bong, RN Outcome: Progressing 03/24/2020 1202 by Ansel Bong, RN Outcome: Progressing Goal: Will remain free from infection 03/24/2020 1202 by Ansel Bong, RN Outcome: Progressing 03/24/2020 1202 by Ansel Bong, RN Outcome: Progressing Goal: Diagnostic test results will improve 03/24/2020 1202 by Ansel Bong, RN Outcome: Progressing 03/24/2020 1202 by Ansel Bong, RN Outcome: Progressing Goal: Respiratory complications will improve 03/24/2020 1202 by Ansel Bong, RN Outcome: Progressing 03/24/2020 1202 by Ansel Bong, RN Outcome: Progressing Goal: Cardiovascular complication will be avoided 03/24/2020 1202 by Ansel Bong, RN Outcome: Progressing 03/24/2020 1202 by Ansel Bong, RN Outcome: Progressing   Problem: Activity: Goal: Risk for activity intolerance will decrease 03/24/2020 1202 by Ansel Bong, RN Outcome: Progressing 03/24/2020 1202 by Ansel Bong, RN Outcome: Progressing   Problem: Nutrition: Goal: Adequate nutrition will be maintained 03/24/2020 1202 by Ansel Bong, RN Outcome: Progressing 03/24/2020 1202 by Ansel Bong, RN Outcome: Progressing   Problem: Coping: Goal: Level of anxiety will decrease 03/24/2020 1202 by Ansel Bong, RN Outcome:  Progressing 03/24/2020 1202 by Ansel Bong, RN Outcome: Progressing   Problem: Elimination: Goal: Will not experience complications related to bowel motility 03/24/2020 1202 by Ansel Bong, RN Outcome: Progressing 03/24/2020 1202 by Ansel Bong, RN Outcome: Progressing Goal: Will not experience complications related to urinary retention 03/24/2020 1202 by Ansel Bong, RN Outcome: Progressing 03/24/2020 1202 by Ansel Bong, RN Outcome: Progressing   Problem: Pain Managment: Goal: General experience of comfort will improve 03/24/2020 1202 by Ansel Bong, RN Outcome: Progressing 03/24/2020 1202 by Ansel Bong, RN Outcome: Progressing   Problem: Safety: Goal: Ability to remain free from injury will improve 03/24/2020 1202 by Ansel Bong, RN Outcome: Progressing 03/24/2020 1202 by Ansel Bong, RN Outcome: Progressing   Problem: Skin Integrity: Goal: Risk for impaired skin integrity will decrease 03/24/2020 1202 by Ansel Bong, RN Outcome: Progressing 03/24/2020 1202 by Ansel Bong, RN Outcome: Progressing   Problem: Education: Goal: Knowledge of disease or condition will improve 03/24/2020 1202 by Ansel Bong, RN Outcome: Progressing 03/24/2020 1202 by Ansel Bong, RN Outcome: Progressing Goal: Knowledge of the prescribed therapeutic regimen will improve 03/24/2020 1202 by Ansel Bong, RN Outcome: Progressing 03/24/2020 1202 by Ansel Bong, RN Outcome: Progressing Goal: Individualized Educational Video(s) 03/24/2020 1202 by Ansel Bong, RN Outcome: Progressing 03/24/2020 1202 by Ansel Bong, RN Outcome: Progressing   Problem: Activity: Goal: Ability to tolerate increased activity will improve 03/24/2020 1202 by Ansel Bong, RN Outcome: Progressing 03/24/2020 1202 by Ansel Bong, RN Outcome: Progressing Goal: Will verbalize the importance of balancing activity with adequate rest periods 03/24/2020 1202 by Ansel Bong,  RN Outcome: Progressing 03/24/2020 1202 by Ansel Bong, RN Outcome: Progressing   Problem: Respiratory: Goal: Ability to maintain a clear airway will improve 03/24/2020 1202 by Ansel Bong, RN Outcome: Progressing 03/24/2020 1202 by Ansel Bong, RN Outcome: Progressing Goal: Levels of oxygenation will improve 03/24/2020 1202 by Ansel Bong, RN Outcome: Progressing 03/24/2020 1202 by Ansel Bong, RN Outcome: Progressing Goal: Ability to maintain adequate ventilation will improve 03/24/2020 1202 by Ansel Bong,  RN Outcome: Progressing 03/24/2020 1202 by Ansel Bong, RN Outcome: Progressing

## 2020-03-24 NOTE — Progress Notes (Signed)
Spirometry with graph performed and hard copy placed on chart.

## 2020-03-24 NOTE — Discharge Summary (Addendum)
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   Special Requests   Final    BOTTLES DRAWN AEROBIC AND ANAEROBIC Blood Culture adequate volume   Culture   Final    NO GROWTH 4 DAYS Performed at [facility], [address], [place], [place] [address]    Report Status PENDING  Incomplete  Blood culture (routine x 2)     Status: None (Preliminary result)   Collection Time: 03/20/20  1:25 PM   Specimen: BLOOD  Result Value Ref Range Status   Specimen Description BLOOD LEFT FOREARM  Final   Special Requests   Final    BOTTLES DRAWN AEROBIC AND ANAEROBIC Blood Culture results may not be optimal due to an  inadequate volume of blood received in culture bottles   Culture   Final    NO GROWTH 4 DAYS Performed at [facility], [address] [place], [place] [address]    Report Status PENDING  Incomplete  Resp Panel by RT-PCR (Flu A&B, Covid) Nasopharyngeal Swab     Status: None   Collection Time: 03/20/20  5:44 PM   Specimen: Nasopharyngeal Swab; Nasopharyngeal(NP) swabs in vial transport medium  Result Value Ref Range Status   SARS Coronavirus 2 by RT PCR NEGATIVE NEGATIVE Final    Comment: (NOTE) SARS-CoV-2 target nucleic acids are NOT DETECTED.  The SARS-CoV-2 RNA is generally detectable in upper respiratory specimens during the acute phase of infection. The lowest concentration of SARS-CoV-2 viral copies this assay can detect is 138 copies/mL. A negative result does not preclude SARS-Cov-2 infection and should not be used as the sole basis for treatment or other patient management decisions. A negative result may occur with  improper specimen collection/handling,  submission of specimen other than nasopharyngeal swab, presence of viral mutation(s) within the areas targeted by this assay, and inadequate number of viral copies(<138 copies/mL). A negative result must be combined with clinical observations, patient history, and epidemiological information. The expected result is Negative.  Fact Sheet for Patients:  BloggerCourse.com  Fact Sheet for Healthcare Providers:  SeriousBroker.it  This test is no t yet approved or cleared by the [place] FDA and  has been authorized for detection and/or diagnosis of SARS-CoV-2 by FDA under an Emergency Use Authorization (EUA). This EUA will remain  in effect (meaning this test can be used) for the duration of the COVID-19 declaration under Section 564(b)(1) of the Act, 21 U.S.C.section 360bbb-3(b)(1), unless the authorization is terminated  or revoked sooner.        Influenza A by PCR NEGATIVE NEGATIVE Final   Influenza B by PCR NEGATIVE NEGATIVE Final    Comment: (NOTE) The Xpert Xpress SARS-CoV-2/FLU/RSV plus assay is intended as an aid in the diagnosis of influenza from Nasopharyngeal swab specimens and should not be used as a sole basis for treatment. Nasal washings and aspirates are unacceptable for Xpert Xpress SARS-CoV-2/FLU/RSV testing.  Fact Sheet for Patients: BloggerCourse.com  Fact Sheet for Healthcare Providers: SeriousBroker.it  This test is not yet approved or cleared by the [place] FDA and has been authorized for detection and/or diagnosis of SARS-CoV-2 by FDA under an Emergency Use Authorization (EUA). This EUA will remain in effect (meaning this test can be used) for the duration of the COVID-19 declaration under Section 564(b)(1) of the Act, 21 U.S.C. section 360bbb-3(b)(1), unless the authorization is terminated or revoked.  Performed at [facility], [address] [place], [place] [address]   MRSA PCR Screening     Status: None   Collection Time: 03/20/20  6:38 PM   Specimen: Nasal Mucosa; Nasopharyngeal  Result Value Ref Range Status   MRSA by PCR NEGATIVE NEGATIVE Final    Comment:        The GeneXpert MRSA Assay (FDA approved for NASAL specimens only), is one component of a comprehensive MRSA colonization surveillance program. It is not intended to diagnose MRSA infection nor to guide or monitor treatment for MRSA infections. Performed at [facility], [address] [place], [place] [address]      Time coordinating discharge in minutes: 65  SIGNED:   Calvert Cantor, MD  [facility] 03/24/2020, 11:58 AM

## 2020-03-24 NOTE — Progress Notes (Addendum)
Patient decided to leave Against Medical Advice despite being educated otherwise. Dr. Butler Denmark made aware. Pt's PIV removed. Cannula intact. Pt tolerated well. All belongings packed and in tow. Pt says taxi will pick him up for departure.

## 2020-03-24 NOTE — Progress Notes (Signed)
This RN provided discharge instructions and teaching to the patient. The patient verbalized and demonstrated understanding of the provided instructions. All outstanding questions resolved. Pt made group home aware of imminent arrival. L arm PIV removed. Cannula intact. Pt tolerated well. Keda, CNA assisting patient with getting dressed. All belongings packed and in tow. Pt waiting for transport to return to group home.

## 2020-03-25 LAB — CULTURE, BLOOD (ROUTINE X 2)
Culture: NO GROWTH
Culture: NO GROWTH
Special Requests: ADEQUATE

## 2020-03-31 LAB — FUNGUS CULTURE RESULT

## 2020-03-31 LAB — FUNGUS CULTURE WITH STAIN

## 2020-03-31 LAB — FUNGAL ORGANISM REFLEX

## 2020-04-05 LAB — ACID FAST CULTURE WITH REFLEXED SENSITIVITIES (MYCOBACTERIA): Acid Fast Culture: NEGATIVE

## 2020-04-07 ENCOUNTER — Emergency Department: Payer: Medicare Other

## 2020-04-07 ENCOUNTER — Inpatient Hospital Stay
Admission: EM | Admit: 2020-04-07 | Discharge: 2020-04-10 | DRG: 091 | Disposition: A | Discharge status: Home Health | Payer: Medicare Other | Attending: Internal Medicine | Admitting: Internal Medicine

## 2020-04-07 DIAGNOSIS — Z79899 Other long term (current) drug therapy: Secondary | ICD-10-CM

## 2020-04-07 DIAGNOSIS — E876 Hypokalemia: Secondary | ICD-10-CM | POA: Diagnosis present

## 2020-04-07 DIAGNOSIS — R0689 Other abnormalities of breathing: Secondary | ICD-10-CM

## 2020-04-07 DIAGNOSIS — G894 Chronic pain syndrome: Secondary | ICD-10-CM | POA: Diagnosis present

## 2020-04-07 DIAGNOSIS — Z7952 Long term (current) use of systemic steroids: Secondary | ICD-10-CM

## 2020-04-07 DIAGNOSIS — I10 Essential (primary) hypertension: Secondary | ICD-10-CM | POA: Diagnosis present

## 2020-04-07 DIAGNOSIS — I2510 Atherosclerotic heart disease of native coronary artery without angina pectoris: Secondary | ICD-10-CM | POA: Diagnosis present

## 2020-04-07 DIAGNOSIS — N179 Acute kidney failure, unspecified: Secondary | ICD-10-CM | POA: Diagnosis present

## 2020-04-07 DIAGNOSIS — Z885 Allergy status to narcotic agent status: Secondary | ICD-10-CM

## 2020-04-07 DIAGNOSIS — Z886 Allergy status to analgesic agent status: Secondary | ICD-10-CM

## 2020-04-07 DIAGNOSIS — R338 Other retention of urine: Secondary | ICD-10-CM | POA: Diagnosis present

## 2020-04-07 DIAGNOSIS — Z79891 Long term (current) use of opiate analgesic: Secondary | ICD-10-CM

## 2020-04-07 DIAGNOSIS — E785 Hyperlipidemia, unspecified: Secondary | ICD-10-CM | POA: Diagnosis present

## 2020-04-07 DIAGNOSIS — N401 Enlarged prostate with lower urinary tract symptoms: Secondary | ICD-10-CM | POA: Diagnosis present

## 2020-04-07 DIAGNOSIS — F17210 Nicotine dependence, cigarettes, uncomplicated: Secondary | ICD-10-CM | POA: Diagnosis present

## 2020-04-07 DIAGNOSIS — N1831 Chronic kidney disease, stage 3a: Secondary | ICD-10-CM | POA: Diagnosis present

## 2020-04-07 DIAGNOSIS — Z8614 Personal history of Methicillin resistant Staphylococcus aureus infection: Secondary | ICD-10-CM

## 2020-04-07 DIAGNOSIS — E1165 Type 2 diabetes mellitus with hyperglycemia: Secondary | ICD-10-CM | POA: Diagnosis present

## 2020-04-07 DIAGNOSIS — F17200 Nicotine dependence, unspecified, uncomplicated: Secondary | ICD-10-CM | POA: Diagnosis present

## 2020-04-07 DIAGNOSIS — G629 Polyneuropathy, unspecified: Secondary | ICD-10-CM | POA: Diagnosis present

## 2020-04-07 DIAGNOSIS — G928 Other toxic encephalopathy: Secondary | ICD-10-CM | POA: Diagnosis not present

## 2020-04-07 DIAGNOSIS — J9622 Acute and chronic respiratory failure with hypercapnia: Secondary | ICD-10-CM | POA: Diagnosis present

## 2020-04-07 DIAGNOSIS — S72009A Fracture of unspecified part of neck of unspecified femur, initial encounter for closed fracture: Secondary | ICD-10-CM

## 2020-04-07 DIAGNOSIS — Z20822 Contact with and (suspected) exposure to covid-19: Secondary | ICD-10-CM | POA: Diagnosis present

## 2020-04-07 DIAGNOSIS — T402X5A Adverse effect of other opioids, initial encounter: Secondary | ICD-10-CM | POA: Diagnosis present

## 2020-04-07 DIAGNOSIS — IMO0002 Reserved for concepts with insufficient information to code with codable children: Secondary | ICD-10-CM | POA: Diagnosis present

## 2020-04-07 DIAGNOSIS — I5032 Chronic diastolic (congestive) heart failure: Secondary | ICD-10-CM | POA: Diagnosis present

## 2020-04-07 DIAGNOSIS — E1122 Type 2 diabetes mellitus with diabetic chronic kidney disease: Secondary | ICD-10-CM | POA: Diagnosis present

## 2020-04-07 DIAGNOSIS — Z9119 Patient's noncompliance with other medical treatment and regimen: Secondary | ICD-10-CM

## 2020-04-07 DIAGNOSIS — I130 Hypertensive heart and chronic kidney disease with heart failure and stage 1 through stage 4 chronic kidney disease, or unspecified chronic kidney disease: Secondary | ICD-10-CM | POA: Diagnosis present

## 2020-04-07 DIAGNOSIS — K219 Gastro-esophageal reflux disease without esophagitis: Secondary | ICD-10-CM | POA: Diagnosis present

## 2020-04-07 DIAGNOSIS — J441 Chronic obstructive pulmonary disease with (acute) exacerbation: Secondary | ICD-10-CM | POA: Diagnosis present

## 2020-04-07 DIAGNOSIS — I9589 Other hypotension: Secondary | ICD-10-CM | POA: Diagnosis present

## 2020-04-07 DIAGNOSIS — Z955 Presence of coronary angioplasty implant and graft: Secondary | ICD-10-CM

## 2020-04-07 DIAGNOSIS — J9621 Acute and chronic respiratory failure with hypoxia: Secondary | ICD-10-CM | POA: Diagnosis present

## 2020-04-07 DIAGNOSIS — Z794 Long term (current) use of insulin: Secondary | ICD-10-CM

## 2020-04-07 DIAGNOSIS — R9431 Abnormal electrocardiogram [ECG] [EKG]: Secondary | ICD-10-CM

## 2020-04-07 DIAGNOSIS — Z888 Allergy status to other drugs, medicaments and biological substances status: Secondary | ICD-10-CM

## 2020-04-07 DIAGNOSIS — Z9981 Dependence on supplemental oxygen: Secondary | ICD-10-CM

## 2020-04-07 DIAGNOSIS — I480 Paroxysmal atrial fibrillation: Secondary | ICD-10-CM | POA: Diagnosis present

## 2020-04-07 DIAGNOSIS — R4182 Altered mental status, unspecified: Secondary | ICD-10-CM

## 2020-04-07 DIAGNOSIS — D631 Anemia in chronic kidney disease: Secondary | ICD-10-CM | POA: Diagnosis present

## 2020-04-07 DIAGNOSIS — Z7951 Long term (current) use of inhaled steroids: Secondary | ICD-10-CM

## 2020-04-07 DIAGNOSIS — G9341 Metabolic encephalopathy: Secondary | ICD-10-CM | POA: Diagnosis present

## 2020-04-07 MED ORDER — NALOXONE HCL 2 MG/2ML IJ SOSY
0.4000 mg | PREFILLED_SYRINGE | Freq: Once | INTRAMUSCULAR | Status: AC
  Administered 2020-04-07: 0.4 mg via INTRAVENOUS
  Filled 2020-04-07: qty 2

## 2020-04-07 MED ORDER — SODIUM CHLORIDE 0.9 % IV BOLUS
1000.0000 mL | Freq: Once | INTRAVENOUS | Status: AC
  Administered 2020-04-08: 1000 mL via INTRAVENOUS

## 2020-04-07 NOTE — ED Notes (Signed)
Pt back from CT
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
## 2020-04-07 NOTE — ED Notes (Signed)
Pt to CT with this RN.

## 2020-04-07 NOTE — ED Notes (Signed)
Pt more awake now after narcan administration. Eyes open. Answering questions. Dolores Frame MD made aware

## 2020-04-07 NOTE — ED Notes (Signed)
XR at bedside

## 2020-04-08 ENCOUNTER — Encounter: Payer: Self-pay | Admitting: Internal Medicine

## 2020-04-08 ENCOUNTER — Inpatient Hospital Stay: Payer: Medicare Other

## 2020-04-08 DIAGNOSIS — R4182 Altered mental status, unspecified: Secondary | ICD-10-CM | POA: Diagnosis present

## 2020-04-08 DIAGNOSIS — I5032 Chronic diastolic (congestive) heart failure: Secondary | ICD-10-CM | POA: Diagnosis present

## 2020-04-08 DIAGNOSIS — I9589 Other hypotension: Secondary | ICD-10-CM | POA: Diagnosis present

## 2020-04-08 DIAGNOSIS — G9341 Metabolic encephalopathy: Secondary | ICD-10-CM | POA: Diagnosis not present

## 2020-04-08 DIAGNOSIS — J441 Chronic obstructive pulmonary disease with (acute) exacerbation: Secondary | ICD-10-CM | POA: Diagnosis present

## 2020-04-08 DIAGNOSIS — E1122 Type 2 diabetes mellitus with diabetic chronic kidney disease: Secondary | ICD-10-CM | POA: Diagnosis present

## 2020-04-08 DIAGNOSIS — T402X5A Adverse effect of other opioids, initial encounter: Secondary | ICD-10-CM | POA: Diagnosis present

## 2020-04-08 DIAGNOSIS — D631 Anemia in chronic kidney disease: Secondary | ICD-10-CM | POA: Diagnosis present

## 2020-04-08 DIAGNOSIS — R9431 Abnormal electrocardiogram [ECG] [EKG]: Secondary | ICD-10-CM

## 2020-04-08 DIAGNOSIS — S72009A Fracture of unspecified part of neck of unspecified femur, initial encounter for closed fracture: Secondary | ICD-10-CM | POA: Diagnosis present

## 2020-04-08 DIAGNOSIS — J9621 Acute and chronic respiratory failure with hypoxia: Secondary | ICD-10-CM | POA: Diagnosis present

## 2020-04-08 DIAGNOSIS — I130 Hypertensive heart and chronic kidney disease with heart failure and stage 1 through stage 4 chronic kidney disease, or unspecified chronic kidney disease: Secondary | ICD-10-CM | POA: Diagnosis present

## 2020-04-08 DIAGNOSIS — E1165 Type 2 diabetes mellitus with hyperglycemia: Secondary | ICD-10-CM

## 2020-04-08 DIAGNOSIS — J9622 Acute and chronic respiratory failure with hypercapnia: Secondary | ICD-10-CM | POA: Diagnosis present

## 2020-04-08 DIAGNOSIS — I10 Essential (primary) hypertension: Secondary | ICD-10-CM

## 2020-04-08 DIAGNOSIS — Z20822 Contact with and (suspected) exposure to covid-19: Secondary | ICD-10-CM | POA: Diagnosis present

## 2020-04-08 DIAGNOSIS — N401 Enlarged prostate with lower urinary tract symptoms: Secondary | ICD-10-CM | POA: Diagnosis present

## 2020-04-08 DIAGNOSIS — E118 Type 2 diabetes mellitus with unspecified complications: Secondary | ICD-10-CM | POA: Diagnosis not present

## 2020-04-08 DIAGNOSIS — E876 Hypokalemia: Secondary | ICD-10-CM | POA: Diagnosis present

## 2020-04-08 DIAGNOSIS — I480 Paroxysmal atrial fibrillation: Secondary | ICD-10-CM | POA: Diagnosis present

## 2020-04-08 DIAGNOSIS — G629 Polyneuropathy, unspecified: Secondary | ICD-10-CM | POA: Diagnosis present

## 2020-04-08 DIAGNOSIS — E785 Hyperlipidemia, unspecified: Secondary | ICD-10-CM | POA: Diagnosis present

## 2020-04-08 DIAGNOSIS — N179 Acute kidney failure, unspecified: Secondary | ICD-10-CM | POA: Diagnosis present

## 2020-04-08 DIAGNOSIS — G894 Chronic pain syndrome: Secondary | ICD-10-CM | POA: Diagnosis present

## 2020-04-08 DIAGNOSIS — G928 Other toxic encephalopathy: Secondary | ICD-10-CM | POA: Diagnosis present

## 2020-04-08 DIAGNOSIS — R338 Other retention of urine: Secondary | ICD-10-CM | POA: Diagnosis present

## 2020-04-08 DIAGNOSIS — K219 Gastro-esophageal reflux disease without esophagitis: Secondary | ICD-10-CM | POA: Diagnosis present

## 2020-04-08 DIAGNOSIS — N1831 Chronic kidney disease, stage 3a: Secondary | ICD-10-CM | POA: Diagnosis present

## 2020-04-08 DIAGNOSIS — I2510 Atherosclerotic heart disease of native coronary artery without angina pectoris: Secondary | ICD-10-CM | POA: Diagnosis present

## 2020-04-08 DIAGNOSIS — F17210 Nicotine dependence, cigarettes, uncomplicated: Secondary | ICD-10-CM | POA: Diagnosis present

## 2020-04-08 LAB — CBC WITH DIFFERENTIAL/PLATELET
Abs Immature Granulocytes: 0.03 10*3/uL (ref 0.00–0.07)
Basophils Absolute: 0 10*3/uL (ref 0.0–0.1)
Basophils Relative: 0 %
Eosinophils Absolute: 0.1 10*3/uL (ref 0.0–0.5)
Eosinophils Relative: 1 %
HCT: 37.2 % — ABNORMAL LOW (ref 39.0–52.0)
Hemoglobin: 11.1 g/dL — ABNORMAL LOW (ref 13.0–17.0)
Immature Granulocytes: 0 %
Lymphocytes Relative: 13 %
Lymphs Abs: 1.2 10*3/uL (ref 0.7–4.0)
MCH: 24.8 pg — ABNORMAL LOW (ref 26.0–34.0)
MCHC: 29.8 g/dL — ABNORMAL LOW (ref 30.0–36.0)
MCV: 83 fL (ref 80.0–100.0)
Monocytes Absolute: 1.1 10*3/uL — ABNORMAL HIGH (ref 0.1–1.0)
Monocytes Relative: 12 %
Neutro Abs: 6.4 10*3/uL (ref 1.7–7.7)
Neutrophils Relative %: 74 %
Platelets: 239 10*3/uL (ref 150–400)
RBC: 4.48 MIL/uL (ref 4.22–5.81)
RDW: 24.9 % — ABNORMAL HIGH (ref 11.5–15.5)
Smear Review: NORMAL
WBC: 8.8 10*3/uL (ref 4.0–10.5)
nRBC: 0 % (ref 0.0–0.2)

## 2020-04-08 LAB — COMPREHENSIVE METABOLIC PANEL
ALT: 13 U/L (ref 0–44)
ALT: 14 U/L (ref 0–44)
AST: 14 U/L — ABNORMAL LOW (ref 15–41)
AST: 15 U/L (ref 15–41)
Albumin: 3.1 g/dL — ABNORMAL LOW (ref 3.5–5.0)
Albumin: 3.3 g/dL — ABNORMAL LOW (ref 3.5–5.0)
Alkaline Phosphatase: 107 U/L (ref 38–126)
Alkaline Phosphatase: 112 U/L (ref 38–126)
Anion gap: 12 (ref 5–15)
Anion gap: 13 (ref 5–15)
BUN: 30 mg/dL — ABNORMAL HIGH (ref 8–23)
BUN: 33 mg/dL — ABNORMAL HIGH (ref 8–23)
CO2: 36 mmol/L — ABNORMAL HIGH (ref 22–32)
CO2: 38 mmol/L — ABNORMAL HIGH (ref 22–32)
Calcium: 8 mg/dL — ABNORMAL LOW (ref 8.9–10.3)
Calcium: 8.6 mg/dL — ABNORMAL LOW (ref 8.9–10.3)
Chloride: 86 mmol/L — ABNORMAL LOW (ref 98–111)
Chloride: 92 mmol/L — ABNORMAL LOW (ref 98–111)
Creatinine, Ser: 1.78 mg/dL — ABNORMAL HIGH (ref 0.61–1.24)
Creatinine, Ser: 1.97 mg/dL — ABNORMAL HIGH (ref 0.61–1.24)
GFR, Estimated: 37 mL/min — ABNORMAL LOW (ref >60)
GFR, Estimated: 41 mL/min — ABNORMAL LOW (ref >60)
Glucose, Bld: 134 mg/dL — ABNORMAL HIGH (ref 70–99)
Glucose, Bld: 151 mg/dL — ABNORMAL HIGH (ref 70–99)
Potassium: 2.9 mmol/L — ABNORMAL LOW (ref 3.5–5.1)
Potassium: 3.1 mmol/L — ABNORMAL LOW (ref 3.5–5.1)
Sodium: 137 mmol/L (ref 135–145)
Sodium: 140 mmol/L (ref 135–145)
Total Bilirubin: 0.5 mg/dL (ref 0.3–1.2)
Total Bilirubin: 0.7 mg/dL (ref 0.3–1.2)
Total Protein: 5.5 g/dL — ABNORMAL LOW (ref 6.5–8.1)
Total Protein: 6.4 g/dL — ABNORMAL LOW (ref 6.5–8.1)

## 2020-04-08 LAB — URINE DRUG SCREEN, QUALITATIVE (ARMC ONLY)
Amphetamines, Ur Screen: NOT DETECTED
Barbiturates, Ur Screen: NOT DETECTED
Benzodiazepine, Ur Scrn: NOT DETECTED
Cannabinoid 50 Ng, Ur ~~LOC~~: NOT DETECTED
Cocaine Metabolite,Ur ~~LOC~~: NOT DETECTED
MDMA (Ecstasy)Ur Screen: NOT DETECTED
Methadone Scn, Ur: NOT DETECTED
Opiate, Ur Screen: POSITIVE — AB
Phencyclidine (PCP) Ur S: NOT DETECTED
Tricyclic, Ur Screen: NOT DETECTED

## 2020-04-08 LAB — URINALYSIS, COMPLETE (UACMP) WITH MICROSCOPIC
Bacteria, UA: NONE SEEN
Bilirubin Urine: NEGATIVE
Glucose, UA: NEGATIVE mg/dL
Hgb urine dipstick: NEGATIVE
Ketones, ur: NEGATIVE mg/dL
Leukocytes,Ua: NEGATIVE
Nitrite: NEGATIVE
Protein, ur: NEGATIVE mg/dL
Specific Gravity, Urine: 1.01 (ref 1.005–1.030)
Squamous Epithelial / HPF: NONE SEEN (ref 0–5)
pH: 5 (ref 5.0–8.0)

## 2020-04-08 LAB — BRAIN NATRIURETIC PEPTIDE: B Natriuretic Peptide: 26.4 pg/mL (ref 0.0–100.0)

## 2020-04-08 LAB — TROPONIN I (HIGH SENSITIVITY)
Troponin I (High Sensitivity): 7 ng/L (ref <18)
Troponin I (High Sensitivity): 7 ng/L (ref <18)

## 2020-04-08 LAB — CBC
HCT: 34.4 % — ABNORMAL LOW (ref 39.0–52.0)
Hemoglobin: 10.3 g/dL — ABNORMAL LOW (ref 13.0–17.0)
MCH: 24.9 pg — ABNORMAL LOW (ref 26.0–34.0)
MCHC: 29.9 g/dL — ABNORMAL LOW (ref 30.0–36.0)
MCV: 83.1 fL (ref 80.0–100.0)
Platelets: 207 10*3/uL (ref 150–400)
RBC: 4.14 MIL/uL — ABNORMAL LOW (ref 4.22–5.81)
RDW: 25.2 % — ABNORMAL HIGH (ref 11.5–15.5)
WBC: 8.7 10*3/uL (ref 4.0–10.5)
nRBC: 0 % (ref 0.0–0.2)

## 2020-04-08 LAB — BLOOD GAS, ARTERIAL
Acid-Base Excess: 16.2 mmol/L — ABNORMAL HIGH (ref 0.0–2.0)
Bicarbonate: 43.5 mmol/L — ABNORMAL HIGH (ref 20.0–28.0)
O2 Saturation: 93.4 %
Patient temperature: 37
pCO2 arterial: 67 mmHg (ref 32.0–48.0)
pH, Arterial: 7.42 (ref 7.350–7.450)
pO2, Arterial: 67 mmHg — ABNORMAL LOW (ref 83.0–108.0)

## 2020-04-08 LAB — LIPASE, BLOOD: Lipase: 22 U/L (ref 11–51)

## 2020-04-08 LAB — PROTIME-INR
INR: 1.3 — ABNORMAL HIGH (ref 0.8–1.2)
Prothrombin Time: 15.5 seconds — ABNORMAL HIGH (ref 11.4–15.2)

## 2020-04-08 LAB — RESP PANEL BY RT-PCR (FLU A&B, COVID) ARPGX2
Influenza A by PCR: NEGATIVE
Influenza B by PCR: NEGATIVE
SARS Coronavirus 2 by RT PCR: NEGATIVE

## 2020-04-08 LAB — ETHANOL: Alcohol, Ethyl (B): <10 mg/dL (ref <10)

## 2020-04-08 LAB — AMMONIA: Ammonia: 25 umol/L (ref 9–35)

## 2020-04-08 LAB — CBG MONITORING, ED
Glucose-Capillary: 139 mg/dL — ABNORMAL HIGH (ref 70–99)
Glucose-Capillary: 217 mg/dL — ABNORMAL HIGH (ref 70–99)
Glucose-Capillary: 107 mg/dL — ABNORMAL HIGH (ref 70–99)
Glucose-Capillary: 148 mg/dL — ABNORMAL HIGH (ref 70–99)
Glucose-Capillary: 111 mg/dL — ABNORMAL HIGH (ref 70–99)

## 2020-04-08 LAB — MAGNESIUM: Magnesium: 1.9 mg/dL (ref 1.7–2.4)

## 2020-04-08 LAB — LACTIC ACID, PLASMA
Lactic Acid, Venous: 1 mmol/L (ref 0.5–1.9)
Lactic Acid, Venous: 1.1 mmol/L (ref 0.5–1.9)

## 2020-04-08 LAB — PROCALCITONIN: Procalcitonin: 0.33 ng/mL

## 2020-04-08 MED ORDER — NICOTINE 14 MG/24HR TD PT24
14.0000 mg | MEDICATED_PATCH | Freq: Every day | TRANSDERMAL | Status: DC
  Administered 2020-04-08: 14 mg via TRANSDERMAL
  Filled 2020-04-08 (×2): qty 1

## 2020-04-08 MED ORDER — RIVAROXABAN 20 MG PO TABS
20.0000 mg | ORAL_TABLET | Freq: Every day | ORAL | Status: DC
  Administered 2020-04-08 – 2020-04-10 (×3): 20 mg via ORAL
  Filled 2020-04-08 (×3): qty 1

## 2020-04-08 MED ORDER — KETOROLAC TROMETHAMINE 30 MG/ML IJ SOLN
15.0000 mg | Freq: Four times a day (QID) | INTRAMUSCULAR | Status: AC | PRN
  Filled 2020-04-08: qty 1

## 2020-04-08 MED ORDER — GLYCOPYRROLATE 1 MG PO TABS
1.0000 mg | ORAL_TABLET | Freq: Three times a day (TID) | ORAL | Status: DC
  Administered 2020-04-08 – 2020-04-10 (×8): 1 mg via ORAL
  Filled 2020-04-08 (×10): qty 1

## 2020-04-08 MED ORDER — BUDESONIDE 0.5 MG/2ML IN SUSP
0.5000 mg | Freq: Two times a day (BID) | RESPIRATORY_TRACT | Status: DC
  Administered 2020-04-08 – 2020-04-10 (×6): 0.5 mg via RESPIRATORY_TRACT
  Filled 2020-04-08 (×6): qty 2

## 2020-04-08 MED ORDER — PREGABALIN 75 MG PO CAPS
150.0000 mg | ORAL_CAPSULE | Freq: Two times a day (BID) | ORAL | Status: DC
  Administered 2020-04-08 – 2020-04-10 (×4): 150 mg via ORAL
  Filled 2020-04-08 (×4): qty 2

## 2020-04-08 MED ORDER — FLUTICASONE FUROATE-VILANTEROL 100-25 MCG/INH IN AEPB
1.0000 | INHALATION_SPRAY | Freq: Every day | RESPIRATORY_TRACT | Status: DC
  Filled 2020-04-08: qty 28

## 2020-04-08 MED ORDER — NITROGLYCERIN 0.4 MG SL SUBL: 0.4000 mg | SUBLINGUAL_TABLET | SUBLINGUAL | Status: DC | PRN

## 2020-04-08 MED ORDER — TRAZODONE HCL 50 MG PO TABS: 150.0000 mg | ORAL_TABLET | Freq: Every day | ORAL | Status: DC

## 2020-04-08 MED ORDER — IPRATROPIUM-ALBUTEROL 0.5-2.5 (3) MG/3ML IN SOLN
3.0000 mL | Freq: Four times a day (QID) | RESPIRATORY_TRACT | Status: DC | PRN
  Administered 2020-04-08: 3 mL via RESPIRATORY_TRACT

## 2020-04-08 MED ORDER — POTASSIUM CHLORIDE 10 MEQ/100ML IV SOLN
10.0000 meq | INTRAVENOUS | Status: AC
Start: 2020-04-08 — End: 2020-04-08
  Administered 2020-04-08 (×5): 10 meq via INTRAVENOUS
  Filled 2020-04-08 (×5): qty 100

## 2020-04-08 MED ORDER — DULOXETINE HCL 30 MG PO CPEP
60.0000 mg | ORAL_CAPSULE | Freq: Every day | ORAL | Status: DC
  Administered 2020-04-08 – 2020-04-10 (×3): 60 mg via ORAL
  Filled 2020-04-08: qty 2
  Filled 2020-04-08: qty 1
  Filled 2020-04-08: qty 2

## 2020-04-08 MED ORDER — SODIUM CHLORIDE 0.9 % IV SOLN: INTRAVENOUS | Status: DC

## 2020-04-08 MED ORDER — UBROGEPANT 100 MG PO TABS: 100.0000 mg | ORAL_TABLET | Freq: Every day | ORAL | Status: DC

## 2020-04-08 MED ORDER — FLUTICASONE PROPIONATE 50 MCG/ACT NA SUSP: 1.0000 | Freq: Two times a day (BID) | NASAL | Status: DC

## 2020-04-08 MED ORDER — ROFLUMILAST 500 MCG PO TABS
250.0000 ug | ORAL_TABLET | Freq: Every day | ORAL | Status: DC
  Administered 2020-04-08 – 2020-04-10 (×3): 250 ug via ORAL
  Filled 2020-04-08 (×3): qty 1

## 2020-04-08 MED ORDER — SODIUM CHLORIDE 0.9 % IV SOLN
100.0000 mg | Freq: Two times a day (BID) | INTRAVENOUS | Status: DC
  Administered 2020-04-08 – 2020-04-10 (×5): 100 mg via INTRAVENOUS
  Filled 2020-04-08 (×9): qty 100

## 2020-04-08 MED ORDER — FLUTICASONE-UMECLIDIN-VILANT 100-62.5-25 MCG/INH IN AEPB: 1.0000 | INHALATION_SPRAY | Freq: Every day | RESPIRATORY_TRACT | Status: DC

## 2020-04-08 MED ORDER — INSULIN ASPART 100 UNIT/ML ~~LOC~~ SOLN
0.0000 [IU] | Freq: Three times a day (TID) | SUBCUTANEOUS | Status: DC
  Administered 2020-04-08: 2 [IU] via SUBCUTANEOUS
  Administered 2020-04-09 – 2020-04-10 (×3): 3 [IU] via SUBCUTANEOUS
  Administered 2020-04-10: 1 [IU] via SUBCUTANEOUS
  Administered 2020-04-10: 2 [IU] via SUBCUTANEOUS
  Filled 2020-04-08 (×6): qty 1

## 2020-04-08 MED ORDER — TORSEMIDE 20 MG PO TABS: 40.0000 mg | ORAL_TABLET | Freq: Every day | ORAL | Status: DC

## 2020-04-08 MED ORDER — IPRATROPIUM-ALBUTEROL 0.5-2.5 (3) MG/3ML IN SOLN
RESPIRATORY_TRACT | Status: AC
  Filled 2020-04-08: qty 3

## 2020-04-08 MED ORDER — PANTOPRAZOLE SODIUM 40 MG IV SOLR
40.0000 mg | INTRAVENOUS | Status: DC
  Administered 2020-04-08 – 2020-04-10 (×3): 40 mg via INTRAVENOUS
  Filled 2020-04-08 (×3): qty 40

## 2020-04-08 MED ORDER — KETOROLAC TROMETHAMINE 15 MG/ML IJ SOLN
15.0000 mg | Freq: Four times a day (QID) | INTRAMUSCULAR | Status: AC | PRN
  Filled 2020-04-08: qty 1

## 2020-04-08 MED ORDER — NALOXONE HCL 2 MG/2ML IJ SOSY
0.4000 mg | PREFILLED_SYRINGE | Freq: Once | INTRAMUSCULAR | Status: AC
  Administered 2020-04-08: 0.4 mg via INTRAVENOUS

## 2020-04-08 MED ORDER — PREGABALIN 75 MG PO CAPS
200.0000 mg | ORAL_CAPSULE | Freq: Three times a day (TID) | ORAL | Status: DC
  Administered 2020-04-08: 200 mg via ORAL
  Filled 2020-04-08: qty 1

## 2020-04-08 MED ORDER — PREDNISONE 50 MG PO TABS
60.0000 mg | ORAL_TABLET | Freq: Every day | ORAL | Status: DC
  Administered 2020-04-08 – 2020-04-10 (×3): 60 mg via ORAL
  Filled 2020-04-08: qty 3
  Filled 2020-04-08 (×2): qty 1

## 2020-04-08 MED ORDER — AMIODARONE HCL 200 MG PO TABS: 200.0000 mg | ORAL_TABLET | Freq: Every day | ORAL | Status: DC

## 2020-04-08 MED ORDER — ATORVASTATIN CALCIUM 20 MG PO TABS
40.0000 mg | ORAL_TABLET | Freq: Every day | ORAL | Status: DC
  Administered 2020-04-08 – 2020-04-10 (×3): 40 mg via ORAL
  Filled 2020-04-08 (×3): qty 2

## 2020-04-08 MED ORDER — ALBUTEROL SULFATE (2.5 MG/3ML) 0.083% IN NEBU
2.5000 mg | INHALATION_SOLUTION | Freq: Four times a day (QID) | RESPIRATORY_TRACT | Status: DC
  Administered 2020-04-08 – 2020-04-10 (×9): 2.5 mg via RESPIRATORY_TRACT
  Filled 2020-04-08 (×9): qty 3

## 2020-04-08 MED ORDER — KETOROLAC TROMETHAMINE 30 MG/ML IJ SOLN
INTRAMUSCULAR | Status: AC
  Administered 2020-04-08: 15 mg via INTRAVENOUS
  Filled 2020-04-08: qty 1

## 2020-04-08 MED ORDER — LAMOTRIGINE 100 MG PO TABS
100.0000 mg | ORAL_TABLET | Freq: Every day | ORAL | Status: DC
  Administered 2020-04-08 – 2020-04-10 (×3): 100 mg via ORAL
  Filled 2020-04-08 (×3): qty 1

## 2020-04-08 MED ORDER — TAMSULOSIN HCL 0.4 MG PO CAPS
0.4000 mg | ORAL_CAPSULE | Freq: Every day | ORAL | Status: DC
  Administered 2020-04-08 – 2020-04-10 (×3): 0.4 mg via ORAL
  Filled 2020-04-08 (×3): qty 1

## 2020-04-08 MED ORDER — FLUTICASONE FUROATE-VILANTEROL 100-25 MCG/INH IN AEPB
1.0000 | INHALATION_SPRAY | Freq: Every day | RESPIRATORY_TRACT | Status: DC
  Administered 2020-04-08: 1 via RESPIRATORY_TRACT
  Filled 2020-04-08: qty 28

## 2020-04-08 MED ORDER — UMECLIDINIUM BROMIDE 62.5 MCG/INH IN AEPB
1.0000 | INHALATION_SPRAY | Freq: Every day | RESPIRATORY_TRACT | Status: DC
  Administered 2020-04-08 – 2020-04-10 (×2): 1 via RESPIRATORY_TRACT
  Filled 2020-04-08 (×2): qty 7

## 2020-04-08 MED ORDER — CEFTRIAXONE SODIUM 1 G IJ SOLR
1.0000 g | INTRAMUSCULAR | Status: DC
  Administered 2020-04-08 – 2020-04-10 (×3): 1 g via INTRAVENOUS
  Filled 2020-04-08: qty 10
  Filled 2020-04-08: qty 1
  Filled 2020-04-08 (×2): qty 10

## 2020-04-08 MED ORDER — MIDODRINE HCL 5 MG PO TABS
10.0000 mg | ORAL_TABLET | Freq: Two times a day (BID) | ORAL | Status: DC
  Administered 2020-04-08 – 2020-04-10 (×5): 10 mg via ORAL
  Filled 2020-04-08 (×7): qty 2

## 2020-04-08 MED ORDER — MAGNESIUM OXIDE 400 (241.3 MG) MG PO TABS
400.0000 mg | ORAL_TABLET | Freq: Every day | ORAL | Status: DC
  Administered 2020-04-08 – 2020-04-10 (×3): 400 mg via ORAL
  Filled 2020-04-08 (×3): qty 1

## 2020-04-08 MED ORDER — NALOXONE HCL 4 MG/10ML IJ SOLN
0.2500 mg/h | INTRAVENOUS | Status: DC
  Administered 2020-04-08: 0.25 mg/h via INTRAVENOUS
  Filled 2020-04-08: qty 10

## 2020-04-08 NOTE — ED Notes (Signed)
Sung MD asked about Bipap since pt out of it again. Orders to give more narcan, possibly narcan gtt to keep pt up for him to have bipap

## 2020-04-08 NOTE — ED Notes (Signed)
Md at bedside and made aware of pt mentation. MD said to stop narcan and observe pt in 2 hours. Charge nurse made aware. Will hold pt in ED for the next 2 hours for observation.

## 2020-04-08 NOTE — ED Notes (Signed)
Pt resting quietly. Call light within reach. Fall precautions in place.  

## 2020-04-08 NOTE — Progress Notes (Signed)
Called by RN because nebulizer aerogen was not working, changed out set up, now aerogen is working

## 2020-04-08 NOTE — H&P (Addendum)
History and Physical    Gregory Hall ZOX:[id number] DOB: 15-Jun-1955 DOA: 04/07/2020  PCP: Koren Bound, NP    Patient coming from:  SNF   Chief Complaint:  AMS   HPI: Gregory Hall is a 65 y.o. male seen in ed with complaints of AMS. Pt coming from nursing home, HPI is limited as pt is Altered with GCS of 3. Pt responded to trial dose of narcan and per edmd and will put pt on Narcan drip. AbG does show hypercarbia and AKI. Per edmd reports EMS was called to SNF and pt initially refused transport to hospital but 5 min later he was altered and unresponsive.  Pt has past medical history of stage IV COPD, current smoker, chronic respiratory failure with hypoxia on 4 L at home.  Patient sees pulmonary Dr. Jayme Cloud.  Patient also has past medical history of acute kidney injury in the past, heart disease, heart failure, this GERD, dyslipidemia and hypertension, nutcracker esophagus, MRSA.  ED Course:  Vitals:   04/07/20 2259 04/07/20 2310 04/08/20 0031 04/08/20 0103  BP: 114/67 122/70 (!) 93/58   Pulse: 62  64 71  Resp: 20  10   Temp:      TempSrc:      SpO2: 96%  97% 94%  The emergency room patient is somnolent with a GCS of 3, responding to Narcan trial, to be started on Narcan drip.  Afebrile temperature 97.8, blood pressure 114/67 oxygenating 96%, ABG shows elevated PCO2 of 67, PO2 of 67, pH of 7.42.  Comprehensive metabolic panel shows hypokalemia with a potassium of 3.1, glucose of 151, AKI.  The creatinine of 1.97, troponin of 7, lactic acid 1.0, procalcitonin of 0.33 CBC shows normal white count of 8.8, hemoglobin of 11.1, platelets of 239.  Serum alcohol level of less than 10, glucose of 151, INR of 1.3.  Review of Systems:  Review of Systems  Unable to perform ROS: Mental status change (GCS of 3 HPI review of systems unobtainable.)     Past Medical History:  Diagnosis Date  . AKI (acute kidney injury) (HCC) 09/12/2019  . CAD (coronary artery disease)     s/p PTCA and stent x2  . Chest pain   . CHF (congestive heart failure) (HCC)   . Chronic pain syndrome   . COPD (chronic obstructive pulmonary disease) (HCC)   . Degenerative cervical disc   . Depression   . Diabetes mellitus without complication (HCC)   . Dyslipidemia   . GERD (gastroesophageal reflux disease)   . Hernia 2014  . Hypertension   . MRSA (methicillin resistant staph aureus) culture positive 2011  . Neuropathy   . Nutcracker esophagus   . Rectus diastasis 07/19/2012    Past Surgical History:  Procedure Laterality Date  . BACK SURGERY  2012  . CARDIOVERSION N/A 09/26/2019   Procedure: CARDIOVERSION;  Surgeon: Antonieta Iba, MD;  Location: [facility] ORS;  Service: Cardiovascular;  Laterality: N/A;  . CHOLECYSTECTOMY    . COLONOSCOPY  Jan 2014   [name]  . CORONARY ANGIOPLASTY WITH STENT PLACEMENT  2009   stents x2, in [place], [place]  . FOOT SURGERY     Right  . NECK SURGERY    . SPINE SURGERY  2012,2013  . TONSILLECTOMY       reports that he has been smoking cigarettes. He has a 15.00 pack-year smoking history. He has never used smokeless tobacco. He reports that he does not drink alcohol and does not use drugs.  Allergies  Allergen Reactions  . Acetaminophen Other (See Comments)    Reaction:  Unknown  Kidney failure Told not to take from home M.D. Related to kidney and renal failure   . Fentanyl   . Nsaids Other (See Comments)    Reaction:  Unknown  Kidney failure Patient states not to take from home M.D. Related to kidney and renal failure.  Other reaction(s): Unknown    Family History  Family history unknown: Yes    Prior to Admission medications   Medication Sig Start Date End Date Taking? Authorizing Provider  amiodarone (PACERONE) 200 MG tablet Take 1 tablet (200 mg total) by mouth daily. 11/03/19   Marisue Ivan D, PA-C  atorvastatin (LIPITOR) 40 MG tablet Take 1 tablet (40 mg total) by mouth daily. 09/28/19   Lewie Chamber, MD  baclofen  (LIORESAL) 20 MG tablet Take 20 mg by mouth 3 (three) times daily.    [provider]  budesonide (PULMICORT) 0.5 MG/2ML nebulizer solution Take 0.5 mg by nebulization 2 (two) times daily.    [provider]  Cholecalciferol (VITAMIN D3) 50 MCG (2000 UT) TABS Take 2,000 Units by mouth daily.     [provider]  DULoxetine (CYMBALTA) 60 MG capsule Take 60 mg by mouth daily.    [provider]  Ensure Max Protein (ENSURE MAX PROTEIN) LIQD Take 330 mLs (11 oz total) by mouth 2 (two) times daily between meals. 06/14/19   Sheikh, Omair Latif, DO  fexofenadine (ALLEGRA) 180 MG tablet Take 180 mg by mouth daily. 01/23/20   [provider]  fluticasone (FLONASE) 50 MCG/ACT nasal spray Place 1 spray into both nostrils 2 (two) times daily. 12/15/19   Pennie Banter, DO  Fluticasone-Umeclidin-Vilant (TRELEGY ELLIPTA) 100-62.5-25 MCG/INH AEPB Inhale 1 puff into the lungs daily.  10/20/19   [provider]  glyBURIDE (DIABETA) 2.5 MG tablet Take 2.5 mg by mouth daily. 01/01/20   [provider]  glycopyrrolate (ROBINUL) 1 MG tablet Take 1 tablet (1 mg total) by mouth 3 (three) times daily. 02/21/20   Arnetha Courser, MD  guaiFENesin (MUCINEX) 600 MG 12 hr tablet Take 1 tablet (600 mg total) by mouth 2 (two) times daily. 02/21/20   Arnetha Courser, MD  insulin glargine (LANTUS) 100 UNIT/ML injection Inject 11 Units into the skin at bedtime.     [provider]  insulin lispro (HUMALOG) 100 UNIT/ML injection Inject 4 Units into the skin See admin instructions. Inject 4 units with blood sugar 150-250; inject 6 units 251-350; greater than 351 call provider; Hold for FSBG <150    [provider]  ipratropium-albuterol (DUONEB) 0.5-2.5 (3) MG/3ML SOLN Take 3 mLs by nebulization every 6 (six) hours as needed. Patient taking differently: Take 3 mLs by nebulization every 6 (six) hours as needed (wheezing). 06/14/19   Marguerita Merles Latif, DO   lamoTRIgine (LAMICTAL) 100 MG tablet Take 100 mg by mouth daily.    [provider]  lidocaine (LIDODERM) 5 % Place 1 patch onto the skin daily. Remove & Discard patch within 12 hours or as directed by MD 03/24/20   Calvert Cantor, MD  magnesium oxide (MAG-OX) 400 (241.3 Mg) MG tablet Take 1 tablet by mouth daily. 12/24/19   [provider]  midodrine (PROAMATINE) 10 MG tablet Take 1 tablet (10 mg total) by mouth 2 (two) times daily. Take if top blood pressure reading is less than 100 02/16/20   Dunn, Ryan M, PA-C  NAC 600 MG CAPS Take  600 mg by mouth daily. 09/08/19   [provider]  NITROSTAT 0.4 MG SL tablet DISSOLVE (1) TABLET UNDER TONGUE AS NEEDED TO RELIEVE CHEST PAIN. MAYREPEAT EVERY 5 MINUTES. Patient taking differently: Place 0.4 mg under the tongue every 5 (five) minutes as needed for chest pain. 10/06/15   Iran Ouch, MD  ondansetron (ZOFRAN-ODT) 4 MG disintegrating tablet Take 1 tablet (4 mg total) by mouth every 8 (eight) hours as needed for nausea or vomiting. 02/21/20   Arnetha Courser, MD  Oxycodone HCl 10 MG TABS Take 1 tablet (10 mg total) by mouth 3 (three) times daily. Patient taking differently: Take 10 mg by mouth every 6 (six) hours. 09/14/19   Danford, Earl Lites, MD  oxymetazoline (AFRIN) 0.05 % nasal spray Place 2 sprays into both nostrils 2 (two) times daily as needed for congestion.    [provider]  pantoprazole (PROTONIX) 40 MG tablet Take 40 mg by mouth daily.     [provider]  predniSONE (DELTASONE) 10 MG tablet Take 6 tablets (60 mg total) by mouth daily with breakfast. Taper by 10 mg daily until tablets are finished 03/24/20   Calvert Cantor, MD  pregabalin (LYRICA) 200 MG capsule Take 1 capsule (200 mg total) by mouth 3 (three) times daily. 09/14/19   Danford, Earl Lites, MD  rivaroxaban (XARELTO) 20 MG TABS tablet Take 20 mg by mouth daily with supper.    [provider]  roflumilast (DALIRESP) 500 MCG  TABS tablet Take 0.5 tablets (250 mcg total) by mouth daily. 02/21/20   Arnetha Courser, MD  senna-docusate (SENOKOT-S) 8.6-50 MG tablet Take 1 tablet by mouth daily.     [provider]  sodium chloride (OCEAN) 0.65 % SOLN nasal spray Place 1 spray into both nostrils as needed for congestion. 12/15/19   Pennie Banter, DO  tamsulosin (FLOMAX) 0.4 MG CAPS capsule Take 1 capsule (0.4 mg total) by mouth daily after supper. 10/10/19   Lynn Ito, MD  torsemide (DEMADEX) 20 MG tablet Take 2 tablets (40 mg total) by mouth daily. 01/21/20 04/20/20  Delma Freeze, FNP  traZODone (DESYREL) 150 MG tablet Take 150 mg by mouth at bedtime. 01/23/20   [provider]  Ubrogepant (UBRELVY) 100 MG TABS Take 100 mg by mouth daily. 02/22/20   Arnetha Courser, MD    Physical Exam: Vitals:   04/07/20 2259 04/07/20 2310 04/08/20 0031 04/08/20 0103  BP: 114/67 122/70 (!) 93/58   Pulse: 62  64 71  Resp: 20  10   Temp:      TempSrc:      SpO2: 96%  97% 94%   Physical Exam Vitals and nursing note reviewed.  Constitutional:      Appearance: He is not ill-appearing.     Interventions: Face mask in place.     Comments: Bipap  HENT:     Head: Normocephalic and atraumatic.     Right Ear: External ear normal.     Left Ear: External ear normal.  Cardiovascular:     Rate and Rhythm: Normal rate and regular rhythm.  Pulmonary:     Effort: Pulmonary effort is normal.     Breath sounds: Examination of the right-upper field reveals wheezing and rhonchi. Examination of the left-upper field reveals wheezing and rhonchi. Examination of the right-middle field reveals wheezing and rhonchi. Examination of the left-middle field reveals wheezing and rhonchi. Examination of the right-lower field reveals rhonchi. Examination of the left-lower field reveals wheezing and rhonchi.  Wheezing and rhonchi present.  Abdominal:     General: Bowel sounds are normal. There is distension.     Palpations: Abdomen is soft.  There is no mass.     Tenderness: There is no abdominal tenderness. There is no guarding.  Musculoskeletal:     Right lower leg: No edema.     Left lower leg: No edema.  Neurological:     General: No focal deficit present.     Mental Status: He is lethargic.     GCS: GCS eye subscore is 1. GCS verbal subscore is 1. GCS motor subscore is 1.     Cranial Nerves: No cranial nerve deficit.     Labs on Admission: I have personally reviewed following labs and imaging studies  No results for input(s): CKTOTAL, CKMB, TROPONINI in the last 72 hours. Lab Results  Component Value Date   WBC 8.8 04/07/2020   HGB 11.1 (L) 04/07/2020   HCT 37.2 (L) 04/07/2020   MCV 83.0 04/07/2020   PLT 239 04/07/2020    Recent Labs  Lab 04/07/20 2330  NA 137  K 3.1*  CL 86*  CO2 38*  BUN 33*  CREATININE 1.97*  CALCIUM 8.6*  PROT 6.4*  BILITOT 0.5  ALKPHOS 112  ALT 14  AST 15  GLUCOSE 151*   Lab Results  Component Value Date   CHOL 97 09/25/2019   HDL 28 (L) 09/25/2019   LDLCALC 46 09/25/2019   TRIG 67 10/25/2019   No results found for: DDIMER Invalid input(s): POCBNP  Urinalysis    Component Value Date/Time   COLORURINE YELLOW (A) 03/20/2020 1231   APPEARANCEUR CLEAR (A) 03/20/2020 1231   APPEARANCEUR Clear 10/28/2013 2215   LABSPEC 1.017 03/20/2020 1231   LABSPEC 1.002 10/28/2013 2215   PHURINE 5.0 03/20/2020 1231   GLUCOSEU NEGATIVE 03/20/2020 1231   GLUCOSEU Negative 10/28/2013 2215   HGBUR NEGATIVE 03/20/2020 1231   BILIRUBINUR NEGATIVE 03/20/2020 1231   BILIRUBINUR Negative 10/28/2013 2215   KETONESUR NEGATIVE 03/20/2020 1231   PROTEINUR NEGATIVE 03/20/2020 1231   UROBILINOGEN 1.0 03/29/2009 0840   NITRITE NEGATIVE 03/20/2020 1231   LEUKOCYTESUR NEGATIVE 03/20/2020 1231   LEUKOCYTESUR Negative 10/28/2013 2215   COVID-19 Labs  No results for input(s): DDIMER, FERRITIN, LDH, CRP in the last 72 hours.  Lab Results  Component Value Date   SARSCOV2NAA NEGATIVE  04/07/2020   SARSCOV2NAA NEGATIVE 03/20/2020   SARSCOV2NAA NEGATIVE 02/17/2020   SARSCOV2NAA NEGATIVE 12/13/2019    Radiological Exams on Admission: CT Head Wo Contrast  Result Date: 04/07/2020 CLINICAL DATA:  Altered mental status, unresponsive EXAM: CT HEAD WITHOUT CONTRAST TECHNIQUE: Contiguous axial images were obtained from the base of the skull through the vertex without intravenous contrast. COMPARISON:  MRI 03/15/2020, CT brain 10/22/2019 FINDINGS: Brain: No acute territorial infarction, hemorrhage or new intracranial mass is visualized. Mild atrophy. 11 mm cyst or cystic lesion within the anterior temporal lobe on the right without change. Stable ventricle size Vascular: No hyperdense vessels.  Carotid vascular calcification Skull: Normal. Negative for fracture or focal lesion. Sinuses/Orbits: No acute finding. Other: None IMPRESSION: 1. No CT evidence for acute intracranial abnormality. 2. Atrophy. 3. Stable 11 mm cyst or cystic lesion within the right anterior temporal lobe, see MRI report 03/15/2020. Electronically Signed   By: Jasmine Pang M.D.   On: 04/07/2020 23:15   DG Chest Port 1 View  Result Date: 04/07/2020 CLINICAL DATA:  Altered mental status EXAM: PORTABLE CHEST 1 VIEW COMPARISON:  03/21/2020 FINDINGS: Cardiac  shadow is within normal limits. Spinal stimulator is again noted and stable. Aortic calcifications are seen. The lungs are well aerated bilaterally with mild patchy airspace opacity in the bases. Postsurgical changes in the cervical spine are noted. Left subclavian arterial stent is seen as well. IMPRESSION: Mild bibasilar airspace opacity. Electronically Signed   By: Alcide Clever M.D.   On: 04/07/2020 23:21    EKG: Independently reviewed.  Sinus rhythm 70 , q waves in lead V1,  with prolonged QTC 594 prior qtc 374.    Assessment/Plan Principal Problem:   Altered mental status Active Problems:   Acute on chronic respiratory failure with hypoxia and hypercapnia  (HCC)   SMOKER   Coronary atherosclerosis   HTN (hypertension)   Chronic prescription opiate use   Diabetes mellitus type 2, uncontrolled, with complications (HCC)   AKI (acute kidney injury) (HCC)   Diastolic dysfunction with chronic heart failure (HCC)   AMS (altered mental status)   Prolonged QT interval   Altered mental status:  We will admit to stepdown unit.  Attribute to opiate intoxication, continue patient on Narcan drip, head CT is negative. Monitor neurochecks and vitals with continuous cardiac and pulse oximetry monitoring. Keep patient n.p.o. and additional supportive measures with fall and aspiration precautions. Also empiric cover for CAP, pt sounds rhonchus and is wheezing diffusely posteriorly.   Acute on chronic respiratory failure with hypercapnia hypoxia: We will start patient on BiPAP therapy with respiratory therapy consult. N.p.o. to prevent aspiration.    Smoker/nicotine dependence: Counseling when patient is alert and stable.    Coronary artery disease: We will continue patient on atorvastatin, amiodarone, as needed nitroglycerin, Xarelto.  Diabetes mellitus type 2: Patient continued on sliding scale insulin only.  Acute kidney injury:  Lab Results  Component Value Date   CREATININE 1.97 (H) 04/07/2020   CREATININE 1.48 (H) 03/24/2020   CREATININE 1.08 03/23/2020  Will hold diuretic therapy and continue IV fluids overnight. Will monitor kidney function avoid contrast studies renally dose all meds.   Prolonged QTc: Cardiology consult admit to stepdown. Will hold trazodone. Will hold amiodarone. Will get mag level.    DVT prophylaxis:  Heparin  Code Status:  Full Code   Family Communication:  Charletta Cousin (Sister)  [phone] [facility] Phone)  Disposition Plan:  SNF   Consults called:  None  Admission status: Inpatient.      Gertha Calkin MD [facility] [phone] How to contact the [facility] Attending or Consulting provider  7A - 7P or covering provider during after hours 7P -7A, for this patient.    1. Check the care team in [facility] and look for a) attending/consulting [facility] provider listed and b) the [facility] team listed 2. Log into [email or web address] and use [place]'s universal password to access. If you do not have the password, please contact the hospital operator. 3. Locate the [facility] provider you are looking for under [facility] and page to a number that you can be directly reached. 4. If you still have difficulty reaching the provider, please page the [facility] (Director on Call) for the Hospitalists listed on amion for assistance. [email or web address] Password [email or web address] 04/08/2020, 1:20 AM

## 2020-04-08 NOTE — Progress Notes (Signed)
PT Cancellation Note  Patient Details Name: Gregory Hall MRN: [id number] DOB: Sep 05, 1955   Cancelled Treatment:    Reason Eval/Treat Not Completed: Medical issues which prohibited therapy (Per chart review, K+: 2.9 this date, out of range for PT services per facility guidelines. Will defer evaluation to later date/time once appropriate.)   Angeliki Mates C 04/08/2020, 4:57 PM

## 2020-04-08 NOTE — ED Notes (Signed)
Patient woke up and smiled at RN. Pt states, " my right leg hurts". BiPAP mask removed and pt states he does not recall while he is here. Pt quickly returned to altered state and sats at 89% on RA.

## 2020-04-08 NOTE — ED Notes (Signed)
Hospitalist at bedside. Patient alert and oriented x3. Patient switched from BiPaP to 4L via [place]. Plan to observe for 30 minutes to see how patient tolerates. Patient c/o R hip pain.

## 2020-04-08 NOTE — ED Notes (Addendum)
Pt was taken off BIPAP to administer breathing treatments. Once treatments were finished, Pt refused to be put back on the BIPAP. Pt states he wants Oxycodone 10mg  and an ABG redrawn before going back on BIPAP. MD aware.

## 2020-04-08 NOTE — ED Notes (Signed)
Pt retaining urine. Unable to void Bladder scan shows greater than . New order received to insert urinary catheter. Pt refuses catheter stating "Get me some Pain medication first." Pt educated on why we can't give him narcotic pain meds at the moment. Pt states "then I want to be discharged.". MD notified.

## 2020-04-08 NOTE — ED Notes (Signed)
Rt at bedside for ABG. Neb tx complete.

## 2020-04-08 NOTE — Progress Notes (Signed)
Patient seen and examined this morning, admitted overnight by Dr. Allena Katz.  This is a 65 year old male coming from a nursing home with history of COPD on chronic 4 L of oxygen at home, chronic kidney disease stage IIIa with baseline 1.0-1.4, CAD, HTN, HLD, chronic pain on opioids, current smoker, type 2 diabetes mellitus, paroxysmal A. fib, chronic pain, who comes into the hospital with confusion.  He was very somnolent on arrival in the ED, but appeared to have responded to Narcan.  He was started on Narcan drip.  An ABG showed chronic hypercarbic respiratory failure and he was placed on BiPAP.  On my evaluation this morning he is more alert, he was taken off BiPAP and he is able to carry a conversation.  He does remember what happened but knows that he is in the hospital and knows the year.   Principal problem Acute metabolic encephalopathy -unclear etiology with high suspicion for taking too many oxycodones given the fact that he responded to Narcan drip.  Patient denies that.  His urine drug screen is positive for opioids.  He tells me he has been having right hip pain which is worse now but denies taking too much oxycodone other than 10 mg every 4 hours he tells me that is prescribed for him. -There is definitely a component for polypharmacy given comprehensive and long list of home medications  Active problems Chronic hypoxic and hypercarbic respiratory failure, COPD-patient placed on BiPAP, ABG did show chronic hypercarbia but given normal pH does not be at all acute.  Tolerating off BiPAP this morning -He is on chronic prednisone, started on higher dose of 60 mg now in the setting of acute illness  Chronic pain -Avoid opioids, he does have acute kidney injury but will do a few doses of Toradol  Acute kidney injury on chronic kidney disease stage IIIa -Likely in the setting of acute illness, continue fluids, creatinine of presentation 1.97 improved to 1.78  Hypokalemia -Continue to monitor  and replete  Tobacco use -Needs counseling when more awake  Normocytic anemia -Of chronic disease  Type 2 diabetes mellitus -Continue insulin  Chronic hypotension -Continue home midodrine  Hyperlipidemia -Continue atorvastatin  Paroxysmal A. fib -Continue amiodarone, anticoagulation with Xarelto  BPH -continue Flomax  Scheduled Meds: . atorvastatin  40 mg Oral Daily  . budesonide  0.5 mg Nebulization BID  . DULoxetine  60 mg Oral Daily  . fluticasone furoate-vilanterol  1 puff Inhalation Daily   And  . umeclidinium bromide  1 puff Inhalation Daily  . glycopyrrolate  1 mg Oral TID  . insulin aspart  0-6 Units Subcutaneous TID WC  . lamoTRIgine  100 mg Oral Daily  . magnesium oxide  400 mg Oral Daily  . midodrine  10 mg Oral BID  . nicotine  14 mg Transdermal Daily  . pantoprazole (PROTONIX) IV  40 mg Intravenous Q24H  . predniSONE  60 mg Oral Q breakfast  . pregabalin  150 mg Oral BID  . rivaroxaban  20 mg Oral Q supper  . roflumilast  250 mcg Oral Daily  . tamsulosin  0.4 mg Oral QPC supper   Continuous Infusions: . sodium chloride 75 mL/hr at 04/08/20 0808  . cefTRIAXone (ROCEPHIN)  IV Stopped (04/08/20 0235)  . doxycycline (VIBRAMYCIN) IV Stopped (04/08/20 0440)  . naLOXone [facility]) adult infusion for OVERDOSE 0.25 mg/hr (04/08/20 0807)  . potassium chloride 10 mEq (04/08/20 1020)   PRN Meds:.ipratropium-albuterol, nitroGLYCERIN  Gregory Shannahan M. Elvera Lennox, MD, PhD [facility]  Between 7 am - 7 pm you can contact me via Amion or Securechat.  I am not available 7 pm - 7 am, please contact night coverage MD/APP via Amion

## 2020-04-08 NOTE — ED Notes (Signed)
This RN spoke with pts nurse from the facility and gave an update.

## 2020-04-08 NOTE — ED Notes (Signed)
OK per H. Para March, MD to administer Toradol. Pt states he has taken it before and had no reaction.

## 2020-04-08 NOTE — ED Notes (Signed)
Neb attachment not working. Rt called.

## 2020-04-08 NOTE — ED Notes (Signed)
Pt moved to room 10 to be closer to the nurses station for safety. New RN provided with report.

## 2020-04-09 ENCOUNTER — Other Ambulatory Visit: Payer: Self-pay

## 2020-04-09 DIAGNOSIS — J9621 Acute and chronic respiratory failure with hypoxia: Secondary | ICD-10-CM

## 2020-04-09 DIAGNOSIS — J9622 Acute and chronic respiratory failure with hypercapnia: Secondary | ICD-10-CM

## 2020-04-09 DIAGNOSIS — G9341 Metabolic encephalopathy: Secondary | ICD-10-CM

## 2020-04-09 DIAGNOSIS — N179 Acute kidney failure, unspecified: Secondary | ICD-10-CM

## 2020-04-09 LAB — COMPREHENSIVE METABOLIC PANEL
ALT: 13 U/L (ref 0–44)
AST: 15 U/L (ref 15–41)
Albumin: 2.7 g/dL — ABNORMAL LOW (ref 3.5–5.0)
Alkaline Phosphatase: 104 U/L (ref 38–126)
Anion gap: 10 (ref 5–15)
BUN: 20 mg/dL (ref 8–23)
CO2: 32 mmol/L (ref 22–32)
Calcium: 7.9 mg/dL — ABNORMAL LOW (ref 8.9–10.3)
Chloride: 94 mmol/L — ABNORMAL LOW (ref 98–111)
Creatinine, Ser: 1.28 mg/dL — ABNORMAL HIGH (ref 0.61–1.24)
GFR, Estimated: >60 mL/min (ref >60)
Glucose, Bld: 120 mg/dL — ABNORMAL HIGH (ref 70–99)
Potassium: 3.5 mmol/L (ref 3.5–5.1)
Sodium: 136 mmol/L (ref 135–145)
Total Bilirubin: 0.7 mg/dL (ref 0.3–1.2)
Total Protein: 5.2 g/dL — ABNORMAL LOW (ref 6.5–8.1)

## 2020-04-09 LAB — CBC
HCT: 32.8 % — ABNORMAL LOW (ref 39.0–52.0)
Hemoglobin: 9.8 g/dL — ABNORMAL LOW (ref 13.0–17.0)
MCH: 24.8 pg — ABNORMAL LOW (ref 26.0–34.0)
MCHC: 29.9 g/dL — ABNORMAL LOW (ref 30.0–36.0)
MCV: 83 fL (ref 80.0–100.0)
Platelets: 220 10*3/uL (ref 150–400)
RBC: 3.95 MIL/uL — ABNORMAL LOW (ref 4.22–5.81)
RDW: 25.7 % — ABNORMAL HIGH (ref 11.5–15.5)
WBC: 7.4 10*3/uL (ref 4.0–10.5)
nRBC: 0 % (ref 0.0–0.2)

## 2020-04-09 LAB — GLUCOSE, CAPILLARY
Glucose-Capillary: 138 mg/dL — ABNORMAL HIGH (ref 70–99)
Glucose-Capillary: 293 mg/dL — ABNORMAL HIGH (ref 70–99)
Glucose-Capillary: 265 mg/dL — ABNORMAL HIGH (ref 70–99)
Glucose-Capillary: 324 mg/dL — ABNORMAL HIGH (ref 70–99)

## 2020-04-09 LAB — RESP PANEL BY RT-PCR (FLU A&B, COVID) ARPGX2
Influenza A by PCR: NEGATIVE
Influenza B by PCR: NEGATIVE
SARS Coronavirus 2 by RT PCR: NEGATIVE

## 2020-04-09 MED ORDER — POTASSIUM CHLORIDE CRYS ER 20 MEQ PO TBCR
30.0000 meq | EXTENDED_RELEASE_TABLET | Freq: Once | ORAL | Status: AC
  Administered 2020-04-09: 30 meq via ORAL
  Filled 2020-04-09: qty 1

## 2020-04-09 MED ORDER — POTASSIUM CHLORIDE ER 10 MEQ PO TBCR: 10.0000 meq | EXTENDED_RELEASE_TABLET | Freq: Every day | ORAL | 0 refills | Status: DC

## 2020-04-09 MED ORDER — ORAL CARE MOUTH RINSE
15.0000 mL | Freq: Two times a day (BID) | OROMUCOSAL | Status: DC
  Administered 2020-04-09 – 2020-04-10 (×3): 15 mL via OROMUCOSAL

## 2020-04-09 MED ORDER — TORSEMIDE 20 MG PO TABS: 20.0000 mg | ORAL_TABLET | Freq: Every day | ORAL | 3 refills | Status: DC

## 2020-04-09 MED ORDER — OXYCODONE HCL 5 MG PO TABS
5.0000 mg | ORAL_TABLET | ORAL | Status: AC | PRN
  Administered 2020-04-09 (×2): 5 mg via ORAL
  Filled 2020-04-09 (×2): qty 1

## 2020-04-09 MED ORDER — DOXYCYCLINE HYCLATE 100 MG PO CAPS: 100.0000 mg | ORAL_CAPSULE | Freq: Two times a day (BID) | ORAL | 0 refills | Status: AC

## 2020-04-09 MED ORDER — OXYCODONE HCL 10 MG PO TABS
10.0000 mg | ORAL_TABLET | Freq: Three times a day (TID) | ORAL | 0 refills | Status: DC
Start: 2020-04-09 — End: 2020-05-25

## 2020-04-09 MED ORDER — OXYCODONE HCL 5 MG PO TABS
10.0000 mg | ORAL_TABLET | ORAL | Status: DC | PRN
Start: 2020-04-09 — End: 2020-04-11
  Administered 2020-04-09 – 2020-04-10 (×8): 10 mg via ORAL
  Filled 2020-04-09 (×8): qty 2

## 2020-04-09 MED ORDER — CHLORHEXIDINE GLUCONATE CLOTH 2 % EX PADS: 6.0000 | MEDICATED_PAD | Freq: Every day | CUTANEOUS | Status: DC

## 2020-04-09 NOTE — Progress Notes (Signed)
Removed Foley - will monitor for urine output prior to leaving.

## 2020-04-09 NOTE — Progress Notes (Addendum)
1400:   Patient still has not urinate in condom cath.  Bladder scan check-  175 ml noted  1600:  6 hours post foley removal- still no urine output in condom cath.    MD aware Patient is on urinary medication ok still for discharge  Patient requested urology consult- MD set up for outpatient consult

## 2020-04-09 NOTE — Progress Notes (Addendum)
Pt Bladder scan showed 645. Pt in and out with a volume of 550. Bladder scan post in and out cath at 229. Will continue to monitor.  Update 0115: Pt is complaining of dry eyes. MD Para March made aware. Will continue to monitor.  Update 0135: Pt Bladder scan 0. Pt have an output of 600 ml ay 2247. Will continue to monitor.  Update 0147: MD Para March placed order. Will continue to monitor.

## 2020-04-09 NOTE — Evaluation (Signed)
Physical Therapy Evaluation Patient Details Name: Gregory Hall MRN: [id number] DOB: [birth year] Today's Date: 04/09/2020   History of Present Illness  Pt is a 65 y.o. male presenting to hospital 3/30 d/t being unresponsive.  Pt admitted to hospital with AMS and acute on chronic respiratory failure with hypercapnia hypoxia.  PMH includes COPD, CHF, chronic pain, DM, htn, CAD, depression, tobacco abuse, AKI, chest pain, neuropathy, h/o back surgery, cardioversion, R foot surgery, neck surgery.  Chronic  LBP and neck pain.  3-4 L home O2 baseline.  Clinical Impression  Prior to hospital admission, pt was modified independent w/c level functional mobility; working on walking with HHPT (limited d/t recent R hip pain); and lives at a family care home.  Chronic 3-4 L home O2 use.  Currently pt is modified independent with bed mobility; CGA to SBA with w/c level transfers; and SBA propeling manual w/c with B LE's x240 feet.  Pt would benefit from skilled PT to address noted impairments and functional limitations (see below for any additional details).  Upon hospital discharge, pt would benefit from continued HHPT to improve overall strength, balance, and ambulation.    Follow Up Recommendations Home health PT    Equipment Recommendations  Other (comment) (pt has needed DME at home already)    Recommendations for Other Services       Precautions / Restrictions Precautions Precautions: Fall Restrictions Weight Bearing Restrictions: No      Mobility  Bed Mobility Overal bed mobility: Modified Independent Bed Mobility: Supine to Sit;Sit to Supine     Supine to sit: Modified independent (Device/Increase time) Sit to supine: Modified independent (Device/Increase time)   General bed mobility comments: no difficulties noted; HOB mildly elevated    Transfers Overall transfer level: Needs assistance Equipment used: None Transfers: Stand Pivot Transfers   Stand pivot transfers: Min  guard;Supervision       General transfer comment: stand step turn bed to/from w/c; steady and safe  Ambulation/Gait             General Gait Details: Deferred this session (pt reports working with HHPT on walking with RW)  Administrator mobility: Yes Wheelchair propulsion: Both lower extermities Wheelchair parts: Psychologist, sport and exercise Details (indicate cue type and reason): pt able to navigate around obstacles safely and manage brakes safely  Modified Rankin (Stroke Patients Only)       Balance Overall balance assessment: Needs assistance Sitting-balance support: No upper extremity supported;Feet supported Sitting balance-Leahy Scale: Normal Sitting balance - Comments: steady sitting reaching outside BOS   Standing balance support: Single extremity supported Standing balance-Leahy Scale: Fair Standing balance comment: steady with at least single UE support                             Pertinent Vitals/Pain Pain Assessment: Faces (pt declined to rate pain when asked during session (only reports recent pain meds)) Faces Pain Scale: Hurts little more Pain Location: back and neck Pain Descriptors / Indicators: Sore;Aching Pain Intervention(s): Limited activity within patient's tolerance;Monitored during session;Premedicated before session;Repositioned  Vitals (HR and O2 on supplemental O2 via nasal cannula) stable and WFL throughout treatment session.    Home Living Family/patient expects to be discharged to:: Assisted living Living Arrangements: Other (Comment) (Family Care Home)             Home Equipment: Hand held shower  head;Grab bars - tub/shower;Grab bars - toilet;Wheelchair - manual Additional Comments: ramp to enter; 1 level    Prior Function Level of Independence: Needs assistance   Gait / Transfers Assistance Needed: Modified independent w/c level functional  mobility.  Receiving HHPT 2x/week working on walking with RW and strengthening.  ADL's / Homemaking Assistance Needed: Staff set-up for sponge baths; assist for meals and medications.        Hand Dominance   Dominant Hand: Right    Extremity/Trunk Assessment   Upper Extremity Assessment Upper Extremity Assessment: Generalized weakness    Lower Extremity Assessment Lower Extremity Assessment: Generalized weakness    Cervical / Trunk Assessment Cervical / Trunk Assessment: Normal  Communication   Communication: No difficulties  Cognition Arousal/Alertness: Awake/alert Behavior During Therapy: WFL for tasks assessed/performed Overall Cognitive Status: Within Functional Limits for tasks assessed                                        General Comments   Nursing cleared pt for participation in physical therapy.  Pt agreeable to PT session.    Exercises     Assessment/Plan    PT Assessment Patient needs continued PT services  PT Problem List Decreased strength;Decreased activity tolerance;Decreased mobility;Pain       PT Treatment Interventions DME instruction;Gait training;Functional mobility training;Therapeutic activities;Therapeutic exercise;Balance training;Patient/family education;Wheelchair mobility training    PT Goals (Current goals can be found in the Care Plan section)  Acute Rehab PT Goals Patient Stated Goal: to go back to family care home PT Goal Formulation: With patient Time For Goal Achievement: 04/06/20 Potential to Achieve Goals: Good    Frequency Min 2X/week   Barriers to discharge        Co-evaluation               AM-PAC PT "6 Clicks" Mobility  Outcome Measure Help needed turning from your back to your side while in a flat bed without using bedrails?: None Help needed moving from lying on your back to sitting on the side of a flat bed without using bedrails?: A Little Help needed moving to and from a bed to a chair  (including a wheelchair)?: A Little Help needed standing up from a chair using your arms (e.g., wheelchair or bedside chair)?: A Little Help needed to walk in hospital room?: A Lot Help needed climbing 3-5 steps with a railing? : Total 6 Click Score: 16    End of Session Equipment Utilized During Treatment: Gait belt Activity Tolerance: Patient tolerated treatment well Patient left: in bed;with call bell/phone within reach;with bed alarm set;with nursing/sitter in room Nurse Communication: Mobility status;Precautions PT Visit Diagnosis: Other abnormalities of gait and mobility (R26.89);Muscle weakness (generalized) (M62.81);Difficulty in walking, not elsewhere classified (R26.2);Pain    Time: 6378-5885 PT Time Calculation (min) (ACUTE ONLY): 48 min   Charges:   PT Evaluation $PT Eval Low Complexity: 1 Low PT Treatments $Therapeutic Exercise: 8-22 mins $Therapeutic Activity: 8-22 mins       Hendricks Limes, PT 04/09/20, 3:47 PM

## 2020-04-09 NOTE — Plan of Care (Signed)

## 2020-04-09 NOTE — Progress Notes (Addendum)
I have been unable to contact anyone from Case management to assist with transport to group home.   Group home name and number not listed in the case manager notes, so uncertain who to contact.   Charge nurse also tried to contact someone on call.  Unable to get anyone.   Called [facility] and Chiropodist of unit.   AD trying to contact someone from [facility].    Most likely patient is going to have to stay the night.  MD and patient is aware.    Will continue to monitor urine output.  Patient has not urinated anymore since small amount at 5 pm.  Night nurse and tech made aware.  Will continue to monitor bladder scans.

## 2020-04-09 NOTE — Evaluation (Signed)
Clinical/Bedside Swallow Evaluation Patient Details  Name: Gregory Hall MRN: [id number] Date of Birth: Dec 08, 1955  Today's Date: 04/09/2020 Time: SLP Start Time (ACUTE ONLY): 0945 SLP Stop Time (ACUTE ONLY): 1005 SLP Time Calculation (min) (ACUTE ONLY): 20 min  Past Medical History:  Past Medical History:  Diagnosis Date  . AKI (acute kidney injury) (HCC) 09/12/2019  . CAD (coronary artery disease)    s/p PTCA and stent x2  . Chest pain   . CHF (congestive heart failure) (HCC)   . Chronic pain syndrome   . COPD (chronic obstructive pulmonary disease) (HCC)   . Degenerative cervical disc   . Depression   . Diabetes mellitus without complication (HCC)   . Dyslipidemia   . GERD (gastroesophageal reflux disease)   . Hernia 2014  . Hypertension   . MRSA (methicillin resistant staph aureus) culture positive 2011  . Neuropathy   . Nutcracker esophagus   . Rectus diastasis 07/19/2012   Past Surgical History:  Past Surgical History:  Procedure Laterality Date  . BACK SURGERY  2012  . CARDIOVERSION N/A 09/26/2019   Procedure: CARDIOVERSION;  Surgeon: Antonieta Iba, MD;  Location: [facility] ORS;  Service: Cardiovascular;  Laterality: N/A;  . CHOLECYSTECTOMY    . COLONOSCOPY  Jan 2014   [name]  . CORONARY ANGIOPLASTY WITH STENT PLACEMENT  2009   stents x2, in [place], [place]  . FOOT SURGERY     Right  . NECK SURGERY    . SPINE SURGERY  2012,2013  . TONSILLECTOMY     HPI:  65yo male admitted 04/07/20 from SNF with AMS. PMH: COPD stage 4, current smoker, chronic respiratory failure with hypoxia, AKI, heart disease, heart failure, GERD, DLD, HTN, nutcracker esophagus (strong spasms), MRSA, CKD3a, CAD, DM2, PAFib, chronic pain. CXR = mild BLL airspace opacity   Assessment / Plan / Recommendation Clinical Impression  Pt seen at bedside for assessment of swallow function and safety. Pt has a history of GERD and nutcracker esophagus, for which he takes PPI daily. CN exam unremarkable.  Pt is edentulous, and reports his dentures are at home. Pt was observed with thin liquids, solid textures, and multiple PO medications (RN present). No oral issues or overt s/s aspiration observed. Suspect primary esophageal dysphagia. Pt was encouraged to begin meals with a warm liquid to facilitate esophageal motility. Recommend consideration of an esophageal work up to determine nature and extent of esophageal dysmotility. Pt verbalized a desire to be DC'd home today. Esophageal study could be completed as an outpatient and not interfere with DC. RN and MD informed. No further ST intervention recommended at this time. Please reconsult if needs arise.    SLP Visit Diagnosis: Dysphagia, unspecified (R13.10)    Aspiration Risk  Mild aspiration risk    Diet Recommendation Thin liquid;Regular   Liquid Administration via: Cup;Straw Medication Administration: Whole meds with liquid Supervision: Patient able to self feed Compensations: Slow rate;Small sips/bites Postural Changes: Seated upright at 90 degrees;Remain upright for at least 30 minutes after po intake    Other  Recommendations Recommended Consults: Consider GI evaluation;Consider esophageal assessment Oral Care Recommendations: Oral care BID   Follow up Recommendations None          Prognosis Prognosis for Safe Diet Advancement: Good      Swallow Study   General Date of Onset: 04/07/20 HPI: 65yo male admitted 04/07/20 from SNF with AMS. PMH: COPD stage 4, current smoker, chronic respiratory failure with hypoxia, AKI, heart disease, heart failure, GERD, DLD,  HTN, nutcracker esophagus (strong spasms), MRSA, CKD3a, CAD, DM2, PAFib, chronic pain. CXR = mild BLL airspace opacity Type of Study: Bedside Swallow Evaluation Previous Swallow Assessment: none Diet Prior to this Study: Regular Temperature Spikes Noted: No Respiratory Status: Nasal cannula History of Recent Intubation: No Behavior/Cognition: Alert;Cooperative;Pleasant  mood Oral Cavity Assessment: Within Functional Limits Oral Care Completed by SLP: No Oral Cavity - Dentition: Dentures, not available;Edentulous Vision: Functional for self-feeding Self-Feeding Abilities: Able to feed self Patient Positioning: Upright in bed Baseline Vocal Quality: Normal Volitional Swallow: Able to elicit    Oral/Motor/Sensory Function Overall Oral Motor/Sensory Function: Within functional limits   Ice Chips Ice chips: Not tested   Thin Liquid Thin Liquid: Within functional limits Presentation: Straw    Nectar Thick Nectar Thick Liquid: Not tested   Honey Thick Honey Thick Liquid: Not tested   Puree Puree: Not tested   Solid     Solid: Within functional limits Presentation: Self Fed     Gregory Hall B. Murvin Natal, [facility], CCC-SLP Speech Language Pathologist  Leigh Aurora 04/09/2020,11:12 AM

## 2020-04-09 NOTE — Progress Notes (Addendum)
Patient finally urinated a very small amount  in condom cath. Not really enough to measure.  Was going to discharge by way of taxi.   Per patient- he is not able to go by taxi.  Will need EMS.   Secure chatted CM, no response.  Called and left message.

## 2020-04-09 NOTE — NC FL2 (Signed)
[place] MEDICAID FL2 LEVEL OF CARE SCREENING TOOL     IDENTIFICATION  Patient Name: Gregory Hall Birthdate: Jan 13, 1955 Sex: male Admission Date (Current Location): 04/07/2020  [place] and [place] Number:  Chiropodist and Address:  [facility], [address] [place], [place] [address]      Provider Number: [id number]  Attending Physician Name and Address:  Leatha Gilding, MD  Relative Name and Phone Number:       Current Level of Care: Hospital Recommended Level of Care: Other (Comment) (group home) Prior Approval Number:    Date Approved/Denied:   PASRR Number:    Discharge Plan: Other (Comment) (Group Home)    Current Diagnoses: Patient Active Problem List   Diagnosis Date Noted  . Altered mental status 04/08/2020  . AMS (altered mental status) 04/08/2020  . Prolonged QT interval 04/08/2020  . Acute metabolic encephalopathy 04/08/2020  . Continuous tobacco abuse 03/20/2020  . AKI (acute kidney injury) (HCC) 02/17/2020  . Diabetes mellitus without complication (HCC)   . Diastolic dysfunction with chronic heart failure (HCC)   . Near syncope   . Acute respiratory failure (HCC) 12/13/2019  . Palliative care by specialist   . DNR (do not resuscitate)   . Acute CHF (congestive heart failure) (HCC) 10/08/2019  . Chronic respiratory failure with hypoxia (HCC) 10/08/2019  . Atrial flutter (HCC)   . Acute on chronic respiratory failure with hypoxia (HCC) 09/13/2019  . Acute diastolic CHF (congestive heart failure) (HCC) 09/12/2019  . NSTEMI (non-ST elevated myocardial infarction) (HCC) 09/12/2019  . Acute kidney failure, unspecified (HCC) 09/12/2019  . Diabetes mellitus type 2, uncontrolled, with complications (HCC) 06/10/2019  . Chronic pain syndrome 06/10/2019  . COPD exacerbation (HCC) 06/10/2019  . COPD with acute exacerbation (HCC) 06/09/2019  . CAD (coronary artery disease) 06/09/2019  . HTN (hypertension)  06/09/2019  . Acute on chronic respiratory failure with hypoxia and hypercapnia (HCC) 06/09/2019  . Anxiety 06/09/2019  . Chronic prescription opiate use 06/09/2019  . Hyperglycemia 06/09/2019  . Subclavian artery stenosis, left (HCC) 08/20/2014  . Rectus diastasis 07/19/2012  . Hernia   . Edema 05/10/2011  . Goals of care, counseling/discussion 10/06/2010  . SMOKER 11/25/2009  . CAROTID BRUIT, RIGHT 11/24/2009  . Chest pain 11/24/2009  . Hyperlipidemia 03/30/2009  . Coronary atherosclerosis 03/30/2009  . HYPERTENSION, HX OF 03/30/2009    Orientation RESPIRATION BLADDER Height & Weight     Self,Time,Situation,Place  O2 ([place] 4L) Continent Weight: 207 lb 3.7 oz (94 kg) Height:  6' (182.9 cm)  BEHAVIORAL SYMPTOMS/MOOD NEUROLOGICAL BOWEL NUTRITION STATUS      Continent Diet (regular diet, thin liquids)  AMBULATORY STATUS COMMUNICATION OF NEEDS Skin   Independent Verbally Normal                       Personal Care Assistance Level of Assistance  Bathing,Feeding,Dressing Bathing Assistance: Independent Feeding assistance: Independent Dressing Assistance: Independent     Functional Limitations Info  Sight,Hearing,Speech Sight Info: Adequate Hearing Info: Adequate Speech Info: Adequate    SPECIAL CARE FACTORS FREQUENCY                       Contractures Contractures Info: Not present    Additional Factors Info  Code Status,Allergies Code Status Info: Full Code Allergies Info: Acetaminophen, Fentanyl, Nsaids           Current Medications (04/09/2020):  This is the current hospital active medication list  Current Facility-Administered Medications  Medication Dose Route Frequency Provider Last Rate Last Admin  . albuterol (PROVENTIL) (2.5 MG/3ML) 0.083% nebulizer solution 2.5 mg  2.5 mg Nebulization Q6H Leatha Gilding, MD   2.5 mg at 04/09/20 0832  . atorvastatin (LIPITOR) tablet 40 mg  40 mg Oral Daily Gertha Calkin, MD   40 mg at 04/08/20 0908  .  budesonide (PULMICORT) nebulizer solution 0.5 mg  0.5 mg Nebulization BID Irena Cords V, MD   0.5 mg at 04/09/20 0830  . cefTRIAXone (ROCEPHIN) 1 g in sodium chloride 0.9 % 100 mL IVPB  1 g Intravenous Q24H Gertha Calkin, MD   Stopped at 04/09/20 0207  . Chlorhexidine Gluconate Cloth 2 % PADS 6 each  6 each Topical Daily Gherghe, Costin M, MD      . doxycycline (VIBRAMYCIN) 100 mg in sodium chloride 0.9 % 250 mL IVPB  100 mg Intravenous Q12H Leatha Gilding, MD   Stopped at 04/09/20 0426  . DULoxetine (CYMBALTA) DR capsule 60 mg  60 mg Oral Daily Gertha Calkin, MD   60 mg at 04/08/20 0908  . glycopyrrolate (ROBINUL) tablet 1 mg  1 mg Oral TID Gertha Calkin, MD   1 mg at 04/08/20 2155  . insulin aspart (novoLOG) injection 0-6 Units  0-6 Units Subcutaneous TID WC Gertha Calkin, MD   2 Units at 04/08/20 [id number]  . ipratropium-albuterol (DUONEB) 0.5-2.5 (3) MG/3ML nebulizer solution 3 mL  3 mL Nebulization Q6H PRN Gertha Calkin, MD   3 mL at 04/08/20 0113  . lamoTRIgine (LAMICTAL) tablet 100 mg  100 mg Oral Daily Gertha Calkin, MD   100 mg at 04/08/20 0908  . magnesium oxide (MAG-OX) tablet 400 mg  400 mg Oral Daily Gertha Calkin, MD   400 mg at 04/08/20 0908  . MEDLINE mouth rinse  15 mL Mouth Rinse BID Pamella Pert M, MD      . midodrine (PROAMATINE) tablet 10 mg  10 mg Oral BID Irena Cords V, MD   10 mg at 04/08/20 2155  . nicotine (NICODERM CQ - dosed in mg/24 hours) patch 14 mg  14 mg Transdermal Daily Gertha Calkin, MD   14 mg at 04/08/20 0907  . nitroGLYCERIN (NITROSTAT) SL tablet 0.4 mg  0.4 mg Sublingual Q5 min PRN Gertha Calkin, MD      . oxyCODONE (Oxy IR/ROXICODONE) immediate release tablet 10 mg  10 mg Oral Q4H PRN Leatha Gilding, MD      . pantoprazole (PROTONIX) injection 40 mg  40 mg Intravenous Q24H Gertha Calkin, MD   40 mg at 04/09/20 0121  . potassium chloride (KLOR-CON) CR tablet 30 mEq  30 mEq Oral Once Leatha Gilding, MD      . predniSONE (DELTASONE) tablet 60 mg  60 mg  Oral Q breakfast Gertha Calkin, MD   60 mg at 04/08/20 0754  . pregabalin (LYRICA) capsule 150 mg  150 mg Oral BID Leatha Gilding, MD   150 mg at 04/08/20 2137  . rivaroxaban (XARELTO) tablet 20 mg  20 mg Oral Q supper Gertha Calkin, MD   20 mg at 04/08/20 1751  . roflumilast (DALIRESP) tablet 250 mcg  250 mcg Oral Daily Gertha Calkin, MD   250 mcg at 04/08/20 1018  . tamsulosin (FLOMAX) capsule 0.4 mg  0.4 mg Oral QPC supper Gertha Calkin, MD   0.4 mg at 04/08/20 1751  .  umeclidinium bromide (INCRUSE ELLIPTA) 62.5 MCG/INH 1 puff  1 puff Inhalation Daily Leatha Gilding, MD   1 puff at 04/08/20 1019     Discharge Medications: Please see discharge summary for a list of discharge medications.  Relevant Imaging Results:  Relevant Lab Results:   Additional Information CNO:[id number]  Maree Krabbe, LCSW

## 2020-04-09 NOTE — TOC Progression Note (Signed)
Transition of Care [facility]) - Progression Note    Patient Details  Name: Vivan Agostino MRN: [id number] Date of Birth: 1955-12-17  Transition of [facility]) CM/SW Contact  Maree Krabbe, LCSW Phone Number: 04/09/2020, 3:08 PM  Clinical Narrative: CSW spoke with Group Home and they are expecting pt. DC ppwk faxed--received confirmation. Taxi Voucher provided.           Expected Discharge Plan and Services           Expected Discharge Date: 04/09/20                                     Social Determinants of Health (SDOH) Interventions    Readmission Risk Interventions Readmission Risk Prevention Plan 02/18/2020 12/15/2019 09/24/2019  Transportation Screening Complete Complete Complete  PCP or Specialist Appt within 3-5 Days - - -  HRI or Home Care Consult - - -  Social Work Consult for Recovery Care Planning/Counseling - - -  Palliative Care Screening - - -  Medication Review Oceanographer) (No Data) Complete Complete  PCP or Specialist appointment within 3-5 days of discharge Complete - Complete  HRI or Home Care Consult Complete Complete Complete  SW Recovery Care/Counseling Consult Complete - -  Palliative Care Screening Not Applicable Not Applicable Complete  Skilled Nursing Facility Not Applicable Not Applicable Complete  Some recent data might be hidden

## 2020-04-09 NOTE — Discharge Summary (Addendum)
Physician Discharge Summary  Gregory Hall UJW:[id number] DOB: 1955/02/02 DOA: 04/07/2020  PCP: Koren Bound, NP  Admit date: 04/07/2020 Discharge date: 04/10/2020  Admitted From: ALF Disposition:  ALF  Originally discharged on 4/1 but could not go due to transportation issues. Seen and examined this morning, stable for discharge.   Recommendations for Outpatient Follow-up:  1. Follow up with PCP in 1-2 weeks 2. Due to acute kidney injury and hypokalemia his Demadex dose has been decreased and he was placed on potassium supplement 3. Continue doxycycline for 3 additional days 4. Recommend outpatient palliative follow-up  Home Health: none Equipment/Devices: none  Discharge Condition: stable CODE STATUS: Full code Diet recommendation: heart healthy  HPI: Per admitting MD, Gregory Hall is a 65 y.o. male seen in ed with complaints of AMS. Pt coming from nursing home, HPI is limited as pt is Altered with GCS of 3. Pt responded to trial dose of narcan and per edmd and will put pt on Narcan drip. AbG does show hypercarbia and AKI. Per edmd reports EMS was called to SNF and pt initially refused transport to hospital but 5 min later he was altered and unresponsive.  Pt has past medical history of stage IV COPD, current smoker, chronic respiratory failure with hypoxia on 4 L at home.  Patient sees pulmonary Dr. Jayme Cloud.  Patient also has past medical history of acute kidney injury in the past, heart disease, heart failure, this GERD, dyslipidemia and hypertension, nutcracker esophagus, MRSA.  Hospital Course / Discharge diagnoses: Principal problem Acute metabolic encephalopathy -this is likely multifactorial in the setting of worsening hypercarbic respiratory failure as well as use of narcotics at home, in addition to that patient nonadherent to his home trilogy BiPAP.  He was placed on BiPAP, received Narcan and improved quite rapidly, by hospital day 2 he was back to  baseline, alert and oriented x4.  Active problems Acute on chronic hypoxic and hypercarbic respiratory failure, COPD exacerbation-patient placed on BiPAP, ABG did show chronic hypercarbia but given normal pH does not be at all acute as he is well compensated.    He was wheezing on admission, was placed on antibiotics and will do a short course, he was also placed on steroids which he is on chronically.  Currently respiratory status is returned to baseline, patient reports that he will be compliant with his trilogy BiPAP; has not been yet using it, thus this is likely why he is in the hospital again in such short time. Chronic pain -Extensive discussion with the patient on the day of discharge, he has been on opioids for a long time.  He has tolerated those well in the past but suspect that in the setting of overall worsening COPD and ongoing tobacco abuse he may not be as tolerant as he was in the past.  He is very resistant about changing his chronic opioids however I do recommend at this point slight lowering of his dose at least for few days.  I have discontinued his baclofen also Acute kidney injury on chronic kidney disease stage IIIa hypokalemia-Likely in the setting of acute illness, poor p.o. intake and use of Demadex at home.  His Demadex dose has been decreased to 20 mg and he was placed on low-dose potassium supplementation.  Please recheck labs in 3 to 4 days and consider resumption of prior regimen if renal function is stable. Chronic diastolic CHF -he was tried on admission and acute kidney injury, his diuretics were held and he  was placed on fluids.  He is euvolemic on discharge, as above continue lower dose Demadex along with potassium supplementation and recheck labs in 3 to 4 days Hypokalemia -Continue to monitor and replete Tobacco use -counseled extensively for cessation prior to discharge, tells me he is only using 4 cigarettes a day but understands that he needs to quit  completely Normocytic anemia -Of chronic disease Type 2 diabetes mellitus -Continue home regimen  Chronic hypotension -Continue home midodrine Hyperlipidemia -Continue atorvastatin Paroxysmal A. Fib -Continue amiodarone, anticoagulation with Xarelto BPH -continue Flomax. Intermittent urinary retention worse when confused but now slowly improving. Have recommended outpatient urology follow up  Sepsis ruled out   Discharge Instructions   Allergies as of 04/10/2020      Reactions   Acetaminophen Other (See Comments)   Reaction:  Unknown  Kidney failure Told not to take from home M.D. Related to kidney and renal failure   Fentanyl    Nsaids Other (See Comments)   Reaction:  Unknown  Kidney failure Patient states not to take from home M.D. Related to kidney and renal failure. Other reaction(s): Unknown      Medication List    STOP taking these medications   baclofen 20 MG tablet Commonly known as: LIORESAL   lidocaine 5 % Commonly known as: LIDODERM     TAKE these medications   amiodarone 200 MG tablet Commonly known as: PACERONE Take 1 tablet (200 mg total) by mouth daily.   atorvastatin 40 MG tablet Commonly known as: LIPITOR Take 1 tablet (40 mg total) by mouth daily.   budesonide 0.5 MG/2ML nebulizer solution Commonly known as: PULMICORT Take 0.5 mg by nebulization 2 (two) times daily.   doxycycline 100 MG capsule Commonly known as: VIBRAMYCIN Take 1 capsule (100 mg total) by mouth 2 (two) times daily for 3 days.   DULoxetine 60 MG capsule Commonly known as: CYMBALTA Take 60 mg by mouth daily.   Ensure Max Protein Liqd Take 330 mLs (11 oz total) by mouth 2 (two) times daily between meals.   fexofenadine 180 MG tablet Commonly known as: ALLEGRA Take 180 mg by mouth daily.   fluticasone 50 MCG/ACT nasal spray Commonly known as: FLONASE Place 1 spray into both nostrils 2 (two) times daily.   glyBURIDE 2.5 MG tablet Commonly known as: DIABETA Take 2.5  mg by mouth daily.   glycopyrrolate 1 MG tablet Commonly known as: ROBINUL Take 1 tablet (1 mg total) by mouth 3 (three) times daily.   guaiFENesin 600 MG 12 hr tablet Commonly known as: MUCINEX Take 1 tablet (600 mg total) by mouth 2 (two) times daily.   insulin glargine 100 UNIT/ML injection Commonly known as: LANTUS Inject 11 Units into the skin at bedtime.   insulin lispro 100 UNIT/ML injection Commonly known as: HUMALOG Inject 4 Units into the skin See admin instructions. Inject 4 units with blood sugar 150-250; inject 6 units 251-350; greater than 351 call provider; Hold for FSBG <150   ipratropium-albuterol 0.5-2.5 (3) MG/3ML Soln Commonly known as: DUONEB Take 3 mLs by nebulization every 6 (six) hours as needed. What changed: reasons to take this   lactulose 10 GM/15ML solution Commonly known as: CHRONULAC Take 30 mLs by mouth daily as needed.   lamoTRIgine 100 MG tablet Commonly known as: LAMICTAL Take 100 mg by mouth daily.   magnesium oxide 400 (241.3 Mg) MG tablet Commonly known as: MAG-OX Take 1 tablet by mouth daily.   midodrine 10 MG tablet Commonly known as:  PROAMATINE Take 1 tablet (10 mg total) by mouth 2 (two) times daily. Take if top blood pressure reading is less than 100   NAC 600 MG Caps Generic drug: Acetylcysteine Take 600 mg by mouth daily.   nicotine 21 mg/24hr patch Commonly known as: NICODERM CQ - dosed in mg/24 hours Place 21 mg onto the skin daily.   Nitrostat 0.4 MG SL tablet Generic drug: nitroGLYCERIN DISSOLVE (1) TABLET UNDER TONGUE AS NEEDED TO RELIEVE CHEST PAIN. MAYREPEAT EVERY 5 MINUTES. What changed: See the new instructions.   ondansetron 4 MG disintegrating tablet Commonly known as: ZOFRAN-ODT Take 1 tablet (4 mg total) by mouth every 8 (eight) hours as needed for nausea or vomiting.   Oxycodone HCl 10 MG Tabs Take 1 tablet (10 mg total) by mouth 3 (three) times daily. What changed:   when to take this  additional  instructions   oxymetazoline 0.05 % nasal spray Commonly known as: AFRIN Place 2 sprays into both nostrils 2 (two) times daily as needed for congestion.   pantoprazole 40 MG tablet Commonly known as: PROTONIX Take 40 mg by mouth daily.   potassium chloride 10 MEQ tablet Commonly known as: KLOR-CON Take 1 tablet (10 mEq total) by mouth daily.   predniSONE 5 MG tablet Commonly known as: DELTASONE Take 2.5 mg by mouth daily.   pregabalin 200 MG capsule Commonly known as: LYRICA Take 1 capsule (200 mg total) by mouth 3 (three) times daily.   rivaroxaban 20 MG Tabs tablet Commonly known as: XARELTO Take 20 mg by mouth daily with supper.   roflumilast 500 MCG Tabs tablet Commonly known as: DALIRESP Take 0.5 tablets (250 mcg total) by mouth daily.   SALONPAS PAIN RELIEF PATCH EX Apply 1 patch topically every 12 (twelve) hours as needed.   senna-docusate 8.6-50 MG tablet Commonly known as: Senokot-S Take 1 tablet by mouth daily.   sodium chloride 0.65 % Soln nasal spray Commonly known as: OCEAN Place 1 spray into both nostrils as needed for congestion.   tamsulosin 0.4 MG Caps capsule Commonly known as: FLOMAX Take 1 capsule (0.4 mg total) by mouth daily after supper. What changed: when to take this   torsemide 20 MG tablet Commonly known as: DEMADEX Take 1 tablet (20 mg total) by mouth daily. What changed: how much to take   traZODone 150 MG tablet Commonly known as: DESYREL Take 150 mg by mouth at bedtime.   Trelegy Ellipta 100-62.5-25 MCG/INH Aepb Generic drug: Fluticasone-Umeclidin-Vilant Inhale 1 puff into the lungs daily.   Ubrelvy 100 MG Tabs Generic drug: Ubrogepant Take 100 mg by mouth daily.   varenicline 1 MG tablet Commonly known as: CHANTIX Take 1 mg by mouth 2 (two) times daily.   Vitamin D3 50 MCG (2000 UT) Tabs Take 2,000 Units by mouth daily.       Follow-up Information    Sondra Come, MD. Schedule an appointment as soon as  possible for a visit in 1 week(s).   Specialty: Urology Contact information: [address] [place] [address] [phone]               Consultations:  None   Procedures/Studies:  DG Chest 1 View  Result Date: 03/20/2020 CLINICAL DATA:  Shortness of breath EXAM: CHEST  1 VIEW COMPARISON:  02/19/2020 FINDINGS: The heart size and mediastinal contours are within normal limits. Mild, diffuse bilateral interstitial opacity. Bandlike scarring or atelectasis of the bilateral lung bases. The visualized skeletal structures are unremarkable. IMPRESSION:  1. Mild, diffuse bilateral interstitial opacity, most consistent with mild edema. 2. Bandlike scarring or atelectasis of the bilateral lung bases. Electronically Signed   By: Lauralyn Primes M.D.   On: 03/20/2020 11:52   CT Head Wo Contrast  Result Date: 04/07/2020 CLINICAL DATA:  Altered mental status, unresponsive EXAM: CT HEAD WITHOUT CONTRAST TECHNIQUE: Contiguous axial images were obtained from the base of the skull through the vertex without intravenous contrast. COMPARISON:  MRI 03/15/2020, CT brain 10/22/2019 FINDINGS: Brain: No acute territorial infarction, hemorrhage or new intracranial mass is visualized. Mild atrophy. 11 mm cyst or cystic lesion within the anterior temporal lobe on the right without change. Stable ventricle size Vascular: No hyperdense vessels.  Carotid vascular calcification Skull: Normal. Negative for fracture or focal lesion. Sinuses/Orbits: No acute finding. Other: None IMPRESSION: 1. No CT evidence for acute intracranial abnormality. 2. Atrophy. 3. Stable 11 mm cyst or cystic lesion within the right anterior temporal lobe, see MRI report 03/15/2020. Electronically Signed   By: Jasmine Pang M.D.   On: 04/07/2020 23:15   MR BRAIN W WO CONTRAST  Result Date: 03/15/2020 CLINICAL DATA:  Follow-up MRI for brain cyst. EXAM: MRI HEAD WITHOUT AND WITH CONTRAST TECHNIQUE: Multiplanar, multiecho pulse sequences of the  brain and surrounding structures were obtained without and with intravenous contrast. CONTRAST:  9mL GADAVIST GADOBUTROL 1 MMOL/ML IV SOLN COMPARISON:  January 22, 2020. 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 Similar to prior, it demonstrates mild surrounding edema and complete FLAIR suppression. No substantial mass effect. It is in close proximity to the right MCA branch vessels. No acute hemorrhage. No hydrocephalus. No acute infarct. No midline shift. No extra-axial fluid collections. Vascular: Major arterial flow voids are maintained at the skull base. Skull and upper cervical spine: Normal marrow signal. Sinuses/Orbits: Largely clear. Other: No sizable mastoid effusions. IMPRESSION: Similar 8 x 11 mm well-defined cyst in the anterior right temporal lobe, which is nonenhancing. Given the absence of contrast enhancement and characteristic appearance/location, this is favored to represent an anterior temporal lobe dilated perivascular space (which often do demonstrate surrounding edema). Recommend follow-up MRI with contrast in approximately 6 months to ensure stability and exclude malignancy. Electronically Signed   By: Feliberto Harts MD   On: 03/15/2020 14:42   DG Chest Port 1 View  Result Date: 04/07/2020 CLINICAL DATA:  Altered mental status EXAM: PORTABLE CHEST 1 VIEW COMPARISON:  03/21/2020 FINDINGS: Cardiac shadow is within normal limits. Spinal stimulator is again noted and stable. Aortic calcifications are seen. The lungs are well aerated bilaterally with mild patchy airspace opacity in the bases. Postsurgical changes in the cervical spine are noted. Left subclavian arterial stent is seen as well. IMPRESSION: Mild bibasilar airspace opacity. Electronically Signed   By: Alcide Clever M.D.   On: 04/07/2020 23:21   DG Chest Port 1 View  Result Date: 03/21/2020 CLINICAL DATA:  Respiratory failure. HISTORY OF CHF, DM, HTN, COPD, CAD,  GERD. EXAM: PORTABLE CHEST 1 VIEW COMPARISON:  Chest x-ray 03/20/2020. FINDINGS: The heart size and mediastinal contours are unchanged. Aortic arch calcifications. Left brachiocephalic stent. Spinal cord stimulator visualized overlying mediastinum. Streaky bibasilar airspace opacities likely represent atelectasis. Increased interstitial markings with Kerley B lines. No pleural effusion. No pneumothorax. Cervical surgical hardware. No acute osseous abnormality. IMPRESSION: 1. Pulmonary edema. 2. Bibasilar streaky airspace opacities likely represent atelectasis. Electronically Signed   By: Tish Frederickson M.D.   On: 03/21/2020 05:37  ECHOCARDIOGRAM COMPLETE  Result Date: 03/21/2020    ECHOCARDIOGRAM REPORT   Patient Name:   EKAM BESSON Date of Exam: 03/21/2020 Medical Rec #:  [id number]             Height:       72.0 in Accession #:    [id number]            Weight:       207.7 lb Date of Birth:  10/10/1955             BSA:          2.165 m Patient Age:    78 years              BP:           161/61 mmHg Patient Gender: M                     HR:           87 bpm. Exam Location:  [facility] Procedure: 2D Echo and Intracardiac Opacification Agent Indications:     Pulmonary Hypertension I27.2  History:         Patient has prior history of Echocardiogram examinations, most                  recent 09/26/2019.  Sonographer:     Wonda Cerise RDCS Referring Phys:  [id number] CAR[address] Saliva Diagnosing Phys: Julien Nordmann MD  Sonographer Comments: Technically difficult study due to poor echo windows. Image acquisition challenging due to patient body habitus and Image acquisition challenging due to respiratory motion. IMPRESSIONS  1. Left ventricular ejection fraction, by estimation, is 60 to 65%. The left ventricle has normal function. The left ventricle has no regional wall motion abnormalities. Left ventricular diastolic parameters are consistent with Grade I diastolic dysfunction (impaired relaxation).  2. Right  ventricular systolic function is normal. The right ventricular size is normal. Tricuspid regurgitation signal is inadequate for assessing PA pressure.  3. The mitral valve was not well visualized. No evidence of mitral valve regurgitation. FINDINGS  Left Ventricle: Left ventricular ejection fraction, by estimation, is 60 to 65%. The left ventricle has normal function. The left ventricle has no regional wall motion abnormalities. Definity contrast agent was given IV to delineate the left ventricular  endocardial borders. The left ventricular internal cavity size was normal in size. There is no left ventricular hypertrophy. Left ventricular diastolic parameters are consistent with Grade I diastolic dysfunction (impaired relaxation). Right Ventricle: The right ventricular size is normal. No increase in right ventricular wall thickness. Right ventricular systolic function is normal. Tricuspid regurgitation signal is inadequate for assessing PA pressure. Left Atrium: Left atrial size was normal in size. Right Atrium: Right atrial size was normal in size. Pericardium: There is no evidence of pericardial effusion. Mitral Valve: The mitral valve was not well visualized. No evidence of mitral valve regurgitation. No evidence of mitral valve stenosis. Tricuspid Valve: The tricuspid valve is normal in structure. Tricuspid valve regurgitation is not demonstrated. No evidence of tricuspid stenosis. Aortic Valve: The aortic valve was not well visualized. Aortic valve regurgitation is not visualized. No aortic stenosis is present. Aortic valve peak gradient measures 7.1 mmHg. Pulmonic Valve: The pulmonic valve was normal in structure. Pulmonic valve regurgitation is not visualized. No evidence of pulmonic stenosis. Aorta: The aortic root is normal in size and structure. Venous: The inferior vena cava is normal in size with greater than 50% respiratory variability, suggesting  right atrial pressure of 3 mmHg. IAS/Shunts: No atrial  level shunt detected by color flow Doppler.  LEFT VENTRICLE PLAX 2D LVIDd:         4.62 cm  Diastology LVIDs:         2.99 cm  LV e' medial:    5.66 cm/s LV PW:         1.26 cm  LV E/e' medial:  14.4 LV IVS:        1.14 cm  LV e' lateral:   9.25 cm/s LVOT diam:     2.00 cm  LV E/e' lateral: 8.8 LV SV:         59 LV SV Index:   27 LVOT Area:     3.14 cm  RIGHT VENTRICLE RV Basal diam:  3.84 cm TAPSE (M-mode): 2.7 cm LEFT ATRIUM           Index       RIGHT ATRIUM           Index LA diam:      3.30 cm 1.52 cm/m  RA Area:     13.90 cm LA Vol (A2C): 25.7 ml 11.87 ml/m RA Volume:   32.20 ml  14.87 ml/m LA Vol (A4C): 15.5 ml 7.16 ml/m  AORTIC VALVE AV Area (Vmax): 2.01 cm AV Vmax:        133.00 cm/s AV Peak Grad:   7.1 mmHg LVOT Vmax:      84.90 cm/s LVOT Vmean:     53.300 cm/s LVOT VTI:       0.189 m  AORTA Ao Root diam: 3.40 cm MITRAL VALVE MV Area (PHT): 3.34 cm    SHUNTS MV Decel Time: 227 msec    Systemic VTI:  0.19 m MV E velocity: 81.40 cm/s  Systemic Diam: 2.00 cm MV A velocity: 96.40 cm/s MV E/A ratio:  0.84 Julien Nordmann MD Electronically signed by Julien Nordmann MD Signature Date/Time: 03/21/2020/12:28:54 PM    Final    DG HIP UNILAT WITH PELVIS 2-3 VIEWS RIGHT  Result Date: 04/08/2020 CLINICAL DATA:  65 year old male with altered mental status, right lower extremity pain. EXAM: DG HIP (WITH OR WITHOUT PELVIS) 2-3V RIGHT COMPARISON:  CT Abdomen and Pelvis 02/17/2020. FINDINGS: Partially visible spinal stimulator device and lower lumbar fusion hardware. Femoral heads are normally located. Hip joint spaces appear symmetric and normal for age. Pelvis appears intact. SI joints appear symmetric and normal. AP and cross-table lateral views of the right hip. The proximal right femur appears intact. No acute osseous abnormality identified. Calcified femoral atherosclerosis and pelvic phleboliths. Visible bowel-gas pattern not significantly changed from last month. IMPRESSION: No acute osseous abnormality  identified about the right hip or pelvis. Electronically Signed   By: Odessa Fleming M.D.   On: 04/08/2020 07:57     Subjective: - no chest pain, shortness of breath, no abdominal pain, nausea or vomiting.   Discharge Exam: BP (!) 128/49 (BP Location: Right Arm)   Pulse 84   Temp 97.8 F (36.6 C)   Resp 18   Ht 6' (1.829 m)   Wt 93 kg   SpO2 97%   BMI 27.81 kg/m   General: Pt is alert, awake, not in acute distress Cardiovascular: RRR, S1/S2 +, no rubs, no gallops Respiratory: CTA bilaterally, no wheezing, no rhonchi Abdominal: Soft, NT, ND, bowel sounds + Extremities: no edema, no cyanosis   The results of significant diagnostics from this hospitalization (including imaging, microbiology, ancillary and laboratory) are listed below  for reference.     Microbiology: Recent Results (from the past 240 hour(s))  Resp Panel by RT-PCR (Flu A&B, Covid) Nasopharyngeal Swab     Status: None   Collection Time: 04/07/20 11:04 PM   Specimen: Nasopharyngeal Swab; Nasopharyngeal(NP) swabs in vial transport medium  Result Value Ref Range Status   SARS Coronavirus 2 by RT PCR NEGATIVE NEGATIVE Final    Comment: (NOTE) SARS-CoV-2 target nucleic acids are NOT DETECTED.  The SARS-CoV-2 RNA is generally detectable in upper respiratory specimens during the acute phase of infection. The lowest concentration of SARS-CoV-2 viral copies this assay can detect is 138 copies/mL. A negative result does not preclude SARS-Cov-2 infection and should not be used as the sole basis for treatment or other patient management decisions. A negative result may occur with  improper specimen collection/handling, submission of specimen other than nasopharyngeal swab, presence of viral mutation(s) within the areas targeted by this assay, and inadequate number of viral copies(<138 copies/mL). A negative result must be combined with clinical observations, patient history, and epidemiological information. The expected  result is Negative.  Fact Sheet for Patients:  BloggerCourse.com  Fact Sheet for Healthcare Providers:  SeriousBroker.it  This test is no t yet approved or cleared by the [place] FDA and  has been authorized for detection and/or diagnosis of SARS-CoV-2 by FDA under an Emergency Use Authorization (EUA). This EUA will remain  in effect (meaning this test can be used) for the duration of the COVID-19 declaration under Section 564(b)(1) of the Act, 21 U.S.C.section 360bbb-3(b)(1), unless the authorization is terminated  or revoked sooner.       Influenza A by PCR NEGATIVE NEGATIVE Final   Influenza B by PCR NEGATIVE NEGATIVE Final    Comment: (NOTE) The Xpert Xpress SARS-CoV-2/FLU/RSV plus assay is intended as an aid in the diagnosis of influenza from Nasopharyngeal swab specimens and should not be used as a sole basis for treatment. Nasal washings and aspirates are unacceptable for Xpert Xpress SARS-CoV-2/FLU/RSV testing.  Fact Sheet for Patients: BloggerCourse.com  Fact Sheet for Healthcare Providers: SeriousBroker.it  This test is not yet approved or cleared by the [place] FDA and has been authorized for detection and/or diagnosis of SARS-CoV-2 by FDA under an Emergency Use Authorization (EUA). This EUA will remain in effect (meaning this test can be used) for the duration of the COVID-19 declaration under Section 564(b)(1) of the Act, 21 U.S.C. section 360bbb-3(b)(1), unless the authorization is terminated or revoked.  Performed at [facility], [address] [place], [place] [address]   Culture, blood (routine x 2)     Status: None (Preliminary result)   Collection Time: 04/07/20 11:24 PM   Specimen: BLOOD  Result Value Ref Range Status   Specimen Description BLOOD RIGHT ANTECUBITAL  Final   Special Requests   Final    BOTTLES DRAWN AEROBIC AND  ANAEROBIC Blood Culture adequate volume   Culture   Final    NO GROWTH 1 DAY Performed at [facility], [address] [place], [place] [address]    Report Status PENDING  Incomplete  Culture, blood (routine x 2)     Status: None (Preliminary result)   Collection Time: 04/07/20 11:29 PM   Specimen: BLOOD  Result Value Ref Range Status   Specimen Description BLOOD BLOOD LEFT HAND  Final   Special Requests   Final    BOTTLES DRAWN AEROBIC AND ANAEROBIC Blood Culture results may not be optimal due to an excessive volume of  blood received in culture bottles   Culture   Final    NO GROWTH 1 DAY Performed at [facility], [address] [place], [place] [address]    Report Status PENDING  Incomplete  Resp Panel by RT-PCR (Flu A&B, Covid) Nasopharyngeal Swab     Status: None   Collection Time: 04/09/20 10:15 AM   Specimen: Nasopharyngeal Swab; Nasopharyngeal(NP) swabs in vial transport medium  Result Value Ref Range Status   SARS Coronavirus 2 by RT PCR NEGATIVE NEGATIVE Final    Comment: (NOTE) SARS-CoV-2 target nucleic acids are NOT DETECTED.  The SARS-CoV-2 RNA is generally detectable in upper respiratory specimens during the acute phase of infection. The lowest concentration of SARS-CoV-2 viral copies this assay can detect is 138 copies/mL. A negative result does not preclude SARS-Cov-2 infection and should not be used as the sole basis for treatment or other patient management decisions. A negative result may occur with  improper specimen collection/handling, submission of specimen other than nasopharyngeal swab, presence of viral mutation(s) within the areas targeted by this assay, and inadequate number of viral copies(<138 copies/mL). A negative result must be combined with clinical observations, patient history, and epidemiological information. The expected result is Negative.  Fact Sheet for Patients:  BloggerCourse.com  Fact  Sheet for Healthcare Providers:  SeriousBroker.it  This test is no t yet approved or cleared by the [place] FDA and  has been authorized for detection and/or diagnosis of SARS-CoV-2 by FDA under an Emergency Use Authorization (EUA). This EUA will remain  in effect (meaning this test can be used) for the duration of the COVID-19 declaration under Section 564(b)(1) of the Act, 21 U.S.C.section 360bbb-3(b)(1), unless the authorization is terminated  or revoked sooner.       Influenza A by PCR NEGATIVE NEGATIVE Final   Influenza B by PCR NEGATIVE NEGATIVE Final    Comment: (NOTE) The Xpert Xpress SARS-CoV-2/FLU/RSV plus assay is intended as an aid in the diagnosis of influenza from Nasopharyngeal swab specimens and should not be used as a sole basis for treatment. Nasal washings and aspirates are unacceptable for Xpert Xpress SARS-CoV-2/FLU/RSV testing.  Fact Sheet for Patients: BloggerCourse.com  Fact Sheet for Healthcare Providers: SeriousBroker.it  This test is not yet approved or cleared by the [place] FDA and has been authorized for detection and/or diagnosis of SARS-CoV-2 by FDA under an Emergency Use Authorization (EUA). This EUA will remain in effect (meaning this test can be used) for the duration of the COVID-19 declaration under Section 564(b)(1) of the Act, 21 U.S.C. section 360bbb-3(b)(1), unless the authorization is terminated or revoked.  Performed at [facility], [address] [place], [place] [address]      Labs: Basic Metabolic Panel: Recent Labs  Lab 04/07/20 2330 04/08/20 0105 04/08/20 0426 04/09/20 0431  NA 137  --  140 136  K 3.1*  --  2.9* 3.5  CL 86*  --  92* 94*  CO2 38*  --  36* 32  GLUCOSE 151*  --  134* 120*  BUN 33*  --  30* 20  CREATININE 1.97*  --  1.78* 1.28*  CALCIUM 8.6*  --  8.0* 7.9*  MG  --  1.9  --   --    Liver Function  Tests: Recent Labs  Lab 04/07/20 2330 04/08/20 0426 04/09/20 0431  AST 15 14* 15  ALT ALKPHOS 112 107 104  BILITOT 0.5 0.7 0.7  PROT 6.4* 5.5* 5.2*  ALBUMIN 3.3* 3.1* 2.7*   CBC: Recent Labs  Lab 04/07/20 2330 04/08/20 0426 04/09/20 0431  WBC 8.8 8.7 7.4  NEUTROABS 6.4  --   --   HGB 11.1* 10.3* 9.8*  HCT 37.2* 34.4* 32.8*  MCV 83.0 83.1 83.0  PLT 239 207 220   CBG: Recent Labs  Lab 04/08/20 2142 04/09/20 0929 04/09/20 1155 04/09/20 1632 04/09/20 2021  GLUCAP 111* 138* 293* 265* 324*   Hgb A1c No results for input(s): HGBA1C in the last 72 hours. Lipid Profile No results for input(s): CHOL, HDL, LDLCALC, TRIG, CHOLHDL, LDLDIRECT in the last 72 hours. Thyroid function studies No results for input(s): TSH, T4TOTAL, T3FREE, THYROIDAB in the last 72 hours.  Invalid input(s): FREET3 Urinalysis    Component Value Date/Time   COLORURINE YELLOW (A) 04/08/2020 0148   APPEARANCEUR CLEAR (A) 04/08/2020 0148   APPEARANCEUR Clear 10/28/2013 2215   LABSPEC 1.010 04/08/2020 0148   LABSPEC 1.002 10/28/2013 2215   PHURINE 5.0 04/08/2020 0148   GLUCOSEU NEGATIVE 04/08/2020 0148   GLUCOSEU Negative 10/28/2013 2215   HGBUR NEGATIVE 04/08/2020 0148   BILIRUBINUR NEGATIVE 04/08/2020 0148   BILIRUBINUR Negative 10/28/2013 2215   KETONESUR NEGATIVE 04/08/2020 0148   PROTEINUR NEGATIVE 04/08/2020 0148   UROBILINOGEN 1.0 03/29/2009 0840   NITRITE NEGATIVE 04/08/2020 0148   LEUKOCYTESUR NEGATIVE 04/08/2020 0148   LEUKOCYTESUR Negative 10/28/2013 2215    FURTHER DISCHARGE INSTRUCTIONS:   Get Medicines reviewed and adjusted: Please take all your medications with you for your next visit with your Primary MD   Laboratory/radiological data: Please request your Primary MD to go over all hospital tests and procedure/radiological results at the follow up, please ask your Primary MD to get all Hospital records sent to his/her office.   In some cases, they will be blood  work, cultures and biopsy results pending at the time of your discharge. Please request that your primary care M.D. goes through all the records of your hospital data and follows up on these results.   Also Note the following: If you experience worsening of your admission symptoms, develop shortness of breath, life threatening emergency, suicidal or homicidal thoughts you must seek medical attention immediately by calling 911 or calling your MD immediately  if symptoms less severe.   You must read complete instructions/literature along with all the possible adverse reactions/side effects for all the Medicines you take and that have been prescribed to you. Take any new Medicines after you have completely understood and accpet all the possible adverse reactions/side effects.    Do not drive when taking Pain medications or sleeping medications (Benzodaizepines)   Do not take more than prescribed Pain, Sleep and Anxiety Medications. It is not advisable to combine anxiety,sleep and pain medications without talking with your primary care practitioner   Special Instructions: If you have smoked or chewed Tobacco  in the last 2 yrs please stop smoking, stop any regular Alcohol  and or any Recreational drug use.   Wear Seat belts while driving.   Please note: You were cared for by a hospitalist during your hospital stay. Once you are discharged, your primary care physician will handle any further medical issues. Please note that NO REFILLS for any discharge medications will be authorized once you are discharged, as it is imperative that you return to your primary care physician (or establish a relationship with a primary care physician if you do not have one) for your post hospital discharge needs so that they can reassess your  need for medications and monitor your lab values.  Time coordinating discharge: 40 minutes  SIGNED:  Pamella Pert, MD, PhD 04/10/2020, 7:01 AM

## 2020-04-10 LAB — GLUCOSE, CAPILLARY
Glucose-Capillary: 176 mg/dL — ABNORMAL HIGH (ref 70–99)
Glucose-Capillary: 209 mg/dL — ABNORMAL HIGH (ref 70–99)
Glucose-Capillary: 291 mg/dL — ABNORMAL HIGH (ref 70–99)

## 2020-04-10 MED ORDER — POLYVINYL ALCOHOL 1.4 % OP SOLN
1.0000 [drp] | OPHTHALMIC | Status: DC | PRN
  Administered 2020-04-10 (×5): 1 [drp] via OPHTHALMIC
  Filled 2020-04-10: qty 15

## 2020-04-10 NOTE — TOC Progression Note (Addendum)
Transition of Care [facility]) - Progression Note    Patient Details  Name: Gregory Hall MRN: [id number] Date of Birth: 06-17-1955  [facility]) CM/SW Contact  Bing Quarry, RN Phone Number: 04/10/2020, 1:06 PM  Clinical Narrative: Provider inquired as to why patient had not discharged 04/09/20.   Per CM transition notes of 04/09/20, group home was aware and all paperwork has been sent with a taxi voucher for transport. However, when investigated the Unit RN stated they attempted to reach out to [facility] 04/09/20 as patient needed Non-emergency transport due to being WC bound and on chronic oxygen PTA from group home so patient was not discharged.   Attempts to reach group home this am without anyone answering. No VM option. Home accepted patient yesterday but CM needs to make sure someone there to accept patient return today. Will continue to reach out to them.   Contact info and address:  [phone]  [facility]  [address] [place], [place] [address]   133 pm Phone call was answered and patient was accepted to return to group home today by Lyman Bishop at [phone]. Will arrange Non Emergency EMS transport. Facesheet and Med. Nec. Form printed to Unit RN. EMS informed of oxygen needs. Gabriel Cirri RN CM      Expected Discharge Plan and Services           Expected Discharge Date: 04/09/20                                     Social Determinants of Health (SDOH) Interventions    Readmission Risk Interventions Readmission Risk Prevention Plan 02/18/2020 12/15/2019 09/24/2019  Transportation Screening Complete Complete Complete  PCP or Specialist Appt within 3-5 Days - - -  HRI or Home Care Consult - - -  Social Work Consult for Recovery Care Planning/Counseling - - -  Palliative Care Screening - - -  Medication Review Oceanographer) (No Data) Complete Complete  PCP or Specialist appointment within 3-5 days of  discharge Complete - Complete  HRI or Home Care Consult Complete Complete Complete  SW Recovery Care/Counseling Consult Complete - -  Palliative Care Screening Not Applicable Not Applicable Complete  Skilled Nursing Facility Not Applicable Not Applicable Complete  Some recent data might be hidden

## 2020-04-10 NOTE — Plan of Care (Signed)
  Problem: Health Behavior/Discharge Planning: Goal: Ability to manage health-related needs will improve Outcome: Progressing   Problem: Clinical Measurements: Goal: Respiratory complications will improve Outcome: Progressing   Problem: Clinical Measurements: Goal: Cardiovascular complication will be avoided Outcome: Progressing   Problem: Activity: Goal: Risk for activity intolerance will decrease Outcome: Progressing   Problem: Elimination: Goal: Will not experience complications related to urinary retention Outcome: Progressing   Problem: Safety: Goal: Ability to remain free from injury will improve Outcome: Progressing

## 2020-04-10 NOTE — Progress Notes (Signed)
Discussed discharge instructions with patient including medications and follow up appointments.   Sent medication list with patient.

## 2020-04-10 NOTE — TOC Transition Note (Signed)
Transition of Care [facility]) - CM/SW Discharge Note   Patient Details  Name: Gregory Hall MRN: [id number] Date of Birth: 12/04/1955  Transition of Care [facility]) CM/SW Contact:  Bing Quarry, RN Phone Number: 04/10/2020, 3:30 PM   Clinical Narrative:  Discharge back to group home of PTA. [facility] to resume services already in place. [facility] contacted to ensure oxygen set up PTA was in place/confirmed. [facility] to transport. Wheelchair DME at group home. Patient accepted by Tobey Grim Hold at [facility] at [phone]. Gabriel Cirri RN CM    Final next level of care: Home w Home Health Services Barriers to Discharge: Barriers Resolved   Patient Goals and CMS Choice        Discharge Placement                Patient to be transferred to facility by: [facility]   Patient and family notified of of transfer: 04/10/20 (Facility notified via Lyman Bishop)  Discharge Plan and Services                DME Arranged: N/A [facility])         HH Arranged: RN,OT,PT HH Agency: [facility] (Active patient but contaced [facility] to let them know of DC) Date HH Agency Contacted: 04/10/20 Time HH Agency Contacted: 1529 Representative spoke with at [facility] Agency: Feliberto Gottron  Social Determinants of Health (SDOH) Interventions     Readmission Risk Interventions Readmission Risk Prevention Plan 02/18/2020 12/15/2019 09/24/2019  Transportation Screening Complete Complete Complete  PCP or Specialist Appt within 3-5 Days - - -  HRI or Home Care Consult - - -  Social Work Consult for Recovery Care Planning/Counseling - - -  Palliative Care Screening - - -  Medication Review Oceanographer) (No Data) Complete Complete  PCP or Specialist appointment within 3-5 days of discharge Complete - Complete  HRI or Home Care Consult Complete Complete Complete  SW Recovery Care/Counseling Consult Complete - -  Palliative Care Screening Not Applicable Not Applicable Complete  Skilled  Nursing Facility Not Applicable Not Applicable Complete  Some recent data might be hidden

## 2020-04-13 LAB — CULTURE, BLOOD (ROUTINE X 2)
Culture: NO GROWTH
Culture: NO GROWTH
Special Requests: ADEQUATE

## 2020-04-15 LAB — BLOOD GAS, ARTERIAL
Acid-Base Excess: 18.5 mmol/L — ABNORMAL HIGH (ref 0.0–2.0)
Acid-Base Excess: 13.7 mmol/L — ABNORMAL HIGH (ref 0.0–2.0)
Acid-Base Excess: 13 mmol/L — ABNORMAL HIGH (ref 0.0–2.0)
Bicarbonate: 43.9 mmol/L — ABNORMAL HIGH (ref 20.0–28.0)
Bicarbonate: 40.6 mmol/L — ABNORMAL HIGH (ref 20.0–28.0)
Bicarbonate: 38.9 mmol/L — ABNORMAL HIGH (ref 20.0–28.0)
Delivery systems: POSITIVE
Expiratory PAP: 8
Expiratory PAP: 5
FIO2: 0.6
FIO2: 30
FIO2: 0.36
Inspiratory PAP: 16
MECHVT: 500 mL
Mode: POSITIVE
Mode: POSITIVE
O2 Saturation: 93.3 %
O2 Saturation: 92.3 %
O2 Saturation: 93.6 %
Patient temperature: 37
Patient temperature: 37
Patient temperature: 37
RATE: 16 resp/min
RATE: 20 resp/min
pCO2 arterial: 55 mmHg — ABNORMAL HIGH (ref 32.0–48.0)
pCO2 arterial: 64 mmHg — ABNORMAL HIGH (ref 32.0–48.0)
pCO2 arterial: 56 mmHg — ABNORMAL HIGH (ref 32.0–48.0)
pH, Arterial: 7.51 — ABNORMAL HIGH (ref 7.350–7.450)
pH, Arterial: 7.41 (ref 7.350–7.450)
pH, Arterial: 7.45 (ref 7.350–7.450)
pO2, Arterial: 61 mmHg — ABNORMAL LOW (ref 83.0–108.0)
pO2, Arterial: 64 mmHg — ABNORMAL LOW (ref 83.0–108.0)
pO2, Arterial: 66 mmHg — ABNORMAL LOW (ref 83.0–108.0)

## 2020-04-16 ENCOUNTER — Telehealth: Payer: Self-pay | Admitting: Cardiovascular Disease

## 2020-04-16 DIAGNOSIS — I5031 Acute diastolic (congestive) heart failure: Secondary | ICD-10-CM

## 2020-04-16 MED ORDER — TORSEMIDE 40 MG PO TABS: 40.0000 mg | ORAL_TABLET | Freq: Every day | ORAL | 0 refills | Status: DC

## 2020-04-16 MED ORDER — POTASSIUM CHLORIDE ER 20 MEQ PO TBCR: 20.0000 meq | EXTENDED_RELEASE_TABLET | Freq: Every day | ORAL | 0 refills | Status: DC

## 2020-04-16 NOTE — Telephone Encounter (Addendum)
Reviewed with Eula Listen, PA.  Per Ryan's instructions:  INCREASE Torsemide to 40 mg daily INCREASE Potassium to 20 mEq daily BMP [facility]. Continue daily weights. Fluid and salt restrictions.  Rx has been sent to the patients  Pharmacy. Lab order placed.  Verbal orders given to Merry Proud, RN with [facility]. Brandi rqst that written orders be faxed to her direct fax at [phone]. Orders faxed via Epic Fax.

## 2020-04-16 NOTE — Telephone Encounter (Signed)
Pt c/o swelling: STAT is pt has developed SOB within 24 hours  1) How much weight have you gained and in what time span? 10 lbs overnight   2) If swelling, where is the swelling located? BLE  3) Are you currently taking a fluid pill? Yes recent changes with Torsemide and k+   4) Are you currently SOB? No   5) Do you have a log of your daily weights (if so, list)? Yes [phone]  6) Have you gained 3 pounds in a day or 5 pounds in a week? Yes   7) Have you traveled recently? No    Per Nurse Merry Proud from facility patient had low potassium and torsemide was changed from 40 BOD to 20 q d   Unable to reach CHF clinic for advise/ orders  Please call

## 2020-04-20 ENCOUNTER — Ambulatory Visit: Payer: Medicare Other | Admitting: Family

## 2020-04-21 ENCOUNTER — Other Ambulatory Visit
Admission: RE | Admit: 2020-04-21 | Discharge: 2020-04-21 | Disposition: A | Discharge status: Home / Self Care | Payer: Medicare Other | Source: Ambulatory Visit | Attending: Physician Assistant | Admitting: Physician Assistant

## 2020-04-21 ENCOUNTER — Other Ambulatory Visit: Payer: Self-pay

## 2020-04-21 DIAGNOSIS — I5031 Acute diastolic (congestive) heart failure: Secondary | ICD-10-CM | POA: Insufficient documentation

## 2020-04-21 LAB — BASIC METABOLIC PANEL
Anion gap: 9 (ref 5–15)
BUN: 17 mg/dL (ref 8–23)
CO2: 34 mmol/L — ABNORMAL HIGH (ref 22–32)
Calcium: 8.8 mg/dL — ABNORMAL LOW (ref 8.9–10.3)
Chloride: 97 mmol/L — ABNORMAL LOW (ref 98–111)
Creatinine, Ser: 1.78 mg/dL — ABNORMAL HIGH (ref 0.61–1.24)
GFR, Estimated: 41 mL/min — ABNORMAL LOW (ref >60)
Glucose, Bld: 254 mg/dL — ABNORMAL HIGH (ref 70–99)
Potassium: 4.2 mmol/L (ref 3.5–5.1)
Sodium: 140 mmol/L (ref 135–145)

## 2020-04-22 ENCOUNTER — Telehealth: Payer: Self-pay | Admitting: *Deleted

## 2020-04-22 DIAGNOSIS — I5031 Acute diastolic (congestive) heart failure: Secondary | ICD-10-CM

## 2020-04-22 DIAGNOSIS — I2510 Atherosclerotic heart disease of native coronary artery without angina pectoris: Secondary | ICD-10-CM

## 2020-04-22 NOTE — Telephone Encounter (Signed)
-----   Message from Sondra Barges, PA-C sent at 04/21/2020 12:00 PM EDT ----- Renal function is mildly elevated but compared to his typical baseline, though is in line with some of his more recent readings. Potassium at goal. Random glucose is significantly elevated and poorly controlled.  Recommendations: -Please have patient hold torsemide and KCl for 1 day -Decrease torsemide to 20 mg daily and KCl to 10 mEq daily -Follow-up with PCP for ongoing diabetic care -Follow-up BMP in 1 week after the above dosage changes

## 2020-04-22 NOTE — Telephone Encounter (Signed)
Left voicemail message to call back regarding results and recommendations.  

## 2020-04-26 ENCOUNTER — Ambulatory Visit: Payer: Medicare Other | Admitting: Family

## 2020-04-27 MED ORDER — TORSEMIDE 20 MG PO TABS: 20.0000 mg | ORAL_TABLET | Freq: Every day | ORAL | 0 refills | Status: AC

## 2020-04-27 MED ORDER — POTASSIUM CHLORIDE ER 20 MEQ PO TBCR: 10.0000 meq | EXTENDED_RELEASE_TABLET | Freq: Every day | ORAL | 0 refills | Status: DC

## 2020-04-27 NOTE — Telephone Encounter (Signed)
Patient is in assisted living at [facility]. Spoke with him and reviewed medication changes. He requested that I review with his caregiver Tobey Grim). Nannie states that I need to fax orders over to them at [phone] and then also fax information over to [facility]. She requested that I call Brandy at [phone] and fax to her as well at [phone]. Patient verbalized understanding of changes and for repeat labs over at the [facility] entrance. Will enter orders, changes in medications, and fax over to numbers requested. No further questions at this time.

## 2020-04-27 NOTE — Telephone Encounter (Signed)
Called back and spoke with [place]. Advised that the fax went through to her but I could not get it to go in at the facility fax number. She stated she would make sure they received the orders as well. No further needs at this time.

## 2020-04-27 NOTE — Telephone Encounter (Signed)
No answer. Left message to call back.   

## 2020-04-27 NOTE — Telephone Encounter (Signed)
Spoke with Higher education careers adviser at facility and reviewed changes. She did have concerns because when these changes were done before he gained 10 pounds and increased swelling. Advised that due to his kidney function that was the cause of these decreases. She states they will try this and if weight gain or swelling they will reach out to [place]. Confirmed upcoming appointments and repeat labs on either 4/27 or 4/28 over at the [facility]. Advised I would fax over orders and to let us know if any further questions.

## 2020-04-28 ENCOUNTER — Ambulatory Visit: Payer: Medicare Other | Admitting: Urology

## 2020-04-29 ENCOUNTER — Encounter: Payer: Self-pay | Admitting: Urology

## 2020-04-29 ENCOUNTER — Ambulatory Visit (INDEPENDENT_AMBULATORY_CARE_PROVIDER_SITE_OTHER): Payer: Medicare Other | Admitting: Urology

## 2020-04-29 ENCOUNTER — Other Ambulatory Visit: Payer: Self-pay

## 2020-04-29 VITALS — BP 95/55 | HR 85 | Ht 68.0 in | Wt 212.0 lb

## 2020-04-29 DIAGNOSIS — N401 Enlarged prostate with lower urinary tract symptoms: Secondary | ICD-10-CM

## 2020-04-29 DIAGNOSIS — N138 Other obstructive and reflux uropathy: Secondary | ICD-10-CM

## 2020-04-29 LAB — BLADDER SCAN AMB NON-IMAGING

## 2020-04-29 MED ORDER — ALFUZOSIN HCL ER 10 MG PO TB24: 10.0000 mg | ORAL_TABLET | Freq: Every day | ORAL | 11 refills | Status: AC

## 2020-04-29 NOTE — Progress Notes (Signed)
04/29/20 11:05 AM   Gregory Hall [name] Dec 29, 1955 [id number]  CC: BPH and urinary symptoms  HPI: I saw Gregory Hall for the above issue today.  He is an extremely comorbid 65 year old male who resides in a living facility with past medical history notable for CKD, CAD, COPD, CHF, chronic pain on narcotics.  He has required straight catheterization twice in the last 5 months for incomplete emptying with PVR greater than 400 mL.  He is currently on doxazosin and Flomax.  He is unable to identify any aggravating or alleviating factors.  He has been on narcotics long-term, and does not think this is related to his urinary symptoms.  He has nocturia once per night.  He denies any incontinence.  Denies history of UTIs.  His primary urinary complaint is intermittency and weak stream during the day.  He is also on high-dose diuretics that are new.  IPSS score is 24, with quality of life unhappy.  PVR is normal today at 130 mL, and he was unable to give a urinalysis.  Urinalysis 04/08/2020 was completely benign with no microscopic hematuria, pyuria, leukocytes, or bacteria.    I reviewed his CT from February 2022 that shows no hydronephrosis, nondistended bladder, constipation, and prostate measuring 30 g.    PMH: Past Medical History:  Diagnosis Date  . AKI (acute kidney injury) (HCC) 09/12/2019  . CAD (coronary artery disease)    s/p PTCA and stent x2  . Chest pain   . CHF (congestive heart failure) (HCC)   . Chronic pain syndrome   . COPD (chronic obstructive pulmonary disease) (HCC)   . Degenerative cervical disc   . Depression   . Diabetes mellitus without complication (HCC)   . Dyslipidemia   . GERD (gastroesophageal reflux disease)   . Hernia 2014  . Hypertension   . MRSA (methicillin resistant staph aureus) culture positive 2011  . Neuropathy   . Nutcracker esophagus   . Rectus diastasis 07/19/2012    Surgical History: Past Surgical History:  Procedure Laterality Date  .  BACK SURGERY  2012  . CARDIOVERSION N/A 09/26/2019   Procedure: CARDIOVERSION;  Surgeon: Antonieta Iba, MD;  Location: [facility] ORS;  Service: Cardiovascular;  Laterality: N/A;  . CHOLECYSTECTOMY    . COLONOSCOPY  Jan 2014   [name]  . CORONARY ANGIOPLASTY WITH STENT PLACEMENT  2009   stents x2, in [place], [place]  . FOOT SURGERY     Right  . NECK SURGERY    . SPINE SURGERY  2012,2013  . TONSILLECTOMY      Family History: Family History  Family history unknown: Yes    Social History:  reports that he has been smoking cigarettes. He has a 50.00 pack-year smoking history. He has never used smokeless tobacco. He reports that he does not drink alcohol and does not use drugs.  Physical Exam: BP (!) 95/55 (BP Location: Right Arm, Patient Position: Sitting, Cuff Size: Large)   Pulse 85   Ht 5\' 8"  (1.727 m)   Wt 212 lb (96.2 kg)   BMI 32.23 kg/m    Constitutional:  Alert and oriented, frail-appearing, in wheelchair Cardiovascular: No clubbing, cyanosis, or edema. Respiratory: Normal respiratory effort, no increased work of breathing. GI: Abdomen is soft, nontender, nondistended, no abdominal masses   Laboratory Data: Reviewed, see HPI  Pertinent Imaging: I have personally viewed and interpreted the CT dated 02/17/2020 that shows no hydronephrosis, nondistended bladder, constipation, and prostate measuring 30 g  Assessment & Plan:  65 year old male with urinary symptoms of intermittency during the day and weak stream.  He has required intermittent catheterization twice at his facility over the last 4 months.  Urinalysis was completely benign 2 weeks ago, and PVR is normal today.  He is currently on both doxazosin and Flomax, and blood pressure is low today at 95/55.  I recommended changing to alfuzosin for more selective alpha blockade in preventing some of his hypotension, and Flomax and doxazosin were discontinued.  Close follow-up in 6 weeks for repeat PVR and symptom check,  consider cystoscopy if persistent symptoms.  With his small prostate, he may be a candidate for UroLift if he continues to require intermittent catheterization he does not have improvement on the alfuzosin.  Flomax and doxazosin discontinued, start alfuzosin 10 mg daily RTC 6 weeks with PVR  I spent 65 total minutes on the day of the encounter including pre-visit review of the medical record, face-to-face time with the patient, and post visit ordering of labs/imaging/tests.   Legrand Rams, MD 04/29/2020  [facility] [address] [place], [place] [address] [phone]

## 2020-04-30 ENCOUNTER — Ambulatory Visit: Payer: Medicare Other | Admitting: Family

## 2020-05-03 ENCOUNTER — Telehealth: Payer: Self-pay | Admitting: Family

## 2020-05-03 NOTE — Telephone Encounter (Signed)
Patient got confused and was under the impression he had an appointment with [place] today but it was with [facility] clinic and was also not until noon. Patient wanted to leave and asked me to call [place] for him and get it rescheduled as well as [facility] medical transportation for pick up. I then called patient once appointment was rescheduled and lvm with him with the new date and time.  Yarrow Linhart, NT

## 2020-05-06 ENCOUNTER — Other Ambulatory Visit: Payer: Self-pay

## 2020-05-06 ENCOUNTER — Other Ambulatory Visit
Admission: RE | Admit: 2020-05-06 | Discharge: 2020-05-06 | Disposition: A | Discharge status: Home / Self Care | Payer: Medicare Other | Attending: Physician Assistant | Admitting: Physician Assistant

## 2020-05-06 DIAGNOSIS — I5031 Acute diastolic (congestive) heart failure: Secondary | ICD-10-CM

## 2020-05-06 DIAGNOSIS — I2510 Atherosclerotic heart disease of native coronary artery without angina pectoris: Secondary | ICD-10-CM | POA: Insufficient documentation

## 2020-05-06 LAB — BASIC METABOLIC PANEL
Anion gap: 12 (ref 5–15)
BUN: 19 mg/dL (ref 8–23)
CO2: 35 mmol/L — ABNORMAL HIGH (ref 22–32)
Calcium: 8.4 mg/dL — ABNORMAL LOW (ref 8.9–10.3)
Chloride: 92 mmol/L — ABNORMAL LOW (ref 98–111)
Creatinine, Ser: 1.44 mg/dL — ABNORMAL HIGH (ref 0.61–1.24)
GFR, Estimated: 53 mL/min — ABNORMAL LOW (ref >60)
Glucose, Bld: 183 mg/dL — ABNORMAL HIGH (ref 70–99)
Potassium: 3.8 mmol/L (ref 3.5–5.1)
Sodium: 139 mmol/L (ref 135–145)

## 2020-05-12 ENCOUNTER — Encounter: Payer: Self-pay | Admitting: Emergency Medicine

## 2020-05-12 ENCOUNTER — Telehealth: Payer: Self-pay | Admitting: Urology

## 2020-05-12 ENCOUNTER — Other Ambulatory Visit: Payer: Self-pay

## 2020-05-12 ENCOUNTER — Inpatient Hospital Stay
Admission: EM | Admit: 2020-05-12 | Discharge: 2020-05-18 | DRG: 917 | Disposition: A | Discharge status: Skilled Nursing Facility | Payer: Medicare Other | Attending: Internal Medicine | Admitting: Internal Medicine

## 2020-05-12 ENCOUNTER — Emergency Department: Payer: Medicare Other

## 2020-05-12 DIAGNOSIS — J9602 Acute respiratory failure with hypercapnia: Secondary | ICD-10-CM

## 2020-05-12 DIAGNOSIS — G928 Other toxic encephalopathy: Secondary | ICD-10-CM | POA: Diagnosis present

## 2020-05-12 DIAGNOSIS — J441 Chronic obstructive pulmonary disease with (acute) exacerbation: Secondary | ICD-10-CM | POA: Diagnosis present

## 2020-05-12 DIAGNOSIS — R0602 Shortness of breath: Secondary | ICD-10-CM

## 2020-05-12 DIAGNOSIS — IMO0002 Reserved for concepts with insufficient information to code with codable children: Secondary | ICD-10-CM

## 2020-05-12 DIAGNOSIS — J9621 Acute and chronic respiratory failure with hypoxia: Secondary | ICD-10-CM | POA: Diagnosis present

## 2020-05-12 DIAGNOSIS — K219 Gastro-esophageal reflux disease without esophagitis: Secondary | ICD-10-CM | POA: Diagnosis present

## 2020-05-12 DIAGNOSIS — J9692 Respiratory failure, unspecified with hypercapnia: Secondary | ICD-10-CM | POA: Diagnosis present

## 2020-05-12 DIAGNOSIS — Z20822 Contact with and (suspected) exposure to covid-19: Secondary | ICD-10-CM | POA: Diagnosis present

## 2020-05-12 DIAGNOSIS — Z884 Allergy status to anesthetic agent status: Secondary | ICD-10-CM

## 2020-05-12 DIAGNOSIS — R0600 Dyspnea, unspecified: Secondary | ICD-10-CM

## 2020-05-12 DIAGNOSIS — T423X1A Poisoning by barbiturates, accidental (unintentional), initial encounter: Principal | ICD-10-CM | POA: Diagnosis present

## 2020-05-12 DIAGNOSIS — Z7951 Long term (current) use of inhaled steroids: Secondary | ICD-10-CM

## 2020-05-12 DIAGNOSIS — G894 Chronic pain syndrome: Secondary | ICD-10-CM | POA: Diagnosis present

## 2020-05-12 DIAGNOSIS — G929 Unspecified toxic encephalopathy: Secondary | ICD-10-CM | POA: Diagnosis present

## 2020-05-12 DIAGNOSIS — Z79899 Other long term (current) drug therapy: Secondary | ICD-10-CM

## 2020-05-12 DIAGNOSIS — J9622 Acute and chronic respiratory failure with hypercapnia: Secondary | ICD-10-CM | POA: Diagnosis present

## 2020-05-12 DIAGNOSIS — E1165 Type 2 diabetes mellitus with hyperglycemia: Secondary | ICD-10-CM

## 2020-05-12 DIAGNOSIS — Z886 Allergy status to analgesic agent status: Secondary | ICD-10-CM

## 2020-05-12 DIAGNOSIS — Z955 Presence of coronary angioplasty implant and graft: Secondary | ICD-10-CM

## 2020-05-12 DIAGNOSIS — E785 Hyperlipidemia, unspecified: Secondary | ICD-10-CM | POA: Diagnosis present

## 2020-05-12 DIAGNOSIS — J189 Pneumonia, unspecified organism: Secondary | ICD-10-CM | POA: Diagnosis present

## 2020-05-12 DIAGNOSIS — Z7984 Long term (current) use of oral hypoglycemic drugs: Secondary | ICD-10-CM

## 2020-05-12 DIAGNOSIS — Z7901 Long term (current) use of anticoagulants: Secondary | ICD-10-CM

## 2020-05-12 DIAGNOSIS — Z794 Long term (current) use of insulin: Secondary | ICD-10-CM

## 2020-05-12 DIAGNOSIS — I2510 Atherosclerotic heart disease of native coronary artery without angina pectoris: Secondary | ICD-10-CM | POA: Diagnosis present

## 2020-05-12 DIAGNOSIS — I5032 Chronic diastolic (congestive) heart failure: Secondary | ICD-10-CM | POA: Diagnosis present

## 2020-05-12 DIAGNOSIS — E876 Hypokalemia: Secondary | ICD-10-CM | POA: Diagnosis present

## 2020-05-12 DIAGNOSIS — E1140 Type 2 diabetes mellitus with diabetic neuropathy, unspecified: Secondary | ICD-10-CM | POA: Diagnosis present

## 2020-05-12 DIAGNOSIS — I110 Hypertensive heart disease with heart failure: Secondary | ICD-10-CM | POA: Diagnosis present

## 2020-05-12 DIAGNOSIS — J440 Chronic obstructive pulmonary disease with acute lower respiratory infection: Secondary | ICD-10-CM | POA: Diagnosis present

## 2020-05-12 DIAGNOSIS — F17210 Nicotine dependence, cigarettes, uncomplicated: Secondary | ICD-10-CM | POA: Diagnosis present

## 2020-05-12 DIAGNOSIS — Z4659 Encounter for fitting and adjustment of other gastrointestinal appliance and device: Secondary | ICD-10-CM

## 2020-05-12 LAB — CBC
HCT: 37.6 % — ABNORMAL LOW (ref 39.0–52.0)
Hemoglobin: 11.1 g/dL — ABNORMAL LOW (ref 13.0–17.0)
MCH: 25.4 pg — ABNORMAL LOW (ref 26.0–34.0)
MCHC: 29.5 g/dL — ABNORMAL LOW (ref 30.0–36.0)
MCV: 86 fL (ref 80.0–100.0)
Platelets: 233 10*3/uL (ref 150–400)
RBC: 4.37 MIL/uL (ref 4.22–5.81)
RDW: 23.1 % — ABNORMAL HIGH (ref 11.5–15.5)
WBC: 7.3 10*3/uL (ref 4.0–10.5)
nRBC: 0 % (ref 0.0–0.2)

## 2020-05-12 LAB — BASIC METABOLIC PANEL
Anion gap: 11 (ref 5–15)
BUN: 20 mg/dL (ref 8–23)
CO2: 36 mmol/L — ABNORMAL HIGH (ref 22–32)
Calcium: 8.5 mg/dL — ABNORMAL LOW (ref 8.9–10.3)
Chloride: 91 mmol/L — ABNORMAL LOW (ref 98–111)
Creatinine, Ser: 1.3 mg/dL — ABNORMAL HIGH (ref 0.61–1.24)
GFR, Estimated: >60 mL/min (ref >60)
Glucose, Bld: 92 mg/dL (ref 70–99)
Potassium: 3.9 mmol/L (ref 3.5–5.1)
Sodium: 138 mmol/L (ref 135–145)

## 2020-05-12 LAB — RESP PANEL BY RT-PCR (FLU A&B, COVID) ARPGX2
Influenza A by PCR: NEGATIVE
Influenza B by PCR: NEGATIVE
SARS Coronavirus 2 by RT PCR: NEGATIVE

## 2020-05-12 LAB — BLOOD GAS, VENOUS
Acid-Base Excess: 15.1 mmol/L — ABNORMAL HIGH (ref 0.0–2.0)
Bicarbonate: 45.9 mmol/L — ABNORMAL HIGH (ref 20.0–28.0)
FIO2: 70
Mode: POSITIVE
O2 Saturation: 96.6 %
Patient temperature: 37
pCO2, Ven: 100 mmHg (ref 44.0–60.0)
pH, Ven: 7.27 (ref 7.250–7.430)
pO2, Ven: 97 mmHg — ABNORMAL HIGH (ref 32.0–45.0)

## 2020-05-12 LAB — BLOOD GAS, ARTERIAL
Acid-Base Excess: 13.6 mmol/L — ABNORMAL HIGH (ref 0.0–2.0)
Bicarbonate: 44.4 mmol/L — ABNORMAL HIGH (ref 20.0–28.0)
Expiratory PAP: 10
FIO2: 40
Inspiratory PAP: 18
Mechanical Rate: 20
Mode: POSITIVE
O2 Saturation: 91.7 %
Patient temperature: 37
pCO2 arterial: 99 mmHg (ref 32.0–48.0)
pH, Arterial: 7.26 — ABNORMAL LOW (ref 7.350–7.450)
pO2, Arterial: 72 mmHg — ABNORMAL LOW (ref 83.0–108.0)

## 2020-05-12 LAB — PROTIME-INR
INR: 1 (ref 0.8–1.2)
Prothrombin Time: 12.7 seconds (ref 11.4–15.2)

## 2020-05-12 LAB — TROPONIN I (HIGH SENSITIVITY): Troponin I (High Sensitivity): 6 ng/L (ref <18)

## 2020-05-12 MED ORDER — METHYLPREDNISOLONE SODIUM SUCC 125 MG IJ SOLR
125.0000 mg | Freq: Once | INTRAMUSCULAR | Status: AC
  Administered 2020-05-13: 125 mg via INTRAVENOUS
  Filled 2020-05-12: qty 2

## 2020-05-12 MED ORDER — NALOXONE HCL 2 MG/2ML IJ SOSY
0.4000 mg | PREFILLED_SYRINGE | Freq: Once | INTRAMUSCULAR | Status: AC
  Administered 2020-05-12: 0.4 mg via INTRAVENOUS
  Filled 2020-05-12: qty 2

## 2020-05-12 MED ORDER — ROCURONIUM BROMIDE 50 MG/5ML IV SOLN
INTRAVENOUS | Status: AC | PRN
  Administered 2020-05-12: 100 mg via INTRAVENOUS

## 2020-05-12 MED ORDER — DEXMEDETOMIDINE HCL IN NACL 400 MCG/100ML IV SOLN
0.4000 ug/kg/h | INTRAVENOUS | Status: DC
  Administered 2020-05-12: 0.4 ug/kg/h via INTRAVENOUS
  Filled 2020-05-12: qty 100

## 2020-05-12 MED ORDER — IPRATROPIUM-ALBUTEROL 0.5-2.5 (3) MG/3ML IN SOLN
3.0000 mL | Freq: Once | RESPIRATORY_TRACT | Status: AC
  Administered 2020-05-12: 3 mL via RESPIRATORY_TRACT
  Filled 2020-05-12: qty 3

## 2020-05-12 MED ORDER — ETOMIDATE 2 MG/ML IV SOLN
INTRAVENOUS | Status: AC | PRN
  Administered 2020-05-12: 20 mg via INTRAVENOUS

## 2020-05-12 NOTE — ED Notes (Signed)
Assumed care of pt at 2300. Pt resting comfortably with BiPAP mask in place, O2 sat 91%. Respiratory at bedside

## 2020-05-12 NOTE — Code Documentation (Signed)
Md Roxan Hockey, RT at bedside,and RN Raquel at bedside. Pt preoxygenating at this time.

## 2020-05-12 NOTE — ED Provider Notes (Signed)
[facility] Emergency Department Provider Note    Event Date/Time   First MD Initiated Contact with Patient 05/12/20 2152     (approximate)  I have reviewed the triage vital signs and the nursing notes.   HISTORY  Chief Complaint Shortness of Breath  Level V caveat:  AMS - resp failure  HPI Gregory Hall is a 65 y.o. male extensive past medical history as listed below frequent visits to the hospital for hypoxic and hypercapnic respiratory failure and polysubstance abuse presents to the ER initially EMS was called out to facility earlier today for shortness of breath and increased transport.  Be called out today as the patient become increasingly drowsy and found to be hypoxic in the 70s.  Was placed on nonrebreather and brought to the ER.  Patient drowsy unable provide much additional history.  On review of previous visits had similar presentation was given Narcan was put on BiPAP.    Past Medical History:  Diagnosis Date  . AKI (acute kidney injury) (HCC) 09/12/2019  . CAD (coronary artery disease)    s/p PTCA and stent x2  . Chest pain   . CHF (congestive heart failure) (HCC)   . Chronic pain syndrome   . COPD (chronic obstructive pulmonary disease) (HCC)   . Degenerative cervical disc   . Depression   . Diabetes mellitus without complication (HCC)   . Dyslipidemia   . GERD (gastroesophageal reflux disease)   . Hernia 2014  . Hypertension   . MRSA (methicillin resistant staph aureus) culture positive 2011  . Neuropathy   . Nutcracker esophagus   . Rectus diastasis 07/19/2012   Family History  Family history unknown: Yes   Past Surgical History:  Procedure Laterality Date  . BACK SURGERY  2012  . CARDIOVERSION N/A 09/26/2019   Procedure: CARDIOVERSION;  Surgeon: Antonieta Iba, MD;  Location: [facility] ORS;  Service: Cardiovascular;  Laterality: N/A;  . CHOLECYSTECTOMY    . COLONOSCOPY  Jan 2014   [name]  . CORONARY ANGIOPLASTY WITH  STENT PLACEMENT  2009   stents x2, in [place], [place]  . FOOT SURGERY     Right  . NECK SURGERY    . SPINE SURGERY  2012,2013  . TONSILLECTOMY     Patient Active Problem List   Diagnosis Date Noted  . Altered mental status 04/08/2020  . AMS (altered mental status) 04/08/2020  . Prolonged QT interval 04/08/2020  . Acute metabolic encephalopathy 04/08/2020  . Continuous tobacco abuse 03/20/2020  . AKI (acute kidney injury) (HCC) 02/17/2020  . Diabetes mellitus without complication (HCC)   . Diastolic dysfunction with chronic heart failure (HCC)   . Near syncope   . Acute respiratory failure (HCC) 12/13/2019  . Palliative care by specialist   . DNR (do not resuscitate)   . Acute CHF (congestive heart failure) (HCC) 10/08/2019  . Chronic respiratory failure with hypoxia (HCC) 10/08/2019  . Atrial flutter (HCC)   . Acute on chronic respiratory failure with hypoxia (HCC) 09/13/2019  . Acute diastolic CHF (congestive heart failure) (HCC) 09/12/2019  . NSTEMI (non-ST elevated myocardial infarction) (HCC) 09/12/2019  . Acute kidney failure, unspecified (HCC) 09/12/2019  . Diabetes mellitus type 2, uncontrolled, with complications (HCC) 06/10/2019  . Chronic pain syndrome 06/10/2019  . COPD exacerbation (HCC) 06/10/2019  . COPD with acute exacerbation (HCC) 06/09/2019  . CAD (coronary artery disease) 06/09/2019  . HTN (hypertension) 06/09/2019  . Acute on chronic respiratory failure with hypoxia and hypercapnia (HCC)  06/09/2019  . Anxiety 06/09/2019  . Chronic prescription opiate use 06/09/2019  . Hyperglycemia 06/09/2019  . Subclavian artery stenosis, left (HCC) 08/20/2014  . Rectus diastasis 07/19/2012  . Hernia   . Edema 05/10/2011  . Goals of care, counseling/discussion 10/06/2010  . SMOKER 11/25/2009  . CAROTID BRUIT, RIGHT 11/24/2009  . Chest pain 11/24/2009  . Hyperlipidemia 03/30/2009  . Coronary atherosclerosis 03/30/2009  . HYPERTENSION, HX OF 03/30/2009      Prior  to Admission medications   Medication Sig Start Date End Date Taking? Authorizing Provider  alfuzosin (UROXATRAL) 10 MG 24 hr tablet Take 1 tablet (10 mg total) by mouth daily with breakfast. 04/29/20   Sondra Come, MD  amiodarone (PACERONE) 200 MG tablet Take 1 tablet (200 mg total) by mouth daily. 11/03/19   Marisue Ivan D, PA-C  atorvastatin (LIPITOR) 40 MG tablet Take 1 tablet (40 mg total) by mouth daily. 09/28/19   Lewie Chamber, MD  bisacodyl (DULCOLAX) 5 MG EC tablet Take by mouth.    [provider]  budesonide (PULMICORT) 0.5 MG/2ML nebulizer solution Take 0.5 mg by nebulization 2 (two) times daily.    [provider]  butalbital-acetaminophen-caffeine (FIORICET) [phone] MG tablet Take by mouth. 04/02/20   [provider]  Cholecalciferol (VITAMIN D3) 50 MCG (2000 UT) TABS Take 2,000 Units by mouth daily.     [provider]  doxycycline (VIBRAMYCIN) 100 MG capsule Take 100 mg by mouth 2 (two) times daily. 04/21/20   [provider]  DULoxetine (CYMBALTA) 60 MG capsule Take 60 mg by mouth daily.    [provider]  Ensure Max Protein (ENSURE MAX PROTEIN) LIQD Take 330 mLs (11 oz total) by mouth 2 (two) times daily between meals. 06/14/19   Sheikh, Omair Latif, DO  fexofenadine (ALLEGRA) 180 MG tablet Take 180 mg by mouth daily. 01/23/20   [provider]  fluticasone (FLONASE) 50 MCG/ACT nasal spray Place 1 spray into both nostrils 2 (two) times daily. 12/15/19   Pennie Banter, DO  Fluticasone-Umeclidin-Vilant (TRELEGY ELLIPTA) 100-62.5-25 MCG/INH AEPB Inhale 1 puff into the lungs daily.  10/20/19   [provider]  Fremanezumab-vfrm 225 MG/1.5ML SOAJ Inject into the skin. 04/02/20   [provider]  Galcanezumab-gnlm (EMGALITY, 300 MG DOSE,) 100 MG/ML SOSY Inject into the skin. 02/20/20   [provider]  glyBURIDE (DIABETA) 5 MG tablet Take by mouth at bedtime. 04/17/20   [provider]  glycopyrrolate (ROBINUL) 1 MG tablet Take 1 tablet (1 mg total) by mouth 3 (three) times daily. 02/21/20   Arnetha Courser, MD  guaiFENesin (MUCINEX) 600 MG 12 hr tablet Take 1 tablet (600 mg total) by mouth 2 (two) times daily. 02/21/20   Arnetha Courser, MD  insulin lispro (HUMALOG) 100 UNIT/ML injection Inject 4 Units into the skin See admin instructions. Inject 4 units with blood sugar 150-250; inject 6 units 251-350; greater than 351 call provider; Hold for FSBG <150    [provider]  ipratropium-albuterol (DUONEB) 0.5-2.5 (3) MG/3ML SOLN Take 3 mLs by nebulization every 6 (six) hours as needed. Patient taking differently: Take 3 mLs by nebulization every 6 (six) hours as needed (wheezing). 06/14/19   Marguerita Merles Latif, DO  lactulose (CHRONULAC) 10 GM/15ML solution Take 30 mLs by mouth daily as needed. 03/31/20   [provider]  lamoTRIgine (LAMICTAL) 100 MG tablet Take 100 mg by mouth daily.    [provider]  LANTUS SOLOSTAR 100 UNIT/ML Solostar Pen Inject  into the skin. 03/18/20   [provider]  magnesium oxide (MAG-OX) 400 (241.3 Mg) MG tablet Take 1 tablet by mouth daily. 12/24/19   [provider]  Menthol-Methyl Salicylate (SALONPAS PAIN RELIEF PATCH EX) Apply 1 patch topically every 12 (twelve) hours as needed.    [provider]  midodrine (PROAMATINE) 10 MG tablet Take 1 tablet (10 mg total) by mouth 2 (two) times daily. Take if top blood pressure reading is less than 100 02/16/20   Dunn, Ryan M, PA-C  NAC 600 MG CAPS Take 600 mg by mouth daily. 09/08/19   [provider]  nicotine (NICODERM CQ - DOSED IN MG/24 HOURS) 21 mg/24hr patch Place 21 mg onto the skin daily. 03/11/20   [provider]  NITROSTAT 0.4 MG SL tablet DISSOLVE (1) TABLET UNDER TONGUE AS NEEDED TO RELIEVE CHEST PAIN. MAYREPEAT EVERY 5 MINUTES. Patient taking differently: Place 0.4 mg under the tongue every 5 (five) minutes as needed for chest pain.  10/06/15   Iran Ouch, MD  ondansetron (ZOFRAN-ODT) 4 MG disintegrating tablet Take 1 tablet (4 mg total) by mouth every 8 (eight) hours as needed for nausea or vomiting. 02/21/20   Arnetha Courser, MD  Oxycodone HCl 10 MG TABS Take 1 tablet (10 mg total) by mouth 3 (three) times daily. 04/09/20   Leatha Gilding, MD  oxymetazoline (AFRIN) 0.05 % nasal spray Place 2 sprays into both nostrils 2 (two) times daily as needed for congestion.    [provider]  pantoprazole (PROTONIX) 40 MG tablet Take 40 mg by mouth daily.     [provider]  Potassium Chloride ER 20 MEQ TBCR Take 10 mEq by mouth daily. 04/27/20   Dunn, Raymon Mutton, PA-C  potassium chloride SA (KLOR-CON) 20 MEQ tablet Take 20 mEq by mouth daily. 04/17/20   [provider]  potassium citrate (UROCIT-K) 10 MEQ (1080 MG) SR tablet Take 10 mEq by mouth daily. 04/28/20   [provider]  predniSONE (DELTASONE) 2.5 MG tablet Take 2.5 mg by mouth daily. 04/17/20   [provider]  pregabalin (LYRICA) 200 MG capsule Take 1 capsule (200 mg total) by mouth 3 (three) times daily. 09/14/19   Danford, Earl Lites, MD  rivaroxaban (XARELTO) 20 MG TABS tablet Take 20 mg by mouth daily with supper.    [provider]  roflumilast (DALIRESP) 500 MCG TABS tablet Take 0.5 tablets (250 mcg total) by mouth daily. 02/21/20   Arnetha Courser, MD  senna-docusate (SENOKOT-S) 8.6-50 MG tablet Take 1 tablet by mouth daily.     [provider]  sodium chloride (OCEAN) 0.65 % SOLN nasal spray Place 1 spray into both nostrils as needed for congestion. 12/15/19   Pennie Banter, DO  sulfamethoxazole-trimethoprim (BACTRIM) 400-80 MG tablet Take 1 tablet by mouth daily. 04/22/20   [provider]  torsemide (DEMADEX) 20 MG tablet Take 1 tablet (20 mg total) by mouth daily. 04/27/20 07/26/20  Sondra Barges, PA-C  traZODone (DESYREL) 150 MG tablet Take 150 mg by mouth at bedtime. 01/23/20   [provider]  Ubrogepant (UBRELVY) 100 MG TABS Take 100 mg by mouth daily. 02/22/20   Arnetha Courser, MD  varenicline (CHANTIX) 1 MG tablet Take 1 mg by mouth 2 (two) times daily.    [provider]    Allergies Acetaminophen, Fentanyl, and Nsaids    Social History Social History   Tobacco Use  . Smoking status: Current Every Day Smoker  Packs/day: 1.00    Years: 50.00    Pack years: 50.00    Types: Cigarettes    Last attempt to quit: 11/26/2019    Years since quitting: 0.4  . Smokeless tobacco: Never Used  Substance Use Topics  . Alcohol use: No  . Drug use: No    Review of Systems Patient denies headaches, rhinorrhea, blurry vision, numbness, shortness of breath, chest pain, edema, cough, abdominal pain, nausea, vomiting, diarrhea, dysuria, fevers, rashes or hallucinations unless otherwise stated above in HPI. ____________________________________________   PHYSICAL EXAM:  VITAL SIGNS: Vitals:   05/12/20 2300 05/12/20 2328  BP: 111/64   Pulse: 73 69  Resp: 11 (!) 40  Temp:    SpO2: 92% (!) 87%    Constitutional: Drowsy,chronicallyi ill-appearing Eyes: Conjunctivae are normal.  Head: Atraumatic. Nose: No congestion/rhinnorhea. Mouth/Throat: Mucous membranes are moist.   Neck: No stridor. Painless ROM.  Cardiovascular: Normal rate, regular rhythm. Grossly normal heart sounds.  Good peripheral circulation. Respiratory: No acute tachypnea on nonrebreather.  He is hypoxic requiring supplemental oxygen.  Placed on BiPAP.  Diminished breath sounds throughout Gastrointestinal: Soft and nontender. No distention. No abdominal bruits. No CVA tenderness. Genitourinary:  Musculoskeletal: No lower extremity tenderness nor edema.  No joint effusions. Neurologic:  Normal speech and language. No gross focal neurologic deficits are appreciated. No facial droop Skin:  Skin is warm, dry and intact. No rash noted. Psychiatric: Mood and affect are normal. Speech and behavior are  normal.  ____________________________________________   LABS (all labs ordered are listed, but only abnormal results are displayed)  Results for orders placed or performed during the hospital encounter of 05/12/20 (from the past 24 hour(s))  Basic metabolic panel     Status: Abnormal   Collection Time: 05/12/20 10:00 PM  Result Value Ref Range   Sodium 138 135 - 145 mmol/L   Potassium 3.9 3.5 - 5.1 mmol/L   Chloride 91 (L) 98 - 111 mmol/L   CO2 36 (H) 22 - 32 mmol/L   Glucose, Bld 92 70 - 99 mg/dL   BUN 20 8 - 23 mg/dL   Creatinine, Ser 4.01 (H) 0.61 - 1.24 mg/dL   Calcium 8.5 (L) 8.9 - 10.3 mg/dL   GFR, Estimated >02 >72 mL/min   Anion gap 11 5 - 15  CBC     Status: Abnormal   Collection Time: 05/12/20 10:00 PM  Result Value Ref Range   WBC 7.3 4.0 - 10.5 K/uL   RBC 4.37 4.22 - 5.81 MIL/uL   Hemoglobin 11.1 (L) 13.0 - 17.0 g/dL   HCT 53.6 (L) 64.4 - 03.4 %   MCV 86.0 80.0 - 100.0 fL   MCH 25.4 (L) 26.0 - 34.0 pg   MCHC 29.5 (L) 30.0 - 36.0 g/dL   RDW 74.2 (H) 59.5 - 63.8 %   Platelets 233 150 - 400 K/uL   nRBC 0.0 0.0 - 0.2 %  Troponin I (High Sensitivity)     Status: None   Collection Time: 05/12/20 10:00 PM  Result Value Ref Range   Troponin I (High Sensitivity) 6 <18 ng/L  Protime-INR (order if Patient is taking Coumadin / Warfarin)     Status: None   Collection Time: 05/12/20 10:00 PM  Result Value Ref Range   Prothrombin Time 12.7 11.4 - 15.2 seconds   INR 1.0 0.8 - 1.2  Blood gas, venous     Status: Abnormal   Collection Time: 05/12/20 10:18 PM  Result Value Ref Range  FIO2 70.00    Mode BILEVEL POSITIVE AIRWAY PRESSURE    pH, Ven 7.27 7.250 - 7.430   pCO2, Ven 100 (HH) 44.0 - 60.0 mmHg   pO2, Ven 97.0 (H) 32.0 - 45.0 mmHg   Bicarbonate 45.9 (H) 20.0 - 28.0 mmol/L   Acid-Base Excess 15.1 (H) 0.0 - 2.0 mmol/L   O2 Saturation 96.6 %   Patient temperature 37.0    Collection site LINE    Sample type VENOUS   Blood gas, arterial     Status: Abnormal    Collection Time: 05/12/20 11:06 PM  Result Value Ref Range   FIO2 40.00    Mode BILEVEL POSITIVE AIRWAY PRESSURE    Inspiratory PAP 18    Expiratory PAP 10    pH, Arterial 7.26 (L) 7.350 - 7.450   pCO2 arterial 99 (HH) 32.0 - 48.0 mmHg   pO2, Arterial 72 (L) 83.0 - 108.0 mmHg   Bicarbonate 44.4 (H) 20.0 - 28.0 mmol/L   Acid-Base Excess 13.6 (H) 0.0 - 2.0 mmol/L   O2 Saturation 91.7 %   Patient temperature 37.0    Collection site RIGHT RADIAL    Sample type ARTERIAL DRAW    Allens test (pass/fail) PASS PASS   Mechanical Rate 20    ____________________________________________  EKG My review and personal interpretation at Time: 21:56   Indication: resp failure  Rate: 75  Rhythm: sinus Axis: normal Other: normal intervals, nonspecific st abn ____________________________________________  RADIOLOGY  I personally reviewed all radiographic images ordered to evaluate for the above acute complaints and reviewed radiology reports and findings.  These findings were personally discussed with the patient.  Please see medical record for radiology report.  ____________________________________________   PROCEDURES  Procedure(s) performed:  .Critical Care Performed by: Willy Eddy, MD Authorized by: Willy Eddy, MD   Critical care provider statement:    Critical care time (minutes):  35   Critical care time was exclusive of:  Separately billable procedures and treating other patients   Critical care was necessary to treat or prevent imminent or life-threatening deterioration of the following conditions:  Respiratory failure   Critical care was time spent personally by me on the following activities:  Development of treatment plan with patient or surrogate, discussions with consultants, evaluation of patient's response to treatment, examination of patient, obtaining history from patient or surrogate, ordering and performing treatments and interventions, ordering and review of  laboratory studies, ordering and review of radiographic studies, pulse oximetry, re-evaluation of patient's condition and review of old charts Procedure Name: Intubation Date/Time: 05/12/2020 11:47 PM Performed by: Willy Eddy, MD Pre-anesthesia Checklist: Patient identified, Patient being monitored, Emergency Drugs available, Timeout performed and Suction available Oxygen Delivery Method: Non-rebreather mask Preoxygenation: Pre-oxygenation with 100% oxygen Induction Type: Rapid sequence Ventilation: Mask ventilation without difficulty Laryngoscope Size: Glidescope and 3 Grade View: Grade I Tube size: 7.5 mm Number of attempts: 1 Airway Equipment and Method: Video-laryngoscopy Placement Confirmation: ETT inserted through vocal cords under direct vision,  CO2 detector and Breath sounds checked- equal and bilateral         Critical Care performed: yes ____________________________________________   INITIAL IMPRESSION / ASSESSMENT AND PLAN / ED COURSE  Pertinent labs & imaging results that were available during my care of the patient were reviewed by me and considered in my medical decision making (see chart for details).   DDX: Asthma, copd, CHF, pna, ptx, malignancy, Pe, anemia  Gregory Hall is a 65 y.o. who presents to the ED  with presentation as described above.  Patient ill-appearing.  Suspect hypoxic and hypercapnic respiratory failure likely polypharmacy.  Will trial Narcan.  Patient placed on BiPAP.  Will try to see if he responds.  Clinical Course as of 05/12/20 2349  Wed May 12, 2020  2259 No significant change after Narcan.  Patient with DNR previously documented.  Patient drowsy but is following commands.  Try to reach family.  Unable to reach contacts listed.  Patient shaking his head no when asked if he be agreeable to intubation.  Shakes his head yes when asked if he understands that this could result in his death.  Respiratory familiar with patient states  that BiPAP has worked in the past despite his significant hyper Mia.  Will repeat VBG.  His O2 sats are okay.  He is protecting his airway at this time.  He is pulling good tidal volumes on vent. [PR]  2322 No improvement in VBG.  Patient persistently drowsy.  On review of records most recent documentation was that he was full code.  Given his respiratory failure and I am not able to clarify goals of care will intubate given no for improvement with BiPAP.  Case discussed with intensivist. [PR]    Clinical Course User Index [PR] Willy Eddy, MD    The patient was evaluated in Emergency Department today for the symptoms described in the history of present illness. He/she was evaluated in the context of the global COVID-19 pandemic, which necessitated consideration that the patient might be at risk for infection with the SARS-CoV-2 virus that causes COVID-19. Institutional protocols and algorithms that pertain to the evaluation of patients at risk for COVID-19 are in a state of rapid change based on information released by regulatory bodies including the CDC and federal and state organizations. These policies and algorithms were followed during the patient's care in the ED.  As part of my medical decision making, I reviewed the following data within the electronic MEDICAL RECORD NUMBER Nursing notes reviewed and incorporated, Labs reviewed, notes from prior ED visits and [place] Controlled Substance Database   ____________________________________________   FINAL CLINICAL IMPRESSION(S) / ED DIAGNOSES  Final diagnoses:  Acute respiratory failure with hypercapnia (HCC)      NEW MEDICATIONS STARTED DURING THIS VISIT:  New Prescriptions   No medications on file     Note:  This document was prepared using Dragon voice recognition software and may include unintentional dictation errors.    Willy Eddy, MD 05/12/20 [id number]

## 2020-05-12 NOTE — H&P (Incomplete)
NAME:  Gregory Hall, MRN:  [id number], DOB:  August 12, 1955, LOS: 0 ADMISSION DATE:  05/12/2020, CONSULTATION DATE:  05/12/20 REFERRING MD:  Willy Eddy, MD CHIEF COMPLAINT:  Altered Mental Status  History of Present Illness:  Gregory Hall is a 65 year old male with CAD, COPD, HFpEF, and DMII who presents from [facility] facility with SpO2 of 70% on room air. He was treated with duoneb by EMS and placed on non-rebreather mask with improvement in his SpO2. He was placed on Bipap upon arrival to the ER. He developed progressive mental status changes and an ABG showed pH 7.26, pCO2 99 and pO2 72. He was intubated in the ER.   PCCM has been called for admission due to acute on chronic hypercapnic respiratory failure.   Patient's family not answering their phones and unable to obtain further history from the patient.   Pertinent  Medical History  Coronary Artery Disease COPD Congestive Heart Failure Diabetes Mellitus Type II  Significant Hospital Events: Including procedures, antibiotic start and stop dates in addition to other pertinent events   . 5/4 Intubated in the ER due to hypercapnic respiratory failure  Interim History / Subjective:  n/a  Objective   Blood pressure 111/64, pulse 69, temperature 98.3 F (36.8 C), temperature source Oral, resp. rate (!) 40, height 5\' 8"  (1.727 m), weight 96 kg, SpO2 (!) 87 %.       No intake or output data in the 24 hours ending 05/12/20 2344 Filed Weights   05/12/20 2157  Weight: 96 kg    Examination: General: *** HENT: *** Lungs: *** Cardiovascular: *** Abdomen: *** Extremities: *** Neuro: *** GU: ***  Labs/imaging that I have personally reviewed  (right click and "Reselect all SmartList Selections" daily)  ABG 5/4 at 2306 5/4 CMP, troponin, CBC, INR,  5/4 CXR - ET tube in place, OG tube in stomach, bibasilar atelectasis  Resolved Hospital Problem list     Assessment & Plan:   Acute Hypercapnic and Hypoxemic  Respiratory Failure Hx of COPD - Continue mechanical ventilatory support - Budesonide + Brovana + Yupelri Nebs - Prednisone + azithromycin?  Altered Mental Status Secondary to hypercapnia but also concern for polypharmacy - Avoid sedating or altering medications - Check Urine tox screen  Diabetes Mellitus Type II - sliding scale insulin  Coronary Artery Disease - continue atorvastatin  Best practice (right click and "Reselect all SmartList Selections" daily)  Diet:  7/4 Pain/Anxiety/Delirium protocol (if indicated): {Pain/Anxiety/Delirium:26941} VAP protocol (if indicated): {VAP:29640} DVT prophylaxis: {DVT Prophylaxis:26933} GI prophylaxis: {GI:26934} Glucose control:  {Glucose Control:26935} Central venous access:  {Central Venous Access:26936} Arterial line:  {Central Venous Access:26936} Foley:  {Central Venous Access:26936} Mobility:  {Mobility:26937}  PT consulted: {PT Consult:26938} Last date of multidisciplinary goals of care discussion [***] Code Status:  {Code Status:26939} Disposition: ***  Labs   CBC: Recent Labs  Lab 05/12/20 2200  WBC 7.3  HGB 11.1*  HCT 37.6*  MCV 86.0  PLT 233    Basic Metabolic Panel: Recent Labs  Lab 05/06/20 1101 05/12/20 2200  NA 139 138  K 3.8 3.9  CL 92* 91*  CO2 35* 36*  GLUCOSE 183* 92  BUN 19 20  CREATININE 1.44* 1.30*  CALCIUM 8.4* 8.5*   GFR: Estimated Creatinine Clearance: 61.9 mL/min (A) (by C-G formula based on SCr of 1.3 mg/dL (H)). Recent Labs  Lab 05/12/20 2200  WBC 7.3    Liver Function Tests: No results for input(s): AST, ALT, ALKPHOS, BILITOT, PROT,  ALBUMIN in the last 168 hours. No results for input(s): LIPASE, AMYLASE in the last 168 hours. No results for input(s): AMMONIA in the last 168 hours.  ABG    Component Value Date/Time   PHART 7.26 (L) 05/12/2020 2306   PCO2ART 99 (HH) 05/12/2020 2306   PO2ART 72 (L) 05/12/2020 2306   HCO3 44.4 (H) 05/12/2020 2306   TCO2 26  04/14/2009 0014   O2SAT 91.7 05/12/2020 2306     Coagulation Profile: Recent Labs  Lab 05/12/20 2200  INR 1.0    Cardiac Enzymes: No results for input(s): CKTOTAL, CKMB, CKMBINDEX, TROPONINI in the last 168 hours.  HbA1C: Hemoglobin A1C  Date/Time Value Ref Range Status  08/24/2011 01:10 AM 8.3 (H) 4.2 - 6.3 % Final    Comment:    The American Diabetes Association recommends that a primary goal of therapy should be <7% and that physicians should reevaluate the treatment regimen in patients with HbA1c values consistently >8%.   07/15/2011 05:03 AM 8.1 (H) 4.2 - 6.3 % Final    Comment:    The American Diabetes Association recommends that a primary goal of therapy should be <7% and that physicians should reevaluate the treatment regimen in patients with HbA1c values consistently >8%.    Hgb A1c MFr Bld  Date/Time Value Ref Range Status  03/21/2020 04:47 AM 7.8 (H) 4.8 - 5.6 % Final    Comment:    (NOTE) Pre diabetes:          5.7%-6.4%  Diabetes:              >6.4%  Glycemic control for   <7.0% adults with diabetes   02/18/2020 05:50 AM 7.9 (H) 4.8 - 5.6 % Final    Comment:    (NOTE) Pre diabetes:          5.7%-6.4%  Diabetes:              >6.4%  Glycemic control for   <7.0% adults with diabetes     CBG: No results for input(s): GLUCAP in the last 168 hours.  Review of Systems:   ***  Past Medical History:  He,  has a past medical history of AKI (acute kidney injury) (HCC) (09/12/2019), CAD (coronary artery disease), Chest pain, CHF (congestive heart failure) (HCC), Chronic pain syndrome, COPD (chronic obstructive pulmonary disease) (HCC), Degenerative cervical disc, Depression, Diabetes mellitus without complication (HCC), Dyslipidemia, GERD (gastroesophageal reflux disease), Hernia (2014), Hypertension, MRSA (methicillin resistant staph aureus) culture positive (2011), Neuropathy, Nut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   Surgical History:    Past Surgical History:  Procedure Laterality Date  . BACK SURGERY  2012  . CARDIOVERSION N/A 09/26/2019   Procedure: CARDIOVERSION;  Surgeon: Antonieta Iba, MD;  Location: [facility] ORS;  Service: Cardiovascular;  Laterality: N/A;  . CHOLECYSTECTOMY    . COLONOSCOPY  Jan 2014   [name]  . CORONARY ANGIOPLASTY WITH STENT PLACEMENT  2009   stents x2, in [place], [place]  . FOOT SURGERY     Right  . NECK SURGERY    . SPINE SURGERY  2012,2013  . TONSILLECTOMY       Social History:   reports that he has been smoking cigarettes. He has a 50.00 pack-year smoking history. He has never used smokeless tobacco. He reports that he does not drink alcohol and does not use drugs.   Family History:  His Family history is unknown by patient.   Allergies Allergies  Allergen Reactions  . Acetaminophen Other (  See Comments)    Reaction:  Unknown  Kidney failure Told not to take from home M.D. Related to kidney and renal failure   . Fentanyl   . Nsaids Other (See Comments)    Reaction:  Unknown  Kidney failure Patient states not to take from home M.D. Related to kidney and renal failure.  Other reaction(s): Unknown     Home Medications  Prior to Admission medications   Medication Sig Start Date End Date Taking? Authorizing Provider  alfuzosin (UROXATRAL) 10 MG 24 hr tablet Take 1 tablet (10 mg total) by mouth daily with breakfast. 04/29/20   Sondra Come, MD  amiodarone (PACERONE) 200 MG tablet Take 1 tablet (200 mg total) by mouth daily. 11/03/19   Marisue Ivan D, PA-C  atorvastatin (LIPITOR) 40 MG tablet Take 1 tablet (40 mg total) by mouth daily. 09/28/19   Lewie Chamber, MD  bisacodyl (DULCOLAX) 5 MG EC tablet Take by mouth.    [provider]  budesonide (PULMICORT) 0.5 MG/2ML nebulizer solution Take 0.5 mg by nebulization 2 (two) times daily.    [provider]  butalbital-acetaminophen-caffeine (FIORICET) [phone] MG tablet Take by mouth. 04/02/20   [provider]  Cholecalciferol (VITAMIN D3) 50 MCG (2000 UT) TABS Take 2,000 Units by mouth daily.     [provider]  doxycycline (VIBRAMYCIN) 100 MG capsule Take 100 mg by mouth 2 (two) times daily. 04/21/20   [provider]  DULoxetine (CYMBALTA) 60 MG capsule Take 60 mg by mouth daily.    [provider]  Ensure Max Protein (ENSURE MAX PROTEIN) LIQD Take 330 mLs (11 oz total) by mouth 2 (two) times daily between meals. 06/14/19   Sheikh, Omair Latif, DO  fexofenadine (ALLEGRA) 180 MG tablet Take 180 mg by mouth daily. 01/23/20   [provider]  fluticasone (FLONASE) 50 MCG/ACT nasal spray Place 1 spray into both nostrils 2 (two) times daily. 12/15/19   Pennie Banter, DO  Fluticasone-Umeclidin-Vilant (TRELEGY ELLIPTA) 100-62.5-25 MCG/INH AEPB Inhale 1 puff into the lungs daily.  10/20/19   [provider]  Fremanezumab-vfrm 225 MG/1.5ML SOAJ Inject into the skin. 04/02/20   [provider]  Galcanezumab-gnlm (EMGALITY, 300 MG DOSE,) 100 MG/ML SOSY Inject into the skin. 02/20/20   [provider]  glyBURIDE (DIABETA) 5 MG tablet Take by mouth at bedtime. 04/17/20   [provider]  glycopyrrolate (ROBINUL) 1 MG tablet Take 1 tablet (1 mg total) by mouth 3 (three) times daily. 02/21/20   Arnetha Courser, MD  guaiFENesin (MUCINEX) 600 MG 12 hr tablet Take 1 tablet (600 mg total) by mouth 2 (two) times daily. 02/21/20   Arnetha Courser, MD  insulin lispro (HUMALOG) 100 UNIT/ML injection Inject 4 Units into the skin See admin instructions. Inject 4 units with blood sugar 150-250; inject 6 units 251-350; greater than 351 call provider; Hold for FSBG <150    [provider]  ipratropium-albuterol (DUONEB) 0.5-2.5 (3) MG/3ML SOLN Take 3 mLs by nebulization every 6 (six) hours as needed. Patient taking differently: Take 3 mLs by nebulization every 6 (six) hours as needed (wheezing). 06/14/19   Marguerita Merles Latif, DO  lactulose  (CHRONULAC) 10 GM/15ML solution Take 30 mLs by mouth daily as needed. 03/31/20   [provider]  lamoTRIgine (LAMICTAL) 100 MG tablet Take 100 mg by mouth daily.    [provider]  LANTUS SOLOSTAR 100 UNIT/ML Solostar Pen Inject into the skin. 03/18/20   [provider]  magnesium oxide (MAG-OX) 400 (241.3 Mg) MG tablet Take 1 tablet by mouth daily. 12/24/19   [provider]  Menthol-Methyl Salicylate (SALONPAS PAIN RELIEF PATCH EX) Apply 1 patch topically every 12 (twelve) hours as needed.    [provider]  midodrine (PROAMATINE) 10 MG tablet Take 1 tablet (10 mg total) by mouth 2 (two) times daily. Take if top blood pressure reading is less than 100 02/16/20   Dunn, Ryan M, PA-C  NAC 600 MG CAPS Take 600 mg by mouth daily. 09/08/19   [provider]  nicotine (NICODERM CQ - DOSED IN MG/24 HOURS) 21 mg/24hr patch Place 21 mg onto the skin daily. 03/11/20   [provider]  NITROSTAT 0.4 MG SL tablet DISSOLVE (1) TABLET UNDER TONGUE AS NEEDED TO RELIEVE CHEST PAIN. MAYREPEAT EVERY 5 MINUTES. Patient taking differently: Place 0.4 mg under the tongue every 5 (five) minutes as needed for chest pain. 10/06/15   Iran Ouch, MD  ondansetron (ZOFRAN-ODT) 4 MG disintegrating tablet Take 1 tablet (4 mg total) by mouth every 8 (eight) hours as needed for nausea or vomiting. 02/21/20   Arnetha Courser, MD  Oxycodone HCl 10 MG TABS Take 1 tablet (10 mg total) by mouth 3 (three) times daily. 04/09/20   Leatha Gilding, MD  oxymetazoline (AFRIN) 0.05 % nasal spray Place 2 sprays into both nostrils 2 (two) times daily as needed for congestion.    [provider]  pantoprazole (PROTONIX) 40 MG tablet Take 40 mg by mouth daily.     [provider]  Potassium Chloride ER 20 MEQ TBCR Take 10 mEq by mouth daily. 04/27/20   Dunn, Raymon Mutton, PA-C  potassium chloride SA (KLOR-CON) 20 MEQ tablet Take 20 mEq by mouth daily. 04/17/20   [provider]  potassium citrate (UROCIT-K) 10 MEQ (1080 MG) SR tablet Take 10 mEq by mouth daily. 04/28/20   [provider]  predniSONE (DELTASONE) 2.5 MG tablet Take 2.5 mg by mouth daily. 04/17/20   [provider]  pregabalin (LYRICA) 200 MG capsule Take 1 capsule (200 mg total) by mouth 3 (three) times daily. 09/14/19   Danford, Earl Lites, MD  rivaroxaban (XARELTO) 20 MG TABS tablet Take 20 mg by mouth daily with supper.    [provider]  roflumilast (DALIRESP) 500 MCG TABS tablet Take 0.5 tablets (250 mcg total) by mouth daily. 02/21/20   Arnetha Courser, MD  senna-docusate (SENOKOT-S) 8.6-50 MG tablet Take 1 tablet by mouth daily.     [provider]  sodium chloride (OCEAN) 0.65 % SOLN nasal spray Place 1 spray into both nostrils as needed for congestion. 12/15/19   Pennie Banter, DO  sulfamethoxazole-trimethoprim (BACTRIM) 400-80 MG tablet Take 1 tablet by mouth daily. 04/22/20   [provider]  torsemide (DEMADEX) 20 MG tablet Take 1 tablet (20 mg total) by mouth daily. 04/27/20 07/26/20  Sondra Barges, PA-C  traZODone (DESYREL) 150 MG tablet Take 150 mg by mouth at bedtime. 01/23/20   [provider]  Ubrogepant (UBRELVY) 100 MG TABS Take 100 mg by mouth daily. 02/22/20   Arnetha Courser, MD  varenicline (CHANTIX) 1 MG tablet Take 1 mg by mouth 2 (two) times daily.    [provider]     Critical care time: ***

## 2020-05-12 NOTE — ED Triage Notes (Signed)
Pt to ED via EMS form [facility] . Pt was 70% on room air. Pt O2 sat increased to 94%. Pt has hx of COPD and CHF. Pt received a duoneb with EMS. EMS vitals; BP 139/72. HR 75. O2 sat 94% nonrebreather.

## 2020-05-12 NOTE — Telephone Encounter (Signed)
Pt scheduled tentatively for next week.

## 2020-05-12 NOTE — Telephone Encounter (Signed)
Patient was seen in the office on 04/29/20 with Dr. Richardo Hanks.  Dr. Richardo Hanks recommended changing to alfuzosin for more selective alpha blockade in preventing some of his hypotension, and Flomax and doxazosin were discontinued.  Initially, patient reported improved symptoms.  However, today patient is reporting that he is experiencing increased difficulty urinating. It is 1:40pm in the afternoon and patient states that he feels like he needs to urinate, but has had 2 unsuccesful attempts.  They are looking for advice or medication change.  Note:  Caller reports that the patient does not drink a good amount of fluids during the day.  She has advised him to increase his fluid intake as he has only had "about a cup of water" today so far.

## 2020-05-12 NOTE — H&P (Addendum)
NAME:  Gregory Hall, MRN:  [id number], DOB:  January 30, 1955, LOS: 0 ADMISSION DATE:  05/12/2020, CONSULTATION DATE:  05/12/20 REFERRING MD:  Willy Eddy, MD CHIEF COMPLAINT:  Altered Mental Status  History of Present Illness:  Gregory Hall is a 65 year old male with CAD, COPD, HFpEF, and DMII who presents from [facility] facility with SpO2 of 70% on room air. He was treated with duoneb by EMS and placed on non-rebreather mask with improvement in his SpO2. He was placed on Bipap upon arrival to the ER. He developed progressive mental status changes and an ABG showed pH 7.26, pCO2 99 and pO2 72. He was intubated in the ER.   PCCM has been called for admission due to acute on chronic hypercapnic respiratory failure.   Patient's family not answering their phones and unable to obtain further history from the patient.   Pertinent  Medical History  Coronary Artery Disease COPD Congestive Heart Failure Diabetes Mellitus Type II  Significant Hospital Events: Including procedures, antibiotic start and stop dates in addition to other pertinent events   . 5/4 Intubated in the ER due to hypercapnic respiratory failure  Interim History / Subjective:  n/a  Objective   Blood pressure 111/64, pulse 69, temperature 98.3 F (36.8 C), temperature source Oral, resp. rate (!) 40, height 5\' 8"  (1.727 m), weight 96 kg, SpO2 (!) 87 %.       No intake or output data in the 24 hours ending 05/12/20 2344 Filed Weights   05/12/20 2157  Weight: 96 kg    Examination: General: No acute distress, intubated, sedated HENT: [place]/AT, ET and OG tube in place. Pupils dilated but reactive Lungs: course breath sounds bilaterally. Scattered rhonchi.  Cardiovascular: rrr, no murmurs Abdomen: soft, non-distended, BS+ Extremities: 1-2+ bilateral edema, some redness, warm Neuro: sedated, no spontaneous movement GU: foley in place  Labs/imaging that I have personally reviewed  (right click and "Reselect  all SmartList Selections" daily)  ABG 5/4 at 2306 5/4 CMP, troponin, CBC, INR,  5/4 CXR - ET tube in place, OG tube in stomach, bibasilar atelectasis  Resolved Hospital Problem list     Assessment & Plan:   Acute Hypercapnic and Hypoxemic Respiratory Failure Hx of COPD - Continue mechanical ventilatory support - Budesonide + Brovana + Yupelri Nebs - Start 40mg  Prednisone for 5 days and doxycycline for 5 days for possible COPD exacerbation and pneumonia coverage - Precedex and PRN morphine for sedation/analgesia  Altered Mental Status Secondary to hypercapnia but also concern for polypharmacy - Avoid sedating or altering medications - Check Urine tox screen - Will need consolidated medication list upon discharge  Heart Failure preserved EF - lasix 40mg , appears volume up  Diabetes Mellitus Type II - sliding scale insulin  Coronary Artery Disease - continue atorvastatin  Hx of Prolonged Qt - will continue to monitor - QTc on admission 429  Best practice (right click and "Reselect all SmartList Selections" daily)  Diet:  NPO Pain/Anxiety/Delirium protocol (if indicated): Yes (RASS goal 0) VAP protocol (if indicated): Yes DVT prophylaxis: Subcutaneous Heparin GI prophylaxis: PPI Glucose control:  SSI Yes Central venous access:  N/A Arterial line:  N/A Foley:  Yes, and it is still needed Mobility:  bed rest  PT consulted: N/A Last date of multidisciplinary goals of care discussion: pending Code Status:  full code Disposition: ICU  Labs   CBC: Recent Labs  Lab 05/12/20 2200  WBC 7.3  HGB 11.1*  HCT 37.6*  MCV 86.0  PLT 233    Basic Metabolic Panel: Recent Labs  Lab 05/06/20 1101 05/12/20 2200  NA 139 138  K 3.8 3.9  CL 92* 91*  CO2 35* 36*  GLUCOSE 183* 92  BUN 19 20  CREATININE 1.44* 1.30*  CALCIUM 8.4* 8.5*   GFR: Estimated Creatinine Clearance: 61.9 mL/min (A) (by C-G formula based on SCr of 1.3 mg/dL (H)). Recent Labs  Lab 05/12/20 2200   WBC 7.3    Liver Function Tests: No results for input(s): AST, ALT, ALKPHOS, BILITOT, PROT, ALBUMIN in the last 168 hours. No results for input(s): LIPASE, AMYLASE in the last 168 hours. No results for input(s): AMMONIA in the last 168 hours.  ABG    Component Value Date/Time   PHART 7.26 (L) 05/12/2020 2306   PCO2ART 99 (HH) 05/12/2020 2306   PO2ART 72 (L) 05/12/2020 2306   HCO3 44.4 (H) 05/12/2020 2306   TCO2 26 04/14/2009 0014   O2SAT 91.7 05/12/2020 2306     Coagulation Profile: Recent Labs  Lab 05/12/20 2200  INR 1.0    Cardiac Enzymes: No results for input(s): CKTOTAL, CKMB, CKMBINDEX, TROPONINI in the last 168 hours.  HbA1C: Hemoglobin A1C  Date/Time Value Ref Range Status  08/24/2011 01:10 AM 8.3 (H) 4.2 - 6.3 % Final    Comment:    The American Diabetes Association recommends that a primary goal of therapy should be <7% and that physicians should reevaluate the treatment regimen in patients with HbA1c values consistently >8%.   07/15/2011 05:03 AM 8.1 (H) 4.2 - 6.3 % Final    Comment:    The American Diabetes Association recommends that a primary goal of therapy should be <7% and that physicians should reevaluate the treatment regimen in patients with HbA1c values consistently >8%.    Hgb A1c MFr Bld  Date/Time Value Ref Range Status  03/21/2020 04:47 AM 7.8 (H) 4.8 - 5.6 % Final    Comment:    (NOTE) Pre diabetes:          5.7%-6.4%  Diabetes:              >6.4%  Glycemic control for   <7.0% adults with diabetes   02/18/2020 05:50 AM 7.9 (H) 4.8 - 5.6 % Final    Comment:    (NOTE) Pre diabetes:          5.7%-6.4%  Diabetes:              >6.4%  Glycemic control for   <7.0% adults with diabetes     CBG: No results for input(s): GLUCAP in the last 168 hours.  Review of Systems:   Unable to perform review of systems due to patient's mental status.  Past Medical History:  He,  has a past medical history of AKI (acute kidney injury)  (HCC) (09/12/2019), CAD (coronary artery disease), Chest pain, CHF (congestive heart failure) (HCC), Chronic pain syndrome, COPD (chronic obstructive pulmonary disease) (HCC), Degenerative cervical disc, Depression, Diabetes mellitus without complication (HCC), Dyslipidemia, GERD (gastroesophageal reflux disease), Hernia (2014), Hypertension, MRSA (methicillin resistant staph aureus) culture positive (2011), Neuropathy, Nut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   Surgical History:   Past Surgical History:  Procedure Laterality Date  . BACK SURGERY  2012  . CARDIOVERSION N/A 09/26/2019   Procedure: CARDIOVERSION;  Surgeon: Antonieta Iba, MD;  Location: [facility] ORS;  Service: Cardiovascular;  Laterality: N/A;  . CHOLECYSTECTOMY    . COLONOSCOPY  Jan 2014   [name]  . CORONARY ANGIOPLASTY WITH  STENT PLACEMENT  2009   stents x2, in [place], [place]  . FOOT SURGERY     Right  . NECK SURGERY    . SPINE SURGERY  2012,2013  . TONSILLECTOMY       Social History:   reports that he has been smoking cigarettes. He has a 50.00 pack-year smoking history. He has never used smokeless tobacco. He reports that he does not drink alcohol and does not use drugs.   Family History:  His Family history is unknown by patient.   Allergies Allergies  Allergen Reactions  . Acetaminophen Other (See Comments)    Reaction:  Unknown  Kidney failure Told not to take from home M.D. Related to kidney and renal failure   . Fentanyl   . Nsaids Other (See Comments)    Reaction:  Unknown  Kidney failure Patient states not to take from home M.D. Related to kidney and renal failure.  Other reaction(s): Unknown     Home Medications  Prior to Admission medications   Medication Sig Start Date End Date Taking? Authorizing Provider  alfuzosin (UROXATRAL) 10 MG 24 hr tablet Take 1 tablet (10 mg total) by mouth daily with breakfast. 04/29/20   Sondra Come, MD  amiodarone (PACERONE) 200 MG tablet Take 1  tablet (200 mg total) by mouth daily. 11/03/19   Marisue Ivan D, PA-C  atorvastatin (LIPITOR) 40 MG tablet Take 1 tablet (40 mg total) by mouth daily. 09/28/19   Lewie Chamber, MD  bisacodyl (DULCOLAX) 5 MG EC tablet Take by mouth.    [provider]  budesonide (PULMICORT) 0.5 MG/2ML nebulizer solution Take 0.5 mg by nebulization 2 (two) times daily.    [provider]  butalbital-acetaminophen-caffeine (FIORICET) [phone] MG tablet Take by mouth. 04/02/20   [provider]  Cholecalciferol (VITAMIN D3) 50 MCG (2000 UT) TABS Take 2,000 Units by mouth daily.     [provider]  doxycycline (VIBRAMYCIN) 100 MG capsule Take 100 mg by mouth 2 (two) times daily. 04/21/20   [provider]  DULoxetine (CYMBALTA) 60 MG capsule Take 60 mg by mouth daily.    [provider]  Ensure Max Protein (ENSURE MAX PROTEIN) LIQD Take 330 mLs (11 oz total) by mouth 2 (two) times daily between meals. 06/14/19   Sheikh, Omair Latif, DO  fexofenadine (ALLEGRA) 180 MG tablet Take 180 mg by mouth daily. 01/23/20   [provider]  fluticasone (FLONASE) 50 MCG/ACT nasal spray Place 1 spray into both nostrils 2 (two) times daily. 12/15/19   Pennie Banter, DO  Fluticasone-Umeclidin-Vilant (TRELEGY ELLIPTA) 100-62.5-25 MCG/INH AEPB Inhale 1 puff into the lungs daily.  10/20/19   [provider]  Fremanezumab-vfrm 225 MG/1.5ML SOAJ Inject into the skin. 04/02/20   [provider]  Galcanezumab-gnlm (EMGALITY, 300 MG DOSE,) 100 MG/ML SOSY Inject into the skin. 02/20/20   [provider]  glyBURIDE (DIABETA) 5 MG tablet Take by mouth at bedtime. 04/17/20   [provider]  glycopyrrolate (ROBINUL) 1 MG tablet Take 1 tablet (1 mg total) by mouth 3 (three) times daily. 02/21/20   Arnetha Courser, MD  guaiFENesin (MUCINEX) 600 MG 12 hr tablet Take 1 tablet (600 mg total) by mouth 2 (two) times daily. 02/21/20   Arnetha Courser, MD  insulin  lispro (HUMALOG) 100 UNIT/ML injection Inject 4 Units into the skin See admin instructions. Inject 4 units with blood sugar 150-250; inject 6 units 251-350; greater than 351 call provider; Hold for FSBG <150  [provider]  ipratropium-albuterol (DUONEB) 0.5-2.5 (3) MG/3ML SOLN Take 3 mLs by nebulization every 6 (six) hours as needed. Patient taking differently: Take 3 mLs by nebulization every 6 (six) hours as needed (wheezing). 06/14/19   Marguerita Merles Latif, DO  lactulose (CHRONULAC) 10 GM/15ML solution Take 30 mLs by mouth daily as needed. 03/31/20   [provider]  lamoTRIgine (LAMICTAL) 100 MG tablet Take 100 mg by mouth daily.    [provider]  LANTUS SOLOSTAR 100 UNIT/ML Solostar Pen Inject into the skin. 03/18/20   [provider]  magnesium oxide (MAG-OX) 400 (241.3 Mg) MG tablet Take 1 tablet by mouth daily. 12/24/19   [provider]  Menthol-Methyl Salicylate (SALONPAS PAIN RELIEF PATCH EX) Apply 1 patch topically every 12 (twelve) hours as needed.    [provider]  midodrine (PROAMATINE) 10 MG tablet Take 1 tablet (10 mg total) by mouth 2 (two) times daily. Take if top blood pressure reading is less than 100 02/16/20   Dunn, Ryan M, PA-C  NAC 600 MG CAPS Take 600 mg by mouth daily. 09/08/19   [provider]  nicotine (NICODERM CQ - DOSED IN MG/24 HOURS) 21 mg/24hr patch Place 21 mg onto the skin daily. 03/11/20   [provider]  NITROSTAT 0.4 MG SL tablet DISSOLVE (1) TABLET UNDER TONGUE AS NEEDED TO RELIEVE CHEST PAIN. MAYREPEAT EVERY 5 MINUTES. Patient taking differently: Place 0.4 mg under the tongue every 5 (five) minutes as needed for chest pain. 10/06/15   Iran Ouch, MD  ondansetron (ZOFRAN-ODT) 4 MG disintegrating tablet Take 1 tablet (4 mg total) by mouth every 8 (eight) hours as needed for nausea or vomiting. 02/21/20   Arnetha Courser, MD  Oxycodone HCl 10 MG TABS Take 1 tablet (10 mg total) by mouth 3  (three) times daily. 04/09/20   Leatha Gilding, MD  oxymetazoline (AFRIN) 0.05 % nasal spray Place 2 sprays into both nostrils 2 (two) times daily as needed for congestion.    [provider]  pantoprazole (PROTONIX) 40 MG tablet Take 40 mg by mouth daily.     [provider]  Potassium Chloride ER 20 MEQ TBCR Take 10 mEq by mouth daily. 04/27/20   Dunn, Raymon Mutton, PA-C  potassium chloride SA (KLOR-CON) 20 MEQ tablet Take 20 mEq by mouth daily. 04/17/20   [provider]  potassium citrate (UROCIT-K) 10 MEQ (1080 MG) SR tablet Take 10 mEq by mouth daily. 04/28/20   [provider]  predniSONE (DELTASONE) 2.5 MG tablet Take 2.5 mg by mouth daily. 04/17/20   [provider]  pregabalin (LYRICA) 200 MG capsule Take 1 capsule (200 mg total) by mouth 3 (three) times daily. 09/14/19   Danford, Earl Lites, MD  rivaroxaban (XARELTO) 20 MG TABS tablet Take 20 mg by mouth daily with supper.    [provider]  roflumilast (DALIRESP) 500 MCG TABS tablet Take 0.5 tablets (250 mcg total) by mouth daily. 02/21/20   Arnetha Courser, MD  senna-docusate (SENOKOT-S) 8.6-50 MG tablet Take 1 tablet by mouth daily.     [provider]  sodium chloride (OCEAN) 0.65 % SOLN nasal spray Place 1 spray into both nostrils as needed for congestion. 12/15/19   Pennie Banter, DO  sulfamethoxazole-trimethoprim (BACTRIM) 400-80 MG tablet Take 1 tablet by mouth daily. 04/22/20   [provider]  torsemide (DEMADEX) 20 MG tablet Take 1 tablet (20 mg total) by mouth daily. 04/27/20 07/26/20  Eula Listen  M, PA-C  traZODone (DESYREL) 150 MG tablet Take 150 mg by mouth at bedtime. 01/23/20   [provider]  Ubrogepant (UBRELVY) 100 MG TABS Take 100 mg by mouth daily. 02/22/20   Arnetha Courser, MD  varenicline (CHANTIX) 1 MG tablet Take 1 mg by mouth 2 (two) times daily.    [provider]     Critical care time: 45 minutes    Melody Comas, MD [facility]  Pulmonary & Critical Care Office: [phone]   See Amion for personal pager Please call Elink 7p-7a. [phone]

## 2020-05-12 NOTE — Telephone Encounter (Addendum)
Called Brandy at [facility], she reiterated the information given below. I offered for her to make patient an appointment this afternoon or tomorrow in clinic for PVR. She declines as they need 3 days notice for patient transportation services. She states that the patient has a home health nurse who may be able to assist.  [place] home health nurse, informed her of the information above. She gave verbal understanding and states that she visited with the patient yesterday at which time he denied any complaints. She states that an order may be faxed to [phone] for PRN visit to in and out cath patient. Order faxed.

## 2020-05-13 ENCOUNTER — Inpatient Hospital Stay: Payer: Medicare Other

## 2020-05-13 DIAGNOSIS — J440 Chronic obstructive pulmonary disease with acute lower respiratory infection: Secondary | ICD-10-CM | POA: Diagnosis present

## 2020-05-13 DIAGNOSIS — G9340 Encephalopathy, unspecified: Secondary | ICD-10-CM | POA: Diagnosis not present

## 2020-05-13 DIAGNOSIS — J9622 Acute and chronic respiratory failure with hypercapnia: Secondary | ICD-10-CM

## 2020-05-13 DIAGNOSIS — Z79899 Other long term (current) drug therapy: Secondary | ICD-10-CM | POA: Diagnosis not present

## 2020-05-13 DIAGNOSIS — G894 Chronic pain syndrome: Secondary | ICD-10-CM | POA: Diagnosis present

## 2020-05-13 DIAGNOSIS — J441 Chronic obstructive pulmonary disease with (acute) exacerbation: Secondary | ICD-10-CM | POA: Diagnosis present

## 2020-05-13 DIAGNOSIS — J9621 Acute and chronic respiratory failure with hypoxia: Secondary | ICD-10-CM | POA: Diagnosis present

## 2020-05-13 DIAGNOSIS — G929 Unspecified toxic encephalopathy: Secondary | ICD-10-CM | POA: Diagnosis present

## 2020-05-13 DIAGNOSIS — Z7951 Long term (current) use of inhaled steroids: Secondary | ICD-10-CM | POA: Diagnosis not present

## 2020-05-13 DIAGNOSIS — I5032 Chronic diastolic (congestive) heart failure: Secondary | ICD-10-CM | POA: Diagnosis present

## 2020-05-13 DIAGNOSIS — Z20822 Contact with and (suspected) exposure to covid-19: Secondary | ICD-10-CM | POA: Diagnosis present

## 2020-05-13 DIAGNOSIS — E876 Hypokalemia: Secondary | ICD-10-CM | POA: Diagnosis present

## 2020-05-13 DIAGNOSIS — I2510 Atherosclerotic heart disease of native coronary artery without angina pectoris: Secondary | ICD-10-CM | POA: Diagnosis present

## 2020-05-13 DIAGNOSIS — Z955 Presence of coronary angioplasty implant and graft: Secondary | ICD-10-CM | POA: Diagnosis not present

## 2020-05-13 DIAGNOSIS — E1140 Type 2 diabetes mellitus with diabetic neuropathy, unspecified: Secondary | ICD-10-CM | POA: Diagnosis present

## 2020-05-13 DIAGNOSIS — J449 Chronic obstructive pulmonary disease, unspecified: Secondary | ICD-10-CM | POA: Diagnosis not present

## 2020-05-13 DIAGNOSIS — I110 Hypertensive heart disease with heart failure: Secondary | ICD-10-CM | POA: Diagnosis present

## 2020-05-13 DIAGNOSIS — Z7189 Other specified counseling: Secondary | ICD-10-CM | POA: Diagnosis not present

## 2020-05-13 DIAGNOSIS — J9601 Acute respiratory failure with hypoxia: Secondary | ICD-10-CM | POA: Diagnosis not present

## 2020-05-13 DIAGNOSIS — G928 Other toxic encephalopathy: Secondary | ICD-10-CM | POA: Diagnosis present

## 2020-05-13 DIAGNOSIS — R0602 Shortness of breath: Secondary | ICD-10-CM | POA: Diagnosis present

## 2020-05-13 DIAGNOSIS — F17210 Nicotine dependence, cigarettes, uncomplicated: Secondary | ICD-10-CM | POA: Diagnosis present

## 2020-05-13 DIAGNOSIS — Z515 Encounter for palliative care: Secondary | ICD-10-CM | POA: Diagnosis not present

## 2020-05-13 DIAGNOSIS — Z7901 Long term (current) use of anticoagulants: Secondary | ICD-10-CM | POA: Diagnosis not present

## 2020-05-13 DIAGNOSIS — J9602 Acute respiratory failure with hypercapnia: Secondary | ICD-10-CM | POA: Diagnosis not present

## 2020-05-13 DIAGNOSIS — T423X1A Poisoning by barbiturates, accidental (unintentional), initial encounter: Secondary | ICD-10-CM | POA: Diagnosis present

## 2020-05-13 DIAGNOSIS — J9692 Respiratory failure, unspecified with hypercapnia: Secondary | ICD-10-CM | POA: Diagnosis present

## 2020-05-13 DIAGNOSIS — Z794 Long term (current) use of insulin: Secondary | ICD-10-CM | POA: Diagnosis not present

## 2020-05-13 DIAGNOSIS — Z7984 Long term (current) use of oral hypoglycemic drugs: Secondary | ICD-10-CM | POA: Diagnosis not present

## 2020-05-13 DIAGNOSIS — E785 Hyperlipidemia, unspecified: Secondary | ICD-10-CM | POA: Diagnosis present

## 2020-05-13 DIAGNOSIS — K219 Gastro-esophageal reflux disease without esophagitis: Secondary | ICD-10-CM | POA: Diagnosis present

## 2020-05-13 DIAGNOSIS — J189 Pneumonia, unspecified organism: Secondary | ICD-10-CM | POA: Diagnosis present

## 2020-05-13 LAB — URINALYSIS, ROUTINE W REFLEX MICROSCOPIC
Bilirubin Urine: NEGATIVE
Glucose, UA: NEGATIVE mg/dL
Hgb urine dipstick: NEGATIVE
Ketones, ur: NEGATIVE mg/dL
Leukocytes,Ua: NEGATIVE
Nitrite: NEGATIVE
Protein, ur: NEGATIVE mg/dL
Specific Gravity, Urine: 1.012 (ref 1.005–1.030)
pH: 6 (ref 5.0–8.0)

## 2020-05-13 LAB — GLUCOSE, CAPILLARY: Glucose-Capillary: 100 mg/dL — ABNORMAL HIGH (ref 70–99)

## 2020-05-13 LAB — BLOOD GAS, ARTERIAL
Acid-Base Excess: 16.4 mmol/L — ABNORMAL HIGH (ref 0.0–2.0)
Acid-Base Excess: 14.9 mmol/L — ABNORMAL HIGH (ref 0.0–2.0)
Bicarbonate: 42.7 mmol/L — ABNORMAL HIGH (ref 20.0–28.0)
Bicarbonate: 41.8 mmol/L — ABNORMAL HIGH (ref 20.0–28.0)
FIO2: 50
Hi Frequency JET Vent Rate: 5
MECHVT: 500 mL
Mechanical Rate: 20
O2 Saturation: 94.7 %
O2 Saturation: 95.1 %
PEEP: 5 cmH2O
Patient temperature: 37
Patient temperature: 37
Pressure support: 10 cmH2O
pCO2 arterial: 60 mmHg — ABNORMAL HIGH (ref 32.0–48.0)
pCO2 arterial: 63 mmHg — ABNORMAL HIGH (ref 32.0–48.0)
pH, Arterial: 7.46 — ABNORMAL HIGH (ref 7.350–7.450)
pH, Arterial: 7.43 (ref 7.350–7.450)
pO2, Arterial: 70 mmHg — ABNORMAL LOW (ref 83.0–108.0)
pO2, Arterial: 74 mmHg — ABNORMAL LOW (ref 83.0–108.0)

## 2020-05-13 LAB — URINE DRUG SCREEN, QUALITATIVE (ARMC ONLY)
Amphetamines, Ur Screen: NOT DETECTED
Barbiturates, Ur Screen: POSITIVE — AB
Benzodiazepine, Ur Scrn: NOT DETECTED
Cannabinoid 50 Ng, Ur ~~LOC~~: NOT DETECTED
Cocaine Metabolite,Ur ~~LOC~~: NOT DETECTED
MDMA (Ecstasy)Ur Screen: NOT DETECTED
Methadone Scn, Ur: NOT DETECTED
Opiate, Ur Screen: NOT DETECTED
Phencyclidine (PCP) Ur S: NOT DETECTED
Tricyclic, Ur Screen: NOT DETECTED

## 2020-05-13 LAB — CBC
HCT: 36.9 % — ABNORMAL LOW (ref 39.0–52.0)
Hemoglobin: 11.1 g/dL — ABNORMAL LOW (ref 13.0–17.0)
MCH: 25.3 pg — ABNORMAL LOW (ref 26.0–34.0)
MCHC: 30.1 g/dL (ref 30.0–36.0)
MCV: 84.2 fL (ref 80.0–100.0)
Platelets: 222 10*3/uL (ref 150–400)
RBC: 4.38 MIL/uL (ref 4.22–5.81)
RDW: 23 % — ABNORMAL HIGH (ref 11.5–15.5)
WBC: 7.2 10*3/uL (ref 4.0–10.5)
nRBC: 0 % (ref 0.0–0.2)

## 2020-05-13 LAB — MAGNESIUM: Magnesium: 2 mg/dL (ref 1.7–2.4)

## 2020-05-13 LAB — BASIC METABOLIC PANEL
Anion gap: 11 (ref 5–15)
BUN: 20 mg/dL (ref 8–23)
CO2: 37 mmol/L — ABNORMAL HIGH (ref 22–32)
Calcium: 8.2 mg/dL — ABNORMAL LOW (ref 8.9–10.3)
Chloride: 88 mmol/L — ABNORMAL LOW (ref 98–111)
Creatinine, Ser: 1.47 mg/dL — ABNORMAL HIGH (ref 0.61–1.24)
GFR, Estimated: 52 mL/min — ABNORMAL LOW (ref >60)
Glucose, Bld: 139 mg/dL — ABNORMAL HIGH (ref 70–99)
Potassium: 3.7 mmol/L (ref 3.5–5.1)
Sodium: 136 mmol/L (ref 135–145)

## 2020-05-13 LAB — MRSA PCR SCREENING: MRSA by PCR: NEGATIVE

## 2020-05-13 LAB — BRAIN NATRIURETIC PEPTIDE: B Natriuretic Peptide: 217.2 pg/mL — ABNORMAL HIGH (ref 0.0–100.0)

## 2020-05-13 LAB — PROCALCITONIN: Procalcitonin: 0.1 ng/mL

## 2020-05-13 LAB — TROPONIN I (HIGH SENSITIVITY): Troponin I (High Sensitivity): 9 ng/L (ref <18)

## 2020-05-13 MED ORDER — BUDESONIDE 0.5 MG/2ML IN SUSP
0.5000 mg | Freq: Two times a day (BID) | RESPIRATORY_TRACT | Status: DC
  Administered 2020-05-13 – 2020-05-18 (×11): 0.5 mg via RESPIRATORY_TRACT
  Filled 2020-05-13 (×11): qty 2

## 2020-05-13 MED ORDER — PREDNISONE 5 MG/5ML PO SOLN
40.0000 mg | Freq: Every day | ORAL | Status: DC
  Administered 2020-05-13: 40 mg
  Filled 2020-05-13 (×2): qty 40

## 2020-05-13 MED ORDER — CHLORHEXIDINE GLUCONATE 0.12 % MT SOLN
15.0000 mL | Freq: Two times a day (BID) | OROMUCOSAL | Status: DC
  Administered 2020-05-13 – 2020-05-16 (×6): 15 mL via OROMUCOSAL
  Filled 2020-05-13 (×7): qty 15

## 2020-05-13 MED ORDER — PANTOPRAZOLE SODIUM 40 MG IV SOLR
40.0000 mg | Freq: Every day | INTRAVENOUS | Status: DC
  Administered 2020-05-13 (×2): 40 mg via INTRAVENOUS
  Filled 2020-05-13 (×2): qty 40

## 2020-05-13 MED ORDER — SODIUM CHLORIDE 0.9 % IV SOLN
100.0000 mg | Freq: Two times a day (BID) | INTRAVENOUS | Status: DC
  Administered 2020-05-13 – 2020-05-17 (×10): 100 mg via INTRAVENOUS
  Filled 2020-05-13 (×12): qty 100

## 2020-05-13 MED ORDER — IPRATROPIUM-ALBUTEROL 0.5-2.5 (3) MG/3ML IN SOLN
3.0000 mL | Freq: Four times a day (QID) | RESPIRATORY_TRACT | Status: DC | PRN
  Administered 2020-05-13 – 2020-05-14 (×2): 3 mL via RESPIRATORY_TRACT
  Filled 2020-05-13 (×2): qty 3

## 2020-05-13 MED ORDER — HYDROMORPHONE HCL 1 MG/ML IJ SOLN: 0.5000 mg | INTRAMUSCULAR | Status: DC | PRN

## 2020-05-13 MED ORDER — BUPRENORPHINE HCL-NALOXONE HCL 2-0.5 MG SL SUBL
2.0000 | SUBLINGUAL_TABLET | Freq: Two times a day (BID) | SUBLINGUAL | Status: DC
  Administered 2020-05-13 – 2020-05-14 (×2): 2 via SUBLINGUAL
  Filled 2020-05-13 (×2): qty 2

## 2020-05-13 MED ORDER — FUROSEMIDE 10 MG/ML IJ SOLN
40.0000 mg | Freq: Once | INTRAMUSCULAR | Status: AC
  Administered 2020-05-13: 40 mg via INTRAVENOUS
  Filled 2020-05-13: qty 4

## 2020-05-13 MED ORDER — POLYETHYLENE GLYCOL 3350 17 G PO PACK: 17.0000 g | PACK | Freq: Every day | ORAL | Status: DC | PRN

## 2020-05-13 MED ORDER — DIPHENHYDRAMINE HCL 50 MG/ML IJ SOLN
50.0000 mg | Freq: Once | INTRAMUSCULAR | Status: AC
  Administered 2020-05-13: 50 mg via INTRAVENOUS
  Filled 2020-05-13: qty 1

## 2020-05-13 MED ORDER — ARFORMOTEROL TARTRATE 15 MCG/2ML IN NEBU
15.0000 ug | INHALATION_SOLUTION | Freq: Two times a day (BID) | RESPIRATORY_TRACT | Status: DC
  Administered 2020-05-13 – 2020-05-18 (×11): 15 ug via RESPIRATORY_TRACT
  Filled 2020-05-13 (×14): qty 2

## 2020-05-13 MED ORDER — HEPARIN SODIUM (PORCINE) 5000 UNIT/ML IJ SOLN
5000.0000 [IU] | Freq: Three times a day (TID) | INTRAMUSCULAR | Status: DC
  Administered 2020-05-13 – 2020-05-18 (×17): 5000 [IU] via SUBCUTANEOUS
  Filled 2020-05-13 (×17): qty 1

## 2020-05-13 MED ORDER — REVEFENACIN 175 MCG/3ML IN SOLN
175.0000 ug | Freq: Every day | RESPIRATORY_TRACT | Status: DC
  Administered 2020-05-13 – 2020-05-18 (×6): 175 ug via RESPIRATORY_TRACT
  Filled 2020-05-13 (×7): qty 3

## 2020-05-13 MED ORDER — IPRATROPIUM-ALBUTEROL 0.5-2.5 (3) MG/3ML IN SOLN
RESPIRATORY_TRACT | Status: AC
  Administered 2020-05-14: 3 mL
  Filled 2020-05-13: qty 3

## 2020-05-13 MED ORDER — ORAL CARE MOUTH RINSE
15.0000 mL | Freq: Two times a day (BID) | OROMUCOSAL | Status: DC
  Administered 2020-05-13 – 2020-05-15 (×4): 15 mL via OROMUCOSAL

## 2020-05-13 MED ORDER — DOCUSATE SODIUM 100 MG PO CAPS: 100.0000 mg | ORAL_CAPSULE | Freq: Two times a day (BID) | ORAL | Status: DC | PRN

## 2020-05-13 MED ORDER — CHLORHEXIDINE GLUCONATE CLOTH 2 % EX PADS
6.0000 | MEDICATED_PAD | Freq: Every day | CUTANEOUS | Status: DC
  Administered 2020-05-14 – 2020-05-17 (×4): 6 via TOPICAL

## 2020-05-13 MED ORDER — MORPHINE SULFATE (PF) 2 MG/ML IV SOLN: 2.0000 mg | INTRAVENOUS | Status: DC | PRN

## 2020-05-13 MED ORDER — OXYCODONE HCL 5 MG PO TABS
5.0000 mg | ORAL_TABLET | Freq: Two times a day (BID) | ORAL | Status: DC | PRN
Start: 2020-05-13 — End: 2020-05-13
  Administered 2020-05-13: 5 mg via ORAL
  Filled 2020-05-13: qty 1

## 2020-05-13 NOTE — ED Notes (Signed)
Respiratory made aware of breathing treatment orders, stated tx is due at 0800 and 2000. Awaiting assigned nurse for report in ICU

## 2020-05-13 NOTE — ED Notes (Signed)
Report given to receiving RN via phone. Awaiting RT for transportation of patient

## 2020-05-13 NOTE — ED Notes (Signed)
Pt tolerating sedation well, pt breathing synonymous with the vent. Side rails up x2. MD Dewald assessed pt in the ED. Awaiting ICU ready bed. Belongings - wallet, shoes, pants, and cut tshirt in belongings bag at pt bedside.

## 2020-05-13 NOTE — Progress Notes (Signed)
65 year old male with a PMH of CAD, COPD, HFpEF, and DMII who presents from [facility] facility with Acute Hypoxic Hypercapnic Respiratory Failure and Altered Mental Status. Required intubation in the ED. Suspect possibly due to polysubstance use. He was extubated on 05/13/20  and remains hemodynamically stable.    Hospitalist service will take over care tomorrow.  This is a non-billable note.

## 2020-05-13 NOTE — Progress Notes (Signed)
eLink Physician-Brief Progress Note Patient Name: Gregory Hall DOB: [birth year] MRN: [id number]   Date of Service  05/13/2020  HPI/Events of Note  Patient admitted with altered mental status secondary to acute on chronic hypoxemic hypercapneic respiratory failure due to exacerbation of COPD, he was intubated in the ED and is currently mechanically ventilated.  eICU Interventions  New Patient Evaluation. Foley catheter ordered. PRN Dilaudid ordered for analgesia / sedation, in addition to Precedex.        Gregory Hall Gregory Hall 05/13/2020, 2:56 AM

## 2020-05-13 NOTE — Progress Notes (Addendum)
Patient extubated 10:10 am. Patient alert, no complaints of shortness pf breath. Generalized chronic back pain. MD placed orders for diet and tolerated well. Adequate urine output. Patient made step down. Continue to assess.

## 2020-05-13 NOTE — Progress Notes (Signed)
PHARMACY CONSULT NOTE - FOLLOW UP  Pharmacy Consult for Electrolyte Monitoring and Replacement   Recent Labs: Potassium (mmol/L)  Date Value  05/13/2020 3.7  03/31/2013 3.6   Magnesium (mg/dL)  Date Value  02/40/9735 2.0  06/25/2012 2.4   Calcium (mg/dL)  Date Value  32/99/2426 8.2 (L)   Calcium, Total (mg/dL)  Date Value  83/41/9622 7.6 (L)   Albumin (g/dL)  Date Value  29/79/8921 2.7 (L)  11/03/2019 4.6  03/30/2013 3.2 (L)   Phosphorus (mg/dL)  Date Value  19/41/7408 3.1  03/20/2012 3.4   Sodium (mmol/L)  Date Value  05/13/2020 136  11/03/2019 141  03/31/2013 142     Assessment: 65 year old male with CAD, COPD, HFpEF, and DMII who presents from [facility] facility with SpO2 of 70% on room air. He was treated with duoneb by EMS and placed on non-rebreather mask with improvement in his SpO2. He was placed on Bipap upon arrival to the ER. Patient was intubated 5/4 then extubated 5/5. Pharmacy has been consulted for electrolyte management.   Goal of Therapy:  Electrolytes WNL  Plan:   Electrolytes WNL, no replacement needed at this time  Re-check electrolytes with AM labs  Raiford Noble, PharmD Pharmacy Resident  05/13/2020 6:36 AM

## 2020-05-13 NOTE — Progress Notes (Signed)
NAME:  Gregory Hall, MRN:  [id number], DOB:  1955-01-18, LOS: 0 ADMISSION DATE:  05/12/2020, CONSULTATION DATE:  05/12/20 REFERRING MD:  Merlyn Lot, MD CHIEF COMPLAINT:  Altered Mental Status  History of Present Illness:  Gregory Hall is a 65 year old male with CAD, COPD, HFpEF, and DMII who presents from [facility] facility with SpO2 of 70% on room air. He was treated with duoneb by EMS and placed on non-rebreather mask with improvement in his SpO2. He was placed on Bipap upon arrival to the ER. He developed progressive mental status changes and an ABG showed pH 7.26, pCO2 99 and pO2 72. He was intubated in the ER.   PCCM has been called for admission due to acute on chronic hypercapnic respiratory failure.   Patient's family not answering their phones and unable to obtain further history from the patient.   Pertinent  Medical History  Coronary Artery Disease COPD Congestive Heart Failure Diabetes Mellitus Type II  Significant Hospital Events: Including procedures, antibiotic start and stop dates in addition to other pertinent events   . 5/4 Intubated in the ER due to hypercapnic respiratory failure . 5/5: Pt lethargic but easily arouseable, plan for SBT ~successfully extubated  Interim History / Subjective:  Admitted overnight requiring intubation in ED Pt is lethargic but easily arousable SBT in progress ~ successfully extubated Afebrile, hemodynamically stable, no Vasopressors  Objective   Blood pressure 119/69, pulse 61, temperature 100.04 F (37.8 C), resp. rate 16, height 5' 8"  (1.727 m), weight 98.3 kg, SpO2 95 %.    Vent Mode: PRVC FiO2 (%):  [45 %-50 %] 45 % Set Rate:  [20 bmp-22 bmp] 22 bmp Vt Set:  [500 mL] 500 mL PEEP:  [5 cmH20] 5 cmH20 Plateau Pressure:  [19 cmH20] 19 cmH20   Intake/Output Summary (Last 24 hours) at 05/13/2020 0816 Last data filed at 05/13/2020 0500 Gross per 24 hour  Intake 302.81 ml  Output 750 ml  Net -447.19 ml   Filed  Weights   05/12/20 2157 05/13/20 0220  Weight: 96 kg 98.3 kg    Examination: General: Acutely ill-appearing male, sitting in bed, mechanically intubated with spontaneous breathing trial in process, no acute distress HENT: Atraumatic, normocephalic, neck supple, no JVD, ET tube in place, mucous membranes moist Lungs: Faint inspiratory wheezing throughout, otherwise clear.  Synchronous with ventilator, even, nonlabored Cardiovascular: Regular rate and rhythm, S1-S2, no murmurs, rubs, gallops, 2+ distal pulses Abdomen: Soft, nontender, nondistended, no guarding rebound tenderness, bowel sounds positive x4 Extremities: Warm and dry.  No deformities, normal bulk and tone.  2+ edema bilateral lower extremities Neuro: Lethargic but arouses easily to voice, moves all 4 extremities to command, pupils PERRLA GU: foley in place  Labs/imaging that I have personally reviewed  (right click and "Reselect all SmartList Selections" daily)  Labs 5/5: Bicarb 37, glucose 139, BUN 20, creatinine 1.47, hemoglobin 11.1, hematocrit 36.9 ABG: pH 743/PCO2 63/PO2 74/bicarb 41.8  Resolved Hospital Problem list     Assessment & Plan:   Acute Hypercapnic and Hypoxemic Respiratory Failure Hx of COPD - Continue mechanical ventilatory support with lung protective strategies -Wean FiO2 & PEEP as tolerated to maintain O2 sats 88 to 92% -Follow intermittent CXR & ABG as needed -Implement VAP bundle -Spontaneous breathing trials when respiratory parameters met and mental status permits - Budesonide + Brovana + Yupelri Nebs - Continue 55m Prednisone for 5 days and doxycycline for 5 days for possible COPD exacerbation and pneumonia coverage  Altered  Mental Status, suspect secondary to hypercapnia but also concern for polypharmacy Sedation needs in setting of Mechanical Ventilation -Maintain RASS goal of 0 to -1 -Precedex as needed -Avoid sedating meds as able -UDS positive for Barbiturates -Daily wake up  assessment - Will need consolidated medication list upon discharge  Heart Failure preserved EF PMHx of CAD, Prolonged Qt -Continuous cardiac monitoring -Diuresis as BP and renal function permits ~ currently holding Lasix 5/5 due soft BP -QTc on admission 429  Diabetes Mellitus Type II -CBG's -SSI -Follow ICU Hypo/Hyperglycemia protocol   Best practice (right click and "Reselect all SmartList Selections" daily)  Diet:  NPO Pain/Anxiety/Delirium protocol (if indicated): Yes (RASS goal 0) VAP protocol (if indicated): Yes DVT prophylaxis: Subcutaneous Heparin GI prophylaxis: PPI Glucose control:  SSI Yes Central venous access:  N/A Arterial line:  N/A Foley:  Yes, and it is still needed Mobility:  bed rest  PT consulted: N/A Last date of multidisciplinary goals of care discussion: 05/13/2020 Code Status:  full code Disposition: ICU  Labs   CBC: Recent Labs  Lab 05/12/20 2200 05/13/20 0432  WBC 7.3 7.2  HGB 11.1* 11.1*  HCT 37.6* 36.9*  MCV 86.0 84.2  PLT 233 290    Basic Metabolic Panel: Recent Labs  Lab 05/06/20 1101 05/12/20 2200 05/13/20 0432  NA 139 138 136  K 3.8 3.9 3.7  CL 92* 91* 88*  CO2 35* 36* 37*  GLUCOSE 183* 92 139*  BUN 19 20 20   CREATININE 1.44* 1.30* 1.47*  CALCIUM 8.4* 8.5* 8.2*  MG  --   --  2.0   GFR: Estimated Creatinine Clearance: 55.5 mL/min (A) (by C-G formula based on SCr of 1.47 mg/dL (H)). Recent Labs  Lab 05/12/20 2200 05/13/20 0432  PROCALCITON 0.10  --   WBC 7.3 7.2    Liver Function Tests: No results for input(s): AST, ALT, ALKPHOS, BILITOT, PROT, ALBUMIN in the last 168 hours. No results for input(s): LIPASE, AMYLASE in the last 168 hours. No results for input(s): AMMONIA in the last 168 hours.  ABG    Component Value Date/Time   PHART 7.43 05/13/2020 0743   PCO2ART 63 (H) 05/13/2020 0743   PO2ART 74 (L) 05/13/2020 0743   HCO3 41.8 (H) 05/13/2020 0743   TCO2 26 04/14/2009 0014   O2SAT 95.1 05/13/2020 0743      Coagulation Profile: Recent Labs  Lab 05/12/20 2200  INR 1.0    Cardiac Enzymes: No results for input(s): CKTOTAL, CKMB, CKMBINDEX, TROPONINI in the last 168 hours.  HbA1C: Hemoglobin A1C  Date/Time Value Ref Range Status  08/24/2011 01:10 AM 8.3 (H) 4.2 - 6.3 % Final    Comment:    The American Diabetes Association recommends that a primary goal of therapy should be <7% and that physicians should reevaluate the treatment regimen in patients with HbA1c values consistently >8%.   07/15/2011 05:03 AM 8.1 (H) 4.2 - 6.3 % Final    Comment:    The American Diabetes Association recommends that a primary goal of therapy should be <7% and that physicians should reevaluate the treatment regimen in patients with HbA1c values consistently >8%.    Hgb A1c MFr Bld  Date/Time Value Ref Range Status  03/21/2020 04:47 AM 7.8 (H) 4.8 - 5.6 % Final    Comment:    (NOTE) Pre diabetes:          5.7%-6.4%  Diabetes:              >6.4%  Glycemic  control for   <7.0% adults with diabetes   02/18/2020 05:50 AM 7.9 (H) 4.8 - 5.6 % Final    Comment:    (NOTE) Pre diabetes:          5.7%-6.4%  Diabetes:              >6.4%  Glycemic control for   <7.0% adults with diabetes     CBG: Recent Labs  Lab 05/13/20 0211  GLUCAP 100*    Review of Systems:   Unable to perform review of systems due to patient's mental status & intubation  Past Medical History:  He,  has a past medical history of AKI (acute kidney injury) ([place]) (09/12/2019), CAD (coronary artery disease), Chest pain, CHF (congestive heart failure) ([place]), Chronic pain syndrome, COPD (chronic obstructive pulmonary disease) ([place]), Degenerative cervical disc, Depression, Diabetes mellitus without complication ([place]), Dyslipidemia, GERD (gastroesophageal reflux disease), Hernia (2014), Hypertension, MRSA (methicillin resistant staph aureus) culture positive (2011), Neuropathy, 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    Surgical History:   Past Surgical History:  Procedure Laterality Date  . BACK SURGERY  2012  . CARDIOVERSION N/A 09/26/2019   Procedure: CARDIOVERSION;  Surgeon: Minna Merritts, MD;  Location: [facility] ORS;  Service: Cardiovascular;  Laterality: N/A;  . CHOLECYSTECTOMY    . COLONOSCOPY  Jan 2014   [name]  . CORONARY ANGIOPLASTY WITH STENT PLACEMENT  2009   stents x2, in [place], [place]  . FOOT SURGERY     Right  . NECK SURGERY    . SPINE SURGERY  2012,2013  . TONSILLECTOMY       Social History:   reports that he has been smoking cigarettes. He has a 50.00 pack-year smoking history. He has never used smokeless tobacco. He reports that he does not drink alcohol and does not use drugs.   Family History:  His Family history is unknown by patient.   Allergies Allergies  Allergen Reactions  . Acetaminophen Other (See Comments)    Reaction:  Unknown  Kidney failure Told not to take from home M.D. Related to kidney and renal failure   . Fentanyl   . Nsaids Other (See Comments)    Reaction:  Unknown  Kidney failure Patient states not to take from home M.D. Related to kidney and renal failure.  Other reaction(s): Unknown     Home Medications  Prior to Admission medications   Medication Sig Start Date End Date Taking? Authorizing Provider  alfuzosin (UROXATRAL) 10 MG 24 hr tablet Take 1 tablet (10 mg total) by mouth daily with breakfast. 04/29/20   Billey Co, MD  amiodarone (PACERONE) 200 MG tablet Take 1 tablet (200 mg total) by mouth daily. 11/03/19   Marrianne Mood D, PA-C  atorvastatin (LIPITOR) 40 MG tablet Take 1 tablet (40 mg total) by mouth daily. 09/28/19   Dwyane Dee, MD  bisacodyl (DULCOLAX) 5 MG EC tablet Take by mouth.    [provider]  budesonide (PULMICORT) 0.5 MG/2ML nebulizer solution Take 0.5 mg by nebulization 2 (two) times daily.    [provider]  butalbital-acetaminophen-caffeine (FIORICET) [phone] MG tablet Take by  mouth. 04/02/20   [provider]  Cholecalciferol (VITAMIN D3) 50 MCG (2000 UT) TABS Take 2,000 Units by mouth daily.     [provider]  doxycycline (VIBRAMYCIN) 100 MG capsule Take 100 mg by mouth 2 (two) times daily. 04/21/20   [provider]  DULoxetine (CYMBALTA) 60 MG capsule Take 60 mg by mouth daily.  [provider]  Ensure Max Protein (ENSURE MAX PROTEIN) LIQD Take 330 mLs (11 oz total) by mouth 2 (two) times daily between meals. 06/14/19   Sheikh, Omair Latif, DO  fexofenadine (ALLEGRA) 180 MG tablet Take 180 mg by mouth daily. 01/23/20   [provider]  fluticasone (FLONASE) 50 MCG/ACT nasal spray Place 1 spray into both nostrils 2 (two) times daily. 12/15/19   Ezekiel Slocumb, DO  Fluticasone-Umeclidin-Vilant (TRELEGY ELLIPTA) 100-62.5-25 MCG/INH AEPB Inhale 1 puff into the lungs daily.  10/20/19   [provider]  Fremanezumab-vfrm 225 MG/1.5ML SOAJ Inject into the skin. 04/02/20   [provider]  Galcanezumab-gnlm (EMGALITY, 300 MG DOSE,) 100 MG/ML SOSY Inject into the skin. 02/20/20   [provider]  glyBURIDE (DIABETA) 5 MG tablet Take by mouth at bedtime. 04/17/20   [provider]  glycopyrrolate (ROBINUL) 1 MG tablet Take 1 tablet (1 mg total) by mouth 3 (three) times daily. 02/21/20   Lorella Nimrod, MD  guaiFENesin (MUCINEX) 600 MG 12 hr tablet Take 1 tablet (600 mg total) by mouth 2 (two) times daily. 02/21/20   Lorella Nimrod, MD  insulin lispro (HUMALOG) 100 UNIT/ML injection Inject 4 Units into the skin See admin instructions. Inject 4 units with blood sugar 150-250; inject 6 units 251-350; greater than 351 call provider; Hold for FSBG <150    [provider]  ipratropium-albuterol (DUONEB) 0.5-2.5 (3) MG/3ML SOLN Take 3 mLs by nebulization every 6 (six) hours as needed. Patient taking differently: Take 3 mLs by nebulization every 6 (six) hours as needed (wheezing). 06/14/19   Raiford Noble  Latif, DO  lactulose (CHRONULAC) 10 GM/15ML solution Take 30 mLs by mouth daily as needed. 03/31/20   [provider]  lamoTRIgine (LAMICTAL) 100 MG tablet Take 100 mg by mouth daily.    [provider]  LANTUS SOLOSTAR 100 UNIT/ML Solostar Pen Inject into the skin. 03/18/20   [provider]  magnesium oxide (MAG-OX) 400 (241.3 Mg) MG tablet Take 1 tablet by mouth daily. 12/24/19   [provider]  Menthol-Methyl Salicylate (SALONPAS PAIN RELIEF PATCH EX) Apply 1 patch topically every 12 (twelve) hours as needed.    [provider]  midodrine (PROAMATINE) 10 MG tablet Take 1 tablet (10 mg total) by mouth 2 (two) times daily. Take if top blood pressure reading is less than 100 02/16/20   Dunn, Ryan M, PA-C  NAC 600 MG CAPS Take 600 mg by mouth daily. 09/08/19   [provider]  nicotine (NICODERM CQ - DOSED IN MG/24 HOURS) 21 mg/24hr patch Place 21 mg onto the skin daily. 03/11/20   [provider]  NITROSTAT 0.4 MG SL tablet DISSOLVE (1) TABLET UNDER TONGUE AS NEEDED TO RELIEVE CHEST PAIN. MAYREPEAT EVERY 5 MINUTES. Patient taking differently: Place 0.4 mg under the tongue every 5 (five) minutes as needed for chest pain. 10/06/15   Wellington Hampshire, MD  ondansetron (ZOFRAN-ODT) 4 MG disintegrating tablet Take 1 tablet (4 mg total) by mouth every 8 (eight) hours as needed for nausea or vomiting. 02/21/20   Lorella Nimrod, MD  Oxycodone HCl 10 MG TABS Take 1 tablet (10 mg total) by mouth 3 (three) times daily. 04/09/20   Caren Griffins, MD  oxymetazoline (AFRIN) 0.05 % nasal spray Place 2 sprays into both nostrils 2 (two) times daily as needed for congestion.    [provider]  pantoprazole (PROTONIX) 40 MG tablet Take 40 mg by mouth daily.  [provider]  Potassium Chloride ER 20 MEQ TBCR Take 10 mEq by mouth daily. 04/27/20   Dunn, Areta Haber, PA-C  potassium chloride SA (KLOR-CON) 20 MEQ tablet Take 20 mEq by mouth daily.  04/17/20   [provider]  potassium citrate (UROCIT-K) 10 MEQ (1080 MG) SR tablet Take 10 mEq by mouth daily. 04/28/20   [provider]  predniSONE (DELTASONE) 2.5 MG tablet Take 2.5 mg by mouth daily. 04/17/20   [provider]  pregabalin (LYRICA) 200 MG capsule Take 1 capsule (200 mg total) by mouth 3 (three) times daily. 09/14/19   Danford, Suann Larry, MD  rivaroxaban (XARELTO) 20 MG TABS tablet Take 20 mg by mouth daily with supper.    [provider]  roflumilast (DALIRESP) 500 MCG TABS tablet Take 0.5 tablets (250 mcg total) by mouth daily. 02/21/20   Lorella Nimrod, MD  senna-docusate (SENOKOT-S) 8.6-50 MG tablet Take 1 tablet by mouth daily.     [provider]  sodium chloride (OCEAN) 0.65 % SOLN nasal spray Place 1 spray into both nostrils as needed for congestion. 12/15/19   Ezekiel Slocumb, DO  sulfamethoxazole-trimethoprim (BACTRIM) 400-80 MG tablet Take 1 tablet by mouth daily. 04/22/20   [provider]  torsemide (DEMADEX) 20 MG tablet Take 1 tablet (20 mg total) by mouth daily. 04/27/20 07/26/20  Rise Mu, PA-C  traZODone (DESYREL) 150 MG tablet Take 150 mg by mouth at bedtime. 01/23/20   [provider]  Ubrogepant (UBRELVY) 100 MG TABS Take 100 mg by mouth daily. 02/22/20   Lorella Nimrod, MD  varenicline (CHANTIX) 1 MG tablet Take 1 mg by mouth 2 (two) times daily.    [provider]     Critical care time: 40 minutes    Darel Hong, AGACNP-BC [facility] epic messenger for cross cover needs If after hours, please call E-link

## 2020-05-13 NOTE — Progress Notes (Signed)
Preparing to administer meds via OG, unable to auscultate placement. Reviewed chart,  no xray confirmation seen. OG tube advanced from 50cm to 60cm at the lip. At 60cm at the lip was able to auscultate placement. KUB being ordered for xray confirmation.

## 2020-05-13 NOTE — ED Notes (Signed)
Pt transported to ICU on monitor with RN and RT. Pt tolerated well, however became agitated with eyes intermittently opening and extremities moving when getting off the elevator.

## 2020-05-13 NOTE — Progress Notes (Signed)
Upon arrival from ED pt was restless. Precedex increased. After patient settled in, pt rest well all night. VSS. Pt HR 58-low 60s. Pt able to follow commands.

## 2020-05-14 ENCOUNTER — Ambulatory Visit: Payer: Medicare Other | Admitting: Family

## 2020-05-14 DIAGNOSIS — J189 Pneumonia, unspecified organism: Secondary | ICD-10-CM

## 2020-05-14 LAB — CBC
HCT: 36.6 % — ABNORMAL LOW (ref 39.0–52.0)
Hemoglobin: 11.1 g/dL — ABNORMAL LOW (ref 13.0–17.0)
MCH: 24.6 pg — ABNORMAL LOW (ref 26.0–34.0)
MCHC: 30.3 g/dL (ref 30.0–36.0)
MCV: 81 fL (ref 80.0–100.0)
Platelets: 254 10*3/uL (ref 150–400)
RBC: 4.52 MIL/uL (ref 4.22–5.81)
RDW: 23.8 % — ABNORMAL HIGH (ref 11.5–15.5)
WBC: 10.1 10*3/uL (ref 4.0–10.5)
nRBC: 0 % (ref 0.0–0.2)

## 2020-05-14 LAB — BASIC METABOLIC PANEL
Anion gap: 11 (ref 5–15)
BUN: 21 mg/dL (ref 8–23)
CO2: 37 mmol/L — ABNORMAL HIGH (ref 22–32)
Calcium: 8.3 mg/dL — ABNORMAL LOW (ref 8.9–10.3)
Chloride: 92 mmol/L — ABNORMAL LOW (ref 98–111)
Creatinine, Ser: 1.11 mg/dL (ref 0.61–1.24)
GFR, Estimated: >60 mL/min (ref >60)
Glucose, Bld: 82 mg/dL (ref 70–99)
Potassium: 3.2 mmol/L — ABNORMAL LOW (ref 3.5–5.1)
Sodium: 140 mmol/L (ref 135–145)

## 2020-05-14 LAB — GLUCOSE, CAPILLARY
Glucose-Capillary: 207 mg/dL — ABNORMAL HIGH (ref 70–99)
Glucose-Capillary: 217 mg/dL — ABNORMAL HIGH (ref 70–99)

## 2020-05-14 LAB — MAGNESIUM: Magnesium: 2.1 mg/dL (ref 1.7–2.4)

## 2020-05-14 LAB — PHOSPHORUS: Phosphorus: 2.6 mg/dL (ref 2.5–4.6)

## 2020-05-14 MED ORDER — DULOXETINE HCL 30 MG PO CPEP
60.0000 mg | ORAL_CAPSULE | Freq: Every day | ORAL | Status: DC
  Administered 2020-05-15 – 2020-05-18 (×4): 60 mg via ORAL
  Filled 2020-05-14 (×4): qty 2

## 2020-05-14 MED ORDER — POTASSIUM CHLORIDE 10 MEQ/100ML IV SOLN
10.0000 meq | INTRAVENOUS | Status: AC
  Administered 2020-05-14 (×4): 10 meq via INTRAVENOUS
  Filled 2020-05-14 (×4): qty 100

## 2020-05-14 MED ORDER — ALBUTEROL SULFATE (2.5 MG/3ML) 0.083% IN NEBU
2.5000 mg | INHALATION_SOLUTION | RESPIRATORY_TRACT | Status: DC | PRN
  Administered 2020-05-15: 2.5 mg via RESPIRATORY_TRACT
  Filled 2020-05-14: qty 3

## 2020-05-14 MED ORDER — PREDNISONE 5 MG/5ML PO SOLN: 40.0000 mg | Freq: Every day | ORAL | Status: DC

## 2020-05-14 MED ORDER — PREGABALIN 75 MG PO CAPS
200.0000 mg | ORAL_CAPSULE | Freq: Three times a day (TID) | ORAL | Status: DC
  Administered 2020-05-14 – 2020-05-18 (×14): 200 mg via ORAL
  Filled 2020-05-14 (×9): qty 1
  Filled 2020-05-14: qty 2
  Filled 2020-05-14 (×6): qty 1

## 2020-05-14 MED ORDER — TORSEMIDE 20 MG PO TABS
40.0000 mg | ORAL_TABLET | Freq: Every day | ORAL | Status: DC
  Administered 2020-05-14 – 2020-05-18 (×5): 40 mg via ORAL
  Filled 2020-05-14 (×5): qty 2

## 2020-05-14 MED ORDER — HYDRALAZINE HCL 20 MG/ML IJ SOLN: 20.0000 mg | Freq: Four times a day (QID) | INTRAMUSCULAR | Status: DC | PRN

## 2020-05-14 MED ORDER — OXYCODONE HCL 5 MG PO TABS
10.0000 mg | ORAL_TABLET | Freq: Four times a day (QID) | ORAL | Status: DC | PRN
  Administered 2020-05-14 – 2020-05-18 (×17): 10 mg via ORAL
  Filled 2020-05-14 (×17): qty 2

## 2020-05-14 MED ORDER — PREDNISONE 20 MG PO TABS
40.0000 mg | ORAL_TABLET | Freq: Every day | ORAL | Status: AC
  Administered 2020-05-14 – 2020-05-16 (×3): 40 mg via ORAL
  Filled 2020-05-14: qty 4
  Filled 2020-05-14 (×2): qty 2

## 2020-05-14 MED ORDER — INSULIN ASPART 100 UNIT/ML IJ SOLN
0.0000 [IU] | Freq: Three times a day (TID) | INTRAMUSCULAR | Status: DC
  Administered 2020-05-15: 3 [IU] via SUBCUTANEOUS
  Administered 2020-05-15: 5 [IU] via SUBCUTANEOUS
  Administered 2020-05-16 (×2): 3 [IU] via SUBCUTANEOUS
  Administered 2020-05-16 – 2020-05-17 (×3): 2 [IU] via SUBCUTANEOUS
  Administered 2020-05-18: 1 [IU] via SUBCUTANEOUS
  Filled 2020-05-14 (×9): qty 1

## 2020-05-14 MED ORDER — TRAZODONE HCL 50 MG PO TABS
25.0000 mg | ORAL_TABLET | Freq: Every day | ORAL | Status: DC
  Administered 2020-05-14 – 2020-05-17 (×4): 25 mg via ORAL
  Filled 2020-05-14 (×4): qty 1

## 2020-05-14 MED ORDER — SODIUM CHLORIDE 0.9% FLUSH
10.0000 mL | Freq: Two times a day (BID) | INTRAVENOUS | Status: DC
  Administered 2020-05-14 – 2020-05-18 (×7): 10 mL via INTRAVENOUS

## 2020-05-14 MED ORDER — IPRATROPIUM-ALBUTEROL 0.5-2.5 (3) MG/3ML IN SOLN
3.0000 mL | Freq: Four times a day (QID) | RESPIRATORY_TRACT | Status: DC
  Administered 2020-05-14 – 2020-05-16 (×4): 3 mL via RESPIRATORY_TRACT
  Filled 2020-05-14 (×7): qty 3

## 2020-05-14 MED ORDER — NICOTINE 14 MG/24HR TD PT24
14.0000 mg | MEDICATED_PATCH | Freq: Every day | TRANSDERMAL | Status: DC
  Administered 2020-05-14 – 2020-05-18 (×5): 14 mg via TRANSDERMAL
  Filled 2020-05-14 (×5): qty 1

## 2020-05-14 MED ORDER — POTASSIUM CHLORIDE CRYS ER 20 MEQ PO TBCR
20.0000 meq | EXTENDED_RELEASE_TABLET | ORAL | Status: AC
  Administered 2020-05-14 (×2): 20 meq via ORAL
  Filled 2020-05-14 (×2): qty 1

## 2020-05-14 MED ORDER — PANTOPRAZOLE SODIUM 40 MG PO TBEC
40.0000 mg | DELAYED_RELEASE_TABLET | Freq: Every day | ORAL | Status: DC
  Administered 2020-05-14 – 2020-05-17 (×4): 40 mg via ORAL
  Filled 2020-05-14 (×4): qty 1

## 2020-05-14 NOTE — Progress Notes (Addendum)
PROGRESS NOTE    Gregory Hall  VQQ:[id number] DOB: [birth year] DOA: 05/12/2020 PCP: Koren Bound, NP  Assessment & Plan:   Active Problems:   Hypercapnic respiratory failure (HCC)  Acute hypercapnic & hypoxic respiratory failure: likely secondary to polysubstance abuse. S/p intubation & extubation. Continue on supplemental oxygen and wean as tolerated. Encourage incentive spirometry and continue on bronchodilators   Acute toxic encephalopathy: likely secondary to polysubstance abuse. Urine drug screen was positive for barbiturates. Re-orient prn  Likely pneumonia: continue on doxycyline, steroids & bronchodilators. Encourage incentive spirometry   Chronic diastolic CHF: continue on home dose of torsemide. Not on BB as per med rec. Monitor I/Os  DM2: likely poorly controlled. Continue on SSI w/ accuchecks  Hypokalemia: KCl repleated. Will continue to monitor     DVT prophylaxis: heparin  Code Status: full  Family Communication:  Disposition Plan: unclear as pt previous facility refused to take the pt back. PT/OT consulted  Level of care: Stepdown   Status is: Inpatient  Remains inpatient appropriate because:Unsafe d/c plan, IV treatments appropriate due to intensity of illness or inability to take PO and Inpatient level of care appropriate due to severity of illness   Dispo: The patient is from: group home              Anticipated d/c is to: SNF vs group home               Patient currently is not medically stable to d/c.   Difficult to place patient Yes    Consultants:   ICU    Procedures:    Antimicrobials: doxycycline    Subjective: Pt c/o neck, back & leg pain. Pt has chronic pain   Objective: Vitals:   05/14/20 0400 05/14/20 0500 05/14/20 0600 05/14/20 0745  BP: 126/65 (!) 120/94 116/68   Pulse: 76 77 76 73  Resp: (!) 9 13 18 12   Temp: 99 F (37.2 C) 99 F (37.2 C) 98.8 F (37.1 C)   TempSrc:      SpO2: 90% 93% 91% 92%  Weight:  96.9  kg    Height:        Intake/Output Summary (Last 24 hours) at 05/14/2020 0819 Last data filed at 05/14/2020 0600 Gross per 24 hour  Intake 93.68 ml  Output 3800 ml  Net -3706.32 ml   Filed Weights   05/12/20 2157 05/13/20 0220 05/14/20 0500  Weight: 96 kg 98.3 kg 96.9 kg    Examination:  General exam: Appears uncomfortable  Respiratory system: Clear to auscultation. Respiratory effort normal. Cardiovascular system: S1 & S2 +. No  rubs, gallops or clicks.  Gastrointestinal system: Abdomen is nondistended, soft and nontender. Normal bowel sounds heard. Central nervous system: Alert and oriented. Moves all extremities  Psychiatry: Judgement and insight appear normal. Flat mood and affect     Data Reviewed: I have personally reviewed following labs and imaging studies  CBC: Recent Labs  Lab 05/12/20 2200 05/13/20 0432 05/14/20 0353  WBC 7.3 7.2 10.1  HGB 11.1* 11.1* 11.1*  HCT 37.6* 36.9* 36.6*  MCV 86.0 84.2 81.0  PLT 233 222 254   Basic Metabolic Panel: Recent Labs  Lab 05/12/20 2200 05/13/20 0432 05/14/20 0353  NA 138 136 140  K 3.9 3.7 3.2*  CL 91* 88* 92*  CO2 36* 37* 37*  GLUCOSE 92 139* 82  BUN 20 20 21   CREATININE 1.30* 1.47* 1.11  CALCIUM 8.5* 8.2* 8.3*  MG  --  2.0 2.1  PHOS  --   --  2.6   GFR: Estimated Creatinine Clearance: 72.9 mL/min (by C-G formula based on SCr of 1.11 mg/dL). Liver Function Tests: No results for input(s): AST, ALT, ALKPHOS, BILITOT, PROT, ALBUMIN in the last 168 hours. No results for input(s): LIPASE, AMYLASE in the last 168 hours. No results for input(s): AMMONIA in the last 168 hours. Coagulation Profile: Recent Labs  Lab 05/12/20 2200  INR 1.0   Cardiac Enzymes: No results for input(s): CKTOTAL, CKMB, CKMBINDEX, TROPONINI in the last 168 hours. BNP (last 3 results) No results for input(s): PROBNP in the last 8760 hours. HbA1C: No results for input(s): HGBA1C in the last 72 hours. CBG: Recent Labs  Lab  05/13/20 0211  GLUCAP 100*   Lipid Profile: No results for input(s): CHOL, HDL, LDLCALC, TRIG, CHOLHDL, LDLDIRECT in the last 72 hours. Thyroid Function Tests: No results for input(s): TSH, T4TOTAL, FREET4, T3FREE, THYROIDAB in the last 72 hours. Anemia Panel: No results for input(s): VITAMINB12, FOLATE, FERRITIN, TIBC, IRON, RETICCTPCT in the last 72 hours. Sepsis Labs: Recent Labs  Lab 05/12/20 2200  PROCALCITON 0.10    Recent Results (from the past 240 hour(s))  Resp Panel by RT-PCR (Flu A&B, Covid) Nasopharyngeal Swab     Status: None   Collection Time: 05/12/20 10:29 PM   Specimen: Nasopharyngeal Swab; Nasopharyngeal(NP) swabs in vial transport medium  Result Value Ref Range Status   SARS Coronavirus 2 by RT PCR NEGATIVE NEGATIVE Final    Comment: (NOTE) SARS-CoV-2 target nucleic acids are NOT DETECTED.  The SARS-CoV-2 RNA is generally detectable in upper respiratory specimens during the acute phase of infection. The lowest concentration of SARS-CoV-2 viral copies this assay can detect is 138 copies/mL. A negative result does not preclude SARS-Cov-2 infection and should not be used as the sole basis for treatment or other patient management decisions. A negative result may occur with  improper specimen collection/handling, submission of specimen other than nasopharyngeal swab, presence of viral mutation(s) within the areas targeted by this assay, and inadequate number of viral copies(<138 copies/mL). A negative result must be combined with clinical observations, patient history, and epidemiological information. The expected result is Negative.  Fact Sheet for Patients:  BloggerCourse.com  Fact Sheet for Healthcare Providers:  SeriousBroker.it  This test is no t yet approved or cleared by the [place] FDA and  has been authorized for detection and/or diagnosis of SARS-CoV-2 by FDA under an Emergency Use  Authorization (EUA). This EUA will remain  in effect (meaning this test can be used) for the duration of the COVID-19 declaration under Section 564(b)(1) of the Act, 21 U.S.C.section 360bbb-3(b)(1), unless the authorization is terminated  or revoked sooner.       Influenza A by PCR NEGATIVE NEGATIVE Final   Influenza B by PCR NEGATIVE NEGATIVE Final    Comment: (NOTE) The Xpert Xpress SARS-CoV-2/FLU/RSV plus assay is intended as an aid in the diagnosis of influenza from Nasopharyngeal swab specimens and should not be used as a sole basis for treatment. Nasal washings and aspirates are unacceptable for Xpert Xpress SARS-CoV-2/FLU/RSV testing.  Fact Sheet for Patients: BloggerCourse.com  Fact Sheet for Healthcare Providers: SeriousBroker.it  This test is not yet approved or cleared by the [place] FDA and has been authorized for detection and/or diagnosis of SARS-CoV-2 by FDA under an Emergency Use Authorization (EUA). This EUA will remain in effect (meaning this test can be used) for the duration of the COVID-19 declaration under Section 564(b)(1) of the Act, 21 U.S.C. section 360bbb-3(b)(1), unless the  authorization is terminated or revoked.  Performed at [facility], [address] [place], [place] [address]   MRSA PCR Screening     Status: None   Collection Time: 05/13/20  2:46 AM   Specimen: Nasopharyngeal  Result Value Ref Range Status   MRSA by PCR NEGATIVE NEGATIVE Final    Comment:        The GeneXpert MRSA Assay (FDA approved for NASAL specimens only), is one component of a comprehensive MRSA colonization surveillance program. It is not intended to diagnose MRSA infection nor to guide or monitor treatment for MRSA infections. Performed at [facility], [address] [place], [place] [address]          Radiology Studies: DG Chest 1 View  Result Date: 05/12/2020 CLINICAL  DATA:  Hypoxia, COPD, CHF EXAM: CHEST  1 VIEW COMPARISON:  04/07/2020 FINDINGS: Single frontal view of the chest demonstrates a stable cardiac silhouette. Chronic vascular congestion, with patchy bibasilar consolidation favor atelectasis or scarring. No airspace disease, effusion, or pneumothorax. No acute bony abnormalities. IMPRESSION: 1. Patchy bibasilar consolidation compatible with scarring or atelectasis. 2. No acute airspace disease. Electronically Signed   By: Sharlet Salina M.D.   On: 05/12/2020 22:23   DG Abd 1 View  Result Date: 05/13/2020 CLINICAL DATA:  Orogastric tube placement EXAM: ABDOMEN - 1 VIEW COMPARISON:  02/17/2020 abdominal CT FINDINGS: Enteric tube with tip at the upper stomach and side-port likely at the gastric cardia. Limited coverage of the abdominal bowel gas without obstructive pattern. Lower chest densities as seen on x-ray yesterday. IMPRESSION: Enteric tube with tip at the upper stomach. Electronically Signed   By: Marnee Spring M.D.   On: 05/13/2020 04:48   DG Chest Portable 1 View  Result Date: 05/12/2020 CLINICAL DATA:  Intubation EXAM: PORTABLE CHEST 1 VIEW COMPARISON:  05/12/2020 FINDINGS: Support Apparatus: --Endotracheal tube: Tip at the level of the clavicular heads. --Enteric tube:Tip and sideport project over the stomach. --Catheter(s):None --Other: None Unchanged bibasilar atelectasis. IMPRESSION: Endotracheal tube tip at the level of the clavicular heads. Electronically Signed   By: Deatra Robinson M.D.   On: 05/12/2020 23:54        Scheduled Meds: . arformoterol  15 mcg Nebulization BID  . budesonide (PULMICORT) nebulizer solution  0.5 mg Nebulization BID  . buprenorphine-naloxone  2 tablet Sublingual BID  . chlorhexidine  15 mL Mouth Rinse BID  . Chlorhexidine Gluconate Cloth  6 each Topical Daily  . heparin  5,000 Units Subcutaneous Q8H  . mouth rinse  15 mL Mouth Rinse q12n4p  . pantoprazole (PROTONIX) IV  40 mg Intravenous QHS  . potassium  chloride  20 mEq Oral Q4H  . predniSONE  40 mg Per Tube Q breakfast  . revefenacin  175 mcg Nebulization Daily   Continuous Infusions: . doxycycline (VIBRAMYCIN) IV 100 mg (05/14/20 0246)  . potassium chloride 10 mEq (05/14/20 0524)     LOS: 1 day    Time spent: 33 mins     Charise Killian, MD [facility] Pager [phone]  If 7PM-7AM, please contact night-coverage 05/14/2020, 8:19 AM

## 2020-05-14 NOTE — Progress Notes (Addendum)
PHARMACY CONSULT NOTE - FOLLOW UP  Pharmacy Consult for Electrolyte Monitoring and Replacement   Recent Labs: Potassium (mmol/L)  Date Value  05/14/2020 3.2 (L)  03/31/2013 3.6   Magnesium (mg/dL)  Date Value  25/95/6387 2.1  06/25/2012 2.4   Calcium (mg/dL)  Date Value  56/43/3295 8.3 (L)   Calcium, Total (mg/dL)  Date Value  18/84/1660 7.6 (L)   Albumin (g/dL)  Date Value  63/01/6008 2.7 (L)  11/03/2019 4.6  03/30/2013 3.2 (L)   Phosphorus (mg/dL)  Date Value  93/23/5573 2.6  03/20/2012 3.4   Sodium (mmol/L)  Date Value  05/14/2020 140  11/03/2019 141  03/31/2013 142     Assessment: 65 year old male with CAD, COPD, HFpEF, and DMII who presents from [facility] facility with SpO2 of 70% on room air. He was treated with duoneb by EMS and placed on non-rebreather mask with improvement in his SpO2. He was placed on Bipap upon arrival to the ER. Patient was intubated 5/4 then extubated 5/5. Pharmacy has been consulted for electrolyte management.   Goal of Therapy:  Electrolytes WNL  Plan:   K 3.2 - MD replaced with IV KCl x4 runs and PO KCl x2 doses  Patient being transferred from ICU to cardiac progressive floor. Pharmacy will sign off for now.   Raiford Noble, PharmD Pharmacy Resident  05/14/2020 6:12 AM

## 2020-05-14 NOTE — TOC Initial Note (Signed)
Transition of Care [facility]) - Initial/Assessment Note    Patient Details  Name: Gregory Hall MRN: [id number] Date of Birth: June 15, 1955  [facility] CM/SW Contact:    Chapman Fitch, RN Phone Number: 05/14/2020, 4:15 PM  Clinical Narrative:                 Patient admitted from   Contact info and address:  [phone]  [facility]  [address] [place], [place] [address]    Unable to reach group home at this time.  Patient admitted with AMS  Per chart review has O2 and WC at the group home.  May also have a trilogy Patient open with [facility] for RN and PT.  Barbara Cower with [facility] notified of admission  Prior to discharge will need to contact group home, update FL2,  Previous discharge patient required non emergent EMS transport   Expected Discharge Plan: Group Home (with home health)     Patient Goals and CMS Choice        Expected Discharge Plan and Services Expected Discharge Plan: Group Home (with home health)       Living arrangements for the past 2 months: Group Home                           HH Arranged: RN,PT,OT HH Agency: [facility] Date HH Agency Contacted: 05/14/20   Representative spoke with at [facility] Agency: Barbara Cower  Prior Living Arrangements/Services Living arrangements for the past 2 months: Group Home Lives with:: Facility Resident              Current home services: DME    Activities of Daily Living      Permission Sought/Granted                  Emotional Assessment              Admission diagnosis:  SOB (shortness of breath) [R06.02] Acute respiratory failure with hypercapnia (HCC) [J96.02] Hypercapnic respiratory failure (HCC) [J96.92] Patient Active Problem List   Diagnosis Date Noted  . Hypercapnic respiratory failure (HCC) 05/13/2020  . Altered mental status 04/08/2020  . AMS (altered mental status)  04/08/2020  . Prolonged QT interval 04/08/2020  . Acute metabolic encephalopathy 04/08/2020  . Continuous tobacco abuse 03/20/2020  . AKI (acute kidney injury) (HCC) 02/17/2020  . Diabetes mellitus without complication (HCC)   . Diastolic dysfunction with chronic heart failure (HCC)   . Near syncope   . Acute respiratory failure (HCC) 12/13/2019  . Palliative care by specialist   . DNR (do not resuscitate)   . Acute CHF (congestive heart failure) (HCC) 10/08/2019  . Chronic respiratory failure with hypoxia (HCC) 10/08/2019  . Atrial flutter (HCC)   . Acute on chronic respiratory failure with hypoxia (HCC) 09/13/2019  . Acute diastolic CHF (congestive heart failure) (HCC) 09/12/2019  . NSTEMI (non-ST elevated myocardial infarction) (HCC) 09/12/2019  . Acute kidney failure, unspecified (HCC) 09/12/2019  . Diabetes mellitus type 2, uncontrolled, with complications (HCC) 06/10/2019  . Chronic pain syndrome 06/10/2019  . COPD exacerbation (HCC) 06/10/2019  . COPD with acute exacerbation (HCC) 06/09/2019  . CAD (coronary artery disease) 06/09/2019  . HTN (hypertension) 06/09/2019  . Acute on chronic respiratory failure with hypoxia and hypercapnia (HCC) 06/09/2019  . Anxiety 06/09/2019  . Chronic prescription opiate use 06/09/2019  . Hyperglycemia 06/09/2019  .  Subclavian artery stenosis, left (HCC) 08/20/2014  . Rectus diastasis 07/19/2012  . Hernia   . Edema 05/10/2011  . Goals of care, counseling/discussion 10/06/2010  . SMOKER 11/25/2009  . CAROTID BRUIT, RIGHT 11/24/2009  . Chest pain 11/24/2009  . Hyperlipidemia 03/30/2009  . Coronary atherosclerosis 03/30/2009  . HYPERTENSION, HX OF 03/30/2009   PCP:  Koren Bound, NP Pharmacy:   [facility] - [place], [place] - [address] [place] [address] Phone: [phone] Fax: [phone]     Social Determinants of Health (SDOH) Interventions    Readmission Risk  Interventions Readmission Risk Prevention Plan 02/18/2020 12/15/2019 09/24/2019  Transportation Screening Complete Complete Complete  PCP or Specialist Appt within 3-5 Days - - -  HRI or Home Care Consult - - -  Social Work Consult for Recovery Care Planning/Counseling - - -  Palliative Care Screening - - -  Medication Review Oceanographer) (No Data) Complete Complete  PCP or Specialist appointment within 3-5 days of discharge Complete - Complete  HRI or Home Care Consult Complete Complete Complete  SW Recovery Care/Counseling Consult Complete - -  Palliative Care Screening Not Applicable Not Applicable Complete  Skilled Nursing Facility Not Applicable Not Applicable Complete  Some recent data might be hidden

## 2020-05-14 NOTE — Evaluation (Signed)
Physical Therapy Evaluation Patient Details Name: Gregory Hall MRN: [id number] DOB: 08/30/1955 Today's Date: 05/14/2020   History of Present Illness  presented to ER secondary to AMS, hypoxia; admitted for acute hypercapnic/hypoexemic respiratory failure.  Intubated 5/4-5/5, now weaned to 4L supplemental O2 (baseline levels)  Clinical Impression  Upon evaluation, patient alert and oriented; follows commands and agreeable to session with min cuing from therapist.  Endorses chronic back/neck pain; denies acute pain at this time.  Bilat UE/LE strength and ROM grossly symmetrical and WFL, no focal weakness appreciated.  Able to complete bed mobility with mod indep; sit/stand, basic transfers and gait (30') with RW, cga/min assist.  Demonstrates short, shuffling steps with poor heel strike/toe off (nearly foot flat throughout gait cycle); poor balance reactions, limited activity tolerance.  Sats 90% on 4L with gait trial. Would benefit from skilled PT to address above deficits and promote optimal return to PLOF.; Recommend transition to HHPT upon discharge from acute hospitalization.     Follow Up Recommendations Home health PT    Equipment Recommendations       Recommendations for Other Services       Precautions / Restrictions Precautions Precautions: Fall Restrictions Weight Bearing Restrictions: No      Mobility  Bed Mobility Overal bed mobility: Modified Independent                  Transfers Overall transfer level: Needs assistance Equipment used: Rolling walker (2 wheeled) Transfers: Sit to/from Stand Sit to Stand: Min guard         General transfer comment: cuing for hand placement  Ambulation/Gait Ambulation/Gait assistance: Min guard Gait Distance (Feet): 30 Feet Assistive device: Rolling walker (2 wheeled)       General Gait Details: short, shuffling steps with poor heel strike/toe off (nearly foot flat throughout gait cycle); poor balance  reactions, limited activity tolerance.  Sats 90% on 4L with gait trial.  Stairs            Wheelchair Mobility    Modified Rankin (Stroke Patients Only)       Balance Overall balance assessment: Needs assistance Sitting-balance support: No upper extremity supported;Feet supported Sitting balance-Leahy Scale: Good     Standing balance support: Bilateral upper extremity supported Standing balance-Leahy Scale: Fair                               Pertinent Vitals/Pain Pain Assessment: Faces Faces Pain Scale: Hurts little more Pain Location: chronic back Pain Descriptors / Indicators: Aching;Guarding;Grimacing Pain Intervention(s): Limited activity within patient's tolerance;Monitored during session;Repositioned    Home Living Family/patient expects to be discharged to:: Group home               Home Equipment: Hand held shower head;Grab bars - tub/shower;Grab bars - toilet;Wheelchair - manual Additional Comments: ramp to enter; 1 level    Prior Function Level of Independence: Needs assistance   Gait / Transfers Assistance Needed: Modified independent w/c level functional mobility.  Receiving HHPT 2x/week working on walking with RW and strengthening.  ADL's / Homemaking Assistance Needed: Staff set-up for sponge baths; assist for meals and medications.        Hand Dominance   Dominant Hand: Right    Extremity/Trunk Assessment   Upper Extremity Assessment Upper Extremity Assessment: Overall WFL for tasks assessed    Lower Extremity Assessment Lower Extremity Assessment: Overall WFL for tasks assessed (grossly 4/5 throughout)  Communication   Communication: No difficulties  Cognition Arousal/Alertness: Awake/alert Behavior During Therapy: WFL for tasks assessed/performed Overall Cognitive Status: Within Functional Limits for tasks assessed                                        General Comments      Exercises      Assessment/Plan    PT Assessment Patient needs continued PT services  PT Problem List Decreased strength;Decreased range of motion;Decreased activity tolerance;Decreased balance;Decreased mobility;Decreased coordination;Decreased knowledge of use of DME;Decreased safety awareness;Decreased knowledge of precautions;Cardiopulmonary status limiting activity       PT Treatment Interventions DME instruction;Gait training;Stair training;Functional mobility training;Therapeutic activities;Therapeutic exercise;Balance training;Patient/family education    PT Goals (Current goals can be found in the Care Plan section)  Acute Rehab PT Goals Patient Stated Goal: to return home PT Goal Formulation: With patient Time For Goal Achievement: 05/28/20 Potential to Achieve Goals: Good    Frequency Min 2X/week   Barriers to discharge        Co-evaluation               AM-PAC PT "6 Clicks" Mobility  Outcome Measure Help needed turning from your back to your side while in a flat bed without using bedrails?: None Help needed moving from lying on your back to sitting on the side of a flat bed without using bedrails?: None Help needed moving to and from a bed to a chair (including a wheelchair)?: A Little Help needed standing up from a chair using your arms (e.g., wheelchair or bedside chair)?: A Little Help needed to walk in hospital room?: A Little Help needed climbing 3-5 steps with a railing? : A Little 6 Click Score: 20    End of Session Equipment Utilized During Treatment: Gait belt;Oxygen Activity Tolerance: Patient tolerated treatment well Patient left: in bed;with call bell/phone within reach;with bed alarm set (bed in chair position) Nurse Communication: Mobility status PT Visit Diagnosis: Muscle weakness (generalized) (M62.81);Difficulty in walking, not elsewhere classified (R26.2)    Time: 5885-0277 PT Time Calculation (min) (ACUTE ONLY): 25 min   Charges:   PT  Evaluation $PT Eval Moderate Complexity: 1 Mod         Heena Woodbury H. Manson Passey, PT, DPT, NCS 05/14/20, 4:01 PM [phone]

## 2020-05-14 NOTE — Progress Notes (Signed)
Removed within 5 minutes of bipap being placed on patient. BIPAP remains at bedside.

## 2020-05-14 NOTE — Evaluation (Signed)
Occupational Therapy Evaluation Patient Details Name: Gregory Hall MRN: [id number] DOB: 1955/07/25 Today's Date: 05/14/2020    History of Present Illness presented to ER secondary to AMS, hypoxia; admitted for acute hypercapnic/hypoexemic respiratory failure.  Intubated 5/4-5/5, now weaned to 4L supplemental O2 (baseline levels)   Clinical Impression   Gregory Hall was seen for OT evaluation this date. Prior to hospital admission, pt was MOD I using w/c. Pt lives at group home, assist for meals/meds and setup for bathes. Pt presents to acute OT demonstrating impaired ADL performance and functional mobility 2/2 decreased activity tolerance and functional strength/ROM/balance deficits. Pt currently requires MOD A don B socks at bed level. CGA + RW for ADL t/f. Pt would benefit from skilled OT to address noted impairments and functional limitations (see below for any additional details) in order to maximize safety and independence while minimizing falls risk and caregiver burden. Upon hospital discharge, recommend HHOT to maximize pt safety and return to functional independence during meaningful occupations of daily life.     Follow Up Recommendations  Home health OT    Equipment Recommendations  None recommended by OT    Recommendations for Other Services       Precautions / Restrictions Precautions Precautions: Fall Restrictions Weight Bearing Restrictions: No      Mobility Bed Mobility Overal bed mobility: Modified Independent                  Transfers Overall transfer level: Needs assistance Equipment used: Rolling walker (2 wheeled) Transfers: Sit to/from Stand Sit to Stand: Min guard         General transfer comment: cuing for hand placement    Balance Overall balance assessment: Needs assistance Sitting-balance support: No upper extremity supported;Feet supported Sitting balance-Leahy Scale: Good     Standing balance support: Bilateral upper  extremity supported Standing balance-Leahy Scale: Fair                             ADL either performed or assessed with clinical judgement   ADL Overall ADL's : Needs assistance/impaired                                       General ADL Comments: MOD A don B socks at bed level. CGA + RW for ADL t/f.                  Pertinent Vitals/Pain Pain Assessment: Faces Faces Pain Scale: Hurts little more Pain Location: chronic back Pain Descriptors / Indicators: Aching;Guarding;Grimacing Pain Intervention(s): Limited activity within patient's tolerance;Repositioned     Hand Dominance Right   Extremity/Trunk Assessment Upper Extremity Assessment Upper Extremity Assessment: Overall WFL for tasks assessed   Lower Extremity Assessment Lower Extremity Assessment: Generalized weakness       Communication Communication Communication: No difficulties   Cognition Arousal/Alertness: Awake/alert Behavior During Therapy: WFL for tasks assessed/performed Overall Cognitive Status: Within Functional Limits for tasks assessed                                     General Comments       Exercises Exercises: Other exercises Other Exercises Other Exercises: Pt educated re: OT role, DME recs, d/c recs, falls prevention, HEP Other Exercises: LBD, sup<>sit, sit<>stand, sitting/standing balance/tolerance  Shoulder Instructions      Home Living Family/patient expects to be discharged to:: Group home                             Home Equipment: Hand held shower head;Grab bars - tub/shower;Grab bars - toilet;Wheelchair - manual   Additional Comments: ramp to enter; 1 level      Prior Functioning/Environment Level of Independence: Needs assistance  Gait / Transfers Assistance Needed: Modified independent w/c level functional mobility.  Receiving HHPT 2x/week working on walking with RW and strengthening. ADL's / Homemaking  Assistance Needed: Staff set-up for sponge baths; assist for meals and medications.            OT Problem List: Decreased strength;Decreased range of motion;Decreased activity tolerance;Impaired balance (sitting and/or standing)      OT Treatment/Interventions: Self-care/ADL training;Therapeutic exercise;Energy conservation;DME and/or AE instruction;Therapeutic activities;Patient/family education;Balance training    OT Goals(Current goals can be found in the care plan section) Acute Rehab OT Goals Patient Stated Goal: to return home OT Goal Formulation: With patient Time For Goal Achievement: 05/28/20 Potential to Achieve Goals: Good ADL Goals Pt Will Perform Grooming: with modified independence;standing (c LRAD PRN) Pt Will Perform Lower Body Dressing: with min assist;sit to/from stand (c LRAD PRN) Pt Will Transfer to Toilet: with modified independence;ambulating;regular height toilet (c LRAD PRN)  OT Frequency: Min 2X/week    AM-PAC OT "6 Clicks" Daily Activity     Outcome Measure Help from another person eating meals?: None Help from another person taking care of personal grooming?: A Little Help from another person toileting, which includes using toliet, bedpan, or urinal?: A Little Help from another person bathing (including washing, rinsing, drying)?: A Little Help from another person to put on and taking off regular upper body clothing?: None Help from another person to put on and taking off regular lower body clothing?: A Lot 6 Click Score: 19   End of Session    Activity Tolerance: Patient tolerated treatment well Patient left: in bed;with call bell/phone within reach  OT Visit Diagnosis: Other abnormalities of gait and mobility (R26.89);Muscle weakness (generalized) (M62.81)                Time: 8676-1950 OT Time Calculation (min): 14 min Charges:  OT General Charges $OT Visit: 1 Visit OT Evaluation $OT Eval Moderate Complexity: 1 Mod   Kathie Dike, M.S.  OTR/L  05/14/20, 4:11 PM  ascom [phone]

## 2020-05-14 NOTE — Progress Notes (Signed)
[facility] ADULT ICU REPLACEMENT PROTOCOL   The patient does apply for the [facility] Adult ICU Electrolyte Replacment Protocol based on the criteria listed below:   1. Is GFR >/= 30 ml/min? Yes.    Patient's GFR today is >60 2. Is SCr </= 2? Yes.   Patient's SCr is 1.11 ml/kg/hr 3. Did SCr increase >/= 0.5 in 24 hours? No. 4. Abnormal electrolyte(s):  K 3.2 5. Ordered repletion with: protocol 6. If a panic level lab has been reported, has the CCM MD in charge been notified? Yes.  .   Physician:  S. Bobbye Morton R Wolf Boulay 05/14/2020 5:04 AM

## 2020-05-15 DIAGNOSIS — I5032 Chronic diastolic (congestive) heart failure: Secondary | ICD-10-CM

## 2020-05-15 LAB — MAGNESIUM: Magnesium: 1.9 mg/dL (ref 1.7–2.4)

## 2020-05-15 LAB — BASIC METABOLIC PANEL
Anion gap: 11 (ref 5–15)
BUN: 20 mg/dL (ref 8–23)
CO2: 35 mmol/L — ABNORMAL HIGH (ref 22–32)
Calcium: 8.6 mg/dL — ABNORMAL LOW (ref 8.9–10.3)
Chloride: 91 mmol/L — ABNORMAL LOW (ref 98–111)
Creatinine, Ser: 1.24 mg/dL (ref 0.61–1.24)
GFR, Estimated: >60 mL/min (ref >60)
Glucose, Bld: 105 mg/dL — ABNORMAL HIGH (ref 70–99)
Potassium: 4 mmol/L (ref 3.5–5.1)
Sodium: 137 mmol/L (ref 135–145)

## 2020-05-15 LAB — CBC
HCT: 35.1 % — ABNORMAL LOW (ref 39.0–52.0)
Hemoglobin: 11.1 g/dL — ABNORMAL LOW (ref 13.0–17.0)
MCH: 25.2 pg — ABNORMAL LOW (ref 26.0–34.0)
MCHC: 31.6 g/dL (ref 30.0–36.0)
MCV: 79.6 fL — ABNORMAL LOW (ref 80.0–100.0)
Platelets: 252 10*3/uL (ref 150–400)
RBC: 4.41 MIL/uL (ref 4.22–5.81)
RDW: 23.9 % — ABNORMAL HIGH (ref 11.5–15.5)
WBC: 9.8 10*3/uL (ref 4.0–10.5)
nRBC: 0 % (ref 0.0–0.2)

## 2020-05-15 LAB — GLUCOSE, CAPILLARY
Glucose-Capillary: 100 mg/dL — ABNORMAL HIGH (ref 70–99)
Glucose-Capillary: 211 mg/dL — ABNORMAL HIGH (ref 70–99)
Glucose-Capillary: 268 mg/dL — ABNORMAL HIGH (ref 70–99)
Glucose-Capillary: 123 mg/dL — ABNORMAL HIGH (ref 70–99)

## 2020-05-15 MED ORDER — SODIUM CHLORIDE 0.9 % IV SOLN
INTRAVENOUS | Status: DC | PRN
  Administered 2020-05-15 – 2020-05-17 (×2): 500 mL via INTRAVENOUS

## 2020-05-15 NOTE — TOC Progression Note (Signed)
Transition of Care [facility]) - Progression Note    Patient Details  Name: Gregory Hall MRN: [id number] Date of Birth: November 09, 1955  [facility] CM/SW Contact  Bing Quarry, RN Phone Number: 05/15/2020, 3:10 PM  Clinical Narrative:   Group home informed ICU nurse they will no longer be able to take patient back at this point per provider. Gabriel Cirri RN CM    Expected Discharge Plan: Group Home (with home health)    Expected Discharge Plan and Services Expected Discharge Plan: Group Home (with home health)       Living arrangements for the past 2 months: Group Home                           HH Arranged: RN,PT,OT HH Agency: [facility] Date HH Agency Contacted: 05/14/20   Representative spoke with at [facility] Agency: Crystal Scarberry Cower   Social Determinants of Health (SDOH) Interventions    Readmission Risk Interventions Readmission Risk Prevention Plan 05/14/2020 02/18/2020 12/15/2019  Transportation Screening Complete Complete Complete  PCP or Specialist Appt within 3-5 Days - - -  HRI or Home Care Consult - - -  Social Work Consult for Recovery Care Planning/Counseling - - -  Palliative Care Screening - - -  Medication Review [employer]) Complete (No Data) Complete  PCP or Specialist appointment within 3-5 days of discharge - Complete -  HRI or Home Care Consult Complete Complete Complete  SW Recovery Care/Counseling Consult - Complete -  Palliative Care Screening Not Applicable Not Applicable Not Applicable  Skilled Nursing Facility Not Applicable Not Applicable Not Applicable  Some recent data might be hidden

## 2020-05-15 NOTE — Progress Notes (Signed)
PROGRESS NOTE    Gregory Hall  UMP:[id number] DOB: February 10, 1955 DOA: 05/12/2020 PCP: Koren Bound, NP  Assessment & Plan:   Active Problems:   Hypercapnic respiratory failure (HCC)  Acute hypercapnic & hypoxic respiratory failure: likely secondary to polysubstance abuse. S/p intubation & extubation. Continue on supplemental oxygen and wean as tolerated. Encourage incentive spirometry and continue on bronchodilators   Acute toxic encephalopathy: likely secondary to polysubstance abuse. Urine drug screen was positive for barbiturates. Resolved, back to baseline   Likely pneumonia: improving slowly. Continue on doxycycline, steroids, & bronchodilators. Encourage incentive spirometry    Chronic diastolic CHF: continue on home dose of torsemide. Monitor I/Os. Not on beta blocker as per med rec   DM2: likely poorly controlled. Continue on SSI w/ accuchecks  Hypokalemia: WNL today.     DVT prophylaxis: heparin  Code Status: full  Family Communication:  Disposition Plan: unclear as pt previous facility refused to take the pt back. PT/OT recs HH   Level of care: Progressive Cardiac   Status is: Inpatient  Remains inpatient appropriate because:Unsafe d/c plan, IV treatments appropriate due to intensity of illness or inability to take PO and Inpatient level of care appropriate due to severity of illness   Dispo: The patient is from: group home              Anticipated d/c is to: SNF vs group home               Patient currently is not medically stable to d/c.   Difficult to place patient Yes, pt previous group home refused to take the pt back, CM              is aware    Consultants:   ICU    Procedures:    Antimicrobials: doxycycline    Subjective: Pt c/o chronic generalized pain   Objective: Vitals:   05/14/20 1304 05/14/20 1358 05/14/20 1941 05/15/20 0328  BP: (!) 170/80  (!) 143/82 110/65  Pulse: 81  93 72  Resp: 17  19 18   Temp: 98.6 F (37 C)  98.1  F (36.7 C) 97.8 F (36.6 C)  TempSrc: Oral  Oral   SpO2: 91% 90% 94% 97%  Weight:    92.7 kg  Height:        Intake/Output Summary (Last 24 hours) at 05/15/2020 0809 Last data filed at 05/15/2020 0610 Gross per 24 hour  Intake 1949.73 ml  Output 1850 ml  Net 99.73 ml   Filed Weights   05/13/20 0220 05/14/20 0500 05/15/20 0328  Weight: 98.3 kg 96.9 kg 92.7 kg    Examination:  General exam: Appears comfortable  Respiratory system: clear breath sounds b/l  Cardiovascular system: S1/S2+. No rubs or clicks  Gastrointestinal system: Abd is soft, NT, ND & hypoactive bowel sounds  Central nervous system: Alert and oriented. Moves all extremities   Psychiatry: Judgement and insight appear normal. Flat mood and affect     Data Reviewed: I have personally reviewed following labs and imaging studies  CBC: Recent Labs  Lab 05/12/20 2200 05/13/20 0432 05/14/20 0353 05/15/20 0519  WBC 7.3 7.2 10.1 9.8  HGB 11.1* 11.1* 11.1* 11.1*  HCT 37.6* 36.9* 36.6* 35.1*  MCV 86.0 84.2 81.0 79.6*  PLT 233 222 254 252   Basic Metabolic Panel: Recent Labs  Lab 05/12/20 2200 05/13/20 0432 05/14/20 0353 05/15/20 0519  NA 138 136 140 137  K 3.9 3.7 3.2* 4.0  CL 91* 88* 92* 91*  CO2 36* 37* 37* 35*  GLUCOSE 92 139* 82 105*  BUN 20 20 21 20   CREATININE 1.30* 1.47* 1.11 1.24  CALCIUM 8.5* 8.2* 8.3* 8.6*  MG  --  2.0 2.1 1.9  PHOS  --   --  2.6  --    GFR: Estimated Creatinine Clearance: 63.9 mL/min (by C-G formula based on SCr of 1.24 mg/dL). Liver Function Tests: No results for input(s): AST, ALT, ALKPHOS, BILITOT, PROT, ALBUMIN in the last 168 hours. No results for input(s): LIPASE, AMYLASE in the last 168 hours. No results for input(s): AMMONIA in the last 168 hours. Coagulation Profile: Recent Labs  Lab 05/12/20 2200  INR 1.0   Cardiac Enzymes: No results for input(s): CKTOTAL, CKMB, CKMBINDEX, TROPONINI in the last 168 hours. BNP (last 3 results) No results for input(s):  PROBNP in the last 8760 hours. HbA1C: No results for input(s): HGBA1C in the last 72 hours. CBG: Recent Labs  Lab 05/13/20 0211 05/14/20 1829 05/14/20 2044  GLUCAP 100* 207* 217*   Lipid Profile: No results for input(s): CHOL, HDL, LDLCALC, TRIG, CHOLHDL, LDLDIRECT in the last 72 hours. Thyroid Function Tests: No results for input(s): TSH, T4TOTAL, FREET4, T3FREE, THYROIDAB in the last 72 hours. Anemia Panel: No results for input(s): VITAMINB12, FOLATE, FERRITIN, TIBC, IRON, RETICCTPCT in the last 72 hours. Sepsis Labs: Recent Labs  Lab 05/12/20 2200  PROCALCITON 0.10    Recent Results (from the past 240 hour(s))  Resp Panel by RT-PCR (Flu A&B, Covid) Nasopharyngeal Swab     Status: None   Collection Time: 05/12/20 10:29 PM   Specimen: Nasopharyngeal Swab; Nasopharyngeal(NP) swabs in vial transport medium  Result Value Ref Range Status   SARS Coronavirus 2 by RT PCR NEGATIVE NEGATIVE Final    Comment: (NOTE) SARS-CoV-2 target nucleic acids are NOT DETECTED.  The SARS-CoV-2 RNA is generally detectable in upper respiratory specimens during the acute phase of infection. The lowest concentration of SARS-CoV-2 viral copies this assay can detect is 138 copies/mL. A negative result does not preclude SARS-Cov-2 infection and should not be used as the sole basis for treatment or other patient management decisions. A negative result may occur with  improper specimen collection/handling, submission of specimen other than nasopharyngeal swab, presence of viral mutation(s) within the areas targeted by this assay, and inadequate number of viral copies(<138 copies/mL). A negative result must be combined with clinical observations, patient history, and epidemiological information. The expected result is Negative.  Fact Sheet for Patients:  BloggerCourse.com  Fact Sheet for Healthcare Providers:  SeriousBroker.it  This test is no t  yet approved or cleared by the [place] FDA and  has been authorized for detection and/or diagnosis of SARS-CoV-2 by FDA under an Emergency Use Authorization (EUA). This EUA will remain  in effect (meaning this test can be used) for the duration of the COVID-19 declaration under Section 564(b)(1) of the Act, 21 U.S.C.section 360bbb-3(b)(1), unless the authorization is terminated  or revoked sooner.       Influenza A by PCR NEGATIVE NEGATIVE Final   Influenza B by PCR NEGATIVE NEGATIVE Final    Comment: (NOTE) The Xpert Xpress SARS-CoV-2/FLU/RSV plus assay is intended as an aid in the diagnosis of influenza from Nasopharyngeal swab specimens and should not be used as a sole basis for treatment. Nasal washings and aspirates are unacceptable for Xpert Xpress SARS-CoV-2/FLU/RSV testing.  Fact Sheet for Patients: BloggerCourse.com  Fact Sheet for Healthcare Providers: SeriousBroker.it  This test is not yet approved or cleared  by the [place] and has been authorized for detection and/or diagnosis of SARS-CoV-2 by FDA under an Emergency Use Authorization (EUA). This EUA will remain in effect (meaning this test can be used) for the duration of the COVID-19 declaration under Section 564(b)(1) of the Act, 21 U.S.C. section 360bbb-3(b)(1), unless the authorization is terminated or revoked.  Performed at [facility], [address] [place], [place] [address]   MRSA PCR Screening     Status: None   Collection Time: 05/13/20  2:46 AM   Specimen: Nasopharyngeal  Result Value Ref Range Status   MRSA by PCR NEGATIVE NEGATIVE Final    Comment:        The GeneXpert MRSA Assay (FDA approved for NASAL specimens only), is one component of a comprehensive MRSA colonization surveillance program. It is not intended to diagnose MRSA infection nor to guide or monitor treatment for MRSA infections. Performed at [facility], [address] [place], [place] [address]          Radiology Studies: No results found.      Scheduled Meds: . arformoterol  15 mcg Nebulization BID  . budesonide (PULMICORT) nebulizer solution  0.5 mg Nebulization BID  . chlorhexidine  15 mL Mouth Rinse BID  . Chlorhexidine Gluconate Cloth  6 each Topical Daily  . DULoxetine  60 mg Oral Daily  . heparin  5,000 Units Subcutaneous Q8H  . insulin aspart  0-9 Units Subcutaneous TID WC  . ipratropium-albuterol  3 mL Nebulization Q6H  . mouth rinse  15 mL Mouth Rinse q12n4p  . nicotine  14 mg Transdermal Daily  . pantoprazole  40 mg Oral QHS  . predniSONE  40 mg Oral Q breakfast  . pregabalin  200 mg Oral TID  . revefenacin  175 mcg Nebulization Daily  . sodium chloride flush  10 mL Intravenous Q12H  . torsemide  40 mg Oral Daily  . traZODone  25 mg Oral QHS   Continuous Infusions: . sodium chloride 500 mL (05/15/20 0509)  . doxycycline (VIBRAMYCIN) IV 100 mg (05/15/20 0513)     LOS: 2 days    Time spent: 30 mins     Charise Killian, MD [facility] Pager [phone]  If 7PM-7AM, please contact night-coverage 05/15/2020, 8:09 AM

## 2020-05-16 LAB — GLUCOSE, CAPILLARY
Glucose-Capillary: 234 mg/dL — ABNORMAL HIGH (ref 70–99)
Glucose-Capillary: 188 mg/dL — ABNORMAL HIGH (ref 70–99)
Glucose-Capillary: 234 mg/dL — ABNORMAL HIGH (ref 70–99)
Glucose-Capillary: 157 mg/dL — ABNORMAL HIGH (ref 70–99)

## 2020-05-16 LAB — BASIC METABOLIC PANEL
Anion gap: 10 (ref 5–15)
BUN: 21 mg/dL (ref 8–23)
CO2: 33 mmol/L — ABNORMAL HIGH (ref 22–32)
Calcium: 9 mg/dL (ref 8.9–10.3)
Chloride: 96 mmol/L — ABNORMAL LOW (ref 98–111)
Creatinine, Ser: 1.12 mg/dL (ref 0.61–1.24)
GFR, Estimated: >60 mL/min (ref >60)
Glucose, Bld: 219 mg/dL — ABNORMAL HIGH (ref 70–99)
Potassium: 4.4 mmol/L (ref 3.5–5.1)
Sodium: 139 mmol/L (ref 135–145)

## 2020-05-16 LAB — CBC
HCT: 35.7 % — ABNORMAL LOW (ref 39.0–52.0)
Hemoglobin: 11.3 g/dL — ABNORMAL LOW (ref 13.0–17.0)
MCH: 25.1 pg — ABNORMAL LOW (ref 26.0–34.0)
MCHC: 31.7 g/dL (ref 30.0–36.0)
MCV: 79.2 fL — ABNORMAL LOW (ref 80.0–100.0)
Platelets: 261 10*3/uL (ref 150–400)
RBC: 4.51 MIL/uL (ref 4.22–5.81)
RDW: 23.6 % — ABNORMAL HIGH (ref 11.5–15.5)
WBC: 10.1 10*3/uL (ref 4.0–10.5)
nRBC: 0 % (ref 0.0–0.2)

## 2020-05-16 LAB — MAGNESIUM: Magnesium: 1.9 mg/dL (ref 1.7–2.4)

## 2020-05-16 MED ORDER — IPRATROPIUM-ALBUTEROL 0.5-2.5 (3) MG/3ML IN SOLN: 3.0000 mL | RESPIRATORY_TRACT | Status: DC | PRN

## 2020-05-16 NOTE — Progress Notes (Addendum)
PROGRESS NOTE    Gregory Hall  WEX:[id number] DOB: 1955/11/18 DOA: 05/12/2020 PCP: Koren Bound, NP  Assessment & Plan:   Active Problems:   Hypercapnic respiratory failure (HCC)  Acute hypercapnic & hypoxic respiratory failure: likely secondary to polysubstance abuse. S/p intubation & extubation. Continue on supplemental oxygen and wean as tolerated, currently on 3L [place]. Encourage incentive spirometry and continue on bronchodilators   Acute toxic encephalopathy: likely secondary to polysubstance abuse. Urine drug screen was positive for barbiturates. Resolved, back to baseline   Likely pneumonia: continue on doxycycline, bronchodilators. Completed steroid course. Encourage incentive spirometry   Chronic diastolic CHF: continue on home dose of torsemide. Monitor I/Os. Neg approx 1 L  DM2: likely poorly controlled. Continue on SSI w/ accuchecks   Hypokalemia: within normal limits today     DVT prophylaxis: heparin  Code Status: full  Family Communication:  Disposition Plan: unclear as pt previous facility refused to take the pt back. PT/OT recs HH   Level of care: Progressive Cardiac   Status is: Inpatient  Remains inpatient appropriate because:Unsafe d/c plan, IV treatments appropriate due to intensity of illness or inability to take PO and Inpatient level of care appropriate due to severity of illness   Dispo: The patient is from: group home              Anticipated d/c is to: SNF vs group home               Patient currently is not medically stable to d/c.   Difficult to place patient Yes, pt previous group home refused to take the pt back, CM              is aware and working on other options     Consultants:   ICU    Procedures:    Antimicrobials: doxycycline    Subjective: Pt c/o malaise   Objective: Vitals:   05/16/20 0347 05/16/20 0801 05/16/20 0801 05/16/20 1206  BP: (!) 98/51  119/63 (!) 119/58  Pulse: 71 63 66 75  Resp: 16 18 17 17    Temp: 98.7 F (37.1 C)  98.7 F (37.1 C) 98.9 F (37.2 C)  TempSrc: Oral  Oral Oral  SpO2: 94% 94% 92% 93%  Weight: 91.8 kg     Height:        Intake/Output Summary (Last 24 hours) at 05/16/2020 1247 Last data filed at 05/16/2020 1158 Gross per 24 hour  Intake 2009.3 ml  Output 3051 ml  Net -1041.7 ml   Filed Weights   05/14/20 0500 05/15/20 0328 05/16/20 0347  Weight: 96.9 kg 92.7 kg 91.8 kg    Examination:  General exam: Appears calm & comfortable  Respiratory system: decreased breath sounds b/l   Cardiovascular system: S1 & S2+. No gallops or rubs  Gastrointestinal system: Abd is soft, NT, ND & hypoactive bowel sounds  Central nervous system: Alert and oriented. Moves all extremities  Psychiatry: Judgement and insight appear normal. Flat mood and affect     Data Reviewed: I have personally reviewed following labs and imaging studies  CBC: Recent Labs  Lab 05/12/20 2200 05/13/20 0432 05/14/20 0353 05/15/20 0519 05/16/20 0452  WBC 7.3 7.2 10.1 9.8 10.1  HGB 11.1* 11.1* 11.1* 11.1* 11.3*  HCT 37.6* 36.9* 36.6* 35.1* 35.7*  MCV 86.0 84.2 81.0 79.6* 79.2*  PLT 233 222 254 252 261   Basic Metabolic Panel: Recent Labs  Lab 05/12/20 2200 05/13/20 0432 05/14/20 0353 05/15/20 0519 05/16/20 07/16/20  NA 138 136 140 137 139  K 3.9 3.7 3.2* 4.0 4.4  CL 91* 88* 92* 91* 96*  CO2 36* 37* 37* 35* 33*  GLUCOSE 92 139* 82 105* 219*  BUN 20 20 21 20 21   CREATININE 1.30* 1.47* 1.11 1.24 1.12  CALCIUM 8.5* 8.2* 8.3* 8.6* 9.0  MG  --  2.0 2.1 1.9 1.9  PHOS  --   --  2.6  --   --    GFR: Estimated Creatinine Clearance: 70.4 mL/min (by C-G formula based on SCr of 1.12 mg/dL). Liver Function Tests: No results for input(s): AST, ALT, ALKPHOS, BILITOT, PROT, ALBUMIN in the last 168 hours. No results for input(s): LIPASE, AMYLASE in the last 168 hours. No results for input(s): AMMONIA in the last 168 hours. Coagulation Profile: Recent Labs  Lab 05/12/20 2200  INR 1.0    Cardiac Enzymes: No results for input(s): CKTOTAL, CKMB, CKMBINDEX, TROPONINI in the last 168 hours. BNP (last 3 results) No results for input(s): PROBNP in the last 8760 hours. HbA1C: No results for input(s): HGBA1C in the last 72 hours. CBG: Recent Labs  Lab 05/15/20 1124 05/15/20 1604 05/15/20 2023 05/16/20 0803 05/16/20 1207  GLUCAP 211* 268* 123* 234* 188*   Lipid Profile: No results for input(s): CHOL, HDL, LDLCALC, TRIG, CHOLHDL, LDLDIRECT in the last 72 hours. Thyroid Function Tests: No results for input(s): TSH, T4TOTAL, FREET4, T3FREE, THYROIDAB in the last 72 hours. Anemia Panel: No results for input(s): VITAMINB12, FOLATE, FERRITIN, TIBC, IRON, RETICCTPCT in the last 72 hours. Sepsis Labs: Recent Labs  Lab 05/12/20 2200  PROCALCITON 0.10    Recent Results (from the past 240 hour(s))  Resp Panel by RT-PCR (Flu A&B, Covid) Nasopharyngeal Swab     Status: None   Collection Time: 05/12/20 10:29 PM   Specimen: Nasopharyngeal Swab; Nasopharyngeal(NP) swabs in vial transport medium  Result Value Ref Range Status   SARS Coronavirus 2 by RT PCR NEGATIVE NEGATIVE Final    Comment: (NOTE) SARS-CoV-2 target nucleic acids are NOT DETECTED.  The SARS-CoV-2 RNA is generally detectable in upper respiratory specimens during the acute phase of infection. The lowest concentration of SARS-CoV-2 viral copies this assay can detect is 138 copies/mL. A negative result does not preclude SARS-Cov-2 infection and should not be used as the sole basis for treatment or other patient management decisions. A negative result may occur with  improper specimen collection/handling, submission of specimen other than nasopharyngeal swab, presence of viral mutation(s) within the areas targeted by this assay, and inadequate number of viral copies(<138 copies/mL). A negative result must be combined with clinical observations, patient history, and epidemiological information. The expected  result is Negative.  Fact Sheet for Patients:  07/12/20  Fact Sheet for Healthcare Providers:  BloggerCourse.com  This test is no t yet approved or cleared by the [place] FDA and  has been authorized for detection and/or diagnosis of SARS-CoV-2 by FDA under an Emergency Use Authorization (EUA). This EUA will remain  in effect (meaning this test can be used) for the duration of the COVID-19 declaration under Section 564(b)(1) of the Act, 21 U.S.C.section 360bbb-3(b)(1), unless the authorization is terminated  or revoked sooner.       Influenza A by PCR NEGATIVE NEGATIVE Final   Influenza B by PCR NEGATIVE NEGATIVE Final    Comment: (NOTE) The Xpert Xpress SARS-CoV-2/FLU/RSV plus assay is intended as an aid in the diagnosis of influenza from Nasopharyngeal swab specimens and should not be used as a sole  basis for treatment. Nasal washings and aspirates are unacceptable for Xpert Xpress SARS-CoV-2/FLU/RSV testing.  Fact Sheet for Patients: BloggerCourse.com  Fact Sheet for Healthcare Providers: SeriousBroker.it  This test is not yet approved or cleared by the [place] FDA and has been authorized for detection and/or diagnosis of SARS-CoV-2 by FDA under an Emergency Use Authorization (EUA). This EUA will remain in effect (meaning this test can be used) for the duration of the COVID-19 declaration under Section 564(b)(1) of the Act, 21 U.S.C. section 360bbb-3(b)(1), unless the authorization is terminated or revoked.  Performed at [facility], [address] [place], [place] [address]   MRSA PCR Screening     Status: None   Collection Time: 05/13/20  2:46 AM   Specimen: Nasopharyngeal  Result Value Ref Range Status   MRSA by PCR NEGATIVE NEGATIVE Final    Comment:        The GeneXpert MRSA Assay (FDA approved for NASAL specimens only), is one  component of a comprehensive MRSA colonization surveillance program. It is not intended to diagnose MRSA infection nor to guide or monitor treatment for MRSA infections. Performed at [facility], [address], [place], [place] [address]          Radiology Studies: No results found.      Scheduled Meds: . arformoterol  15 mcg Nebulization BID  . budesonide (PULMICORT) nebulizer solution  0.5 mg Nebulization BID  . chlorhexidine  15 mL Mouth Rinse BID  . Chlorhexidine Gluconate Cloth  6 each Topical Daily  . DULoxetine  60 mg Oral Daily  . heparin  5,000 Units Subcutaneous Q8H  . insulin aspart  0-9 Units Subcutaneous TID WC  . mouth rinse  15 mL Mouth Rinse q12n4p  . nicotine  14 mg Transdermal Daily  . pantoprazole  40 mg Oral QHS  . pregabalin  200 mg Oral TID  . revefenacin  175 mcg Nebulization Daily  . sodium chloride flush  10 mL Intravenous Q12H  . torsemide  40 mg Oral Daily  . traZODone  25 mg Oral QHS   Continuous Infusions: . sodium chloride Stopped (05/15/20 1246)  . doxycycline (VIBRAMYCIN) IV 125 mL/hr at 05/16/20 0600     LOS: 3 days    Time spent: 31 mins     Charise Killian, MD [facility] Pager [phone]  If 7PM-7AM, please contact night-coverage 05/16/2020, 12:47 PM

## 2020-05-17 ENCOUNTER — Inpatient Hospital Stay: Payer: Medicare Other

## 2020-05-17 LAB — CBC
HCT: 39.5 % (ref 39.0–52.0)
Hemoglobin: 12.6 g/dL — ABNORMAL LOW (ref 13.0–17.0)
MCH: 25.6 pg — ABNORMAL LOW (ref 26.0–34.0)
MCHC: 31.9 g/dL (ref 30.0–36.0)
MCV: 80.3 fL (ref 80.0–100.0)
Platelets: 267 10*3/uL (ref 150–400)
RBC: 4.92 MIL/uL (ref 4.22–5.81)
RDW: 23.6 % — ABNORMAL HIGH (ref 11.5–15.5)
WBC: 11.7 10*3/uL — ABNORMAL HIGH (ref 4.0–10.5)
nRBC: 0 % (ref 0.0–0.2)

## 2020-05-17 LAB — BASIC METABOLIC PANEL
Anion gap: 8 (ref 5–15)
BUN: 22 mg/dL (ref 8–23)
CO2: 36 mmol/L — ABNORMAL HIGH (ref 22–32)
Calcium: 9.1 mg/dL (ref 8.9–10.3)
Chloride: 93 mmol/L — ABNORMAL LOW (ref 98–111)
Creatinine, Ser: 1.21 mg/dL (ref 0.61–1.24)
GFR, Estimated: >60 mL/min (ref >60)
Glucose, Bld: 124 mg/dL — ABNORMAL HIGH (ref 70–99)
Potassium: 4.7 mmol/L (ref 3.5–5.1)
Sodium: 137 mmol/L (ref 135–145)

## 2020-05-17 LAB — GLUCOSE, CAPILLARY
Glucose-Capillary: 161 mg/dL — ABNORMAL HIGH (ref 70–99)
Glucose-Capillary: 84 mg/dL (ref 70–99)
Glucose-Capillary: 168 mg/dL — ABNORMAL HIGH (ref 70–99)
Glucose-Capillary: 135 mg/dL — ABNORMAL HIGH (ref 70–99)

## 2020-05-17 LAB — MAGNESIUM: Magnesium: 1.8 mg/dL (ref 1.7–2.4)

## 2020-05-17 MED ORDER — DOXYCYCLINE HYCLATE 100 MG PO TABS
100.0000 mg | ORAL_TABLET | Freq: Two times a day (BID) | ORAL | Status: DC
  Administered 2020-05-17 – 2020-05-18 (×2): 100 mg via ORAL
  Filled 2020-05-17 (×2): qty 1

## 2020-05-17 NOTE — Progress Notes (Signed)
Physical Therapy Treatment Patient Details Name: Gregory Hall MRN: [id number] DOB: 1955-12-01 Today's Date: 05/17/2020    History of Present Illness presented to ER secondary to AMS, hypoxia; admitted for acute hypercapnic/hypoexemic respiratory failure.  Intubated 5/4-5/5, now weaned to 4L supplemental O2 (baseline levels)    PT Comments    Pt ready for session.  Stated he is up with nursing to/fro commode with no issues.  Bed mobility without assist.  Stand and is able to make 2 short laps in room with 3 lpm, RW and min guard/assist.  Limited by fatigue and SOB.  Sats 86% upon sitting EOB with slow recovery but is able to get to 90% without any increased O2 support.  Declined further activity at this time.   Follow Up Recommendations  Home health PT     Equipment Recommendations       Recommendations for Other Services       Precautions / Restrictions Precautions Precautions: Fall Restrictions Weight Bearing Restrictions: No    Mobility  Bed Mobility Overal bed mobility: Modified Independent                  Transfers Overall transfer level: Needs assistance Equipment used: Rolling walker (2 wheeled) Transfers: Sit to/from Stand Sit to Stand: Min guard;Supervision            Ambulation/Gait Ambulation/Gait assistance: Land (Feet): 40 Feet Assistive device: Rolling walker (2 wheeled) Gait Pattern/deviations: Step-through pattern;Decreased step length - right;Decreased step length - left;Trunk flexed Gait velocity: decreased   General Gait Details: slow but generally steady gait limited by weakness and SOB (sats 86% after gait on 3 lpm)   Stairs             Wheelchair Mobility    Modified Rankin (Stroke Patients Only)       Balance Overall balance assessment: Needs assistance Sitting-balance support: No upper extremity supported;Feet supported Sitting balance-Leahy Scale: Good     Standing balance support:  Bilateral upper extremity supported Standing balance-Leahy Scale: Fair                              Cognition Arousal/Alertness: Awake/alert Behavior During Therapy: WFL for tasks assessed/performed Overall Cognitive Status: Within Functional Limits for tasks assessed                                        Exercises      General Comments        Pertinent Vitals/Pain Pain Assessment: No/denies pain    Home Living                      Prior Function            PT Goals (current goals can now be found in the care plan section) Progress towards PT goals: Progressing toward goals    Frequency    Min 2X/week      PT Plan      Co-evaluation              AM-PAC PT "6 Clicks" Mobility   Outcome Measure  Help needed turning from your back to your side while in a flat bed without using bedrails?: None Help needed moving from lying on your back to sitting on the side of a flat bed without using bedrails?:  None Help needed moving to and from a bed to a chair (including a wheelchair)?: A Little Help needed standing up from a chair using your arms (e.g., wheelchair or bedside chair)?: A Little Help needed to walk in hospital room?: A Little Help needed climbing 3-5 steps with a railing? : A Little 6 Click Score: 20    End of Session Equipment Utilized During Treatment: Gait belt;Oxygen Activity Tolerance: Patient tolerated treatment well Patient left: in bed;with call bell/phone within reach;with bed alarm set (bed in chair position) Nurse Communication: Mobility status PT Visit Diagnosis: Muscle weakness (generalized) (M62.81);Difficulty in walking, not elsewhere classified (R26.2)     Time: 8144-8185 PT Time Calculation (min) (ACUTE ONLY): 14 min  Charges:  $Gait Training: 8-22 mins                    Danielle Dess, PTA 05/17/20, 3:29 PM

## 2020-05-17 NOTE — Progress Notes (Signed)
Pt education completed on new medication Yupelri. Pt given handout on medication describing medication. Pt instructed to read over information, write down any questions that he might have regarding Yupelri and call RT.

## 2020-05-17 NOTE — Progress Notes (Signed)
OT Cancellation Note  Patient Details Name: Gregory Hall MRN: [id number] DOB: December 21, 1955   Cancelled Treatment:    Reason Eval/Treat Not Completed: Patient at procedure or test/ unavailable. Pt out for chest xray. Will re-attempt OT tx next date as appropriate.   Wynona Canes, MPH, MS, OTR/L ascom [phone] 05/17/20, 4:22 PM

## 2020-05-17 NOTE — TOC Progression Note (Signed)
Transition of Care [facility]) - Progression Note    Patient Details  Name: Ryman Rathgeber MRN: [id number] Date of Birth: 1955-02-25  Transition of Care [facility]) CM/SW Contact  Gildardo Griffes, [place] Phone Number: 05/17/2020, 3:28 PM  Clinical Narrative:     CSW spoke with Amalia Hailey at [facility] at [phone], reports they will need TB test (skin or chest xray), FL2 and progress note to review. Reports he has discussed with patient financial aspect of going to ALF and will review chart prior to offering bed.   All documents have been faxed to [phone].     Expected Discharge Plan: Group Home (with home health)    Expected Discharge Plan and Services Expected Discharge Plan: Group Home (with home health)       Living arrangements for the past 2 months: Group Home                           HH Arranged: RN,PT,OT HH Agency: [facility] Date HH Agency Contacted: 05/14/20   Representative spoke with at [facility] Agency: Barbara Cower   Social Determinants of Health (SDOH) Interventions    Readmission Risk Interventions Readmission Risk Prevention Plan 05/14/2020 02/18/2020 12/15/2019  Transportation Screening Complete Complete Complete  PCP or Specialist Appt within 3-5 Days - - -  HRI or Home Care Consult - - -  Social Work Consult for Recovery Care Planning/Counseling - - -  Palliative Care Screening - - -  Medication Review [facility]) Complete (No Data) Complete  PCP or Specialist appointment within 3-5 days of discharge - Complete -  HRI or Home Care Consult Complete Complete Complete  SW Recovery Care/Counseling Consult - Complete -  Palliative Care Screening Not Applicable Not Applicable Not Applicable  Skilled Nursing Facility Not Applicable Not Applicable Not Applicable  Some recent data might be hidden

## 2020-05-17 NOTE — NC FL2 (Addendum)
[place] MEDICA[place] FL2 LEVEL OF CARE SCREENING TOOL     IDENTIFICATION  Patient Name: Gregory Hall Birthdate: 04/08/1955 Sex: male Admission Date (Current Location): 05/12/2020  [place] and [place] Number:  Chiropodist and Address:  [facility], [address] [place], [place] [address]      Provider Number: [id number]  Attending Physician Name and Address:  Charise Killian, MD  Relative Name and Phone Number:  Camelia Eng (sister) [phone]    Current Level of Care: Hospital Recommended Level of Care: Assisted Living Facility Prior Approval Number:    Date Approved/Denied:   PASRR Number: [id number]  Discharge Plan: Other (Comment) (ALF)    Current Diagnoses: Patient Active Problem List   Diagnosis Date Noted  . Hypercapnic respiratory failure (HCC) 05/13/2020  . Altered mental status 04/08/2020  . AMS (altered mental status) 04/08/2020  . Prolonged QT interval 04/08/2020  . Acute metabolic encephalopathy 04/08/2020  . Continuous tobacco abuse 03/20/2020  . AKI (acute kidney injury) (HCC) 02/17/2020  . Diabetes mellitus without complication (HCC)   . Diastolic dysfunction with chronic heart failure (HCC)   . Near syncope   . Acute respiratory failure (HCC) 12/13/2019  . Palliative care by specialist   . DNR (do not resuscitate)   . Acute CHF (congestive heart failure) (HCC) 10/08/2019  . Chronic respiratory failure with hypoxia (HCC) 10/08/2019  . Atrial flutter (HCC)   . Acute on chronic respiratory failure with hypoxia (HCC) 09/13/2019  . Acute diastolic CHF (congestive heart failure) (HCC) 09/12/2019  . NSTEMI (non-ST elevated myocardial infarction) (HCC) 09/12/2019  . Acute kidney failure, unspecified (HCC) 09/12/2019  . Diabetes mellitus type 2, uncontrolled, with complications (HCC) 06/10/2019  . Chronic pain syndrome 06/10/2019  . COPD exacerbation (HCC) 06/10/2019  . COPD with acute exacerbation (HCC)  06/09/2019  . CAD (coronary artery disease) 06/09/2019  . HTN (hypertension) 06/09/2019  . Acute on chronic respiratory failure with hypoxia and hypercapnia (HCC) 06/09/2019  . Anxiety 06/09/2019  . Chronic prescription opiate use 06/09/2019  . Hyperglycemia 06/09/2019  . Subclavian artery stenosis, left (HCC) 08/20/2014  . Rectus diastasis 07/19/2012  . Hernia   . Edema 05/10/2011  . Goals of care, counseling/discussion 10/06/2010  . SMOKER 11/25/2009  . CAROTID BRUIT, RIGHT 11/24/2009  . Chest pain 11/24/2009  . Hyperlipidemia 03/30/2009  . Coronary atherosclerosis 03/30/2009  . HYPERTENSION, HX OF 03/30/2009    Orientation RESPIRATION BLADDER Height & Weight     Self,Time,Situation,Place  O2,Other (Comment) (3L O2 Nasal Cannula, nebulizer, BiPAP adult large full face mask set rate 14, resp rate 19, IPAP 10, EPAP 5, oxygen percent 35) Continent Weight: 201 lb 15.1 oz (91.6 kg) Height:   (172.7 cm)  BEHAVIORAL SYMPTOMS/MOOD NEUROLOGICAL BOWEL NUTRITION STATUS      Continent Diet (carb modified)  AMBULATORY STATUS COMMUNICATION OF NEEDS Skin   Limited Assist Verbally Normal,Other (Comment) (ecchymosis right and left arm)                       Personal Care Assistance Level of Assistance  Bathing,Feeding,Dressing,Total care Bathing Assistance: Independent Feeding assistance: Independent Dressing Assistance: Limited assistance Total Care Assistance: Limited assistance   Functional Limitations Info  Sight,Hearing,Speech Sight Info: Impaired Hearing Info: Adequate Speech Info: Adequate    SPECIAL CARE FACTORS FREQUENCY  PT (By licensed PT),OT (By licensed OT)     PT Frequency: min 2x weekly OT Frequency: min 2x weekly  Contractures Contractures Info: Not present    Additional Factors Info  Code Status,Isolation Precautions,Allergies Code Status Info: Full Allergies Info: Acetaminophen, fentanyl, nsaids     Isolation Precautions Info: MRSA  by PCR     TAKE these medications   alfuzosin 10 MG 24 hr tablet Commonly known as: UROXATRAL Take 1 tablet (10 mg total) by mouth daily with breakfast.   amiodarone 200 MG tablet Commonly known as: PACERONE Take 1 tablet (200 mg total) by mouth daily.   atorvastatin 40 MG tablet Commonly known as: LIPITOR Take 1 tablet (40 mg total) by mouth daily.   budesonide 0.5 MG/2ML nebulizer solution Commonly known as: PULMICORT Take 0.5 mg by nebulization 2 (two) times daily.   doxycycline 100 MG tablet Commonly known as: VIBRA-TABS Take 1 tablet (100 mg total) by mouth every 12 (twelve) hours for 2 days.   DULoxetine 60 MG capsule Commonly known as: CYMBALTA Take 60 mg by mouth daily.   Ensure Max Protein Liqd Take 330 mLs (11 oz total) by mouth 2 (two) times daily between meals. What changed:   when to take this  additional instructions   glyBURIDE 5 MG tablet Commonly known as: DIABETA Take 5 mg by mouth daily.   guaiFENesin 600 MG 12 hr tablet Commonly known as: MUCINEX Take 1 tablet (600 mg total) by mouth 2 (two) times daily. What changed:   when to take this  reasons to take this   insulin lispro 100 UNIT/ML injection Commonly known as: HUMALOG Inject 4 Units into the skin See admin instructions. Inject 4 units with blood sugar 150-250; inject 6 units 251-350; greater than 351 call provider; Hold for FSBG <150   ipratropium-albuterol 0.5-2.5 (3) MG/3ML Soln Commonly known as: DUONEB Take 3 mLs by nebulization every 6 (six) hours as needed.   lactulose 10 GM/15ML solution Commonly known as: CHRONULAC Take 30 mLs by mouth daily as needed for mild constipation or moderate constipation.   lamoTRIgine 100 MG tablet Commonly known as: LAMICTAL Take 100 mg by mouth daily.   Lantus SoloStar 100 UNIT/ML Solostar Pen Generic drug: insulin glargine Inject 11 Units into the skin daily.   magnesium oxide 400 (241.3 Mg) MG tablet Commonly known as:  MAG-OX Take 400 mg by mouth daily.   midodrine 10 MG tablet Commonly known as: PROAMATINE Take 1 tablet (10 mg total) by mouth 2 (two) times daily. Take if top blood pressure reading is less than 100   NAC 600 MG Caps Generic drug: Acetylcysteine Take 600 mg by mouth daily.   Nitrostat 0.4 MG SL tablet Generic drug: nitroGLYCERIN DISSOLVE (1) TABLET UNDER TONGUE AS NEEDED TO RELIEVE CHEST PAIN. MAYREPEAT EVERY 5 MINUTES. What changed: See the new instructions.   ondansetron 4 MG disintegrating tablet Commonly known as: ZOFRAN-ODT Take 1 tablet (4 mg total) by mouth every 8 (eight) hours as needed for nausea or vomiting.   Oxycodone HCl 10 MG Tabs Take 1 tablet (10 mg total) by mouth 3 (three) times daily. What changed: Another medication with the same name was added. Make sure you understand how and when to take each.   Oxycodone HCl 10 MG Tabs Take 1 tablet (10 mg total) by mouth every 6 (six) hours as needed for up to 1 day for moderate pain or severe pain. What changed: You were already taking a medication with the same name, and this prescription was added. Make sure you understand how and when to take each.   pantoprazole 40 MG tablet  Commonly known as: PROTONIX Take 40 mg by mouth daily.   potassium chloride SA 20 MEQ tablet Commonly known as: KLOR-CON Take 10 mEq by mouth daily.   predniSONE 2.5 MG tablet Commonly known as: DELTASONE Take 2.5 mg by mouth daily.   pregabalin 200 MG capsule Commonly known as: LYRICA Take 1 capsule (200 mg total) by mouth 3 (three) times daily.   rivaroxaban 20 MG Tabs tablet Commonly known as: XARELTO Take 20 mg by mouth daily with supper.   torsemide 20 MG tablet Commonly known as: DEMADEX Take 1 tablet (20 mg total) by mouth daily. What changed: how much to take   traZODone 150 MG tablet Commonly known as: DESYREL Take 150 mg by mouth at bedtime.   Trelegy Ellipta 100-62.5-25 MCG/INH Aepb Generic drug:  Fluticasone-Umeclidin-Vilant Inhale 1 puff into the lungs daily.   Vitamin D3 50 MCG (2000 UT) Tabs Take 2,000 Units by mouth daily.    Discharge Medications: Please see discharge summary for a list of discharge medications.  Relevant Imaging Results:  Relevant Lab Results:   Additional Information SSN: [id number]  Gildardo Griffes, LCSW

## 2020-05-17 NOTE — Progress Notes (Signed)
PROGRESS NOTE    Gregory Hall  PJK:[id number] DOB: 17-Mar-1955 DOA: 05/12/2020 PCP: Koren Bound, NP  Assessment & Plan:   Active Problems:   Hypercapnic respiratory failure (HCC)  Acute hypercapnic & hypoxic respiratory failure: likely secondary to polysubstance abuse. S/p intubation & extubation. Continue on supplemental oxygen and wean as tolerated, currently on 3L [place]. Encourage incentive spirometry and continue on bronchodilators   COPD: severe. Continue on bronchodilators and encourage incentive spirometry.   Acute toxic encephalopathy: likely secondary to polysubstance abuse. Urine drug screen was positive for barbiturates. Resolved, back to baseline   Likely pneumonia: improving. Continue on bronchodilators, doxycycline. Completed steroid course. Encourage incentive spirometry   Chronic diastolic CHF: continue on home dose of torsemide. Monitor I/Os  DM2: likely poorly controlled. Continue on SSI w/ accuchecks   Hypokalemia: WNL today     DVT prophylaxis: heparin  Code Status: full  Family Communication:  Disposition Plan: unclear as pt previous facility refused to take the pt back. PT/OT recs HH. Palliative are consulted for goals of care  Level of care: Progressive Cardiac   Status is: Inpatient  Remains inpatient appropriate because:Unsafe d/c plan, IV treatments appropriate due to intensity of illness or inability to take PO and Inpatient level of care appropriate due to severity of illness   Dispo: The patient is from: group home              Anticipated d/c is to: SNF vs group home               Patient currently is not medically stable to d/c.   Difficult to place patient Yes, pt previous group home refused to take the pt back, CM              is aware and working on other options     Consultants:   ICU    Procedures:    Antimicrobials: doxycycline    Subjective: Pt c/o fatigue   Objective: Vitals:   05/16/20 1645 05/16/20 2011  05/16/20 2155 05/17/20 0432  BP: 125/65  123/67 134/67  Pulse: 79  78 67  Resp: 17     Temp: 98.9 F (37.2 C)  98.2 F (36.8 C) 98.1 F (36.7 C)  TempSrc:      SpO2: 95% 94% 93% 98%  Weight:    91.6 kg  Height:        Intake/Output Summary (Last 24 hours) at 05/17/2020 0737 Last data filed at 05/17/2020 0414 Gross per 24 hour  Intake 849 ml  Output 3276 ml  Net -2427 ml   Filed Weights   05/15/20 0328 05/16/20 0347 05/17/20 0432  Weight: 92.7 kg 91.8 kg 91.6 kg    Examination:  General exam: Appears comfortable  Respiratory system: course breath sounds b/l  Cardiovascular system: S1/S2+. No clicks or rubs   Gastrointestinal system: Abd is soft, NT, ND & normal bowel sounds  Central nervous system: Alert and oriented. Moves all extremities  Psychiatry: Judgement and insight appear normal. Flat mood and affect     Data Reviewed: I have personally reviewed following labs and imaging studies  CBC: Recent Labs  Lab 05/13/20 0432 05/14/20 0353 05/15/20 0519 05/16/20 0452 05/17/20 0455  WBC 7.2 10.1 9.8 10.1 11.7*  HGB 11.1* 11.1* 11.1* 11.3* 12.6*  HCT 36.9* 36.6* 35.1* 35.7* 39.5  MCV 84.2 81.0 79.6* 79.2* 80.3  PLT 222 254 252 261 267   Basic Metabolic Panel: Recent Labs  Lab 05/13/20 0432 05/14/20 0353 05/15/20  9937 05/16/20 0452 05/17/20 0455  NA 136 140 137 139 137  K 3.7 3.2* 4.0 4.4 4.7  CL 88* 92* 91* 96* 93*  CO2 37* 37* 35* 33* 36*  GLUCOSE 139* 82 105* 219* 124*  BUN 20 21 20 21 22   CREATININE 1.47* 1.11 1.24 1.12 1.21  CALCIUM 8.2* 8.3* 8.6* 9.0 9.1  MG 2.0 2.1 1.9 1.9 1.8  PHOS  --  2.6  --   --   --    GFR: Estimated Creatinine Clearance: 65.1 mL/min (by C-G formula based on SCr of 1.21 mg/dL). Liver Function Tests: No results for input(s): AST, ALT, ALKPHOS, BILITOT, PROT, ALBUMIN in the last 168 hours. No results for input(s): LIPASE, AMYLASE in the last 168 hours. No results for input(s): AMMONIA in the last 168 hours. Coagulation  Profile: Recent Labs  Lab 05/12/20 2200  INR 1.0   Cardiac Enzymes: No results for input(s): CKTOTAL, CKMB, CKMBINDEX, TROPONINI in the last 168 hours. BNP (last 3 results) No results for input(s): PROBNP in the last 8760 hours. HbA1C: No results for input(s): HGBA1C in the last 72 hours. CBG: Recent Labs  Lab 05/15/20 2023 05/16/20 0803 05/16/20 1207 05/16/20 1647 05/16/20 1959  GLUCAP 123* 234* 188* 234* 157*   Lipid Profile: No results for input(s): CHOL, HDL, LDLCALC, TRIG, CHOLHDL, LDLDIRECT in the last 72 hours. Thyroid Function Tests: No results for input(s): TSH, T4TOTAL, FREET4, T3FREE, THYROIDAB in the last 72 hours. Anemia Panel: No results for input(s): VITAMINB12, FOLATE, FERRITIN, TIBC, IRON, RETICCTPCT in the last 72 hours. Sepsis Labs: Recent Labs  Lab 05/12/20 2200  PROCALCITON 0.10    Recent Results (from the past 240 hour(s))  Resp Panel by RT-PCR (Flu A&B, Covid) Nasopharyngeal Swab     Status: None   Collection Time: 05/12/20 10:29 PM   Specimen: Nasopharyngeal Swab; Nasopharyngeal(NP) swabs in vial transport medium  Result Value Ref Range Status   SARS Coronavirus 2 by RT PCR NEGATIVE NEGATIVE Final    Comment: (NOTE) SARS-CoV-2 target nucleic acids are NOT DETECTED.  The SARS-CoV-2 RNA is generally detectable in upper respiratory specimens during the acute phase of infection. The lowest concentration of SARS-CoV-2 viral copies this assay can detect is 138 copies/mL. A negative result does not preclude SARS-Cov-2 infection and should not be used as the sole basis for treatment or other patient management decisions. A negative result may occur with  improper specimen collection/handling, submission of specimen other than nasopharyngeal swab, presence of viral mutation(s) within the areas targeted by this assay, and inadequate number of viral copies(<138 copies/mL). A negative result must be combined with clinical observations, patient  history, and epidemiological information. The expected result is Negative.  Fact Sheet for Patients:  07/12/20  Fact Sheet for Healthcare Providers:  BloggerCourse.com  This test is no t yet approved or cleared by the [place] FDA and  has been authorized for detection and/or diagnosis of SARS-CoV-2 by FDA under an Emergency Use Authorization (EUA). This EUA will remain  in effect (meaning this test can be used) for the duration of the COVID-19 declaration under Section 564(b)(1) of the Act, 21 U.S.C.section 360bbb-3(b)(1), unless the authorization is terminated  or revoked sooner.       Influenza A by PCR NEGATIVE NEGATIVE Final   Influenza B by PCR NEGATIVE NEGATIVE Final    Comment: (NOTE) The Xpert Xpress SARS-CoV-2/FLU/RSV plus assay is intended as an aid in the diagnosis of influenza from Nasopharyngeal swab specimens and should not be  used as a sole basis for treatment. Nasal washings and aspirates are unacceptable for Xpert Xpress SARS-CoV-2/FLU/RSV testing.  Fact Sheet for Patients: BloggerCourse.com  Fact Sheet for Healthcare Providers: SeriousBroker.it  This test is not yet approved or cleared by the [place] FDA and has been authorized for detection and/or diagnosis of SARS-CoV-2 by FDA under an Emergency Use Authorization (EUA). This EUA will remain in effect (meaning this test can be used) for the duration of the COVID-19 declaration under Section 564(b)(1) of the Act, 21 U.S.C. section 360bbb-3(b)(1), unless the authorization is terminated or revoked.  Performed at [facility], [address] [place], [place] [address]   MRSA PCR Screening     Status: None   Collection Time: 05/13/20  2:46 AM   Specimen: Nasopharyngeal  Result Value Ref Range Status   MRSA by PCR NEGATIVE NEGATIVE Final    Comment:        The GeneXpert MRSA  Assay (FDA approved for NASAL specimens only), is one component of a comprehensive MRSA colonization surveillance program. It is not intended to diagnose MRSA infection nor to guide or monitor treatment for MRSA infections. Performed at [facility], [address] [place], [place] [address]          Radiology Studies: No results found.      Scheduled Meds: . arformoterol  15 mcg Nebulization BID  . budesonide (PULMICORT) nebulizer solution  0.5 mg Nebulization BID  . chlorhexidine  15 mL Mouth Rinse BID  . Chlorhexidine Gluconate Cloth  6 each Topical Daily  . DULoxetine  60 mg Oral Daily  . heparin  5,000 Units Subcutaneous Q8H  . insulin aspart  0-9 Units Subcutaneous TID WC  . mouth rinse  15 mL Mouth Rinse q12n4p  . nicotine  14 mg Transdermal Daily  . pantoprazole  40 mg Oral QHS  . pregabalin  200 mg Oral TID  . revefenacin  175 mcg Nebulization Daily  . sodium chloride flush  10 mL Intravenous Q12H  . torsemide  40 mg Oral Daily  . traZODone  25 mg Oral QHS   Continuous Infusions: . sodium chloride 500 mL (05/17/20 0647)  . doxycycline (VIBRAMYCIN) IV 100 mg (05/17/20 9371)     LOS: 4 days    Time spent: 30 mins     Charise Killian, MD [facility] Pager [phone]  If 7PM-7AM, please contact night-coverage 05/17/2020, 7:37 AM

## 2020-05-17 NOTE — TOC Progression Note (Signed)
Transition of Care [facility]) - Progression Note    Patient Details  Name: Gregory Hall MRN: [id number] Date of Birth: 1955-05-15  Transition of Care [facility]) CM/SW Contact  Beverly Sessions, RN Phone Number: 05/17/2020, 3:31 PM  Clinical Narrative:     Met with patient at bedside.  [facility]osp[facility]   I called and spoke with Lawanda the group home directly.  She states they do no accept any hospice residents.  I inquired if it would be an option for patient to return with home health services and palliative as PT is recommending home health. She states that she feels that patient needs a high level of care and she does not feel safe with him returning.   MD to order palliative care consult Patient agreeable to ALF or long term care SNF placement  Patient wears 4L chronic O2, states that he has a nebulizer and bipap.   Melissa from [facility] hospice had already been working with the patient and [facility] to see if that is an option. Dustin from the [place] called and spoke to the patient directly.  He request an Fl2 and PPD or chest xray to r/o TB  Fl2 faxed, MD to order chest xray  Expected Discharge Plan: [place] (with home health)    Expected Discharge Plan and Services Expected Discharge Plan: [place] (with home health)       Living arrangements for the past 2 months: [place]: RN,PT,OT [place] Agency: [place] ([place]) Date [place]: 05/14/20   Representative spoke with at [place]: [place] ([place]) Interventions    Readmission Risk Interventions Readmission Risk Prevention Plan 05/14/2020 02/18/2020 12/15/2019  Transportation Screening Complete Complete Complete  PCP or Specialist Appt within 3-5 Days - - -  HRI or [place] for [place] - - -  Medication Review Press photographer) Complete (No Data) Complete  PCP or Specialist appointment within 3-5 days of discharge - Complete -  HRI or [place] Complete Complete Complete  SW Recovery Care/Counseling Consult - Complete -  Palliative Care Screening Not Applicable Not Applicable Not [place] Not Applicable Not Applicable Not Applicable  Some recent data might be hidden

## 2020-05-18 ENCOUNTER — Encounter: Payer: Self-pay | Admitting: Pulmonary Disease

## 2020-05-18 DIAGNOSIS — Z515 Encounter for palliative care: Secondary | ICD-10-CM

## 2020-05-18 DIAGNOSIS — J9601 Acute respiratory failure with hypoxia: Secondary | ICD-10-CM

## 2020-05-18 DIAGNOSIS — Z7189 Other specified counseling: Secondary | ICD-10-CM

## 2020-05-18 DIAGNOSIS — J449 Chronic obstructive pulmonary disease, unspecified: Secondary | ICD-10-CM

## 2020-05-18 LAB — BASIC METABOLIC PANEL
Anion gap: 10 (ref 5–15)
BUN: 27 mg/dL — ABNORMAL HIGH (ref 8–23)
CO2: 36 mmol/L — ABNORMAL HIGH (ref 22–32)
Calcium: 9.2 mg/dL (ref 8.9–10.3)
Chloride: 92 mmol/L — ABNORMAL LOW (ref 98–111)
Creatinine, Ser: 1.19 mg/dL (ref 0.61–1.24)
GFR, Estimated: >60 mL/min (ref >60)
Glucose, Bld: 135 mg/dL — ABNORMAL HIGH (ref 70–99)
Potassium: 3.7 mmol/L (ref 3.5–5.1)
Sodium: 138 mmol/L (ref 135–145)

## 2020-05-18 LAB — CBC
HCT: 36.9 % — ABNORMAL LOW (ref 39.0–52.0)
Hemoglobin: 11.5 g/dL — ABNORMAL LOW (ref 13.0–17.0)
MCH: 25 pg — ABNORMAL LOW (ref 26.0–34.0)
MCHC: 31.2 g/dL (ref 30.0–36.0)
MCV: 80.2 fL (ref 80.0–100.0)
Platelets: 239 10*3/uL (ref 150–400)
RBC: 4.6 MIL/uL (ref 4.22–5.81)
RDW: 23.2 % — ABNORMAL HIGH (ref 11.5–15.5)
WBC: 8.4 10*3/uL (ref 4.0–10.5)
nRBC: 0 % (ref 0.0–0.2)

## 2020-05-18 LAB — GLUCOSE, CAPILLARY
Glucose-Capillary: 149 mg/dL — ABNORMAL HIGH (ref 70–99)
Glucose-Capillary: 150 mg/dL — ABNORMAL HIGH (ref 70–99)
Glucose-Capillary: 108 mg/dL — ABNORMAL HIGH (ref 70–99)

## 2020-05-18 LAB — MAGNESIUM: Magnesium: 1.8 mg/dL (ref 1.7–2.4)

## 2020-05-18 MED ORDER — ALPRAZOLAM 0.25 MG PO TABS
0.2500 mg | ORAL_TABLET | Freq: Once | ORAL | Status: AC
  Administered 2020-05-18: 0.25 mg via ORAL
  Filled 2020-05-18: qty 1

## 2020-05-18 MED ORDER — DOXYCYCLINE HYCLATE 100 MG PO TABS: 100.0000 mg | ORAL_TABLET | Freq: Two times a day (BID) | ORAL | 0 refills | Status: AC

## 2020-05-18 MED ORDER — OXYCODONE HCL 10 MG PO TABS: 10.0000 mg | ORAL_TABLET | Freq: Four times a day (QID) | ORAL | 0 refills | Status: AC | PRN

## 2020-05-18 NOTE — TOC Transition Note (Signed)
Transition of Care [facility]) - CM/SW Discharge Note   Patient Details  Name: Gregory Hall MRN: [id number] Date of Birth: 13-Apr-1955  Transition of Care [facility]) CM/SW Contact:  Maree Krabbe, LCSW Phone Number: 05/18/2020, 3:42 PM   Clinical Narrative:  [facility] will service pt at [facility]. Pt is dc. Hospice has ordered 02 and it has a 4 hour window to be delivered. DC ppwk completed. RN aware that [facility] will call her when 02 has been delivered and RN to call EMS at [phone]. RN to call report to [phone].     Final next level of care: Assisted Living Barriers to Discharge: No Barriers Identified   Patient Goals and CMS Choice        Discharge Placement              Patient chooses bed at:  ([facility]) Patient to be transferred to facility by: [facility]   Patient and family notified of of transfer: 05/18/20  Discharge Plan and Services                          HH Arranged: RN,PT,OT [facility] Agency: [facility] Date HH Agency Contacted: 05/14/20   Representative spoke with at [facility] Agency: Barbara Cower  Social Determinants of Health (SDOH) Interventions     Readmission Risk Interventions Readmission Risk Prevention Plan 05/14/2020 02/18/2020 12/15/2019  Transportation Screening Complete Complete Complete  PCP or Specialist Appt within 3-5 Days - - -  HRI or Home Care Consult - - -  Social Work Consult for Recovery Care Planning/Counseling - - -  Palliative Care Screening - - -  Medication Review Oceanographer) Complete (No Data) Complete  PCP or Specialist appointment within 3-5 days of discharge - Complete -  HRI or Home Care Consult Complete Complete Complete  SW Recovery Care/Counseling Consult - Complete -  Palliative Care Screening Not Applicable Not Applicable Not Applicable  Skilled Nursing Facility Not Applicable Not Applicable Not Applicable  Some recent data might be hidden

## 2020-05-18 NOTE — Discharge Summary (Addendum)
Physician Discharge Summary  Shown Dissinger ZOX:[id number] DOB: Nov 19, 1955 DOA: 05/12/2020  PCP: Koren Bound, NP  Admit date: 05/12/2020 Discharge date: 05/18/2020  Admitted From: group home  Disposition:  ALF  Recommendations for Outpatient Follow-up:  1. Follow up with PCP in 1-2 weeks 2. F/u w/ pulmon in 1-2 weeks   Home Health: no  Equipment/Devices:  Discharge Condition: stable  CODE STATUS: full  Diet recommendation: Heart Healthy / Carb Modified   Brief/Interim Summary: HPI was taken from Dr. Francine Graven: Nyan Dufresne is a 65 year old male with CAD, COPD, HFpEF, and DMII who presents from [facility] facility with SpO2 of 70% on room air. He was treated with duoneb by EMS and placed on non-rebreather mask with improvement in his SpO2. He was placed on Bipap upon arrival to the ER. He developed progressive mental status changes and an ABG showed pH 7.26, pCO2 99 and pO2 72. He was intubated in the ER.   PCCM has been called for admission due to acute on chronic hypercapnic respiratory failure.   Patient's family not answering their phones and unable to obtain further history from the patient.  Hospital course from Dr. Mayford Knife 5/5-5/10/22: Pt is s/p intubation & extubation for acute respiratory failure likely secondary to polysubstance abuse and COPD. Pt was treated w/ abxs (doxycycline), steroids, bronchodilators. Pt has 2 days more doxycycline to complete the course. Of note, pt has hx of severe COPD. For more information, please see previous progress/consult notes.   Discharge Diagnoses:  Active Problems:   Hypercapnic respiratory failure (HCC)  Acute hypercapnic & hypoxic respiratory failure: likely secondary to polysubstance abuse. S/p intubation & extubation. Continue on supplemental oxygen and wean as tolerated, currently on 3L [place]. Encourage incentive spirometry and continue on bronchodilators   COPD: severe. Continue on bronchodilators, restart home steroid  dose at d/c and encourage incentive spirometry.   Acute toxic encephalopathy: likely secondary to polysubstance abuse. Urine drug screen was positive for barbiturates. Resolved, back to baseline   Likely pneumonia: improving. Continue on bronchodilators, doxycycline. Encourage incentive spirometry   Chronic diastolic CHF: continue on home dose of torsemide. Monitor I/Os  DM2: likely poorly controlled. Continue on SSI w/ accuchecks   Hypokalemia: WNL today    Discharge Instructions  Discharge Instructions    Diet - low sodium heart healthy   Complete by: As directed    Diet Carb Modified   Complete by: As directed    Discharge instructions   Complete by: As directed    F/u w/ PCP in 1-2 weeks. F/u w/ pulmon in 1-2 weeks   Increase activity slowly   Complete by: As directed    No wound care   Complete by: As directed      Allergies as of 05/18/2020      Reactions   Acetaminophen Other (See Comments)   Reaction:  Unknown  Kidney failure Told not to take from home M.D. Related to kidney and renal failure   Fentanyl    Nsaids Other (See Comments)   Reaction:  Unknown  Kidney failure Patient states not to take from home M.D. Related to kidney and renal failure. Other reaction(s): Unknown      Medication List    TAKE these medications   alfuzosin 10 MG 24 hr tablet Commonly known as: UROXATRAL Take 1 tablet (10 mg total) by mouth daily with breakfast.   amiodarone 200 MG tablet Commonly known as: PACERONE Take 1 tablet (200 mg total) by mouth daily.  atorvastatin 40 MG tablet Commonly known as: LIPITOR Take 1 tablet (40 mg total) by mouth daily.   budesonide 0.5 MG/2ML nebulizer solution Commonly known as: PULMICORT Take 0.5 mg by nebulization 2 (two) times daily.   doxycycline 100 MG tablet Commonly known as: VIBRA-TABS Take 1 tablet (100 mg total) by mouth every 12 (twelve) hours for 2 days.   DULoxetine 60 MG capsule Commonly known as: CYMBALTA Take 60  mg by mouth daily.   Ensure Max Protein Liqd Take 330 mLs (11 oz total) by mouth 2 (two) times daily between meals. What changed:   when to take this  additional instructions   glyBURIDE 5 MG tablet Commonly known as: DIABETA Take 5 mg by mouth daily.   guaiFENesin 600 MG 12 hr tablet Commonly known as: MUCINEX Take 1 tablet (600 mg total) by mouth 2 (two) times daily. What changed:   when to take this  reasons to take this   insulin lispro 100 UNIT/ML injection Commonly known as: HUMALOG Inject 4 Units into the skin See admin instructions. Inject 4 units with blood sugar 150-250; inject 6 units 251-350; greater than 351 call provider; Hold for FSBG <150   ipratropium-albuterol 0.5-2.5 (3) MG/3ML Soln Commonly known as: DUONEB Take 3 mLs by nebulization every 6 (six) hours as needed.   lactulose 10 GM/15ML solution Commonly known as: CHRONULAC Take 30 mLs by mouth daily as needed for mild constipation or moderate constipation.   lamoTRIgine 100 MG tablet Commonly known as: LAMICTAL Take 100 mg by mouth daily.   Lantus SoloStar 100 UNIT/ML Solostar Pen Generic drug: insulin glargine Inject 11 Units into the skin daily.   magnesium oxide 400 (241.3 Mg) MG tablet Commonly known as: MAG-OX Take 400 mg by mouth daily.   midodrine 10 MG tablet Commonly known as: PROAMATINE Take 1 tablet (10 mg total) by mouth 2 (two) times daily. Take if top blood pressure reading is less than 100   NAC 600 MG Caps Generic drug: Acetylcysteine Take 600 mg by mouth daily.   Nitrostat 0.4 MG SL tablet Generic drug: nitroGLYCERIN DISSOLVE (1) TABLET UNDER TONGUE AS NEEDED TO RELIEVE CHEST PAIN. MAYREPEAT EVERY 5 MINUTES. What changed: See the new instructions.   ondansetron 4 MG disintegrating tablet Commonly known as: ZOFRAN-ODT Take 1 tablet (4 mg total) by mouth every 8 (eight) hours as needed for nausea or vomiting.   Oxycodone HCl 10 MG Tabs Take 1 tablet (10 mg total) by  mouth 3 (three) times daily. What changed: Another medication with the same name was added. Make sure you understand how and when to take each.   Oxycodone HCl 10 MG Tabs Take 1 tablet (10 mg total) by mouth every 6 (six) hours as needed for up to 1 day for moderate pain or severe pain. What changed: You were already taking a medication with the same name, and this prescription was added. Make sure you understand how and when to take each.   pantoprazole 40 MG tablet Commonly known as: PROTONIX Take 40 mg by mouth daily.   potassium chloride SA 20 MEQ tablet Commonly known as: KLOR-CON Take 10 mEq by mouth daily.   predniSONE 2.5 MG tablet Commonly known as: DELTASONE Take 2.5 mg by mouth daily.   pregabalin 200 MG capsule Commonly known as: LYRICA Take 1 capsule (200 mg total) by mouth 3 (three) times daily.   rivaroxaban 20 MG Tabs tablet Commonly known as: XARELTO Take 20 mg by mouth daily with supper.  torsemide 20 MG tablet Commonly known as: DEMADEX Take 1 tablet (20 mg total) by mouth daily. What changed: how much to take   traZODone 150 MG tablet Commonly known as: DESYREL Take 150 mg by mouth at bedtime.   Trelegy Ellipta 100-62.5-25 MCG/INH Aepb Generic drug: Fluticasone-Umeclidin-Vilant Inhale 1 puff into the lungs daily.   Vitamin D3 50 MCG (2000 UT) Tabs Take 2,000 Units by mouth daily.       Allergies  Allergen Reactions  . Acetaminophen Other (See Comments)    Reaction:  Unknown  Kidney failure Told not to take from home M.D. Related to kidney and renal failure   . Fentanyl   . Nsaids Other (See Comments)    Reaction:  Unknown  Kidney failure Patient states not to take from home M.D. Related to kidney and renal failure.  Other reaction(s): Unknown     ICU   Procedures/Studies: DG Chest 1 View  Result Date: 05/12/2020 CLINICAL DATA:  Hypoxia, COPD, CHF EXAM: CHEST  1 VIEW COMPARISON:  04/07/2020 FINDINGS: Single frontal view of the  chest demonstrates a stable cardiac silhouette. Chronic vascular congestion, with patchy bibasilar consolidation favor atelectasis or scarring. No airspace disease, effusion, or pneumothorax. No acute bony abnormalities. IMPRESSION: 1. Patchy bibasilar consolidation compatible with scarring or atelectasis. 2. No acute airspace disease. Electronically Signed   By: Sharlet Salina M.D.   On: 05/12/2020 22:23   DG Abd 1 View  Result Date: 05/13/2020 CLINICAL DATA:  Orogastric tube placement EXAM: ABDOMEN - 1 VIEW COMPARISON:  02/17/2020 abdominal CT FINDINGS: Enteric tube with tip at the upper stomach and side-port likely at the gastric cardia. Limited coverage of the abdominal bowel gas without obstructive pattern. Lower chest densities as seen on x-ray yesterday. IMPRESSION: Enteric tube with tip at the upper stomach. Electronically Signed   By: Marnee Spring M.D.   On: 05/13/2020 04:48   DG Chest Port 1 View  Result Date: 05/17/2020 CLINICAL DATA:  Rule out TB. EXAM: PORTABLE CHEST 1 VIEW COMPARISON:  May 12, 2020 FINDINGS: The endotracheal tube and nasogastric tube seen on the prior study have been removed. The lungs are hyperinflated. No acute infiltrate, pleural effusion or pneumothorax is identified. The heart size and mediastinal contours are within normal limits. A radiopaque stent is seen overlying the superior mediastinum on the left. Stable spinal stimulator wire positioning is noted. The visualized skeletal structures are unremarkable. IMPRESSION: No acute or active cardiopulmonary disease. Electronically Signed   By: Aram Candela M.D.   On: 05/17/2020 16:38   DG Chest Portable 1 View  Result Date: 05/12/2020 CLINICAL DATA:  Intubation EXAM: PORTABLE CHEST 1 VIEW COMPARISON:  05/12/2020 FINDINGS: Support Apparatus: --Endotracheal tube: Tip at the level of the clavicular heads. --Enteric tube:Tip and sideport project over the stomach. --Catheter(s):None --Other: None Unchanged bibasilar  atelectasis. IMPRESSION: Endotracheal tube tip at the level of the clavicular heads. Electronically Signed   By: Deatra Robinson M.D.   On: 05/12/2020 23:54      Subjective: Pt c/o fatigue    Discharge Exam: Vitals:   05/18/20 0831 05/18/20 1200  BP: (!) 90/56 (!) 105/58  Pulse: 70 76  Resp: 18 18  Temp: 97.8 F (36.6 C) 97.7 F (36.5 C)  SpO2: 90% (!) 89%   Vitals:   05/18/20 0441 05/18/20 0810 05/18/20 0831 05/18/20 1200  BP: 110/73  (!) 90/56 (!) 105/58  Pulse: 73  70 76  Resp: Temp: 97.9 F (36.6 C)  97.8 F (36.6 C) 97.7 F (36.5 C)  TempSrc: Oral     SpO2: 91% 94% 90% (!) 89%  Weight:   90.6 kg   Height:        General: Pt is alert, awake, not in acute distress Cardiovascular: S1/S2 +, no rubs, no gallops Respiratory: diminished breath sounds b/l  Abdominal: Soft, NT, ND, bowel sounds + Extremities: no edema, no cyanosis    The results of significant diagnostics from this hospitalization (including imaging, microbiology, ancillary and laboratory) are listed below for reference.     Microbiology: Recent Results (from the past 240 hour(s))  Resp Panel by RT-PCR (Flu A&B, Covid) Nasopharyngeal Swab     Status: None   Collection Time: 05/12/20 10:29 PM   Specimen: Nasopharyngeal Swab; Nasopharyngeal(NP) swabs in vial transport medium  Result Value Ref Range Status   SARS Coronavirus 2 by RT PCR NEGATIVE NEGATIVE Final    Comment: (NOTE) SARS-CoV-2 target nucleic acids are NOT DETECTED.  The SARS-CoV-2 RNA is generally detectable in upper respiratory specimens during the acute phase of infection. The lowest concentration of SARS-CoV-2 viral copies this assay can detect is 138 copies/mL. A negative result does not preclude SARS-Cov-2 infection and should not be used as the sole basis for treatment or other patient management decisions. A negative result may occur with  improper specimen collection/handling, submission of specimen other than  nasopharyngeal swab, presence of viral mutation(s) within the areas targeted by this assay, and inadequate number of viral copies(<138 copies/mL). A negative result must be combined with clinical observations, patient history, and epidemiological information. The expected result is Negative.  Fact Sheet for Patients:  BloggerCourse.com  Fact Sheet for Healthcare Providers:  SeriousBroker.it  This test is no t yet approved or cleared by the [place] FDA and  has been authorized for detection and/or diagnosis of SARS-CoV-2 by FDA under an Emergency Use Authorization (EUA). This EUA will remain  in effect (meaning this test can be used) for the duration of the COVID-19 declaration under Section 564(b)(1) of the Act, 21 U.S.C.section 360bbb-3(b)(1), unless the authorization is terminated  or revoked sooner.       Influenza A by PCR NEGATIVE NEGATIVE Final   Influenza B by PCR NEGATIVE NEGATIVE Final    Comment: (NOTE) The Xpert Xpress SARS-CoV-2/FLU/RSV plus assay is intended as an aid in the diagnosis of influenza from Nasopharyngeal swab specimens and should not be used as a sole basis for treatment. Nasal washings and aspirates are unacceptable for Xpert Xpress SARS-CoV-2/FLU/RSV testing.  Fact Sheet for Patients: BloggerCourse.com  Fact Sheet for Healthcare Providers: SeriousBroker.it  This test is not yet approved or cleared by the [place] FDA and has been authorized for detection and/or diagnosis of SARS-CoV-2 by FDA under an Emergency Use Authorization (EUA). This EUA will remain in effect (meaning this test can be used) for the duration of the COVID-19 declaration under Section 564(b)(1) of the Act, 21 U.S.C. section 360bbb-3(b)(1), unless the authorization is terminated or revoked.  Performed at [facility], [address] [place], [place]  [address]   MRSA PCR Screening     Status: None   Collection Time: 05/13/20  2:46 AM   Specimen: Nasopharyngeal  Result Value Ref Range Status   MRSA by PCR NEGATIVE NEGATIVE Final    Comment:        The GeneXpert MRSA Assay (FDA approved for NASAL specimens only), is one component of a comprehensive MRSA colonization surveillance program. It is not  intended to diagnose MRSA infection nor to guide or monitor treatment for MRSA infections. Performed at [facility], [address] [place], [place] [address]      Labs: BNP (last 3 results) Recent Labs    03/20/20 1744 04/07/20 2338 05/12/20 2200  BNP 130.5* 26.4 217.2*   Basic Metabolic Panel: Recent Labs  Lab 05/14/20 0353 05/15/20 0519 05/16/20 0452 05/17/20 0455 05/18/20 0349  NA 140 137 139 137 138  K 3.2* 4.0 4.4 4.7 3.7  CL 92* 91* 96* 93* 92*  CO2 37* 35* 33* 36* 36*  GLUCOSE 82 105* 219* 124* 135*  BUN 21 20 21 22  27*  CREATININE 1.11 1.24 1.12 1.21 1.19  CALCIUM 8.3* 8.6* 9.0 9.1 9.2  MG 2.1 1.9 1.9 1.8 1.8  PHOS 2.6  --   --   --   --    Liver Function Tests: No results for input(s): AST, ALT, ALKPHOS, BILITOT, PROT, ALBUMIN in the last 168 hours. No results for input(s): LIPASE, AMYLASE in the last 168 hours. No results for input(s): AMMONIA in the last 168 hours. CBC: Recent Labs  Lab 05/14/20 0353 05/15/20 0519 05/16/20 0452 05/17/20 0455 05/18/20 0349  WBC 10.1 9.8 10.1 11.7* 8.4  HGB 11.1* 11.1* 11.3* 12.6* 11.5*  HCT 36.6* 35.1* 35.7* 39.5 36.9*  MCV 81.0 79.6* 79.2* 80.3 80.2  PLT 254 252 261 267 239   Cardiac Enzymes: No results for input(s): CKTOTAL, CKMB, CKMBINDEX, TROPONINI in the last 168 hours. BNP: Invalid input(s): POCBNP CBG: Recent Labs  Lab 05/17/20 1119 05/17/20 1617 05/17/20 1958 05/18/20 0827 05/18/20 1154  GLUCAP 84 168* 135* 149* 150*   D-Dimer No results for input(s): DDIMER in the last 72 hours. Hgb A1c No results for input(s): HGBA1C in the  last 72 hours. Lipid Profile No results for input(s): CHOL, HDL, LDLCALC, TRIG, CHOLHDL, LDLDIRECT in the last 72 hours. Thyroid function studies No results for input(s): TSH, T4TOTAL, T3FREE, THYROIDAB in the last 72 hours.  Invalid input(s): FREET3 Anemia work up No results for input(s): VITAMINB12, FOLATE, FERRITIN, TIBC, IRON, RETICCTPCT in the last 72 hours. Urinalysis    Component Value Date/Time   COLORURINE YELLOW (A) 05/13/2020 0142   APPEARANCEUR CLEAR (A) 05/13/2020 0142   APPEARANCEUR Clear 10/28/2013 2215   LABSPEC 1.012 05/13/2020 0142   LABSPEC 1.002 10/28/2013 2215   PHURINE 6.0 05/13/2020 0142   GLUCOSEU NEGATIVE 05/13/2020 0142   GLUCOSEU Negative 10/28/2013 2215   HGBUR NEGATIVE 05/13/2020 0142   BILIRUBINUR NEGATIVE 05/13/2020 0142   BILIRUBINUR Negative 10/28/2013 2215   KETONESUR NEGATIVE 05/13/2020 0142   PROTEINUR NEGATIVE 05/13/2020 0142   UROBILINOGEN 1.0 03/29/2009 0840   NITRITE NEGATIVE 05/13/2020 0142   LEUKOCYTESUR NEGATIVE 05/13/2020 0142   LEUKOCYTESUR Negative 10/28/2013 2215   Sepsis Labs Invalid input(s): PROCALCITONIN,  WBC,  LACTICIDVEN Microbiology Recent Results (from the past 240 hour(s))  Resp Panel by RT-PCR (Flu A&B, Covid) Nasopharyngeal Swab     Status: None   Collection Time: 05/12/20 10:29 PM   Specimen: Nasopharyngeal Swab; Nasopharyngeal(NP) swabs in vial transport medium  Result Value Ref Range Status   SARS Coronavirus 2 by RT PCR NEGATIVE NEGATIVE Final    Comment: (NOTE) SARS-CoV-2 target nucleic acids are NOT DETECTED.  The SARS-CoV-2 RNA is generally detectable in upper respiratory specimens during the acute phase of infection. The lowest concentration of SARS-CoV-2 viral copies this assay can detect is 138 copies/mL. A negative result does not preclude SARS-Cov-2 infection and should not be  used as the sole basis for treatment or other patient management decisions. A negative result may occur with  improper  specimen collection/handling, submission of specimen other than nasopharyngeal swab, presence of viral mutation(s) within the areas targeted by this assay, and inadequate number of viral copies(<138 copies/mL). A negative result must be combined with clinical observations, patient history, and epidemiological information. The expected result is Negative.  Fact Sheet for Patients:  BloggerCourse.com  Fact Sheet for Healthcare Providers:  SeriousBroker.it  This test is no t yet approved or cleared by the [place] FDA and  has been authorized for detection and/or diagnosis of SARS-CoV-2 by FDA under an Emergency Use Authorization (EUA). This EUA will remain  in effect (meaning this test can be used) for the duration of the COVID-19 declaration under Section 564(b)(1) of the Act, 21 U.S.C.section 360bbb-3(b)(1), unless the authorization is terminated  or revoked sooner.       Influenza A by PCR NEGATIVE NEGATIVE Final   Influenza B by PCR NEGATIVE NEGATIVE Final    Comment: (NOTE) The Xpert Xpress SARS-CoV-2/FLU/RSV plus assay is intended as an aid in the diagnosis of influenza from Nasopharyngeal swab specimens and should not be used as a sole basis for treatment. Nasal washings and aspirates are unacceptable for Xpert Xpress SARS-CoV-2/FLU/RSV testing.  Fact Sheet for Patients: BloggerCourse.com  Fact Sheet for Healthcare Providers: SeriousBroker.it  This test is not yet approved or cleared by the [place] FDA and has been authorized for detection and/or diagnosis of SARS-CoV-2 by FDA under an Emergency Use Authorization (EUA). This EUA will remain in effect (meaning this test can be used) for the duration of the COVID-19 declaration under Section 564(b)(1) of the Act, 21 U.S.C. section 360bbb-3(b)(1), unless the authorization is terminated or revoked.  Performed at  [facility], [address] [place], [place] [address]   MRSA PCR Screening     Status: None   Collection Time: 05/13/20  2:46 AM   Specimen: Nasopharyngeal  Result Value Ref Range Status   MRSA by PCR NEGATIVE NEGATIVE Final    Comment:        The GeneXpert MRSA Assay (FDA approved for NASAL specimens only), is one component of a comprehensive MRSA colonization surveillance program. It is not intended to diagnose MRSA infection nor to guide or monitor treatment for MRSA infections. Performed at [facility], [address], [place], [place] [address]      Time coordinating discharge: Over 30 minutes  SIGNED:   Charise Killian, MD  [facility] 05/18/2020, 12:33 PM Pager   If 7PM-7AM, please contact night-coverage

## 2020-05-18 NOTE — Care Management Important Message (Signed)
Important Message  Patient Details  Name: Gregory Hall MRN: [id number] Date of Birth: 1955-03-08   Medicare Important Message Given:  Other (see comment)  Discharing with hospice services when ready.  Medicare IM withheld at this time.   [name] 05/18/2020, 8:42 AM

## 2020-05-18 NOTE — [place] FL2 (Signed)
[place] MEDICAID FL2 LEVEL OF CARE SCREENING TOOL     IDENTIFICATION  Patient Name: Gregory Hall Birthdate: 1955/11/25 Sex: male Admission Date (Current Location): 05/12/2020  [place] and [place] Number:  Chiropodist and Address:  [facility], [address] [place], [place] [address]      Provider Number: [id number]  Attending Physician Name and Address:  Charise Killian, MD  Relative Name and Phone Number:  Camelia Eng (sister) [phone]    Current Level of Care: Hospital Recommended Level of Care: Assisted Living Facility Prior Approval Number:    Date Approved/Denied:   PASRR Number: [id number]  Discharge Plan: Other (Comment) (ALF)    Current Diagnoses: Patient Active Problem List   Diagnosis Date Noted  . Hypercapnic respiratory failure (HCC) 05/13/2020  . Altered mental status 04/08/2020  . AMS (altered mental status) 04/08/2020  . Prolonged QT interval 04/08/2020  . Acute metabolic encephalopathy 04/08/2020  . Continuous tobacco abuse 03/20/2020  . AKI (acute kidney injury) (HCC) 02/17/2020  . Diabetes mellitus without complication (HCC)   . Diastolic dysfunction with chronic heart failure (HCC)   . Near syncope   . Acute respiratory failure (HCC) 12/13/2019  . Palliative care by specialist   . DNR (do not resuscitate)   . Acute CHF (congestive heart failure) (HCC) 10/08/2019  . Chronic respiratory failure with hypoxia (HCC) 10/08/2019  . Atrial flutter (HCC)   . Acute on chronic respiratory failure with hypoxia (HCC) 09/13/2019  . Acute diastolic CHF (congestive heart failure) (HCC) 09/12/2019  . NSTEMI (non-ST elevated myocardial infarction) (HCC) 09/12/2019  . Acute kidney failure, unspecified (HCC) 09/12/2019  . Diabetes mellitus type 2, uncontrolled, with complications (HCC) 06/10/2019  . Chronic pain syndrome 06/10/2019  . COPD exacerbation (HCC) 06/10/2019  . COPD with acute exacerbation (HCC)  06/09/2019  . CAD (coronary artery disease) 06/09/2019  . HTN (hypertension) 06/09/2019  . Acute on chronic respiratory failure with hypoxia and hypercapnia (HCC) 06/09/2019  . Anxiety 06/09/2019  . Chronic prescription opiate use 06/09/2019  . Hyperglycemia 06/09/2019  . Subclavian artery stenosis, left (HCC) 08/20/2014  . Rectus diastasis 07/19/2012  . Hernia   . Edema 05/10/2011  . Goals of care, counseling/discussion 10/06/2010  . SMOKER 11/25/2009  . CAROTID BRUIT, RIGHT 11/24/2009  . Chest pain 11/24/2009  . Hyperlipidemia 03/30/2009  . Coronary atherosclerosis 03/30/2009  . HYPERTENSION, HX OF 03/30/2009    Orientation RESPIRATION BLADDER Height & Weight     Self,Time,Situation,Place  Normal Continent Weight: 199 lb 11.2 oz (90.6 kg) Height:  5\' 8"  (172.7 cm)  BEHAVIORAL SYMPTOMS/MOOD NEUROLOGICAL BOWEL NUTRITION STATUS      Continent Diet (carb modified)  AMBULATORY STATUS COMMUNICATION OF NEEDS Skin   Limited Assist Verbally  (skin tear right lower arm,)                       Personal Care Assistance Level of Assistance  Bathing,Dressing,Feeding Bathing Assistance: Independent Feeding assistance: Independent Dressing Assistance: Independent Total Care Assistance: Independent   Functional Limitations Info  Sight,Hearing,Speech Sight Info: Adequate Hearing Info: Adequate Speech Info: Adequate    SPECIAL CARE FACTORS FREQUENCY  PT (By licensed PT),OT (By licensed OT)     PT Frequency: 2x OT Frequency: 2x            Contractures Contractures Info: Not present    Additional Factors Info  Code Status,Allergies Code Status Info: full code Allergies Info: Acetaminophen, Fentanyl, Nsaids     Isolation  Precautions Info: MRSA     Current Medications (05/18/2020):  This is the current hospital active medication list Current Facility-Administered Medications  Medication Dose Route Frequency Provider Last Rate Last Admin  . 0.9 %  sodium chloride  infusion   Intravenous PRN Charise Killian, MD   Stopped at 05/17/20 0730  . ALPRAZolam Prudy Feeler) tablet 0.25 mg  0.25 mg Oral Once Charise Killian, MD      . arformoterol [facility]) nebulizer solution 15 mcg  15 mcg Nebulization BID Melody Comas B, MD   15 mcg at 05/18/20 0810  . budesonide (PULMICORT) nebulizer solution 0.5 mg  0.5 mg Nebulization BID Melody Comas B, MD   0.5 mg at 05/18/20 0810  . chlorhexidine (PERIDEX) 0.12 % solution 15 mL  15 mL Mouth Rinse BID Harlon Ditty D, NP   15 mL at 05/16/20 0819  . Chlorhexidine Gluconate Cloth 2 % PADS 6 each  6 each Topical Daily Judithe Modest, NP   6 each at 05/17/20 1026  . docusate sodium (COLACE) capsule 100 mg  100 mg Oral BID PRN Martina Sinner, MD      . doxycycline (VIBRA-TABS) tablet 100 mg  100 mg Oral Q12H Charise Killian, MD   100 mg at 05/18/20 1053  . DULoxetine (CYMBALTA) DR capsule 60 mg  60 mg Oral Daily Manuela Schwartz, NP   60 mg at 05/18/20 1053  . heparin injection 5,000 Units  5,000 Units Subcutaneous Q8H Martina Sinner, MD   5,000 Units at 05/18/20 [phone]  . hydrALAZINE (APRESOLINE) injection 20 mg  20 mg Intravenous Q6H PRN Charise Killian, MD      . insulin aspart (novoLOG) injection 0-9 Units  0-9 Units Subcutaneous TID WC Charise Killian, MD   2 Units at 05/17/20 1635  . ipratropium-albuterol (DUONEB) 0.5-2.5 (3) MG/3ML nebulizer solution 3 mL  3 mL Nebulization Q4H PRN Charise Killian, MD      . MEDLINE mouth rinse  15 mL Mouth Rinse q12n4p Harlon Ditty D, NP   15 mL at 05/15/20 1530  . nicotine (NICODERM CQ - dosed in mg/24 hours) patch 14 mg  14 mg Transdermal Daily Charise Killian, MD   14 mg at 05/18/20 1053  . oxyCODONE (Oxy IR/ROXICODONE) immediate release tablet 10 mg  10 mg Oral Q6H PRN Charise Killian, MD   10 mg at 05/18/20 1053  . pantoprazole (PROTONIX) EC tablet 40 mg  40 mg Oral QHS Rauer, Robyne Peers, RPH   40 mg at 05/17/20 2226  . polyethylene glycol  (MIRALAX / GLYCOLAX) packet 17 g  17 g Oral Daily PRN Melody Comas B, MD      . pregabalin (LYRICA) capsule 200 mg  200 mg Oral TID Charise Killian, MD   200 mg at 05/18/20 1052  . revefenacin (YUPELRI) nebulizer solution 175 mcg  175 mcg Nebulization Daily Martina Sinner, MD   175 mcg at 05/18/20 0810  . sodium chloride flush (NS) 0.9 % injection 10 mL  10 mL Intravenous Q12H Charise Killian, MD   10 mL at 05/18/20 1147  . torsemide (DEMADEX) tablet 40 mg  40 mg Oral Daily Charise Killian, MD   40 mg at 05/18/20 1053  . traZODone (DESYREL) tablet 25 mg  25 mg Oral QHS Manuela Schwartz, NP   25 mg at 05/17/20 2226     Discharge Medications: Please see discharge summary for a list of discharge medications.  Relevant  Imaging Results:  Relevant Lab Results:   Additional Information SSN:[id number]  Reuel Boom Samyiah Halvorsen, LCSW

## 2020-05-18 NOTE — Progress Notes (Signed)
RN received phone call from [facility] stating that oxygen has been delivered. [facility] RN given update on discharge planning, verbalized understanding. EMS called to arrange transportation services. AVS printed along with other transport documentation and placed with chart.

## 2020-05-18 NOTE — Consult Note (Signed)
Consultation Note Date: 05/18/2020   Patient Name: Gregory Hall  DOB: 1955-06-24  MRN: [id number]  Age / Sex: 65 y.o., male  PCP: Koren Bound, NP Referring Physician: Charise Killian, MD  Reason for Consultation: Establishing goals of care and Psychosocial/spiritual support  HPI/Patient Profile: 65 y.o. male  with past medical history of CAD, COPD, HFpEF, DM 2, resident of group home admitted on 05/12/2020 with acute hypercapnic and hypoxemic respiratory failure, COPD, altered mental status concerning for polypharmacy found to be barbiturate positive.   Clinical Assessment and Goals of Care: I have reviewed medical records including EPIC notes, labs and imaging, received report from  nursing staff, assessed the patient.  Gregory Hall is sitting up in bed.  He greets me making and keeping eye contact.  He is alert and oriented, able to make his needs known.  There is no family at the bedside at this time.  I introduced Palliative Medicine as specialized medical care for people living with serious illness. It focuses on providing relief from the symptoms and stress of a serious illness. The goal is to improve quality of life for both the patient and the family.  [name] tells me that he remembers me from our last meeting.  We discuss diagnosis prognosis, GOC, EOL wishes, disposition and options.  We discussed a brief life review of the patient and then focused on their current illness. The natural disease trajectory and expectations at EOL were discussed.  I attempted to elicit values and goals of care important to the patient.    The difference between aggressive medical intervention and comfort care was considered in light of the patient's goals of care.   Advanced directives, concepts specific to code status, artifical feeding and hydration, and rehospitalization were considered and discussed.  In the  past [name] had been DNR.  I ask if he remembers intubation and extubation.  He tells me that indeed, he does.  He shares that he has been through that twice.  I ask if he would be willing to go through it again.  He considers, but tells me that he is not ready for DNR.  Hospice and Palliative Care services outpatient were explained and offered.  Avik is active with [facility] on an outpatient basis.  Discussed the importance of continued conversation with family and the medical providers regarding overall plan of care and treatment options, ensuring decisions are within the context of the patient's values and GOCs.    Gregory Hall tells me that he is tired of going to so many physicians.  He states that he sees 5 different doctors, many of them once every month or 2.  I have greatly encouraged him to work with Efraim Kaufmann, his [employer] as she can help alleviate some doctor's visits.  He also shares that he would like to take fewer medications.  Again, I encouraged him to share this with Melissa.  Questions and concerns were addressed.   PMT will continue to support holistically.   HCPOA  NEXT OF  KIN -Gregory Hall names his sister, Charletta Cousin as his healthcare surrogate.    SUMMARY OF RECOMMENDATIONS   At this point continue full scope/full code Continue Community hospice care Transitioning to [facility]"   Code Status/Advance Care Planning:  Full code  Symptom Management:   Per hospitalist, no additional needs at this time  Palliative Prophylaxis:   Frequent Pain Assessment and Oral Care  Additional Recommendations (Limitations, Scope, Preferences):  Full Scope Treatment  Psycho-social/Spiritual:   Desire for further Chaplaincy support:no  Additional Recommendations: Caregiving  Support/Resources and Education on Hospice  Prognosis:   < 6 months would be expected based on chronic illness burden.   Discharge Planning: [facility] with [facility]        Primary Diagnoses: Present on Admission: . Hypercapnic respiratory failure (HCC)   I have reviewed the medical record, interviewed the patient and family, and examined the patient. The following aspects are pertinent.  Past Medical History:  Diagnosis Date  . AKI (acute kidney injury) (HCC) 09/12/2019  . CAD (coronary artery disease)    s/p PTCA and stent x2  . Chest pain   . CHF (congestive heart failure) (HCC)   . Chronic pain syndrome   . COPD (chronic obstructive pulmonary disease) (HCC)   . Degenerative cervical disc   . Depression   . Diabetes mellitus without complication (HCC)   . Dyslipidemia   . GERD (gastroesophageal reflux disease)   . Hernia 2014  . Hypertension   . MRSA (methicillin resistant staph aureus) culture positive 2011  . Neuropathy   . Nutcracker esophagus   . Rectus diastasis 07/19/2012   Social History   Socioeconomic History  . Marital status: Single    Spouse name: Not on file  . Number of children: Not on file  . Years of education: Not on file  . Highest education level: Not on file  Occupational History  . Not on file  Tobacco Use  . Smoking status: Current Every Day Smoker    Packs/day: 1.00    Years: 50.00    Pack years: 50.00    Types: Cigarettes    Last attempt to quit: 11/26/2019    Years since quitting: 0.4  . Smokeless tobacco: Never Used  Substance and Sexual Activity  . Alcohol use: No  . Drug use: No  . Sexual activity: Not on file  Other Topics Concern  . Not on file  Social History Narrative  . Not on file   Social Determinants of Health   Financial Resource Strain: Not on file  Food Insecurity: Not on file  Transportation Needs: Not on file  Physical Activity: Not on file  Stress: Not on file  Social Connections: Not on file   Family History  Family history unknown: Yes   Scheduled Meds: . arformoterol  15 mcg Nebulization BID  . budesonide (PULMICORT) nebulizer solution  0.5 mg Nebulization BID  .  chlorhexidine  15 mL Mouth Rinse BID  . Chlorhexidine Gluconate Cloth  6 each Topical Daily  . doxycycline  100 mg Oral Q12H  . DULoxetine  60 mg Oral Daily  . heparin  5,000 Units Subcutaneous Q8H  . insulin aspart  0-9 Units Subcutaneous TID WC  . mouth rinse  15 mL Mouth Rinse q12n4p  . nicotine  14 mg Transdermal Daily  . pantoprazole  40 mg Oral QHS  . pregabalin  200 mg Oral TID  . revefenacin  175 mcg Nebulization Daily  . sodium chloride flush  10  mL Intravenous Q12H  . torsemide  40 mg Oral Daily  . traZODone  25 mg Oral QHS   Continuous Infusions: . sodium chloride Stopped (05/17/20 0730)   PRN Meds:.sodium chloride, docusate sodium, hydrALAZINE, ipratropium-albuterol, oxyCODONE, polyethylene glycol Medications Prior to Admission:  Prior to Admission medications   Medication Sig Start Date End Date Taking? Authorizing Provider  alfuzosin (UROXATRAL) 10 MG 24 hr tablet Take 1 tablet (10 mg total) by mouth daily with breakfast. 04/29/20  Yes Sondra Come, MD  amiodarone (PACERONE) 200 MG tablet Take 1 tablet (200 mg total) by mouth daily. 11/03/19  Yes Visser, Jacquelyn D, PA-C  atorvastatin (LIPITOR) 40 MG tablet Take 1 tablet (40 mg total) by mouth daily. 09/28/19  Yes Lewie Chamber, MD  budesonide (PULMICORT) 0.5 MG/2ML nebulizer solution Take 0.5 mg by nebulization 2 (two) times daily.   Yes [provider]  Cholecalciferol (VITAMIN D3) 50 MCG (2000 UT) TABS Take 2,000 Units by mouth daily.    Yes [provider]  DULoxetine (CYMBALTA) 60 MG capsule Take 60 mg by mouth daily.   Yes [provider]  Ensure Max Protein (ENSURE MAX PROTEIN) LIQD Take 330 mLs (11 oz total) by mouth 2 (two) times daily between meals. Patient taking differently: Take 11 oz by mouth daily. Lunch time 06/14/19  Yes Sheikh, Omair Latif, DO  Fluticasone-Umeclidin-Vilant (TRELEGY ELLIPTA) 100-62.5-25 MCG/INH AEPB Inhale 1 puff into the lungs daily.  10/20/19  Yes [provider]  glyBURIDE (DIABETA) 5 MG tablet Take 5 mg by mouth daily. 04/17/20  Yes [provider]  guaiFENesin (MUCINEX) 600 MG 12 hr tablet Take 1 tablet (600 mg total) by mouth 2 (two) times daily. Patient taking differently: Take 600 mg by mouth daily as needed for cough. 02/21/20  Yes Arnetha Courser, MD  insulin lispro (HUMALOG) 100 UNIT/ML injection Inject 4 Units into the skin See admin instructions. Inject 4 units with blood sugar 150-250; inject 6 units 251-350; greater than 351 call provider; Hold for FSBG <150   Yes [provider]  ipratropium-albuterol (DUONEB) 0.5-2.5 (3) MG/3ML SOLN Take 3 mLs by nebulization every 6 (six) hours as needed. 06/14/19  Yes Sheikh, Omair Latif, DO  lactulose (CHRONULAC) 10 GM/15ML solution Take 30 mLs by mouth daily as needed for mild constipation or moderate constipation. 03/31/20  Yes [provider]  lamoTRIgine (LAMICTAL) 100 MG tablet Take 100 mg by mouth daily.   Yes [provider]  LANTUS SOLOSTAR 100 UNIT/ML Solostar Pen Inject 11 Units into the skin daily. 03/18/20  Yes [provider]  magnesium oxide (MAG-OX) 400 (241.3 Mg) MG tablet Take 400 mg by mouth daily.   Yes [provider]  midodrine (PROAMATINE) 10 MG tablet Take 1 tablet (10 mg total) by mouth 2 (two) times daily. Take if top blood pressure reading is less than 100 02/16/20  Yes Dunn, Ryan M, PA-C  NAC 600 MG CAPS Take 600 mg by mouth daily. 09/08/19  Yes [provider]  NITROSTAT 0.4 MG SL tablet DISSOLVE (1) TABLET UNDER TONGUE AS NEEDED TO RELIEVE CHEST PAIN. MAYREPEAT EVERY 5 MINUTES. Patient taking differently: Place 0.4 mg under the tongue every 5 (five) minutes as needed for chest pain. 10/06/15  Yes Iran Ouch, MD  ondansetron (ZOFRAN-ODT) 4 MG disintegrating tablet Take 1 tablet (4 mg total) by mouth every 8 (eight) hours as needed for nausea or vomiting. 02/21/20  Yes Arnetha Courser, MD  pantoprazole (PROTONIX)  40 MG tablet Take  40 mg by mouth daily.    Yes [provider]  potassium chloride SA (KLOR-CON) 20 MEQ tablet Take 10 mEq by mouth daily. 04/17/20  Yes [provider]  predniSONE (DELTASONE) 2.5 MG tablet Take 2.5 mg by mouth daily. 04/17/20  Yes [provider]  pregabalin (LYRICA) 200 MG capsule Take 1 capsule (200 mg total) by mouth 3 (three) times daily. 09/14/19  Yes Danford, Earl Lites, MD  rivaroxaban (XARELTO) 20 MG TABS tablet Take 20 mg by mouth daily with supper.   Yes [provider]  torsemide (DEMADEX) 20 MG tablet Take 1 tablet (20 mg total) by mouth daily. Patient taking differently: Take 40 mg by mouth daily. 04/27/20 07/26/20 Yes Dunn, Raymon Mutton, PA-C  traZODone (DESYREL) 150 MG tablet Take 150 mg by mouth at bedtime. 01/23/20  Yes [provider]  doxycycline (VIBRA-TABS) 100 MG tablet Take 1 tablet (100 mg total) by mouth every 12 (twelve) hours for 2 days. 05/18/20 05/20/20  Charise Killian, MD  oxyCODONE 10 MG TABS Take 1 tablet (10 mg total) by mouth every 6 (six) hours as needed for up to 1 day for moderate pain or severe pain. 05/18/20 05/19/20  Charise Killian, MD  Oxycodone HCl 10 MG TABS Take 1 tablet (10 mg total) by mouth 3 (three) times daily. Patient not taking: No sig reported 04/09/20   Leatha Gilding, MD   Allergies  Allergen Reactions  . Acetaminophen Other (See Comments)    Reaction:  Unknown  Kidney failure Told not to take from home M.D. Related to kidney and renal failure   . Fentanyl   . Nsaids Other (See Comments)    Reaction:  Unknown  Kidney failure Patient states not to take from home M.D. Related to kidney and renal failure.  Other reaction(s): Unknown   Review of Systems  Unable to perform ROS: Other    Physical Exam Vitals and nursing note reviewed.  Constitutional:      General: He is not in acute distress.    Appearance: He is well-developed. He is not ill-appearing.  HENT:     Head:  Normocephalic and atraumatic.  Cardiovascular:     Rate and Rhythm: Normal rate.  Pulmonary:     Effort: Pulmonary effort is normal. No respiratory distress.  Musculoskeletal:     Right lower leg: No edema.     Left lower leg: No edema.  Skin:    General: Skin is warm and dry.  Neurological:     Mental Status: He is alert and oriented to person, place, and time.  Psychiatric:        Mood and Affect: Mood normal.        Behavior: Behavior normal.     Vital Signs: BP (!) 105/58 (BP Location: Right Arm)   Pulse 76   Temp 97.7 F (36.5 C)   Resp 18   Ht 5\' 8"  (1.727 m)   Wt 90.6 kg   SpO2 (!) 89%   BMI 30.36 kg/m  Pain Scale: 0-10 POSS *See Group Information*: 1-Acceptable,Awake and alert Pain Score: 0-No pain   SpO2: SpO2: (!) 89 % O2 Device:SpO2: (!) 89 % O2 Flow Rate: .O2 Flow Rate (L/min): 3 L/min  IO: Intake/output summary:   Intake/Output Summary (Last 24 hours) at 05/18/2020 1405 Last data filed at 05/18/2020 1330 Gross per 24 hour  Intake 2034 ml  Output 2705 ml  Net -671 ml    LBM: Last BM Date: 05/18/20 Baseline  Weight: Weight: 96 kg Most recent weight: Weight: 90.6 kg     Palliative Assessment/Data:   Flowsheet Rows   Flowsheet Row Most Recent Value  Intake Tab   Referral Department Hospitalist  Unit at Time of Referral Cardiac/Telemetry Unit  Palliative Care Primary Diagnosis Cardiac  Date Notified 05/17/20  Palliative Care Type Return patient Palliative Care  Reason for referral Clarify Goals of Care  Date of Admission 05/12/20  Date first seen by Palliative Care 05/18/20  # of days Palliative referral response time 1 Day(s)  # of days IP prior to Palliative referral 5  Clinical Assessment   Palliative Performance Scale Score 60%  Pain Max last 24 hours Not able to report  Pain Min Last 24 hours Not able to report  Dyspnea Max Last 24 Hours Not able to report  Dyspnea Min Last 24 hours Not able to report  Psychosocial & Spiritual  Assessment   Palliative Care Outcomes       Time In: 1105  Time Out: 1155  Time Total: 50 minutes  Greater than 50%  of this time was spent counseling and coordinating care related to the above assessment and plan.  Signed by: Katheran Awe, NP   Please contact Palliative Medicine Team phone at [phone] for questions and concerns.  For individual provider: See Loretha Stapler

## 2020-05-18 NOTE — Plan of Care (Signed)
  Problem: Health Behavior/Discharge Planning: Goal: Ability to manage health-related needs will improve Outcome: Not Progressing Note: Patient continues to ask for fluids after education.

## 2020-05-19 ENCOUNTER — Ambulatory Visit: Payer: Medicare Other | Admitting: Physician Assistant

## 2020-05-19 ENCOUNTER — Ambulatory Visit: Payer: Self-pay | Admitting: Urology

## 2020-05-25 ENCOUNTER — Other Ambulatory Visit: Payer: Self-pay

## 2020-05-25 ENCOUNTER — Encounter: Payer: Self-pay | Admitting: Family

## 2020-05-25 ENCOUNTER — Ambulatory Visit: Attending: Family | Admitting: Family

## 2020-05-25 VITALS — BP 89/55 | HR 81 | Resp 16 | Ht 72.0 in | Wt 212.0 lb

## 2020-05-25 DIAGNOSIS — E785 Hyperlipidemia, unspecified: Secondary | ICD-10-CM | POA: Insufficient documentation

## 2020-05-25 DIAGNOSIS — E1122 Type 2 diabetes mellitus with diabetic chronic kidney disease: Secondary | ICD-10-CM | POA: Diagnosis not present

## 2020-05-25 DIAGNOSIS — I130 Hypertensive heart and chronic kidney disease with heart failure and stage 1 through stage 4 chronic kidney disease, or unspecified chronic kidney disease: Secondary | ICD-10-CM | POA: Diagnosis not present

## 2020-05-25 DIAGNOSIS — N189 Chronic kidney disease, unspecified: Secondary | ICD-10-CM | POA: Insufficient documentation

## 2020-05-25 DIAGNOSIS — F17210 Nicotine dependence, cigarettes, uncomplicated: Secondary | ICD-10-CM | POA: Diagnosis not present

## 2020-05-25 DIAGNOSIS — J449 Chronic obstructive pulmonary disease, unspecified: Secondary | ICD-10-CM | POA: Diagnosis not present

## 2020-05-25 DIAGNOSIS — Z7951 Long term (current) use of inhaled steroids: Secondary | ICD-10-CM | POA: Insufficient documentation

## 2020-05-25 DIAGNOSIS — Z79899 Other long term (current) drug therapy: Secondary | ICD-10-CM | POA: Insufficient documentation

## 2020-05-25 DIAGNOSIS — Z794 Long term (current) use of insulin: Secondary | ICD-10-CM | POA: Diagnosis not present

## 2020-05-25 DIAGNOSIS — I2510 Atherosclerotic heart disease of native coronary artery without angina pectoris: Secondary | ICD-10-CM | POA: Insufficient documentation

## 2020-05-25 DIAGNOSIS — K219 Gastro-esophageal reflux disease without esophagitis: Secondary | ICD-10-CM | POA: Insufficient documentation

## 2020-05-25 DIAGNOSIS — R609 Edema, unspecified: Secondary | ICD-10-CM | POA: Diagnosis not present

## 2020-05-25 DIAGNOSIS — I5032 Chronic diastolic (congestive) heart failure: Secondary | ICD-10-CM | POA: Diagnosis not present

## 2020-05-25 DIAGNOSIS — R5383 Other fatigue: Secondary | ICD-10-CM | POA: Insufficient documentation

## 2020-05-25 DIAGNOSIS — R0602 Shortness of breath: Secondary | ICD-10-CM | POA: Insufficient documentation

## 2020-05-25 DIAGNOSIS — Z7952 Long term (current) use of systemic steroids: Secondary | ICD-10-CM | POA: Insufficient documentation

## 2020-05-25 DIAGNOSIS — G8929 Other chronic pain: Secondary | ICD-10-CM | POA: Diagnosis not present

## 2020-05-25 DIAGNOSIS — G479 Sleep disorder, unspecified: Secondary | ICD-10-CM | POA: Insufficient documentation

## 2020-05-25 DIAGNOSIS — N181 Chronic kidney disease, stage 1: Secondary | ICD-10-CM

## 2020-05-25 DIAGNOSIS — F32A Depression, unspecified: Secondary | ICD-10-CM | POA: Diagnosis not present

## 2020-05-25 DIAGNOSIS — Z9981 Dependence on supplemental oxygen: Secondary | ICD-10-CM | POA: Insufficient documentation

## 2020-05-25 DIAGNOSIS — I10 Essential (primary) hypertension: Secondary | ICD-10-CM

## 2020-05-25 DIAGNOSIS — Z886 Allergy status to analgesic agent status: Secondary | ICD-10-CM | POA: Insufficient documentation

## 2020-05-25 DIAGNOSIS — R059 Cough, unspecified: Secondary | ICD-10-CM | POA: Insufficient documentation

## 2020-05-25 NOTE — Patient Instructions (Addendum)
Begin weighing daily and call for an overnight weight gain of > 2 pounds or a weekly weight gain of >5 pounds. 

## 2020-05-25 NOTE — Progress Notes (Signed)
Patient ID: Gregory Hall, male    DOB: [birth year], 65 y.o.   MRN: [id number]   Gregory Hall is a 65 y/o male with a history of CAD, DM, HTN, CKD, dyslipidemia, GERD, COPD, depression, current tobacco use and chronic heart failure.   Echo report from 03/21/20 reviewed and showed an EF of 60-65%. Echo report from 09/26/19 reviewed and showed an EF of 55-60%.  Admitted 05/12/20 due to hypercapnic respiratory failure. Placed on bipap but then subsequently needed to be intubated but then able to be extubated. Treated with antibiotics, steroids and bronchodilators. Continues on oxygen. Palliative care consult placed. Discharged after 6 days. Admitted 04/07/20 due to AMS. Given narcan drip and ABG showed hypercarbia and AKI. Thought to be due to narcotic use and nonadherence to bipap. Placed on antibiotics and steroids. Narcotics decreased. Diuretics were held short term. Discharged after 3 days.                He presents today for a follow-up visit with a chief complaint of moderate fatigue with little exertion. He describes this chronic in nature having been present for several years. He has associated cough, shortness of breath, wheezing, pedal edema, easy bruising, chronic pain and difficulty sleeping due to his pain. He denies any dizziness, abdominal distention, palpitations or chest pain.   He says that he's not getting weighed daily and can't tell me how often he does get weighed. When asked if he elevates his legs, he first says yes but then says that he sits in a chair with his legs down. Reports being quite sleepy this morning because he didn't sleep well last night due to his chronic pain.   Past Medical History:  Diagnosis Date  . AKI (acute kidney injury) (HCC) 09/12/2019  . CAD (coronary artery disease)    s/p PTCA and stent x2  . Chest pain   . CHF (congestive heart failure) (HCC)   . Chronic pain syndrome   . COPD (chronic obstructive pulmonary disease) (HCC)   . Degenerative  cervical disc   . Depression   . Diabetes mellitus without complication (HCC)   . Dyslipidemia   . GERD (gastroesophageal reflux disease)   . Hernia 2014  . Hypertension   . MRSA (methicillin resistant staph aureus) culture positive 2011  . Neuropathy   . Nutcracker esophagus   . Rectus diastasis 07/19/2012   Past Surgical History:  Procedure Laterality Date  . BACK SURGERY  2012  . CARDIOVERSION N/A 09/26/2019   Procedure: CARDIOVERSION;  Surgeon: Antonieta Iba, MD;  Location: [facility] ORS;  Service: Cardiovascular;  Laterality: N/A;  . CHOLECYSTECTOMY    . COLONOSCOPY  Jan 2014   [name]  . CORONARY ANGIOPLASTY WITH STENT PLACEMENT  2009   stents x2, in [place], [place]  . FOOT SURGERY     Right  . NECK SURGERY    . SPINE SURGERY  2012,2013  . TONSILLECTOMY     Family History  Family history unknown: Yes   Social History   Tobacco Use  . Smoking status: Current Every Day Smoker    Packs/day: 1.00    Years: 50.00    Pack years: 50.00    Types: Cigarettes    Last attempt to quit: 11/26/2019    Years since quitting: 0.4  . Smokeless tobacco: Never Used  Substance Use Topics  . Alcohol use: No   Allergies  Allergen Reactions  . Acetaminophen Other (See Comments)    Reaction:  Unknown  Kidney failure Told not to take from home M.D. Related to kidney and renal failure   . Fentanyl   . Nsaids Other (See Comments)    Reaction:  Unknown  Kidney failure Patient states not to take from home M.D. Related to kidney and renal failure.  Other reaction(s): Unknown   Prior to Admission medications   Medication Sig Start Date End Date Taking? Authorizing Provider  alfuzosin (UROXATRAL) 10 MG 24 hr tablet Take 1 tablet (10 mg total) by mouth daily with breakfast. 04/29/20  Yes Sondra Come, MD  amiodarone (PACERONE) 200 MG tablet Take 1 tablet (200 mg total) by mouth daily. 11/03/19  Yes Visser, Jacquelyn D, PA-C  atorvastatin (LIPITOR) 40 MG tablet Take 1 tablet (40 mg  total) by mouth daily. 09/28/19  Yes Lewie Chamber, MD  budesonide (PULMICORT) 0.5 MG/2ML nebulizer solution Take 0.5 mg by nebulization 2 (two) times daily.   Yes [provider]  Cholecalciferol (VITAMIN D3) 50 MCG (2000 UT) TABS Take 2,000 Units by mouth daily.    Yes [provider]  DULoxetine (CYMBALTA) 60 MG capsule Take 60 mg by mouth daily.   Yes [provider]  Ensure Max Protein (ENSURE MAX PROTEIN) LIQD Take 330 mLs (11 oz total) by mouth 2 (two) times daily between meals. Patient taking differently: Take 11 oz by mouth daily. Lunch time 06/14/19  Yes Sheikh, Omair Latif, DO  Fluticasone-Umeclidin-Vilant (TRELEGY ELLIPTA) 100-62.5-25 MCG/INH AEPB Inhale 1 puff into the lungs daily.  10/20/19  Yes [provider]  glyBURIDE (DIABETA) 5 MG tablet Take 5 mg by mouth daily. 04/17/20  Yes [provider]  guaiFENesin (MUCINEX) 600 MG 12 hr tablet Take 1 tablet (600 mg total) by mouth 2 (two) times daily. Patient taking differently: Take 600 mg by mouth daily as needed for cough. 02/21/20  Yes Arnetha Courser, MD  insulin lispro (HUMALOG) 100 UNIT/ML injection Inject 4 Units into the skin See admin instructions. Inject 4 units with blood sugar 150-250; inject 6 units 251-350; greater than 351 call provider; Hold for FSBG <150   Yes [provider]  ipratropium-albuterol (DUONEB) 0.5-2.5 (3) MG/3ML SOLN Take 3 mLs by nebulization every 6 (six) hours as needed. 06/14/19  Yes Sheikh, Omair Latif, DO  lactulose (CHRONULAC) 10 GM/15ML solution Take 30 mLs by mouth daily as needed for mild constipation or moderate constipation. 03/31/20  Yes [provider]  lamoTRIgine (LAMICTAL) 100 MG tablet Take 100 mg by mouth daily.   Yes [provider]  LANTUS SOLOSTAR 100 UNIT/ML Solostar Pen Inject 11 Units into the skin daily. 03/18/20  Yes [provider]  LORazepam (ATIVAN) 1 MG tablet Take 1 mg by mouth every 4 (four) hours as needed  for anxiety.   Yes [provider]  magnesium oxide (MAG-OX) 400 (241.3 Mg) MG tablet Take 400 mg by mouth daily.   Yes [provider]  midodrine (PROAMATINE) 10 MG tablet Take 1 tablet (10 mg total) by mouth 2 (two) times daily. Take if top blood pressure reading is less than 100 02/16/20  Yes Dunn, Ryan M, PA-C  NAC 600 MG CAPS Take 600 mg by mouth daily. 09/08/19  Yes [provider]  NITROSTAT 0.4 MG SL tablet DISSOLVE (1) TABLET UNDER TONGUE AS NEEDED TO RELIEVE CHEST PAIN. MAYREPEAT EVERY 5 MINUTES. Patient taking differently: Place 0.4 mg under the tongue every 5 (five) minutes as needed for chest pain. 10/06/15  Yes Iran Ouch, MD  ondansetron (ZOFRAN-ODT) 4  MG disintegrating tablet Take 1 tablet (4 mg total) by mouth every 8 (eight) hours as needed for nausea or vomiting. 02/21/20  Yes Arnetha Courser, MD  Oxycodone HCl 10 MG TABS Take 10 mg by mouth 3 (three) times daily.   Yes [provider]  pantoprazole (PROTONIX) 40 MG tablet Take 40 mg by mouth daily.    Yes [provider]  potassium chloride SA (KLOR-CON) 20 MEQ tablet Take 10 mEq by mouth daily. 04/17/20  Yes [provider]  predniSONE (DELTASONE) 2.5 MG tablet Take 2.5 mg by mouth daily. 04/17/20  Yes [provider]  pregabalin (LYRICA) 200 MG capsule Take 1 capsule (200 mg total) by mouth 3 (three) times daily. 09/14/19  Yes Danford, Earl Lites, MD  rivaroxaban (XARELTO) 20 MG TABS tablet Take 20 mg by mouth daily with supper.   Yes [provider]  torsemide (DEMADEX) 20 MG tablet Take 1 tablet (20 mg total) by mouth daily. 04/27/20 07/26/20 Yes Dunn, Raymon Mutton, PA-C  traZODone (DESYREL) 150 MG tablet Take 150 mg by mouth at bedtime. 01/23/20  Yes [provider]    Review of Systems  Constitutional: Positive for fatigue (easily). Negative for appetite change.  HENT: Negative for congestion, postnasal drip and sore throat.   Eyes: Negative.    Respiratory: Positive for cough (dry cough), shortness of breath and wheezing. Negative for choking and chest tightness.   Cardiovascular: Positive for leg swelling. Negative for chest pain and palpitations.  Gastrointestinal: Negative for abdominal distention and abdominal pain.  Endocrine: Negative.   Genitourinary: Negative.   Musculoskeletal: Positive for back pain and neck pain.  Skin: Negative.   Allergic/Immunologic: Negative.   Neurological: Negative for dizziness, weakness, light-headedness and headaches.  Hematological: Negative for adenopathy. Bruises/bleeds easily.  Psychiatric/Behavioral: Positive for sleep disturbance (difficulty sleeping due to pain). Negative for dysphoric mood. The patient is not nervous/anxious.    Vitals:   05/25/20 0958  BP: (!) 89/55  Pulse: 81  Resp: 16  SpO2: 91%  Weight: 212 lb (96.2 kg)  Height: 6' (1.829 m)   Wt Readings from Last 3 Encounters:  05/25/20 212 lb (96.2 kg)  05/18/20 199 lb 11.2 oz (90.6 kg)  04/29/20 212 lb (96.2 kg)   Lab Results  Component Value Date   CREATININE 1.19 05/18/2020   CREATININE 1.21 05/17/2020   CREATININE 1.12 05/16/2020    Physical Exam Vitals and nursing note reviewed.  Constitutional:      Appearance: Normal appearance.  HENT:     Head: Normocephalic and atraumatic.  Cardiovascular:     Rate and Rhythm: Normal rate and regular rhythm.  Pulmonary:     Effort: Pulmonary effort is normal. No respiratory distress.     Breath sounds: Wheezing (expiratory in bilateral lower lobes) present. No rales.  Abdominal:     General: There is no distension.     Palpations: Abdomen is soft.     Tenderness: There is no abdominal tenderness.  Musculoskeletal:        General: No tenderness.     Cervical back: Normal range of motion and neck supple.     Right lower leg: Edema (2+ pitting) present.     Left lower leg: Edema (2+ pitting) present.  Skin:    General: Skin is warm and dry.  Neurological:      General: No focal deficit present.     Mental Status: He is alert and oriented to person, place, and time.  Psychiatric:  Mood and Affect: Mood normal.        Behavior: Behavior normal.    Assessment & Plan:  1: Chronic heart failure with preserved ejection fraction without structural changes- - NYHA class III - euvolemic today - order written for him to be weighed daily and to call for an overnight weight gain of >2 pounds or a weekly weight gain of >5 pounds - concerned about safety of patient standing on scale so stated weight given - rarely adds salt to his food - saw cardiology Michaelle Birks) 03/12/20 - order written for compression socks to be put on every morning with removal at bedtime; also encouraged him to elevate his legs when sitting for long periods of time - BNP 05/12/20 was 217.2  2: HTN- - BP low here today and is currently receiving midodrine twice daily at facility - seeing PCP at [facility] 05/18/20 reviewed and showed sodium 138, potassium 3.7, creatinine 1.19 and GFR  >60  3: DM- - A1c 03/21/20 was 7.8% - facility glucose was 90 today  4: COPD- - saw pulmonology Karna Christmas) 03/01/20 - wearing oxygen at 3 L around the clock    Group home medication list was reviewed.  Return in 3 months or sooner for any questions/problems before then.

## 2020-06-30 ENCOUNTER — Ambulatory Visit: Payer: Self-pay | Admitting: Urology

## 2020-07-09 DEATH — deceased

## 2020-08-25 ENCOUNTER — Ambulatory Visit: Payer: Medicare Other | Admitting: Family

## 2020-10-05 ENCOUNTER — Ambulatory Visit: Payer: Medicare Other | Admitting: Cardiovascular Disease
